# Patient Record
Sex: Female | Born: 1960 | Race: White | Hispanic: No | Smoking: Current some day smoker
Health system: Southern US, Community
[De-identification: ages and names within clinical notes are randomized; demographics above are authoritative.]

## PROBLEM LIST (undated history)

## (undated) DIAGNOSIS — C259 Malignant neoplasm of pancreas, unspecified: Secondary | ICD-10-CM

## (undated) DIAGNOSIS — G709 Myoneural disorder, unspecified: Secondary | ICD-10-CM

## (undated) DIAGNOSIS — F1910 Other psychoactive substance abuse, uncomplicated: Secondary | ICD-10-CM

## (undated) DIAGNOSIS — F419 Anxiety disorder, unspecified: Secondary | ICD-10-CM

## (undated) DIAGNOSIS — F1010 Alcohol abuse, uncomplicated: Secondary | ICD-10-CM

## (undated) DIAGNOSIS — B019 Varicella without complication: Secondary | ICD-10-CM

## (undated) DIAGNOSIS — F32A Depression, unspecified: Secondary | ICD-10-CM

## (undated) DIAGNOSIS — F329 Major depressive disorder, single episode, unspecified: Secondary | ICD-10-CM

## (undated) HISTORY — DX: Anxiety disorder, unspecified: F41.9

## (undated) HISTORY — DX: Varicella without complication: B01.9

## (undated) HISTORY — DX: Myoneural disorder, unspecified: G70.9

## (undated) HISTORY — DX: Major depressive disorder, single episode, unspecified: F32.9

## (undated) HISTORY — PX: TONSILLECTOMY: SUR1361

## (undated) HISTORY — DX: Depression, unspecified: F32.A

---

## 2001-12-27 ENCOUNTER — Encounter: Payer: Self-pay | Admitting: Internal Medicine

## 2001-12-27 ENCOUNTER — Encounter: Admission: RE | Admit: 2001-12-27 | Discharge: 2001-12-27 | Payer: Self-pay | Admitting: Internal Medicine

## 2002-01-31 ENCOUNTER — Encounter: Admission: RE | Admit: 2002-01-31 | Discharge: 2002-01-31 | Payer: Self-pay | Admitting: Family Medicine

## 2002-01-31 ENCOUNTER — Encounter: Payer: Self-pay | Admitting: Family Medicine

## 2007-01-12 ENCOUNTER — Ambulatory Visit (HOSPITAL_COMMUNITY): Admission: RE | Admit: 2007-01-12 | Discharge: 2007-01-12 | Payer: Self-pay | Admitting: Obstetrics and Gynecology

## 2009-05-01 ENCOUNTER — Ambulatory Visit: Payer: Self-pay | Admitting: Diagnostic Radiology

## 2009-05-01 ENCOUNTER — Emergency Department (HOSPITAL_BASED_OUTPATIENT_CLINIC_OR_DEPARTMENT_OTHER): Admission: EM | Admit: 2009-05-01 | Discharge: 2009-05-01 | Payer: Self-pay | Admitting: Emergency Medicine

## 2010-01-23 ENCOUNTER — Emergency Department (HOSPITAL_COMMUNITY)
Admission: EM | Admit: 2010-01-23 | Discharge: 2010-01-24 | Payer: Self-pay | Source: Home / Self Care | Admitting: Emergency Medicine

## 2010-04-07 LAB — CBC
HCT: 44.2 % (ref 36.0–46.0)
Hemoglobin: 14.6 g/dL (ref 12.0–15.0)
MCH: 33.2 pg (ref 26.0–34.0)
MCHC: 33 g/dL (ref 30.0–36.0)
MCV: 100.5 fL — ABNORMAL HIGH (ref 78.0–100.0)
Platelets: 485 10*3/uL — ABNORMAL HIGH (ref 150–400)
RBC: 4.4 MIL/uL (ref 3.87–5.11)
RDW: 12.8 % (ref 11.5–15.5)
WBC: 9.8 10*3/uL (ref 4.0–10.5)

## 2010-04-07 LAB — COMPREHENSIVE METABOLIC PANEL
ALT: 34 U/L (ref 0–35)
AST: 35 U/L (ref 0–37)
CO2: 25 mEq/L (ref 19–32)
Calcium: 10.6 mg/dL — ABNORMAL HIGH (ref 8.4–10.5)
Creatinine, Ser: 0.77 mg/dL (ref 0.4–1.2)
GFR calc Af Amer: >60 mL/min (ref >60)
GFR calc non Af Amer: >60 mL/min (ref >60)
Sodium: 139 mEq/L (ref 135–145)
Total Protein: 7.6 g/dL (ref 6.0–8.3)

## 2010-04-07 LAB — URINALYSIS, ROUTINE W REFLEX MICROSCOPIC
Bilirubin Urine: NEGATIVE
Hgb urine dipstick: NEGATIVE
Nitrite: NEGATIVE
Protein, ur: NEGATIVE mg/dL
Urobilinogen, UA: 0.2 mg/dL (ref 0.0–1.0)

## 2010-04-07 LAB — DIFFERENTIAL
Eosinophils Absolute: 0 10*3/uL (ref 0.0–0.7)
Eosinophils Relative: 0 % (ref 0–5)
Lymphocytes Relative: 22 % (ref 12–46)
Lymphs Abs: 2.2 10*3/uL (ref 0.7–4.0)
Monocytes Relative: 8 % (ref 3–12)

## 2010-04-07 LAB — RAPID URINE DRUG SCREEN, HOSP PERFORMED
Amphetamines: NOT DETECTED
Barbiturates: NOT DETECTED
Benzodiazepines: POSITIVE — AB
Opiates: NOT DETECTED
Tetrahydrocannabinol: POSITIVE — AB

## 2010-04-07 LAB — ETHANOL: Alcohol, Ethyl (B): <5 mg/dL (ref 0–10)

## 2010-04-16 LAB — URINALYSIS, ROUTINE W REFLEX MICROSCOPIC
Bilirubin Urine: NEGATIVE
Glucose, UA: NEGATIVE mg/dL
Ketones, ur: NEGATIVE mg/dL
Protein, ur: NEGATIVE mg/dL
pH: 6 (ref 5.0–8.0)

## 2010-04-16 LAB — URINE MICROSCOPIC-ADD ON

## 2015-01-04 ENCOUNTER — Encounter: Payer: Self-pay | Admitting: Family Medicine

## 2015-01-04 ENCOUNTER — Ambulatory Visit (INDEPENDENT_AMBULATORY_CARE_PROVIDER_SITE_OTHER): Payer: Self-pay | Admitting: Family Medicine

## 2015-01-04 VITALS — BP 129/93 | HR 106 | Temp 98.6°F | Resp 20 | Ht 63.25 in | Wt 144.8 lb

## 2015-01-04 DIAGNOSIS — R053 Chronic cough: Secondary | ICD-10-CM

## 2015-01-04 DIAGNOSIS — J209 Acute bronchitis, unspecified: Secondary | ICD-10-CM | POA: Insufficient documentation

## 2015-01-04 DIAGNOSIS — Z720 Tobacco use: Secondary | ICD-10-CM | POA: Insufficient documentation

## 2015-01-04 DIAGNOSIS — F418 Other specified anxiety disorders: Secondary | ICD-10-CM | POA: Insufficient documentation

## 2015-01-04 DIAGNOSIS — Z7189 Other specified counseling: Secondary | ICD-10-CM

## 2015-01-04 DIAGNOSIS — F411 Generalized anxiety disorder: Secondary | ICD-10-CM

## 2015-01-04 DIAGNOSIS — Z7689 Persons encountering health services in other specified circumstances: Secondary | ICD-10-CM | POA: Insufficient documentation

## 2015-01-04 DIAGNOSIS — R05 Cough: Secondary | ICD-10-CM

## 2015-01-04 DIAGNOSIS — R059 Cough, unspecified: Secondary | ICD-10-CM | POA: Insufficient documentation

## 2015-01-04 MED ORDER — PREDNISONE 50 MG PO TABS: 50.0000 mg | ORAL_TABLET | Freq: Every day | ORAL | Status: DC

## 2015-01-04 MED ORDER — DOXYCYCLINE HYCLATE 100 MG PO TABS: 100.0000 mg | ORAL_TABLET | Freq: Two times a day (BID) | ORAL | Status: DC

## 2015-01-04 NOTE — Progress Notes (Signed)
Subjective:    Patient ID: Kathryn Underwood, female    DOB: Mar 09, 1960, 54 y.o.   MRN: [id number]  HPI  Patient presents for new patient establishment with complaints of chronic cough. All past medical history, surgical history, allergies, family history, immunizations and social history was obtained from the patient today and entered into the electronic medical record. Records are requested from her prior PCP, and will be reviewed at the time they are received. All medical records will be updated at that time.  No records were reviewed or available prior to OV. Has not seen PCP in many years. Pt will need to return to office for preventive/CPE.   Health maintenance:  Colonoscopy: No colonoscopy, no FHX. Interested in cologuard. Mammogram: mammogram 2008 abnormal, pt states she had additional imaging but there is no records. Cervical cancer screening: last PAP 2012, 1 abnl pap.  Immunizations: tdap 2008, flu shot 2015, no PNA vacs.  Infectious disease screening: Unknown. Declined 2/2 to no insurance.   Chronic cough:started with grandson came home from kindergarten sick in August. Now smoking more as well. Mucinex DM, robitussin DM and no resolution. Now back on Wellbutrin, and that has helped her quit in the past. No fever, chills, sore throat, nasal drip and congestion. Mucinex once a day. Use to get bronchitis in 30's, but has not had it in a while.  Decreased appetite since ill.  Golden  productive cough   Past Medical History  Diagnosis Date  . Depression   . Anxiety   . Neuromuscular disorder (HCC)     neuropathy  . Chickenpox    No Known Allergies Past Surgical History  Procedure Laterality Date  . Cesarean section    . Tonsillectomy     Family History  Problem Relation Age of Onset  . Dementia Mother   . Arthritis Father   . Asthma Father   . Asthma Brother   . Diabetes Maternal Grandmother   . Dementia Maternal Grandmother   . Diabetes Paternal Grandmother   .  Breast cancer Neg Hx   . Colon cancer Neg Hx   . Parkinson's disease Maternal Grandfather   . Parkinson's disease Paternal Grandfather    Social History   Social History  . Marital Status: Divorced    Spouse Name: N/A  . Number of Children: N/A  . Years of Education: N/A   Occupational History  . Not on file.   Social History Main Topics  . Smoking status: Current Every Day Smoker -- 0.50 packs/day    Types: Cigarettes  . Smokeless tobacco: Never Used  . Alcohol Use: 4.8 oz/week    8 Glasses of wine per week     Comment: daily  . Drug Use: No  . Sexual Activity: Yes     Comment: female partner   Other Topics Concern  . Not on file   Social History Narrative   Divorced. Homemaker. HS grad.    Lives with life partner (stan)   Drinks caffeine.   Wears seatbelt. Smoke dectector in the home. No firearms in the home.    Feels safe in her relationships.       Review of Systems Negative, with the exception of above mentioned in HPI     Objective:   Physical Exam BP 129/93 mmHg  Pulse 106  Temp(Src) 98.6 F (37 C) (Oral)  Resp 20  Ht 5' 3.25" (1.607 m)  Wt 144 lb 12 oz (65.658 kg)  BMI 25.42 kg/m2  SpO2  95% Gen: Afebrile. No acute distress. Nontoxic in appearance, well-developed, well-nourished, Caucasian female. Smoker's cough present. HENT: AT. [place]. Bilateral TM visualized shiny/full. MMM, no oral lesions. Bilateral nares with erythematous swelling. Throat without erythema or exudates. Cobblestoning present. Harsh cough present. No hoarseness on exam. No tenderness to palpation facial sinuses. Eyes:Pupils Equal Round Reactive to light, Extraocular movements intact,  Conjunctiva without redness, discharge or icterus. Neck/lymp/endocrine: Supple, mild anterior cervical lymphadenopathy, no thyromegaly CV: RRR , no lower extremity edema Chest: CTAB, no wheeze or crackles, good air movement, normal respiratory effort Abd: Soft. Flat. NTND. BS present. No Masses palpated.    MSK: No erythema, soft tissue swelling or obvious deformities. Full range of motion. Skin: No rashes, purpura or petechiae.  Neuro:  Normal gait. PERLA. EOMi. Alert. Oriented. Cranial nerves II through XII intact. Muscle strength 5/5 upper and lower extremity. DTRs equal bilaterally. Psych: Normal affect, dress and demeanor. Normal speech. Normal thought content and judgment..      Assessment & Plan:  Kathryn Underwood is a 54 y.o. female presents for establishment of care with complaints of acute on chronic cough.  - Pt will meed appt once she has insurance for CPE and labs, ans discussion on health maintenance and catch up on immunizations.  - Needs mammogram, no insurance. Will attempt to apply her for a scholarship for mammograms.  - Needs colo-guard screening or colonoscopy, will wait unti insurance coverage - anxiety: continue with psych.  Chronic cough, acute on chronic bronchitis/Tobacco abuse:  - Smoking cessation provided. Encouraged to quit.  - Antibiotic, flonase, steroids, antihistamine.  - predniSONE (DELTASONE) 50 MG tablet; Take 1 tablet (50 mg total) by mouth daily with breakfast.  Dispense: 5 tablet; Refill: 0 - doxycycline (VIBRA-TABS) 100 MG tablet; Take 1 tablet (100 mg total) by mouth 2 (two) times daily.  Dispense: 20 tablet; Refill: 0

## 2015-01-04 NOTE — Patient Instructions (Signed)
Over the counter meds: Antihistamine (zyrtec or allegra) , flonase, mucinex prednisone burst and doxycycline prescribed.  Follow up for preventive/physical once when you get insurance  Acute Bronchitis Bronchitis is inflammation of the airways that extend from the windpipe into the lungs (bronchi). The inflammation often causes mucus to develop. This leads to a cough, which is the most common symptom of bronchitis.  In acute bronchitis, the condition usually develops suddenly and goes away over time, usually in a couple weeks. Smoking, allergies, and asthma can make bronchitis worse. Repeated episodes of bronchitis may cause further lung problems.  CAUSES Acute bronchitis is most often caused by the same virus that causes a cold. The virus can spread from person to person (contagious) through coughing, sneezing, and touching contaminated objects. SIGNS AND SYMPTOMS   Cough.   Fever.   Coughing up mucus.   Body aches.   Chest congestion.   Chills.   Shortness of breath.   Sore throat.  DIAGNOSIS  Acute bronchitis is usually diagnosed through a physical exam. Your health care provider will also ask you questions about your medical history. Tests, such as chest X-rays, are sometimes done to rule out other conditions.  TREATMENT  Acute bronchitis usually goes away in a couple weeks. Oftentimes, no medical treatment is necessary. Medicines are sometimes given for relief of fever or cough. Antibiotic medicines are usually not needed but may be prescribed in certain situations. In some cases, an inhaler may be recommended to help reduce shortness of breath and control the cough. A cool mist vaporizer may also be used to help thin bronchial secretions and make it easier to clear the chest.  HOME CARE INSTRUCTIONS  Get plenty of rest.   Drink enough fluids to keep your urine clear or pale yellow (unless you have a medical condition that requires fluid restriction). Increasing fluids  may help thin your respiratory secretions (sputum) and reduce chest congestion, and it will prevent dehydration.   Take medicines only as directed by your health care provider.  If you were prescribed an antibiotic medicine, finish it all even if you start to feel better.  Avoid smoking and secondhand smoke. Exposure to cigarette smoke or irritating chemicals will make bronchitis worse. If you are a smoker, consider using nicotine gum or skin patches to help control withdrawal symptoms. Quitting smoking will help your lungs heal faster.   Reduce the chances of another bout of acute bronchitis by washing your hands frequently, avoiding people with cold symptoms, and trying not to touch your hands to your mouth, nose, or eyes.   Keep all follow-up visits as directed by your health care provider.  SEEK MEDICAL CARE IF: Your symptoms do not improve after 1 week of treatment.  SEEK IMMEDIATE MEDICAL CARE IF:  You develop an increased fever or chills.   You have chest pain.   You have severe shortness of breath.  You have bloody sputum.   You develop dehydration.  You faint or repeatedly feel like you are going to pass out.  You develop repeated vomiting.  You develop a severe headache. MAKE SURE YOU:   Understand these instructions.  Will watch your condition.  Will get help right away if you are not doing well or get worse.   This information is not intended to replace advice given to you by your health care provider. Make sure you discuss any questions you have with your health care provider.   Document Released: 02/20/2004 Document Revised: 02/02/2014 Document Reviewed:  07/05/2012 Elsevier Interactive Patient Education 2016 [place] Maintenance, Female Adopting a healthy lifestyle and getting preventive care can go a long way to promote health and wellness. Talk with your health care provider about what schedule of regular examinations is right for  you. This is a good chance for you to check in with your provider about disease prevention and staying healthy. In between checkups, there are plenty of things you can do on your own. Experts have done a lot of research about which lifestyle changes and preventive measures are most likely to keep you healthy. Ask your health care provider for more information. WEIGHT AND DIET  Eat a healthy diet  Be sure to include plenty of vegetables, fruits, low-fat dairy products, and lean protein.  Do not eat a lot of foods high in solid fats, added sugars, or salt.  Get regular exercise. This is one of the most important things you can do for your health.  Most adults should exercise for at least 150 minutes each week. The exercise should increase your heart rate and make you sweat (moderate-intensity exercise).  Most adults should also do strengthening exercises at least twice a week. This is in addition to the moderate-intensity exercise.  Maintain a healthy weight  Body mass index (BMI) is a measurement that can be used to identify possible weight problems. It estimates body fat based on height and weight. Your health care provider can help determine your BMI and help you achieve or maintain a healthy weight.  For females 71 years of age and older:   A BMI below 18.5 is considered underweight.  A BMI of 18.5 to 24.9 is normal.  A BMI of 25 to 29.9 is considered overweight.  A BMI of 30 and above is considered obese.  Watch levels of cholesterol and blood lipids  You should start having your blood tested for lipids and cholesterol at 55 years of age, then have this test every 5 years.  You may need to have your cholesterol levels checked more often if:  Your lipid or cholesterol levels are high.  You are older than 55 years of age.  You are at high risk for heart disease.  CANCER SCREENING   Lung Cancer  Lung cancer screening is recommended for adults 36-97 years old who are at  high risk for lung cancer because of a history of smoking.  A yearly low-dose CT scan of the lungs is recommended for people who:  Currently smoke.  Have quit within the past 15 years.  Have at least a 30-pack-year history of smoking. A pack year is smoking an average of one pack of cigarettes a day for 1 year.  Yearly screening should continue until it has been 15 years since you quit.  Yearly screening should stop if you develop a health problem that would prevent you from having lung cancer treatment.  Breast Cancer  Practice breast self-awareness. This means understanding how your breasts normally appear and feel.  It also means doing regular breast self-exams. Let your health care provider know about any changes, no matter how small.  If you are in your 20s or 30s, you should have a clinical breast exam (CBE) by a health care provider every 1-3 years as part of a regular health exam.  If you are 51 or older, have a CBE every year. Also consider having a breast X-ray (mammogram) every year.  If you have a family history of breast cancer, talk  to your health care provider about genetic screening.  If you are at high risk for breast cancer, talk to your health care provider about having an MRI and a mammogram every year.  Breast cancer gene (BRCA) assessment is recommended for women who have family members with BRCA-related cancers. BRCA-related cancers include:  Breast.  Ovarian.  Tubal.  Peritoneal cancers.  Results of the assessment will determine the need for genetic counseling and BRCA1 and BRCA2 testing. Cervical Cancer Your health care provider may recommend that you be screened regularly for cancer of the pelvic organs (ovaries, uterus, and vagina). This screening involves a pelvic examination, including checking for microscopic changes to the surface of your cervix (Pap test). You may be encouraged to have this screening done every 3 years, beginning at age  37.  For women ages 27-65, health care providers may recommend pelvic exams and Pap testing every 3 years, or they may recommend the Pap and pelvic exam, combined with testing for human papilloma virus (HPV), every 5 years. Some types of HPV increase your risk of cervical cancer. Testing for HPV may also be done on women of any age with unclear Pap test results.  Other health care providers may not recommend any screening for nonpregnant women who are considered low risk for pelvic cancer and who do not have symptoms. Ask your health care provider if a screening pelvic exam is right for you.  If you have had past treatment for cervical cancer or a condition that could lead to cancer, you need Pap tests and screening for cancer for at least 20 years after your treatment. If Pap tests have been discontinued, your risk factors (such as having a new sexual partner) need to be reassessed to determine if screening should resume. Some women have medical problems that increase the chance of getting cervical cancer. In these cases, your health care provider may recommend more frequent screening and Pap tests. Colorectal Cancer  This type of cancer can be detected and often prevented.  Routine colorectal cancer screening usually begins at 55 years of age and continues through 55 years of age.  Your health care provider may recommend screening at an earlier age if you have risk factors for colon cancer.  Your health care provider may also recommend using home test kits to check for hidden blood in the stool.  A small camera at the end of a tube can be used to examine your colon directly (sigmoidoscopy or colonoscopy). This is done to check for the earliest forms of colorectal cancer.  Routine screening usually begins at age 48.  Direct examination of the colon should be repeated every 5-10 years through 55 years of age. However, you may need to be screened more often if early forms of precancerous polyps  or small growths are found. Skin Cancer  Check your skin from head to toe regularly.  Tell your health care provider about any new moles or changes in moles, especially if there is a change in a mole's shape or color.  Also tell your health care provider if you have a mole that is larger than the size of a pencil eraser.  Always use sunscreen. Apply sunscreen liberally and repeatedly throughout the day.  Protect yourself by wearing long sleeves, pants, a wide-brimmed hat, and sunglasses whenever you are outside. HEART DISEASE, DIABETES, AND HIGH BLOOD PRESSURE   High blood pressure causes heart disease and increases the risk of stroke. High blood pressure is more likely to develop  in:  People who have blood pressure in the high end of the normal range (130-139/85-89 mm Hg).  People who are overweight or obese.  People who are African American.  If you are 52-3 years of age, have your blood pressure checked every 3-5 years. If you are 77 years of age or older, have your blood pressure checked every year. You should have your blood pressure measured twice--once when you are at a hospital or clinic, and once when you are not at a hospital or clinic. Record the average of the two measurements. To check your blood pressure when you are not at a hospital or clinic, you can use:  An automated blood pressure machine at a pharmacy.  A home blood pressure monitor.  If you are between 36 years and 74 years old, ask your health care provider if you should take aspirin to prevent strokes.  Have regular diabetes screenings. This involves taking a blood sample to check your fasting blood sugar level.  If you are at a normal weight and have a low risk for diabetes, have this test once every three years after 55 years of age.  If you are overweight and have a high risk for diabetes, consider being tested at a younger age or more often. PREVENTING INFECTION  Hepatitis B  If you have a higher  risk for hepatitis B, you should be screened for this virus. You are considered at high risk for hepatitis B if:  You were born in a country where hepatitis B is common. Ask your health care provider which countries are considered high risk.  Your parents were born in a high-risk country, and you have not been immunized against hepatitis B (hepatitis B vaccine).  You have HIV or AIDS.  You use needles to inject street drugs.  You live with someone who has hepatitis B.  You have had sex with someone who has hepatitis B.  You get hemodialysis treatment.  You take certain medicines for conditions, including cancer, organ transplantation, and autoimmune conditions. Hepatitis C  Blood testing is recommended for:  Everyone born from [birth year] through 1966.  Anyone with known risk factors for hepatitis C. Sexually transmitted infections (STIs)  You should be screened for sexually transmitted infections (STIs) including gonorrhea and chlamydia if:  You are sexually active and are younger than 55 years of age.  You are older than 55 years of age and your health care provider tells you that you are at risk for this type of infection.  Your sexual activity has changed since you were last screened and you are at an increased risk for chlamydia or gonorrhea. Ask your health care provider if you are at risk.  If you do not have HIV, but are at risk, it may be recommended that you take a prescription medicine daily to prevent HIV infection. This is called pre-exposure prophylaxis (PrEP). You are considered at risk if:  You are sexually active and do not regularly use condoms or know the HIV status of your partner(s).  You take drugs by injection.  You are sexually active with a partner who has HIV. Talk with your health care provider about whether you are at high risk of being infected with HIV. If you choose to begin PrEP, you should first be tested for HIV. You should then be tested every 3  months for as long as you are taking PrEP.  PREGNANCY   If you are premenopausal and you may become pregnant, ask  your health care provider about preconception counseling.  If you may become pregnant, take 400 to 800 micrograms (mcg) of folic acid every day.  If you want to prevent pregnancy, talk to your health care provider about birth control (contraception). OSTEOPOROSIS AND MENOPAUSE   Osteoporosis is a disease in which the bones lose minerals and strength with aging. This can result in serious bone fractures. Your risk for osteoporosis can be identified using a bone density scan.  If you are 74 years of age or older, or if you are at risk for osteoporosis and fractures, ask your health care provider if you should be screened.  Ask your health care provider whether you should take a calcium or vitamin D supplement to lower your risk for osteoporosis.  Menopause may have certain physical symptoms and risks.  Hormone replacement therapy may reduce some of these symptoms and risks. Talk to your health care provider about whether hormone replacement therapy is right for you.  HOME CARE INSTRUCTIONS   Schedule regular health, dental, and eye exams.  Stay current with your immunizations.   Do not use any tobacco products including cigarettes, chewing tobacco, or electronic cigarettes.  If you are pregnant, do not drink alcohol.  If you are breastfeeding, limit how much and how often you drink alcohol.  Limit alcohol intake to no more than 1 drink per day for nonpregnant women. One drink equals 12 ounces of beer, 5 ounces of wine, or 1 ounces of hard liquor.  Do not use street drugs.  Do not share needles.  Ask your health care provider for help if you need support or information about quitting drugs.  Tell your health care provider if you often feel depressed.  Tell your health care provider if you have ever been abused or do not feel safe at home.   This information is  not intended to replace advice given to you by your health care provider. Make sure you discuss any questions you have with your health care provider.   Document Released: 07/28/2010 Document Revised: 02/02/2014 Document Reviewed: 12/14/2012 Elsevier Interactive Patient Education 2016 [employer].  Smoking Cessation, Tips for Success If you are ready to quit smoking, congratulations! You have chosen to help yourself be healthier. Cigarettes bring nicotine, tar, carbon monoxide, and other irritants into your body. Your lungs, heart, and blood vessels will be able to work better without these poisons. There are many different ways to quit smoking. Nicotine gum, nicotine patches, a nicotine inhaler, or nicotine nasal spray can help with physical craving. Hypnosis, support groups, and medicines help break the habit of smoking. WHAT THINGS CAN I DO TO MAKE QUITTING EASIER?  Here are some tips to help you quit for good:  Pick a date when you will quit smoking completely. Tell all of your friends and family about your plan to quit on that date.  Do not try to slowly cut down on the number of cigarettes you are smoking. Pick a quit date and quit smoking completely starting on that day.  Throw away all cigarettes.   Clean and remove all ashtrays from your home, work, and car.  On a card, write down your reasons for quitting. Carry the card with you and read it when you get the urge to smoke.  Cleanse your body of nicotine. Drink enough water and fluids to keep your urine clear or pale yellow. Do this after quitting to flush the nicotine from your body.  Learn to predict your moods.  Do not let a bad situation be your excuse to have a cigarette. Some situations in your life might tempt you into wanting a cigarette.  Never have "just one" cigarette. It leads to wanting another and another. Remind yourself of your decision to quit.  Change habits associated with smoking. If you smoked while driving  or when feeling stressed, try other activities to replace smoking. Stand up when drinking your coffee. Brush your teeth after eating. Sit in a different chair when you read the paper. Avoid alcohol while trying to quit, and try to drink fewer caffeinated beverages. Alcohol and caffeine may urge you to smoke.  Avoid foods and drinks that can trigger a desire to smoke, such as sugary or spicy foods and alcohol.  Ask people who smoke not to smoke around you.  Have something planned to do right after eating or having a cup of coffee. For example, plan to take a walk or exercise.  Try a relaxation exercise to calm you down and decrease your stress. Remember, you may be tense and nervous for the first 2 weeks after you quit, but this will pass.  Find new activities to keep your hands busy. Play with a pen, coin, or rubber band. Doodle or draw things on paper.  Brush your teeth right after eating. This will help cut down on the craving for the taste of tobacco after meals. You can also try mouthwash.   Use oral substitutes in place of cigarettes. Try using lemon drops, carrots, cinnamon sticks, or chewing gum. Keep them handy so they are available when you have the urge to smoke.  When you have the urge to smoke, try deep breathing.  Designate your home as a nonsmoking area.  If you are a heavy smoker, ask your health care provider about a prescription for nicotine chewing gum. It can ease your withdrawal from nicotine.  Reward yourself. Set aside the cigarette money you save and buy yourself something nice.  Look for support from others. Join a support group or smoking cessation program. Ask someone at home or at work to help you with your plan to quit smoking.  Always ask yourself, "Do I need this cigarette or is this just a reflex?" Tell yourself, "Today, I choose not to smoke," or "I do not want to smoke." You are reminding yourself of your decision to quit.  Do not replace cigarette  smoking with electronic cigarettes (commonly called e-cigarettes). The safety of e-cigarettes is unknown, and some may contain harmful chemicals.  If you relapse, do not give up! Plan ahead and think about what you will do the next time you get the urge to smoke. HOW WILL I FEEL WHEN I QUIT SMOKING? You may have symptoms of withdrawal because your body is used to nicotine (the addictive substance in cigarettes). You may crave cigarettes, be irritable, feel very hungry, cough often, get headaches, or have difficulty concentrating. The withdrawal symptoms are only temporary. They are strongest when you first quit but will go away within 10-14 days. When withdrawal symptoms occur, stay in control. Think about your reasons for quitting. Remind yourself that these are signs that your body is healing and getting used to being without cigarettes. Remember that withdrawal symptoms are easier to treat than the major diseases that smoking can cause.  Even after the withdrawal is over, expect periodic urges to smoke. However, these cravings are generally short lived and will go away whether you smoke or not. Do not smoke! WHAT  RESOURCES ARE AVAILABLE TO HELP ME QUIT SMOKING? Your health care provider can direct you to community resources or hospitals for support, which may include:  Group support.  Education.  Hypnosis.  Therapy.   This information is not intended to replace advice given to you by your health care provider. Make sure you discuss any questions you have with your health care provider.   Document Released: 10/11/2003 Document Revised: 02/02/2014 Document Reviewed: 06/30/2012 Elsevier Interactive Patient Education [employer].

## 2015-02-06 ENCOUNTER — Telehealth: Payer: Self-pay | Admitting: *Deleted

## 2015-02-06 NOTE — Telephone Encounter (Signed)
Left message for patient with information.

## 2015-02-06 NOTE — Telephone Encounter (Signed)
Kathryn Underwood called and is requesting Gabapentin she states she takes this for anxiety it is Rx'd by Dr Erling [facility] psych but she states she hasnt been able to get through to them. Please advise.

## 2015-02-06 NOTE — Telephone Encounter (Signed)
Please call patient: I would prefer her to get her gabapentin through her psychiatry provider. They have likely been closed secondary to weather related conditions. If patient is out of medication, the pharmacy will usually provide a few days emergency prescription until her prescription comes through her psychiatry provider. He would not be appropriate for me to provide medications for something I am not evaluating her for. - If she is completely out of medications, and pharmacy will not supply her with an emergency extension, I will provide her with a few days until she is able to get ahold of her psychiatry provider. If this is the case, then we'll provide one week prescription.

## 2015-12-09 ENCOUNTER — Ambulatory Visit (INDEPENDENT_AMBULATORY_CARE_PROVIDER_SITE_OTHER): Payer: Self-pay | Admitting: Family Medicine

## 2015-12-09 ENCOUNTER — Encounter: Payer: Self-pay | Admitting: Family Medicine

## 2015-12-09 VITALS — BP 141/96 | HR 100 | Temp 98.5°F | Resp 20 | Wt 149.2 lb

## 2015-12-09 DIAGNOSIS — Z7141 Alcohol abuse counseling and surveillance of alcoholic: Secondary | ICD-10-CM | POA: Insufficient documentation

## 2015-12-09 DIAGNOSIS — F1029 Alcohol dependence with unspecified alcohol-induced disorder: Secondary | ICD-10-CM | POA: Insufficient documentation

## 2015-12-09 DIAGNOSIS — F418 Other specified anxiety disorders: Secondary | ICD-10-CM

## 2015-12-09 DIAGNOSIS — R5383 Other fatigue: Secondary | ICD-10-CM

## 2015-12-09 MED ORDER — LORAZEPAM 1 MG PO TABS: 1.0000 mg | ORAL_TABLET | Freq: Three times a day (TID) | ORAL | 0 refills | Status: DC | PRN

## 2015-12-09 MED ORDER — ESCITALOPRAM OXALATE 10 MG PO TABS: 10.0000 mg | ORAL_TABLET | Freq: Every day | ORAL | 0 refills | Status: DC

## 2015-12-09 NOTE — Patient Instructions (Addendum)
Start ativan every 8 hours for the next 5 days. If it makes you tired try a half tab instead. On day 5 (after stop drinking) cut back to every 12 hours for medicine and then on day 10 only as needed. Eventually will not take any of the ativan.  Start lexapro 1 tab daily today.  Continue Wellbutrin.    [phone] for AA meetings in the area. Call and create a schedule for meetings. Go to meeting as soon as you quit drinking.  Follow up in 1-2 weeks.     Alcohol Abuse and Nutrition Alcohol abuse is any pattern of alcohol consumption that harms your health, relationships, or work. Alcohol abuse can affect how your body breaks down and absorbs nutrients from food by causing your liver to work abnormally. Additionally, many people who abuse alcohol do not eat enough carbohydrates, protein, fat, vitamins, and minerals. This can cause poor nutrition (malnutrition) and a lack of nutrients (nutrient deficiencies), which can lead to further complications. Nutrients that are commonly lacking (deficient) among people who abuse alcohol include:  Vitamins.  Vitamin A. This is stored in your liver. It is important for your vision, metabolism, and ability to fight off infections (immunity).  B vitamins. These include vitamins such as folate, thiamin, and niacin. These are important in new cell growth and maintenance.  Vitamin C. This plays an important role in iron absorption, wound healing, and immunity.  Vitamin D. This is produced by your liver, but you can also get vitamin D from food. Vitamin D is necessary for your body to absorb and use calcium.  Minerals.  Calcium. This is important for your bones and your heart and blood vessel (cardiovascular) function.  Iron. This is important for blood, muscle, and nervous system functioning.  Magnesium. This plays an important role in muscle and nerve function, and it helps to control blood sugar and blood pressure.  Zinc. This is important for the  normal function of your nervous system and digestive system (gastrointestinal tract). Nutrition is an essential component of therapy for alcohol abuse. Your health care provider or dietitian will work with you to design a plan that can help restore nutrients to your body and prevent potential complications. WHAT IS MY PLAN? Your dietitian may develop a specific diet plan that is based on your condition and any other complications you may have. A diet plan will commonly include:  A balanced diet.  Grains: 6-8 oz per day.  Vegetables: 2-3 cups per day.  Fruits: 1-2 cups per day.  Meat and other protein: 5-6 oz per day.  Dairy: 2-3 cups per day.  Vitamin and mineral supplements. WHAT DO I NEED TO KNOW ABOUT ALCOHOL AND NUTRITION?  Consume foods that are high in antioxidants, such as grapes, berries, nuts, green tea, and dark green and orange vegetables. This can help to counteract some of the stress that is placed on your liver by consuming alcohol.  Avoid food and drinks that are high in fat and sugar. Foods such as sugared soft drinks, salty snack foods, and candy contain empty calories. This means that they lack important nutrients such as protein, fiber, and vitamins.  Eat frequent meals and snacks. Try to eat 5-6 small meals each day.  Eat a variety of fresh fruits and vegetables each day. This will help you get plenty of water, fiber, and vitamins in your diet.  Drink plenty of water and other clear fluids. Try to drink at least 48-64 oz (1.5-2  L) of water per day.  If you are a vegetarian, eat a variety of protein-rich foods. Pair whole grains with plant-based proteins at meals and snacks to obtain the greatest nutrient benefit from your food. For example, eat rice with beans, put peanut butter on whole-grain toast, or eat oatmeal with sunflower seeds.  Soak beans and whole grains overnight before cooking. This can help your body to absorb the nutrients more easily.  Include  foods fortified with vitamins and minerals in your diet. Commonly fortified foods include milk, orange juice, cereal, and bread.  If you are malnourished, your dietitian may recommend a high-protein, high-calorie diet. This may include:  2,000-3,000 calories (kilocalories) per day.  70-100 grams of protein per day.  Your health care provider may recommend a complete nutritional supplement beverage. This can help to restore calories, protein, and vitamins to your body. Depending on your condition, you may be advised to consume this instead of or in addition to meals.  Limit your intake of caffeine. Replace drinks like coffee and black tea with decaffeinated coffee and herbal tea.  Eat a variety of foods that are high in omega fatty acids. These include fish, nuts and seeds, and soybeans. These foods may help your liver to recover and may also stabilize your mood.  Certain medicines may cause changes in your appetite, taste, and weight. Work with your health care provider and dietitian to make any adjustments to your medicines and diet plan.  Include other healthy lifestyle choices in your daily routine.  Be physically active.  Get enough sleep.  Spend time doing activities that you enjoy.  If you are unable to take in enough food and calories by mouth, your health care provider may recommend a feeding tube. This is a tube that passes through your nose and throat, directly into your stomach. Nutritional supplement beverages can be given to you through the feeding tube to help you get the nutrients you need.  Take vitamin or mineral supplements as recommended by your health care provider. WHAT FOODS CAN I EAT? Grains Enriched pasta. Enriched rice. Fortified whole-grain bread. Fortified whole-grain cereal. Barley. Brown rice. Quinoa. [place]. Vegetables All fresh, frozen, and canned vegetables. Spinach. Kale. Artichoke. Carrots. Winter squash and pumpkin. Sweet potatoes. Broccoli. Cabbage.  Cucumbers. Tomatoes. Sweet peppers. Green beans. Peas. Corn. Fruits All fresh and frozen fruits. Berries. Grapes. Mango. Papaya. Guava. Cherries. Apples. Bananas. Peaches. Plums. Pineapple. Watermelon. Cantaloupe. Oranges. Avocado. Meats and Other Protein Sources Beef liver. Lean beef. Pork. Fresh and canned chicken. Fresh fish. Oysters. Sardines. Canned tuna. Shrimp. Eggs with yolks. Nuts and seeds. Peanut butter. Beans and lentils. Soybeans. Tofu. Dairy Whole, low-fat, and nonfat milk. Whole, low-fat, and nonfat yogurt. Cottage cheese. Sour cream. Hard and soft cheeses. Beverages Water. Herbal tea. Decaffeinated coffee. Decaffeinated green tea. 100% fruit juice. 100% vegetable juice. Instant breakfast shakes. Condiments Ketchup. Mayonnaise. Mustard. Salad dressing. Barbecue sauce. Sweets and Desserts Sugar-free ice cream. Sugar-free pudding. Sugar-free gelatin. Fats and Oils Butter. Vegetable oil, flaxseed oil, olive oil, and walnut oil. Other Complete nutrition shakes. Protein bars. Sugar-free gum. The items listed above may not be a complete list of recommended foods or beverages. Contact your dietitian for more options. WHAT FOODS ARE NOT RECOMMENDED? Grains Sugar-sweetened breakfast cereals. Flavored instant oatmeal. Fried breads. Vegetables Breaded or deep-fried vegetables. Fruits Dried fruit with added sugar. Candied fruit. Canned fruit in syrup. Meats and Other Protein Sources Breaded or deep-fried meats. Dairy Flavored milks. Fried cheese curds or fried cheese sticks. Beverages  Alcohol. Sugar-sweetened soft drinks. Sugar-sweetened tea. Caffeinated coffee and tea. Condiments Sugar. Honey. Agave nectar. Molasses. Sweets and Desserts Chocolate. Cake. Cookies. Candy. Other Potato chips. Pretzels. Salted nuts. Candied nuts. The items listed above may not be a complete list of foods and beverages to avoid. Contact your dietitian for more information.   This information is  not intended to replace advice given to you by your health care provider. Make sure you discuss any questions you have with your health care provider.   Document Released: 11/06/2004 Document Revised: 02/02/2014 Document Reviewed: 08/15/2013 Elsevier Interactive Patient Education 2016 [employer].   Alcohol Use Disorder Alcohol use disorder is a mental disorder. It is not a one-time incident of heavy drinking. Alcohol use disorder is the excessive and uncontrollable use of alcohol over time that leads to problems with functioning in one or more areas of daily living. People with this disorder risk harming themselves and others when they drink to excess. Alcohol use disorder also can cause other mental disorders, such as mood and anxiety disorders, and serious physical problems. People with alcohol use disorder often misuse other drugs.  Alcohol use disorder is common and widespread. Some people with this disorder drink alcohol to cope with or escape from negative life events. Others drink to relieve chronic pain or symptoms of mental illness. People with a family history of alcohol use disorder are at higher risk of losing control and using alcohol to excess.  Drinking too much alcohol can cause injury, accidents, and health problems. One drink can be too much when you are:  Working.  Pregnant or breastfeeding.  Taking medicines. Ask your doctor.  Driving or planning to drive. SYMPTOMS  Signs and symptoms of alcohol use disorder may include the following:   Consumption ofalcohol inlarger amounts or over a longer period of time than intended.  Multiple unsuccessful attempts to cutdown or control alcohol use.   A great deal of time spent obtaining alcohol, using alcohol, or recovering from the effects of alcohol (hangover).  A strong desire or urge to use alcohol (cravings).   Continued use of alcohol despite problems at work, school, or home because of alcohol use.   Continued  use of alcohol despite problems in relationships because of alcohol use.  Continued use of alcohol in situations when it is physically hazardous, such as driving a car.  Continued use of alcohol despite awareness of a physical or psychological problem that is likely related to alcohol use. Physical problems related to alcohol use can involve the brain, heart, liver, stomach, and intestines. Psychological problems related to alcohol use include intoxication, depression, anxiety, psychosis, delirium, and dementia.   The need for increased amounts of alcohol to achieve the same desired effect, or a decreased effect from the consumption of the same amount of alcohol (tolerance).  Withdrawal symptoms upon reducing or stopping alcohol use, or alcohol use to reduce or avoid withdrawal symptoms. Withdrawal symptoms include:  Racing heart.  Hand tremor.  Difficulty sleeping.  Nausea.  Vomiting.  Hallucinations.  Restlessness.  Seizures. DIAGNOSIS Alcohol use disorder is diagnosed through an assessment by your health care provider. Your health care provider may start by asking three or four questions to screen for excessive or problematic alcohol use. To confirm a diagnosis of alcohol use disorder, at least two symptoms must be present within a 27-month period. The severity of alcohol use disorder depends on the number of symptoms:  Mild--two or three.  Moderate--four or five.  Severe--six or more.  Your health care provider may perform a physical exam or use results from lab tests to see if you have physical problems resulting from alcohol use. Your health care provider may refer you to a mental health professional for evaluation. TREATMENT  Some people with alcohol use disorder are able to reduce their alcohol use to low-risk levels. Some people with alcohol use disorder need to quit drinking alcohol. When necessary, mental health professionals with specialized training in substance use  treatment can help. Your health care provider can help you decide how severe your alcohol use disorder is and what type of treatment you need. The following forms of treatment are available:   Detoxification. Detoxification involves the use of prescription medicines to prevent alcohol withdrawal symptoms in the first week after quitting. This is important for people with a history of symptoms of withdrawal and for heavy drinkers who are likely to have withdrawal symptoms. Alcohol withdrawal can be dangerous and, in severe cases, cause death. Detoxification is usually provided in a hospital or in-patient substance use treatment facility.  Counseling or talk therapy. Talk therapy is provided by substance use treatment counselors. It addresses the reasons people use alcohol and ways to keep them from drinking again. The goals of talk therapy are to help people with alcohol use disorder find healthy activities and ways to cope with life stress, to identify and avoid triggers for alcohol use, and to handle cravings, which can cause relapse.  Medicines.Different medicines can help treat alcohol use disorder through the following actions:  Decrease alcohol cravings.  Decrease the positive reward response felt from alcohol use.  Produce an uncomfortable physical reaction when alcohol is used (aversion therapy).  Support groups. Support groups are run by people who have quit drinking. They provide emotional support, advice, and guidance. These forms of treatment are often combined. Some people with alcohol use disorder benefit from intensive combination treatment provided by specialized substance use treatment centers. Both inpatient and outpatient treatment programs are available.   This information is not intended to replace advice given to you by your health care provider. Make sure you discuss any questions you have with your health care provider.   Document Released: 02/20/2004 Document Revised:  02/02/2014 Document Reviewed: 04/21/2012 Elsevier Interactive Patient Education [employer].

## 2015-12-09 NOTE — Progress Notes (Signed)
Kathryn Underwood , August 29, 1960, 55 y.o., female MRN: [id number] Patient Care Team    Relationship Specialty Notifications Start End  Kathryn Hillock, DO PCP - General Family Medicine  01/04/15   Kathryn Colonel, MD Referring Physician Psychiatry  12/09/15     CC: fatigue Subjective: Pt presents for an acute OV with complaints of "fatigue". She has not had insurance coverage for sometime and therefore not scheduled CPE. She is followed by Psychiatry yearly for depression and anxiety and prescribed Wellbutrin 200 mg daily. She reports she has been on this medication for a very long time. She was tried in September on prozac 10 mg, and did not feel it was helpful. She states the last 6 weeks she has had no motivation. She is sleeping more often to escape. She is drinking " too much" and she wants to quit. She reports her drinking is causing problems with the relationship of her son. He will not speak to her anymore and they live together. She reports she tried to quit drinking a few years ago and went through rehab for a few weeks. She did go to [facility], but found them difficult to go to since she was having difficulty quitting and felt bad standing up to tell the group she drank again. She currently drinks about 1 "large" bottle of wine a day.  She states she does feel guilty about her drinking. She feels she needs alcohol to cope. She does not need an "eye opener daily". She states she does have symptoms of feeling flushed, rapid heartbeat, shortness of breath, palpitaitons and extreme fatigue after trying to be active. She endorses not eating much meat products. Hot, rapid heartbeat.   Negative Mood disorder screen.   Depression screen [facility] 2/9 12/09/2015 01/04/2015  Decreased Interest 3 1  Down, Depressed, Hopeless 3 1  PHQ - 2 Score 6 2  Altered sleeping 3 1  Tired, decreased energy 3 2  Change in appetite 3 1  Feeling bad or failure about yourself  3 2  Trouble concentrating 1 0  Moving slowly  or fidgety/restless 0 0  Suicidal thoughts 1 0  PHQ-9 Score 20 8  Difficult doing work/chores - Not difficult at all    GAD 7 : Generalized Anxiety Score 12/09/2015  Nervous, Anxious, on Edge 3  Control/stop worrying 3  Worry too much - different things 3  Trouble relaxing 3  Restless 0  Easily annoyed or irritable 0  Afraid - awful might happen 2  Total GAD 7 Score 14  Anxiety Difficulty Somewhat difficult      No Known Allergies Social History  Substance Use Topics  . Smoking status: Current Every Day Smoker    Packs/day: 0.50    Types: Cigarettes  . Smokeless tobacco: Never Used  . Alcohol use 4.8 oz/week    8 Glasses of wine per week     Comment: daily   Past Medical History:  Diagnosis Date  . Anxiety   . Chickenpox   . Depression   . Neuromuscular disorder ([place])    neuropathy   Past Surgical History:  Procedure Laterality Date  . CESAREAN SECTION    . TONSILLECTOMY     Family History  Problem Relation Age of Onset  . Dementia Mother   . Arthritis Father   . Asthma Father   . Asthma Brother   . Diabetes Maternal Grandmother   . Dementia Maternal Grandmother   . Diabetes Paternal Grandmother   .  Parkinson's disease Maternal Grandfather   . Parkinson's disease Paternal Grandfather   . Breast cancer Neg Hx   . Colon cancer Neg Hx      Medication List       Accurate as of 12/09/15  5:38 PM. Always use your most recent med list.          buPROPion 200 MG 12 hr tablet Commonly known as:  WELLBUTRIN SR Take 1 tablet by mouth 2 (two) times daily after a meal.   escitalopram 10 MG tablet Commonly known as:  LEXAPRO Take 1 tablet (10 mg total) by mouth daily.   gabapentin 300 MG capsule Commonly known as:  NEURONTIN Take 3-4 capsules by mouth daily.   LORazepam 1 MG tablet Commonly known as:  ATIVAN Take 1 tablet (1 mg total) by mouth 3 (three) times daily as needed for anxiety.   predniSONE 50 MG tablet Commonly known as:   DELTASONE Take 1 tablet (50 mg total) by mouth daily with breakfast.       No results found for this or any previous visit (from the past 24 hour(s)). No results found.   ROS: Negative, with the exception of above mentioned in HPI   Objective:  BP (!) 141/96 (BP Location: Right Arm, Patient Position: Sitting, Cuff Size: Normal)   Pulse 100   Temp 98.5 F (36.9 C)   Resp 20   Wt 149 lb 4 oz (67.7 kg)   SpO2 98%   BMI 26.23 kg/m  Body mass index is 26.23 kg/m. Gen: Afebrile. No acute distress. Nontoxic in appearance, well developed, well nourished. Pleasant caucasian female. Tearful.  HENT: AT. [place]. MMM Eyes:Pupils Equal Round Reactive to light, Extraocular movements intact,  Conjunctiva without redness, discharge or icterus. Neck/lymp/endocrine: Supple,no lymphadenopathy, no thyromegaly.  CV: RRR, no edema Chest: CTAB, no wheeze or crackles. Good air movement, normal resp effort.  Abd: Soft. NTND. BS present.  Neuro: Normal gait. PERLA. EOMi. Alert. Oriented x3  Psych: tearful at times. Normal affect, dress and demeanor. Normal speech. Normal thought content and judgment.  Assessment/Plan: Kathryn Underwood is a 55 y.o. female present for acute OV for  Fatigue, unspecified type/Alcohol dependence with unspecified alcohol-induced disorder (HCC)/Depression with anxiety Encounter for alcohol abuse counseling and surveillance - fatigue possibly multifactorial with all the above conditions. Discussed in length today her alcohol dependence and she wants to try to quit on her own. She understands this can be very difficult and dangerous and has gone through it once in the past. Ativan prescribed with tapering instructions over next few weeks. Goal is to completely remove off benzo. Cautioned on sedation.  - Contact info for AA. Encouraged pt to call and set up a schedule and start attending meetings immediately.  - Monitor for any signs of emergent withdraw symptoms and report to ED  immediately if experience.  - More control of depression and anxiety is needed, start Lexapro low dose.  - R/O other common causes of fatigue with labs today, considering she state she is symptomatic with activity.  - CBC w/Diff - TSH - Comp Met (CMET) - Vitamin D (25 hydroxy) - B12 - Folate - Iron Binding Cap (TIBC) - HgB A1c - Start LORazepam (ATIVAN) 1 MG tablet; Take 1 tablet (1 mg total) by mouth 3 (three) times daily as needed for anxiety.  Dispense: 90 tablet; Refill: 0 - Continue buPROPion (WELLBUTRIN SR) 200 MG 12 hr tablet; Take 1 tablet by mouth 2 (two) times daily after a  meal. - Continue gabapentin (NEURONTIN) 300 MG capsule; Take 3-4 capsules by mouth daily. - Start escitalopram (LEXAPRO) 10 MG tablet; Take 1 tablet (10 mg total) by mouth daily.  Dispense: 30 tablet; Refill: 0 - f/u 1-2  week for alcohol abuse counseling/dependence and monitoring. F/U 4 weeks for depression/anxiety and medication start.     > 25 minutes spent with patient, >50% of time spent face to face counseling patient    electronically signed by:  Howard Pouch, DO  [place]

## 2015-12-10 LAB — CBC WITH DIFFERENTIAL/PLATELET
BASOS PCT: 0.2 % (ref 0.0–3.0)
Basophils Absolute: 0 10*3/uL (ref 0.0–0.1)
EOS PCT: 2.2 % (ref 0.0–5.0)
Eosinophils Absolute: 0.2 10*3/uL (ref 0.0–0.7)
HEMATOCRIT: 46 % (ref 36.0–46.0)
HEMOGLOBIN: 15.4 g/dL — AB (ref 12.0–15.0)
LYMPHS PCT: 24 % (ref 12.0–46.0)
Lymphs Abs: 2.1 10*3/uL (ref 0.7–4.0)
MCHC: 33.5 g/dL (ref 30.0–36.0)
MCV: 102 fl — AB (ref 78.0–100.0)
MONOS PCT: 6.9 % (ref 3.0–12.0)
Monocytes Absolute: 0.6 10*3/uL (ref 0.1–1.0)
Neutro Abs: 6 10*3/uL (ref 1.4–7.7)
Neutrophils Relative %: 66.7 % (ref 43.0–77.0)
Platelets: 525 10*3/uL — ABNORMAL HIGH (ref 150.0–400.0)
RBC: 4.51 Mil/uL (ref 3.87–5.11)
RDW: 13.9 % (ref 11.5–15.5)
WBC: 8.9 10*3/uL (ref 4.0–10.5)

## 2015-12-10 LAB — COMPREHENSIVE METABOLIC PANEL
ALBUMIN: 4.8 g/dL (ref 3.5–5.2)
ALK PHOS: 88 U/L (ref 39–117)
ALT: 112 U/L — AB (ref 0–35)
AST: 138 U/L — ABNORMAL HIGH (ref 0–37)
BILIRUBIN TOTAL: 0.4 mg/dL (ref 0.2–1.2)
BUN: 11 mg/dL (ref 6–23)
CO2: 28 mEq/L (ref 19–32)
Calcium: 10.3 mg/dL (ref 8.4–10.5)
Chloride: 98 mEq/L (ref 96–112)
Creatinine, Ser: 0.67 mg/dL (ref 0.40–1.20)
GFR: 96.53 mL/min (ref >60.00)
Glucose, Bld: 97 mg/dL (ref 70–99)
POTASSIUM: 4.7 meq/L (ref 3.5–5.1)
Sodium: 135 mEq/L (ref 135–145)
TOTAL PROTEIN: 7.4 g/dL (ref 6.0–8.3)

## 2015-12-10 LAB — VITAMIN B12: Vitamin B-12: 340 pg/mL (ref 211–911)

## 2015-12-10 LAB — HEMOGLOBIN A1C: HEMOGLOBIN A1C: 5.5 % (ref 4.6–6.5)

## 2015-12-10 LAB — IRON AND TIBC
%SAT: 42 % (ref 11–50)
IRON: 177 ug/dL — AB (ref 45–160)
TIBC: 421 ug/dL (ref 250–450)
UIBC: 244 ug/dL (ref 125–400)

## 2015-12-10 LAB — FOLATE: FOLATE: 9.1 ng/mL (ref >5.9)

## 2015-12-10 LAB — TSH: TSH: 2.63 u[IU]/mL (ref 0.35–4.50)

## 2015-12-10 LAB — VITAMIN D 25 HYDROXY (VIT D DEFICIENCY, FRACTURES): VITD: 20.73 ng/mL — AB (ref 30.00–100.00)

## 2015-12-11 ENCOUNTER — Telehealth: Payer: Self-pay | Admitting: *Deleted

## 2015-12-11 ENCOUNTER — Telehealth: Payer: Self-pay | Admitting: Family Medicine

## 2015-12-11 DIAGNOSIS — E559 Vitamin D deficiency, unspecified: Secondary | ICD-10-CM | POA: Insufficient documentation

## 2015-12-11 MED ORDER — VITAMIN D (ERGOCALCIFEROL) 1.25 MG (50000 UNIT) PO CAPS: 50000.0000 [IU] | ORAL_CAPSULE | ORAL | 0 refills | Status: DC

## 2015-12-11 NOTE — Telephone Encounter (Signed)
Patient called and left message stating she is taking a daily multivitamin and wanted to make sure we updated her chart. She also mentioned she was concerned about cause her recent heart palpitations since her TSH was normal and not being caused by thyroid issues. Called patient back got voice mail left patient a message stating if she experienced any chest pain SHOB , nausea report to the ER. If not she was instructed at her recent visit to schedule an appt in 1-2 weeks for follow up. Advised her to call to schedule that appt.

## 2015-12-11 NOTE — Telephone Encounter (Signed)
Spoke with patient reviewed information and instructions. Patient verbalized understanding. 

## 2015-12-11 NOTE — Telephone Encounter (Signed)
Please cal pt: - her labs will be reviewed in detail at her next appt.  - The changes seen are consistent with her current condition.  - she also has low vit d and low normal B12. I have called in Vit D supplement for her to take once a week for 12 weeks. I also would like her to start an OTC Vit D 800 u daily and B12 1000 mcg.

## 2016-01-14 ENCOUNTER — Other Ambulatory Visit: Payer: Self-pay | Admitting: *Deleted

## 2016-01-14 DIAGNOSIS — F418 Other specified anxiety disorders: Secondary | ICD-10-CM

## 2016-01-14 MED ORDER — ESCITALOPRAM OXALATE 10 MG PO TABS: 10.0000 mg | ORAL_TABLET | Freq: Every day | ORAL | 0 refills | Status: DC

## 2016-01-14 NOTE — Telephone Encounter (Signed)
14 day supply of escitalopram sent to pharmacy . Patient needs appt prior to anymore refills. Left message on patient voice mail for her to call and schedule appt for depression follow up.

## 2016-01-22 ENCOUNTER — Ambulatory Visit: Payer: Self-pay | Admitting: Family Medicine

## 2016-01-22 ENCOUNTER — Encounter: Payer: Self-pay | Admitting: Family Medicine

## 2016-01-28 ENCOUNTER — Ambulatory Visit: Payer: Self-pay | Admitting: Family Medicine

## 2016-01-30 ENCOUNTER — Ambulatory Visit (INDEPENDENT_AMBULATORY_CARE_PROVIDER_SITE_OTHER): Payer: Self-pay | Admitting: Family Medicine

## 2016-01-30 ENCOUNTER — Encounter: Payer: Self-pay | Admitting: Family Medicine

## 2016-01-30 VITALS — BP 140/96 | HR 101 | Temp 98.4°F | Resp 16 | Ht 63.0 in | Wt 140.8 lb

## 2016-01-30 DIAGNOSIS — Z23 Encounter for immunization: Secondary | ICD-10-CM

## 2016-01-30 DIAGNOSIS — F1029 Alcohol dependence with unspecified alcohol-induced disorder: Secondary | ICD-10-CM

## 2016-01-30 DIAGNOSIS — F418 Other specified anxiety disorders: Secondary | ICD-10-CM

## 2016-01-30 DIAGNOSIS — Z7141 Alcohol abuse counseling and surveillance of alcoholic: Secondary | ICD-10-CM

## 2016-01-30 MED ORDER — LORAZEPAM 1 MG PO TABS: 1.0000 mg | ORAL_TABLET | Freq: Three times a day (TID) | ORAL | 0 refills | Status: DC | PRN

## 2016-01-30 MED ORDER — ESCITALOPRAM OXALATE 20 MG PO TABS: 20.0000 mg | ORAL_TABLET | Freq: Every day | ORAL | 0 refills | Status: DC

## 2016-01-30 NOTE — Patient Instructions (Signed)
I have refilled the lexapro at the higher dose of 20 mg. This should help you even more.  I think going away for the "detox" is good.  Make certain to take the ativan every 8 hours as needed on the day you stop drinking to avoid detox symptoms.   I do not want you to take ativan when you are drinking.   F/U 1 month to see how you doing with stopping drinking.

## 2016-01-30 NOTE — Progress Notes (Signed)
Pre visit review using our clinic review tool, if applicable. No additional management support is needed unless otherwise documented below in the visit note. 

## 2016-01-30 NOTE — Progress Notes (Signed)
Kathryn Underwood , 12-Apr-1960, 57 y.o., female MRN: [id number] Patient Care Team    Relationship Specialty Notifications Start End  Ma Hillock, DO PCP - General Family Medicine  01/04/15   Lendon Colonel, MD Referring Physician Psychiatry  12/09/15     CC: Alcohol abuse Subjective:  Anxiety/alcohol dependence/abuse disorder: She presents today for follow-up on anxiety and alcohol dependence. She states she has cut down to about a half a bottle of wine a day. She is trying to stay busy. She states she now she needs to get away at least for a long weekend, where she will not have access to alcohol to help "detox ". She states that she and her daughter are considering going [place] the [place], that will allow her to do this. She is adamant that Alcoholics Anonymous meetings will not help her, she was provided with contact information for them on her last office visit. She reports that the Lexapro 10 mg did make a big difference in her anxiety, and she felt better. She was using the Ativan 2 times a day, especially at night for sleep. She is considering [place] weekend to "detox". She has her daughter going with her.  Prior note: Pt presents for an acute OV with complaints of "fatigue". She has not had insurance coverage for sometime and therefore not scheduled CPE. She is followed by Psychiatry yearly for depression and anxiety and prescribed Wellbutrin 200 mg daily. She reports she has been on this medication for a very long time. She was tried in September on prozac 10 mg, and did not feel it was helpful. She states the last 6 weeks she has had no motivation. She is sleeping more often to escape. She is drinking " too much" and she wants to quit. She reports her drinking is causing problems with the relationship of her son. He will not speak to her anymore and they live together. She reports she tried to quit drinking a few years ago and went through rehab for a few weeks. She  did go to [facility], but found them difficult to go to since she was having difficulty quitting and felt bad standing up to tell the group she drank again. She currently drinks about 1 "large" bottle of wine a day.  She states she does feel guilty about her drinking. She feels she needs alcohol to cope. She does not need an "eye opener daily". She states she does have symptoms of feeling flushed, rapid heartbeat, shortness of breath, palpitaitons and extreme fatigue after trying to be active. She endorses not eating much meat products. Hot, rapid heartbeat.   Negative Mood disorder screen.   Depression screen [facility] 2/9 12/09/2015 01/04/2015  Decreased Interest 3 1  Down, Depressed, Hopeless 3 1  PHQ - 2 Score 6 2  Altered sleeping 3 1  Tired, decreased energy 3 2  Change in appetite 3 1  Feeling bad or failure about yourself  3 2  Trouble concentrating 1 0  Moving slowly or fidgety/restless 0 0  Suicidal thoughts 1 0  PHQ-9 Score 20 8  Difficult doing work/chores - Not difficult at all    GAD 7 : Generalized Anxiety Score 12/09/2015  Nervous, Anxious, on Edge 3  Control/stop worrying 3  Worry too much - different things 3  Trouble relaxing 3  Restless 0  Easily annoyed or irritable 0  Afraid - awful might happen 2  Total GAD 7 Score  14  Anxiety Difficulty Somewhat difficult      No Known Allergies Social History  Substance Use Topics  . Smoking status: Current Every Day Smoker    Packs/day: 0.50    Types: Cigarettes  . Smokeless tobacco: Never Used  . Alcohol use 4.8 oz/week    8 Glasses of wine per week     Comment: daily   Past Medical History:  Diagnosis Date  . Anxiety   . Chickenpox   . Depression   . Neuromuscular disorder ([place])    neuropathy   Past Surgical History:  Procedure Laterality Date  . CESAREAN SECTION    . TONSILLECTOMY     Family History  Problem Relation Age of Onset  . Dementia Mother   . Arthritis Father   . Asthma Father   . Asthma  Brother   . Diabetes Maternal Grandmother   . Dementia Maternal Grandmother   . Diabetes Paternal Grandmother   . Parkinson's disease Maternal Grandfather   . Parkinson's disease Paternal Grandfather   . Breast cancer Neg Hx   . Colon cancer Neg Hx    Allergies as of 01/30/2016   No Known Allergies     Medication List       Accurate as of 01/30/16 11:37 AM. Always use your most recent med list.          buPROPion 200 MG 12 hr tablet Commonly known as:  WELLBUTRIN SR Take 1 tablet by mouth 2 (two) times daily after a meal.   cholecalciferol 400 units Tabs tablet Commonly known as:  VITAMIN D Take 800 Units by mouth daily.   cyanocobalamin 1000 MCG tablet Take 1,000 mcg by mouth daily.   escitalopram 10 MG tablet Commonly known as:  LEXAPRO Take 1 tablet (10 mg total) by mouth daily. Needs office visit prior to anymore refills.   gabapentin 300 MG capsule Commonly known as:  NEURONTIN Take 3-4 capsules by mouth daily.   LORazepam 1 MG tablet Commonly known as:  ATIVAN Take 1 tablet (1 mg total) by mouth 3 (three) times daily as needed for anxiety.   multivitamin capsule Take 1 capsule by mouth daily.   Vitamin D (Ergocalciferol) 50000 units Caps capsule Commonly known as:  DRISDOL Take 1 capsule (50,000 Units total) by mouth every 7 (seven) days.       No results found for this or any previous visit (from the past 24 hour(s)). No results found.   ROS: Negative, with the exception of above mentioned in HPI   Objective:  BP (!) 140/96 (BP Location: Left Arm, Patient Position: Sitting, Cuff Size: Normal)   Pulse (!) 101   Temp 98.4 F (36.9 C) (Oral)   Resp 16   Ht 5\' 3"  (1.6 m)   Wt 140 lb 12 oz (63.8 kg)   SpO2 93%   BMI 24.93 kg/m  Body mass index is 24.93 kg/m.  Gen: Afebrile. No acute distress. Nontoxic in appearance, well-developed, well-nourished, pleasant Caucasian female. HENT: AT. [place].  MMM. Eyes:Pupils Equal Round Reactive to light,  Extraocular movements intact,  Conjunctiva without redness, discharge or icterus. Neuro: Normal gait. PERLA. EOMi. Alert. Oriented.  Psych: Mildly anxious. Otherwise Normal affect, dress and demeanor. Normal speech. Normal thought content and judgment..   Assessment/Plan: Kathryn Underwood is a 57 y.o. female present for acute OV for  Alcohol dependence with unspecified alcohol-induced disorder (HCC)/Depression with anxiety Encounter for alcohol abuse counseling and surveillance - Patient making some improvement, seems to be  motivated to stop drinking.  - Anxiety/depression seems to be improving with the addition of Lexapro 10 mg, will increase to Lexapro 20 mg daily today. - Ativan 1 mg 3 times a day when necessary was refilled today. Lengthy discussion surrounding use of this medication. Ideally this will not be a long-term medicine for her. Discussed she should not be using this with alcohol use. She is to use this medication only as needed as Lexapro levels increase and control her anxiety. She is to use this when she does quit drinking during to decrease withdrawal symptoms. She is not to use this medication with alcohol. She voiced understanding. - Follow-up in 3 months for depression and anxiety - Follow-up in one month for alcoholism, sooner if needed.    Flu shot: Patient desires flu shot today, please shot administered.  > 25 minutes spent with patient, >50% of time spent face to face counseling patient  electronically signed by:  Howard Pouch, DO  [place]

## 2016-02-26 ENCOUNTER — Ambulatory Visit: Payer: Self-pay | Admitting: Family Medicine

## 2016-03-04 ENCOUNTER — Ambulatory Visit: Payer: Self-pay | Admitting: Family Medicine

## 2016-03-26 ENCOUNTER — Ambulatory Visit (INDEPENDENT_AMBULATORY_CARE_PROVIDER_SITE_OTHER): Payer: Self-pay

## 2016-03-26 ENCOUNTER — Ambulatory Visit (INDEPENDENT_AMBULATORY_CARE_PROVIDER_SITE_OTHER): Payer: Self-pay | Admitting: Family Medicine

## 2016-03-26 ENCOUNTER — Telehealth: Payer: Self-pay | Admitting: Family Medicine

## 2016-03-26 ENCOUNTER — Encounter: Payer: Self-pay | Admitting: Family Medicine

## 2016-03-26 VITALS — BP 130/88 | Temp 98.2°F | Ht 63.0 in | Wt 146.0 lb

## 2016-03-26 DIAGNOSIS — M25511 Pain in right shoulder: Secondary | ICD-10-CM

## 2016-03-26 DIAGNOSIS — M79671 Pain in right foot: Secondary | ICD-10-CM

## 2016-03-26 DIAGNOSIS — R0781 Pleurodynia: Secondary | ICD-10-CM

## 2016-03-26 DIAGNOSIS — S92354A Nondisplaced fracture of fifth metatarsal bone, right foot, initial encounter for closed fracture: Secondary | ICD-10-CM

## 2016-03-26 MED ORDER — HYDROCODONE-ACETAMINOPHEN 5-325 MG PO TABS: 1.0000 | ORAL_TABLET | Freq: Four times a day (QID) | ORAL | 0 refills | Status: DC | PRN

## 2016-03-26 NOTE — Progress Notes (Signed)
Pre visit review using our clinic review tool, if applicable. No additional management support is needed unless otherwise documented below in the visit note. 

## 2016-03-26 NOTE — Progress Notes (Signed)
Kathryn Underwood is a 56 y.o. female here for a new problem.  History of Present Illness:   Mechanical fall yesterday. Was walking her dog, the dog pulled quickly and she lost her balance, falling onto her right side with her right arm in full abduction. Felt pain in right shoulder, right rib line just below breast, and right lateral foot. No head trauma or LOC. Was able to get up on her own. Right shoulder with full ROM but with pain. No Hx of issues. Had a hard time sleeping last night due to pain under her right breast - at rib line. No wheeze, cough, SOB. Nonsmoker. Right foot, lateral bruising.   PMHx, SurgHx, SocialHx, Medications, and Allergies were reviewed in the Visit Navigator and updated as appropriate.  Current Medications:   Current Outpatient Prescriptions:  .  buPROPion (WELLBUTRIN SR) 200 MG 12 hr tablet, Take 1 tablet by mouth 2 (two) times daily after a meal., Disp: , Rfl:  .  cholecalciferol (VITAMIN D) 400 units TABS tablet, Take 800 Units by mouth daily., Disp: , Rfl:  .  cyanocobalamin 1000 MCG tablet, Take 1,000 mcg by mouth daily. , Disp: , Rfl:  .  escitalopram (LEXAPRO) 20 MG tablet, Take 1 tablet (20 mg total) by mouth daily., Disp: 90 tablet, Rfl: 0 .  gabapentin (NEURONTIN) 300 MG capsule, Take 3-4 capsules by mouth daily., Disp: , Rfl:  .  LORazepam (ATIVAN) 1 MG tablet, Take 1 tablet (1 mg total) by mouth 3 (three) times daily as needed for anxiety., Disp: 90 tablet, Rfl: 0 .  Multiple Vitamin (MULTIVITAMIN) capsule, Take 1 capsule by mouth daily., Disp: , Rfl:    Review of Systems:   Constitutional: Negative for fever, chills and unexpected weight change.  HEENT: Negative for ear discharge, ear pain, mouth sores, sore throat, tinnitus and trouble swallowing. LUNGS: Negative for cough, choking, chest tightness, shortness of breath and wheezing.   CV: Negative for chest pain, palpitations and leg swelling.  GI: Negative for nausea, abdominal pain, diarrhea,  constipation and abdominal distention. No new change in bowel habits. GU: Negative for dysuria, flank pain and difficulty urinating.  NEURO: Negative for dizziness, tremors, speech difficulty, weakness and headaches. No visual changes.    Vitals:   Vitals:   03/26/16 1336  BP: 130/88  Temp: 98.2 F (36.8 C)  TempSrc: Oral  Weight: 146 lb (66.2 kg)  Height: 5\' 3"  (1.6 m)     Body mass index is 25.86 kg/m.  Physical Exam:   General: Alert, cooperative, appears stated age and no distress.  HEENT:  Normocephalic, without obvious abnormality, atraumatic. Conjunctivae/corneas clear. PERRL, EOM's intact. Normal TM's and external ear canals both ears. Nares normal. Septum midline. Mucosa normal. No drainage or sinus tenderness. Lips, mucosa, and tongue normal; teeth and gums normal.  Lungs: Clear to auscultation bilaterally.  Heart:: Regular rate and rhythm, S1, S2 normal, no murmur, click, rub or gallop.  Abdomen: Soft, non-tender; bowel sounds normal; no masses,  no organomegaly.  Extremities: Extremities normal, atraumatic, no cyanosis or edema.  Pulses: 2+ and symmetric.  Skin: Bruising along right lateral foot.   Neurologic: Alert and oriented X 3, normal strength and tone. Normal symmetric. reflexes. Normal coordination and gait.  Psych: Alert,oriented, in NAD with a full range of affect, normal behavior and no psychotic features  Msk: Right shoulder, pain with abduction but has full ROM. Right ribs with ttp just under right breast but without bruising or step-off palpated. Right lateral  foot with edema, bruise, and ttp without obvious deformity otherwise.    Assessment and Plan:    Leighan was seen today for fall.  Diagnoses and all orders for this visit:  Right foot pain -     DG Foot Complete Right - fifth metatarsal fracture (see below)  Rib pain on right side -     DG Ribs Unilateral W/Chest Right - no acute findings  Acute pain of right shoulder -     DG Shoulder  Right - no acute findings  Closed nondisplaced fracture of fifth metatarsal bone of right foot, initial encounter Comments: Patient self-pay, so declined a walking boot due to cost. Okay for post-op shoe today, with instructions to remain non-weight bearing as much as possible.  Orders: -     HYDROcodone-acetaminophen (NORCO/VICODIN) 5-325 MG tablet; Take 1 tablet by mouth every 6 (six) hours as needed for moderate pain. Patient counseled at length to monitor ETOH and benzodiazepine use while taking opoid for pain control. Risks discussed at length. Patient endorsed understanding. Follow up with Orthopedics in 1-2 weeks.    . Reviewed expectations re: course of current medical issues. . Discussed self-management of symptoms. . Outlined signs and symptoms indicating need for more acute intervention. . Patient verbalized understanding and all questions were answered. . See orders for this visit as documented in the electronic medical record. . Patient received an After-Visit Summary.   Briscoe Deutscher, D.O.

## 2016-03-30 ENCOUNTER — Telehealth: Payer: Self-pay | Admitting: Family Medicine

## 2016-03-30 NOTE — Telephone Encounter (Signed)
Error

## 2016-04-07 ENCOUNTER — Ambulatory Visit: Payer: Self-pay | Admitting: Sports Medicine

## 2016-04-08 ENCOUNTER — Ambulatory Visit (INDEPENDENT_AMBULATORY_CARE_PROVIDER_SITE_OTHER): Payer: Self-pay | Admitting: Sports Medicine

## 2016-04-08 ENCOUNTER — Encounter: Payer: Self-pay | Admitting: Sports Medicine

## 2016-04-08 ENCOUNTER — Ambulatory Visit (INDEPENDENT_AMBULATORY_CARE_PROVIDER_SITE_OTHER): Payer: Self-pay

## 2016-04-08 VITALS — BP 122/88 | HR 76 | Ht 63.0 in | Wt 145.4 lb

## 2016-04-08 DIAGNOSIS — S92354A Nondisplaced fracture of fifth metatarsal bone, right foot, initial encounter for closed fracture: Secondary | ICD-10-CM

## 2016-04-08 DIAGNOSIS — E559 Vitamin D deficiency, unspecified: Secondary | ICD-10-CM

## 2016-04-08 DIAGNOSIS — M25511 Pain in right shoulder: Secondary | ICD-10-CM | POA: Insufficient documentation

## 2016-04-08 MED ORDER — FAMOTIDINE 20 MG PO TABS: 20.0000 mg | ORAL_TABLET | Freq: Two times a day (BID) | ORAL | 1 refills | Status: DC

## 2016-04-08 MED ORDER — IBUPROFEN 800 MG PO TABS: 800.0000 mg | ORAL_TABLET | Freq: Two times a day (BID) | ORAL | 1 refills | Status: DC | PRN

## 2016-04-08 NOTE — Patient Instructions (Signed)
Please perform the exercise program that Jeneen Rinks has prepared for you and gone over in detail on a daily basis.  In addition to the handout you were provided you can access your program through: [email or web address]   Your unique program code is: [email or web address]

## 2016-04-08 NOTE — Progress Notes (Signed)
Kathryn Underwood - 56 y.o. female MRN [id number]  Date of birth: 1960-10-11  Office Visit Note: Visit Date: 04/08/2016 PCP: Howard Pouch, DO Referred by: Ma Hillock, DO  Subjective: Chief Complaint  Patient presents with  . A Fall    Recent injury to RT shoulder, Rt rib area under breast, and LT foot. Pain is improving in shoulder and rib area with using ice. Swelling and brusing has improved in foot. Taking Advil for pain with some relief.   HPI: Patient reports overall good improvement since her last office visit has only minimal pain in the shoulder and rib.  She denies any significant loss of range of motion of the upper extremities although does have pain while laying on the right side.  No significant weakness reported in bilateral upper extremities.  She reports that she has obtained a fracture boot for the left foot but is wearing is only intermittently.  She has otherwise been using a postoperative shoe as she declined fracture boot at last visit due to lack of insurance.  She overall reports ambulating with the fracture boot as well as barefoot.  She does report there is soreness along the lateral aspect of the foot after she does so.   ROS: Denies fevers, chills, recent weight gain or weight loss.  No night sweats. No significant nighttime awakenings due to this issue.  Otherwise per HPI.  Objective:  VS:  HT:5\' 3"  (160 cm)   WT:145 lb 6.4 oz (66 kg)  BMI:25.8    BP:122/88  HR:76bpm  TEMP: ( )  RESP:94 % Physical Exam: GENERAL:  WDWN, NAD, Non-toxic appearing PSYCH:  Alert & appropriately interactive  Not depressed or anxious appearing UPPER & LOWER EXTREMITIES:  No significant rashes/lesions/ulcerations overlying the arms or legs.  No significant generalized UE edema.  No significant pretibial edema.  No clubbing or cyanosis.  Radial pulses 2+/4.  DP & PT pulses 2+/4  Sensation intact to light touch in upper and lower extremities. Right foot: In a  postoperative shoe.  She has good range of motion of the ankle.  No significant bruising or ecchymosis.  Generalized tenderness.  Right shoulder: Full overhead range of motion.  Internal rotation external rotation strength is 5 out of 5.  She has minimal pain with empty can testing although this is significantly more than on the left.  Negative Hawkins and Neer's.   Imaging & Procedures: Dg Ribs Unilateral W/chest Right  Result Date: 03/26/2016 CLINICAL DATA:  Pain following fall EXAM: RIGHT RIBS AND CHEST - 3+ VIEW COMPARISON:  None. FINDINGS: Frontal chest as well as oblique and cone-down lower rib images were obtained. Lungs are clear. Heart size and pulmonary vascularity are normal. No adenopathy. There is no apparent pneumothorax or pleural effusion. There is no apparent rib fracture. IMPRESSION: No edema or consolidation.  No evident rib fracture. Electronically Signed   By: Lowella Grip III M.D.   On: 03/26/2016 14:46   Dg Shoulder Right  Result Date: 03/26/2016 CLINICAL DATA:  Pain following fall EXAM: RIGHT SHOULDER - 2+ VIEW COMPARISON:  None. FINDINGS: Frontal, Y scapular, and axillary images were obtained. There is no fracture or dislocation. The joint spaces appear normal. No erosive change. IMPRESSION: No fracture or dislocation.  No evident arthropathy. Electronically Signed   By: Lowella Grip III M.D.   On: 03/26/2016 14:47   Dg Foot Complete Right  Result Date: 04/08/2016 CLINICAL DATA:  Status post fall 2 weeks ago, recheck right fifth  metatarsal fracture. EXAM: RIGHT FOOT COMPLETE - 3+ VIEW COMPARISON:  Right foot series of March 26, 2016 FINDINGS: Again demonstrated is the transversely oriented comminuted fracture of the base of the fifth metatarsal. Alignment remains near anatomic. No periosteal reaction is observed. The fracture lines are well demonstrated. No other metatarsal fractures are observed. The phalanges and tarsal bones are intact. The joint spaces are  reasonably well-maintained. There remains a small amount of soft tissue swelling over the base of the fifth metatarsal. IMPRESSION: No evidence of bony bridging of the fracture of the base of the fifth metatarsal. Alignment remains near anatomic. Electronically Signed   By: David  Martinique M.D.   On: 04/08/2016 10:35   Dg Foot Complete Right  Result Date: 03/26/2016 CLINICAL DATA:  Pain following fall EXAM: RIGHT FOOT COMPLETE - 3+ VIEW COMPARISON:  None. FINDINGS: Frontal, oblique, and lateral views were obtained. There is a fracture of the proximal fifth metatarsal with alignment essentially anatomic. No other evident fracture. No dislocation. Joint spaces appear normal. No erosive change. There is slight spurring along the dorsal proximal navicular. IMPRESSION: Fracture proximal fifth metatarsal with alignment essentially anatomic. No other fracture. No dislocation. No appreciable joint space narrowing. These results will be called to the ordering clinician or representative by the Radiologist Assistant, and communication documented in the PACS or zVision Dashboard. Electronically Signed   By: Lowella Grip III M.D.   On: 03/26/2016 14:45   X-ray of the foot today does show slight widening which is consistent with a Jones fracture of the zone 2 of the base of the fifth metatarsal.  Assessment & Plan: Problem List Items Addressed This Visit    Vitamin D deficiency   Closed nondisplaced fracture of fifth right metatarsal bone - Primary   Relevant Orders   DG Foot Complete Right (Completed)   Acute pain of right shoulder      Follow-up: Return in about 2 weeks (around 04/22/2016) for repeat X-rays.   Past Medical/Family/Surgical/Social History: Medications & Allergies reviewed per EMR Patient Active Problem List   Diagnosis Date Noted  . Closed nondisplaced fracture of fifth right metatarsal bone 04/08/2016  . Acute pain of right shoulder 04/08/2016  . Vitamin D deficiency 12/11/2015  .  Alcohol dependence with unspecified alcohol-induced disorder ([place]) 12/09/2015  . Fatigue 12/09/2015  . Encounter for alcohol abuse counseling and surveillance 12/09/2015  . Tobacco abuse 01/04/2015  . Depression with anxiety 01/04/2015   Past Medical History:  Diagnosis Date  . Anxiety   . Chickenpox   . Depression   . Neuromuscular disorder (HCC)    neuropathy   Family History  Problem Relation Age of Onset  . Dementia Mother   . Arthritis Father   . Asthma Father   . Asthma Brother   . Diabetes Maternal Grandmother   . Dementia Maternal Grandmother   . Diabetes Paternal Grandmother   . Parkinson's disease Maternal Grandfather   . Parkinson's disease Paternal Grandfather   . Breast cancer Neg Hx   . Colon cancer Neg Hx    Past Surgical History:  Procedure Laterality Date  . CESAREAN SECTION    . TONSILLECTOMY     Social History   Occupational History  . Not on file.   Social History Main Topics  . Smoking status: Current Every Day Smoker    Packs/day: 0.50    Types: Cigarettes  . Smokeless tobacco: Never Used  . Alcohol use 4.8 oz/week    8 Glasses of wine per  week     Comment: daily  . Drug use: No  . Sexual activity: Yes    Partners: Male     Comment: female partner

## 2016-04-12 NOTE — Assessment & Plan Note (Signed)
Patient does have a Jones fracture.  Extensive time was spent in discussing the expected course of this condition.  Operative versus nonoperative options were discussed and she would like to proceed with nonoperative management.  She does have a fracture boot at home and I have recommended 24 hour usage of this with 6 weeks total of immobilization.  Patient voices understanding there is a high risk of nonunion/malunion.  Three-view x-ray of the right foot follow-up in 2 weeks.

## 2016-04-12 NOTE — Assessment & Plan Note (Signed)
Overall status and consistent with contusion with possible underlying small rotator cuff involvement of the supraspinatus although she has had good improvement since her last visit.  We will continue to follow this.

## 2016-04-12 NOTE — Assessment & Plan Note (Signed)
She is vitamin D deficient but currently on therapeutic levels.  She has not had a bone mineral density test performed.  This will need to be followed.

## 2016-04-22 ENCOUNTER — Ambulatory Visit (INDEPENDENT_AMBULATORY_CARE_PROVIDER_SITE_OTHER): Payer: Self-pay | Admitting: Sports Medicine

## 2016-04-22 ENCOUNTER — Other Ambulatory Visit: Payer: Self-pay | Admitting: *Deleted

## 2016-04-22 ENCOUNTER — Telehealth: Payer: Self-pay | Admitting: Sports Medicine

## 2016-04-22 ENCOUNTER — Ambulatory Visit (INDEPENDENT_AMBULATORY_CARE_PROVIDER_SITE_OTHER): Payer: Self-pay

## 2016-04-22 VITALS — BP 130/90 | HR 108 | Ht 63.0 in | Wt 148.0 lb

## 2016-04-22 DIAGNOSIS — S92354D Nondisplaced fracture of fifth metatarsal bone, right foot, subsequent encounter for fracture with routine healing: Secondary | ICD-10-CM

## 2016-04-22 DIAGNOSIS — Z720 Tobacco use: Secondary | ICD-10-CM

## 2016-04-22 DIAGNOSIS — E559 Vitamin D deficiency, unspecified: Secondary | ICD-10-CM

## 2016-04-22 NOTE — Telephone Encounter (Signed)
Pharmacy called asking for lorazepam for patient currently patient is not supposed to be taking this medication anymore and would need an office visit for evaluation by the Dr.

## 2016-04-22 NOTE — Assessment & Plan Note (Signed)
She has cut back but has not quit completely.  Emphasized the importance of this.

## 2016-04-22 NOTE — Progress Notes (Signed)
AZALYA GALYON - 56 y.o. female MRN [id number]  Date of birth: 1960/02/26  Office Visit Note: Visit Date: 04/22/2016 PCP: Howard Pouch, DO Referred by: Ma Hillock, DO  Subjective: Chief Complaint  Patient presents with  . 2 Week Follow Up    Fracture of the RT 5th metatarsal. Pt has been wearing a boot contanstanly throughout the day and at night. There is slight pain when weight bearing. Swelling and redness has improved.    HPI: Patient is having overall decent improvement in her symptoms.  She is only minimally painful to touch.  She has been compliant with the fracture boot is worn the majority of the time.  She is 4 weeks out from injury. ROS:  Otherwise per HPI.  Objective:  VS:  HT:5\' 3"  (160 cm)   WT:148 lb (67.1 kg)  BMI:26.3    BP:130/90  HR:(!) 108bpm  TEMP: ( )  RESP:96 % Physical Exam: General:   WDWN, NAD, Non-toxic appearing Psych:  Alert & appropriately interactive  Not depressed or anxious appearing Foot & Ankle:  No significant rashes/lesions/ulcerations overlying the legs.  No significant bruising or scarring  No significant pretibial edema.  No clubbing or cyanosis.  DP & PT pulses 2+/4.  LE Sensation intact to light touch.  Overall foot and ankle are well aligned, no significant deformity.   Very mild TTP over the base of the fifth metatarsal.  No pain over the navicular, medial or lateral malleoli.  No significant pain or discomfort with isolated passive forefoot abduction.  Inversion, eversion, dorsiflexion and plantar flexion strength 5+/5.  Imaging & Procedures: Three-view x-ray of the right foot reviewed by myself today.  This does show a stable Jones fracture.  No significant widening.  There is a slightly obliqueness of the images today compared to the last time  Assessment & Plan: Problem List Items Addressed This Visit    Closed nondisplaced fracture of fifth right metatarsal bone - Primary    This overall appears stable.   Emphasized importance of continuing to wear the boot and minimize weightbearing.  This does have a high risk of nonunion.  She does seem to be progressing quite well however.  Only minimal pain today.  We will plan for repeat x-rays of her foot and 2 weeks of follow-up and hopefully be able to advance her activities and discontinue boot at night.      Relevant Orders   DG Foot Complete Right   Tobacco abuse    She has cut back but has not quit completely.  Emphasized the importance of this.      Vitamin D deficiency    Patient should increase her vitamin D intake to 2000 units daily.  She is previously supplemented with 50,000 units but this has not been rechecked.  Would recommend recheck it next blood draw.         Follow-up: Return in about 2 weeks (around 05/06/2016) for repeat X-rays.   Past Medical/Family/Surgical/Social History: Medications & Allergies reviewed per EMR Patient Active Problem List   Diagnosis Date Noted  . Closed nondisplaced fracture of fifth right metatarsal bone 04/08/2016  . Acute pain of right shoulder 04/08/2016  . Vitamin D deficiency 12/11/2015  . Alcohol dependence with unspecified alcohol-induced disorder ([place]) 12/09/2015  . Fatigue 12/09/2015  . Encounter for alcohol abuse counseling and surveillance 12/09/2015  . Tobacco abuse 01/04/2015  . Depression with anxiety 01/04/2015   Past Medical History:  Diagnosis Date  . Anxiety   .  Chickenpox   . Depression   . Neuromuscular disorder ([facility])    neuropathy   Family History  Problem Relation Age of Onset  . Dementia Mother   . Arthritis Father   . Asthma Father   . Asthma Brother   . Diabetes Maternal Grandmother   . Dementia Maternal Grandmother   . Diabetes Paternal Grandmother   . Parkinson's disease Maternal Grandfather   . Parkinson's disease Paternal Grandfather   . Breast cancer Neg Hx   . Colon cancer Neg Hx    Past Surgical History:  Procedure Laterality Date  . CESAREAN  SECTION    . TONSILLECTOMY     Social History   Occupational History  . Not on file.   Social History Main Topics  . Smoking status: Current Every Day Smoker    Packs/day: 0.50    Types: Cigarettes  . Smokeless tobacco: Never Used  . Alcohol use 4.8 oz/week    8 Glasses of wine per week     Comment: daily  . Drug use: No  . Sexual activity: Yes    Partners: Male     Comment: female partner

## 2016-04-22 NOTE — Patient Instructions (Signed)
Increase your Vitamin D3 to 2000IU per day Keep wearing your boot until I see you back!

## 2016-04-22 NOTE — Telephone Encounter (Signed)
**  Remind patient they can make refill requests via MyChart**  Medication refill request (Name & Dosage): LORazepam (ATIVAN) 1 MG tablet [[id number]]    Preferred pharmacy (Name & Address): [id number] - [place], [place] [phone] (Phone) [phone] (Fax)       Other comments (if applicable):   Pharmacy called asking for refill for patient.

## 2016-04-22 NOTE — Assessment & Plan Note (Signed)
Patient should increase her vitamin D intake to 2000 units daily.  She is previously supplemented with 50,000 units but this has not been rechecked.  Would recommend recheck it next blood draw.

## 2016-04-22 NOTE — Assessment & Plan Note (Signed)
This overall appears stable.  Emphasized importance of continuing to wear the boot and minimize weightbearing.  This does have a high risk of nonunion.  She does seem to be progressing quite well however.  Only minimal pain today.  We will plan for repeat x-rays of her foot and 2 weeks of follow-up and hopefully be able to advance her activities and discontinue boot at night.

## 2016-04-25 ENCOUNTER — Inpatient Hospital Stay (HOSPITAL_BASED_OUTPATIENT_CLINIC_OR_DEPARTMENT_OTHER)
Admission: EM | Admit: 2016-04-25 | Discharge: 2016-04-28 | DRG: 897 | Disposition: A | Discharge status: Home / Self Care | Payer: Self-pay | Attending: Internal Medicine | Admitting: Internal Medicine

## 2016-04-25 ENCOUNTER — Encounter (HOSPITAL_BASED_OUTPATIENT_CLINIC_OR_DEPARTMENT_OTHER): Payer: Self-pay | Admitting: Emergency Medicine

## 2016-04-25 DIAGNOSIS — R52 Pain, unspecified: Secondary | ICD-10-CM

## 2016-04-25 DIAGNOSIS — R740 Nonspecific elevation of levels of transaminase and lactic acid dehydrogenase [LDH]: Secondary | ICD-10-CM | POA: Diagnosis present

## 2016-04-25 DIAGNOSIS — F10230 Alcohol dependence with withdrawal, uncomplicated: Secondary | ICD-10-CM

## 2016-04-25 DIAGNOSIS — F10239 Alcohol dependence with withdrawal, unspecified: Principal | ICD-10-CM | POA: Diagnosis present

## 2016-04-25 DIAGNOSIS — R000 Tachycardia, unspecified: Secondary | ICD-10-CM

## 2016-04-25 DIAGNOSIS — F410 Panic disorder [episodic paroxysmal anxiety] without agoraphobia: Secondary | ICD-10-CM | POA: Diagnosis present

## 2016-04-25 DIAGNOSIS — F10939 Alcohol use, unspecified with withdrawal, unspecified: Secondary | ICD-10-CM | POA: Diagnosis present

## 2016-04-25 DIAGNOSIS — F10930 Alcohol use, unspecified with withdrawal, uncomplicated: Secondary | ICD-10-CM

## 2016-04-25 DIAGNOSIS — F1410 Cocaine abuse, uncomplicated: Secondary | ICD-10-CM | POA: Diagnosis present

## 2016-04-25 DIAGNOSIS — F332 Major depressive disorder, recurrent severe without psychotic features: Secondary | ICD-10-CM | POA: Diagnosis present

## 2016-04-25 DIAGNOSIS — Z79899 Other long term (current) drug therapy: Secondary | ICD-10-CM

## 2016-04-25 DIAGNOSIS — D72829 Elevated white blood cell count, unspecified: Secondary | ICD-10-CM

## 2016-04-25 DIAGNOSIS — Z825 Family history of asthma and other chronic lower respiratory diseases: Secondary | ICD-10-CM

## 2016-04-25 DIAGNOSIS — F10232 Alcohol dependence with withdrawal with perceptual disturbance: Secondary | ICD-10-CM

## 2016-04-25 DIAGNOSIS — F1210 Cannabis abuse, uncomplicated: Secondary | ICD-10-CM | POA: Diagnosis present

## 2016-04-25 DIAGNOSIS — F1029 Alcohol dependence with unspecified alcohol-induced disorder: Secondary | ICD-10-CM | POA: Diagnosis present

## 2016-04-25 DIAGNOSIS — Z833 Family history of diabetes mellitus: Secondary | ICD-10-CM

## 2016-04-25 DIAGNOSIS — R7401 Elevation of levels of liver transaminase levels: Secondary | ICD-10-CM

## 2016-04-25 DIAGNOSIS — F411 Generalized anxiety disorder: Secondary | ICD-10-CM | POA: Diagnosis present

## 2016-04-25 DIAGNOSIS — R739 Hyperglycemia, unspecified: Secondary | ICD-10-CM | POA: Diagnosis present

## 2016-04-25 DIAGNOSIS — F418 Other specified anxiety disorders: Secondary | ICD-10-CM | POA: Diagnosis present

## 2016-04-25 DIAGNOSIS — F17210 Nicotine dependence, cigarettes, uncomplicated: Secondary | ICD-10-CM | POA: Diagnosis present

## 2016-04-25 LAB — TROPONIN I: Troponin I: <0.03 ng/mL (ref <0.03)

## 2016-04-25 LAB — COMPREHENSIVE METABOLIC PANEL
ALBUMIN: 4.4 g/dL (ref 3.5–5.0)
ALK PHOS: 80 U/L (ref 38–126)
ALT: 74 U/L — AB (ref 14–54)
AST: 95 U/L — ABNORMAL HIGH (ref 15–41)
Anion gap: 13 (ref 5–15)
BUN: 19 mg/dL (ref 6–20)
CALCIUM: 9.5 mg/dL (ref 8.9–10.3)
CO2: 22 mmol/L (ref 22–32)
CREATININE: 0.79 mg/dL (ref 0.44–1.00)
Chloride: 102 mmol/L (ref 101–111)
GFR calc Af Amer: >60 mL/min (ref >60)
GFR calc non Af Amer: >60 mL/min (ref >60)
GLUCOSE: 99 mg/dL (ref 65–99)
Potassium: 4.5 mmol/L (ref 3.5–5.1)
SODIUM: 137 mmol/L (ref 135–145)
Total Bilirubin: 0.6 mg/dL (ref 0.3–1.2)
Total Protein: 7.6 g/dL (ref 6.5–8.1)

## 2016-04-25 LAB — CBC WITH DIFFERENTIAL/PLATELET
Basophils Absolute: 0 10*3/uL (ref 0.0–0.1)
Basophils Relative: 0 %
EOS ABS: 0.1 10*3/uL (ref 0.0–0.7)
Eosinophils Relative: 1 %
HCT: 43.2 % (ref 36.0–46.0)
HEMOGLOBIN: 14.6 g/dL (ref 12.0–15.0)
LYMPHS ABS: 2.2 10*3/uL (ref 0.7–4.0)
Lymphocytes Relative: 19 %
MCH: 34.6 pg — AB (ref 26.0–34.0)
MCHC: 33.8 g/dL (ref 30.0–36.0)
MCV: 102.4 fL — ABNORMAL HIGH (ref 78.0–100.0)
Monocytes Absolute: 0.9 10*3/uL (ref 0.1–1.0)
Monocytes Relative: 8 %
NEUTROS ABS: 8.5 10*3/uL — AB (ref 1.7–7.7)
NEUTROS PCT: 72 %
Platelets: 453 10*3/uL — ABNORMAL HIGH (ref 150–400)
RBC: 4.22 MIL/uL (ref 3.87–5.11)
RDW: 12.7 % (ref 11.5–15.5)
WBC: 11.7 10*3/uL — AB (ref 4.0–10.5)

## 2016-04-25 LAB — CBC
HCT: 36.8 % (ref 36.0–46.0)
HEMOGLOBIN: 12.1 g/dL (ref 12.0–15.0)
MCH: 34.1 pg — AB (ref 26.0–34.0)
MCHC: 32.9 g/dL (ref 30.0–36.0)
MCV: 103.7 fL — AB (ref 78.0–100.0)
Platelets: 420 10*3/uL — ABNORMAL HIGH (ref 150–400)
RBC: 3.55 MIL/uL — AB (ref 3.87–5.11)
RDW: 13 % (ref 11.5–15.5)
WBC: 9.6 10*3/uL (ref 4.0–10.5)

## 2016-04-25 LAB — RAPID URINE DRUG SCREEN, HOSP PERFORMED
Amphetamines: NOT DETECTED
BARBITURATES: NOT DETECTED
Benzodiazepines: NOT DETECTED
Cocaine: POSITIVE — AB
Opiates: NOT DETECTED
TETRAHYDROCANNABINOL: POSITIVE — AB

## 2016-04-25 LAB — MAGNESIUM
MAGNESIUM: 2 mg/dL (ref 1.7–2.4)
Magnesium: 2.1 mg/dL (ref 1.7–2.4)

## 2016-04-25 LAB — PHOSPHORUS: PHOSPHORUS: 3.5 mg/dL (ref 2.5–4.6)

## 2016-04-25 LAB — CREATININE, SERUM
CREATININE: 0.7 mg/dL (ref 0.44–1.00)
GFR calc non Af Amer: >60 mL/min (ref >60)

## 2016-04-25 LAB — BRAIN NATRIURETIC PEPTIDE: B Natriuretic Peptide: 27.5 pg/mL (ref 0.0–100.0)

## 2016-04-25 LAB — LACTIC ACID, PLASMA: LACTIC ACID, VENOUS: 1.1 mmol/L (ref 0.5–1.9)

## 2016-04-25 LAB — TSH: TSH: 2.626 u[IU]/mL (ref 0.350–4.500)

## 2016-04-25 LAB — ETHANOL: Alcohol, Ethyl (B): 28 mg/dL — ABNORMAL HIGH (ref <5)

## 2016-04-25 MED ORDER — GABAPENTIN 300 MG PO CAPS
900.0000 mg | ORAL_CAPSULE | Freq: Every day | ORAL | Status: DC
  Administered 2016-04-25 – 2016-04-26 (×2): 900 mg via ORAL
  Filled 2016-04-25: qty 3
  Filled 2016-04-25: qty 4

## 2016-04-25 MED ORDER — ENOXAPARIN SODIUM 40 MG/0.4ML ~~LOC~~ SOLN
40.0000 mg | SUBCUTANEOUS | Status: DC
  Administered 2016-04-25 – 2016-04-27 (×3): 40 mg via SUBCUTANEOUS
  Filled 2016-04-25 (×3): qty 0.4

## 2016-04-25 MED ORDER — PROMETHAZINE HCL 25 MG PO TABS: 12.5000 mg | ORAL_TABLET | Freq: Four times a day (QID) | ORAL | Status: DC | PRN

## 2016-04-25 MED ORDER — BISACODYL 5 MG PO TBEC: 5.0000 mg | DELAYED_RELEASE_TABLET | Freq: Every day | ORAL | Status: DC | PRN

## 2016-04-25 MED ORDER — ACETAMINOPHEN 325 MG PO TABS
650.0000 mg | ORAL_TABLET | Freq: Four times a day (QID) | ORAL | Status: DC | PRN
  Administered 2016-04-26: 650 mg via ORAL
  Filled 2016-04-25: qty 2

## 2016-04-25 MED ORDER — IBUPROFEN 800 MG PO TABS
800.0000 mg | ORAL_TABLET | Freq: Four times a day (QID) | ORAL | Status: DC | PRN
  Administered 2016-04-28: 800 mg via ORAL
  Filled 2016-04-25 (×2): qty 1

## 2016-04-25 MED ORDER — VITAMIN B-1 100 MG PO TABS
100.0000 mg | ORAL_TABLET | Freq: Every day | ORAL | Status: DC
  Administered 2016-04-26 – 2016-04-28 (×3): 100 mg via ORAL
  Filled 2016-04-25 (×4): qty 1

## 2016-04-25 MED ORDER — VITAMIN B-12 1000 MCG PO TABS
1000.0000 ug | ORAL_TABLET | Freq: Every day | ORAL | Status: DC
  Administered 2016-04-26 – 2016-04-28 (×3): 1000 ug via ORAL
  Filled 2016-04-25 (×3): qty 1

## 2016-04-25 MED ORDER — VITAMIN B-12 1000 MCG PO TABS: 1000.0000 ug | ORAL_TABLET | Freq: Every day | ORAL | Status: DC

## 2016-04-25 MED ORDER — ACETAMINOPHEN 650 MG RE SUPP: 650.0000 mg | Freq: Four times a day (QID) | RECTAL | Status: DC | PRN

## 2016-04-25 MED ORDER — FOLIC ACID 1 MG PO TABS
1.0000 mg | ORAL_TABLET | Freq: Every day | ORAL | Status: DC
  Administered 2016-04-26 – 2016-04-28 (×3): 1 mg via ORAL
  Filled 2016-04-25 (×4): qty 1

## 2016-04-25 MED ORDER — GABAPENTIN 300 MG PO CAPS: 900.0000 mg | ORAL_CAPSULE | Freq: Every day | ORAL | Status: DC

## 2016-04-25 MED ORDER — FAMOTIDINE 20 MG PO TABS
20.0000 mg | ORAL_TABLET | Freq: Two times a day (BID) | ORAL | Status: DC
  Administered 2016-04-26 – 2016-04-28 (×5): 20 mg via ORAL
  Filled 2016-04-25 (×6): qty 1

## 2016-04-25 MED ORDER — VITAMIN D3 25 MCG (1000 UNIT) PO TABS
2000.0000 [IU] | ORAL_TABLET | Freq: Every day | ORAL | Status: DC
  Administered 2016-04-26 – 2016-04-28 (×3): 2000 [IU] via ORAL
  Filled 2016-04-25 (×3): qty 2

## 2016-04-25 MED ORDER — LORAZEPAM 2 MG/ML IJ SOLN
2.0000 mg | INTRAMUSCULAR | Status: DC | PRN
  Administered 2016-04-25 – 2016-04-28 (×7): 2 mg via INTRAVENOUS
  Filled 2016-04-25 (×7): qty 1

## 2016-04-25 MED ORDER — ONDANSETRON HCL 4 MG/2ML IJ SOLN
4.0000 mg | Freq: Once | INTRAMUSCULAR | Status: AC
  Administered 2016-04-25: 4 mg via INTRAVENOUS
  Filled 2016-04-25: qty 2

## 2016-04-25 MED ORDER — BUPROPION HCL ER (SR) 100 MG PO TB12
200.0000 mg | ORAL_TABLET | Freq: Two times a day (BID) | ORAL | Status: DC
  Administered 2016-04-26 – 2016-04-28 (×5): 200 mg via ORAL
  Filled 2016-04-25 (×6): qty 2

## 2016-04-25 MED ORDER — MULTIVITAMINS PO CAPS: 1.0000 | ORAL_CAPSULE | Freq: Every day | ORAL | Status: DC

## 2016-04-25 MED ORDER — THIAMINE HCL 100 MG/ML IJ SOLN
100.0000 mg | Freq: Once | INTRAMUSCULAR | Status: AC
  Administered 2016-04-25: 100 mg via INTRAVENOUS
  Filled 2016-04-25: qty 2

## 2016-04-25 MED ORDER — ESCITALOPRAM OXALATE 20 MG PO TABS
20.0000 mg | ORAL_TABLET | Freq: Every day | ORAL | Status: DC
  Administered 2016-04-26 – 2016-04-28 (×3): 20 mg via ORAL
  Filled 2016-04-25 (×3): qty 1

## 2016-04-25 MED ORDER — ADULT MULTIVITAMIN W/MINERALS CH
1.0000 | ORAL_TABLET | Freq: Every day | ORAL | Status: DC
  Administered 2016-04-26 – 2016-04-28 (×3): 1 via ORAL
  Filled 2016-04-25 (×3): qty 1

## 2016-04-25 MED ORDER — KCL IN DEXTROSE-NACL 20-5-0.9 MEQ/L-%-% IV SOLN
INTRAVENOUS | Status: DC
  Administered 2016-04-25 – 2016-04-26 (×2): via INTRAVENOUS
  Administered 2016-04-26: 125 mL/h via INTRAVENOUS
  Administered 2016-04-26: 16:00:00 via INTRAVENOUS
  Administered 2016-04-27: 125 mL/h via INTRAVENOUS
  Administered 2016-04-27 – 2016-04-28 (×2): via INTRAVENOUS
  Filled 2016-04-25 (×12): qty 1000

## 2016-04-25 MED ORDER — SODIUM CHLORIDE 0.9 % IV BOLUS (SEPSIS)
2000.0000 mL | Freq: Once | INTRAVENOUS | Status: AC
  Administered 2016-04-25: 2000 mL via INTRAVENOUS

## 2016-04-25 NOTE — ED Notes (Signed)
ED Provider at bedside. 

## 2016-04-25 NOTE — Plan of Care (Signed)
Please call the floor manager for admitting physician assignment as soon as the patient arrives to the floor.   Accepted to telemetry bed as an inpatient for alcohol withdrawal and palpitations.   Triage nursing staff comments: Pt is heavy drinker and states she feels this is causing her problems. She is interested in getting help but unsure about rehab.   Per Dr. Winfred Leeds.  Chief Complaint    Chief Complaint  Patient presents with  . Palpitations    HPI Kathryn Underwood is a 56 y.o. female.Patient is felt her heart racing for the past 2 weeks accounted by dry heaves. She reports that she feels extremely anxious and tremulous. Her husband further elaborates that she has been drinking less over the past 2 weeks because she cannot get to the liquor store. She feels as if she is withdrawing from alcohol. She is been drinking less alcohol that she cannot drive secondary to broken foot. She last reports drinking 2 glasses of wine after breakfast today. Nothing makes symptoms better or worse. She presents today as she's had dry heaves and "I'm scared" denies chest pain denies shortness of breath. No other associated symptoms    Component Value Units  Comprehensive metabolic panel [[id number]] (Abnormal) Collected: 04/25/16 1459  Updated: 04/25/16 1558   Specimen Type: Blood    Sodium 137 mmol/L   Potassium 4.5 mmol/L   Chloride 102 mmol/L   CO2 22 mmol/L   Glucose, Bld 99 mg/dL   BUN 19 mg/dL   Creatinine, Ser 0.79 mg/dL   Calcium 9.5 mg/dL   Total Protein 7.6 g/dL   Albumin 4.4 g/dL   AST 95 (H) U/L   ALT 74 (H) U/L   Alkaline Phosphatase 80 U/L   Total Bilirubin 0.6 mg/dL   GFR calc non Af Amer >60 mL/min   GFR calc Af Amer >60 mL/min   Anion gap 13  Magnesium [[id number]] Collected: 04/25/16 1459  Updated: 04/25/16 1558   Specimen Type: Blood    Magnesium 2.1 mg/dL  Ethanol [[id number]] (Abnormal) Collected: 04/25/16 1459  Updated: 04/25/16 1538   Specimen Type: Blood    Alcohol, Ethyl (B) 28 (H) mg/dL  Rapid urine drug screen (hospital performed) [[id number]] (Abnormal) Collected: 04/25/16 1459  Updated: 04/25/16 1537   Specimen Type: Urine   Specimen Source: Urine, Random    Opiates NONE DETECTED   Cocaine POSITIVE (A)   Benzodiazepines NONE DETECTED   Amphetamines NONE DETECTED   Tetrahydrocannabinol POSITIVE (A)   Barbiturates NONE DETECTED  CBC with Differential/Platelet [[id number]] (Abnormal) Collected: 04/25/16 1459  Updated: 04/25/16 1520   Specimen Type: Blood    WBC 11.7 (H) K/uL   RBC 4.22 MIL/uL   Hemoglobin 14.6 g/dL   HCT 43.2 %   MCV 102.4 (H) fL   MCH 34.6 (H) pg   MCHC 33.8 g/dL   RDW 12.7 %   Platelets 453 (H) K/uL   Neutrophils Relative % 72 %   Neutro Abs 8.5 (H) K/uL   Lymphocytes Relative 19 %   Lymphs Abs 2.2 K/uL   Monocytes Relative 8 %   Monocytes Absolute 0.9 K/uL   Eosinophils Relative 1 %   Eosinophils Absolute 0.1 K/uL   Basophils Relative 0 %   Basophils Absolute 0.0 K/uL   Vent. rate 110 BPM PR interval 156 ms QRS duration 80 ms QT/QTc 340/460 ms P-R-T axes 60 84 61 Sinus tachycardia Otherwise normal EKG  Tennis Must, MD [phone].

## 2016-04-25 NOTE — ED Notes (Signed)
Pt is heavy drinker and states she feels this is causing her problems. She is interested in getting help but unsure about rehab.

## 2016-04-25 NOTE — ED Provider Notes (Signed)
[facility] Provider Note   CSN: [id number] Arrival date & time: 04/25/16  1246     History   Chief Complaint Chief Complaint  Patient presents with  . Palpitations    HPI Kathryn Underwood is a 56 y.o. female.Patient is felt her heart racing for the past 2 weeks accounted by dry heaves. She reports that she feels extremely anxious and tremulous. Her husband further elaborates that she has been drinking less over the past 2 weeks because she cannot get to the liquor store. She feels as if she is withdrawing from alcohol. She is been drinking less alcohol that she cannot drive secondary to broken foot. She last reports drinking 2 glasses of wine after breakfast today. Nothing makes symptoms better or worse. She presents today as she's had dry heaves and "I'm scared" denies chest pain denies shortness of breath. No other associated symptoms  HPI  Past Medical History:  Diagnosis Date  . Anxiety   . Chickenpox   . Depression   . Neuromuscular disorder [facility])    neuropathy    Patient Active Problem List   Diagnosis Date Noted  . Closed nondisplaced fracture of fifth right metatarsal bone 04/08/2016  . Acute pain of right shoulder 04/08/2016  . Vitamin D deficiency 12/11/2015  . Alcohol dependence with unspecified alcohol-induced disorder ([place]) 12/09/2015  . Fatigue 12/09/2015  . Encounter for alcohol abuse counseling and surveillance 12/09/2015  . Tobacco abuse 01/04/2015  . Depression with anxiety 01/04/2015   Alcohol abuse Past Surgical History:  Procedure Laterality Date  . CESAREAN SECTION    . TONSILLECTOMY      OB History    No data available       Home Medications    Prior to Admission medications   Medication Sig Start Date End Date Taking? Authorizing Provider  buPROPion (WELLBUTRIN SR) 200 MG 12 hr tablet Take 1 tablet by mouth 2 (two) times daily after a meal. 10/24/15  Yes Historical Provider, MD  cholecalciferol (VITAMIN D) 400 units TABS  tablet Take 800 Units by mouth daily.   Yes Historical Provider, MD  cyanocobalamin 1000 MCG tablet Take 1,000 mcg by mouth daily.    Yes Historical Provider, MD  escitalopram (LEXAPRO) 20 MG tablet Take 1 tablet (20 mg total) by mouth daily. 01/30/16  Yes Renee A Kuneff, DO  gabapentin (NEURONTIN) 300 MG capsule Take 3-4 capsules by mouth daily. 10/24/15  Yes Historical Provider, MD  Multiple Vitamin (MULTIVITAMIN) capsule Take 1 capsule by mouth daily.   Yes Historical Provider, MD  famotidine (PEPCID) 20 MG tablet Take 1 tablet (20 mg total) by mouth 2 (two) times daily. Take with NSAID 04/08/16   Gerda Diss, DO  ibuprofen (ADVIL,MOTRIN) 800 MG tablet Take 1 tablet (800 mg total) by mouth every 12 (twelve) hours as needed. 04/08/16   Gerda Diss, DO    Family History Family History  Problem Relation Age of Onset  . Dementia Mother   . Arthritis Father   . Asthma Father   . Asthma Brother   . Diabetes Maternal Grandmother   . Dementia Maternal Grandmother   . Diabetes Paternal Grandmother   . Parkinson's disease Maternal Grandfather   . Parkinson's disease Paternal Grandfather   . Breast cancer Neg Hx   . Colon cancer Neg Hx     Social History Social History  Substance Use Topics  . Smoking status: Current Every Day Smoker    Packs/day: 0.50    Types:  Cigarettes  . Smokeless tobacco: Never Used  . Alcohol use 4.8 oz/week    8 Glasses of wine per week     Comment: daily   Drinks one bottle of liquor per day. No illicit drug use  Allergies   Patient has no known allergies.   Review of Systems Review of Systems  Cardiovascular: Positive for palpitations.  Gastrointestinal: Positive for nausea.       Dry heaves  Psychiatric/Behavioral:       Anxiety     Physical Exam Updated Vital Signs BP (!) 142/95   Pulse 90   Temp 98.3 F (36.8 C) (Oral)   Resp 18   Wt 148 lb (67.1 kg)   SpO2 97%   BMI 26.22 kg/m   Physical Exam  Constitutional: She appears  well-developed and well-nourished. She appears distressed.  Anxious appearing tremulous Glasgow Coma Score 15  HENT:  Head: Normocephalic and atraumatic.  Eyes: Conjunctivae are normal. Pupils are equal, round, and reactive to light.  Neck: Neck supple. No tracheal deviation present. No thyromegaly present.  Cardiovascular: Normal rate and regular rhythm.   No murmur heard. Pulmonary/Chest: Effort normal and breath sounds normal.  Abdominal: Soft. Bowel sounds are normal. She exhibits no distension. There is no tenderness.  Musculoskeletal: Normal range of motion. She exhibits no edema or tenderness.  Right lower extremity and walking boot. Left lower extremity in postop shoe  Neurological: She is alert. Coordination normal.  Skin: Skin is warm and dry. No rash noted.  Psychiatric: She has a normal mood and affect.  Nursing note and vitals reviewed.    ED Treatments / Results  Labs (all labs ordered are listed, but only abnormal results are displayed) Labs Reviewed  CBC WITH DIFFERENTIAL/PLATELET  COMPREHENSIVE METABOLIC PANEL  ETHANOL  RAPID URINE DRUG SCREEN, HOSP PERFORMED  MAGNESIUM    EKG  EKG Interpretation  Date/Time:  Saturday April 25 2016 13:08:31 EDT Ventricular Rate:  110 PR Interval:  156 QRS Duration: 80 QT Interval:  340 QTC Calculation: 460 R Axis:   84 Text Interpretation:  Sinus tachycardia Otherwise normal ECG No old tracing to compare Confirmed by Winfred Leeds  MD, Lebert Lovern [id number]) on 04/25/2016 1:17:08 PM       Radiology No results found.  Procedures Procedures (including critical care time)  Medications Ordered in ED Medications  sodium chloride 0.9 % bolus 2,000 mL (not administered)  thiamine (B-1) injection 100 mg (not administered)  LORazepam (ATIVAN) injection 2-3 mg (not administered)    Results for orders placed or performed during the hospital encounter of 04/25/16  CBC with Differential/Platelet  Result Value Ref Range   WBC 11.7 (H)  4.0 - 10.5 K/uL   RBC 4.22 3.87 - 5.11 MIL/uL   Hemoglobin 14.6 12.0 - 15.0 g/dL   HCT 43.2 36.0 - 46.0 %   MCV 102.4 (H) 78.0 - 100.0 fL   MCH 34.6 (H) 26.0 - 34.0 pg   MCHC 33.8 30.0 - 36.0 g/dL   RDW 12.7 11.5 - 15.5 %   Platelets 453 (H) 150 - 400 K/uL   Neutrophils Relative % 72 %   Neutro Abs 8.5 (H) 1.7 - 7.7 K/uL   Lymphocytes Relative 19 %   Lymphs Abs 2.2 0.7 - 4.0 K/uL   Monocytes Relative 8 %   Monocytes Absolute 0.9 0.1 - 1.0 K/uL   Eosinophils Relative 1 %   Eosinophils Absolute 0.1 0.0 - 0.7 K/uL   Basophils Relative 0 %   Basophils Absolute  0.0 0.0 - 0.1 K/uL  Comprehensive metabolic panel  Result Value Ref Range   Sodium 137 135 - 145 mmol/L   Potassium 4.5 3.5 - 5.1 mmol/L   Chloride 102 101 - 111 mmol/L   CO2 22 22 - 32 mmol/L   Glucose, Bld 99 65 - 99 mg/dL   BUN 19 6 - 20 mg/dL   Creatinine, Ser 0.79 0.44 - 1.00 mg/dL   Calcium 9.5 8.9 - 10.3 mg/dL   Total Protein 7.6 6.5 - 8.1 g/dL   Albumin 4.4 3.5 - 5.0 g/dL   AST 95 (H) 15 - 41 U/L   ALT 74 (H) 14 - 54 U/L   Alkaline Phosphatase 80 38 - 126 U/L   Total Bilirubin 0.6 0.3 - 1.2 mg/dL   GFR calc non Af Amer >60 >60 mL/min   GFR calc Af Amer >60 >60 mL/min   Anion gap 13 5 - 15  Ethanol  Result Value Ref Range   Alcohol, Ethyl (B) 28 (H) <5 mg/dL  Rapid urine drug screen (hospital performed)  Result Value Ref Range   Opiates NONE DETECTED NONE DETECTED   Cocaine POSITIVE (A) NONE DETECTED   Benzodiazepines NONE DETECTED NONE DETECTED   Amphetamines NONE DETECTED NONE DETECTED   Tetrahydrocannabinol POSITIVE (A) NONE DETECTED   Barbiturates NONE DETECTED NONE DETECTED  Magnesium  Result Value Ref Range   Magnesium 2.1 1.7 - 2.4 mg/dL   Dg Foot Complete Right  Result Date: 04/23/2016 CLINICAL DATA:  Follow-up fracture. EXAM: RIGHT FOOT COMPLETE - 3+ VIEW COMPARISON:  04/08/2016. FINDINGS: Nondisplaced fracture of the base of the right fifth metatarsal is noted. No interim change in appearance.  No prominent callus formation. Diffuse osteopenia and degenerative change. Osteopenia may have increased slightly from prior exam. This may be from disuse. IMPRESSION: 1. Unchanged fracture of base of the right fifth metatarsal. No significant callus formation. 2. Diffuse osteopenia and degenerative change. Osteopenia may have increased slightly from prior exam. This could be from disuse. Electronically Signed   By: Marcello Moores  Register   On: 04/23/2016 07:08   Dg Foot Complete Right  Result Date: 04/08/2016 CLINICAL DATA:  Status post fall 2 weeks ago, recheck right fifth metatarsal fracture. EXAM: RIGHT FOOT COMPLETE - 3+ VIEW COMPARISON:  Right foot series of March 26, 2016 FINDINGS: Again demonstrated is the transversely oriented comminuted fracture of the base of the fifth metatarsal. Alignment remains near anatomic. No periosteal reaction is observed. The fracture lines are well demonstrated. No other metatarsal fractures are observed. The phalanges and tarsal bones are intact. The joint spaces are reasonably well-maintained. There remains a small amount of soft tissue swelling over the base of the fifth metatarsal. IMPRESSION: No evidence of bony bridging of the fracture of the base of the fifth metatarsal. Alignment remains near anatomic. Electronically Signed   By: David  Martinique M.D.   On: 04/08/2016 10:35   Initial Impression / Assessment and Plan / ED Course  I have reviewed the triage vital signs and the nursing notes. 3:45 PM feels improved after treatment with intravenous Ativan and intravenous fluids. Pertinent labs & imaging results that were available during my care of the patient were reviewed by me and considered in my medical decision making (see chart for details).     Initial CIWA score calculated at 13 Pt signed out to Dr Stark Jock 420 pm she agrees to inpatient detox..  Final Clinical Impressions(s) / ED Diagnoses  Diagnosis #1 acute alcohol withdrawal #2 polysubstance  abuse Final  diagnoses:  None    New Prescriptions New Prescriptions   No medications on file     Orlie Dakin, MD 04/25/16 1624

## 2016-04-25 NOTE — H&P (Signed)
History and Physical    Kathryn Underwood JAS:[id number] DOB: June 27, 1960 DOA: 04/25/2016  Referring MD/NP/PA: EDP PCP: Howard Pouch, DO  Outpatient Specialists: Audrie Gallus, [place] (Psychiatry) Patient coming from: Home  Chief Complaint: Palpitation  HPI: Kathryn Underwood is a 56 y.o. female with medical history significant for but that limited to Heavy Alcohol Abuse and Depression with Anxiety presenting with over 1 week history of palpitations and dry heaves associated with tremulousness of her extremities without any chest pain, shortness of breath, diaphoresis, fever or chills, cough or sputum production, no dysuria, no abdominal pain but has been feeling a little bit nauseated without vomiting  Patient had been in her usual state of health until she sustained right foot fracture (Jones fracture of the zone 2 of the base of the fifth metatarsal) about a month ago for which reason she had been in a cast, and had has some difficulty going to shop to obtain alcohol.her alcohol intake has been diminishing significantly over last few weeks.  She felt that she might be withdrawing and had taken some wine without any significant improvement in his symptoms.she therefore came to the ED to get some help since she was interested in getting alcohol rehabilitation.   ED course:At emergency room patient was noted to be tachycardic and tachypneic with Glasgow coma score of 15, and anxious appearing.  12-lead EKG was notable for tachycardia without any acute ST-T wave changes, UDS WAS POSITIVE FOR COCAINE AND THC. She was treated with IV fluids and Ativan with improvement of his symptoms. Patient admitted for further treatment of alcohol withdrawal and polysubstance abuse  Review of Systems: As per HPI otherwise 10 point review of systems negative.   Past Medical History:  Diagnosis Date  . Anxiety   . Chickenpox   . Depression   . Neuromuscular disorder ([place])    neuropathy    Past Surgical  History:  Procedure Laterality Date  . CESAREAN SECTION    . TONSILLECTOMY       reports that she has been smoking Cigarettes.  She has been smoking about 0.50 packs per day. She has never used smokeless tobacco. She reports that she drinks about 4.8 oz of alcohol per week . She reports that she does not use drugs.  No Known Allergies  Family History  Problem Relation Age of Onset  . Dementia Mother   . Arthritis Father   . Asthma Father   . Asthma Brother   . Diabetes Maternal Grandmother   . Dementia Maternal Grandmother   . Diabetes Paternal Grandmother   . Parkinson's disease Maternal Grandfather   . Parkinson's disease Paternal Grandfather   . Breast cancer Neg Hx   . Colon cancer Neg Hx     Prior to Admission medications   Medication Sig Start Date End Date Taking? Authorizing Provider  buPROPion (WELLBUTRIN SR) 200 MG 12 hr tablet Take 1 tablet by mouth 2 (two) times daily after a meal. 10/24/15  Yes Historical Provider, MD  cholecalciferol (VITAMIN D) 1000 units tablet Take 2,000 Units by mouth daily.   Yes Historical Provider, MD  cyanocobalamin 1000 MCG tablet Take 1,000 mcg by mouth daily.    Yes Historical Provider, MD  escitalopram (LEXAPRO) 20 MG tablet Take 1 tablet (20 mg total) by mouth daily. 01/30/16  Yes Renee A Kuneff, DO  famotidine (PEPCID) 20 MG tablet Take 1 tablet (20 mg total) by mouth 2 (two) times daily. Take with NSAID 04/08/16  Yes Juanda Bond  Paulla Fore, DO  gabapentin (NEURONTIN) 300 MG capsule Take 3-4 capsules by mouth daily. 10/24/15  Yes Historical Provider, MD  ibuprofen (ADVIL,MOTRIN) 800 MG tablet Take 1 tablet (800 mg total) by mouth every 12 (twelve) hours as needed. 04/08/16  Yes Gerda Diss, DO  Multiple Vitamin (MULTIVITAMIN) capsule Take 1 capsule by mouth daily.   Yes Historical Provider, MD    Physical Exam: Vitals:   04/25/16 1815 04/25/16 1830 04/25/16 1832 04/25/16 1917  BP:  (!) 138/93  127/79  Pulse: (!) 102 (!) 116 (!) 110 (!) 102    Resp: (!) 22 (!) 22 20 (!) 22  Temp:    98.5 F (36.9 C)  TempSrc:    Oral  SpO2: 96% 95% 98% 94%  Weight:          Constitutional: NAD, calm, comfortable Vitals:   04/25/16 1815 04/25/16 1830 04/25/16 1832 04/25/16 1917  BP:  (!) 138/93  127/79  Pulse: (!) 102 (!) 116 (!) 110 (!) 102  Resp: (!) 22 (!) 22 20 (!) 22  Temp:    98.5 F (36.9 C)  TempSrc:    Oral  SpO2: 96% 95% 98% 94%  Weight:       Eyes: PERRL, lids and conjunctivae normal ENMT: Mucous membranes are moist. Posterior pharynx clear of any exudate or lesions.Normal dentition.  Neck: normal, supple, no masses, no thyromegaly Respiratory: clear to auscultation bilaterally, no wheezing, no crackles. Normal respiratory effort. No accessory muscle use.  Cardiovascular: mildly tachycardic, regular rhythm, no murmurs / rubs / gallops. No extremity edema. 2+ pedal pulses. No carotid bruits.  Abdomen: no tenderness, no masses palpated. No hepatosplenomegaly. Bowel sounds positive.  Musculoskeletal: no clubbing / cyanosis.Lower extremity boots Normal muscle tone.  Skin: no rashes, lesions, ulcers. No induration Neurologic: CN 2-12 grossly intact. Sensation intact, DTR normal. Strength 5/5 in all 4.  Psychiatric: Normal judgment and insight. Alert and oriented x 3. Normal mood.     Labs on Admission: I have personally reviewed following labs and imaging studies  CBC:  Recent Labs Lab 04/25/16 1459  WBC 11.7*  NEUTROABS 8.5*  HGB 14.6  HCT 43.2  MCV 102.4*  PLT 956*   Basic Metabolic Panel:  Recent Labs Lab 04/25/16 1459  NA 137  K 4.5  CL 102  CO2 22  GLUCOSE 99  BUN 19  CREATININE 0.79  CALCIUM 9.5  MG 2.1   GFR: Estimated Creatinine Clearance: 71.4 mL/min (by C-G formula based on SCr of 0.79 mg/dL). Liver Function Tests:  Recent Labs Lab 04/25/16 1459  AST 95*  ALT 74*  ALKPHOS 80  BILITOT 0.6  PROT 7.6  ALBUMIN 4.4   No results for input(s): LIPASE, AMYLASE in the last 168 hours. No  results for input(s): AMMONIA in the last 168 hours. Coagulation Profile: No results for input(s): INR, PROTIME in the last 168 hours. Cardiac Enzymes: No results for input(s): CKTOTAL, CKMB, CKMBINDEX, TROPONINI in the last 168 hours. BNP (last 3 results) No results for input(s): PROBNP in the last 8760 hours. HbA1C: No results for input(s): HGBA1C in the last 72 hours. CBG: No results for input(s): GLUCAP in the last 168 hours. Lipid Profile: No results for input(s): CHOL, HDL, LDLCALC, TRIG, CHOLHDL, LDLDIRECT in the last 72 hours. Thyroid Function Tests: No results for input(s): TSH, T4TOTAL, FREET4, T3FREE, THYROIDAB in the last 72 hours. Anemia Panel: No results for input(s): VITAMINB12, FOLATE, FERRITIN, TIBC, IRON, RETICCTPCT in the last 72 hours. Urine analysis:  Component Value Date/Time   COLORURINE YELLOW 01/23/2010 1750   APPEARANCEUR CLEAR 01/23/2010 1750   LABSPEC 1.024 01/23/2010 1750   PHURINE 6.0 01/23/2010 1750   GLUCOSEU NEGATIVE 01/23/2010 1750   HGBUR NEGATIVE 01/23/2010 1750   BILIRUBINUR NEGATIVE 01/23/2010 1750   KETONESUR 15 (A) 01/23/2010 1750   PROTEINUR NEGATIVE 01/23/2010 1750   UROBILINOGEN 0.2 01/23/2010 1750   NITRITE NEGATIVE 01/23/2010 1750   LEUKOCYTESUR  01/23/2010 1750    NEGATIVE MICROSCOPIC NOT DONE ON URINES WITH NEGATIVE PROTEIN, BLOOD, LEUKOCYTES, NITRITE, OR GLUCOSE <1000 mg/dL.   Sepsis Labs: @LABRCNTIP (procalcitonin:4,lacticidven:4) )No results found for this or any previous visit (from the past 240 hour(s)).   Radiological Exams on Admission: No results found.  EKG: Independently reviewed.   Assessment/Plan Active Problems:   Alcohol withdrawal (HCC)   Depression with anxiety   Alcohol dependence with unspecified alcohol-induced disorder (HCC)   Tachycardia   Transaminitis   Leukocytosis  #1 Acohol Withdrawal/Alcohol Dependence: IV/oral benzo prn IV fluids Thiamine and folate Supportive care  #2 Polysubstance  Abuse: Abuse of alcohol, cocaine and marijuana Counseling Outpatient rehabilitation  #3 Leukocytosis with Tachycardia: Mild No clinical evidence of SIRS Check lactic acid level Check UA with possible culture  #4 Transaminitis: Likely due to alcohol Check hepatitis panel with outpatient follow-up  #5 Depression with Anxiety: Continue home medication I agree with recent discontinuation of lorazepam Gabapentin helpful with history of alcohol abuse Outpatient follow-up with her psychiatrist  DVT prophylaxis: (Lovenox) Code Status: (Full) Family Communication: Valentina Gu, Husband ( tel # [phone]) Disposition Plan: to be determined Consults called: Social Work Admission status: (obs / tele)   OSEI-BONSU,Liboria Putnam MD [facility] Pager [phone]  If 7PM-7AM, please contact night-coverage [email or web address] Password [facility]  04/25/2016, 8:29 PM

## 2016-04-25 NOTE — ED Triage Notes (Signed)
Palpitations x 1 week. Also reports dry heaves today and being treated for L broken foot.

## 2016-04-25 NOTE — ED Notes (Signed)
Pt ambulating to BR with stand-by assistance, in NAD.

## 2016-04-25 NOTE — Progress Notes (Signed)
Pt was relaxing in bed when I arrived; she was awake and alert. She said she was not feeling well and thinks it is coming from detox. She said she drinks alcohol and said she had not had much lately b/c she could not get to the store. She said she cannot drive right now because she broke her foot. She explained how her dog, who had killed her cat, saw a squirrel, or something, while she was ckng the mail and yanked his leash, pulling her causing her to break her foot and she described other injuries to her ribs, muscles, shoulders. While she was talking she received a call, at which time I gave her privacy. Just before I returned, she received a visitor and food. I left, allowing her to visit but offered a return visit. Please page if assistance is needed prior to my next visit. She was very grateful for visit and open to another. [place], M.Div.  [phone]   04/25/16 2100  Clinical Encounter Type  Visited With Patient

## 2016-04-26 DIAGNOSIS — R000 Tachycardia, unspecified: Secondary | ICD-10-CM

## 2016-04-26 DIAGNOSIS — D72829 Elevated white blood cell count, unspecified: Secondary | ICD-10-CM

## 2016-04-26 DIAGNOSIS — F1029 Alcohol dependence with unspecified alcohol-induced disorder: Secondary | ICD-10-CM

## 2016-04-26 DIAGNOSIS — F10230 Alcohol dependence with withdrawal, uncomplicated: Secondary | ICD-10-CM

## 2016-04-26 DIAGNOSIS — F418 Other specified anxiety disorders: Secondary | ICD-10-CM

## 2016-04-26 DIAGNOSIS — R740 Nonspecific elevation of levels of transaminase and lactic acid dehydrogenase [LDH]: Secondary | ICD-10-CM

## 2016-04-26 LAB — HIV ANTIBODY (ROUTINE TESTING W REFLEX): HIV SCREEN 4TH GENERATION: NONREACTIVE

## 2016-04-26 LAB — CBC WITH DIFFERENTIAL/PLATELET
Basophils Absolute: 0.1 10*3/uL (ref 0.0–0.1)
Basophils Relative: 1 %
Eosinophils Absolute: 0.2 10*3/uL (ref 0.0–0.7)
Eosinophils Relative: 2 %
HEMATOCRIT: 40.5 % (ref 36.0–46.0)
HEMOGLOBIN: 13.3 g/dL (ref 12.0–15.0)
LYMPHS ABS: 2.4 10*3/uL (ref 0.7–4.0)
LYMPHS PCT: 26 %
MCH: 34.5 pg — AB (ref 26.0–34.0)
MCHC: 32.8 g/dL (ref 30.0–36.0)
MCV: 104.9 fL — AB (ref 78.0–100.0)
Monocytes Absolute: 1 10*3/uL (ref 0.1–1.0)
Monocytes Relative: 11 %
NEUTROS ABS: 5.6 10*3/uL (ref 1.7–7.7)
NEUTROS PCT: 60 %
Platelets: 373 10*3/uL (ref 150–400)
RBC: 3.86 MIL/uL — AB (ref 3.87–5.11)
RDW: 13.2 % (ref 11.5–15.5)
WBC: 9.2 10*3/uL (ref 4.0–10.5)

## 2016-04-26 LAB — URINALYSIS, ROUTINE W REFLEX MICROSCOPIC
Bacteria, UA: NONE SEEN
Bilirubin Urine: NEGATIVE
GLUCOSE, UA: NEGATIVE mg/dL
KETONES UR: NEGATIVE mg/dL
LEUKOCYTES UA: NEGATIVE
Nitrite: NEGATIVE
PH: 5 (ref 5.0–8.0)
PROTEIN: NEGATIVE mg/dL
SQUAMOUS EPITHELIAL / LPF: NONE SEEN
Specific Gravity, Urine: 1.004 — ABNORMAL LOW (ref 1.005–1.030)
WBC, UA: NONE SEEN WBC/hpf (ref 0–5)

## 2016-04-26 LAB — COMPREHENSIVE METABOLIC PANEL
ALK PHOS: 69 U/L (ref 38–126)
ALT: 53 U/L (ref 14–54)
ANION GAP: 7 (ref 5–15)
AST: 60 U/L — ABNORMAL HIGH (ref 15–41)
Albumin: 3.9 g/dL (ref 3.5–5.0)
BILIRUBIN TOTAL: 0.8 mg/dL (ref 0.3–1.2)
BUN: 17 mg/dL (ref 6–20)
CALCIUM: 8.8 mg/dL — AB (ref 8.9–10.3)
CO2: 27 mmol/L (ref 22–32)
Chloride: 106 mmol/L (ref 101–111)
Creatinine, Ser: 0.71 mg/dL (ref 0.44–1.00)
GFR calc Af Amer: >60 mL/min (ref >60)
Glucose, Bld: 104 mg/dL — ABNORMAL HIGH (ref 65–99)
POTASSIUM: 3.7 mmol/L (ref 3.5–5.1)
Sodium: 140 mmol/L (ref 135–145)
TOTAL PROTEIN: 6.2 g/dL — AB (ref 6.5–8.1)

## 2016-04-26 LAB — TROPONIN I
TROPONIN I: 0.03 ng/mL — AB (ref <0.03)
Troponin I: <0.03 ng/mL (ref <0.03)

## 2016-04-26 LAB — GLUCOSE, CAPILLARY: GLUCOSE-CAPILLARY: 111 mg/dL — AB (ref 65–99)

## 2016-04-26 NOTE — Clinical Social Work Note (Signed)
Clinical Social Work Assessment  Patient Details  Name: Kathryn Underwood MRN: [id number] Date of Birth: 17-Feb-1960  Date of referral:  04/26/16               Reason for consult:  Substance Use/ETOH Abuse, Discharge Planning, Community Resources, Emotional/Coping/Adjustment to Illness                Permission sought to share information with:  Case Manager Permission granted to share information::     Name::        Agency::     Relationship::     Contact Information:     Housing/Transportation Living arrangements for the past 2 months:  Apartment Source of Information:  Patient, Medical Team, Case Manager Patient Interpreter Needed:  None Criminal Activity/Legal Involvement Pertinent to Current Situation/Hospitalization:  No - Comment as needed Significant Relationships:  Adult Children, Other Family Members, Significant Other Lives with:  Significant Other Do you feel safe going back to the place where you live?  Yes Need for family participation in patient care:  No (Coment)  Care giving concerns:  No care giving concerns noted by patient at this time. Patient lives at home with her long term boyfriend and is independent with ADLs.  Patient plans to return home at discharge with boyfriend who can provide support.   Social Worker assessment / plan:  LCSW completed consult for Substance abuse.  Met with patient at length at bedside.  Patient was alert and oriented throughout assessment. She defines she has an alcohol problem and has been dealing off and on with alcohol for many years.  In the last 5 years she has completed detox and treatment at [facility]. Reports she followed up with AA for years and had a sponsor, but relapsed and was embarrassed to announce at [place] that she relapsed, thus quit going and in the last 2 years has continued to drink. She reports she drinks about a bottle of wine daily or more depending on the day. Reports she has a dx of depression and spends most days in bed  asleep (if she can or watching tv).  She has limited interactions with others, as her adult children have there own lives and she takes care of her animals.  LCSW assessed for suicidal ideation in which patient declined reporting she would never do that to herself as she has children and had a friend commit suicide in the past and saw the effects.  Reports she has just been depressed/isolating.  Patient reports she follows at [facility] and sees a psychiatrist every 6 months for medications; managed on Wellbutrin and has an appointment coming up.  Patient reports she is interested in therapy and resources/eduction given regarding therapy at [facility] or [place] as well as the [employer], [facility], and [facility]. Flyers given along with contact information.  Lastly, information given about alcohol cessation (booklet). Patient was educated on resources and needs and to follow up in which she reports she is wanting to complete. Patient currently going through DTs AEB shakes, nauesa, dry heaves, and some abdominal pain.  She is managed on CIWA with ativan.  No other needs voiced by patient at this time. She plans to DC home with boyfriend who can come and pick her up.  Most likely 1-2 days pending detox.  Employment status:  Unemployed Forensic scientist:  Self Pay (Medicaid Pending) PT Recommendations:  Not assessed at this time Information / Referral to community resources:  Outpatient Substance Abuse Treatment Options,  Residential Substance Abuse Treatment Options, Outpatient Psychiatric Care (Comment Required), SBIRT  Patient/Family's Response to care:  Appreciative of all information and time.  Patient/Family's Understanding of and Emotional Response to Diagnosis, Current Treatment, and Prognosis:  Patient understands the need to decline from drinking due to her increased depression, broken foot, and medical problems.  She voices frustration in self and inability to manage he depression, but agreeable  and open to resources and support.  Emotional Assessment Appearance:  Appears older than stated age Attitude/Demeanor/Rapport:  Other (tearful, depressed) Affect (typically observed):  Anxious, Overwhelmed, Depressed, Tearful/Crying Orientation:  Oriented to Self, Oriented to Place, Oriented to  Time, Oriented to Situation Alcohol / Substance use:  Tobacco Use, Alcohol Use Psych involvement (Current and /or in the community):  Yes (Comment), Outpatient Provider ([place] outpatient, no inpatient consult necessary)  Discharge Needs  Concerns to be addressed:  Denies Needs/Concerns at this time Readmission within the last 30 days:  No Current discharge risk:  None Barriers to Discharge:  Continued Medical Work up   [place], LCSW 04/26/2016, 11:02 AM

## 2016-04-26 NOTE — Progress Notes (Signed)
PROGRESS NOTE    DEEANN SERVIDIO  UUE:[id number] DOB: November 12, 1960 DOA: 04/25/2016 PCP: Howard Pouch, DO   Brief Narrative: Kathryn Underwood is a 56 y.o. female with medical history significant for but that limited to Heavy Alcohol Abuse and Depression with Anxiety presenting with over 1 week history of palpitations and dry heaves associated with tremulousness of her extremities without any chest pain, shortness of breath, diaphoresis, fever or chills, cough or sputum production, no dysuria, no abdominal pain but has been feeling a little bit nauseated without vomiting  Patient had been in her usual state of health until she sustained right foot fracture (Jones fracture of the zone 2 of the base of the fifth metatarsal) about a month ago for which reason she had been in a cast, and had has some difficulty going to shop to obtain alcohol.her alcohol intake has been diminishing significantly over last few weeks.  She felt that she might be withdrawing and had taken some wine without any significant improvement in his symptoms. She therefore came to the ED to get some help since she was interested in getting alcohol rehabilitation. Patient showing some withdrawal symptoms still and being treated for Alcohol withdrawal and polysubstance abuse.  Assessment & Plan:   Active Problems:   Depression with anxiety   Alcohol dependence with unspecified alcohol-induced disorder (HCC)   Alcohol withdrawal (HCC)   Tachycardia   Transaminitis   Leukocytosis  #1 Acohol Withdrawal/Alcohol Dependence: -Currently still withdrawing and having nausea, abdominal pain and tremors -CIWA Protocol with IV/oral benzo prn -C/w IVF with D5 NS NS + KCl 20 mEQ at 125 mL/hr -C/w MVI,Thiamine, and Folate -Education officer, museum involved; Given resources/education regarding therapy at [employer] [facility] or [facility] as well as Comptroller, Adult nurse, and [facility] -C/w Supportive care with Promethazine for Nausea and Famotidine 20 mg po  BID  #2 Polysubstance Abuse: -UDS Positive for Cocaine, THC -Abuse of alcohol, cocaine and marijuana -Counseling -HIV Negative -Outpatient rehabilitation  #3 Leukocytosis with Tachycardia: -Likely from Withdrawl -Mild -No clinical evidence of SIRS -Check Lactic Acid was 1.1 -TSH was 2.626  #4 Transaminitis: -Likely due to alcohol -Improving -Hepatitis Panel pending  #5 Depression with Anxiety: -Continue home medication of Wellbutrin 200 mg po BID and Lexapro 20 mg po Daily -Recent discontinuation of lorazepam -Gabapentin helpful with history of alcohol abuse -Outpatient follow-up with her psychiatrist -Social Work to provide Resources  DVT prophylaxis: Lovenox 40 mg sq q24h Code Status: FULL CODE Family Communication: No family present at bedside Disposition Plan: Home pending Detox  Consultants:   None   Procedures: None  Antimicrobials:  Anti-infectives    None     Subjective: Seen and examined and was having tremors and felt nauseous. No CP or SOB but had some abdominal pain. No other concerns or complaints but wants help with her drinking.  Objective: Vitals:   04/25/16 1917 04/25/16 2011 04/25/16 2213 04/26/16 0544  BP: 127/79  131/62 111/75  Pulse: (!) 102  (!) 101 92  Resp: (!) 22  20 20   Temp: 98.5 F (36.9 C)  98.2 F (36.8 C) 98.4 F (36.9 C)  TempSrc: Oral  Oral Oral  SpO2: 94%  94% 97%  Weight:  67.1 kg (147 lb 14.9 oz)    Height:  5\' 3"  (1.6 m)      Intake/Output Summary (Last 24 hours) at 04/26/16 0816 Last data filed at 04/26/16 0600  Gross per 24 hour  Intake  3480 ml  Output                0 ml  Net             3480 ml   Filed Weights   04/25/16 1308 04/25/16 2011  Weight: 67.1 kg (148 lb) 67.1 kg (147 lb 14.9 oz)   Examination: Physical Exam:  Constitutional: NAD but has tremors Eyes: Lids and conjunctivae normal, sclerae anicteric  ENMT: External Ears, Nose appear normal. Grossly normal hearing.   Neck:  Appears normal, supple, no cervical masses, normal ROM, no appreciable thyromegaly, no JVD Respiratory: Diminished to auscultation bilaterally, no wheezing, rales, rhonchi or crackles. Normal respiratory effort and patient is not tachypenic. No accessory muscle use.  Cardiovascular: Tachycardic rate but sinus rhythm, no murmurs / rubs / gallops. S1 and S2 auscultated. No extremity edema.  Abdomen: Soft, tender, non-distended. No masses palpated. No appreciable hepatosplenomegaly. Bowel sounds positive.  GU: Deferred. Musculoskeletal: No clubbing / cyanosis of digits/nails Right Leg in a cast/boot. Skin: No rashes, lesions, ulcers on limited skin evaluation. No induration; Warm and dry.  Neurologic: CN 2-12 grossly intact with no focal deficits. Sensation intact in all 4 Extremities. Had some tremors  Psychiatric: Normal judgment and insight. Alert and oriented x 3. Normal mood and appropriate affect.   Data Reviewed: I have personally reviewed following labs and imaging studies  CBC:  Recent Labs Lab 04/25/16 1459 04/25/16 2045  WBC 11.7* 9.6  NEUTROABS 8.5*  --   HGB 14.6 12.1  HCT 43.2 36.8  MCV 102.4* 103.7*  PLT 453* 270*   Basic Metabolic Panel:  Recent Labs Lab 04/25/16 1459 04/25/16 2045 04/26/16 0205  NA 137  --  140  K 4.5  --  3.7  CL 102  --  106  CO2 22  --  27  GLUCOSE 99  --  104*  BUN 19  --  17  CREATININE 0.79 0.70 0.71  CALCIUM 9.5  --  8.8*  MG 2.1 2.0  --   PHOS  --  3.5  --    GFR: Estimated Creatinine Clearance: 71.4 mL/min (by C-G formula based on SCr of 0.71 mg/dL). Liver Function Tests:  Recent Labs Lab 04/25/16 1459 04/26/16 0205  AST 95* 60*  ALT 74* 53  ALKPHOS 80 69  BILITOT 0.6 0.8  PROT 7.6 6.2*  ALBUMIN 4.4 3.9   No results for input(s): LIPASE, AMYLASE in the last 168 hours. No results for input(s): AMMONIA in the last 168 hours. Coagulation Profile: No results for input(s): INR, PROTIME in the last 168 hours. Cardiac  Enzymes:  Recent Labs Lab 04/25/16 2045 04/26/16 0205  TROPONINI <0.03 0.03*   BNP (last 3 results) No results for input(s): PROBNP in the last 8760 hours. HbA1C: No results for input(s): HGBA1C in the last 72 hours. CBG:  Recent Labs Lab 04/26/16 0736  GLUCAP 111*   Lipid Profile: No results for input(s): CHOL, HDL, LDLCALC, TRIG, CHOLHDL, LDLDIRECT in the last 72 hours. Thyroid Function Tests:  Recent Labs  04/25/16 2045  TSH 2.626   Anemia Panel: No results for input(s): VITAMINB12, FOLATE, FERRITIN, TIBC, IRON, RETICCTPCT in the last 72 hours. Sepsis Labs:  Recent Labs Lab 04/25/16 2045  LATICACIDVEN 1.1    No results found for this or any previous visit (from the past 240 hour(s)).   Radiology Studies: No results found.  Scheduled Meds: . buPROPion  200 mg Oral BID PC  . cholecalciferol  2,000  Units Oral Daily  . enoxaparin (LOVENOX) injection  40 mg Subcutaneous Q24H  . escitalopram  20 mg Oral Daily  . famotidine  20 mg Oral BID  . folic acid  1 mg Oral Daily  . gabapentin  900-1,200 mg Oral QHS  . multivitamin with minerals  1 tablet Oral Daily  . thiamine  100 mg Oral Daily  . vitamin B-12  1,000 mcg Oral Daily   Continuous Infusions: . dextrose 5 % and 0.9 % NaCl with KCl 20 mEq/L 125 mL/hr at 04/26/16 0643     LOS: 1 day   Kerney Elbe, DO [facility] Pager [phone]  If 7PM-7AM, please contact night-coverage [email or web address] Password [email or web address] 04/26/2016, 8:16 AM

## 2016-04-26 NOTE — Progress Notes (Signed)
PROGRESS NOTE    Kathryn Underwood  UEK:[id number] DOB: 1960/07/27 DOA: 04/25/2016 PCP: Howard Pouch, DO   Brief Narrative: Kathryn Underwood is a 56 y.o. female with medical history significant for but that limited to Heavy Alcohol Abuse and Depression with Anxiety presenting with over 1 week history of palpitations and dry heaves associated with tremulousness of her extremities without any chest pain, shortness of breath, diaphoresis, fever or chills, cough or sputum production, no dysuria, no abdominal pain but has been feeling a little bit nauseated without vomiting  Patient had been in her usual state of health until she sustained right foot fracture (Jones fracture of the zone 2 of the base of the fifth metatarsal) about a month ago for which reason she had been in a cast, and had has some difficulty going to shop to obtain alcohol.her alcohol intake has been diminishing significantly over last few weeks.  She felt that she might be withdrawing and had taken some wine without any significant improvement in his symptoms. She therefore came to the ED to get some help since she was interested in getting alcohol rehabilitation. Patient showing some withdrawal symptoms still and being treated for Alcohol withdrawal and polysubstance abuse.  Assessment & Plan:   Active Problems:   Depression with anxiety   Alcohol dependence with unspecified alcohol-induced disorder (HCC)   Alcohol withdrawal (HCC)   Tachycardia   Transaminitis   Leukocytosis  #1 Acohol Withdrawal/Alcohol Dependence: -Currently still withdrawing and having nausea, abdominal pain and tremors -CIWA Protocol with IV/oral benzo prn -C/w IVF with D5 NS NS + KCl 20 mEQ at 125 mL/hr -C/w MVI,Thiamine, and Folate -Education officer, museum involved; Given resources/education regarding therapy at [employer] or [employer] as well as Comptroller, Adult nurse, and MHA -C/w Supportive care with Promethazine for Nausea and Famotidine 20 mg po  BID  #2 Polysubstance Abuse: -UDS Positive for Cocaine, THC -Abuse of alcohol, cocaine and marijuana -Counseling -HIV Negative -Outpatient rehabilitation  #3 Leukocytosis with Tachycardia: -Likely from Withdrawl -Mild -No clinical evidence of SIRS -Check Lactic Acid was 1.1 -TSH was 2.626  #4 Transaminitis: -Likely due to alcohol -Improving -Hepatitis Panel pending  #5 Depression with Anxiety: -Continue home medication of Wellbutrin 200 mg po BID and Lexapro 20 mg po Daily -Recent discontinuation of lorazepam -Gabapentin helpful with history of alcohol abuse -Outpatient follow-up with her psychiatrist -Social Work to provide Resources  DVT prophylaxis: Lovenox 40 mg sq q24h Code Status: FULL CODE Family Communication: No family present at bedside Disposition Plan: Home pending Detox  Consultants:   None   Procedures: None  Antimicrobials:  Anti-infectives    None     Subjective: Seen and examined and was having tremors and felt nauseous. No CP or SOB but had some abdominal pain. No other concerns or complaints but wants help with her drinking.  Objective: Vitals:   04/25/16 2011 04/25/16 2213 04/26/16 0544 04/26/16 1520  BP:  131/62 111/75 126/82  Pulse:  (!) 101 92 (!) 107  Resp:  20 20 20   Temp:  98.2 F (36.8 C) 98.4 F (36.9 C) 98.4 F (36.9 C)  TempSrc:  Oral Oral Oral  SpO2:  94% 97% 99%  Weight: 67.1 kg (147 lb 14.9 oz)     Height: 5\' 3"  (1.6 m)       Intake/Output Summary (Last 24 hours) at 04/26/16 1914 Last data filed at 04/26/16 1835  Gross per 24 hour  Intake  3695 ml  Output                0 ml  Net             3695 ml   Filed Weights   04/25/16 1308 04/25/16 2011  Weight: 67.1 kg (148 lb) 67.1 kg (147 lb 14.9 oz)   Examination: Physical Exam:  Constitutional: NAD but has tremors Eyes: Lids and conjunctivae normal, sclerae anicteric  ENMT: External Ears, Nose appear normal. Grossly normal hearing.   Neck:  Appears normal, supple, no cervical masses, normal ROM, no appreciable thyromegaly, no JVD Respiratory: Diminished to auscultation bilaterally, no wheezing, rales, rhonchi or crackles. Normal respiratory effort and patient is not tachypenic. No accessory muscle use.  Cardiovascular: Tachycardic rate but sinus rhythm, no murmurs / rubs / gallops. S1 and S2 auscultated. No extremity edema.  Abdomen: Soft, mildly tender, non-distended. No masses palpated. No appreciable hepatosplenomegaly. Bowel sounds positive.  GU: Deferred. Musculoskeletal: No clubbing / cyanosis of digits/nails. Right foot in boot Skin: No rashes, lesions, ulcers on limited skin evaluation. No induration; Warm and dry.  Neurologic: CN 2-12 grossly intact with no focal deficits. Sensation intact in all 4 Extremities. Patient had some tremors. Psychiatric: Normal judgment and insight. Alert and oriented x 3. Normal mood and appropriate affect.   Data Reviewed: I have personally reviewed following labs and imaging studies  CBC:  Recent Labs Lab 04/25/16 1459 04/25/16 2045 04/26/16 0840  WBC 11.7* 9.6 9.2  NEUTROABS 8.5*  --  5.6  HGB 14.6 12.1 13.3  HCT 43.2 36.8 40.5  MCV 102.4* 103.7* 104.9*  PLT 453* 420* 939   Basic Metabolic Panel:  Recent Labs Lab 04/25/16 1459 04/25/16 2045 04/26/16 0205  NA 137  --  140  K 4.5  --  3.7  CL 102  --  106  CO2 22  --  27  GLUCOSE 99  --  104*  BUN 19  --  17  CREATININE 0.79 0.70 0.71  CALCIUM 9.5  --  8.8*  MG 2.1 2.0  --   PHOS  --  3.5  --    GFR: Estimated Creatinine Clearance: 71.4 mL/min (by C-G formula based on SCr of 0.71 mg/dL). Liver Function Tests:  Recent Labs Lab 04/25/16 1459 04/26/16 0205  AST 95* 60*  ALT 74* 53  ALKPHOS 80 69  BILITOT 0.6 0.8  PROT 7.6 6.2*  ALBUMIN 4.4 3.9   No results for input(s): LIPASE, AMYLASE in the last 168 hours. No results for input(s): AMMONIA in the last 168 hours. Coagulation Profile: No results for  input(s): INR, PROTIME in the last 168 hours. Cardiac Enzymes:  Recent Labs Lab 04/25/16 2045 04/26/16 0205 04/26/16 0840  TROPONINI <0.03 0.03* <0.03   BNP (last 3 results) No results for input(s): PROBNP in the last 8760 hours. HbA1C: No results for input(s): HGBA1C in the last 72 hours. CBG:  Recent Labs Lab 04/26/16 0736  GLUCAP 111*   Lipid Profile: No results for input(s): CHOL, HDL, LDLCALC, TRIG, CHOLHDL, LDLDIRECT in the last 72 hours. Thyroid Function Tests:  Recent Labs  04/25/16 2045  TSH 2.626   Anemia Panel: No results for input(s): VITAMINB12, FOLATE, FERRITIN, TIBC, IRON, RETICCTPCT in the last 72 hours. Sepsis Labs:  Recent Labs Lab 04/25/16 2045  LATICACIDVEN 1.1   No results found for this or any previous visit (from the past 240 hour(s)).   Radiology Studies: No results found.  Scheduled Meds: . buPROPion  200 mg Oral BID PC  . cholecalciferol  2,000 Units Oral Daily  . enoxaparin (LOVENOX) injection  40 mg Subcutaneous Q24H  . escitalopram  20 mg Oral Daily  . famotidine  20 mg Oral BID  . folic acid  1 mg Oral Daily  . gabapentin  900-1,200 mg Oral QHS  . multivitamin with minerals  1 tablet Oral Daily  . thiamine  100 mg Oral Daily  . vitamin B-12  1,000 mcg Oral Daily   Continuous Infusions: . dextrose 5 % and 0.9 % NaCl with KCl 20 mEq/L 125 mL/hr at 04/26/16 1615    LOS: 1 day   Kerney Elbe, DO [facility] Pager [phone]  If 7PM-7AM, please contact night-coverage [email or web address] Password [facility] 04/26/2016, 7:14 PM

## 2016-04-26 NOTE — Progress Notes (Signed)
Pt was lying in bed, awake and alert when I arrived. Pt was very tearful today. She said she has asked God to just take her.  She said she is afraid that she can't do it. She said she knows she is not ready to go home and needs to detox. She said a friend had mentioned [facility]. She said she is afraid she will fail and disappoint her son. We talked about her doing this for herself. She said she and her daughter do not have a good relationship b/c her daughter won't work and depends on her (pt's) boyfriend to take care of her. She said she doesn't have friends anymore and is afraid her son won't believe her. Pt was tearful throughout most of visit, almost to the point of sobbing. We talked about her fear as being "false evidence appearing real."  She said that was very helpful (when put into perspective).  Pt asked for prayer, especially for her fear. She had a Bible bedside and said she would be reading it tonight.  After prayer she was again tearful, but very thankful. [place] offered continued support and will refer her to day Chaplains. Please page if additional support is needed prior to next visit.  [place], M.Div.   04/26/16 1700  Clinical Encounter Type  Visited With Patient

## 2016-04-27 DIAGNOSIS — Z711 Person with feared health complaint in whom no diagnosis is made: Secondary | ICD-10-CM

## 2016-04-27 LAB — COMPREHENSIVE METABOLIC PANEL
ALT: 42 U/L (ref 14–54)
ANION GAP: 6 (ref 5–15)
AST: 42 U/L — ABNORMAL HIGH (ref 15–41)
Albumin: 3.7 g/dL (ref 3.5–5.0)
Alkaline Phosphatase: 60 U/L (ref 38–126)
BUN: 9 mg/dL (ref 6–20)
CHLORIDE: 107 mmol/L (ref 101–111)
CO2: 27 mmol/L (ref 22–32)
Calcium: 9.1 mg/dL (ref 8.9–10.3)
Creatinine, Ser: 0.62 mg/dL (ref 0.44–1.00)
GFR calc Af Amer: >60 mL/min (ref >60)
GFR calc non Af Amer: >60 mL/min (ref >60)
Glucose, Bld: 113 mg/dL — ABNORMAL HIGH (ref 65–99)
Potassium: 4 mmol/L (ref 3.5–5.1)
SODIUM: 140 mmol/L (ref 135–145)
Total Bilirubin: 0.8 mg/dL (ref 0.3–1.2)
Total Protein: 6.6 g/dL (ref 6.5–8.1)

## 2016-04-27 LAB — CBC WITH DIFFERENTIAL/PLATELET
Basophils Absolute: 0.1 10*3/uL (ref 0.0–0.1)
Basophils Relative: 1 %
EOS ABS: 0.3 10*3/uL (ref 0.0–0.7)
EOS PCT: 4 %
HCT: 38.5 % (ref 36.0–46.0)
Hemoglobin: 12.6 g/dL (ref 12.0–15.0)
LYMPHS ABS: 2.3 10*3/uL (ref 0.7–4.0)
Lymphocytes Relative: 28 %
MCH: 33.3 pg (ref 26.0–34.0)
MCHC: 32.7 g/dL (ref 30.0–36.0)
MCV: 101.9 fL — ABNORMAL HIGH (ref 78.0–100.0)
MONO ABS: 0.8 10*3/uL (ref 0.1–1.0)
MONOS PCT: 10 %
Neutro Abs: 4.7 10*3/uL (ref 1.7–7.7)
Neutrophils Relative %: 57 %
PLATELETS: 407 10*3/uL — AB (ref 150–400)
RBC: 3.78 MIL/uL — ABNORMAL LOW (ref 3.87–5.11)
RDW: 12.8 % (ref 11.5–15.5)
WBC: 8.3 10*3/uL (ref 4.0–10.5)

## 2016-04-27 LAB — GLUCOSE, CAPILLARY: GLUCOSE-CAPILLARY: 118 mg/dL — AB (ref 65–99)

## 2016-04-27 LAB — HEPATITIS PANEL, ACUTE
HCV AB: 8 {s_co_ratio} — AB (ref 0.0–0.9)
HEP B S AG: NEGATIVE
Hep A IgM: NEGATIVE
Hep B C IgM: NEGATIVE

## 2016-04-27 LAB — MAGNESIUM: MAGNESIUM: 1.9 mg/dL (ref 1.7–2.4)

## 2016-04-27 LAB — PHOSPHORUS: PHOSPHORUS: 3.8 mg/dL (ref 2.5–4.6)

## 2016-04-27 MED ORDER — GABAPENTIN 300 MG PO CAPS
600.0000 mg | ORAL_CAPSULE | Freq: Every day | ORAL | Status: DC
  Administered 2016-04-27: 600 mg via ORAL
  Filled 2016-04-27: qty 2

## 2016-04-27 NOTE — Progress Notes (Signed)
PROGRESS NOTE    Kathryn Underwood  VEH:[id number] DOB: Jul 13, 1960 DOA: 04/25/2016 PCP: Howard Pouch, DO   Brief Narrative: Kathryn Underwood is a 56 y.o. female with medical history significant for but that limited to Heavy Alcohol Abuse and Depression with Anxiety presenting with over 1 week history of palpitations and dry heaves associated with tremulousness of her extremities without any chest pain, shortness of breath, diaphoresis, fever or chills, cough or sputum production, no dysuria, no abdominal pain but has been feeling a little bit nauseated without vomiting  Patient had been in her usual state of health until she sustained right foot fracture (Jones fracture of the zone 2 of the base of the fifth metatarsal) about a month ago for which reason she had been in a cast, and had has some difficulty going to shop to obtain alcohol her alcohol intake has been diminishing significantly over last few weeks.  She felt that she might be withdrawing and had taken some wine without any significant improvement in his symptoms. She therefore came to the ED to get some help since she was interested in getting alcohol rehabilitation. Patient showing some withdrawal symptoms still and being treated for Alcohol withdrawal and polysubstance abuse. Slowly improving and CIWA score was 6 this AM.   Assessment & Plan:   Active Problems:   Depression with anxiety   Alcohol dependence with unspecified alcohol-induced disorder (HCC)   Alcohol withdrawal (HCC)   Tachycardia   Transaminitis   Leukocytosis  #1 Acohol Withdrawal/Alcohol Dependence: -Currently still withdrawing and having nausea, abdominal pain and tremors; Developed some loose stools now -CIWA Protocol with IV/oral benzo prn -C/w IVF with D5 NS NS + KCl 20 mEQ at 125 mL/hr -C/w MVI,Thiamine, and Folate -Education officer, museum involved; Given resources/education regarding therapy at [employer] or [employer] as well as Comptroller, Adult nurse, and  MHA -C/w Supportive care with Promethazine for Nausea and Famotidine 20 mg po BID  #2 Polysubstance Abuse: -UDS Positive for Cocaine, THC -Abuse of alcohol, cocaine and marijuana -Counseling -HIV Negative -Outpatient rehabilitation  #3 Leukocytosis with Tachycardia: -Likely from Withdrawl -Mild -No clinical evidence of SIRS -Check Lactic Acid was 1.1 -TSH was 2.626  #4 Transaminitis, improving -Likely due to alcohol -Improving -Hepatitis Panel Negative for Hep A and B but Hepatitis C Ab elevated at 8.0  #5 Depression with Anxiety: -Continue home medication of Wellbutrin 200 mg po BID and Lexapro 20 mg po Daily -Recent discontinuation of lorazepam -Gabapentin helpful with history of alcohol abuse and changed to home dose of 600-900 mg qHS -Outpatient follow-up with her psychiatrist -Social Work to provide Resources  #6 Hyperglycemia -CBG's ranging from 111-118 and Glucose has been 104-113 -Likely from D5W NS -Continue to Monitor  DVT prophylaxis: Lovenox 40 mg sq q24h Code Status: FULL CODE Family Communication: No family present at bedside Disposition Plan: Home likely in AM  Consultants:   None   Procedures: None  Antimicrobials:  Anti-infectives    None     Subjective: Seen and examined and was not feeling well and started having loose stools. Slightly nauseous and tremors are improving.   Objective: Vitals:   04/27/16 0500 04/27/16 0800 04/27/16 1200 04/27/16 1400  BP: 126/65   120/68  Pulse: 87 84 97 88  Resp: 16   18  Temp: 98.2 F (36.8 C)   98.2 F (36.8 C)  TempSrc: Oral   Oral  SpO2: 97%   98%  Weight:      Height:  Intake/Output Summary (Last 24 hours) at 04/27/16 2010 Last data filed at 04/27/16 1430  Gross per 24 hour  Intake          2320.42 ml  Output             1900 ml  Net           420.42 ml   Filed Weights   04/25/16 1308 04/25/16 2011  Weight: 67.1 kg (148 lb) 67.1 kg (147 lb 14.9 oz)    Examination: Physical Exam:  Constitutional: NAD calm and comfortable in bed Eyes: Lids and conjunctivae normal, sclerae anicteric  ENMT: External Ears, Nose appear normal. Grossly normal hearing.   Neck: Appears normal, supple, no cervical masses, normal ROM, no appreciable thyromegaly, no JVD Respiratory: Diminished to auscultation bilaterally, no wheezing, rales, rhonchi or crackles. Normal respiratory effort and patient is not tachypenic. No accessory muscle use.  Cardiovascular: RRR, no murmurs / rubs / gallops. S1 and S2 auscultated. No extremity edema.  Abdomen: Soft, tender, non-distended. No masses palpated. No appreciable hepatosplenomegaly. Bowel sounds positive.  GU: Deferred. Musculoskeletal: No clubbing / cyanosis of digits/nails. Right Leg boot off.  Skin: No rashes, lesions, ulcers on limited skin evaluation. No induration; Warm and dry.  Neurologic: CN 2-12 grossly intact with no focal deficits. Sensation intact in all 4 Extremities. Had some mild tremors  Psychiatric: Normal judgment and insight. Alert and oriented x 3. Normal mood and appropriate affect.   Data Reviewed: I have personally reviewed following labs and imaging studies  CBC:  Recent Labs Lab 04/25/16 1459 04/25/16 2045 04/26/16 0840 04/27/16 0516  WBC 11.7* 9.6 9.2 8.3  NEUTROABS 8.5*  --  5.6 4.7  HGB 14.6 12.1 13.3 12.6  HCT 43.2 36.8 40.5 38.5  MCV 102.4* 103.7* 104.9* 101.9*  PLT 453* 420* 373 419*   Basic Metabolic Panel:  Recent Labs Lab 04/25/16 1459 04/25/16 2045 04/26/16 0205 04/27/16 0516  NA 137  --  140 140  K 4.5  --  3.7 4.0  CL 102  --  106 107  CO2 22  --  27 27  GLUCOSE 99  --  104* 113*  BUN 19  --  17 9  CREATININE 0.79 0.70 0.71 0.62  CALCIUM 9.5  --  8.8* 9.1  MG 2.1 2.0  --  1.9  PHOS  --  3.5  --  3.8   GFR: Estimated Creatinine Clearance: 71.4 mL/min (by C-G formula based on SCr of 0.62 mg/dL). Liver Function Tests:  Recent Labs Lab 04/25/16 1459  04/26/16 0205 04/27/16 0516  AST 95* 60* 42*  ALT 74* 53 42  ALKPHOS 80 69 60  BILITOT 0.6 0.8 0.8  PROT 7.6 6.2* 6.6  ALBUMIN 4.4 3.9 3.7   No results for input(s): LIPASE, AMYLASE in the last 168 hours. No results for input(s): AMMONIA in the last 168 hours. Coagulation Profile: No results for input(s): INR, PROTIME in the last 168 hours. Cardiac Enzymes:  Recent Labs Lab 04/25/16 2045 04/26/16 0205 04/26/16 0840  TROPONINI <0.03 0.03* <0.03   BNP (last 3 results) No results for input(s): PROBNP in the last 8760 hours. HbA1C: No results for input(s): HGBA1C in the last 72 hours. CBG:  Recent Labs Lab 04/26/16 0736 04/27/16 0800  GLUCAP 111* 118*   Lipid Profile: No results for input(s): CHOL, HDL, LDLCALC, TRIG, CHOLHDL, LDLDIRECT in the last 72 hours. Thyroid Function Tests:  Recent Labs  04/25/16 2045  TSH 2.626   Anemia Panel:  No results for input(s): VITAMINB12, FOLATE, FERRITIN, TIBC, IRON, RETICCTPCT in the last 72 hours. Sepsis Labs:  Recent Labs Lab 04/25/16 2045  LATICACIDVEN 1.1    No results found for this or any previous visit (from the past 240 hour(s)).   Radiology Studies: No results found.  Scheduled Meds: . buPROPion  200 mg Oral BID PC  . cholecalciferol  2,000 Units Oral Daily  . enoxaparin (LOVENOX) injection  40 mg Subcutaneous Q24H  . escitalopram  20 mg Oral Daily  . famotidine  20 mg Oral BID  . folic acid  1 mg Oral Daily  . gabapentin  600-900 mg Oral QHS  . multivitamin with minerals  1 tablet Oral Daily  . thiamine  100 mg Oral Daily  . vitamin B-12  1,000 mcg Oral Daily   Continuous Infusions: . dextrose 5 % and 0.9 % NaCl with KCl 20 mEq/L 125 mL/hr (04/27/16 0553)    LOS: 2 days   Kerney Elbe, DO [facility] Pager [phone]  If 7PM-7AM, please contact night-coverage [email or web address] Password [email or web address] 04/27/2016, 8:10 PM

## 2016-04-28 ENCOUNTER — Inpatient Hospital Stay (HOSPITAL_COMMUNITY): Payer: Self-pay

## 2016-04-28 ENCOUNTER — Telehealth: Payer: Self-pay | Admitting: Family Medicine

## 2016-04-28 DIAGNOSIS — F332 Major depressive disorder, recurrent severe without psychotic features: Secondary | ICD-10-CM | POA: Diagnosis present

## 2016-04-28 LAB — COMPREHENSIVE METABOLIC PANEL
ALBUMIN: 3.8 g/dL (ref 3.5–5.0)
ALK PHOS: 58 U/L (ref 38–126)
ALT: 38 U/L (ref 14–54)
ANION GAP: 5 (ref 5–15)
AST: 34 U/L (ref 15–41)
BILIRUBIN TOTAL: 0.6 mg/dL (ref 0.3–1.2)
BUN: 10 mg/dL (ref 6–20)
CALCIUM: 9.1 mg/dL (ref 8.9–10.3)
CO2: 28 mmol/L (ref 22–32)
CREATININE: 0.66 mg/dL (ref 0.44–1.00)
Chloride: 108 mmol/L (ref 101–111)
GFR calc Af Amer: >60 mL/min (ref >60)
GFR calc non Af Amer: >60 mL/min (ref >60)
GLUCOSE: 111 mg/dL — AB (ref 65–99)
Potassium: 3.9 mmol/L (ref 3.5–5.1)
Sodium: 141 mmol/L (ref 135–145)
TOTAL PROTEIN: 6.2 g/dL — AB (ref 6.5–8.1)

## 2016-04-28 LAB — URINE CULTURE: CULTURE: NO GROWTH

## 2016-04-28 LAB — CBC WITH DIFFERENTIAL/PLATELET
BASOS PCT: 1 %
Basophils Absolute: 0.1 10*3/uL (ref 0.0–0.1)
Eosinophils Absolute: 0.3 10*3/uL (ref 0.0–0.7)
Eosinophils Relative: 4 %
HEMATOCRIT: 37.9 % (ref 36.0–46.0)
HEMOGLOBIN: 12.6 g/dL (ref 12.0–15.0)
Lymphocytes Relative: 32 %
Lymphs Abs: 2.5 10*3/uL (ref 0.7–4.0)
MCH: 34.5 pg — ABNORMAL HIGH (ref 26.0–34.0)
MCHC: 33.2 g/dL (ref 30.0–36.0)
MCV: 103.8 fL — ABNORMAL HIGH (ref 78.0–100.0)
MONOS PCT: 8 %
Monocytes Absolute: 0.6 10*3/uL (ref 0.1–1.0)
Neutro Abs: 4.5 10*3/uL (ref 1.7–7.7)
Neutrophils Relative %: 57 %
Platelets: 372 10*3/uL (ref 150–400)
RBC: 3.65 MIL/uL — ABNORMAL LOW (ref 3.87–5.11)
RDW: 12.7 % (ref 11.5–15.5)
WBC: 8 10*3/uL (ref 4.0–10.5)

## 2016-04-28 LAB — MAGNESIUM: Magnesium: 1.9 mg/dL (ref 1.7–2.4)

## 2016-04-28 LAB — GLUCOSE, CAPILLARY: Glucose-Capillary: 104 mg/dL — ABNORMAL HIGH (ref 65–99)

## 2016-04-28 LAB — PHOSPHORUS: Phosphorus: 3.9 mg/dL (ref 2.5–4.6)

## 2016-04-28 MED ORDER — THIAMINE HCL 100 MG PO TABS: 100.0000 mg | ORAL_TABLET | Freq: Every day | ORAL | 0 refills | Status: DC

## 2016-04-28 MED ORDER — FOLIC ACID 1 MG PO TABS: 1.0000 mg | ORAL_TABLET | Freq: Every day | ORAL | 0 refills | Status: DC

## 2016-04-28 NOTE — Consult Note (Signed)
[place] [facility] Consult   Reason for Consult:  ETOH abuse Referring Physician:  [place] Patient Identification: Kathryn Underwood MRN:  [id number] Principal Diagnosis: Alcohol dependence with unspecified alcohol-induced disorder [facility]) Diagnosis:   Patient Active Problem List   Diagnosis Date Noted  . MDD (major depressive disorder), recurrent severe, without psychosis ([place]) [F33.2] 04/28/2016    Priority: High  . Alcohol dependence with unspecified alcohol-induced disorder ([place]) [F10.29] 12/09/2015    Priority: High  . Alcohol withdrawal ([place]) [F10.239] 04/25/2016  . Tachycardia [R00.0] 04/25/2016  . Transaminitis [R74.0] 04/25/2016  . Leukocytosis [D72.829] 04/25/2016  . Closed nondisplaced fracture of fifth right metatarsal bone [S92.354A] 04/08/2016  . Acute pain of right shoulder [M25.511] 04/08/2016  . Vitamin D deficiency [E55.9] 12/11/2015  . Fatigue [R53.83] 12/09/2015  . Encounter for alcohol abuse counseling and surveillance [Z71.41] 12/09/2015  . Tobacco abuse [Z72.0] 01/04/2015  . Depression with anxiety [F41.8] 01/04/2015    Total Time spent with patient: 20 minutes  Subjective:   Kathryn Underwood is a 56 y.o. female patient admitted with reports of ETOH abuse and dependence with concomitant depression/anxiety and intermittent panic attacks. Pt seen and chart reviewed. Pt is alert/oriented x4, calm, cooperative, and appropriate to situation. Pt denies suicidal/homicidal ideation and psychosis and does not appear to be responding to internal stimuli. Pt reports that she is aware of many inpatient rehab options but needs to take care of her boyfriend of 29 years and would prefer outpatient psychiatry and AA meetings. Pt reports that she sees "Dr. Erling Cruz" for her psychiatrist and goes to [facility] when he is not available. Pt has good insight into her condition, demonstrates no subjective/objective signs of ETOH withdrawal at this time, and does not appear to pose a danger  to herself or others.  HPI:  I have reviewed and concur with HPI elements below, modified as follows:  "Kathryn Underwood is a 56 y.o. female with medical history significant for but that limited to Heavy Alcohol Abuse and Depression with Anxiety presenting with over 1 week history of palpitations and dry heaves associated with tremulousness of her extremities without any chest pain, shortness of breath, diaphoresis, fever or chills, cough or sputum production, no dysuria, no abdominal pain but has been feeling a little bit nauseated without vomiting  Patient had been in her usual state of health until she sustained right foot fracture (Jones fracture of the zone 2 of the base of the fifth metatarsal) about a month ago for which reason she had been in a cast, and had has some difficulty going to shop to obtain alcohol.her alcohol intake has been diminishing significantly over last few weeks."  Pt seen and chart reviewed today as above on 04/28/16. Staff report that pt has been calm, cooperative, and appropriate on the unit with no reported signs of objective/subjective risk for self-harm or withdrawal.   Past Psychiatric History: GAD, MDD, ETOH abuse  Risk to Self: Is patient at risk for suicide?: No Risk to Others:   Prior Inpatient Therapy:   Prior Outpatient Therapy:    Past Medical History:  Past Medical History:  Diagnosis Date  . Anxiety   . Chickenpox   . Depression   . Neuromuscular disorder ([place])    neuropathy    Past Surgical History:  Procedure Laterality Date  . CESAREAN SECTION    . TONSILLECTOMY     Family History:  Family History  Problem Relation Age of Onset  . Dementia Mother   . Arthritis  Father   . Asthma Father   . Asthma Brother   . Diabetes Maternal Grandmother   . Dementia Maternal Grandmother   . Diabetes Paternal Grandmother   . Parkinson's disease Maternal Grandfather   . Parkinson's disease Paternal Grandfather   . Breast cancer Neg Hx   . Colon  cancer Neg Hx    Family Psychiatric  History: denies Social History:  History  Alcohol Use  . 4.8 oz/week  . 8 Glasses of wine per week    Comment: daily     History  Drug Use No    Social History   Social History  . Marital status: Divorced    Spouse name: N/A  . Number of children: N/A  . Years of education: N/A   Social History Main Topics  . Smoking status: Current Every Day Smoker    Packs/day: 0.50    Types: Cigarettes  . Smokeless tobacco: Never Used  . Alcohol use 4.8 oz/week    8 Glasses of wine per week     Comment: daily  . Drug use: No  . Sexual activity: Yes    Partners: Male     Comment: female partner   Other Topics Concern  . None   Social History Narrative   Divorced. Homemaker. HS grad.    Lives with life partner (stan)   Drinks caffeine.   Wears seatbelt. Smoke dectector in the home. No firearms in the home.    Feels safe in her relationships.     Additional Social History:    Allergies:  No Known Allergies  Labs:  Results for orders placed or performed during the hospital encounter of 04/25/16 (from the past 48 hour(s))  Urine culture     Status: None   Collection Time: 04/26/16  8:22 PM  Result Value Ref Range   Specimen Description URINE, CLEAN CATCH    Special Requests NONE    Culture      NO GROWTH Performed at [facility], [place] [address], [place], [place] [address]    Report Status 04/28/2016 FINAL   Urinalysis, Routine w reflex microscopic     Status: Abnormal   Collection Time: 04/26/16  8:22 PM  Result Value Ref Range   Color, Urine STRAW (A) YELLOW   APPearance CLEAR CLEAR   Specific Gravity, Urine 1.004 (L) 1.005 - 1.030   pH 5.0 5.0 - 8.0   Glucose, UA NEGATIVE NEGATIVE mg/dL   Hgb urine dipstick SMALL (A) NEGATIVE   Bilirubin Urine NEGATIVE NEGATIVE   Ketones, ur NEGATIVE NEGATIVE mg/dL   Protein, ur NEGATIVE NEGATIVE mg/dL   Nitrite NEGATIVE NEGATIVE   Leukocytes, UA NEGATIVE NEGATIVE   RBC / HPF 0-5 0 -  5 RBC/hpf   WBC, UA NONE SEEN 0 - 5 WBC/hpf   Bacteria, UA NONE SEEN NONE SEEN   Squamous Epithelial / LPF NONE SEEN NONE SEEN  CBC with Differential/Platelet     Status: Abnormal   Collection Time: 04/27/16  5:16 AM  Result Value Ref Range   WBC 8.3 4.0 - 10.5 K/uL   RBC 3.78 (L) 3.87 - 5.11 MIL/uL   Hemoglobin 12.6 12.0 - 15.0 g/dL   HCT 38.5 36.0 - 46.0 %   MCV 101.9 (H) 78.0 - 100.0 fL   MCH 33.3 26.0 - 34.0 pg   MCHC 32.7 30.0 - 36.0 g/dL   RDW 12.8 11.5 - 15.5 %   Platelets 407 (H) 150 - 400 K/uL   Neutrophils Relative %  57 %   Neutro Abs 4.7 1.7 - 7.7 K/uL   Lymphocytes Relative 28 %   Lymphs Abs 2.3 0.7 - 4.0 K/uL   Monocytes Relative 10 %   Monocytes Absolute 0.8 0.1 - 1.0 K/uL   Eosinophils Relative 4 %   Eosinophils Absolute 0.3 0.0 - 0.7 K/uL   Basophils Relative 1 %   Basophils Absolute 0.1 0.0 - 0.1 K/uL  Comprehensive metabolic panel     Status: Abnormal   Collection Time: 04/27/16  5:16 AM  Result Value Ref Range   Sodium 140 135 - 145 mmol/L   Potassium 4.0 3.5 - 5.1 mmol/L   Chloride 107 101 - 111 mmol/L   CO2 27 22 - 32 mmol/L   Glucose, Bld 113 (H) 65 - 99 mg/dL   BUN 9 6 - 20 mg/dL   Creatinine, Ser 0.62 0.44 - 1.00 mg/dL   Calcium 9.1 8.9 - 10.3 mg/dL   Total Protein 6.6 6.5 - 8.1 g/dL   Albumin 3.7 3.5 - 5.0 g/dL   AST 42 (H) 15 - 41 U/L   ALT 42 14 - 54 U/L   Alkaline Phosphatase 60 38 - 126 U/L   Total Bilirubin 0.8 0.3 - 1.2 mg/dL   GFR calc non Af Amer >60 >60 mL/min   GFR calc Af Amer >60 >60 mL/min    Comment: (NOTE) The eGFR has been calculated using the CKD EPI equation. This calculation has not been validated in all clinical situations. eGFR's persistently <60 mL/min signify possible Chronic Kidney Disease.    Anion gap 6 5 - 15  Magnesium     Status: None   Collection Time: 04/27/16  5:16 AM  Result Value Ref Range   Magnesium 1.9 1.7 - 2.4 mg/dL  Phosphorus     Status: None   Collection Time: 04/27/16  5:16 AM  Result Value  Ref Range   Phosphorus 3.8 2.5 - 4.6 mg/dL  Glucose, capillary     Status: Abnormal   Collection Time: 04/27/16  8:00 AM  Result Value Ref Range   Glucose-Capillary 118 (H) 65 - 99 mg/dL  CBC with Differential/Platelet     Status: Abnormal   Collection Time: 04/28/16  5:07 AM  Result Value Ref Range   WBC 8.0 4.0 - 10.5 K/uL   RBC 3.65 (L) 3.87 - 5.11 MIL/uL   Hemoglobin 12.6 12.0 - 15.0 g/dL   HCT 37.9 36.0 - 46.0 %   MCV 103.8 (H) 78.0 - 100.0 fL   MCH 34.5 (H) 26.0 - 34.0 pg   MCHC 33.2 30.0 - 36.0 g/dL   RDW 12.7 11.5 - 15.5 %   Platelets 372 150 - 400 K/uL   Neutrophils Relative % 57 %   Neutro Abs 4.5 1.7 - 7.7 K/uL   Lymphocytes Relative 32 %   Lymphs Abs 2.5 0.7 - 4.0 K/uL   Monocytes Relative 8 %   Monocytes Absolute 0.6 0.1 - 1.0 K/uL   Eosinophils Relative 4 %   Eosinophils Absolute 0.3 0.0 - 0.7 K/uL   Basophils Relative 1 %   Basophils Absolute 0.1 0.0 - 0.1 K/uL  Comprehensive metabolic panel     Status: Abnormal   Collection Time: 04/28/16  5:07 AM  Result Value Ref Range   Sodium 141 135 - 145 mmol/L   Potassium 3.9 3.5 - 5.1 mmol/L   Chloride 108 101 - 111 mmol/L   CO2 28 22 - 32 mmol/L   Glucose, Bld 111 (H)  65 - 99 mg/dL   BUN 10 6 - 20 mg/dL   Creatinine, Ser 0.66 0.44 - 1.00 mg/dL   Calcium 9.1 8.9 - 10.3 mg/dL   Total Protein 6.2 (L) 6.5 - 8.1 g/dL   Albumin 3.8 3.5 - 5.0 g/dL   AST 34 15 - 41 U/L   ALT 38 14 - 54 U/L   Alkaline Phosphatase 58 38 - 126 U/L   Total Bilirubin 0.6 0.3 - 1.2 mg/dL   GFR calc non Af Amer >60 >60 mL/min   GFR calc Af Amer >60 >60 mL/min    Comment: (NOTE) The eGFR has been calculated using the CKD EPI equation. This calculation has not been validated in all clinical situations. eGFR's persistently <60 mL/min signify possible Chronic Kidney Disease.    Anion gap 5 5 - 15  Magnesium     Status: None   Collection Time: 04/28/16  5:07 AM  Result Value Ref Range   Magnesium 1.9 1.7 - 2.4 mg/dL  Phosphorus      Status: None   Collection Time: 04/28/16  5:07 AM  Result Value Ref Range   Phosphorus 3.9 2.5 - 4.6 mg/dL  Glucose, capillary     Status: Abnormal   Collection Time: 04/28/16  7:38 AM  Result Value Ref Range   Glucose-Capillary 104 (H) 65 - 99 mg/dL    Current Facility-Administered Medications  Medication Dose Route Frequency Provider Last Rate Last Dose  . acetaminophen (TYLENOL) tablet 650 mg  650 mg Oral Q6H PRN Benito Mccreedy, MD   650 mg at 04/26/16 1658   Or  . acetaminophen (TYLENOL) suppository 650 mg  650 mg Rectal Q6H PRN Benito Mccreedy, MD      . buPROPion [facility] SR) 12 hr tablet 200 mg  200 mg Oral BID PC Benito Mccreedy, MD   200 mg at 04/28/16 0932  . cholecalciferol (VITAMIN D) tablet 2,000 Units  2,000 Units Oral Daily Benito Mccreedy, MD   2,000 Units at 04/28/16 0932  . dextrose 5 % and 0.9 % NaCl with KCl 20 mEq/L infusion   Intravenous Continuous Benito Mccreedy, MD 125 mL/hr at 04/28/16 0620    . enoxaparin (LOVENOX) injection 40 mg  40 mg Subcutaneous Q24H Benito Mccreedy, MD   40 mg at 04/27/16 2149  . escitalopram (LEXAPRO) tablet 20 mg  20 mg Oral Daily Benito Mccreedy, MD   20 mg at 04/28/16 0932  . famotidine (PEPCID) tablet 20 mg  20 mg Oral BID Benito Mccreedy, MD   20 mg at 04/28/16 0933  . folic acid (FOLVITE) tablet 1 mg  1 mg Oral Daily Benito Mccreedy, MD   1 mg at 04/28/16 0932  . gabapentin (NEURONTIN) capsule 600-900 mg  600-900 mg Oral QHS Bertram Savin Sheikh, DO   600 mg at 04/27/16 2149  . ibuprofen (ADVIL,MOTRIN) tablet 800 mg  800 mg Oral Q6H PRN Benito Mccreedy, MD   800 mg at 04/28/16 0932  . LORazepam (ATIVAN) injection 2-3 mg  2-3 mg Intravenous Q1H PRN Orlie Dakin, MD   2 mg at 04/27/16 2149  . multivitamin with minerals tablet 1 tablet  1 tablet Oral Daily Benito Mccreedy, MD   1 tablet at 04/28/16 0933  . promethazine (PHENERGAN) tablet 12.5 mg  12.5 mg Oral Q6H PRN Benito Mccreedy, MD      . thiamine  (VITAMIN B-1) tablet 100 mg  100 mg Oral Daily Benito Mccreedy, MD   100 mg at 04/28/16 0932  . vitamin  B-12 (CYANOCOBALAMIN) tablet 1,000 mcg  1,000 mcg Oral Daily Benito Mccreedy, MD   1,000 mcg at 04/28/16 9574    Musculoskeletal: Strength & Muscle Tone: decreased Gait & Station: in bed Patient leans: Backward  Psychiatric Specialty Exam: Physical Exam  Review of Systems  Psychiatric/Behavioral: Positive for depression and substance abuse. Negative for hallucinations and suicidal ideas. The patient is nervous/anxious and has insomnia.   All other systems reviewed and are negative.   Blood pressure 126/80, pulse 95, temperature 98.4 F (36.9 C), temperature source Oral, resp. rate 18, height '5\' 3"'  (1.6 m), weight 67.1 kg (147 lb 14.9 oz), SpO2 97 %.Body mass index is 26.2 kg/m.  General Appearance: Casual and Fairly Groomed  Eye Contact:  Good  Speech:  Clear and Coherent and Normal Rate  Volume:  Normal  Mood:  Anxious  Affect:  Appropriate and Congruent  Thought Process:  Coherent, Goal Directed, Linear and Descriptions of Associations: Intact  Orientation:  Full (Time, Place, and Person)  Thought Content:  Focused on discharge and caring for her family  Suicidal Thoughts:  No  Homicidal Thoughts:  No  Memory:  Immediate;   Fair Recent;   Fair Remote;   Fair  Judgement:  Fair  Insight:  Fair  Psychomotor Activity:  Normal  Concentration:  Concentration: Fair and Attention Span: Fair  Recall:  [employer] of Knowledge:  Fair  Language:  Fair  Akathisia:  No  Handed:    AIMS (if indicated):     Assets:  Communication Skills Desire for Improvement Resilience Social Support Talents/Skills  ADL's:  Intact  Cognition:  WNL  Sleep:      Treatment Plan Summary: Alcohol dependence with unspecified alcohol-induced disorder ([place]) stable for outpatient management.    Disposition: No evidence of imminent risk to self or others at present.   Patient does not meet  criteria for psychiatric inpatient admission. Supportive therapy provided about ongoing stressors. Discussed crisis plan, support from social network, calling 911, coming to the Emergency Department, and calling Suicide Hotline.  Benjamine Mola,  04/28/2016 12:02 PM

## 2016-04-28 NOTE — Telephone Encounter (Signed)
**  Remind patient they can make refill requests via MyChart**  Medication refill request (Name & Dosage):  Lorazepam, 1mg   Preferred pharmacy (Name & Address):  [id number] - [place], [place] [phone] (Phone) [phone] (Fax)      Other comments (if applicable):

## 2016-04-28 NOTE — Discharge Summary (Signed)
Physician Discharge Summary  Kathryn Underwood:[id number] DOB: [birth year] DOA: 04/25/2016  PCP: Howard Pouch, DO  Admit date: 04/25/2016 Discharge date: 04/28/2016  Admitted From: Home Disposition:  Home  Recommendations for Outpatient Follow-up:  1. Follow up with PCP in 1-2 weeks Has appointment April 11th 2. Follow up with [facility] or [facility] as well as [facility], [facility] and [facility] 3. Patient has Outpatient Psychiatrist in [place] 4. Please obtain CMP/CBC in one week 5. Please follow up on the following pending results: HIV RNA QUANT as HEP C AB was 8.0  Home Health: No Equipment/Devices: None  Discharge Condition: Stable CODE STATUS: FULL CODE Diet recommendation: Heart Healthy / Carb Modified / Regular / Dysphagia   Brief/Interim Summary: Kathryn Wattley Johnsonis a 55 y.o.femalewith medical history significant for but that limited to Heavy Alcohol Abuse and Depression with Anxiety presenting with over 1 week history of palpitations and dry heaves associated with tremulousness of her extremities without any chest pain, shortness of breath, diaphoresis, fever or chills, cough or sputum production, no dysuria, no abdominal pain but has been feeling a little bit nauseated without vomiting  Patient had been in her usual state of health until she sustained right foot fracture (Jones fracture of the zone 2 of the base of the fifth metatarsal) about a month ago for which reason she had been in a cast, and had has some difficulty going to shop to obtain alcohol her alcohol intake has been diminishing significantly over last few weeks.  She felt that she might be withdrawing and had taken some wine without any significant improvement in his symptoms. She therefore came to the ED to get some help since she was interested in getting alcohol rehabilitation. Patient showing some withdrawal symptoms still and was treated for Alcohol withdrawal and polysubstance abuse. She slowly improved and  Clinicial Education officer, museum met with patient to provide resources for outpatient Rehabilitation but she refused. Patient appeared depressed so Psych evaluated who did not change any of her meds and told her to follow up with PCP and her Outpatient Psychiatrist at discharge. Patient was deemed medically stable and appreciated Psych's Input as patient appeared to be out of active withdrawls.  Discharge Diagnoses:  Principal Problem:   Alcohol dependence with unspecified alcohol-induced disorder ([place]) Active Problems:   Depression with anxiety   Alcohol withdrawal (HCC)   Tachycardia   Transaminitis   Leukocytosis   MDD (major depressive disorder), recurrent severe, without psychosis ([place])  1 Acohol Withdrawal/Alcohol Dependence: -Improved -CIWA Protocol with IV/oral benzo prn while in the hospital -D/c'd IVF with D5 NS NS + KCl 20 mEQ at 125 mL/hr -C/w MVI,Thiamine, and Folate at D/C -Education officer, museum involved; Given resources/education regarding therapy at [employer] or [facility] as well as Comptroller, Adult nurse, and [facility] but patient is refusing -Appreciate Psych Eval and input and recommend that she can follow up as an outpatient   #2 Polysubstance Abuse: -UDS Positive for Cocaine, THC -Abuse of alcohol, cocaine and marijuana -Counseling -HIV Negative -Outpatient rehabilitation per Psych's recommendations  #3 Leukocytosis with Tachycardia: -Likely from Withdrawal; IMPROVED -Mild -No clinical evidence of SIRS -Check Lactic Acid was 1.1 -TSH was 2.626  #4 Transaminitis, improved -Likely due to alcohol -Improving -Hepatitis Panel Negative for Hep A and B but Hepatitis C Ab elevated at 8.0; Ordered HIV RNA QUANT AND WILL NEED TO BE FOLLOWED ON -HIV Non-Reactive  #5 Depression with Anxiety: -Continue home medication of Wellbutrin 200 mg po BID and Lexapro 20 mg  po Daily -Recent discontinuation of lorazepam and follow up with outpatient Psychiatry -Gabapentin helpful with history of  alcohol abuse and changed to home dose of 600-900 mg qHS -Outpatient follow-up with her psychiatrist -Social Work to provide Resources  #6 Hyperglycemia -CBG's ranging from 104-118 -Likely from D5W NS -Follow up as an outpatient  Discharge Instructions  Discharge Instructions    Call MD for:  difficulty breathing, headache or visual disturbances    Complete by:  As directed    Call MD for:  extreme fatigue    Complete by:  As directed    Call MD for:  persistant dizziness or light-headedness    Complete by:  As directed    Call MD for:  persistant nausea and vomiting    Complete by:  As directed    Call MD for:  severe uncontrolled pain    Complete by:  As directed    Call MD for:  temperature >100.4    Complete by:  As directed    Diet - low sodium heart healthy    Complete by:  As directed    Discharge instructions    Complete by:  As directed    Follow up with PCP and Psychiatry in [facility]. Avoid alcohol and take all medications as prescribed. If symptoms change or worsen please return to the ED for evaluation.   Increase activity slowly    Complete by:  As directed      Allergies as of 04/28/2016   No Known Allergies     Medication List    STOP taking these medications   ibuprofen 800 MG tablet Commonly known as:  ADVIL,MOTRIN     TAKE these medications   buPROPion 200 MG 12 hr tablet Commonly known as:  WELLBUTRIN SR Take 1 tablet by mouth 2 (two) times daily after a meal.   cholecalciferol 1000 units tablet Commonly known as:  VITAMIN D Take 2,000 Units by mouth daily.   cyanocobalamin 1000 MCG tablet Take 1,000 mcg by mouth daily.   escitalopram 20 MG tablet Commonly known as:  LEXAPRO Take 1 tablet (20 mg total) by mouth daily.   famotidine 20 MG tablet Commonly known as:  PEPCID Take 1 tablet (20 mg total) by mouth 2 (two) times daily. Take with NSAID   folic acid 1 MG tablet Commonly known as:  FOLVITE Take 1 tablet (1 mg total) by mouth  daily. Start taking on:  04/29/2016   gabapentin 300 MG capsule Commonly known as:  NEURONTIN Take 600-900 capsules by mouth daily. Take 600 mg for regular anxiety and 900 mg for severe anxiety   multivitamin capsule Take 1 capsule by mouth daily.   thiamine 100 MG tablet Take 1 tablet (100 mg total) by mouth daily. Start taking on:  04/29/2016       No Known Allergies  Consultations:  Psychiatry Catalina Pizza, FNP  Procedures/Studies: Dg Shoulder Right  Result Date: 04/28/2016 CLINICAL DATA:  Worsening right shoulder pain. Dog walking injury February 2018. EXAM: RIGHT SHOULDER - 2+ VIEW COMPARISON:  03/26/2016 FINDINGS: Humeral head is normally located. No glenohumeral degenerative change seen. Normal humeral acromial distance. AC joint is normal. Regional ribs are normal. IMPRESSION: Normal radiographs. Electronically Signed   By: Nelson Chimes M.D.   On: 04/28/2016 10:26   Dg Foot Complete Right  Result Date: 04/23/2016 CLINICAL DATA:  Follow-up fracture. EXAM: RIGHT FOOT COMPLETE - 3+ VIEW COMPARISON:  04/08/2016. FINDINGS: Nondisplaced fracture of the base of the right  fifth metatarsal is noted. No interim change in appearance. No prominent callus formation. Diffuse osteopenia and degenerative change. Osteopenia may have increased slightly from prior exam. This may be from disuse. IMPRESSION: 1. Unchanged fracture of base of the right fifth metatarsal. No significant callus formation. 2. Diffuse osteopenia and degenerative change. Osteopenia may have increased slightly from prior exam. This could be from disuse. Electronically Signed   By: Marcello Moores  Register   On: 04/23/2016 07:08   Dg Foot Complete Right  Result Date: 04/08/2016 CLINICAL DATA:  Status post fall 2 weeks ago, recheck right fifth metatarsal fracture. EXAM: RIGHT FOOT COMPLETE - 3+ VIEW COMPARISON:  Right foot series of March 26, 2016 FINDINGS: Again demonstrated is the transversely oriented comminuted fracture of the  base of the fifth metatarsal. Alignment remains near anatomic. No periosteal reaction is observed. The fracture lines are well demonstrated. No other metatarsal fractures are observed. The phalanges and tarsal bones are intact. The joint spaces are reasonably well-maintained. There remains a small amount of soft tissue swelling over the base of the fifth metatarsal. IMPRESSION: No evidence of bony bridging of the fracture of the base of the fifth metatarsal. Alignment remains near anatomic. Electronically Signed   By: David  Martinique M.D.   On: 04/08/2016 10:35     Subjective: Was feeling better withdrawal symptoms but was not doing good emotionally and was tearful. [facility] who recommended outpatient adjustment of her medications. Patient medically clear to go home as she appeared to be out of withdrawls.   Discharge Exam: Vitals:   04/28/16 0800 04/28/16 1413  BP:  140/88  Pulse: 95 95  Resp:  18  Temp:  98.2 F (36.8 C)   Vitals:   04/27/16 2111 04/28/16 0551 04/28/16 0800 04/28/16 1413  BP: (!) 153/91 126/80  140/88  Pulse: 91 89 95 95  Resp: _0 Temp: 98.6 F (37 C) 98.4 F (36.9 C)  98.2 F (36.8 C)  TempSrc: Oral Oral  Oral  SpO2: 98% 97%  99%  Weight:      Height:       General: Pt is alert, awake, not in acute distress Cardiovascular: RRR, S1/S2 +, no rubs, no gallops Respiratory: CTA bilaterally, no wheezing, no rhonchi Abdominal: Soft, NT, ND, bowel sounds + Extremities: no edema, no cyanosis  The results of significant diagnostics from this hospitalization (including imaging, microbiology, ancillary and laboratory) are listed below for reference.    Microbiology: Recent Results (from the past 240 hour(s))  Urine culture     Status: None   Collection Time: 04/26/16  8:22 PM  Result Value Ref Range Status   Specimen Description URINE, CLEAN CATCH  Final   Special Requests NONE  Final   Culture   Final    NO GROWTH Performed at [facility], [address], [place], [place] [address]    Report Status 04/28/2016 FINAL  Final    Labs: BNP (last 3 results)  Recent Labs  04/25/16 2045  BNP 94.7   Basic Metabolic Panel:  Recent Labs Lab 04/25/16 1459 04/25/16 2045 04/26/16 0205 04/27/16 0516 04/28/16 0507  NA 137  --  140 140 141  K 4.5  --  3.7 4.0 3.9  CL 102  --  106 107 108  CO2 22  --  _1 GLUCOSE 99  --  104* 113* 111*  BUN 19  --  _2 CREATININE 0.79 0.70 0.71 0.62  0.66  CALCIUM 9.5  --  8.8* 9.1 9.1  MG 2.1 2.0  --  1.9 1.9  PHOS  --  3.5  --  3.8 3.9   Liver Function Tests:  Recent Labs Lab 04/25/16 1459 04/26/16 0205 04/27/16 0516 04/28/16 0507  AST 95* 60* 42* 34  ALT 74* 53 42 38  ALKPHOS 80 69 60 58  BILITOT 0.6 0.8 0.8 0.6  PROT 7.6 6.2* 6.6 6.2*  ALBUMIN 4.4 3.9 3.7 3.8   No results for input(s): LIPASE, AMYLASE in the last 168 hours. No results for input(s): AMMONIA in the last 168 hours. CBC:  Recent Labs Lab 04/25/16 1459 04/25/16 2045 04/26/16 0840 04/27/16 0516 04/28/16 0507  WBC 11.7* 9.6 9.2 8.3 8.0  NEUTROABS 8.5*  --  5.6 4.7 4.5  HGB 14.6 12.1 13.3 12.6 12.6  HCT 43.2 36.8 40.5 38.5 37.9  MCV 102.4* 103.7* 104.9* 101.9* 103.8*  PLT 453* 420* 373 407* 372   Cardiac Enzymes:  Recent Labs Lab 04/25/16 2045 04/26/16 0205 04/26/16 0840  TROPONINI <0.03 0.03* <0.03   BNP: Invalid input(s): POCBNP CBG:  Recent Labs Lab 04/26/16 0736 04/27/16 0800 04/28/16 0738  GLUCAP 111* 118* 104*   D-Dimer No results for input(s): DDIMER in the last 72 hours. Hgb A1c No results for input(s): HGBA1C in the last 72 hours. Lipid Profile No results for input(s): CHOL, HDL, LDLCALC, TRIG, CHOLHDL, LDLDIRECT in the last 72 hours. Thyroid function studies  Recent Labs  04/25/16 2045  TSH 2.626   Anemia work up No results for input(s): VITAMINB12, FOLATE, FERRITIN, TIBC, IRON, RETICCTPCT in the last 72 hours. Urinalysis    Component Value  Date/Time   COLORURINE STRAW (A) 04/26/2016 2022   APPEARANCEUR CLEAR 04/26/2016 2022   LABSPEC 1.004 (L) 04/26/2016 2022   PHURINE 5.0 04/26/2016 2022   GLUCOSEU NEGATIVE 04/26/2016 2022   HGBUR SMALL (A) 04/26/2016 2022   BILIRUBINUR NEGATIVE 04/26/2016 2022   KETONESUR NEGATIVE 04/26/2016 2022   PROTEINUR NEGATIVE 04/26/2016 2022   UROBILINOGEN 0.2 01/23/2010 1750   NITRITE NEGATIVE 04/26/2016 2022   LEUKOCYTESUR NEGATIVE 04/26/2016 2022   Sepsis Labs Invalid input(s): PROCALCITONIN,  WBC,  LACTICIDVEN Microbiology Recent Results (from the past 240 hour(s))  Urine culture     Status: None   Collection Time: 04/26/16  8:22 PM  Result Value Ref Range Status   Specimen Description URINE, CLEAN CATCH  Final   Special Requests NONE  Final   Culture   Final    NO GROWTH Performed at [facility], [place] [address], [place], [place] [address]    Report Status 04/28/2016 FINAL  Final   Time coordinating discharge: 40 minutes  SIGNED:  Kerney Elbe, DO [facility] 04/28/2016, 4:26 PM Pager [phone]  If 7PM-7AM, please contact night-coverage [email or web address] Password [email or web address]

## 2016-04-28 NOTE — Telephone Encounter (Signed)
Ativan Rx declined notified patient pharmacy. Patient is admitted to hospital at this time.

## 2016-04-29 ENCOUNTER — Telehealth: Payer: Self-pay

## 2016-04-29 LAB — HCV RNA QUANT RFLX ULTRA OR GENOTYP
HCV RNA QNT(LOG COPY/ML): UNDETERMINED {Log_IU}/mL
HEPATITIS C QUANTITATION: NOT DETECTED [IU]/mL

## 2016-04-29 NOTE — Telephone Encounter (Signed)
LM requesting call back to complete TCM and schedule hospital f/u.  

## 2016-04-30 NOTE — Telephone Encounter (Signed)
Attempted to contact patient to complete TCM and schedule hospital f/u. VM box full, unable to leave message.

## 2016-05-04 ENCOUNTER — Other Ambulatory Visit: Payer: Self-pay

## 2016-05-04 DIAGNOSIS — F418 Other specified anxiety disorders: Secondary | ICD-10-CM

## 2016-05-04 MED ORDER — ESCITALOPRAM OXALATE 20 MG PO TABS
20.0000 mg | ORAL_TABLET | Freq: Every day | ORAL | 0 refills | Status: DC
Start: 2016-05-04 — End: 2016-11-12

## 2016-05-06 ENCOUNTER — Encounter: Payer: Self-pay | Admitting: Sports Medicine

## 2016-05-06 ENCOUNTER — Ambulatory Visit (INDEPENDENT_AMBULATORY_CARE_PROVIDER_SITE_OTHER): Payer: Self-pay | Admitting: Sports Medicine

## 2016-05-06 ENCOUNTER — Ambulatory Visit (INDEPENDENT_AMBULATORY_CARE_PROVIDER_SITE_OTHER): Payer: Self-pay

## 2016-05-06 ENCOUNTER — Telehealth: Payer: Self-pay | Admitting: *Deleted

## 2016-05-06 VITALS — BP 112/80 | HR 115 | Ht 63.0 in | Wt 148.0 lb

## 2016-05-06 DIAGNOSIS — S92354D Nondisplaced fracture of fifth metatarsal bone, right foot, subsequent encounter for fracture with routine healing: Secondary | ICD-10-CM

## 2016-05-06 DIAGNOSIS — Z720 Tobacco use: Secondary | ICD-10-CM

## 2016-05-06 DIAGNOSIS — E559 Vitamin D deficiency, unspecified: Secondary | ICD-10-CM

## 2016-05-06 NOTE — Telephone Encounter (Signed)
Patient called and left message requesting refill on her Lexapro. Called patient back let her know we refilled her medication 2 days ago for 30 day supply and she will need to follow up for further refills. Patient states she is seeing a Dr for her foot and she is not going to see more than one Dr at a time. Explained to patient she will need follow up for refills on this type of medication prior to running out of current supply. Patient verbalized understanding.

## 2016-05-06 NOTE — Patient Instructions (Signed)
Keep your boot on for the next 2 weeks when you are on your feet.  Okay to resume weight bearing in 2 weeks out of the boot as long as you remain pain free.

## 2016-05-06 NOTE — Progress Notes (Signed)
OFFICE VISIT NOTE Kathryn Underwood, [place] at [facility] [phone]  Kathryn Underwood - 56 y.o. female MRN [id number]  Date of birth: Mar 30, 1960  Visit Date: 05/06/2016  PCP: Ma Hillock, DO   Referred by: Ma Hillock, DO  SUBJECTIVE:   Chief Complaint  Patient presents with  . fracture of fifth metatarsal bone of right foot    Pt reports improvement with pain. She still has mild pain when walking. She has been wearing her boot most of the time but did notice that when she went without it, the foot was more sore later.    HPI: As above. Additional pertinent information includes:  HPI ROS: Review of Systems  Constitutional: Negative for chills, fever and weight loss.  Cardiovascular: Positive for leg swelling.  Skin: Negative for rash.  Neurological: Negative for sensory change and focal weakness.    Otherwise per HPI.  HISTORY & PERTINENT PRIOR DATA:  No specialty comments available. She reports that she has been smoking Cigarettes.  She has been smoking about 0.50 packs per day. She has never used smokeless tobacco.   Recent Labs  12/09/15 1545  HGBA1C 5.5   Medications & Allergies reviewed per EMR Patient Active Problem List   Diagnosis Date Noted  . MDD (major depressive disorder), recurrent severe, without psychosis ([place]) 04/28/2016  . Alcohol withdrawal ([place]) 04/25/2016  . Tachycardia 04/25/2016  . Transaminitis 04/25/2016  . Leukocytosis 04/25/2016  . Closed nondisplaced fracture of fifth right metatarsal bone 04/08/2016  . Acute pain of right shoulder 04/08/2016  . Vitamin D deficiency 12/11/2015  . Alcohol dependence with unspecified alcohol-induced disorder ([place]) 12/09/2015  . Fatigue 12/09/2015  . Encounter for alcohol abuse counseling and surveillance 12/09/2015  . Tobacco abuse 01/04/2015  . Depression with anxiety 01/04/2015   Past Medical History:  Diagnosis Date  . Anxiety   . Chickenpox   .  Depression   . Neuromuscular disorder (HCC)    neuropathy   Family History  Problem Relation Age of Onset  . Dementia Mother   . Arthritis Father   . Asthma Father   . Asthma Brother   . Diabetes Maternal Grandmother   . Dementia Maternal Grandmother   . Diabetes Paternal Grandmother   . Parkinson's disease Maternal Grandfather   . Parkinson's disease Paternal Grandfather   . Breast cancer Neg Hx   . Colon cancer Neg Hx    Past Surgical History:  Procedure Laterality Date  . CESAREAN SECTION    . TONSILLECTOMY     Social History   Occupational History  . Not on file.   Social History Main Topics  . Smoking status: Current Every Day Smoker    Packs/day: 0.50    Types: Cigarettes  . Smokeless tobacco: Never Used  . Alcohol use 4.8 oz/week    8 Glasses of wine per week     Comment: daily  . Drug use: No  . Sexual activity: Yes    Partners: Male     Comment: female partner    OBJECTIVE:  VS:  HT:5\' 3"  (160 cm)   WT:148 lb (67.1 kg)  BMI:26.3    BP:112/80  HR:(!) 115bpm  TEMP: ( )  RESP:94 % Physical Exam Findings:  WDWN, NAD, Non-toxic appearing Alert & appropriately interactive Not depressed or anxious appearing No increased work of breathing. Pupils are equal. EOM intact without nystagmus No clubbing or cyanosis of the extremities appreciated No significant rashes/lesions/ulcerations  overlying the examined area. DP and PT pulses 1+/4.  No significant pretibial edema.  No clubbing or cyanosis Sensation intact to light touch in lower extremities.  Foot: Small amount of persistent TTP over the base of the fifth metatarsal.  Otherwise overall well aligned with a small degree of osteoarthritic bossing of the midfoot.  She has a moderately high cavus foot.   X-rays obtained at time of the office visit were stable and reviewed with patient  ASSESSMENT & PLAN:   Problem List Items Addressed This Visit    Tobacco abuse   Vitamin D deficiency   Closed  nondisplaced fracture of fifth right metatarsal bone - Primary    We will plan to wean her out of the boot over the next several weeks as outlined in the ES. Continue discussion of this risk of going on to be a malunion/nonunion and she should emphasize that her smoking status does not significantly hinder her healing ability.      Relevant Orders   DG Foot 2 Views Right (Completed)     Follow-up: Return in about 6 weeks (around 06/17/2016) for repeat 3V X-rays.   Otherwise please see problem oriented charting as below.

## 2016-06-08 ENCOUNTER — Telehealth: Payer: Self-pay

## 2016-06-08 NOTE — Telephone Encounter (Signed)
Refill request received from pharmacy. Left message on patients voice mail to return call for an appoitnment for refills.

## 2016-06-10 ENCOUNTER — Telehealth: Payer: Self-pay | Admitting: *Deleted

## 2016-06-10 NOTE — Assessment & Plan Note (Signed)
We will plan to wean her out of the boot over the next several weeks as outlined in the ES. Continue discussion of this risk of going on to be a malunion/nonunion and she should emphasize that her smoking status does not significantly hinder her healing ability.

## 2016-06-10 NOTE — Telephone Encounter (Signed)
Patient called and left message needing refills on Lexapro and requesting Ativan. We had spoke to patient on 05/06/16 and given her 30 day refill on her Lexapro and let her know she would need office visit prior to anymore refills. Left message today letting patient know she will need an appt to get refills . Advised patient to call office to schedule.

## 2016-06-17 ENCOUNTER — Ambulatory Visit: Payer: Self-pay | Admitting: Sports Medicine

## 2016-10-13 ENCOUNTER — Ambulatory Visit: Payer: Self-pay | Admitting: Family Medicine

## 2016-10-13 DIAGNOSIS — Z0289 Encounter for other administrative examinations: Secondary | ICD-10-CM

## 2016-11-08 ENCOUNTER — Inpatient Hospital Stay (HOSPITAL_BASED_OUTPATIENT_CLINIC_OR_DEPARTMENT_OTHER)
Admission: EM | Admit: 2016-11-08 | Discharge: 2016-11-12 | DRG: 897 | Disposition: A | Discharge status: Home / Self Care | Payer: Self-pay | Attending: Internal Medicine | Admitting: Internal Medicine

## 2016-11-08 ENCOUNTER — Encounter (HOSPITAL_BASED_OUTPATIENT_CLINIC_OR_DEPARTMENT_OTHER): Payer: Self-pay | Admitting: *Deleted

## 2016-11-08 DIAGNOSIS — Z720 Tobacco use: Secondary | ICD-10-CM

## 2016-11-08 DIAGNOSIS — R740 Nonspecific elevation of levels of transaminase and lactic acid dehydrogenase [LDH]: Secondary | ICD-10-CM

## 2016-11-08 DIAGNOSIS — F10939 Alcohol use, unspecified with withdrawal, unspecified: Secondary | ICD-10-CM | POA: Diagnosis present

## 2016-11-08 DIAGNOSIS — Z79899 Other long term (current) drug therapy: Secondary | ICD-10-CM

## 2016-11-08 DIAGNOSIS — F332 Major depressive disorder, recurrent severe without psychotic features: Secondary | ICD-10-CM | POA: Diagnosis present

## 2016-11-08 DIAGNOSIS — F10239 Alcohol dependence with withdrawal, unspecified: Secondary | ICD-10-CM | POA: Diagnosis present

## 2016-11-08 DIAGNOSIS — F419 Anxiety disorder, unspecified: Secondary | ICD-10-CM | POA: Diagnosis present

## 2016-11-08 DIAGNOSIS — G479 Sleep disorder, unspecified: Secondary | ICD-10-CM | POA: Diagnosis present

## 2016-11-08 DIAGNOSIS — F17210 Nicotine dependence, cigarettes, uncomplicated: Secondary | ICD-10-CM | POA: Diagnosis present

## 2016-11-08 DIAGNOSIS — F418 Other specified anxiety disorders: Secondary | ICD-10-CM

## 2016-11-08 DIAGNOSIS — F10930 Alcohol use, unspecified with withdrawal, uncomplicated: Secondary | ICD-10-CM

## 2016-11-08 DIAGNOSIS — R7401 Elevation of levels of liver transaminase levels: Secondary | ICD-10-CM | POA: Diagnosis present

## 2016-11-08 DIAGNOSIS — K7010 Alcoholic hepatitis without ascites: Secondary | ICD-10-CM | POA: Diagnosis present

## 2016-11-08 DIAGNOSIS — F1290 Cannabis use, unspecified, uncomplicated: Secondary | ICD-10-CM | POA: Diagnosis present

## 2016-11-08 DIAGNOSIS — F10230 Alcohol dependence with withdrawal, uncomplicated: Principal | ICD-10-CM | POA: Diagnosis present

## 2016-11-08 DIAGNOSIS — Z23 Encounter for immunization: Secondary | ICD-10-CM

## 2016-11-08 LAB — CBC WITH DIFFERENTIAL/PLATELET
Basophils Absolute: 0.1 10*3/uL (ref 0.0–0.1)
Basophils Relative: 1 %
Eosinophils Absolute: 0.1 10*3/uL (ref 0.0–0.7)
Eosinophils Relative: 1 %
HEMATOCRIT: 44.5 % (ref 36.0–46.0)
HEMOGLOBIN: 15.2 g/dL — AB (ref 12.0–15.0)
LYMPHS ABS: 1.8 10*3/uL (ref 0.7–4.0)
LYMPHS PCT: 21 %
MCH: 35.2 pg — AB (ref 26.0–34.0)
MCHC: 34.2 g/dL (ref 30.0–36.0)
MCV: 103 fL — AB (ref 78.0–100.0)
MONOS PCT: 11 %
Monocytes Absolute: 0.9 10*3/uL (ref 0.1–1.0)
NEUTROS PCT: 66 %
Neutro Abs: 5.5 10*3/uL (ref 1.7–7.7)
Platelets: 417 10*3/uL — ABNORMAL HIGH (ref 150–400)
RBC: 4.32 MIL/uL (ref 3.87–5.11)
RDW: 12.7 % (ref 11.5–15.5)
WBC: 8.4 10*3/uL (ref 4.0–10.5)

## 2016-11-08 LAB — HEPATIC FUNCTION PANEL
ALBUMIN: 3.5 g/dL (ref 3.5–5.0)
ALK PHOS: 71 U/L (ref 38–126)
ALT: 95 U/L — AB (ref 14–54)
AST: 100 U/L — ABNORMAL HIGH (ref 15–41)
Bilirubin, Direct: 0.1 mg/dL (ref 0.1–0.5)
Indirect Bilirubin: 0.6 mg/dL (ref 0.3–0.9)
TOTAL PROTEIN: 6.1 g/dL — AB (ref 6.5–8.1)
Total Bilirubin: 0.7 mg/dL (ref 0.3–1.2)

## 2016-11-08 LAB — BASIC METABOLIC PANEL
Anion gap: 12 (ref 5–15)
BUN: 11 mg/dL (ref 6–20)
CHLORIDE: 99 mmol/L — AB (ref 101–111)
CO2: 24 mmol/L (ref 22–32)
CREATININE: 0.67 mg/dL (ref 0.44–1.00)
Calcium: 9.5 mg/dL (ref 8.9–10.3)
GFR calc Af Amer: >60 mL/min (ref >60)
GFR calc non Af Amer: >60 mL/min (ref >60)
GLUCOSE: 105 mg/dL — AB (ref 65–99)
POTASSIUM: 3.9 mmol/L (ref 3.5–5.1)
Sodium: 135 mmol/L (ref 135–145)

## 2016-11-08 LAB — RAPID URINE DRUG SCREEN, HOSP PERFORMED
AMPHETAMINES: NOT DETECTED
BARBITURATES: NOT DETECTED
Benzodiazepines: NOT DETECTED
Cocaine: POSITIVE — AB
OPIATES: NOT DETECTED
TETRAHYDROCANNABINOL: POSITIVE — AB

## 2016-11-08 LAB — CREATININE, SERUM
CREATININE: 0.63 mg/dL (ref 0.44–1.00)
GFR calc Af Amer: >60 mL/min (ref >60)
GFR calc non Af Amer: >60 mL/min (ref >60)

## 2016-11-08 LAB — CBC
HCT: 41.8 % (ref 36.0–46.0)
Hemoglobin: 14.1 g/dL (ref 12.0–15.0)
MCH: 35.3 pg — AB (ref 26.0–34.0)
MCHC: 33.7 g/dL (ref 30.0–36.0)
MCV: 104.8 fL — ABNORMAL HIGH (ref 78.0–100.0)
PLATELETS: 385 10*3/uL (ref 150–400)
RBC: 3.99 MIL/uL (ref 3.87–5.11)
RDW: 13.6 % (ref 11.5–15.5)
WBC: 9.7 10*3/uL (ref 4.0–10.5)

## 2016-11-08 LAB — MAGNESIUM: Magnesium: 2.1 mg/dL (ref 1.7–2.4)

## 2016-11-08 LAB — URINALYSIS, ROUTINE W REFLEX MICROSCOPIC
Glucose, UA: NEGATIVE mg/dL
Hgb urine dipstick: NEGATIVE
KETONES UR: 15 mg/dL — AB
LEUKOCYTES UA: NEGATIVE
NITRITE: NEGATIVE
PH: 5.5 (ref 5.0–8.0)
PROTEIN: NEGATIVE mg/dL
Specific Gravity, Urine: >1.03 — ABNORMAL HIGH (ref 1.005–1.030)

## 2016-11-08 LAB — MRSA PCR SCREENING: MRSA by PCR: NEGATIVE

## 2016-11-08 LAB — PREGNANCY, URINE: Preg Test, Ur: NEGATIVE

## 2016-11-08 LAB — ETHANOL: Alcohol, Ethyl (B): <10 mg/dL (ref <10)

## 2016-11-08 LAB — PHOSPHORUS: Phosphorus: 3.5 mg/dL (ref 2.5–4.6)

## 2016-11-08 MED ORDER — VITAMIN B-12 1000 MCG PO TABS: 1000.0000 ug | ORAL_TABLET | Freq: Every day | ORAL | Status: DC

## 2016-11-08 MED ORDER — SODIUM CHLORIDE 0.9 % IV BOLUS (SEPSIS)
1000.0000 mL | Freq: Once | INTRAVENOUS | Status: AC
  Administered 2016-11-08: 1000 mL via INTRAVENOUS

## 2016-11-08 MED ORDER — LORAZEPAM 1 MG PO TABS
0.0000 mg | ORAL_TABLET | Freq: Four times a day (QID) | ORAL | Status: AC
  Administered 2016-11-08: 2 mg via ORAL
  Administered 2016-11-08 – 2016-11-09 (×4): 1 mg via ORAL
  Filled 2016-11-08 (×5): qty 1
  Filled 2016-11-08: qty 2

## 2016-11-08 MED ORDER — SODIUM CHLORIDE 0.9 % IV SOLN
INTRAVENOUS | Status: DC
  Administered 2016-11-08 – 2016-11-11 (×4): via INTRAVENOUS

## 2016-11-08 MED ORDER — FAMOTIDINE 20 MG PO TABS: 20.0000 mg | ORAL_TABLET | Freq: Two times a day (BID) | ORAL | Status: DC

## 2016-11-08 MED ORDER — THIAMINE HCL 100 MG/ML IJ SOLN: 100.0000 mg | Freq: Every day | INTRAMUSCULAR | Status: DC

## 2016-11-08 MED ORDER — ENOXAPARIN SODIUM 40 MG/0.4ML ~~LOC~~ SOLN
40.0000 mg | SUBCUTANEOUS | Status: DC
  Administered 2016-11-08 – 2016-11-10 (×3): 40 mg via SUBCUTANEOUS
  Filled 2016-11-08 (×3): qty 0.4

## 2016-11-08 MED ORDER — LORAZEPAM 2 MG/ML IJ SOLN: 0.0000 mg | Freq: Two times a day (BID) | INTRAMUSCULAR | Status: DC

## 2016-11-08 MED ORDER — GABAPENTIN 300 MG PO CAPS
600.0000 mg | ORAL_CAPSULE | Freq: Two times a day (BID) | ORAL | Status: DC
  Administered 2016-11-08 – 2016-11-12 (×8): 600 mg via ORAL
  Filled 2016-11-08 (×10): qty 2

## 2016-11-08 MED ORDER — INFLUENZA VAC SPLIT QUAD 0.5 ML IM SUSY
0.5000 mL | PREFILLED_SYRINGE | INTRAMUSCULAR | Status: AC
  Administered 2016-11-09: 0.5 mL via INTRAMUSCULAR
  Filled 2016-11-08: qty 0.5

## 2016-11-08 MED ORDER — NICOTINE 21 MG/24HR TD PT24
21.0000 mg | MEDICATED_PATCH | Freq: Every day | TRANSDERMAL | Status: DC
  Administered 2016-11-08 – 2016-11-12 (×5): 21 mg via TRANSDERMAL
  Filled 2016-11-08 (×5): qty 1

## 2016-11-08 MED ORDER — LORAZEPAM 1 MG PO TABS: 0.0000 mg | ORAL_TABLET | Freq: Two times a day (BID) | ORAL | Status: DC

## 2016-11-08 MED ORDER — VITAMIN B-1 100 MG PO TABS
100.0000 mg | ORAL_TABLET | Freq: Every day | ORAL | Status: DC
  Administered 2016-11-08 – 2016-11-12 (×6): 100 mg via ORAL
  Filled 2016-11-08 (×6): qty 1

## 2016-11-08 MED ORDER — PNEUMOCOCCAL VAC POLYVALENT 25 MCG/0.5ML IJ INJ
0.5000 mL | INJECTION | INTRAMUSCULAR | Status: AC
Start: 2016-11-09 — End: 2016-11-09
  Administered 2016-11-09: 0.5 mL via INTRAMUSCULAR
  Filled 2016-11-08: qty 0.5

## 2016-11-08 MED ORDER — LORAZEPAM 2 MG/ML IJ SOLN
0.0000 mg | Freq: Four times a day (QID) | INTRAMUSCULAR | Status: AC
Start: 2016-11-08 — End: 2016-11-10
  Administered 2016-11-09 (×2): 1 mg via INTRAVENOUS
  Filled 2016-11-08 (×2): qty 1

## 2016-11-08 MED ORDER — ESCITALOPRAM OXALATE 10 MG PO TABS
10.0000 mg | ORAL_TABLET | Freq: Every day | ORAL | Status: DC
  Administered 2016-11-08 – 2016-11-12 (×5): 10 mg via ORAL
  Filled 2016-11-08 (×6): qty 1

## 2016-11-08 NOTE — Progress Notes (Signed)
Patient ID: Kathryn Underwood, female   DOB: 1960-05-04, 56 y.o.   MRN: [id number]  56 yo female with etoh abuse would like alcohol detox, ED requesting detox UDS also + coccaine and THC

## 2016-11-08 NOTE — ED Provider Notes (Addendum)
[place] DEPT [facility] Provider Note   CSN: [id number] Arrival date & time: 11/08/16  0448     History   Chief Complaint Chief Complaint  Patient presents with  . Medical Clearance    HPI Kathryn Underwood is a 56 y.o. female.  HPI  This is a 56 year old female who presents requesting detox from alcohol. She states "I'm ready." Reports last ingesting alcohol less than one hour prior to arrival. She reports daily drinking of "one large and one small bottle of wine." She previously was admitted in April for withdrawal symptoms but declined detox at discharge. She is requesting detox at this time. She reports increasing anxiety, nausea, vomiting. She denies any history of alcohol withdrawal seizures. She reports marijuana use, no other illicit drug use. Patient reports that she has been trying to cut down and maybe has had "one bottle of wine recently per day."  Patient reports increasing sleep disturbance.  Past Medical History:  Diagnosis Date  . Anxiety   . Chickenpox   . Depression   . Neuromuscular disorder [facility])    neuropathy    Patient Active Problem List   Diagnosis Date Noted  . MDD (major depressive disorder), recurrent severe, without psychosis ([place]) 04/28/2016  . Alcohol withdrawal ([place]) 04/25/2016  . Tachycardia 04/25/2016  . Transaminitis 04/25/2016  . Leukocytosis 04/25/2016  . Closed nondisplaced fracture of fifth right metatarsal bone 04/08/2016  . Acute pain of right shoulder 04/08/2016  . Vitamin D deficiency 12/11/2015  . Alcohol dependence with unspecified alcohol-induced disorder ([place]) 12/09/2015  . Fatigue 12/09/2015  . Encounter for alcohol abuse counseling and surveillance 12/09/2015  . Tobacco abuse 01/04/2015  . Depression with anxiety 01/04/2015    Past Surgical History:  Procedure Laterality Date  . CESAREAN SECTION    . TONSILLECTOMY      OB History    No data available       Home Medications    Prior to Admission medications    Medication Sig Start Date End Date Taking? Authorizing Provider  buPROPion (WELLBUTRIN SR) 200 MG 12 hr tablet Take 1 tablet by mouth 2 (two) times daily after a meal. 10/24/15   [provider]  cholecalciferol (VITAMIN D) 1000 units tablet Take 2,000 Units by mouth daily.    [provider]  cyanocobalamin 1000 MCG tablet Take 1,000 mcg by mouth daily.     [provider]  escitalopram (LEXAPRO) 20 MG tablet Take 1 tablet (20 mg total) by mouth daily. 05/04/16   Kuneff, Renee A, DO  famotidine (PEPCID) 20 MG tablet Take 1 tablet (20 mg total) by mouth 2 (two) times daily. Take with NSAID 04/08/16   Gerda Diss, DO  folic acid (FOLVITE) 1 MG tablet Take 1 tablet (1 mg total) by mouth daily. 04/29/16   Raiford Noble Latif, DO  gabapentin (NEURONTIN) 300 MG capsule Take 600-900 capsules by mouth daily. Take 600 mg for regular anxiety and 900 mg for severe anxiety 10/24/15   [provider]  Multiple Vitamin (MULTIVITAMIN) capsule Take 1 capsule by mouth daily.    [provider]  thiamine 100 MG tablet Take 1 tablet (100 mg total) by mouth daily. 04/29/16   Kerney Elbe, DO    Family History Family History  Problem Relation Age of Onset  . Dementia Mother   . Arthritis Father   . Asthma Father   . Asthma Brother   . Diabetes Maternal Grandmother   . Dementia Maternal Grandmother   .  Diabetes Paternal Grandmother   . Parkinson's disease Maternal Grandfather   . Parkinson's disease Paternal Grandfather   . Breast cancer Neg Hx   . Colon cancer Neg Hx     Social History Social History  Substance Use Topics  . Smoking status: Current Every Day Smoker    Packs/day: 0.50    Types: Cigarettes  . Smokeless tobacco: Never Used  . Alcohol use 4.8 oz/week    8 Glasses of wine per week     Comment: daily     Allergies   Patient has no known allergies.   Review of Systems Review of Systems  Constitutional: Negative for fever.    Respiratory: Negative for shortness of breath.   Cardiovascular: Negative for chest pain.  Gastrointestinal: Positive for nausea and vomiting.  Neurological: Negative for seizures.  Psychiatric/Behavioral: Positive for sleep disturbance. The patient is nervous/anxious.   All other systems reviewed and are negative.    Physical Exam Updated Vital Signs BP 112/81   Pulse (!) 102   Temp 98.6 F (37 C) (Oral)   Resp (!) 23   Ht 5\' 3"  (1.6 m)   Wt 65.8 kg (145 lb)   SpO2 91%   BMI 25.69 kg/m   Physical Exam  Constitutional: She is oriented to person, place, and time.  Disheveled appearing but nontoxic, no acute distress  HENT:  Head: Normocephalic and atraumatic.  Poor dentition  Eyes: Pupils are equal, round, and reactive to light.  Cardiovascular: Regular rhythm and normal heart sounds.   tachycardia  Pulmonary/Chest: Effort normal and breath sounds normal. No respiratory distress. She has no wheezes.  Abdominal: Soft. Bowel sounds are normal. There is no tenderness.  Neurological: She is alert and oriented to person, place, and time.  Fine resting tremor noted  Skin: Skin is warm and dry.  Psychiatric:  Depressed appearing, flat affect  Nursing note and vitals reviewed.    ED Treatments / Results  Labs (all labs ordered are listed, but only abnormal results are displayed) Labs Reviewed  CBC WITH DIFFERENTIAL/PLATELET - Abnormal; Notable for the following:       Result Value   Hemoglobin 15.2 (*)    MCV 103.0 (*)    MCH 35.2 (*)    Platelets 417 (*)    All other components within normal limits  BASIC METABOLIC PANEL - Abnormal; Notable for the following:    Chloride 99 (*)    Glucose, Bld 105 (*)    All other components within normal limits  RAPID URINE DRUG SCREEN, HOSP PERFORMED - Abnormal; Notable for the following:    Cocaine POSITIVE (*)    Tetrahydrocannabinol POSITIVE (*)    All other components within normal limits  PREGNANCY, URINE  ETHANOL   URINALYSIS, ROUTINE W REFLEX MICROSCOPIC    EKG  EKG Interpretation  Date/Time:  Sunday November 08 2016 05:42:31 EDT Ventricular Rate:  104 PR Interval:    QRS Duration: 85 QT Interval:  362 QTC Calculation: 477 R Axis:   36 Text Interpretation:  Sinus tachycardia Anterior infarct, old No significant change since last tracing Confirmed by Thayer Jew [phone]) on 11/08/2016 5:47:08 AM       Radiology No results found.  Procedures Procedures (including critical care time)  CRITICAL CARE Performed by: Merryl Hacker   Total critical care time: 25 minutes  Critical care time was exclusive of separately billable procedures and treating other patients.  Critical care was necessary to treat or prevent imminent or life-threatening deterioration.  Critical care was time spent personally by me on the following activities: development of treatment plan with patient and/or surrogate as well as nursing, discussions with consultants, evaluation of patient's response to treatment, examination of patient, obtaining history from patient or surrogate, ordering and performing treatments and interventions, ordering and review of laboratory studies, ordering and review of radiographic studies, pulse oximetry and re-evaluation of patient's condition.   Medications Ordered in ED Medications  LORazepam (ATIVAN) injection 0-4 mg ( Intravenous See Alternative 11/08/16 0529)    Or  LORazepam (ATIVAN) tablet 0-4 mg (2 mg Oral Given 11/08/16 0529)  LORazepam (ATIVAN) injection 0-4 mg (not administered)    Or  LORazepam (ATIVAN) tablet 0-4 mg (not administered)  thiamine (VITAMIN B-1) tablet 100 mg (100 mg Oral Given 11/08/16 0529)    Or  thiamine (B-1) injection 100 mg ( Intravenous See Alternative 11/08/16 0529)  sodium chloride 0.9 % bolus 1,000 mL (1,000 mLs Intravenous New Bag/Given 11/08/16 0602)     Initial Impression / Assessment and Plan / ED Course  I have reviewed the  triage vital signs and the nursing notes.  Pertinent labs & imaging results that were available during my care of the patient were reviewed by me and considered in my medical decision making (see chart for details).     Patient presents requesting detox.She is disheveled but nontoxic-appearing. She is tachycardic. Initial CIWA assessment 16 for tachycardia, tremor, anxiety, nausea and vomiting. Patient was given 2 mg of Ativan. Workup initiated.patient was given a fluid bolus and IV thiamine.  6:28 AM Patient positive for cocaine and marijuana.blood alcohol level pending. Patient improved with 2 mg of Ativan IV.See was score places patient in high-risk category for outpatient management. Will consult the hospitalist for inpatient admissionand medical management for acute withdrawal symptoms.  Discussed with hospitalist, Dr. Maudie Mercury.  Patient to be transferred to SDU.  Final Clinical Impressions(s) / ED Diagnoses   Final diagnoses:  Alcohol withdrawal syndrome without complication [facility])    New Prescriptions New Prescriptions   No medications on file     Merryl Hacker, MD 11/08/16 4628    Merryl Hacker, MD 11/08/16 [phone]

## 2016-11-08 NOTE — ED Notes (Signed)
Report given to Excell Seltzer 1223 nurse

## 2016-11-08 NOTE — ED Triage Notes (Signed)
Here for detox from alcohol, "ready to follow through", last drink (a swallow) of ETOH ~ 1 hr ago, reports nvd and anxiousness (denies: hallucinations, drug use, HA, tactile disturbance), last ate 24 hrs ago, last bath 3d ago. Here with boyfriend Cherlynn Kaiser).

## 2016-11-08 NOTE — H&P (Signed)
History and Physical    Kathryn Underwood XAJ:[id number] DOB: 1961/01/05 DOA: 11/08/2016  PCP: Ma Hillock, DO  Patient coming from: home  I have personally briefly reviewed patient's old medical records in [place]  Chief Complaint: alcohol withdrawal   HPI: Kathryn Underwood is a 56 y.o. female with medical history significant of polysubstance abuse, alcohol abuse, depression and anxiety presented for alcohol withdrawal.  Mrs. Isaacks notes feeling poorly over the past several months. She notes that she's had decreased energy decreased appetite, shakes, frequent urination (but no dysuria), palpitations, shortness of breath.  She denies CP or SOB at this time.  She notes that she is ready to quit alcohol and drugs.  She drinks 1.5 L of wine dailyher last drink was this morning at 4:30 AM. She denies a history of alcohol withdrawal seizures or DTs. This is her third admission for alcohol withdrawal.  ED Course: IVF, CIWA, ativan.   Review of Systems: As per HPI otherwise 10 point review of systems negative.   Past Medical History:  Diagnosis Date  . Anxiety   . Chickenpox   . Depression   . Neuromuscular disorder ([place])    neuropathy    Past Surgical History:  Procedure Laterality Date  . CESAREAN SECTION    . TONSILLECTOMY       reports that she has been smoking Cigarettes.  She has been smoking about 0.50 packs per day. She has never used smokeless tobacco. She reports that she drinks about 4.8 oz of alcohol per week . She reports that she does not use drugs.  No Known Allergies  Family History  Problem Relation Age of Onset  . Dementia Mother   . Arthritis Father   . Asthma Father   . Asthma Brother   . Diabetes Maternal Grandmother   . Dementia Maternal Grandmother   . Diabetes Paternal Grandmother   . Parkinson's disease Maternal Grandfather   . Parkinson's disease Paternal Grandfather   . Breast cancer Neg Hx   . Colon cancer Neg Hx    Prior to  Admission medications   Medication Sig Start Date End Date Taking? Authorizing Provider  buPROPion (WELLBUTRIN SR) 200 MG 12 hr tablet Take 1 tablet by mouth 2 (two) times daily after a meal. 10/24/15   [provider]  cholecalciferol (VITAMIN D) 1000 units tablet Take 2,000 Units by mouth daily.    [provider]  cyanocobalamin 1000 MCG tablet Take 1,000 mcg by mouth daily.     [provider]  escitalopram (LEXAPRO) 20 MG tablet Take 1 tablet (20 mg total) by mouth daily. 05/04/16   Kuneff, Renee A, DO  famotidine (PEPCID) 20 MG tablet Take 1 tablet (20 mg total) by mouth 2 (two) times daily. Take with NSAID 04/08/16   Gerda Diss, DO  folic acid (FOLVITE) 1 MG tablet Take 1 tablet (1 mg total) by mouth daily. 04/29/16   Raiford Noble Latif, DO  gabapentin (NEURONTIN) 300 MG capsule Take 600-900 capsules by mouth daily. Take 600 mg for regular anxiety and 900 mg for severe anxiety 10/24/15   [provider]  Multiple Vitamin (MULTIVITAMIN) capsule Take 1 capsule by mouth daily.    [provider]  thiamine 100 MG tablet Take 1 tablet (100 mg total) by mouth daily. 04/29/16   Raiford Noble [place], DO    Physical Exam: Vitals:   11/08/16 0730 11/08/16 0730 11/08/16 0849 11/08/16 0856  BP: 110/79 110/79  116/86  Pulse: (!) 119 (!) 113  (!) 110  Resp: 17 (!) 23    Temp:  98 F (36.7 C)    TempSrc:  Oral    SpO2: 94% 94%  94%  Weight:   65.1 kg (143 lb 8.3 oz)   Height:   5\' 3"  (1.6 m)     Constitutional: NAD, calm, comfortable Vitals:   11/08/16 0730 11/08/16 0730 11/08/16 0849 11/08/16 0856  BP: 110/79 110/79  116/86  Pulse: (!) 119 (!) 113  (!) 110  Resp: 17 (!) 23    Temp:  98 F (36.7 C)    TempSrc:  Oral    SpO2: 94% 94%  94%  Weight:   65.1 kg (143 lb 8.3 oz)   Height:   5\' 3"  (1.6 m)    Eyes: PERRL, lids and conjunctivae normal ENMT: Mucous membranes are moist. Posterior pharynx clear of any exudate or lesions.Normal dentition.    Neck: normal, supple, no masses, no thyromegaly Respiratory: clear to auscultation bilaterally, no wheezing, no crackles. Normal respiratory effort. No accessory muscle use.  Cardiovascular: tachycardic rate and rhythm, no murmurs / rubs / gallops. No extremity edema. 2+ pedal pulses.  Abdomen: no tenderness, no masses palpated. No hepatosplenomegaly. Bowel sounds positive.  Musculoskeletal: no clubbing / cyanosis. No joint deformity upper and lower extremities. Good ROM, no contractures. Normal muscle tone.  Skin: no rashes, lesions, ulcers. No induration Neurologic: CN 2-12 grossly intact. Sensation intact. Strength 5/5 in all 4.  Psychiatric: Normal judgment and insight. Alert and oriented x 3. Normal mood.   Labs on Admission: I have personally reviewed following labs and imaging studies  CBC:  Recent Labs Lab 11/08/16 0530  WBC 8.4  NEUTROABS 5.5  HGB 15.2*  HCT 44.5  MCV 103.0*  PLT 833*   Basic Metabolic Panel:  Recent Labs Lab 11/08/16 0530  NA 135  K 3.9  CL 99*  CO2 24  GLUCOSE 105*  BUN 11  CREATININE 0.67  CALCIUM 9.5   GFR: Estimated Creatinine Clearance: 70.4 mL/min (by C-G formula based on SCr of 0.67 mg/dL). Liver Function Tests: No results for input(s): AST, ALT, ALKPHOS, BILITOT, PROT, ALBUMIN in the last 168 hours. No results for input(s): LIPASE, AMYLASE in the last 168 hours. No results for input(s): AMMONIA in the last 168 hours. Coagulation Profile: No results for input(s): INR, PROTIME in the last 168 hours. Cardiac Enzymes: No results for input(s): CKTOTAL, CKMB, CKMBINDEX, TROPONINI in the last 168 hours. BNP (last 3 results) No results for input(s): PROBNP in the last 8760 hours. HbA1C: No results for input(s): HGBA1C in the last 72 hours. CBG: No results for input(s): GLUCAP in the last 168 hours. Lipid Profile: No results for input(s): CHOL, HDL, LDLCALC, TRIG, CHOLHDL, LDLDIRECT in the last 72 hours. Thyroid Function Tests: No  results for input(s): TSH, T4TOTAL, FREET4, T3FREE, THYROIDAB in the last 72 hours. Anemia Panel: No results for input(s): VITAMINB12, FOLATE, FERRITIN, TIBC, IRON, RETICCTPCT in the last 72 hours. Urine analysis:    Component Value Date/Time   COLORURINE YELLOW 11/08/2016 0510   APPEARANCEUR HAZY (A) 11/08/2016 0510   LABSPEC >1.030 (H) 11/08/2016 0510   PHURINE 5.5 11/08/2016 0510   GLUCOSEU NEGATIVE 11/08/2016 0510   HGBUR NEGATIVE 11/08/2016 0510   BILIRUBINUR SMALL (A) 11/08/2016 0510   KETONESUR 15 (A) 11/08/2016 0510   PROTEINUR NEGATIVE 11/08/2016 0510   UROBILINOGEN 0.2 01/23/2010 1750   NITRITE NEGATIVE 11/08/2016 0510   LEUKOCYTESUR NEGATIVE 11/08/2016 0510  Radiological Exams on Admission: No results found.  EKG: Independently reviewed. Sinus tach, similar to priors, slightly prolonged QTc  Assessment/Plan Active Problems:   Alcohol withdrawal (HCC)  Etoh Withdrawal:  Drinking 1.5 L of wine daily.Last drink 4:30 AM on 10/14. No history of DTs or withdrawal seizures. Initial CIWA 16 in the ED, 2 here on presentation CIWA protocol when necessary benzo Thiamine, MVI, folate  Palpitations  SOB: She noted palpitations and SOB as part of her reason for detoxing.  I think these are related to anxiety or maybe withdrawal as she notes these are primarily the times when she notes these sx.  She's asx on room air at time of my evaluation.   Telemetry  Tachycardia:  Likely related to etoh withdrawal.  No CP or SOB.  EKG with sinus tach.  Ctm, IVF Consider echo if no improvement  Elevated liver enzymes: likely secondary to EtOH abuse. She had negative hepatitis B&C studies in April CTM  Polysubstance abuse: encouraged cessation of etoh, MJ, cocaine.  Social work c/s  Depression and anxiety: lexapro, gabapentin  DVT prophylaxis: lovenox  Code Status: full  Family Communication: none  Disposition Plan: with improvement  Consults called: none  Admission status:  inpatient    Fayrene Helper MD [facility] [phone]  If 7PM-7AM, please contact night-coverage [email or web address] Password [facility]  11/08/2016, 9:37 AM

## 2016-11-08 NOTE — ED Notes (Signed)
Patient is A & O x4.  She understood she is going to [place] room.

## 2016-11-08 NOTE — ED Notes (Signed)
Resting/sleeping, no complaints.

## 2016-11-09 LAB — COMPREHENSIVE METABOLIC PANEL
ALBUMIN: 3 g/dL — AB (ref 3.5–5.0)
ALK PHOS: 61 U/L (ref 38–126)
ALT: 71 U/L — AB (ref 14–54)
ANION GAP: 8 (ref 5–15)
AST: 85 U/L — ABNORMAL HIGH (ref 15–41)
BUN: 15 mg/dL (ref 6–20)
CHLORIDE: 105 mmol/L (ref 101–111)
CO2: 24 mmol/L (ref 22–32)
Calcium: 8.6 mg/dL — ABNORMAL LOW (ref 8.9–10.3)
Creatinine, Ser: 0.59 mg/dL (ref 0.44–1.00)
GFR calc non Af Amer: >60 mL/min (ref >60)
GLUCOSE: 105 mg/dL — AB (ref 65–99)
Potassium: 4.5 mmol/L (ref 3.5–5.1)
SODIUM: 137 mmol/L (ref 135–145)
Total Bilirubin: 0.8 mg/dL (ref 0.3–1.2)
Total Protein: 5.3 g/dL — ABNORMAL LOW (ref 6.5–8.1)

## 2016-11-09 LAB — CBC
HCT: 38.4 % (ref 36.0–46.0)
HEMOGLOBIN: 13.2 g/dL (ref 12.0–15.0)
MCH: 36.4 pg — AB (ref 26.0–34.0)
MCHC: 34.4 g/dL (ref 30.0–36.0)
MCV: 105.8 fL — ABNORMAL HIGH (ref 78.0–100.0)
PLATELETS: 243 10*3/uL (ref 150–400)
RBC: 3.63 MIL/uL — AB (ref 3.87–5.11)
RDW: 13.7 % (ref 11.5–15.5)
WBC: 7.4 10*3/uL (ref 4.0–10.5)

## 2016-11-09 NOTE — Care Management Note (Signed)
Case Management Note  Patient Details  Name: MATYLDA FEHRING MRN: [id number] Date of Birth: [birth year]  Subjective/Objective:                  Poly substance abuse  Action/Plan: Date:  October 15,2 018 Chart reviewed for concurrent status and case management needs.  Will continue to follow patient progress.  Discharge Planning: following for needs  Expected discharge date: [id number]  Dorthea Maina, BSN, [place], [place]   Expected Discharge Date:                  Expected Discharge Plan:  [facility]  In-House Referral:  Financial Counselor, Clinical Social Work  Discharge planning Services  CM Consult  Post Acute Care Choice:    Choice offered to:     DME Arranged:    DME Agency:     HH Arranged:    [place] Agency:     Status of Service:  In process, will continue to follow  If discussed at Long Length of Stay Meetings, dates discussed:    Additional Comments:  Neyda, [place], RN 11/09/2016, 8:43 AM

## 2016-11-09 NOTE — Progress Notes (Signed)
Patient ID: Kathryn Underwood, female   DOB: [birth year], 56 y.o.   MRN: [id number]  PROGRESS NOTE    CHESSA BARRASSO  WEX:[id number] DOB: January 08, 1961 DOA: 11/08/2016  PCP: Ma Hillock, DO   Brief Narrative:  56 year old female with history of polysubstance abuse, alcohol abuse, anxiety and depression who presented to ED for alcohol withdrawal. Apparently she has had a decreased energy level, decreased appetite over past several months. She drinks a 1.5 L of wine on daily basis and her last drink was the morning of the admission.   Assessment & Plan:   Principal Problem:   Acute Alcohol withdrawal ([place]) - Continue CIWA protocol - continue multivitamin, thiamine and folic acid - Continue supportive care with IV fluids - Stable for transfer to medical floor  Active Problems:   Tobacco abuse - Counseled on smoking cessation - Continue nicotine patch    Alcoholic hepatitis - Monitor LFTs    Depression with anxiety - Stable - Patient is not suicidal - continue Lexapro     DVT prophylaxis: Lovenox subcutaneous Code Status: full code  Family Communication: no family at the bedside Disposition Plan: transfer to telemetry floor   Consultants:   None   Procedures:  None   Antimicrobials:   None     Subjective: No overnight events.  Objective: Vitals:   11/09/16 0500 11/09/16 0520 11/09/16 0600 11/09/16 0700  BP:   111/67   Pulse: 99 96 96 85  Resp: (!) 22 (!) 21 (!) 22 (!) 36  Temp:      TempSrc:      SpO2: (!) 85% 96% 98% 99%  Weight:      Height:        Intake/Output Summary (Last 24 hours) at 11/09/16 0840 Last data filed at 11/09/16 0500  Gross per 24 hour  Intake          1166.67 ml  Output              300 ml  Net           866.67 ml   Filed Weights   11/08/16 0508 11/08/16 0849  Weight: 65.8 kg (145 lb) 65.1 kg (143 lb 8.3 oz)    Examination:  General exam: Appears calm and comfortable  Respiratory system: Clear to auscultation.  Respiratory effort normal. Cardiovascular system: S1 & S2 heard, Rate controlled  Gastrointestinal system: Abdomen is nondistended, soft and nontender. No organomegaly or masses felt. Normal bowel sounds heard. Central nervous system: Alert and oriented. No focal neurological deficits. Extremities: Symmetric 5 x 5 power. Skin: No rashes, lesions or ulcers Psychiatry: Judgement and insight appear normal. Mood & affect appropriate.   Data Reviewed: I have personally reviewed following labs and imaging studies  CBC:  Recent Labs Lab 11/08/16 0530 11/08/16 1118 11/09/16 0351  WBC 8.4 9.7 7.4  NEUTROABS 5.5  --   --   HGB 15.2* 14.1 13.2  HCT 44.5 41.8 38.4  MCV 103.0* 104.8* 105.8*  PLT 417* 385 938   Basic Metabolic Panel:  Recent Labs Lab 11/08/16 0530 11/08/16 1118 11/09/16 0351  NA 135  --  137  K 3.9  --  4.5  CL 99*  --  105  CO2 24  --  24  GLUCOSE 105*  --  105*  BUN 11  --  15  CREATININE 0.67 0.63 0.59  CALCIUM 9.5  --  8.6*  MG  --  2.1  --   PHOS  --  3.5  --    GFR: Estimated Creatinine Clearance: 70.4 mL/min (by C-G formula based on SCr of 0.59 mg/dL). Liver Function Tests:  Recent Labs Lab 11/08/16 1118 11/09/16 0351  AST 100* 85*  ALT 95* 71*  ALKPHOS 71 61  BILITOT 0.7 0.8  PROT 6.1* 5.3*  ALBUMIN 3.5 3.0*   No results for input(s): LIPASE, AMYLASE in the last 168 hours. No results for input(s): AMMONIA in the last 168 hours. Coagulation Profile: No results for input(s): INR, PROTIME in the last 168 hours. Cardiac Enzymes: No results for input(s): CKTOTAL, CKMB, CKMBINDEX, TROPONINI in the last 168 hours. BNP (last 3 results) No results for input(s): PROBNP in the last 8760 hours. HbA1C: No results for input(s): HGBA1C in the last 72 hours. CBG: No results for input(s): GLUCAP in the last 168 hours. Lipid Profile: No results for input(s): CHOL, HDL, LDLCALC, TRIG, CHOLHDL, LDLDIRECT in the last 72 hours. Thyroid Function Tests: No  results for input(s): TSH, T4TOTAL, FREET4, T3FREE, THYROIDAB in the last 72 hours. Anemia Panel: No results for input(s): VITAMINB12, FOLATE, FERRITIN, TIBC, IRON, RETICCTPCT in the last 72 hours. Urine analysis:    Component Value Date/Time   COLORURINE YELLOW 11/08/2016 0510   APPEARANCEUR HAZY (A) 11/08/2016 0510   LABSPEC >1.030 (H) 11/08/2016 0510   PHURINE 5.5 11/08/2016 0510   GLUCOSEU NEGATIVE 11/08/2016 0510   HGBUR NEGATIVE 11/08/2016 0510   BILIRUBINUR SMALL (A) 11/08/2016 0510   KETONESUR 15 (A) 11/08/2016 0510   PROTEINUR NEGATIVE 11/08/2016 0510   UROBILINOGEN 0.2 01/23/2010 1750   NITRITE NEGATIVE 11/08/2016 0510   LEUKOCYTESUR NEGATIVE 11/08/2016 0510   Sepsis Labs: @LABRCNTIP (procalcitonin:4,lacticidven:4)    Recent Results (from the past 240 hour(s))  MRSA PCR Screening     Status: None   Collection Time: 11/08/16 10:34 AM  Result Value Ref Range Status   MRSA by PCR NEGATIVE NEGATIVE Final      Radiology Studies: No results found.   Scheduled Meds: . enoxaparin (LOVENOX) injection  40 mg Subcutaneous Q24H  . escitalopram  10 mg Oral Daily  . gabapentin  600 mg Oral BID  . Influenza vac split quadrivalent PF  0.5 mL Intramuscular Tomorrow-1000  . LORazepam  0-4 mg Intravenous Q6H   Or  . LORazepam  0-4 mg Oral Q6H  . [START ON 11/10/2016] LORazepam  0-4 mg Intravenous Q12H   Or  . [START ON 11/10/2016] LORazepam  0-4 mg Oral Q12H  . nicotine  21 mg Transdermal Daily  . pneumococcal 23 valent vaccine  0.5 mL Intramuscular Tomorrow-1000  . thiamine  100 mg Oral Daily   Or  . thiamine  100 mg Intravenous Daily   Continuous Infusions: . sodium chloride 100 mL/hr at 11/09/16 0500     LOS: 1 day    Time spent: 25 minutes  Greater than 50% of the time spent on counseling and coordinating the care.   Leisa Lenz, MD [facility] Pager [phone]  If 7PM-7AM, please contact night-coverage [email or web address] Password [email or web address] 11/09/2016,  8:40 AM

## 2016-11-10 LAB — BASIC METABOLIC PANEL
Anion gap: 9 (ref 5–15)
BUN: 10 mg/dL (ref 6–20)
CO2: 24 mmol/L (ref 22–32)
Calcium: 8.7 mg/dL — ABNORMAL LOW (ref 8.9–10.3)
Chloride: 107 mmol/L (ref 101–111)
Creatinine, Ser: 0.58 mg/dL (ref 0.44–1.00)
GFR calc Af Amer: >60 mL/min (ref >60)
GFR calc non Af Amer: >60 mL/min (ref >60)
Glucose, Bld: 93 mg/dL (ref 65–99)
Potassium: 3.2 mmol/L — ABNORMAL LOW (ref 3.5–5.1)
Sodium: 140 mmol/L (ref 135–145)

## 2016-11-10 LAB — CBC
HCT: 39.6 % (ref 36.0–46.0)
Hemoglobin: 13.1 g/dL (ref 12.0–15.0)
MCH: 35.6 pg — ABNORMAL HIGH (ref 26.0–34.0)
MCHC: 33.1 g/dL (ref 30.0–36.0)
MCV: 107.6 fL — ABNORMAL HIGH (ref 78.0–100.0)
Platelets: 376 10*3/uL (ref 150–400)
RBC: 3.68 MIL/uL — ABNORMAL LOW (ref 3.87–5.11)
RDW: 13.6 % (ref 11.5–15.5)
WBC: 8.3 10*3/uL (ref 4.0–10.5)

## 2016-11-10 MED ORDER — LORAZEPAM 1 MG PO TABS
1.0000 mg | ORAL_TABLET | Freq: Four times a day (QID) | ORAL | Status: DC | PRN
  Administered 2016-11-11 – 2016-11-12 (×6): 1 mg via ORAL
  Filled 2016-11-10 (×6): qty 1

## 2016-11-10 MED ORDER — LORAZEPAM 2 MG/ML IJ SOLN: 0.0000 mg | Freq: Four times a day (QID) | INTRAMUSCULAR | Status: DC

## 2016-11-10 MED ORDER — LORAZEPAM 2 MG/ML IJ SOLN
1.0000 mg | Freq: Two times a day (BID) | INTRAMUSCULAR | Status: AC
  Filled 2016-11-10: qty 1

## 2016-11-10 MED ORDER — LORAZEPAM 2 MG/ML IJ SOLN
0.0000 mg | Freq: Four times a day (QID) | INTRAMUSCULAR | Status: AC
Start: 2016-11-10 — End: 2016-11-10
  Administered 2016-11-10 (×2): 2 mg via INTRAVENOUS
  Filled 2016-11-10 (×2): qty 1

## 2016-11-10 MED ORDER — LORAZEPAM 2 MG/ML IJ SOLN: 0.0000 mg | Freq: Two times a day (BID) | INTRAMUSCULAR | Status: DC

## 2016-11-10 MED ORDER — LORAZEPAM 1 MG PO TABS: 0.0000 mg | ORAL_TABLET | Freq: Four times a day (QID) | ORAL | Status: DC

## 2016-11-10 MED ORDER — LORAZEPAM 2 MG/ML IJ SOLN
1.0000 mg | Freq: Four times a day (QID) | INTRAMUSCULAR | Status: DC | PRN
  Administered 2016-11-10: 1 mg via INTRAVENOUS
  Filled 2016-11-10: qty 1

## 2016-11-10 NOTE — Progress Notes (Signed)
Patient ID: Kathryn Underwood, female   DOB: 09-26-1960, 56 y.o.   MRN: [id number]  PROGRESS NOTE    Kathryn Underwood  [id number] DOB: 22-Sep-1960 DOA: 11/08/2016  PCP: Ma Hillock, DO   Brief Narrative:  56 year old female with history of polysubstance abuse, alcohol abuse, anxiety and depression who presented to ED for alcohol withdrawal. Apparently she has had a decreased energy level, decreased appetite over past several months. She drinks a 1.5 L of wine on daily basis and her last drink was the morning of the admission.   Assessment & Plan:   Principal Problem:   Acute Alcohol withdrawal (HCC) - Continue CIWA protocol  - Continue thiamine, folic acid and multivitamin - Continue to monitor for next 24 hours, still tremulous   Active Problems:   Tobacco abuse - Counseled on smoking cessation - Continue nicotine patch     Alcoholic hepatitis - Repeat LFT's in am    Depression with anxiety - Stable  - Continue Lexapro   DVT prophylaxis: Lovenox subQ Code Status: full code  Family Communication: no family at the bedside  Disposition Plan: home in am if she feels better    Consultants:   None   Procedures:  None   Antimicrobials:   None     Subjective: Still tremulous.  Objective: Vitals:   11/09/16 2200 11/09/16 2300 11/09/16 2342 11/10/16 0557  BP:   139/89 115/84  Pulse: (!) 101 (!) 103 (!) 109 (!) 114  Resp: (!) 26 (!) 29 20 19   Temp:   98.6 F (37 C) 98.3 F (36.8 C)  TempSrc:   Oral Oral  SpO2: 90% 93% 96% 95%  Weight:      Height:        Intake/Output Summary (Last 24 hours) at 11/10/16 1003 Last data filed at 11/10/16 0840  Gross per 24 hour  Intake          2918.33 ml  Output             1126 ml  Net          1792.33 ml   Filed Weights   11/08/16 0508 11/08/16 0849  Weight: 65.8 kg (145 lb) 65.1 kg (143 lb 8.3 oz)    Physical Exam  Constitutional: Appears well-developed and well-nourished. No distress.  CVS: RRR, S1/S2 +,  no murmurs, no gallops, no carotid bruit.  Pulmonary: Effort and breath sounds normal, no stridor, rhonchi, wheezes, rales.  Abdominal: Soft. BS +,  no distension, tenderness, rebound or guarding.  Musculoskeletal: Normal range of motion. Tremulous hands  Lymphadenopathy: No lymphadenopathy noted, cervical, inguinal. Neuro: Alert. No focal deficits  Skin: Skin is warm and dry. Psychiatric: Normal mood and affect.   Data Reviewed: I have personally reviewed following labs and imaging studies  CBC:  Recent Labs Lab 11/08/16 0530 11/08/16 1118 11/09/16 0351 11/10/16 0604  WBC 8.4 9.7 7.4 8.3  NEUTROABS 5.5  --   --   --   HGB 15.2* 14.1 13.2 13.1  HCT 44.5 41.8 38.4 39.6  MCV 103.0* 104.8* 105.8* 107.6*  PLT 417* 385 243 160   Basic Metabolic Panel:  Recent Labs Lab 11/08/16 0530 11/08/16 1118 11/09/16 0351 11/10/16 0604  NA 135  --  137 140  K 3.9  --  4.5 3.2*  CL 99*  --  105 107  CO2 24  --  24 24  GLUCOSE 105*  --  105* 93  BUN 11  --  15 10  CREATININE 0.67 0.63 0.59 0.58  CALCIUM 9.5  --  8.6* 8.7*  MG  --  2.1  --   --   PHOS  --  3.5  --   --    GFR: Estimated Creatinine Clearance: 70.4 mL/min (by C-G formula based on SCr of 0.58 mg/dL). Liver Function Tests:  Recent Labs Lab 11/08/16 1118 11/09/16 0351  AST 100* 85*  ALT 95* 71*  ALKPHOS 71 61  BILITOT 0.7 0.8  PROT 6.1* 5.3*  ALBUMIN 3.5 3.0*   No results for input(s): LIPASE, AMYLASE in the last 168 hours. No results for input(s): AMMONIA in the last 168 hours. Coagulation Profile: No results for input(s): INR, PROTIME in the last 168 hours. Cardiac Enzymes: No results for input(s): CKTOTAL, CKMB, CKMBINDEX, TROPONINI in the last 168 hours. BNP (last 3 results) No results for input(s): PROBNP in the last 8760 hours. HbA1C: No results for input(s): HGBA1C in the last 72 hours. CBG: No results for input(s): GLUCAP in the last 168 hours. Lipid Profile: No results for input(s): CHOL, HDL,  LDLCALC, TRIG, CHOLHDL, LDLDIRECT in the last 72 hours. Thyroid Function Tests: No results for input(s): TSH, T4TOTAL, FREET4, T3FREE, THYROIDAB in the last 72 hours. Anemia Panel: No results for input(s): VITAMINB12, FOLATE, FERRITIN, TIBC, IRON, RETICCTPCT in the last 72 hours. Urine analysis:    Component Value Date/Time   COLORURINE YELLOW 11/08/2016 0510   APPEARANCEUR HAZY (A) 11/08/2016 0510   LABSPEC >1.030 (H) 11/08/2016 0510   PHURINE 5.5 11/08/2016 0510   GLUCOSEU NEGATIVE 11/08/2016 0510   HGBUR NEGATIVE 11/08/2016 0510   BILIRUBINUR SMALL (A) 11/08/2016 0510   KETONESUR 15 (A) 11/08/2016 0510   PROTEINUR NEGATIVE 11/08/2016 0510   UROBILINOGEN 0.2 01/23/2010 1750   NITRITE NEGATIVE 11/08/2016 0510   LEUKOCYTESUR NEGATIVE 11/08/2016 0510   Sepsis Labs: @LABRCNTIP (procalcitonin:4,lacticidven:4)    Recent Results (from the past 240 hour(s))  MRSA PCR Screening     Status: None   Collection Time: 11/08/16 10:34 AM  Result Value Ref Range Status   MRSA by PCR NEGATIVE NEGATIVE Final      Radiology Studies: No results found.   Scheduled Meds: . enoxaparin (LOVENOX) injection  40 mg Subcutaneous Q24H  . escitalopram  10 mg Oral Daily  . gabapentin  600 mg Oral BID  . LORazepam  1-2 mg Intravenous Q12H  . nicotine  21 mg Transdermal Daily  . thiamine  100 mg Oral Daily   Continuous Infusions: . sodium chloride 100 mL/hr at 11/10/16 0001     LOS: 2 days    Time spent: 25 minutes  Greater than 50% of the time spent on counseling and coordinating the care.   Leisa Lenz, MD [facility] Pager [phone]  If 7PM-7AM, please contact night-coverage [email or web address] Password [email or web address] 11/10/2016, 10:03 AM

## 2016-11-11 MED ORDER — FOLIC ACID 1 MG PO TABS
1.0000 mg | ORAL_TABLET | Freq: Every day | ORAL | Status: DC
  Administered 2016-11-11 – 2016-11-12 (×2): 1 mg via ORAL
  Filled 2016-11-11 (×2): qty 1

## 2016-11-11 NOTE — Clinical Social Work Note (Signed)
Clinical Social Work Assessment  Patient Details  Name: Kathryn Underwood MRN: [id number] Date of Birth: 05-23-1960  Date of referral:  11/11/16               Reason for consult:  Substance Use/ETOH Abuse                Permission sought to share information with:  Case Manager Permission granted to share information::     Name::        Agency::     Relationship::     Contact Information:     Housing/Transportation Living arrangements for the past 2 months:  Single Family Home Source of Information:  Patient Patient Interpreter Needed:  None Criminal Activity/Legal Involvement Pertinent to Current Situation/Hospitalization:  No - Comment as needed Significant Relationships:  Significant Other Lives with:  Significant Other Do you feel safe going back to the place where you live?  Yes Need for family participation in patient care:     Care giving concerns:  There were no care giving concerns at the time of assessment.    Social Worker assessment / plan:  LCSW consulted for substance abuse.   Patient admits to having a problem with alcohol. Patient reports completing residential rehab 6 years ago with [facility]. Patient says she was admitted to the hospital in April of this year, but did not follow up with treatment at discharge. Per patients medical chart she was seen by CSW on 04/26/16 to discuss residential treatment and depression.   PLAN: Patient reports that she is interested in treatment, but does not have insurance. Patient request CSW follow up once she discuss with support system.   LCSW provided resources for residential treatment at [facility].   Employment status:    Insurance information:  Self Pay (Medicaid Pending) PT Recommendations:  Not assessed at this time Information / Referral to community resources:  Residential Substance Abuse Treatment Options, Outpatient Substance Abuse Treatment Options  Patient/Family's Response to care: Patient is receptive to treatment at a  residential treatment facility.   Patient/Family's Understanding of and Emotional Response to Diagnosis, Current Treatment, and Prognosis:  Still assessing  Emotional Assessment Appearance:  Appears stated age Attitude/Demeanor/Rapport:    Affect (typically observed):  Accepting, Calm, Depressed, Pleasant Orientation:  Oriented to Self, Oriented to Place, Oriented to  Time, Oriented to Situation Alcohol / Substance use:  Alcohol Use Psych involvement (Current and /or in the community):     Discharge Needs  Concerns to be addressed:  Substance Abuse Concerns, Mental Health Concerns Readmission within the last 30 days:  No Current discharge risk:  Substance Abuse Barriers to Discharge:  Active Substance Use   Servando Snare, LCSW 11/11/2016, 2:19 PM

## 2016-11-11 NOTE — Progress Notes (Signed)
Patient ID: Kathryn Underwood, female   DOB: December 23, 1960, 56 y.o.   MRN: [id number]  PROGRESS NOTE    MINEOLA DUAN  [id number]:[id number] DOB: Nov 20, 1960 DOA: 11/08/2016  PCP: Ma Hillock, DO   Brief Narrative:  56 year old female with history of polysubstance abuse, alcohol abuse, anxiety and depression who presented to ED for alcohol withdrawal. Apparently she has had a decreased energy level, decreased appetite over past several months. She drinks a 1.5 L of wine on daily basis and her last drink was the morning of the admission.   Assessment & Plan:   Principal Problem:   Acute Alcohol withdrawal ([place]) - We will continue CIWA protocol - Ethanol level on admission less than 10 - Continue thiamine and folic acid - Diet as tolerated   Active Problems:   Tobacco abuse - Counseled on smoking cessation - Continue nicotine patch    Alcoholic hepatitis - AST 85, ALT 71. ALP and total bili are WNL    Depression with anxiety - Continue lexapro   DVT prophylaxis: SCD's Code Status: full code  Family Communication: no family at the bedside Disposition Plan: home in am if she feels better   Consultants:   None  Procedures:   None  Antimicrobials:   None    Subjective: Not feeling well this am, still feels little shaky.  Objective: Vitals:   11/10/16 0557 11/10/16 1512 11/10/16 2126 11/11/16 0420  BP: 115/84 133/74 139/89 121/72  Pulse: (!) 114 (!) 103 (!) 104 95  Resp: 19  20 18   Temp: 98.3 F (36.8 C) 98.5 F (36.9 C) 98.6 F (37 C) 98.6 F (37 C)  TempSrc: Oral Oral Oral Oral  SpO2: 95% 95% 96% 92%  Weight:      Height:        Intake/Output Summary (Last 24 hours) at 11/11/16 0916 Last data filed at 11/11/16 0622  Gross per 24 hour  Intake             2340 ml  Output                0 ml  Net             2340 ml   Filed Weights   11/08/16 0508 11/08/16 0849  Weight: 65.8 kg (145 lb) 65.1 kg (143 lb 8.3 oz)    Examination:  General exam:  Appears calm and comfortable  Respiratory system: Clear to auscultation. Respiratory effort normal. Cardiovascular system: S1 & S2 heard, RRR. No JVD, murmurs, rubs, gallops or clicks. No pedal edema. Gastrointestinal system: Abdomen is nondistended, soft and nontender. No organomegaly or masses felt. Normal bowel sounds heard. Central nervous system: Alert, tremulous Extremities: Symmetric 5 x 5 power. Skin: No rashes, lesions or ulcers Psychiatry: Judgement and insight appear normal. Mood & affect appropriate.   Data Reviewed: I have personally reviewed following labs and imaging studies  CBC:  Recent Labs Lab 11/08/16 0530 11/08/16 1118 11/09/16 0351 11/10/16 0604  WBC 8.4 9.7 7.4 8.3  NEUTROABS 5.5  --   --   --   HGB 15.2* 14.1 13.2 13.1  HCT 44.5 41.8 38.4 39.6  MCV 103.0* 104.8* 105.8* 107.6*  PLT 417* 385 243 299   Basic Metabolic Panel:  Recent Labs Lab 11/08/16 0530 11/08/16 1118 11/09/16 0351 11/10/16 0604  NA 135  --  137 140  K 3.9  --  4.5 3.2*  CL 99*  --  105 107  CO2 24  --  24 24  GLUCOSE 105*  --  105* 93  BUN 11  --  15 10  CREATININE 0.67 0.63 0.59 0.58  CALCIUM 9.5  --  8.6* 8.7*  MG  --  2.1  --   --   PHOS  --  3.5  --   --    GFR: Estimated Creatinine Clearance: 70.4 mL/min (by C-G formula based on SCr of 0.58 mg/dL). Liver Function Tests:  Recent Labs Lab 11/08/16 1118 11/09/16 0351  AST 100* 85*  ALT 95* 71*  ALKPHOS 71 61  BILITOT 0.7 0.8  PROT 6.1* 5.3*  ALBUMIN 3.5 3.0*   No results for input(s): LIPASE, AMYLASE in the last 168 hours. No results for input(s): AMMONIA in the last 168 hours. Coagulation Profile: No results for input(s): INR, PROTIME in the last 168 hours. Cardiac Enzymes: No results for input(s): CKTOTAL, CKMB, CKMBINDEX, TROPONINI in the last 168 hours. BNP (last 3 results) No results for input(s): PROBNP in the last 8760 hours. HbA1C: No results for input(s): HGBA1C in the last 72 hours. CBG: No  results for input(s): GLUCAP in the last 168 hours. Lipid Profile: No results for input(s): CHOL, HDL, LDLCALC, TRIG, CHOLHDL, LDLDIRECT in the last 72 hours. Thyroid Function Tests: No results for input(s): TSH, T4TOTAL, FREET4, T3FREE, THYROIDAB in the last 72 hours. Anemia Panel: No results for input(s): VITAMINB12, FOLATE, FERRITIN, TIBC, IRON, RETICCTPCT in the last 72 hours. Urine analysis:    Component Value Date/Time   COLORURINE YELLOW 11/08/2016 0510   APPEARANCEUR HAZY (A) 11/08/2016 0510   LABSPEC >1.030 (H) 11/08/2016 0510   PHURINE 5.5 11/08/2016 0510   GLUCOSEU NEGATIVE 11/08/2016 0510   HGBUR NEGATIVE 11/08/2016 0510   BILIRUBINUR SMALL (A) 11/08/2016 0510   KETONESUR 15 (A) 11/08/2016 0510   PROTEINUR NEGATIVE 11/08/2016 0510   UROBILINOGEN 0.2 01/23/2010 1750   NITRITE NEGATIVE 11/08/2016 0510   LEUKOCYTESUR NEGATIVE 11/08/2016 0510   Sepsis Labs: @LABRCNTIP (procalcitonin:4,lacticidven:4)   MRSA PCR Screening     Status: None   Collection Time: 11/08/16 10:34 AM  Result Value Ref Range Status   MRSA by PCR NEGATIVE NEGATIVE Final      Radiology Studies: No results found.   Scheduled Meds: . escitalopram  10 mg Oral Daily  . gabapentin  600 mg Oral BID  . LORazepam  1-2 mg Intravenous Q12H  . nicotine  21 mg Transdermal Daily  . thiamine  100 mg Oral Daily   Continuous Infusions: . sodium chloride 100 mL/hr at 11/11/16 0837     LOS: 3 days    Time spent: 25 minutes  Greater than 50% of the time spent on counseling and coordinating the care.   Leisa Lenz, MD [facility] Pager [phone]  If 7PM-7AM, please contact night-coverage [email or web address] Password [email or web address] 11/11/2016, 9:16 AM

## 2016-11-12 DIAGNOSIS — F10232 Alcohol dependence with withdrawal with perceptual disturbance: Secondary | ICD-10-CM

## 2016-11-12 LAB — BASIC METABOLIC PANEL
Anion gap: 9 (ref 5–15)
BUN: 7 mg/dL (ref 6–20)
CALCIUM: 9.1 mg/dL (ref 8.9–10.3)
CHLORIDE: 104 mmol/L (ref 101–111)
CO2: 29 mmol/L (ref 22–32)
CREATININE: 0.56 mg/dL (ref 0.44–1.00)
Glucose, Bld: 103 mg/dL — ABNORMAL HIGH (ref 65–99)
Potassium: 3 mmol/L — ABNORMAL LOW (ref 3.5–5.1)
SODIUM: 142 mmol/L (ref 135–145)

## 2016-11-12 LAB — CBC
HCT: 41.9 % (ref 36.0–46.0)
Hemoglobin: 13.8 g/dL (ref 12.0–15.0)
MCH: 35.3 pg — ABNORMAL HIGH (ref 26.0–34.0)
MCHC: 32.9 g/dL (ref 30.0–36.0)
MCV: 107.2 fL — ABNORMAL HIGH (ref 78.0–100.0)
PLATELETS: 361 10*3/uL (ref 150–400)
RBC: 3.91 MIL/uL (ref 3.87–5.11)
RDW: 13.3 % (ref 11.5–15.5)
WBC: 8.4 10*3/uL (ref 4.0–10.5)

## 2016-11-12 MED ORDER — POTASSIUM CHLORIDE CRYS ER 20 MEQ PO TBCR
40.0000 meq | EXTENDED_RELEASE_TABLET | Freq: Once | ORAL | Status: AC
  Administered 2016-11-12: 40 meq via ORAL
  Filled 2016-11-12: qty 2

## 2016-11-12 MED ORDER — GABAPENTIN 300 MG PO CAPS: 600.0000 mg | ORAL_CAPSULE | Freq: Two times a day (BID) | ORAL | 0 refills | Status: DC

## 2016-11-12 MED ORDER — NICOTINE 21 MG/24HR TD PT24: 21.0000 mg | MEDICATED_PATCH | Freq: Every day | TRANSDERMAL | 0 refills | Status: DC

## 2016-11-12 NOTE — Progress Notes (Signed)
November 12, 2016 Chart and discharge orders researched for Case Management needs. None found and patient discharged to appropriate level of care. Patient and family have no further questions. Velva Harman, BSN, [place], [place].

## 2016-11-12 NOTE — Discharge Summary (Signed)
Physician Discharge Summary  Kathryn Underwood [id number]:[id number] DOB: Apr 08, 1960 DOA: 11/08/2016  PCP: Howard Pouch A, DO  Admit date: 11/08/2016 Discharge date: 11/12/2016  Admitted From: home Disposition:home  Recommendations for Outpatient Follow-up:  1. Follow up with PCP in 1-2 weeks 2. Please obtain cMP/CBC in one week  Home Health: no Equipment/Devices:none  Discharge Condition:stable CODE STATUS:full Diet recommendation: regular  Brief/Interim Summary: 56 year old female with history of polysubstance abuse, alcohol abuse, anxiety and depression who presented to ED for alcohol withdrawal. Apparently she has had a decreased energy level, decreased appetite over past several months. She drinks a 1.5 L of wine on daily basis and her last drink was the morning of the admission.  Discharge Diagnoses:  Principal Problem:   Alcohol withdrawal ([place]) Active Problems:   Tobacco abuse   Depression with anxiety   Transaminitis   MDD (major depressive disorder), recurrent severe, without psychosis ([name])  ACUTE ALCOHOL WITHDRAWAL WITH ALCOHOLIC HEPATITIS  DEPRESSION/ANXIETY  PLAN CSW TO ARRANGE FOR ALCOHOL REHAB SCREENING.PATIENT TO BE DISCHARGED HOME TODAY.  Discharge Instructions   Allergies as of 11/12/2016   No Known Allergies     Medication List    STOP taking these medications   Melatonin 10 MG Tabs     TAKE these medications   bismuth subsalicylate 277 OE/42PN suspension Commonly known as:  PEPTO BISMOL Take 30 mLs by mouth every 4 (four) hours as needed for diarrhea or loose stools.   escitalopram 10 MG tablet Commonly known as:  LEXAPRO Take 10 mg by mouth daily. What changed:  Another medication with the same name was removed. Continue taking this medication, and follow the directions you see here.   famotidine 20 MG tablet Commonly known as:  PEPCID Take 1 tablet (20 mg total) by mouth 2 (two) times daily. Take with NSAID   folic acid 1 MG  tablet Commonly known as:  FOLVITE Take 1 tablet (1 mg total) by mouth daily.   gabapentin 300 MG capsule Commonly known as:  NEURONTIN Take 2 capsules (600 mg total) by mouth 2 (two) times daily. What changed:  how much to take  when to take this  Another medication with the same name was removed. Continue taking this medication, and follow the directions you see here.   multivitamin capsule Take 1 capsule by mouth daily.   nicotine 21 mg/24hr patch Commonly known as:  NICODERM CQ - dosed in mg/24 hours Place 1 patch (21 mg total) onto the skin daily.   thiamine 100 MG tablet Take 1 tablet (100 mg total) by mouth daily.       No Known Allergies  Consultations:NONE  Procedures/Studies:  No results found. (Echo, Carotid, EGD, Colonoscopy, ERCP)    Subjective:   Discharge Exam: Vitals:   11/11/16 1300 11/11/16 2039  BP: (!) 142/90 131/89  Pulse: (!) 107 (!) 106  Resp: 18 18  Temp: 97.9 F (36.6 C) 98.9 F (37.2 C)  SpO2: 97% 92%   Vitals:   11/10/16 2126 11/11/16 0420 11/11/16 1300 11/11/16 2039  BP: 139/89 121/72 (!) 142/90 131/89  Pulse: (!) 104 95 (!) 107 (!) 106  Resp: 20 18 18 18   Temp: 98.6 F (37 C) 98.6 F (37 C) 97.9 F (36.6 C) 98.9 F (37.2 C)  TempSrc: Oral Oral Axillary Oral  SpO2: 96% 92% 97% 92%  Weight:      Height:        General: Pt is alert, awake, not in acute distress Cardiovascular: RRR, S1/S2 +,  no rubs, no gallops Respiratory: CTA bilaterally, no wheezing, no rhonchi Abdominal: Soft, NT, ND, bowel sounds + Extremities: no edema, no cyanosis    The results of significant diagnostics from this hospitalization (including imaging, microbiology, ancillary and laboratory) are listed below for reference.     Microbiology: Recent Results (from the past 240 hour(s))  MRSA PCR Screening     Status: None   Collection Time: 11/08/16 10:34 AM  Result Value Ref Range Status   MRSA by PCR NEGATIVE NEGATIVE Final    Comment:         The GeneXpert MRSA Assay (FDA approved for NASAL specimens only), is one component of a comprehensive MRSA colonization surveillance program. It is not intended to diagnose MRSA infection nor to guide or monitor treatment for MRSA infections.      Labs: BNP (last 3 results)  Recent Labs  04/25/16 2045  BNP 67.8   Basic Metabolic Panel:  Recent Labs Lab 11/08/16 0530 11/08/16 1118 11/09/16 0351 11/10/16 0604 11/12/16 0717  NA 135  --  137 140 142  K 3.9  --  4.5 3.2* 3.0*  CL 99*  --  105 107 104  CO2 24  --  24 24 29   GLUCOSE 105*  --  105* 93 103*  BUN 11  --  15 10 7   CREATININE 0.67 0.63 0.59 0.58 0.56  CALCIUM 9.5  --  8.6* 8.7* 9.1  MG  --  2.1  --   --   --   PHOS  --  3.5  --   --   --    Liver Function Tests:  Recent Labs Lab 11/08/16 1118 11/09/16 0351  AST 100* 85*  ALT 95* 71*  ALKPHOS 71 61  BILITOT 0.7 0.8  PROT 6.1* 5.3*  ALBUMIN 3.5 3.0*   No results for input(s): LIPASE, AMYLASE in the last 168 hours. No results for input(s): AMMONIA in the last 168 hours. CBC:  Recent Labs Lab 11/08/16 0530 11/08/16 1118 11/09/16 0351 11/10/16 0604 11/12/16 0717  WBC 8.4 9.7 7.4 8.3 8.4  NEUTROABS 5.5  --   --   --   --   HGB 15.2* 14.1 13.2 13.1 13.8  HCT 44.5 41.8 38.4 39.6 41.9  MCV 103.0* 104.8* 105.8* 107.6* 107.2*  PLT 417* 385 243 376 361   Cardiac Enzymes: No results for input(s): CKTOTAL, CKMB, CKMBINDEX, TROPONINI in the last 168 hours. BNP: Invalid input(s): POCBNP CBG: No results for input(s): GLUCAP in the last 168 hours. D-Dimer No results for input(s): DDIMER in the last 72 hours. Hgb A1c No results for input(s): HGBA1C in the last 72 hours. Lipid Profile No results for input(s): CHOL, HDL, LDLCALC, TRIG, CHOLHDL, LDLDIRECT in the last 72 hours. Thyroid function studies No results for input(s): TSH, T4TOTAL, T3FREE, THYROIDAB in the last 72 hours.  Invalid input(s): FREET3 Anemia work up No results for  input(s): VITAMINB12, FOLATE, FERRITIN, TIBC, IRON, RETICCTPCT in the last 72 hours. Urinalysis    Component Value Date/Time   COLORURINE YELLOW 11/08/2016 0510   APPEARANCEUR HAZY (A) 11/08/2016 0510   LABSPEC >1.030 (H) 11/08/2016 0510   PHURINE 5.5 11/08/2016 0510   GLUCOSEU NEGATIVE 11/08/2016 0510   HGBUR NEGATIVE 11/08/2016 0510   BILIRUBINUR SMALL (A) 11/08/2016 0510   KETONESUR 15 (A) 11/08/2016 0510   PROTEINUR NEGATIVE 11/08/2016 0510   UROBILINOGEN 0.2 01/23/2010 1750   NITRITE NEGATIVE 11/08/2016 0510   LEUKOCYTESUR NEGATIVE 11/08/2016 0510   Sepsis Labs Invalid input(s):  PROCALCITONIN,  WBC,  LACTICIDVEN Microbiology Recent Results (from the past 240 hour(s))  MRSA PCR Screening     Status: None   Collection Time: 11/08/16 10:34 AM  Result Value Ref Range Status   MRSA by PCR NEGATIVE NEGATIVE Final    Comment:        The GeneXpert MRSA Assay (FDA approved for NASAL specimens only), is one component of a comprehensive MRSA colonization surveillance program. It is not intended to diagnose MRSA infection nor to guide or monitor treatment for MRSA infections.      Time coordinating discharge: Over 30 minutes  SIGNED:   Georgette Shell, MD  [facility] 11/12/2016, 11:36 AM   If 7PM-7AM, please contact night-coverage [email or web address] Password [email or web address]

## 2016-11-12 NOTE — Progress Notes (Addendum)
LCSW followed up with patient regarding substance abuse resources that were provided on 11/11/16.  Patient is open to going to [facility] for treatment.  She is educated regarding program, criteria, and plans. Patient is also given information regarding outpatient  As well.  She is more interested with inpatient treatment and [facility] is closer to home.  She follows at [facility] for her psychiatric medication with Dr. Audrie Gallus, next appointment is 10/25.   Patient will not be able to transfer from hospital to [facility], but will be given a bed screening date in which she will present to [facility].  All information to be placed on her AVS at time of discharge. Patient given instructions regarding next steps with treatment and verbalizes understanding.  SBIRT completed and filed in flowsheets. LCSW spoke fiance Valentina Gu via phone:  [phone] to explain plans for discharge and arranging for residential placement for 11/13/16 at 7:45am.  Cherlynn Kaiser will be providing transportation to residential and will pick up patient this afternoon after 4pm.  He is very appreciative and motivated to  Help patient transition into [facility] residential and get her help.  Lane Hacker, MSW Clinical Social Work: [employer] Coverage for :  [phone]

## 2016-11-13 ENCOUNTER — Telehealth: Payer: Self-pay

## 2016-11-13 NOTE — Telephone Encounter (Signed)
LM requesting call back to schedule hospital f/u appt.  

## 2017-03-02 DIAGNOSIS — F1020 Alcohol dependence, uncomplicated: Secondary | ICD-10-CM | POA: Insufficient documentation

## 2017-10-03 ENCOUNTER — Encounter (HOSPITAL_BASED_OUTPATIENT_CLINIC_OR_DEPARTMENT_OTHER): Payer: Self-pay | Admitting: *Deleted

## 2017-10-03 ENCOUNTER — Inpatient Hospital Stay (HOSPITAL_BASED_OUTPATIENT_CLINIC_OR_DEPARTMENT_OTHER)
Admission: EM | Admit: 2017-10-03 | Discharge: 2017-10-08 | DRG: 896 | Disposition: A | Discharge status: Home / Self Care | Payer: Self-pay | Attending: Internal Medicine | Admitting: Internal Medicine

## 2017-10-03 ENCOUNTER — Other Ambulatory Visit: Payer: Self-pay

## 2017-10-03 DIAGNOSIS — R7401 Elevation of levels of liver transaminase levels: Secondary | ICD-10-CM | POA: Diagnosis present

## 2017-10-03 DIAGNOSIS — E876 Hypokalemia: Secondary | ICD-10-CM | POA: Diagnosis present

## 2017-10-03 DIAGNOSIS — R251 Tremor, unspecified: Secondary | ICD-10-CM | POA: Diagnosis present

## 2017-10-03 DIAGNOSIS — R05 Cough: Secondary | ICD-10-CM | POA: Diagnosis present

## 2017-10-03 DIAGNOSIS — E872 Acidosis: Secondary | ICD-10-CM | POA: Diagnosis present

## 2017-10-03 DIAGNOSIS — E8729 Other acidosis: Secondary | ICD-10-CM | POA: Diagnosis present

## 2017-10-03 DIAGNOSIS — F17210 Nicotine dependence, cigarettes, uncomplicated: Secondary | ICD-10-CM | POA: Diagnosis present

## 2017-10-03 DIAGNOSIS — F1029 Alcohol dependence with unspecified alcohol-induced disorder: Principal | ICD-10-CM | POA: Diagnosis present

## 2017-10-03 DIAGNOSIS — R1013 Epigastric pain: Secondary | ICD-10-CM | POA: Diagnosis present

## 2017-10-03 DIAGNOSIS — R740 Nonspecific elevation of levels of transaminase and lactic acid dehydrogenase [LDH]: Secondary | ICD-10-CM

## 2017-10-03 DIAGNOSIS — R52 Pain, unspecified: Secondary | ICD-10-CM

## 2017-10-03 DIAGNOSIS — F329 Major depressive disorder, single episode, unspecified: Secondary | ICD-10-CM | POA: Diagnosis present

## 2017-10-03 DIAGNOSIS — F10239 Alcohol dependence with withdrawal, unspecified: Secondary | ICD-10-CM | POA: Diagnosis present

## 2017-10-03 DIAGNOSIS — K852 Alcohol induced acute pancreatitis without necrosis or infection: Secondary | ICD-10-CM

## 2017-10-03 DIAGNOSIS — F1410 Cocaine abuse, uncomplicated: Secondary | ICD-10-CM | POA: Diagnosis present

## 2017-10-03 DIAGNOSIS — F1210 Cannabis abuse, uncomplicated: Secondary | ICD-10-CM | POA: Diagnosis present

## 2017-10-03 DIAGNOSIS — Y905 Blood alcohol level of 100-119 mg/100 ml: Secondary | ICD-10-CM | POA: Diagnosis present

## 2017-10-03 DIAGNOSIS — K219 Gastro-esophageal reflux disease without esophagitis: Secondary | ICD-10-CM | POA: Diagnosis present

## 2017-10-03 DIAGNOSIS — K7010 Alcoholic hepatitis without ascites: Secondary | ICD-10-CM | POA: Diagnosis present

## 2017-10-03 DIAGNOSIS — Z79899 Other long term (current) drug therapy: Secondary | ICD-10-CM

## 2017-10-03 DIAGNOSIS — K567 Ileus, unspecified: Secondary | ICD-10-CM | POA: Diagnosis present

## 2017-10-03 DIAGNOSIS — R059 Cough, unspecified: Secondary | ICD-10-CM | POA: Diagnosis present

## 2017-10-03 DIAGNOSIS — R000 Tachycardia, unspecified: Secondary | ICD-10-CM | POA: Diagnosis present

## 2017-10-03 HISTORY — DX: Alcohol abuse, uncomplicated: F10.10

## 2017-10-03 HISTORY — DX: Other psychoactive substance abuse, uncomplicated: F19.10

## 2017-10-03 LAB — URINALYSIS, ROUTINE W REFLEX MICROSCOPIC
GLUCOSE, UA: NEGATIVE mg/dL
Hgb urine dipstick: NEGATIVE
Ketones, ur: 40 mg/dL — AB
LEUKOCYTES UA: NEGATIVE
Nitrite: NEGATIVE
PROTEIN: NEGATIVE mg/dL
Specific Gravity, Urine: >1.03 — ABNORMAL HIGH (ref 1.005–1.030)
pH: 5 (ref 5.0–8.0)

## 2017-10-03 LAB — COMPREHENSIVE METABOLIC PANEL
ALK PHOS: 118 U/L (ref 38–126)
ALT: 170 U/L — ABNORMAL HIGH (ref 0–44)
ANION GAP: 20 — AB (ref 5–15)
AST: 373 U/L — ABNORMAL HIGH (ref 15–41)
Albumin: 4.2 g/dL (ref 3.5–5.0)
BILIRUBIN TOTAL: 1.3 mg/dL — AB (ref 0.3–1.2)
BUN: 14 mg/dL (ref 6–20)
CALCIUM: 9.3 mg/dL (ref 8.9–10.3)
CO2: 21 mmol/L — ABNORMAL LOW (ref 22–32)
Chloride: 93 mmol/L — ABNORMAL LOW (ref 98–111)
Creatinine, Ser: 0.52 mg/dL (ref 0.44–1.00)
GLUCOSE: 115 mg/dL — AB (ref 70–99)
Potassium: 3.5 mmol/L (ref 3.5–5.1)
Sodium: 134 mmol/L — ABNORMAL LOW (ref 135–145)
TOTAL PROTEIN: 7.3 g/dL (ref 6.5–8.1)

## 2017-10-03 LAB — I-STAT VENOUS BLOOD GAS, ED
ACID-BASE DEFICIT: 1 mmol/L (ref 0.0–2.0)
Bicarbonate: 24.2 mmol/L (ref 20.0–28.0)
O2 SAT: 73 %
PCO2 VEN: 38.5 mmHg — AB (ref 44.0–60.0)
PH VEN: 7.404 (ref 7.250–7.430)
PO2 VEN: 37 mmHg (ref 32.0–45.0)
Patient temperature: 98
TCO2: 25 mmol/L (ref 22–32)

## 2017-10-03 LAB — CBC
HCT: 41.2 % (ref 36.0–46.0)
Hemoglobin: 14.3 g/dL (ref 12.0–15.0)
MCH: 35.6 pg — AB (ref 26.0–34.0)
MCHC: 34.7 g/dL (ref 30.0–36.0)
MCV: 102.5 fL — ABNORMAL HIGH (ref 78.0–100.0)
PLATELETS: 302 10*3/uL (ref 150–400)
RBC: 4.02 MIL/uL (ref 3.87–5.11)
RDW: 14.2 % (ref 11.5–15.5)
WBC: 6.6 10*3/uL (ref 4.0–10.5)

## 2017-10-03 LAB — RAPID URINE DRUG SCREEN, HOSP PERFORMED
AMPHETAMINES: NOT DETECTED
Barbiturates: NOT DETECTED
Benzodiazepines: NOT DETECTED
COCAINE: POSITIVE — AB
OPIATES: NOT DETECTED
Tetrahydrocannabinol: POSITIVE — AB

## 2017-10-03 LAB — LIPASE, BLOOD: LIPASE: 68 U/L — AB (ref 11–51)

## 2017-10-03 LAB — I-STAT CG4 LACTIC ACID, ED: LACTIC ACID, VENOUS: 2.19 mmol/L — AB (ref 0.5–1.9)

## 2017-10-03 LAB — ETHANOL: ALCOHOL ETHYL (B): 110 mg/dL — AB (ref <10)

## 2017-10-03 MED ORDER — LORAZEPAM 2 MG/ML IJ SOLN: 1.0000 mg | Freq: Four times a day (QID) | INTRAMUSCULAR | Status: AC | PRN

## 2017-10-03 MED ORDER — NICOTINE 21 MG/24HR TD PT24
21.0000 mg | MEDICATED_PATCH | Freq: Every day | TRANSDERMAL | Status: DC
  Administered 2017-10-03 – 2017-10-08 (×6): 21 mg via TRANSDERMAL
  Filled 2017-10-03 (×6): qty 1

## 2017-10-03 MED ORDER — THIAMINE HCL 100 MG/ML IJ SOLN
100.0000 mg | Freq: Every day | INTRAMUSCULAR | Status: DC
  Administered 2017-10-04: 100 mg via INTRAVENOUS
  Filled 2017-10-03: qty 2

## 2017-10-03 MED ORDER — SODIUM CHLORIDE 0.9 % IV BOLUS
1000.0000 mL | Freq: Once | INTRAVENOUS | Status: AC
  Administered 2017-10-04: 1000 mL via INTRAVENOUS

## 2017-10-03 MED ORDER — LORAZEPAM 2 MG/ML IJ SOLN
1.0000 mg | Freq: Once | INTRAMUSCULAR | Status: AC
  Administered 2017-10-04: 1 mg via INTRAVENOUS
  Filled 2017-10-03: qty 1

## 2017-10-03 MED ORDER — LORAZEPAM 2 MG/ML IJ SOLN: 0.0000 mg | Freq: Two times a day (BID) | INTRAMUSCULAR | Status: DC

## 2017-10-03 MED ORDER — ADULT MULTIVITAMIN W/MINERALS CH
1.0000 | ORAL_TABLET | Freq: Every day | ORAL | Status: DC
  Administered 2017-10-03 – 2017-10-08 (×4): 1 via ORAL
  Filled 2017-10-03 (×5): qty 1

## 2017-10-03 MED ORDER — DEXTROSE-NACL 5-0.45 % IV SOLN
INTRAVENOUS | Status: DC
  Administered 2017-10-04 – 2017-10-06 (×5): via INTRAVENOUS

## 2017-10-03 MED ORDER — VITAMIN B-1 100 MG PO TABS
100.0000 mg | ORAL_TABLET | Freq: Every day | ORAL | Status: DC
  Administered 2017-10-03 – 2017-10-08 (×5): 100 mg via ORAL
  Filled 2017-10-03 (×5): qty 1

## 2017-10-03 MED ORDER — LORAZEPAM 1 MG PO TABS
1.0000 mg | ORAL_TABLET | Freq: Four times a day (QID) | ORAL | Status: AC | PRN
  Administered 2017-10-05 – 2017-10-06 (×2): 1 mg via ORAL
  Filled 2017-10-03 (×2): qty 1

## 2017-10-03 MED ORDER — SODIUM CHLORIDE 0.9 % IV BOLUS
1000.0000 mL | Freq: Once | INTRAVENOUS | Status: AC
  Administered 2017-10-03: 1000 mL via INTRAVENOUS

## 2017-10-03 MED ORDER — FOLIC ACID 1 MG PO TABS
1.0000 mg | ORAL_TABLET | Freq: Every day | ORAL | Status: DC
  Administered 2017-10-03 – 2017-10-08 (×5): 1 mg via ORAL
  Filled 2017-10-03 (×5): qty 1

## 2017-10-03 MED ORDER — FAMOTIDINE IN NACL 20-0.9 MG/50ML-% IV SOLN
20.0000 mg | Freq: Once | INTRAVENOUS | Status: AC
  Administered 2017-10-03: 20 mg via INTRAVENOUS
  Filled 2017-10-03: qty 50

## 2017-10-03 MED ORDER — LORAZEPAM 2 MG/ML IJ SOLN: 0.0000 mg | Freq: Four times a day (QID) | INTRAMUSCULAR | Status: DC

## 2017-10-03 NOTE — ED Notes (Signed)
Pt condition and VS reported to Dr. Darl Householder

## 2017-10-03 NOTE — ED Provider Notes (Signed)
[facility]PARTMENT Provider Note   CSN: [id number] Arrival date & time: 10/03/17  2122     History   Chief Complaint Chief Complaint  Patient presents with  . Alcohol Problem    requesting detox    HPI Kathryn Underwood is a 57 y.o. female history of depression, chronic alcoholic here presenting with requesting alcohol detox, tremors.  Patient states that she drinks a bottle of wine every 12 hours.  She states that she has been progressively depressed but denies any suicidal ideation.  Over the last 2 to 3 days, she has increased reflux symptoms and felt nauseated.  He felt like she was also having some tremors as well.  She denies any visual or auditory hallucinations.  She was admitted for alcohol withdrawal about a year ago and last alcohol use was just prior to arrival.  The history is provided by the patient.    Past Medical History:  Diagnosis Date  . Alcohol abuse   . Anxiety   . Chickenpox   . Depression   . Neuromuscular disorder (HCC)    neuropathy  . Substance abuse [facility])     Patient Active Problem List   Diagnosis Date Noted  . MDD (major depressive disorder), recurrent severe, without psychosis ([place]) 04/28/2016  . Alcohol withdrawal ([place]) 04/25/2016  . Tachycardia 04/25/2016  . Transaminitis 04/25/2016  . Leukocytosis 04/25/2016  . Closed nondisplaced fracture of fifth right metatarsal bone 04/08/2016  . Acute pain of right shoulder 04/08/2016  . Vitamin D deficiency 12/11/2015  . Alcohol dependence with unspecified alcohol-induced disorder ([place]) 12/09/2015  . Fatigue 12/09/2015  . Encounter for alcohol abuse counseling and surveillance 12/09/2015  . Tobacco abuse 01/04/2015  . Depression with anxiety 01/04/2015    Past Surgical History:  Procedure Laterality Date  . CESAREAN SECTION    . TONSILLECTOMY       OB History   None      Home Medications    Prior to Admission medications   Medication Sig Start Date End Date  Taking? Authorizing Provider  escitalopram (LEXAPRO) 10 MG tablet Take 20 mg by mouth daily.  09/18/16  Yes [provider]  folic acid (FOLVITE) 1 MG tablet Take 1 tablet (1 mg total) by mouth daily. 04/29/16  Yes Sheikh, Omair Latif, DO  gabapentin (NEURONTIN) 300 MG capsule Take 2 capsules (600 mg total) by mouth 2 (two) times daily. 11/12/16  Yes Georgette Shell, MD  bismuth subsalicylate (PEPTO BISMOL) 262 MG/15ML suspension Take 30 mLs by mouth every 4 (four) hours as needed for diarrhea or loose stools.    [provider]  famotidine (PEPCID) 20 MG tablet Take 1 tablet (20 mg total) by mouth 2 (two) times daily. Take with NSAID Patient not taking: Reported on 11/08/2016 04/08/16   Gerda Diss, DO  Multiple Vitamin (MULTIVITAMIN) capsule Take 1 capsule by mouth daily.    [provider]  nicotine (NICODERM CQ - DOSED IN MG/24 HOURS) 21 mg/24hr patch Place 1 patch (21 mg total) onto the skin daily. 11/13/16   Georgette Shell, MD  thiamine 100 MG tablet Take 1 tablet (100 mg total) by mouth daily. Patient not taking: Reported on 11/08/2016 04/29/16   Kerney Elbe, DO    Family History Family History  Problem Relation Age of Onset  . Dementia Mother   . Arthritis Father   . Asthma Father   . Asthma Brother   . Diabetes Maternal Grandmother   .  Dementia Maternal Grandmother   . Diabetes Paternal Grandmother   . Parkinson's disease Maternal Grandfather   . Parkinson's disease Paternal Grandfather   . Breast cancer Neg Hx   . Colon cancer Neg Hx     Social History Social History   Tobacco Use  . Smoking status: Current Every Day Smoker    Packs/day: 0.50    Types: Cigarettes  . Smokeless tobacco: Never Used  Substance Use Topics  . Alcohol use: Yes    Alcohol/week: 8.0 standard drinks    Types: 8 Glasses of wine per week    Comment: daily (1.5 liter bottle wine every 12)  . Drug use: Yes    Types: Cocaine, Marijuana      Allergies   Patient has no known allergies.   Review of Systems Review of Systems  Psychiatric/Behavioral: Positive for dysphoric mood.  All other systems reviewed and are negative.    Physical Exam Updated Vital Signs BP 127/88 (BP Location: Right Arm)   Pulse 98   Temp 98.6 F (37 C) (Oral)   Resp 16   Ht 5\' 3"  (1.6 m)   Wt 63.5 kg   SpO2 98%   BMI 24.80 kg/m   Physical Exam  Constitutional: She is oriented to person, place, and time.  Dehydrated. Chronically ill. Smells of alcohol   HENT:  Head: Normocephalic.  MM dry   Eyes: Pupils are equal, round, and reactive to light. Conjunctivae and EOM are normal.  Neck: Normal range of motion. Neck supple.  Cardiovascular: Regular rhythm.  Tachycardic   Pulmonary/Chest: Effort normal and breath sounds normal. No stridor. No respiratory distress.  Abdominal: Soft. Bowel sounds are normal. She exhibits no distension. There is no tenderness.  Musculoskeletal: Normal range of motion.  Neurological: She is alert and oriented to person, place, and time. No cranial nerve deficit. Coordination normal.  + tremors. No asterixis.   Skin: Skin is warm.  Psychiatric: She has a normal mood and affect.  Nursing note and vitals reviewed.    ED Treatments / Results  Labs (all labs ordered are listed, but only abnormal results are displayed) Labs Reviewed  COMPREHENSIVE METABOLIC PANEL - Abnormal; Notable for the following components:      Result Value   Sodium 134 (*)    Chloride 93 (*)    CO2 21 (*)    Glucose, Bld 115 (*)    AST 373 (*)    ALT 170 (*)    Total Bilirubin 1.3 (*)    Anion gap 20 (*)    All other components within normal limits  ETHANOL - Abnormal; Notable for the following components:   Alcohol, Ethyl (B) 110 (*)    All other components within normal limits  CBC - Abnormal; Notable for the following components:   MCV 102.5 (*)    MCH 35.6 (*)    All other components within normal limits  RAPID  URINE DRUG SCREEN, HOSP PERFORMED - Abnormal; Notable for the following components:   Cocaine POSITIVE (*)    Tetrahydrocannabinol POSITIVE (*)    All other components within normal limits  LIPASE, BLOOD - Abnormal; Notable for the following components:   Lipase 68 (*)    All other components within normal limits  URINALYSIS, ROUTINE W REFLEX MICROSCOPIC  BLOOD GAS, VENOUS  I-STAT CG4 LACTIC ACID, ED    EKG None  Radiology No results found.  Procedures Procedures (including critical care time)  Medications Ordered in ED Medications  LORazepam (ATIVAN)  tablet 1 mg (has no administration in time range)    Or  LORazepam (ATIVAN) injection 1 mg (has no administration in time range)  thiamine (VITAMIN B-1) tablet 100 mg (100 mg Oral Given 10/03/17 2219)    Or  thiamine (B-1) injection 100 mg ( Intravenous See Alternative 0/3/83 3383)  folic acid (FOLVITE) tablet 1 mg (1 mg Oral Given 10/03/17 2220)  multivitamin with minerals tablet 1 tablet (1 tablet Oral Given 10/03/17 2219)  LORazepam (ATIVAN) injection 0-4 mg (0 mg Intravenous Not Given 10/03/17 2227)    Followed by  LORazepam (ATIVAN) injection 0-4 mg (has no administration in time range)  nicotine (NICODERM CQ - dosed in mg/24 hours) patch 21 mg (21 mg Transdermal Patch Applied 10/03/17 2244)  LORazepam (ATIVAN) injection 1 mg (0 mg Intravenous Hold 10/03/17 2308)  sodium chloride 0.9 % bolus 1,000 mL (has no administration in time range)  dextrose 5 %-0.45 % sodium chloride infusion ( Intravenous Hold 10/03/17 2339)  sodium chloride 0.9 % bolus 1,000 mL (1,000 mLs Intravenous New Bag/Given 10/03/17 2222)  famotidine (PEPCID) IVPB 20 mg premix (0 mg Intravenous Stopped 10/03/17 2253)     Initial Impression / Assessment and Plan / ED Course  I have reviewed the triage vital signs and the nursing notes.  Pertinent labs & imaging results that were available during my care of the patient were reviewed by me and considered in my medical  decision making (see chart for details).     Kathryn Underwood is a 57 y.o. female here with requesting alcohol detox. Patient is tachycardic on arrival but drank alcohol just prior to arrival. Consider alcohol withdrawal vs dehydration vs gastritis. Will get labs, ETOH. Will hydrate and reassess.   11: 30 pm ETOH was 110. LFTs elevated consistent with alcohol use. No RUQ tenderness. Her CIWA was 2. Chemistry showed AG 20 and bicarb 21. I think likely alcohol ketoacidosis vs dehydration. I talked to Dr. Alcario Drought from [facility] regarding admission. He requests UA, lactate, VBG, hydration first.   11:53 PM Lactate, VBG pending. Signed out to Dr. Dina Rich. Anticipate admission.    Final Clinical Impressions(s) / ED Diagnoses   Final diagnoses:  None    ED Discharge Orders    None       Drenda Freeze, MD 10/03/17 2353

## 2017-10-03 NOTE — ED Triage Notes (Signed)
Pt requesting detox from etoh. States she drinks "a big bottle of wine every day". Last drink today was just prior to arrival. Denies SI

## 2017-10-04 ENCOUNTER — Observation Stay (HOSPITAL_COMMUNITY): Payer: Self-pay

## 2017-10-04 DIAGNOSIS — E8729 Other acidosis: Secondary | ICD-10-CM | POA: Diagnosis present

## 2017-10-04 DIAGNOSIS — R1013 Epigastric pain: Secondary | ICD-10-CM | POA: Diagnosis present

## 2017-10-04 DIAGNOSIS — E872 Acidosis: Secondary | ICD-10-CM | POA: Diagnosis present

## 2017-10-04 DIAGNOSIS — R05 Cough: Secondary | ICD-10-CM

## 2017-10-04 DIAGNOSIS — R740 Nonspecific elevation of levels of transaminase and lactic acid dehydrogenase [LDH]: Secondary | ICD-10-CM

## 2017-10-04 LAB — COMPREHENSIVE METABOLIC PANEL
ALT: 135 U/L — ABNORMAL HIGH (ref 0–44)
AST: 254 U/L — ABNORMAL HIGH (ref 15–41)
Albumin: 3.8 g/dL (ref 3.5–5.0)
Alkaline Phosphatase: 93 U/L (ref 38–126)
Anion gap: 12 (ref 5–15)
BUN: 13 mg/dL (ref 6–20)
CHLORIDE: 101 mmol/L (ref 98–111)
CO2: 25 mmol/L (ref 22–32)
Calcium: 8.8 mg/dL — ABNORMAL LOW (ref 8.9–10.3)
Creatinine, Ser: 0.58 mg/dL (ref 0.44–1.00)
Glucose, Bld: 159 mg/dL — ABNORMAL HIGH (ref 70–99)
POTASSIUM: 3.8 mmol/L (ref 3.5–5.1)
Sodium: 138 mmol/L (ref 135–145)
Total Bilirubin: 1.8 mg/dL — ABNORMAL HIGH (ref 0.3–1.2)
Total Protein: 6.2 g/dL — ABNORMAL LOW (ref 6.5–8.1)

## 2017-10-04 LAB — CBC
HEMATOCRIT: 39.6 % (ref 36.0–46.0)
HEMOGLOBIN: 13 g/dL (ref 12.0–15.0)
MCH: 34.4 pg — ABNORMAL HIGH (ref 26.0–34.0)
MCHC: 32.8 g/dL (ref 30.0–36.0)
MCV: 104.8 fL — AB (ref 78.0–100.0)
Platelets: 312 10*3/uL (ref 150–400)
RBC: 3.78 MIL/uL — AB (ref 3.87–5.11)
RDW: 14.8 % (ref 11.5–15.5)
WBC: 9.2 10*3/uL (ref 4.0–10.5)

## 2017-10-04 LAB — LIPASE, BLOOD: LIPASE: 914 U/L — AB (ref 11–51)

## 2017-10-04 LAB — PROTIME-INR
INR: 0.95
PROTHROMBIN TIME: 12.6 s (ref 11.4–15.2)

## 2017-10-04 LAB — TROPONIN I: Troponin I: <0.03 ng/mL (ref <0.03)

## 2017-10-04 LAB — I-STAT CG4 LACTIC ACID, ED: LACTIC ACID, VENOUS: 1.62 mmol/L (ref 0.5–1.9)

## 2017-10-04 MED ORDER — ONDANSETRON 4 MG PO TBDP
4.0000 mg | ORAL_TABLET | Freq: Three times a day (TID) | ORAL | Status: DC | PRN
  Administered 2017-10-05: 4 mg via ORAL
  Filled 2017-10-04: qty 1

## 2017-10-04 MED ORDER — LACTATED RINGERS IV BOLUS
1000.0000 mL | Freq: Once | INTRAVENOUS | Status: AC
  Administered 2017-10-04: 1000 mL via INTRAVENOUS

## 2017-10-04 MED ORDER — MORPHINE SULFATE (PF) 2 MG/ML IV SOLN
2.0000 mg | INTRAVENOUS | Status: DC | PRN
  Administered 2017-10-04 – 2017-10-05 (×5): 2 mg via INTRAVENOUS
  Filled 2017-10-04 (×5): qty 1

## 2017-10-04 MED ORDER — BISMUTH SUBSALICYLATE 262 MG/15ML PO SUSP
30.0000 mL | ORAL | Status: DC | PRN
  Administered 2017-10-04: 30 mL via ORAL
  Filled 2017-10-04: qty 236

## 2017-10-04 MED ORDER — FAMOTIDINE IN NACL 20-0.9 MG/50ML-% IV SOLN
20.0000 mg | Freq: Two times a day (BID) | INTRAVENOUS | Status: DC
  Administered 2017-10-04 – 2017-10-08 (×9): 20 mg via INTRAVENOUS
  Filled 2017-10-04 (×9): qty 50

## 2017-10-04 MED ORDER — ESCITALOPRAM OXALATE 20 MG PO TABS
20.0000 mg | ORAL_TABLET | Freq: Every day | ORAL | Status: DC
  Administered 2017-10-04 – 2017-10-08 (×5): 20 mg via ORAL
  Filled 2017-10-04 (×5): qty 1

## 2017-10-04 MED ORDER — GI COCKTAIL ~~LOC~~
30.0000 mL | Freq: Three times a day (TID) | ORAL | Status: DC | PRN
  Administered 2017-10-04 – 2017-10-07 (×3): 30 mL via ORAL
  Filled 2017-10-04 (×3): qty 30

## 2017-10-04 MED ORDER — LORAZEPAM 2 MG/ML IJ SOLN
0.0000 mg | Freq: Two times a day (BID) | INTRAMUSCULAR | Status: AC
  Administered 2017-10-06: 1 mg via INTRAVENOUS
  Administered 2017-10-06: 2 mg via INTRAVENOUS
  Administered 2017-10-07 (×2): 1 mg via INTRAVENOUS
  Filled 2017-10-04 (×4): qty 1

## 2017-10-04 MED ORDER — LORAZEPAM 2 MG/ML IJ SOLN
0.0000 mg | Freq: Four times a day (QID) | INTRAMUSCULAR | Status: AC
  Administered 2017-10-04 – 2017-10-05 (×2): 1 mg via INTRAVENOUS
  Administered 2017-10-05 (×3): 2 mg via INTRAVENOUS
  Filled 2017-10-04 (×5): qty 1

## 2017-10-04 MED ORDER — GABAPENTIN 300 MG PO CAPS
600.0000 mg | ORAL_CAPSULE | Freq: Two times a day (BID) | ORAL | Status: DC
  Administered 2017-10-04 – 2017-10-08 (×10): 600 mg via ORAL
  Filled 2017-10-04 (×10): qty 2

## 2017-10-04 MED ORDER — IBUPROFEN 200 MG PO TABS
400.0000 mg | ORAL_TABLET | Freq: Four times a day (QID) | ORAL | Status: DC | PRN
  Administered 2017-10-04: 400 mg via ORAL
  Filled 2017-10-04: qty 2

## 2017-10-04 MED ORDER — ENOXAPARIN SODIUM 40 MG/0.4ML ~~LOC~~ SOLN
40.0000 mg | SUBCUTANEOUS | Status: DC
  Administered 2017-10-04 – 2017-10-06 (×3): 40 mg via SUBCUTANEOUS
  Filled 2017-10-04 (×4): qty 0.4

## 2017-10-04 NOTE — Plan of Care (Signed)
Patient presenting to [facility] ED requesting detox.  Still intoxicated with BGL of 110.  AG 20 with ketones in urine.  EDP pushing for admission.  Will put in for med-surg obs.

## 2017-10-04 NOTE — ED Notes (Signed)
Date and time results received: 10/03/17 (use smartphrase ".now" to insert current time)  Test: ISTAT Lactic Acid Critical Value: 2.19  Name of Provider Notified: Dr. Shirlyn Goltz  Orders Received? Or Actions Taken?: Notified physician

## 2017-10-04 NOTE — Progress Notes (Signed)
Alger Memos, NP made aware of 14 beat run of vtach. Will make dayshift RN aware as well. Carnella [place] I

## 2017-10-04 NOTE — Progress Notes (Signed)
PROGRESS NOTE    Kathryn Underwood  VEL:[id number] DOB: 13-Aug-1960 DOA: 10/03/2017 PCP: Ma Hillock, DO   Brief Narrative: Kathryn Underwood is a 57 y.o. female with medical history significant of EtOH abuse, cocaine and THC abuse. She presented with nausea and requesting detox. She was found to have acute pancreatitis.   Assessment & Plan:   Principal Problem:   Alcoholic ketoacidosis Active Problems:   Cough   Alcohol dependence with unspecified alcohol-induced disorder (HCC)   Transaminitis   Epigastric pain   Alcohol ketoacidosis Improved today with IV fluids.  Acute pancreatitis Alcohol induced. Nausea with an episode of vomiting this morning -Will try a clear liquid diet this morning -Zofran prn -Morphine IV prn  Alcohol abuse -CIWA -CSW consult  Alcoholic hepatitis Acute elevations of AST/ALT. Currently trending down. Bilirubin elevated.   DVT prophylaxis: Lovenox Code Status:   Code Status: Prior Family Communication: None at bedside Disposition Plan: Discharge when able to tolerate oral intake consistently   Consultants:   None  Procedures:   None  Antimicrobials:  None    Subjective: Abdominal pain. Vomited this morning.  Objective: Vitals:   10/04/17 0120 10/04/17 0218 10/04/17 0226 10/04/17 1150  BP: 117/84  111/72 111/72  Pulse: (!) 107 99 87 (!) 106  Resp: 18 20  20   Temp:  98 F (36.7 C)  98.3 F (36.8 C)  TempSrc:  Oral  Oral  SpO2: 96% 93%  93%  Weight:      Height:        Intake/Output Summary (Last 24 hours) at 10/04/2017 1339 Last data filed at 10/04/2017 1000 Gross per 24 hour  Intake 2251.02 ml  Output -  Net 2251.02 ml   Filed Weights   10/03/17 2131  Weight: 63.5 kg    Examination:  General exam: Appears calm and comfortable Respiratory system: Clear to auscultation. Respiratory effort normal. Cardiovascular system: S1 & S2 heard, RRR. Gastrointestinal system: Abdomen is nondistended, soft and tender in  epigastrum. No organomegaly or masses felt. Normal bowel sounds heard. Central nervous system: Alert and oriented. No focal neurological deficits. Extremities: No edema. No calf tenderness Skin: No cyanosis. No rashes Psychiatry: Judgement and insight appear normal. Flat affect    Data Reviewed: I have personally reviewed following labs and imaging studies  CBC: Recent Labs  Lab 10/03/17 2213 10/04/17 0459  WBC 6.6 9.2  HGB 14.3 13.0  HCT 41.2 39.6  MCV 102.5* 104.8*  PLT 302 258   Basic Metabolic Panel: Recent Labs  Lab 10/03/17 2213 10/04/17 0459  NA 134* 138  K 3.5 3.8  CL 93* 101  CO2 21* 25  GLUCOSE 115* 159*  BUN 14 13  CREATININE 0.52 0.58  CALCIUM 9.3 8.8*   GFR: Estimated Creatinine Clearance: 68.7 mL/min (by C-G formula based on SCr of 0.58 mg/dL). Liver Function Tests: Recent Labs  Lab 10/03/17 2213 10/04/17 0459  AST 373* 254*  ALT 170* 135*  ALKPHOS 118 93  BILITOT 1.3* 1.8*  PROT 7.3 6.2*  ALBUMIN 4.2 3.8   Recent Labs  Lab 10/03/17 2213 10/04/17 0459  LIPASE 68* 914*   No results for input(s): AMMONIA in the last 168 hours. Coagulation Profile: No results for input(s): INR, PROTIME in the last 168 hours. Cardiac Enzymes: Recent Labs  Lab 10/04/17 0459  TROPONINI <0.03   BNP (last 3 results) No results for input(s): PROBNP in the last 8760 hours. HbA1C: No results for input(s): HGBA1C in the last 72 hours.  CBG: No results for input(s): GLUCAP in the last 168 hours. Lipid Profile: No results for input(s): CHOL, HDL, LDLCALC, TRIG, CHOLHDL, LDLDIRECT in the last 72 hours. Thyroid Function Tests: No results for input(s): TSH, T4TOTAL, FREET4, T3FREE, THYROIDAB in the last 72 hours. Anemia Panel: No results for input(s): VITAMINB12, FOLATE, FERRITIN, TIBC, IRON, RETICCTPCT in the last 72 hours. Sepsis Labs: Recent Labs  Lab 10/03/17 2356 10/04/17 0133  LATICACIDVEN 2.19* 1.62    No results found for this or any previous  visit (from the past 240 hour(s)).       Radiology Studies: Dg Chest 2 View  Result Date: 10/04/2017 CLINICAL DATA:  Cough. EXAM: CHEST - 2 VIEW COMPARISON:  03/26/2016 FINDINGS: The cardiomediastinal contours are normal. Minimal central bronchitic change. Pulmonary vasculature is normal. No consolidation, pleural effusion, or pneumothorax. No acute osseous abnormalities are seen. IMPRESSION: Minimal chronic central bronchitic change.  No acute findings. Electronically Signed   By: Keith Rake M.D.   On: 10/04/2017 04:00        Scheduled Meds: . enoxaparin (LOVENOX) injection  40 mg Subcutaneous Q24H  . escitalopram  20 mg Oral Daily  . folic acid  1 mg Oral Daily  . gabapentin  600 mg Oral BID  . LORazepam  0-4 mg Intravenous Q6H   Followed by  . [START ON 10/06/2017] LORazepam  0-4 mg Intravenous Q12H  . multivitamin with minerals  1 tablet Oral Daily  . nicotine  21 mg Transdermal Daily  . thiamine  100 mg Oral Daily   Or  . thiamine  100 mg Intravenous Daily   Continuous Infusions: . dextrose 5 % and 0.45% NaCl 100 mL/hr at 10/04/17 1029  . famotidine (PEPCID) IV 20 mg (10/04/17 1042)     LOS: 0 days     Cordelia Poche, MD [facility] 10/04/2017, 1:39 PM Pager: [phone]  If 7PM-7AM, please contact night-coverage [email or web address] 10/04/2017, 1:39 PM

## 2017-10-04 NOTE — H&P (Addendum)
History and Physical    Kathryn Underwood OJJ:[id number] DOB: [birth year] DOA: 10/03/2017  PCP: Ma Hillock, DO  Patient coming from: Home  I have personally briefly reviewed patient's old medical records in [place]  Chief Complaint: EtOH abuse  HPI: Kathryn Underwood is a 57 y.o. female with medical history significant of EtOH abuse, cocaine and THC abuse.  Patient typically drinks 1 bottle of wine every 12h.  Patient presents to ED at [facility] with c/o increased reflux symptoms and feelings of nausea over the past 2-3 days.  Also feels like she is having tremors and requests detox from EDP.  ED Course: On work up in ED she is also noted to have transaminitis in a pattern suspicious for EtOH hepatitis.  Lipase 63.  Anion gap metabolic acidosis with keytones in urine.  EDP requests admit for alcoholic ketoacidosis.  Patient reports onset of Abd pain since they gave her "vitamins on an empty stomach" in the ED.   Review of Systems: As per HPI otherwise 10 point review of systems negative.   Past Medical History:  Diagnosis Date  . Alcohol abuse   . Anxiety   . Chickenpox   . Depression   . Neuromuscular disorder (HCC)    neuropathy  . Substance abuse [facility])     Past Surgical History:  Procedure Laterality Date  . CESAREAN SECTION    . TONSILLECTOMY       reports that she has been smoking cigarettes. She has been smoking about 0.50 packs per day. She has never used smokeless tobacco. She reports that she drinks about 8.0 standard drinks of alcohol per week. She reports that she has current or past drug history. Drugs: Cocaine and Marijuana.  No Known Allergies  Family History  Problem Relation Age of Onset  . Dementia Mother   . Arthritis Father   . Asthma Father   . Asthma Brother   . Diabetes Maternal Grandmother   . Dementia Maternal Grandmother   . Diabetes Paternal Grandmother   . Parkinson's disease Maternal Grandfather   . Parkinson's disease Paternal  Grandfather   . Breast cancer Neg Hx   . Colon cancer Neg Hx      Prior to Admission medications   Medication Sig Start Date End Date Taking? Authorizing Provider  bismuth subsalicylate (PEPTO BISMOL) 262 MG/15ML suspension Take 30 mLs by mouth every 4 (four) hours as needed for diarrhea or loose stools.   Yes [provider]  escitalopram (LEXAPRO) 10 MG tablet Take 20 mg by mouth daily.  09/18/16  Yes [provider]  folic acid (FOLVITE) 1 MG tablet Take 1 tablet (1 mg total) by mouth daily. 04/29/16  Yes Sheikh, Omair Latif, DO  gabapentin (NEURONTIN) 300 MG capsule Take 2 capsules (600 mg total) by mouth 2 (two) times daily. 11/12/16  Yes Georgette Shell, MD  ibuprofen (ADVIL,MOTRIN) 200 MG tablet Take 400 mg by mouth every 6 (six) hours as needed for moderate pain.   Yes [provider]  MELATONIN PO Take 1-2 tablets by mouth at bedtime as needed (sleep).   Yes [provider]    Physical Exam: Vitals:   10/04/17 0023 10/04/17 0120 10/04/17 0218 10/04/17 0226  BP: 125/76 117/84  111/72  Pulse: 100 (!) 107 99 87  Resp: 16 18 20    Temp:   98 F (36.7 C)   TempSrc:   Oral   SpO2: 93% 96% 93%   Weight:  Height:        Constitutional: NAD, calm, comfortable Eyes: PERRL, lids and conjunctivae normal ENMT: Mucous membranes are moist. Posterior pharynx clear of any exudate or lesions.Normal dentition.  Neck: normal, supple, no masses, no thyromegaly Respiratory: clear to auscultation bilaterally, no wheezing, no crackles. Normal respiratory effort. No accessory muscle use.  Cardiovascular: Regular rate and rhythm, no murmurs / rubs / gallops. No extremity edema. 2+ pedal pulses. No carotid bruits.  Abdomen: Epigastric TTP Musculoskeletal: no clubbing / cyanosis. No joint deformity upper and lower extremities. Good ROM, no contractures. Normal muscle tone.  Skin: no rashes, lesions, ulcers. No induration Neurologic: CN 2-12 grossly intact.  Sensation intact, DTR normal. Strength 5/5 in all 4.  Psychiatric: Normal judgment and insight. Alert and oriented x 3. Normal mood.    Labs on Admission: I have personally reviewed following labs and imaging studies  CBC: Recent Labs  Lab 10/03/17 2213  WBC 6.6  HGB 14.3  HCT 41.2  MCV 102.5*  PLT 161   Basic Metabolic Panel: Recent Labs  Lab 10/03/17 2213  NA 134*  K 3.5  CL 93*  CO2 21*  GLUCOSE 115*  BUN 14  CREATININE 0.52  CALCIUM 9.3   GFR: Estimated Creatinine Clearance: 68.7 mL/min (by C-G formula based on SCr of 0.52 mg/dL). Liver Function Tests: Recent Labs  Lab 10/03/17 2213  AST 373*  ALT 170*  ALKPHOS 118  BILITOT 1.3*  PROT 7.3  ALBUMIN 4.2   Recent Labs  Lab 10/03/17 2213  LIPASE 68*   No results for input(s): AMMONIA in the last 168 hours. Coagulation Profile: No results for input(s): INR, PROTIME in the last 168 hours. Cardiac Enzymes: No results for input(s): CKTOTAL, CKMB, CKMBINDEX, TROPONINI in the last 168 hours. BNP (last 3 results) No results for input(s): PROBNP in the last 8760 hours. HbA1C: No results for input(s): HGBA1C in the last 72 hours. CBG: No results for input(s): GLUCAP in the last 168 hours. Lipid Profile: No results for input(s): CHOL, HDL, LDLCALC, TRIG, CHOLHDL, LDLDIRECT in the last 72 hours. Thyroid Function Tests: No results for input(s): TSH, T4TOTAL, FREET4, T3FREE, THYROIDAB in the last 72 hours. Anemia Panel: No results for input(s): VITAMINB12, FOLATE, FERRITIN, TIBC, IRON, RETICCTPCT in the last 72 hours. Urine analysis:    Component Value Date/Time   COLORURINE AMBER (A) 10/03/2017 2213   APPEARANCEUR CLEAR 10/03/2017 2213   LABSPEC >1.030 (H) 10/03/2017 2213   PHURINE 5.0 10/03/2017 2213   GLUCOSEU NEGATIVE 10/03/2017 2213   HGBUR NEGATIVE 10/03/2017 2213   BILIRUBINUR SMALL (A) 10/03/2017 2213   KETONESUR 40 (A) 10/03/2017 2213   PROTEINUR NEGATIVE 10/03/2017 2213   UROBILINOGEN 0.2  01/23/2010 1750   NITRITE NEGATIVE 10/03/2017 2213   LEUKOCYTESUR NEGATIVE 10/03/2017 2213    Radiological Exams on Admission: No results found.  EKG: Independently reviewed.  Assessment/Plan Principal Problem:   Alcoholic ketoacidosis Active Problems:   Cough   Alcohol dependence with unspecified alcohol-induced disorder (HCC)   Transaminitis   Epigastric pain    1. Alcoholic ketoacidosis - 1. IVF: D5 half at 100 2. Repeat CMP in AM 2. Epigastric pain - 1. GERD vs alcoholic hepatitis vs pancreatitis vs stomach cramps due to ketoacidosis 2. Pt requesting peptobismol by name, so will go ahead and give this now. 3. PRN GI cocktails ordered as next line 4. Continue IV pepcid which it looks like they gave one dose of in ED 5. Repeat CMP in AM 6. Repeat lipase in  AM 7. In abundance of caution, will order serial trops, EKG, and put on tele monitor, just in case. 1. Probably just acid reflux, but I do see the cocaine on UDS. 3. Cough - CXR ordered 4. EtOH dependence - 1. CIWA pathway  DVT prophylaxis: Lovenox Code Status: Full Family Communication: No family in room Disposition Plan: Home after admit Consults called: None Admission status: Place in obs   Jelene Albano, [facility] Pager [phone] Only works nights!  If 7AM-7PM, please contact the primary day team physician taking care of patient  [email or web address] Password [email or web address]  10/04/2017, 2:54 AM

## 2017-10-04 NOTE — Progress Notes (Signed)
CSW consulted for current substance abuse "alcohol". CSW attempted to speak with patient at bedside regarding consult. Patient declined to speak with CSW and reported that she may go to [facility] if she gets the money together. CSW aske patient if CSW assistance was needed, patient declined. Patient declined CSW intervention, CSW signing off.   Abundio Miu, [place] Social Worker [facility] Cell#: [phone]

## 2017-10-05 DIAGNOSIS — K852 Alcohol induced acute pancreatitis without necrosis or infection: Secondary | ICD-10-CM

## 2017-10-05 MED ORDER — METOPROLOL TARTRATE 5 MG/5ML IV SOLN
5.0000 mg | Freq: Once | INTRAVENOUS | Status: AC
  Administered 2017-10-05: 5 mg via INTRAVENOUS
  Filled 2017-10-05: qty 5

## 2017-10-05 MED ORDER — LACTATED RINGERS IV BOLUS
1000.0000 mL | Freq: Once | INTRAVENOUS | Status: AC
  Administered 2017-10-05: 1000 mL via INTRAVENOUS

## 2017-10-05 MED ORDER — MORPHINE SULFATE (PF) 2 MG/ML IV SOLN
2.0000 mg | INTRAVENOUS | Status: DC | PRN
  Administered 2017-10-07: 2 mg via INTRAVENOUS
  Filled 2017-10-05: qty 1

## 2017-10-05 MED ORDER — BISMUTH SUBSALICYLATE 262 MG/15ML PO SUSP: 30.0000 mL | ORAL | Status: DC | PRN

## 2017-10-05 MED ORDER — HYDROCODONE-ACETAMINOPHEN 5-325 MG PO TABS
1.0000 | ORAL_TABLET | Freq: Four times a day (QID) | ORAL | Status: DC | PRN
  Administered 2017-10-05: 1 via ORAL
  Administered 2017-10-06: 2 via ORAL
  Administered 2017-10-06 (×3): 1 via ORAL
  Administered 2017-10-07: 2 via ORAL
  Administered 2017-10-07 (×2): 1 via ORAL
  Administered 2017-10-08 (×2): 2 via ORAL
  Filled 2017-10-05 (×3): qty 1
  Filled 2017-10-05 (×3): qty 2
  Filled 2017-10-05: qty 1
  Filled 2017-10-05: qty 2
  Filled 2017-10-05 (×2): qty 1
  Filled 2017-10-05: qty 2

## 2017-10-05 NOTE — Progress Notes (Signed)
PROGRESS NOTE    Kathryn Underwood  YIF:[id number] DOB: October 27, 1960 DOA: 10/03/2017 PCP: Ma Hillock, DO   Brief Narrative: Kathryn Underwood is a 57 y.o. female with medical history significant of EtOH abuse, cocaine and THC abuse. She presented with nausea and requesting detox. She was found to have acute pancreatitis.   Assessment & Plan:   Principal Problem:   Alcoholic ketoacidosis Active Problems:   Cough   Alcohol dependence with unspecified alcohol-induced disorder (HCC)   Transaminitis   Epigastric pain   Alcohol ketoacidosis Improved today with IV fluids.  Acute pancreatitis Alcohol induced. Nausea with an episode of vomiting this morning -Will try a clear liquid diet this morning -Zofran prn -Norco prn; Morphine IV prn breakthrough  Alcohol abuse -CIWA  Alcoholic hepatitis Acute elevations of AST/ALT. Currently trending down. Bilirubin elevated. -Repeat CMP in AM  Tachycardia Possibly secondary to withdrawal vs pain/pancreatitis -LR bolus   DVT prophylaxis: Lovenox Code Status:   Code Status: Prior Family Communication: None at bedside Disposition Plan: Discharge when able to tolerate oral intake consistently   Consultants:   None  Procedures:   None  Antimicrobials:  None    Subjective: Abdominal pain improving.  Objective: Vitals:   10/05/17 0431 10/05/17 0615 10/05/17 0800 10/05/17 1216  BP:  106/80  111/77  Pulse: (!) 110 (!) 119 (!) 130 (!) 124  Resp:  20  20  Temp:  99.3 F (37.4 C)  97.9 F (36.6 C)  TempSrc:    Oral  SpO2:  93%  92%  Weight:      Height:        Intake/Output Summary (Last 24 hours) at 10/05/2017 1349 Last data filed at 10/05/2017 0600 Gross per 24 hour  Intake 2628.82 ml  Output -  Net 2628.82 ml   Filed Weights   10/03/17 2131  Weight: 63.5 kg    Examination:  General exam: Appears calm and comfortable Respiratory system: Clear to auscultation. Respiratory effort normal. Cardiovascular  system: S1 & S2 heard, Tachycardia, regular rhythm. No murmurs, rubs, gallops or clicks. Gastrointestinal system: Abdomen is nondistended, soft and nontender. No organomegaly or masses felt. Normal bowel sounds heard. Central nervous system: Alert and oriented. No focal neurological deficits. Extremities: No edema. No calf tenderness Skin: No cyanosis. No rashes Psychiatry: Judgement and insight appear normal. Mood & affect appropriate.     Data Reviewed: I have personally reviewed following labs and imaging studies  CBC: Recent Labs  Lab 10/03/17 2213 10/04/17 0459  WBC 6.6 9.2  HGB 14.3 13.0  HCT 41.2 39.6  MCV 102.5* 104.8*  PLT 302 867   Basic Metabolic Panel: Recent Labs  Lab 10/03/17 2213 10/04/17 0459  NA 134* 138  K 3.5 3.8  CL 93* 101  CO2 21* 25  GLUCOSE 115* 159*  BUN 14 13  CREATININE 0.52 0.58  CALCIUM 9.3 8.8*   GFR: Estimated Creatinine Clearance: 68.7 mL/min (by C-G formula based on SCr of 0.58 mg/dL). Liver Function Tests: Recent Labs  Lab 10/03/17 2213 10/04/17 0459  AST 373* 254*  ALT 170* 135*  ALKPHOS 118 93  BILITOT 1.3* 1.8*  PROT 7.3 6.2*  ALBUMIN 4.2 3.8   Recent Labs  Lab 10/03/17 2213 10/04/17 0459  LIPASE 68* 914*   No results for input(s): AMMONIA in the last 168 hours. Coagulation Profile: Recent Labs  Lab 10/04/17 1422  INR 0.95   Cardiac Enzymes: Recent Labs  Lab 10/04/17 0459  TROPONINI <0.03   BNP (  last 3 results) No results for input(s): PROBNP in the last 8760 hours. HbA1C: No results for input(s): HGBA1C in the last 72 hours. CBG: No results for input(s): GLUCAP in the last 168 hours. Lipid Profile: No results for input(s): CHOL, HDL, LDLCALC, TRIG, CHOLHDL, LDLDIRECT in the last 72 hours. Thyroid Function Tests: No results for input(s): TSH, T4TOTAL, FREET4, T3FREE, THYROIDAB in the last 72 hours. Anemia Panel: No results for input(s): VITAMINB12, FOLATE, FERRITIN, TIBC, IRON, RETICCTPCT in the last  72 hours. Sepsis Labs: Recent Labs  Lab 10/03/17 2356 10/04/17 0133  LATICACIDVEN 2.19* 1.62    No results found for this or any previous visit (from the past 240 hour(s)).       Radiology Studies: Dg Chest 2 View  Result Date: 10/04/2017 CLINICAL DATA:  Cough. EXAM: CHEST - 2 VIEW COMPARISON:  03/26/2016 FINDINGS: The cardiomediastinal contours are normal. Minimal central bronchitic change. Pulmonary vasculature is normal. No consolidation, pleural effusion, or pneumothorax. No acute osseous abnormalities are seen. IMPRESSION: Minimal chronic central bronchitic change.  No acute findings. Electronically Signed   By: Keith Rake M.D.   On: 10/04/2017 04:00        Scheduled Meds: . enoxaparin (LOVENOX) injection  40 mg Subcutaneous Q24H  . escitalopram  20 mg Oral Daily  . folic acid  1 mg Oral Daily  . gabapentin  600 mg Oral BID  . LORazepam  0-4 mg Intravenous Q6H   Followed by  . [START ON 10/06/2017] LORazepam  0-4 mg Intravenous Q12H  . multivitamin with minerals  1 tablet Oral Daily  . nicotine  21 mg Transdermal Daily  . thiamine  100 mg Oral Daily   Or  . thiamine  100 mg Intravenous Daily   Continuous Infusions: . dextrose 5 % and 0.45% NaCl 100 mL/hr at 10/05/17 0600  . famotidine (PEPCID) IV 20 mg (10/05/17 1100)     LOS: 0 days     Cordelia Poche, MD [facility] 10/05/2017, 1:49 PM Pager: [phone]  If 7PM-7AM, please contact night-coverage [email or web address] 10/05/2017, 1:49 PM

## 2017-10-06 ENCOUNTER — Inpatient Hospital Stay (HOSPITAL_COMMUNITY): Payer: Self-pay

## 2017-10-06 DIAGNOSIS — E872 Acidosis: Secondary | ICD-10-CM

## 2017-10-06 LAB — COMPREHENSIVE METABOLIC PANEL
ALT: 60 U/L — AB (ref 0–44)
ANION GAP: 10 (ref 5–15)
AST: 59 U/L — ABNORMAL HIGH (ref 15–41)
Albumin: 2.6 g/dL — ABNORMAL LOW (ref 3.5–5.0)
Alkaline Phosphatase: 75 U/L (ref 38–126)
BUN: 12 mg/dL (ref 6–20)
CHLORIDE: 100 mmol/L (ref 98–111)
CO2: 24 mmol/L (ref 22–32)
Calcium: 7.6 mg/dL — ABNORMAL LOW (ref 8.9–10.3)
Creatinine, Ser: 0.49 mg/dL (ref 0.44–1.00)
Glucose, Bld: 141 mg/dL — ABNORMAL HIGH (ref 70–99)
Potassium: 3.5 mmol/L (ref 3.5–5.1)
SODIUM: 134 mmol/L — AB (ref 135–145)
Total Bilirubin: 1 mg/dL (ref 0.3–1.2)
Total Protein: 4.9 g/dL — ABNORMAL LOW (ref 6.5–8.1)

## 2017-10-06 LAB — CBC
HCT: 38.5 % (ref 36.0–46.0)
HEMOGLOBIN: 12.7 g/dL (ref 12.0–15.0)
MCH: 35.1 pg — ABNORMAL HIGH (ref 26.0–34.0)
MCHC: 33 g/dL (ref 30.0–36.0)
MCV: 106.4 fL — AB (ref 78.0–100.0)
Platelets: 200 10*3/uL (ref 150–400)
RBC: 3.62 MIL/uL — AB (ref 3.87–5.11)
RDW: 14.3 % (ref 11.5–15.5)
WBC: 11.4 10*3/uL — AB (ref 4.0–10.5)

## 2017-10-06 LAB — LIPASE, BLOOD: LIPASE: 120 U/L — AB (ref 11–51)

## 2017-10-06 MED ORDER — ONDANSETRON HCL 4 MG/2ML IJ SOLN
4.0000 mg | Freq: Three times a day (TID) | INTRAMUSCULAR | Status: DC
  Administered 2017-10-06 – 2017-10-08 (×6): 4 mg via INTRAVENOUS
  Filled 2017-10-06 (×6): qty 2

## 2017-10-06 MED ORDER — SODIUM CHLORIDE 0.9 % IV SOLN
INTRAVENOUS | Status: DC
  Administered 2017-10-06 – 2017-10-08 (×2): via INTRAVENOUS

## 2017-10-06 MED ORDER — POTASSIUM CHLORIDE 10 MEQ/100ML IV SOLN
10.0000 meq | INTRAVENOUS | Status: AC
  Administered 2017-10-06 (×2): 10 meq via INTRAVENOUS
  Filled 2017-10-06 (×2): qty 100

## 2017-10-06 NOTE — Progress Notes (Signed)
PROGRESS NOTE    Kathryn Underwood  AQT:[id number] DOB: March 10, 1960 DOA: 10/03/2017 PCP: Ma Hillock, DO  Brief Narrative:  57 y.o. femalewith medical history significant ofEtOH abuse, cocaine and THC abuse. She presented with nausea and requesting detox. She was found to have acute pancreatitis.   Assessment & Plan:   Principal Problem:   Alcoholic ketoacidosis Active Problems:   Cough   Alcohol dependence with unspecified alcohol-induced disorder (HCC)   Transaminitis   Epigastric pain   Alcohol-induced pancreatitis   Alcohol ketoacidosis not tolerating p.o. intake with persistent nausea we will continue  IV fluids.  Acute pancreatitis/ileus Alcohol induced.  Persistent nausea no vomiting still on full liquids, abdomen distended with no bowel movements.  Rule out ileus check KUB.  WBCs increased today.  Still tachycardic.  Will need to do a CT of the abdomen and pelvis if persistent and increased WBCs and fever and tachycardia.  Patient at high risk for clinical deterioration.    Alcohol abuse -CIWA score above 13 high risk for DTs.  Alcoholic hepatitis Acute elevations of AST/ALT. Currently trending down. Bilirubin was elevate, resolved. -Repeat CMP in AM  Tachycardia-persistent still in the 120s to 125. Possibly secondary to withdrawal vs pain/pancreatitis   Hypokalemia secondary to alcohol use replete   DVT prophylaxis: Lovenox Code Status: Full code Family Communication: Discussed with daughter who was in the room Disposition Plan: TBD  Consultants:  None Procedures: None Antimicrobials None Subjective: Patient resting in bed complaining of nausea and abdominal distention.  Has not had a bowel movement nor passing gas.   Objective: Vitals:   10/05/17 2029 10/06/17 0000 10/06/17 0427 10/06/17 0600  BP: 125/85 120/80 115/80 115/80  Pulse: (!) 125 (!) 125 (!) 124 (!) 120  Resp: 17  20   Temp: 99.1 F (37.3 C)  98.1 F (36.7 C)   TempSrc: Oral   Oral   SpO2: 96%  98%   Weight:      Height:        Intake/Output Summary (Last 24 hours) at 10/06/2017 1412 Last data filed at 10/06/2017 1200 Gross per 24 hour  Intake 3398.09 ml  Output -  Net 3398.09 ml   Filed Weights   10/03/17 2131  Weight: 63.5 kg    Examination: Daughter by the bedside  General exam tremors somewhat jittery mildly confused Respiratory system: Clear to auscultation. Respiratory effort normal. Cardiovascular system: S1 & S2 heard, RRR. No JVD, murmurs, rubs, gallops or clicks. No pedal edema. Gastrointestinal system: Abdomen is distended, soft and nontender. No organomegaly or masses felt.bowel sounds diminished Central nervous system: Awake. No focal neurological deficits. Extremities: Symmetric 5 x 5 power. Skin: No rashes, lesions or ulcers  Data Reviewed: I have personally reviewed following labs and imaging studies  CBC: Recent Labs  Lab 10/03/17 2213 10/04/17 0459 10/06/17 0601  WBC 6.6 9.2 11.4*  HGB 14.3 13.0 12.7  HCT 41.2 39.6 38.5  MCV 102.5* 104.8* 106.4*  PLT 302 312 562   Basic Metabolic Panel: Recent Labs  Lab 10/03/17 2213 10/04/17 0459 10/06/17 0601  NA 134* 138 134*  K 3.5 3.8 3.5  CL 93* 101 100  CO2 21* 25 24  GLUCOSE 115* 159* 141*  BUN 14 13 12   CREATININE 0.52 0.58 0.49  CALCIUM 9.3 8.8* 7.6*   GFR: Estimated Creatinine Clearance: 68.7 mL/min (by C-G formula based on SCr of 0.49 mg/dL). Liver Function Tests: Recent Labs  Lab 10/03/17 2213 10/04/17 0459 10/06/17 0601  AST 373*  254* 59*  ALT 170* 135* 60*  ALKPHOS 118 93 75  BILITOT 1.3* 1.8* 1.0  PROT 7.3 6.2* 4.9*  ALBUMIN 4.2 3.8 2.6*   Recent Labs  Lab 10/03/17 2213 10/04/17 0459 10/06/17 0601  LIPASE 68* 914* 120*   No results for input(s): AMMONIA in the last 168 hours. Coagulation Profile: Recent Labs  Lab 10/04/17 1422  INR 0.95   Cardiac Enzymes: Recent Labs  Lab 10/04/17 0459  TROPONINI <0.03   BNP (last 3 results) No  results for input(s): PROBNP in the last 8760 hours. HbA1C: No results for input(s): HGBA1C in the last 72 hours. CBG: No results for input(s): GLUCAP in the last 168 hours. Lipid Profile: No results for input(s): CHOL, HDL, LDLCALC, TRIG, CHOLHDL, LDLDIRECT in the last 72 hours. Thyroid Function Tests: No results for input(s): TSH, T4TOTAL, FREET4, T3FREE, THYROIDAB in the last 72 hours. Anemia Panel: No results for input(s): VITAMINB12, FOLATE, FERRITIN, TIBC, IRON, RETICCTPCT in the last 72 hours. Sepsis Labs: Recent Labs  Lab 10/03/17 2356 10/04/17 0133  LATICACIDVEN 2.19* 1.62    No results found for this or any previous visit (from the past 240 hour(s)).       Radiology Studies: No results found.      Scheduled Meds: . enoxaparin (LOVENOX) injection  40 mg Subcutaneous Q24H  . escitalopram  20 mg Oral Daily  . folic acid  1 mg Oral Daily  . gabapentin  600 mg Oral BID  . LORazepam  0-4 mg Intravenous Q12H  . multivitamin with minerals  1 tablet Oral Daily  . nicotine  21 mg Transdermal Daily  . thiamine  100 mg Oral Daily   Or  . thiamine  100 mg Intravenous Daily   Continuous Infusions: . dextrose 5 % and 0.45% NaCl 100 mL/hr at 10/06/17 1356  . famotidine (PEPCID) IV Stopped (10/06/17 1025)     LOS: 1 day     Georgette Shell,  If 7PM-7AM, please contact night-coverage [email or web address] Password [email or web address] 10/06/2017, 2:12 PM

## 2017-10-07 DIAGNOSIS — K8521 Alcohol induced acute pancreatitis with uninfected necrosis: Secondary | ICD-10-CM

## 2017-10-07 DIAGNOSIS — R1013 Epigastric pain: Secondary | ICD-10-CM

## 2017-10-07 DIAGNOSIS — F1029 Alcohol dependence with unspecified alcohol-induced disorder: Principal | ICD-10-CM

## 2017-10-07 LAB — COMPREHENSIVE METABOLIC PANEL
ALBUMIN: 2.7 g/dL — AB (ref 3.5–5.0)
ALT: 60 U/L — ABNORMAL HIGH (ref 0–44)
AST: 71 U/L — AB (ref 15–41)
Alkaline Phosphatase: 110 U/L (ref 38–126)
Anion gap: 11 (ref 5–15)
BILIRUBIN TOTAL: 1.1 mg/dL (ref 0.3–1.2)
BUN: 11 mg/dL (ref 6–20)
CALCIUM: 8 mg/dL — AB (ref 8.9–10.3)
CO2: 24 mmol/L (ref 22–32)
CREATININE: 0.44 mg/dL (ref 0.44–1.00)
Chloride: 100 mmol/L (ref 98–111)
GFR calc Af Amer: >60 mL/min (ref >60)
GFR calc non Af Amer: >60 mL/min (ref >60)
GLUCOSE: 117 mg/dL — AB (ref 70–99)
Potassium: 4.1 mmol/L (ref 3.5–5.1)
SODIUM: 135 mmol/L (ref 135–145)
Total Protein: 5.2 g/dL — ABNORMAL LOW (ref 6.5–8.1)

## 2017-10-07 LAB — CBC
HCT: 39.7 % (ref 36.0–46.0)
Hemoglobin: 13 g/dL (ref 12.0–15.0)
MCH: 35.1 pg — AB (ref 26.0–34.0)
MCHC: 32.7 g/dL (ref 30.0–36.0)
MCV: 107.3 fL — ABNORMAL HIGH (ref 78.0–100.0)
Platelets: 225 10*3/uL (ref 150–400)
RBC: 3.7 MIL/uL — ABNORMAL LOW (ref 3.87–5.11)
RDW: 14.5 % (ref 11.5–15.5)
WBC: 13.3 10*3/uL — ABNORMAL HIGH (ref 4.0–10.5)

## 2017-10-07 LAB — LIPASE, BLOOD: Lipase: 50 U/L (ref 11–51)

## 2017-10-07 NOTE — Progress Notes (Addendum)
PROGRESS NOTE    Kathryn Underwood  MHD:[id number] DOB: Jul 21, 1960 DOA: 10/03/2017 PCP: Ma Hillock, DO   Brief Narrative: 57 y.o.femalewith medical history significant ofEtOH abuse, cocaine and THC abuse. She presented with nausea and requesting detox. She was found to have acute pancreatitis.  Assessment & Plan:   Principal Problem:   Alcoholic ketoacidosis Active Problems:   Cough   Alcohol dependence with unspecified alcohol-induced disorder (HCC)   Transaminitis   Epigastric pain   Alcohol-induced pancreatitis   1] acute pancreatitis secondary to alcohol abuse.  Lipase trending down.  Patient still complains of abdominal pain and nausea.  Will continue Zofran advance diet.  KUB does not show any evidence of obstruction or ileus.  Leukocytosis worse today.  Monitor tomorrow.  Patient has been afebrile overnight.  However she still remains tachycardic.  Chest x-ray shows no evidence of infiltrates or effusion.  Will get her out of bed ambulate her in the bedroom and hallway.  And PT evaluation.  Consult to social worker has been placed for possible SNF placement as well as resources for alcohol rehabilitation.  2] hypokalemia repleted  3] tachycardia multifactorial secondary to alcohol withdrawal versus pain versus significant deconditioning.  4] 90s most likely etiology is alcohol abuse.  DVT prophylaxis: Lovenox Code Status: Full code Family Communication: Family available today Disposition Plan: Plan discharge in 24 hours if patient remains medically stable able to tolerate p.o. intake await PT evaluation. Consultants:  None Procedures none Antimicrobials: None Subjective still complaining of nauseous and abdominal pain and back pain has not gotten out of bed liquid has been ordered but patient has not had appetite to eat.  Objective: Vitals:   10/06/17 0427 10/06/17 0600 10/07/17 0038 10/07/17 0702  BP: 115/80 115/80 116/75 120/75  Pulse: (!) 124 (!) 120 (!) 122  (!) 120  Resp: 20  18 20   Temp: 98.1 F (36.7 C)  98 F (36.7 C) 98.4 F (36.9 C)  TempSrc: Oral  Oral Oral  SpO2: 98%  98% 98%  Weight:      Height:        Intake/Output Summary (Last 24 hours) at 10/07/2017 1127 Last data filed at 10/07/2017 0739 Gross per 24 hour  Intake 1084.16 ml  Output 50 ml  Net 1034.16 ml   Filed Weights   10/03/17 2131  Weight: 63.5 kg    Examination:  General exam: Appears calm and comfortable  Respiratory system: Clear to auscultation. Respiratory effort normal. Cardiovascular system: S1 & S2 heard, RRR. No JVD, murmurs, rubs, gallops or clicks. No pedal edema. Gastrointestinal system: Abdomen is nondistended, soft and generalized tender. No organomegaly or masses felt. Normal bowel sounds heard. Central nervous system: Alert and oriented. No focal neurological deficits. Extremities: Symmetric 5 x 5 power. Skin: No rashes, lesions or ulcers Psychiatry: Judgement and insight appear normal. Mood & affect appropriate.     Data Reviewed: I have personally reviewed following labs and imaging studies  CBC: Recent Labs  Lab 10/03/17 2213 10/04/17 0459 10/06/17 0601 10/07/17 0516  WBC 6.6 9.2 11.4* 13.3*  HGB 14.3 13.0 12.7 13.0  HCT 41.2 39.6 38.5 39.7  MCV 102.5* 104.8* 106.4* 107.3*  PLT 302 312 200 211   Basic Metabolic Panel: Recent Labs  Lab 10/03/17 2213 10/04/17 0459 10/06/17 0601 10/07/17 0516  NA 134* 138 134* 135  K 3.5 3.8 3.5 4.1  CL 93* 101 100 100  CO2 21* 25 24 24   GLUCOSE 115* 159* 141* 117*  BUN  14 13 12 11   CREATININE 0.52 0.58 0.49 0.44  CALCIUM 9.3 8.8* 7.6* 8.0*   GFR: Estimated Creatinine Clearance: 68.7 mL/min (by C-G formula based on SCr of 0.44 mg/dL). Liver Function Tests: Recent Labs  Lab 10/03/17 2213 10/04/17 0459 10/06/17 0601 10/07/17 0516  AST 373* 254* 59* 71*  ALT 170* 135* 60* 60*  ALKPHOS 118 93 75 110  BILITOT 1.3* 1.8* 1.0 1.1  PROT 7.3 6.2* 4.9* 5.2*  ALBUMIN 4.2 3.8 2.6* 2.7*     Recent Labs  Lab 10/03/17 2213 10/04/17 0459 10/06/17 0601 10/07/17 0516  LIPASE 68* 914* 120* 50   No results for input(s): AMMONIA in the last 168 hours. Coagulation Profile: Recent Labs  Lab 10/04/17 1422  INR 0.95   Cardiac Enzymes: Recent Labs  Lab 10/04/17 0459  TROPONINI <0.03   BNP (last 3 results) No results for input(s): PROBNP in the last 8760 hours. HbA1C: No results for input(s): HGBA1C in the last 72 hours. CBG: No results for input(s): GLUCAP in the last 168 hours. Lipid Profile: No results for input(s): CHOL, HDL, LDLCALC, TRIG, CHOLHDL, LDLDIRECT in the last 72 hours. Thyroid Function Tests: No results for input(s): TSH, T4TOTAL, FREET4, T3FREE, THYROIDAB in the last 72 hours. Anemia Panel: No results for input(s): VITAMINB12, FOLATE, FERRITIN, TIBC, IRON, RETICCTPCT in the last 72 hours. Sepsis Labs: Recent Labs  Lab 10/03/17 2356 10/04/17 0133  LATICACIDVEN 2.19* 1.62    No results found for this or any previous visit (from the past 240 hour(s)).       Radiology Studies: Dg Abd 1 View  Result Date: 10/06/2017 CLINICAL DATA:  Alcohol and cocaine abuse with abdomen pain. EXAM: ABDOMEN - 1 VIEW COMPARISON:  None. FINDINGS: The bowel gas pattern is normal. No radio-opaque calculi or other significant radiographic abnormality are seen. IMPRESSION: No bowel obstruction. Electronically Signed   By: Abelardo Diesel M.D.   On: 10/06/2017 19:33        Scheduled Meds: . enoxaparin (LOVENOX) injection  40 mg Subcutaneous Q24H  . escitalopram  20 mg Oral Daily  . folic acid  1 mg Oral Daily  . gabapentin  600 mg Oral BID  . LORazepam  0-4 mg Intravenous Q12H  . multivitamin with minerals  1 tablet Oral Daily  . nicotine  21 mg Transdermal Daily  . ondansetron (ZOFRAN) IV  4 mg Intravenous Q8H  . thiamine  100 mg Oral Daily   Or  . thiamine  100 mg Intravenous Daily   Continuous Infusions: . sodium chloride 75 mL/hr at 10/06/17 1618  .  famotidine (PEPCID) IV 20 mg (10/07/17 0959)     LOS: 2 days     Georgette Shell, MD [facility] If 7PM-7AM, please contact night-coverage [email or web address] Password [facility] 10/07/2017, 11:27 AM

## 2017-10-07 NOTE — Evaluation (Signed)
Physical Therapy Evaluation Patient Details Name: Kathryn Underwood MRN: [id number] DOB: 03-24-1960 Today's Date: 10/07/2017   History of Present Illness  57 yo female admitted with alcoholic ketoacidosis, acute pancreatitis, ileus, tachycardia. Hx of ETOH abuse, drug abuse  Clinical Impression  On eval, pt required Min assist for mobility. She walked ~12 feet with a RW. Pt is weak and unsteady. She is at risk for falls when mobilizing. Poor safety awareness noted. No family present during session. Recommend SNF if possible.     Follow Up Recommendations SNF(if possible/pt agreeable.) If she refuses placement, then HHPT and 24 hour supervision/assist. )    Equipment Recommendations  Rolling walker with 5" wheels    Recommendations for Other Services       Precautions / Restrictions Precautions Precautions: Fall Restrictions Weight Bearing Restrictions: No      Mobility  Bed Mobility Overal bed mobility: Needs Assistance Bed Mobility: Supine to Sit;Sit to Supine     Supine to sit: Min guard;HOB elevated Sit to supine: Min guard;HOB elevated   General bed mobility comments: Close guard for safety. Increased time.   Transfers Overall transfer level: Needs assistance Equipment used: Rolling walker (2 wheeled) Transfers: Sit to/from [facility] Sit to Stand: Min assist;From elevated surface Stand pivot transfers: Min assist;From elevated surface       General transfer comment: Assist to rise, stabilize, control descent. Multimodal cueing required. Increased time.   Ambulation/Gait Ambulation/Gait assistance: Min assist Gait Distance (Feet): 12 Feet Assistive device: Rolling walker (2 wheeled) Gait Pattern/deviations: Step-through pattern;Decreased stride length     General Gait Details: Assist to stabilize pt and maneuver safely with RW. Very slow gait speed. Unsteady.   Stairs            Wheelchair Mobility    Modified Rankin (Stroke  Patients Only)       Balance Overall balance assessment: Needs assistance         Standing balance support: Bilateral upper extremity supported Standing balance-Leahy Scale: Poor                               Pertinent Vitals/Pain Pain Assessment: Faces Faces Pain Scale: Hurts little more Pain Location: "all over" Pain Descriptors / Indicators: Sore;Discomfort Pain Intervention(s): Limited activity within patient's tolerance;Repositioned    Home Living Family/patient expects to be discharged to:: Private residence Living Arrangements: Spouse/significant other   Type of Home: House         Home Equipment: None      Prior Function Level of Independence: Independent               Hand Dominance        Extremity/Trunk Assessment   Upper Extremity Assessment Upper Extremity Assessment: Generalized weakness    Lower Extremity Assessment Lower Extremity Assessment: Generalized weakness    Cervical / Trunk Assessment Cervical / Trunk Assessment: Normal  Communication   Communication: No difficulties  Cognition Arousal/Alertness: Awake/alert(drowsy) Behavior During Therapy: Flat affect Overall Cognitive Status: Impaired/Different from baseline Area of Impairment: Safety/judgement;Problem solving                         Safety/Judgement: Decreased awareness of safety   Problem Solving: Requires verbal cues;Requires tactile cues        General Comments      Exercises     Assessment/Plan    PT Assessment Patient needs continued PT services  PT Problem List Decreased strength;Decreased balance;Decreased mobility;Decreased activity tolerance;Pain;Decreased knowledge of use of DME;Decreased safety awareness       PT Treatment Interventions Gait training;Functional mobility training;Therapeutic activities;DME instruction;Balance training;Patient/family education;Therapeutic exercise    PT Goals (Current goals can be found  in the Care Plan section)  Acute Rehab PT Goals Patient Stated Goal: none stated PT Goal Formulation: With patient Time For Goal Achievement: 10/21/17 Potential to Achieve Goals: Fair    Frequency Min 3X/week   Barriers to discharge        Co-evaluation               AM-PAC PT "6 Clicks" Daily Activity  Outcome Measure Difficulty turning over in bed (including adjusting bedclothes, sheets and blankets)?: A Little Difficulty moving from lying on back to sitting on the side of the bed? : A Lot Difficulty sitting down on and standing up from a chair with arms (e.g., wheelchair, bedside commode, etc,.)?: Unable Help needed moving to and from a bed to chair (including a wheelchair)?: A Little Help needed walking in hospital room?: A Little Help needed climbing 3-5 steps with a railing? : A Lot 6 Click Score: 14    End of Session   Activity Tolerance: Patient limited by fatigue Patient left: in bed;with call bell/phone within reach;with bed alarm set   PT Visit Diagnosis: Muscle weakness (generalized) (M62.81);Difficulty in walking, not elsewhere classified (R26.2);Unsteadiness on feet (R26.81)    Time: 8185-9093 PT Time Calculation (min) (ACUTE ONLY): 13 min   Charges:   PT Evaluation $PT Eval Moderate Complexity: [place], PT Acute Rehabilitation Services Pager: [phone] Office: [phone]

## 2017-10-08 ENCOUNTER — Emergency Department (HOSPITAL_COMMUNITY)
Admission: EM | Admit: 2017-10-08 | Discharge: 2017-10-09 | Disposition: A | Discharge status: Home / Self Care | Payer: Self-pay | Attending: Emergency Medicine | Admitting: Emergency Medicine

## 2017-10-08 ENCOUNTER — Encounter (HOSPITAL_COMMUNITY): Payer: Self-pay | Admitting: Emergency Medicine

## 2017-10-08 DIAGNOSIS — Z79899 Other long term (current) drug therapy: Secondary | ICD-10-CM | POA: Insufficient documentation

## 2017-10-08 DIAGNOSIS — S300XXA Contusion of lower back and pelvis, initial encounter: Secondary | ICD-10-CM | POA: Insufficient documentation

## 2017-10-08 DIAGNOSIS — Y999 Unspecified external cause status: Secondary | ICD-10-CM | POA: Insufficient documentation

## 2017-10-08 DIAGNOSIS — X509XXA Other and unspecified overexertion or strenuous movements or postures, initial encounter: Secondary | ICD-10-CM | POA: Insufficient documentation

## 2017-10-08 DIAGNOSIS — Y929 Unspecified place or not applicable: Secondary | ICD-10-CM | POA: Insufficient documentation

## 2017-10-08 DIAGNOSIS — F17210 Nicotine dependence, cigarettes, uncomplicated: Secondary | ICD-10-CM | POA: Insufficient documentation

## 2017-10-08 DIAGNOSIS — Y939 Activity, unspecified: Secondary | ICD-10-CM | POA: Insufficient documentation

## 2017-10-08 DIAGNOSIS — E876 Hypokalemia: Secondary | ICD-10-CM | POA: Insufficient documentation

## 2017-10-08 LAB — CBC
HCT: 35 % — ABNORMAL LOW (ref 36.0–46.0)
HEMOGLOBIN: 11.4 g/dL — AB (ref 12.0–15.0)
MCH: 34.9 pg — ABNORMAL HIGH (ref 26.0–34.0)
MCHC: 32.6 g/dL (ref 30.0–36.0)
MCV: 107 fL — AB (ref 78.0–100.0)
Platelets: 268 10*3/uL (ref 150–400)
RBC: 3.27 MIL/uL — ABNORMAL LOW (ref 3.87–5.11)
RDW: 14.7 % (ref 11.5–15.5)
WBC: 12.3 10*3/uL — ABNORMAL HIGH (ref 4.0–10.5)

## 2017-10-08 LAB — COMPREHENSIVE METABOLIC PANEL
ALBUMIN: 2.4 g/dL — AB (ref 3.5–5.0)
ALT: 51 U/L — ABNORMAL HIGH (ref 0–44)
ANION GAP: 10 (ref 5–15)
AST: 68 U/L — AB (ref 15–41)
Alkaline Phosphatase: 111 U/L (ref 38–126)
BUN: 9 mg/dL (ref 6–20)
CHLORIDE: 99 mmol/L (ref 98–111)
CO2: 29 mmol/L (ref 22–32)
Calcium: 7.8 mg/dL — ABNORMAL LOW (ref 8.9–10.3)
Creatinine, Ser: 0.45 mg/dL (ref 0.44–1.00)
GFR calc Af Amer: >60 mL/min (ref >60)
GFR calc non Af Amer: >60 mL/min (ref >60)
GLUCOSE: 107 mg/dL — AB (ref 70–99)
POTASSIUM: 4.3 mmol/L (ref 3.5–5.1)
SODIUM: 138 mmol/L (ref 135–145)
Total Bilirubin: 1.8 mg/dL — ABNORMAL HIGH (ref 0.3–1.2)
Total Protein: 4.5 g/dL — ABNORMAL LOW (ref 6.5–8.1)

## 2017-10-08 MED ORDER — THIAMINE HCL 100 MG PO TABS: 100.0000 mg | ORAL_TABLET | Freq: Every day | ORAL | Status: DC

## 2017-10-08 MED ORDER — ADULT MULTIVITAMIN W/MINERALS CH: 1.0000 | ORAL_TABLET | Freq: Every day | ORAL | Status: DC

## 2017-10-08 MED ORDER — NICOTINE 21 MG/24HR TD PT24: 21.0000 mg | MEDICATED_PATCH | Freq: Every day | TRANSDERMAL | 0 refills | Status: DC

## 2017-10-08 NOTE — Clinical Social Work Note (Signed)
Clinical Social Work Assessment  Patient Details  Name: Kathryn Underwood MRN: [id number] Date of Birth: [birth year]  Date of referral:  10/06/17               Reason for consult:  Facility Placement, Substance Use/ETOH Abuse                Permission sought to share information with:  Case Manager Permission granted to share information::  Yes, Verbal Permission Granted  Name::        Agency::  RNCM  Relationship::     Contact Information:     Housing/Transportation Living arrangements for the past 2 months:  Single Family Home Source of Information:  Patient Patient Interpreter Needed:  None Criminal Activity/Legal Involvement Pertinent to Current Situation/Hospitalization:  No - Comment as needed Significant Relationships:  Significant Other Lives with:    Do you feel safe going back to the place where you live?  Yes Need for family participation in patient care:  No (Coment)  Care giving concerns:  Patient tested positive for cocaine, THC, and alcohol upon admission. Patient denied substance use to CSW and did not want resources. Patient has no insurance. PT recommending SNF due to physical limitations.   Social Worker assessment / plan:  CSW met with patient at bedside to introduce role, discuss discharge plan, and patient's substance abuse. CSW explained to patient that SNF will not be an option due to her positive toxicology screens of cocaine and THC. Patient also does not have payor source for SNF.   CSW asked patient about substance use and asked patient if she wanted resources or help with treatment. Patient denied current substance abuse and resources.   CSW made referral to [facility] to assess patient's ability to receive HH.   No more CSW needs identified. Signing off.   Employment status:    Insurance information:  Self Pay (Medicaid Pending) PT Recommendations:  [place], Home with [place] / Referral to community resources:      Patient/Family's Response to care:  Patient responded appropriately to CSW's discussion concerning inability to go to SNF. Patient denied substance abuse resources from CSW.  Patient/Family's Understanding of and Emotional Response to Diagnosis, Current Treatment, and Prognosis:  Patient understands her limited options for rehab at discharge and why. She stated she understood she will not be able to go to SNF. Patient understands RNCM will discuss [place] options, if any are available to her. Patient denied substance abuse to CSW.  Emotional Assessment Appearance:  Appears older than stated age Attitude/Demeanor/Rapport:    Affect (typically observed):    Orientation:  Oriented to Self, Oriented to Place, Oriented to  Time, Oriented to Situation Alcohol / Substance use:  Illicit Drugs, Alcohol Use Psych involvement (Current and /or in the community):  No (Comment)  Discharge Needs  Concerns to be addressed:  Financial / Insurance Concerns, Substance Abuse Concerns Readmission within the last 30 days:  No Current discharge risk:  Substance Abuse, Inadequate Financial Supports Barriers to Discharge:  Active Substance Use   Pricilla Holm, LCSWA 10/08/2017, 1:08 PM

## 2017-10-08 NOTE — Progress Notes (Signed)
Any provider can make capacity decisions and psychiatric consult not warranted per Dr Dwyane Dee, psychiatric medical director.  There was a meeting addressing this issue this week.  Waylan Boga, PMHNP

## 2017-10-08 NOTE — Progress Notes (Signed)
Physical Therapy Treatment Patient Details Name: Kathryn Underwood MRN: [id number] DOB: 07/17/1960 Today's Date: 10/08/2017    History of Present Illness 57 yo female admitted with alcoholic ketoacidosis, acute pancreatitis, ileus, tachycardia. Hx of ETOH abuse, drug abuse    PT Comments    Pt participates with encouragement. She declined hallway ambulation on today. Recommend daily ambulation with nursing staff in addition to PT session to increase activity.    Follow Up Recommendations  SNF(if possible/pt agreeable. If pt refuses, then HHPT, 24 hour supervision)     Equipment Recommendations  Rolling walker with 5" wheels    Recommendations for Other Services       Precautions / Restrictions Precautions Precautions: Fall Restrictions Weight Bearing Restrictions: No    Mobility  Bed Mobility Overal bed mobility: Needs Assistance Bed Mobility: Sit to Supine       Sit to supine: Supervision   General bed mobility comments: for safety, lines. Increased time.   Transfers Overall transfer level: Needs assistance   Transfers: Sit to/from Stand Sit to Stand: Min assist Stand pivot transfers: Min assist       General transfer comment: Assist to rise, stabilize, control descent. Multimodal cueing required. Increased time.   Ambulation/Gait Ambulation/Gait assistance: Min assist Gait Distance (Feet): 13 Feet Assistive device: Rolling walker (2 wheeled) Gait Pattern/deviations: Step-through pattern;Decreased stride length     General Gait Details: Assist to stabilize pt and maneuver safely with RW. Very slow gait speed. Unsteady. PT declined hallway ambulation.    Stairs             Wheelchair Mobility    Modified Rankin (Stroke Patients Only)       Balance Overall balance assessment: Needs assistance         Standing balance support: Bilateral upper extremity supported Standing balance-Leahy Scale: Poor                               Cognition Arousal/Alertness: Awake/alert Behavior During Therapy: Flat affect Overall Cognitive Status: Impaired/Different from baseline Area of Impairment: Safety/judgement;Problem solving                         Safety/Judgement: Decreased awareness of safety   Problem Solving: Requires verbal cues;Requires tactile cues        Exercises      General Comments        Pertinent Vitals/Pain Pain Assessment: Faces Faces Pain Scale: Hurts little more Pain Location: low back Pain Descriptors / Indicators: Sore;Discomfort Pain Intervention(s): Limited activity within patient's tolerance;Repositioned    Home Living                      Prior Function            PT Goals (current goals can now be found in the care plan section) Progress towards PT goals: Progressing toward goals    Frequency    Min 3X/week      PT Plan Current plan remains appropriate    Co-evaluation              AM-PAC PT "6 Clicks" Daily Activity  Outcome Measure  Difficulty turning over in bed (including adjusting bedclothes, sheets and blankets)?: A Little Difficulty moving from lying on back to sitting on the side of the bed? : A Little Difficulty sitting down on and standing up from a chair with arms (e.g., wheelchair,  bedside commode, etc,.)?: Unable Help needed moving to and from a bed to chair (including a wheelchair)?: A Little Help needed walking in hospital room?: A Little Help needed climbing 3-5 steps with a railing? : A Lot 6 Click Score: 15    End of Session   Activity Tolerance: Patient limited by fatigue Patient left: in bed;with call bell/phone within reach;with bed alarm set   PT Visit Diagnosis: Muscle weakness (generalized) (M62.81);Difficulty in walking, not elsewhere classified (R26.2);Unsteadiness on feet (R26.81)     Time: 3744-5146 PT Time Calculation (min) (ACUTE ONLY): 8 min  Charges:  $Gait Training: 8-22 mins                         Weston Anna, PT Acute Rehabilitation Services Pager: [phone] Office: [phone]

## 2017-10-08 NOTE — ED Triage Notes (Signed)
Pt lost her balance today and fell in her bedroom, now complaining of lower back pain.  No LOC, did not hit head. Pt was just released today from [facility] after being treated for Alcohol Ketoacidosis.  Husband told EMS privetly that "she is morphine seeking."

## 2017-10-08 NOTE — Care Management Note (Signed)
Case Management Note  Patient Details  Name: BANEZA BARTOSZEK MRN: [id number] Date of Birth: Aug 07, 1960  Subjective/Objective: Spoke to patient about services available-patient able to answer questions-informed her that [facility] is not available due to cocaine, & etoh abuse. pAtient does not have a pcp-provided w/pcp listing encouraged [place]-patient will call on own for pcp appt. Patient lives @ home w/boyfriend,she has her own transp home. MD updated. No further CM needs.                   Action/Plan:d/c home.   Expected Discharge Date:  10/08/17               Expected Discharge Plan:  Home/Self Care  In-House Referral:     Discharge planning Services  CM Consult, [facility]  Post Acute Care Choice:  Durable Medical Equipment(rw) Choice offered to:     DME Arranged:    DME Agency:     HH Arranged:    HH Agency:     Status of Service:  Completed, signed off  If discussed at [employer] of Stay Meetings, dates discussed:    Additional Comments:  Dessa Phi, RN 10/08/2017, 3:34 PM

## 2017-10-08 NOTE — Discharge Summary (Addendum)
Physician Discharge Summary  Kathryn Underwood UJW:[id number] DOB: 03-14-1960 DOA: 10/03/2017  PCP: Howard Pouch A, DO  Admit date: 10/03/2017 Discharge date: 10/08/2017  Admitted From: Home Disposition: Home Recommendations for Outpatient Follow-up:  1. Follow up with PCP in 1-2 weeks 2. Please obtain BMP/CBC in one week   Home Health: None Equipment/Devices: None Discharge Condition stable CODE STATUS full code Diet recommendation: Cardiac Brief/Interim Summary:57 y.o.femalewith medical history significant ofEtOH abuse, cocaine and THC abuse. She presented with nausea and requesting detox. She was found to have acute pancreatitis.   Discharge Diagnoses:  Principal Problem:   Alcoholic ketoacidosis Active Problems:   Cough   Alcohol dependence with unspecified alcohol-induced disorder (HCC)   Transaminitis   Epigastric pain   Alcohol-induced pancreatitis   1] acute pancreatitis secondary to alcohol abuse.  Lipase down to normal.  Patient tolerating diet.  No nausea vomiting or diarrhea.  She is ambulating in hallway and in the room.  She is afebrile with improving WBC to 12.3 today.   Social worker to provide with resources for rehabilitation drug rehab as well as alcohol rehab.  Patient will be discharged today daughter to arrange for patient to follow-up with rehab for alcohol and drug use.  Upon admission her urine drug screen was positive for THC, cocaine and was positive for alcohol.  Patient lived alone prior to admission to the hospital.  Daughter to move in with her children with the patient.  2] hypokalemia resolved  3] DEPRESSION -RESTART LEXAPRO.   Discharge Instructions  Discharge Instructions    Call MD for:  difficulty breathing, headache or visual disturbances   Complete by:  As directed    Call MD for:  persistant dizziness or light-headedness   Complete by:  As directed    Call MD for:  persistant nausea and vomiting   Complete by:  As directed    Call  MD for:  temperature >100.4   Complete by:  As directed    Diet - low sodium heart healthy   Complete by:  As directed    Increase activity slowly   Complete by:  As directed      Allergies as of 10/08/2017   No Known Allergies     Medication List    STOP taking these medications   bismuth subsalicylate 562 ZH/08MV suspension Commonly known as:  PEPTO BISMOL   ibuprofen 200 MG tablet Commonly known as:  ADVIL,MOTRIN     TAKE these medications   escitalopram 10 MG tablet Commonly known as:  LEXAPRO Take 20 mg by mouth daily.   folic acid 1 MG tablet Commonly known as:  FOLVITE Take 1 tablet (1 mg total) by mouth daily.   gabapentin 300 MG capsule Commonly known as:  NEURONTIN Take 2 capsules (600 mg total) by mouth 2 (two) times daily.   MELATONIN PO Take 1-2 tablets by mouth at bedtime as needed (sleep).   multivitamin with minerals Tabs tablet Take 1 tablet by mouth daily. Start taking on:  10/09/2017   nicotine 21 mg/24hr patch Commonly known as:  NICODERM CQ - dosed in mg/24 hours Place 1 patch (21 mg total) onto the skin daily. Start taking on:  10/09/2017   thiamine 100 MG tablet Take 1 tablet (100 mg total) by mouth daily. Start taking on:  10/09/2017      Follow-up Information    Kuneff, Renee A, DO Follow up.   Specialty:  Family Medicine Contact information: [address] [place]  [address] [phone]          No Known Allergies  Consultations: NONE Procedures/Studies: Dg Chest 2 View  Result Date: 10/04/2017 CLINICAL DATA:  Cough. EXAM: CHEST - 2 VIEW COMPARISON:  03/26/2016 FINDINGS: The cardiomediastinal contours are normal. Minimal central bronchitic change. Pulmonary vasculature is normal. No consolidation, pleural effusion, or pneumothorax. No acute osseous abnormalities are seen. IMPRESSION: Minimal chronic central bronchitic change.  No acute findings. Electronically Signed   By: Keith Rake M.D.   On: 10/04/2017 04:00    Dg Abd 1 View  Result Date: 10/06/2017 CLINICAL DATA:  Alcohol and cocaine abuse with abdomen pain. EXAM: ABDOMEN - 1 VIEW COMPARISON:  None. FINDINGS: The bowel gas pattern is normal. No radio-opaque calculi or other significant radiographic abnormality are seen. IMPRESSION: No bowel obstruction. Electronically Signed   By: Abelardo Diesel M.D.   On: 10/06/2017 19:33    (Echo, Carotid, EGD, Colonoscopy, ERCP)    Subjective:   Discharge Exam: Vitals:   10/07/17 1308 10/08/17 0230  BP: 109/71 (!) 105/94  Pulse: (!) 123 (!) 112  Resp: 18 18  Temp: 98.7 F (37.1 C) 97.7 F (36.5 C)  SpO2: 97% 91%   Vitals:   10/07/17 0038 10/07/17 0702 10/07/17 1308 10/08/17 0230  BP: 116/75 120/75 109/71 (!) 105/94  Pulse: (!) 122 (!) 120 (!) 123 (!) 112  Resp: 18 20 18 18   Temp: 98 F (36.7 C) 98.4 F (36.9 C) 98.7 F (37.1 C) 97.7 F (36.5 C)  TempSrc: Oral Oral Oral Oral  SpO2: 98% 98% 97% 91%  Weight:      Height:        General: Pt is alert, awake, not in acute distress Cardiovascular: RRR, S1/S2 +, no rubs, no gallops Respiratory: CTA bilaterally, no wheezing, no rhonchi Abdominal: Soft, NT, ND, bowel sounds + Extremities: no edema, no cyanosis    The results of significant diagnostics from this hospitalization (including imaging, microbiology, ancillary and laboratory) are listed below for reference.     Microbiology: No results found for this or any previous visit (from the past 240 hour(s)).   Labs: BNP (last 3 results) No results for input(s): BNP in the last 8760 hours. Basic Metabolic Panel: Recent Labs  Lab 10/03/17 2213 10/04/17 0459 10/06/17 0601 10/07/17 0516 10/08/17 0504  NA 134* 138 134* 135 138  K 3.5 3.8 3.5 4.1 4.3  CL 93* 101 100 100 99  CO2 21* 25 24 24 29   GLUCOSE 115* 159* 141* 117* 107*  BUN 14 13 12 11 9   CREATININE 0.52 0.58 0.49 0.44 0.45  CALCIUM 9.3 8.8* 7.6* 8.0* 7.8*   Liver Function Tests: Recent Labs  Lab 10/03/17 2213  10/04/17 0459 10/06/17 0601 10/07/17 0516 10/08/17 0504  AST 373* 254* 59* 71* 68*  ALT 170* 135* 60* 60* 51*  ALKPHOS 118 93 75 110 111  BILITOT 1.3* 1.8* 1.0 1.1 1.8*  PROT 7.3 6.2* 4.9* 5.2* 4.5*  ALBUMIN 4.2 3.8 2.6* 2.7* 2.4*   Recent Labs  Lab 10/03/17 2213 10/04/17 0459 10/06/17 0601 10/07/17 0516  LIPASE 68* 914* 120* 50   No results for input(s): AMMONIA in the last 168 hours. CBC: Recent Labs  Lab 10/03/17 2213 10/04/17 0459 10/06/17 0601 10/07/17 0516 10/08/17 0504  WBC 6.6 9.2 11.4* 13.3* 12.3*  HGB 14.3 13.0 12.7 13.0 11.4*  HCT 41.2 39.6 38.5 39.7 35.0*  MCV 102.5* 104.8* 106.4* 107.3* 107.0*  PLT 302 312 200 225 268   Cardiac Enzymes:  Recent Labs  Lab 10/04/17 0459  TROPONINI <0.03   BNP: Invalid input(s): POCBNP CBG: No results for input(s): GLUCAP in the last 168 hours. D-Dimer No results for input(s): DDIMER in the last 72 hours. Hgb A1c No results for input(s): HGBA1C in the last 72 hours. Lipid Profile No results for input(s): CHOL, HDL, LDLCALC, TRIG, CHOLHDL, LDLDIRECT in the last 72 hours. Thyroid function studies No results for input(s): TSH, T4TOTAL, T3FREE, THYROIDAB in the last 72 hours.  Invalid input(s): FREET3 Anemia work up No results for input(s): VITAMINB12, FOLATE, FERRITIN, TIBC, IRON, RETICCTPCT in the last 72 hours. Urinalysis    Component Value Date/Time   COLORURINE AMBER (A) 10/03/2017 2213   APPEARANCEUR CLEAR 10/03/2017 2213   LABSPEC >1.030 (H) 10/03/2017 2213   PHURINE 5.0 10/03/2017 2213   GLUCOSEU NEGATIVE 10/03/2017 2213   HGBUR NEGATIVE 10/03/2017 2213   BILIRUBINUR SMALL (A) 10/03/2017 2213   KETONESUR 40 (A) 10/03/2017 2213   PROTEINUR NEGATIVE 10/03/2017 2213   UROBILINOGEN 0.2 01/23/2010 1750   NITRITE NEGATIVE 10/03/2017 2213   LEUKOCYTESUR NEGATIVE 10/03/2017 2213   Sepsis Labs Invalid input(s): PROCALCITONIN,  WBC,  LACTICIDVEN Microbiology No results found for this or any previous visit  (from the past 240 hour(s)).   Time coordinating discharge:  36 minutes  SIGNED:   Georgette Shell, MD  [facility] 10/08/2017, 12:39 PM Pager   If 7PM-7AM, please contact night-coverage [email or web address] Password [email or web address]

## 2017-10-09 ENCOUNTER — Emergency Department (HOSPITAL_COMMUNITY): Payer: Self-pay

## 2017-10-09 LAB — PROTIME-INR
INR: 1.09
Prothrombin Time: 14 seconds (ref 11.4–15.2)

## 2017-10-09 LAB — COMPREHENSIVE METABOLIC PANEL
ALT: 55 U/L — AB (ref 0–44)
AST: 76 U/L — ABNORMAL HIGH (ref 15–41)
Albumin: 2.4 g/dL — ABNORMAL LOW (ref 3.5–5.0)
Alkaline Phosphatase: 164 U/L — ABNORMAL HIGH (ref 38–126)
Anion gap: 13 (ref 5–15)
BUN: 7 mg/dL (ref 6–20)
CALCIUM: 8.1 mg/dL — AB (ref 8.9–10.3)
CO2: 29 mmol/L (ref 22–32)
CREATININE: 0.53 mg/dL (ref 0.44–1.00)
Chloride: 96 mmol/L — ABNORMAL LOW (ref 98–111)
Glucose, Bld: 101 mg/dL — ABNORMAL HIGH (ref 70–99)
Potassium: 2.8 mmol/L — ABNORMAL LOW (ref 3.5–5.1)
Sodium: 138 mmol/L (ref 135–145)
Total Bilirubin: 1.5 mg/dL — ABNORMAL HIGH (ref 0.3–1.2)
Total Protein: 4.6 g/dL — ABNORMAL LOW (ref 6.5–8.1)

## 2017-10-09 LAB — CBC WITH DIFFERENTIAL/PLATELET
BASOS PCT: 1 %
Basophils Absolute: 0.1 10*3/uL (ref 0.0–0.1)
EOS ABS: 0.1 10*3/uL (ref 0.0–0.7)
Eosinophils Relative: 1 %
HCT: 33.3 % — ABNORMAL LOW (ref 36.0–46.0)
HEMOGLOBIN: 11 g/dL — AB (ref 12.0–15.0)
LYMPHS ABS: 1.2 10*3/uL (ref 0.7–4.0)
LYMPHS PCT: 9 %
MCH: 35.1 pg — AB (ref 26.0–34.0)
MCHC: 33 g/dL (ref 30.0–36.0)
MCV: 106.4 fL — AB (ref 78.0–100.0)
Monocytes Absolute: 2.5 10*3/uL — ABNORMAL HIGH (ref 0.1–1.0)
Monocytes Relative: 19 %
NEUTROS ABS: 9.4 10*3/uL — AB (ref 1.7–7.7)
Neutrophils Relative %: 70 %
PLATELETS: 325 10*3/uL (ref 150–400)
RBC: 3.13 MIL/uL — ABNORMAL LOW (ref 3.87–5.11)
RDW: 14.4 % (ref 11.5–15.5)
WBC: 13.3 10*3/uL — ABNORMAL HIGH (ref 4.0–10.5)

## 2017-10-09 LAB — LIPASE, BLOOD: Lipase: 29 U/L (ref 11–51)

## 2017-10-09 LAB — URINALYSIS, ROUTINE W REFLEX MICROSCOPIC
BILIRUBIN URINE: NEGATIVE
Bacteria, UA: NONE SEEN
GLUCOSE, UA: NEGATIVE mg/dL
KETONES UR: 80 mg/dL — AB
NITRITE: NEGATIVE
PROTEIN: NEGATIVE mg/dL
Specific Gravity, Urine: 1.011 (ref 1.005–1.030)
pH: 6 (ref 5.0–8.0)

## 2017-10-09 LAB — AMMONIA: Ammonia: 42 umol/L — ABNORMAL HIGH (ref 9–35)

## 2017-10-09 MED ORDER — POTASSIUM CHLORIDE CRYS ER 20 MEQ PO TBCR: 20.0000 meq | EXTENDED_RELEASE_TABLET | Freq: Every day | ORAL | 0 refills | Status: DC

## 2017-10-09 MED ORDER — KETOROLAC TROMETHAMINE 30 MG/ML IJ SOLN
30.0000 mg | Freq: Once | INTRAMUSCULAR | Status: AC
  Administered 2017-10-09: 30 mg via INTRAVENOUS
  Filled 2017-10-09: qty 1

## 2017-10-09 MED ORDER — CYCLOBENZAPRINE HCL 10 MG PO TABS: 10.0000 mg | ORAL_TABLET | Freq: Three times a day (TID) | ORAL | 0 refills | Status: DC | PRN

## 2017-10-09 MED ORDER — POTASSIUM CHLORIDE CRYS ER 20 MEQ PO TBCR
40.0000 meq | EXTENDED_RELEASE_TABLET | Freq: Once | ORAL | Status: AC
  Administered 2017-10-09: 40 meq via ORAL
  Filled 2017-10-09: qty 2

## 2017-10-09 MED ORDER — GABAPENTIN 300 MG PO CAPS: 600.0000 mg | ORAL_CAPSULE | Freq: Two times a day (BID) | ORAL | 0 refills | Status: DC

## 2017-10-09 NOTE — ED Notes (Signed)
Pt ambulated with this RN and Benita, EMT, pt appeared steady on her feet. O2 sat monitored during ambulation, sat consistently 94% on room air, nad.

## 2017-10-09 NOTE — ED Provider Notes (Signed)
[place] EMERGENCY DEPARTMENT Provider Note   CSN: [id number] Arrival date & time: 10/08/17  2204     History   Chief Complaint Chief Complaint  Patient presents with  . Fall  . Back Pain    HPI Kathryn Underwood is a 57 y.o. female.  Patient presents to the emergency department for evaluation of low back pain after a fall.  Patient complaining of pain in the lower back that does not radiate to the legs.  She just was discharged from [facility] long hospital earlier today after being treated for alcoholic ketoacidosis, alcohol induced pancreatitis and alcohol withdrawal.  She has not had any alcohol for more than a week.  Patient continues to complain of abdominal pain which she reports has been continuous since before she was hospitalized at [facility].  She does not think she hit her head or lost consciousness.  She denies headache and neck pain.     Past Medical History:  Diagnosis Date  . Alcohol abuse   . Anxiety   . Chickenpox   . Depression   . Neuromuscular disorder (HCC)    neuropathy  . Substance abuse [facility])     Patient Active Problem List   Diagnosis Date Noted  . Alcohol-induced pancreatitis 10/05/2017  . Alcoholic ketoacidosis 54/62/7035  . Epigastric pain 10/04/2017  . MDD (major depressive disorder), recurrent severe, without psychosis ([place]) 04/28/2016  . Alcohol withdrawal ([place]) 04/25/2016  . Tachycardia 04/25/2016  . Transaminitis 04/25/2016  . Leukocytosis 04/25/2016  . Closed nondisplaced fracture of fifth right metatarsal bone 04/08/2016  . Acute pain of right shoulder 04/08/2016  . Vitamin D deficiency 12/11/2015  . Alcohol dependence with unspecified alcohol-induced disorder ([place]) 12/09/2015  . Fatigue 12/09/2015  . Encounter for alcohol abuse counseling and surveillance 12/09/2015  . Cough 01/04/2015  . Tobacco abuse 01/04/2015  . Depression with anxiety 01/04/2015    Past Surgical History:  Procedure Laterality Date    . CESAREAN SECTION    . TONSILLECTOMY       OB History   None      Home Medications    Prior to Admission medications   Medication Sig Start Date End Date Taking? Authorizing Provider  escitalopram (LEXAPRO) 10 MG tablet Take 20 mg by mouth daily.  09/18/16   [provider]  folic acid (FOLVITE) 1 MG tablet Take 1 tablet (1 mg total) by mouth daily. 04/29/16   Raiford Noble Latif, DO  gabapentin (NEURONTIN) 300 MG capsule Take 2 capsules (600 mg total) by mouth 2 (two) times daily. 11/12/16   Georgette Shell, MD  MELATONIN PO Take 1-2 tablets by mouth at bedtime as needed (sleep).    [provider]  Multiple Vitamin (MULTIVITAMIN WITH MINERALS) TABS tablet Take 1 tablet by mouth daily. 10/09/17   Georgette Shell, MD  nicotine (NICODERM CQ - DOSED IN MG/24 HOURS) 21 mg/24hr patch Place 1 patch (21 mg total) onto the skin daily. 10/09/17   Georgette Shell, MD  thiamine 100 MG tablet Take 1 tablet (100 mg total) by mouth daily. 10/09/17   Georgette Shell, MD    Family History Family History  Problem Relation Age of Onset  . Dementia Mother   . Arthritis Father   . Asthma Father   . Asthma Brother   . Diabetes Maternal Grandmother   . Dementia Maternal Grandmother   . Diabetes Paternal Grandmother   . Parkinson's disease Maternal Grandfather   . Parkinson's disease  Paternal Grandfather   . Breast cancer Neg Hx   . Colon cancer Neg Hx     Social History Social History   Tobacco Use  . Smoking status: Current Every Day Smoker    Packs/day: 0.50    Types: Cigarettes  . Smokeless tobacco: Never Used  Substance Use Topics  . Alcohol use: Yes    Alcohol/week: 8.0 standard drinks    Types: 8 Glasses of wine per week    Comment: daily (1.5 liter bottle wine every 12)  . Drug use: Yes    Types: Cocaine, Marijuana     Allergies   Patient has no known allergies.   Review of Systems Review of Systems  Gastrointestinal: Positive for  abdominal pain.  Musculoskeletal: Positive for back pain.  All other systems reviewed and are negative.    Physical Exam Updated Vital Signs BP 126/73   Pulse 91   Temp 99.2 F (37.3 C) (Oral)   Resp 16   SpO2 96%   Physical Exam  Constitutional: She is oriented to person, place, and time. She appears well-developed and well-nourished. No distress.  HENT:  Head: Normocephalic and atraumatic.  Right Ear: Hearing normal.  Left Ear: Hearing normal.  Nose: Nose normal.  Mouth/Throat: Oropharynx is clear and moist and mucous membranes are normal.  Eyes: Pupils are equal, round, and reactive to light. Conjunctivae and EOM are normal.  Neck: Normal range of motion. Neck supple.  Cardiovascular: Regular rhythm, S1 normal and S2 normal. Exam reveals no gallop and no friction rub.  No murmur heard. Pulmonary/Chest: Effort normal and breath sounds normal. No respiratory distress. She exhibits no tenderness.  Abdominal: Soft. Normal appearance and bowel sounds are normal. She exhibits distension. There is no hepatosplenomegaly. There is generalized tenderness. There is no rebound, no guarding, no tenderness at McBurney's point and negative Murphy's sign. No hernia.  Musculoskeletal: Normal range of motion.       Lumbar back: She exhibits tenderness.  Neurological: She is alert and oriented to person, place, and time. She has normal strength. No cranial nerve deficit or sensory deficit. Coordination normal. GCS eye subscore is 4. GCS verbal subscore is 5. GCS motor subscore is 6.  Skin: Skin is warm, dry and intact. No rash noted. No cyanosis.  Psychiatric: She has a normal mood and affect. Her speech is normal and behavior is normal. Thought content normal.  Nursing note and vitals reviewed.    ED Treatments / Results  Labs (all labs ordered are listed, but only abnormal results are displayed) Labs Reviewed  CBC WITH DIFFERENTIAL/PLATELET - Abnormal; Notable for the following  components:      Result Value   WBC 13.3 (*)    RBC 3.13 (*)    Hemoglobin 11.0 (*)    HCT 33.3 (*)    MCV 106.4 (*)    MCH 35.1 (*)    Neutro Abs 9.4 (*)    Monocytes Absolute 2.5 (*)    All other components within normal limits  COMPREHENSIVE METABOLIC PANEL - Abnormal; Notable for the following components:   Potassium 2.8 (*)    Chloride 96 (*)    Glucose, Bld 101 (*)    Calcium 8.1 (*)    Total Protein 4.6 (*)    Albumin 2.4 (*)    AST 76 (*)    ALT 55 (*)    Alkaline Phosphatase 164 (*)    Total Bilirubin 1.5 (*)    All other components within normal limits  URINALYSIS, ROUTINE W REFLEX MICROSCOPIC - Abnormal; Notable for the following components:   Hgb urine dipstick SMALL (*)    Ketones, ur 80 (*)    Leukocytes, UA TRACE (*)    All other components within normal limits  AMMONIA - Abnormal; Notable for the following components:   Ammonia 42 (*)    All other components within normal limits  PROTIME-INR  LIPASE, BLOOD    EKG None  Radiology Dg Lumbar Spine Complete  Result Date: 10/09/2017 CLINICAL DATA:  Gait imbalance, fell today. Remote history of spine fracture. EXAM: LUMBAR SPINE - COMPLETE 4+ VIEW COMPARISON:  Lumbar spine radiograph May 01, 2009 FINDINGS: Old mild L1 compression fracture. Remaining lumbar vertebral bodies intact and aligned with maintenance of lumbar lordosis. Intervertebral disc heights preserved. No destructive bony lesions. Moderate RIGHT sacroiliac osteoarthrosis. Small calcifications in the pelvis most likely reflecting leiomyomas. Mild aortoiliac calcifications. IMPRESSION: 1. Old mild L1 compression fracture without acute fracture deformity or malalignment. 2.  Aortic Atherosclerosis (ICD10-I70.0). Electronically Signed   By: Elon Alas M.D.   On: 10/09/2017 03:04    Procedures Procedures (including critical care time)  Medications Ordered in ED Medications  potassium chloride SA (K-DUR,KLOR-CON) CR tablet 40 mEq (has no  administration in time range)     Initial Impression / Assessment and Plan / ED Course  I have reviewed the triage vital signs and the nursing notes.  Pertinent labs & imaging results that were available during my care of the patient were reviewed by me and considered in my medical decision making (see chart for details).     Patient presents to the emergency department for evaluation after a fall.  Patient was hospitalized at [facility] for acute alcoholic pancreatitis and alcoholic ketoacidosis this week, discharged earlier today.  She has not had anything to drink.  She is complaining of low back pain.  X-ray does not show any acute abnormality.  She has no neurologic dysfunction, no foot drop, no saddle anesthesia.  Husband reports that he thinks that she is trying to get morphine.  She is complaining of abdominal pain.  Her abdominal pain, however, is continuous and was present before her hospitalization.  She does not have any focal tenderness or signs of peritonitis.  Lipase has resolved.  Lab work was unremarkable.  Did repeat her examination at 7 AM.  She is now sitting up in the bed and appears comfortable.  Repeat abdominal exam does not reveal any significant change in tenderness, no guarding, rebound or signs of peritonitis.  She reports that she has been urinating frequently.  She thinks is because she was "pumped full of fluid" while she was in the hospital.  Urinalysis does not suggest infection.  I do not see any reason for the patient to have repeat hospitalization at this time.  I am also reluctant to give her any narcotic analgesia.  Final Clinical Impressions(s) / ED Diagnoses   Final diagnoses:  Hypokalemia  Lumbar contusion, initial encounter    ED Discharge Orders    None       Marsena Taff, Gwenyth Allegra, MD 10/09/17 [phone]

## 2017-10-09 NOTE — ED Notes (Signed)
Patient's O2 sat on room air 87-90%, placed on 3L O2 via n.c., O2 sat improved to 97%

## 2017-10-09 NOTE — ED Notes (Signed)
Patient removed supplemental O2, O2 sat 87% on room air, O2 via n.c. Placed back on patient with O2 improvements into mid to high 90s

## 2017-10-09 NOTE — ED Notes (Signed)
ED provider at bedside.

## 2017-10-09 NOTE — ED Notes (Signed)
Pt a/ox4, vss, nad upon discharge. Pt verbalized understanding of all dc instructions, pt discharged home with husband at bedside

## 2017-10-11 ENCOUNTER — Telehealth: Payer: Self-pay

## 2017-10-11 NOTE — Telephone Encounter (Signed)
Attempted to reach patient to complete TCM and schedule hospital f/u. VM full.

## 2017-10-12 NOTE — Telephone Encounter (Signed)
Attempted to reach patient to complete TCM and schedule hospital f/u. VM full.

## 2017-10-14 NOTE — Telephone Encounter (Signed)
Attempted to reach patient to complete TCM and schedule hospital f/u. VM full.

## 2017-11-04 DIAGNOSIS — F1410 Cocaine abuse, uncomplicated: Secondary | ICD-10-CM | POA: Insufficient documentation

## 2017-11-04 DIAGNOSIS — F1210 Cannabis abuse, uncomplicated: Secondary | ICD-10-CM | POA: Insufficient documentation

## 2018-03-10 ENCOUNTER — Encounter (HOSPITAL_COMMUNITY): Payer: Self-pay | Admitting: Emergency Medicine

## 2018-03-10 ENCOUNTER — Other Ambulatory Visit: Payer: Self-pay

## 2018-03-10 ENCOUNTER — Emergency Department (HOSPITAL_COMMUNITY)
Admission: EM | Admit: 2018-03-10 | Discharge: 2018-03-10 | Disposition: A | Discharge status: Home / Self Care | Payer: Self-pay | Attending: Emergency Medicine | Admitting: Emergency Medicine

## 2018-03-10 DIAGNOSIS — Y907 Blood alcohol level of 200-239 mg/100 ml: Secondary | ICD-10-CM | POA: Insufficient documentation

## 2018-03-10 DIAGNOSIS — F17210 Nicotine dependence, cigarettes, uncomplicated: Secondary | ICD-10-CM | POA: Insufficient documentation

## 2018-03-10 DIAGNOSIS — F1020 Alcohol dependence, uncomplicated: Secondary | ICD-10-CM | POA: Insufficient documentation

## 2018-03-10 DIAGNOSIS — F10920 Alcohol use, unspecified with intoxication, uncomplicated: Secondary | ICD-10-CM | POA: Insufficient documentation

## 2018-03-10 DIAGNOSIS — Z79899 Other long term (current) drug therapy: Secondary | ICD-10-CM | POA: Insufficient documentation

## 2018-03-10 DIAGNOSIS — F322 Major depressive disorder, single episode, severe without psychotic features: Secondary | ICD-10-CM | POA: Insufficient documentation

## 2018-03-10 DIAGNOSIS — R45851 Suicidal ideations: Secondary | ICD-10-CM | POA: Insufficient documentation

## 2018-03-10 LAB — COMPREHENSIVE METABOLIC PANEL
ALT: 23 U/L (ref 0–44)
AST: 29 U/L (ref 15–41)
Albumin: 4.3 g/dL (ref 3.5–5.0)
Alkaline Phosphatase: 82 U/L (ref 38–126)
Anion gap: 14 (ref 5–15)
BUN: 13 mg/dL (ref 6–20)
CO2: 22 mmol/L (ref 22–32)
CREATININE: 0.5 mg/dL (ref 0.44–1.00)
Calcium: 9.1 mg/dL (ref 8.9–10.3)
Chloride: 105 mmol/L (ref 98–111)
GFR calc Af Amer: >60 mL/min (ref >60)
Glucose, Bld: 109 mg/dL — ABNORMAL HIGH (ref 70–99)
Potassium: 3.8 mmol/L (ref 3.5–5.1)
Sodium: 141 mmol/L (ref 135–145)
Total Bilirubin: 0.3 mg/dL (ref 0.3–1.2)
Total Protein: 7 g/dL (ref 6.5–8.1)

## 2018-03-10 LAB — I-STAT BETA HCG BLOOD, ED (MC, WL, AP ONLY): I-stat hCG, quantitative: <5 m[IU]/mL (ref <5)

## 2018-03-10 LAB — ETHANOL: Alcohol, Ethyl (B): 224 mg/dL — ABNORMAL HIGH (ref <10)

## 2018-03-10 LAB — SALICYLATE LEVEL: Salicylate Lvl: <7 mg/dL (ref 2.8–30.0)

## 2018-03-10 LAB — RAPID URINE DRUG SCREEN, HOSP PERFORMED
Amphetamines: NOT DETECTED
Barbiturates: NOT DETECTED
Benzodiazepines: POSITIVE — AB
COCAINE: POSITIVE — AB
Opiates: NOT DETECTED
Tetrahydrocannabinol: POSITIVE — AB

## 2018-03-10 LAB — CBC
HCT: 47.4 % — ABNORMAL HIGH (ref 36.0–46.0)
Hemoglobin: 15.3 g/dL — ABNORMAL HIGH (ref 12.0–15.0)
MCH: 32.5 pg (ref 26.0–34.0)
MCHC: 32.3 g/dL (ref 30.0–36.0)
MCV: 100.6 fL — ABNORMAL HIGH (ref 80.0–100.0)
Platelets: 408 10*3/uL — ABNORMAL HIGH (ref 150–400)
RBC: 4.71 MIL/uL (ref 3.87–5.11)
RDW: 13.5 % (ref 11.5–15.5)
WBC: 7.3 10*3/uL (ref 4.0–10.5)
nRBC: 0 % (ref 0.0–0.2)

## 2018-03-10 LAB — ACETAMINOPHEN LEVEL: Acetaminophen (Tylenol), Serum: <10 ug/mL — ABNORMAL LOW (ref 10–30)

## 2018-03-10 MED ORDER — IBUPROFEN 200 MG PO TABS: 600.0000 mg | ORAL_TABLET | Freq: Three times a day (TID) | ORAL | Status: DC | PRN

## 2018-03-10 MED ORDER — LORAZEPAM 1 MG PO TABS
0.0000 mg | ORAL_TABLET | Freq: Four times a day (QID) | ORAL | Status: DC
  Administered 2018-03-10: 1 mg via ORAL
  Filled 2018-03-10: qty 1

## 2018-03-10 MED ORDER — GABAPENTIN 300 MG PO CAPS
300.0000 mg | ORAL_CAPSULE | Freq: Three times a day (TID) | ORAL | Status: DC
  Administered 2018-03-10: 300 mg via ORAL
  Filled 2018-03-10: qty 1

## 2018-03-10 MED ORDER — ALUM & MAG HYDROXIDE-SIMETH 200-200-20 MG/5ML PO SUSP: 30.0000 mL | Freq: Four times a day (QID) | ORAL | Status: DC | PRN

## 2018-03-10 MED ORDER — VITAMIN B-1 100 MG PO TABS
100.0000 mg | ORAL_TABLET | Freq: Every day | ORAL | Status: DC
  Administered 2018-03-10: 100 mg via ORAL
  Filled 2018-03-10: qty 1

## 2018-03-10 MED ORDER — ONDANSETRON HCL 4 MG PO TABS
4.0000 mg | ORAL_TABLET | Freq: Three times a day (TID) | ORAL | Status: DC | PRN
  Administered 2018-03-10: 4 mg via ORAL
  Filled 2018-03-10: qty 1

## 2018-03-10 MED ORDER — NICOTINE 21 MG/24HR TD PT24
21.0000 mg | MEDICATED_PATCH | Freq: Every day | TRANSDERMAL | Status: DC
  Filled 2018-03-10: qty 1

## 2018-03-10 MED ORDER — THIAMINE HCL 100 MG/ML IJ SOLN: 100.0000 mg | Freq: Every day | INTRAMUSCULAR | Status: DC

## 2018-03-10 MED ORDER — LORAZEPAM 2 MG/ML IJ SOLN: 0.0000 mg | Freq: Four times a day (QID) | INTRAMUSCULAR | Status: DC

## 2018-03-10 NOTE — Progress Notes (Signed)
Kathryn Underwood arrived to the unit from the main ED, alert and angry about being here. She was upset and focused on going through withdrawal symptoms. She stated she drinks the entire night. The withdrawal time period was explained to her without a decrease in her anxiety. She continued to insist on leaving. Her voice was loud at intervals.She was given Ativan 1 mg per CIWA protocol.

## 2018-03-10 NOTE — ED Notes (Signed)
Pt requesting another ativan.  Pt states she is "still shaking"  I did not observe any shaking.  Patient is very irritable and wanting to leave.  I asked her to please provide urine and she was unable to urinate at this time.
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
## 2018-03-10 NOTE — Progress Notes (Signed)
Informed nurse, Mechele Claude of pt disposition.

## 2018-03-10 NOTE — Discharge Instructions (Signed)
For your behavioral health needs you are advised to follow up with a therapist at [facility].  New patients are seen at their walk-in clinic.  Walk-in hours are Monday - Friday from 8:00 am - 12:00 pm, and from 1:00 pm - 3:00 pm.  Walk-in patients are seen on a first come, first served basis, so try to arrive as early as possible for the best chance of being seen the same day.  There is an initial fee of $22.50:       [facility], [place] [address]      [phone]

## 2018-03-10 NOTE — ED Notes (Signed)
Pt discharged safely with discharge instructions.  All belongings were returned to patient.

## 2018-03-10 NOTE — ED Notes (Signed)
Bed: WTR5 Expected date:  Expected time:  Means of arrival:  Comments: 
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
## 2018-03-10 NOTE — BH Assessment (Signed)
Tele Assessment Note   Patient Name: Kathryn Underwood MRN: [id number] Referring Physician: Dr. Florina Ou  Location of Patient: [facility]  Location of Provider: [facility]  Kathryn Underwood is an 58 y.o., divorced female. Pt presented to [facility] involuntarily and unaccompanied. Per note, "Patient boyfriend called the sheriff and requested them to come to the house due to patient had a box cutter and was threatening to kill herself. When the sheriff arrived patient went to basement and locked herself in. She told the sheriff that she has pancreatitis and that she was going to go to the store to get alcohol. She said she would continue to do this until she drunk herself to death. Sheriff is bringing IVC papers." Pt denied threats of self harm. Pt stated that she told the sheriff that she has Pancreatitis and she plans to drink, but if it kills her she doesn't care. Pt denied SI/HI/AH/VH. Pt reports drinking a bottle of wine daily. Pt denied current therapist. Pt reports being treated for MM by Dr. Erling Cruz. Pt reports current prescription for Latuda.  Pt reports being unemployed and having no medical insurance. Pt reports living with her boyfriend, her daughter and 2 grandchildren. Pt reports ongoing conflict with her daughter, and now escalating arguments with her boyfriend regarding her daughter. Pt reports 2 adult children. Pt reports pancreatitis. Pt denied legal involvement and pending charges.   Pt refused to provide a collateral contact. Pt presented to ED with BAC of 224. Pt UDS not available at time of assessment.   Pt oriented to person, place, time and situation. Pt presented alert, dressed appropriately and groomed. Pt spoke clearly, coherently and did not seem to be under the influence of any substances. Pt made good eye contact and answered questions appropriately. Pt presented irritable with congruent affect. Pt was mostly calm and open to the assessment process. Pt presented with no  impairments of remote or recent memory that could be detected. Pt did not display positive psychotic symptoms.   Diagnosis:  F32.2 Major depressive disorder, Single episode, Severe F10.20 Alcohol use disorder, Severe   Past Medical History:  Past Medical History:  Diagnosis Date  . Alcohol abuse   . Anxiety   . Chickenpox   . Depression   . Neuromuscular disorder (HCC)    neuropathy  . Substance abuse [facility])     Past Surgical History:  Procedure Laterality Date  . CESAREAN SECTION    . TONSILLECTOMY      Family History:  Family History  Problem Relation Age of Onset  . Dementia Mother   . Arthritis Father   . Asthma Father   . Asthma Brother   . Diabetes Maternal Grandmother   . Dementia Maternal Grandmother   . Diabetes Paternal Grandmother   . Parkinson's disease Maternal Grandfather   . Parkinson's disease Paternal Grandfather   . Breast cancer Neg Hx   . Colon cancer Neg Hx     Social History:  reports that she has been smoking cigarettes. She has been smoking about 0.50 packs per day. She has never used smokeless tobacco. She reports current alcohol use of about 8.0 standard drinks of alcohol per week. She reports current drug use. Drugs: Cocaine and Marijuana.  Additional Social History:  Alcohol / Drug Use Pain Medications: SEE MAR.  Prescriptions: Pt reports being prescribed Remeron.  Over the Counter: SEE MAR.  History of alcohol / drug use?: Yes(Pt reports drinking a bottle of wine daily. ) Longest period of  sobriety (when/how long): 18 Days   CIWA: CIWA-Ar BP: 112/83 Pulse Rate: (!) 118 COWS:    Allergies: No Known Allergies  Home Medications: (Not in a hospital admission)   OB/GYN Status:  No LMP recorded. Patient is postmenopausal.  General Assessment Data Assessment unable to be completed: Yes Reason for not completing assessment: TTS machine has not been located.  Location of Assessment: [facility] ED TTS Assessment: In system Is this a Tele or  Face-to-Face Assessment?: Tele Assessment Is this an Initial Assessment or a Re-assessment for this encounter?: Initial Assessment Patient Accompanied by:: N/A(Pt alone. ) Language Other than English: No Living Arrangements: Other (Comment)(Pt reports living in her boyfriend's home. ) What gender do you identify as?: Female Marital status: Divorced Maiden name: Marcello Moores Pregnancy Status: No Living Arrangements: Spouse/significant other, Children, Other relatives Can pt return to current living arrangement?: Yes Admission Status: Involuntary Petitioner: Other Is patient capable of signing voluntary admission?: Yes Referral Source: Self/Family/Friend Insurance type: Self Pay   Medical Screening Exam ([place]) Medical Exam completed: Yes  Crisis Care Plan Living Arrangements: Spouse/significant other, Children, Other relatives Legal Guardian: Other:(Self ) Name of Psychiatrist: Dr. Erling Cruz  Name of Therapist: Denied  Education Status Is patient currently in school?: No Is the patient employed, unemployed or receiving disability?: Unemployed  Risk to self with the past 6 months Suicidal Ideation: No Has patient been a risk to self within the past 6 months prior to admission? : No Suicidal Intent: No Has patient had any suicidal intent within the past 6 months prior to admission? : No Is patient at risk for suicide?: No Suicidal Plan?: No Has patient had any suicidal plan within the past 6 months prior to admission? : No Access to Means: No What has been your use of drugs/alcohol within the last 12 months?: Pt reports alcohol use daily.  Previous Attempts/Gestures: No How many times?: 0 Other Self Harm Risks: Denied Triggers for Past Attempts: None known Intentional Self Injurious Behavior: None Family Suicide History: No Recent stressful life event(s): Conflict (Comment)(Pt reports conflict with boyfriend and daughter. ) Persecutory voices/beliefs?: No Depression:  Yes Depression Symptoms: Feeling angry/irritable, Despondent, Feeling worthless/self pity Substance abuse history and/or treatment for substance abuse?: Yes Suicide prevention information given to non-admitted patients: Not applicable  Risk to Others within the past 6 months Homicidal Ideation: No Does patient have any lifetime risk of violence toward others beyond the six months prior to admission? : No Thoughts of Harm to Others: No Current Homicidal Intent: No Current Homicidal Plan: No Access to Homicidal Means: No Identified Victim: Denied History of harm to others?: No Assessment of Violence: None Noted Violent Behavior Description: Denied Does patient have access to weapons?: No Criminal Charges Pending?: No Does patient have a court date: No Is patient on probation?: No  Psychosis Hallucinations: None noted Delusions: None noted  Mental Status Report Appearance/Hygiene: Unremarkable Eye Contact: Good Motor Activity: Freedom of movement Speech: Logical/coherent Level of Consciousness: Irritable Mood: Irritable Affect: Appropriate to circumstance Anxiety Level: None Thought Processes: Coherent, Relevant Judgement: Partial Orientation: Person, Place, Time, Situation, Appropriate for developmental age Obsessive Compulsive Thoughts/Behaviors: None  Cognitive Functioning Concentration: Normal Memory: Recent Intact, Remote Intact Is patient IDD: No Insight: Good Impulse Control: Poor Appetite: Fair Have you had any weight changes? : No Change Sleep: No Change Total Hours of Sleep: 5 Vegetative Symptoms: None  ADLScreening [facility] Assessment Services) Patient's cognitive ability adequate to safely complete daily activities?: Yes Patient able to express need  for assistance with ADLs?: Yes Independently performs ADLs?: Yes (appropriate for developmental age)  Prior Inpatient Therapy Prior Inpatient Therapy: Yes Prior Therapy Dates: December 2019 Prior Therapy  Facilty/Provider(s): [facility] Reason for Treatment: Alcohol Use Disorder  Prior Outpatient Therapy Prior Outpatient Therapy: Yes Prior Therapy Dates: Several Years Ago  Prior Therapy Facilty/Provider(s): [facility] Reason for Treatment: Depression Does patient have an ACCT team?: No Does patient have Intensive In-House Services?  : No Does patient have [facility] services? : No Does patient have P4CC services?: No  ADL Screening (condition at time of admission) Patient's cognitive ability adequate to safely complete daily activities?: Yes Is the patient deaf or have difficulty hearing?: No Does the patient have difficulty seeing, even when wearing glasses/contacts?: No Does the patient have difficulty concentrating, remembering, or making decisions?: No Patient able to express need for assistance with ADLs?: Yes Does the patient have difficulty dressing or bathing?: No Independently performs ADLs?: Yes (appropriate for developmental age) Does the patient have difficulty walking or climbing stairs?: No Weakness of Legs: None Weakness of Arms/Hands: None  Home Assistive Devices/Equipment Home Assistive Devices/Equipment: None  Therapy Consults (therapy consults require a physician order) PT Evaluation Needed: No OT Evalulation Needed: No SLP Evaluation Needed: No Abuse/Neglect Assessment (Assessment to be complete while patient is alone) Abuse/Neglect Assessment Can Be Completed: Yes Physical Abuse: Denies Verbal Abuse: Denies Sexual Abuse: Denies Exploitation of patient/patient's resources: Denies Self-Neglect: Denies Values / Beliefs Cultural Requests During Hospitalization: None Spiritual Requests During Hospitalization: None Consults Spiritual Care Consult Needed: No Social Work Consult Needed: No Regulatory affairs officer (For Healthcare) Does Patient Have a Medical Advance Directive?: No Would patient like information on creating a medical advance directive?:  No - Patient declined          Disposition: Per Patriciaann Clan, PA; Pt to be held overnight due to IVC and assessed by [facility] in the morning. AC, Kim informed of pt disposition.  Disposition Initial Assessment Completed for this Encounter: Yes Patient referred to: [facility], Kim informed of pt disposition. )  This service was provided via telemedicine using a 2-way, interactive audio and Radiographer, therapeutic.  Names of all persons participating in this telemedicine service and their role in this encounter. Name: Kathryn Underwood Role: Patient   Name: Jannet Askew  Role: Clinician   Name:  Role:   Name:  Role:    Jannet Askew, M.S., [facility], [place] Triage Specialist [facility] 03/10/2018 3:13 AM

## 2018-03-24 ENCOUNTER — Telehealth (HOSPITAL_BASED_OUTPATIENT_CLINIC_OR_DEPARTMENT_OTHER): Payer: Self-pay | Admitting: Emergency Medicine

## 2018-03-24 ENCOUNTER — Emergency Department (HOSPITAL_BASED_OUTPATIENT_CLINIC_OR_DEPARTMENT_OTHER)
Admission: EM | Admit: 2018-03-24 | Discharge: 2018-03-24 | Disposition: A | Discharge status: Home / Self Care | Payer: Self-pay | Attending: Emergency Medicine | Admitting: Emergency Medicine

## 2018-03-24 ENCOUNTER — Encounter (HOSPITAL_BASED_OUTPATIENT_CLINIC_OR_DEPARTMENT_OTHER): Payer: Self-pay | Admitting: Emergency Medicine

## 2018-03-24 ENCOUNTER — Other Ambulatory Visit: Payer: Self-pay

## 2018-03-24 DIAGNOSIS — F1010 Alcohol abuse, uncomplicated: Secondary | ICD-10-CM | POA: Insufficient documentation

## 2018-03-24 DIAGNOSIS — Z79899 Other long term (current) drug therapy: Secondary | ICD-10-CM | POA: Insufficient documentation

## 2018-03-24 DIAGNOSIS — F17210 Nicotine dependence, cigarettes, uncomplicated: Secondary | ICD-10-CM | POA: Insufficient documentation

## 2018-03-24 MED ORDER — CHLORDIAZEPOXIDE HCL 25 MG PO CAPS: ORAL_CAPSULE | ORAL | 0 refills | Status: DC

## 2018-03-24 MED ORDER — LORAZEPAM 1 MG PO TABS
1.0000 mg | ORAL_TABLET | Freq: Once | ORAL | Status: AC
  Administered 2018-03-24: 1 mg via ORAL
  Filled 2018-03-24: qty 1

## 2018-03-24 MED FILL — CHLORDIAZEPOXIDE 25 MG CAP: 25 | 3 days supply | Qty: 11 | Fill #0

## 2018-03-24 NOTE — ED Triage Notes (Signed)
Reports to ER to detox from alcohol.  Reports 1/5 of vodka lasts her 24 hours.  Last drink this morning.  Recently at [facility] for detox.    States IVC'd 1.5 weeks ago for SI per boyfriend.  Denies SI/HI at present.

## 2018-03-24 NOTE — Telephone Encounter (Signed)
Pt's pharmacy does not have Librium, but [facility] outpatient does have it.

## 2018-03-24 NOTE — ED Provider Notes (Signed)
[place] EMERGENCY DEPARTMENT Provider Note   CSN: [id number] Arrival date & time: 03/24/18  3428    History   Chief Complaint Chief Complaint  Patient presents with  . detox    HPI Kathryn Underwood is a 58 y.o. female.     Pt presents to the ED today wanting inpatient alcohol rehab.  She said she was at [facility] for rehab, but started drinking as soon as she was released.  She was brought to the ED on 2/13 under IVC papers, but was released as she was not suicidal or homicidal.  She said her daughter and grandchildren have been living at her house, and it's been stressful.  The pt denies si/hi now.     Past Medical History:  Diagnosis Date  . Alcohol abuse   . Anxiety   . Chickenpox   . Depression   . Neuromuscular disorder (HCC)    neuropathy  . Substance abuse [facility])     Patient Active Problem List   Diagnosis Date Noted  . Alcohol-induced pancreatitis 10/05/2017  . Alcoholic ketoacidosis 76/81/1572  . Epigastric pain 10/04/2017  . MDD (major depressive disorder), recurrent severe, without psychosis ([place]) 04/28/2016  . Alcohol withdrawal ([name]) 04/25/2016  . Tachycardia 04/25/2016  . Transaminitis 04/25/2016  . Leukocytosis 04/25/2016  . Closed nondisplaced fracture of fifth right metatarsal bone 04/08/2016  . Acute pain of right shoulder 04/08/2016  . Vitamin D deficiency 12/11/2015  . Alcohol dependence with unspecified alcohol-induced disorder ([place]) 12/09/2015  . Fatigue 12/09/2015  . Encounter for alcohol abuse counseling and surveillance 12/09/2015  . Cough 01/04/2015  . Tobacco abuse 01/04/2015  . Depression with anxiety 01/04/2015    Past Surgical History:  Procedure Laterality Date  . CESAREAN SECTION    . TONSILLECTOMY       OB History   No obstetric history on file.      Home Medications    Prior to Admission medications   Medication Sig Start Date End Date Taking? Authorizing Provider  chlordiazePOXIDE (LIBRIUM) 25 MG  capsule 50mg  PO TID x 1D, then 25-50mg  PO BID X 1D, then 25-50mg  PO QD X 1D 03/24/18   Isla Pence, MD  cyclobenzaprine (FLEXERIL) 10 MG tablet Take 1 tablet (10 mg total) by mouth 3 (three) times daily as needed for muscle spasms. Patient not taking: Reported on 03/10/2018 10/09/17   Malvin Johns, MD  folic acid (FOLVITE) 1 MG tablet Take 1 tablet (1 mg total) by mouth daily. Patient not taking: Reported on 03/10/2018 04/29/16   Raiford Noble Latif, DO  gabapentin (NEURONTIN) 300 MG capsule Take 2 capsules (600 mg total) by mouth 2 (two) times daily. Patient not taking: Reported on 03/10/2018 10/09/17   Malvin Johns, MD  MELATONIN PO Take 1-2 tablets by mouth at bedtime as needed (sleep).    [provider]  mirtazapine (REMERON) 30 MG tablet Take 30 mg by mouth at bedtime. 02/05/18   [provider]  Multiple Vitamin (MULTIVITAMIN WITH MINERALS) TABS tablet Take 1 tablet by mouth daily. Patient not taking: Reported on 03/10/2018 10/09/17   Georgette Shell, MD  nicotine (NICODERM CQ - DOSED IN MG/24 HOURS) 21 mg/24hr patch Place 1 patch (21 mg total) onto the skin daily. Patient not taking: Reported on 03/10/2018 10/09/17   Georgette Shell, MD  potassium chloride SA (K-DUR,KLOR-CON) 20 MEQ tablet Take 1 tablet (20 mEq total) by mouth daily. Patient not taking: Reported on 03/10/2018 10/09/17   Malvin Johns, MD  thiamine 100 MG tablet Take 1 tablet (100 mg total) by mouth daily. Patient not taking: Reported on 03/10/2018 10/09/17   Georgette Shell, MD    Family History Family History  Problem Relation Age of Onset  . Dementia Mother   . Arthritis Father   . Asthma Father   . Asthma Brother   . Diabetes Maternal Grandmother   . Dementia Maternal Grandmother   . Diabetes Paternal Grandmother   . Parkinson's disease Maternal Grandfather   . Parkinson's disease Paternal Grandfather   . Breast cancer Neg Hx   . Colon cancer Neg Hx     Social History Social  History   Tobacco Use  . Smoking status: Current Every Day Smoker    Packs/day: 0.50    Types: Cigarettes  . Smokeless tobacco: Never Used  Substance Use Topics  . Alcohol use: Yes    Alcohol/week: 8.0 standard drinks    Types: 8 Glasses of wine per week    Comment: daily (1.5 liter bottle wine every 12)  . Drug use: Yes    Types: Cocaine, Marijuana     Allergies   Patient has no known allergies.   Review of Systems Review of Systems  Psychiatric/Behavioral: Negative for suicidal ideas. The patient is nervous/anxious.   All other systems reviewed and are negative.    Physical Exam Updated Vital Signs BP 126/88 (BP Location: Right Arm)   Pulse (!) 103   Temp 97.7 F (36.5 C) (Oral)   Resp 20   Ht 5\' 3"  (1.6 m)   Wt 68 kg   SpO2 95%   BMI 26.57 kg/m   Physical Exam Vitals signs and nursing note reviewed.  Constitutional:      Appearance: Normal appearance.  HENT:     Head: Normocephalic and atraumatic.     Right Ear: External ear normal.     Left Ear: External ear normal.     Nose: Nose normal.     Mouth/Throat:     Mouth: Mucous membranes are moist.  Eyes:     Extraocular Movements: Extraocular movements intact.     Pupils: Pupils are equal, round, and reactive to light.  Neck:     Musculoskeletal: Normal range of motion and neck supple.  Cardiovascular:     Rate and Rhythm: Normal rate and regular rhythm.     Pulses: Normal pulses.     Heart sounds: Normal heart sounds.  Pulmonary:     Effort: Pulmonary effort is normal.     Breath sounds: Normal breath sounds.  Abdominal:     General: Abdomen is flat.  Musculoskeletal: Normal range of motion.  Skin:    General: Skin is warm.     Capillary Refill: Capillary refill takes less than 2 seconds.  Neurological:     General: No focal deficit present.     Mental Status: She is alert and oriented to person, place, and time.  Psychiatric:        Mood and Affect: Affect is tearful.      ED  Treatments / Results  Labs (all labs ordered are listed, but only abnormal results are displayed) Labs Reviewed - No data to display  EKG None  Radiology No results found.  Procedures Procedures (including critical care time)  Medications Ordered in ED Medications  LORazepam (ATIVAN) tablet 1 mg (has no administration in time range)     Initial Impression / Assessment and Plan / ED Course  I have reviewed the triage vital  signs and the nursing notes.  Pertinent labs & imaging results that were available during my care of the patient were reviewed by me and considered in my medical decision making (see chart for details).       I explained to pt that we don't do inpatient alcohol rehab here.  She will be d/c home with librium and given outpatient and inpatient rehab information.  She knows to return if worse.  F/u with pcp.  Final Clinical Impressions(s) / ED Diagnoses   Final diagnoses:  Alcohol abuse    ED Discharge Orders         Ordered    chlordiazePOXIDE (LIBRIUM) 25 MG capsule     03/24/18 0840           Isla Pence, MD 03/24/18 [phone]

## 2018-08-11 IMAGING — DX DG FOOT COMPLETE 3+V*R*
3 series · 3 of 3 positions shown · non-contrast
Comparison: Right foot series of March 26, 2016

CLINICAL DATA: Status post fall 2 weeks ago, recheck right fifth
metatarsal fracture.

EXAM:
RIGHT FOOT COMPLETE - 3+ VIEW

[foot dp]
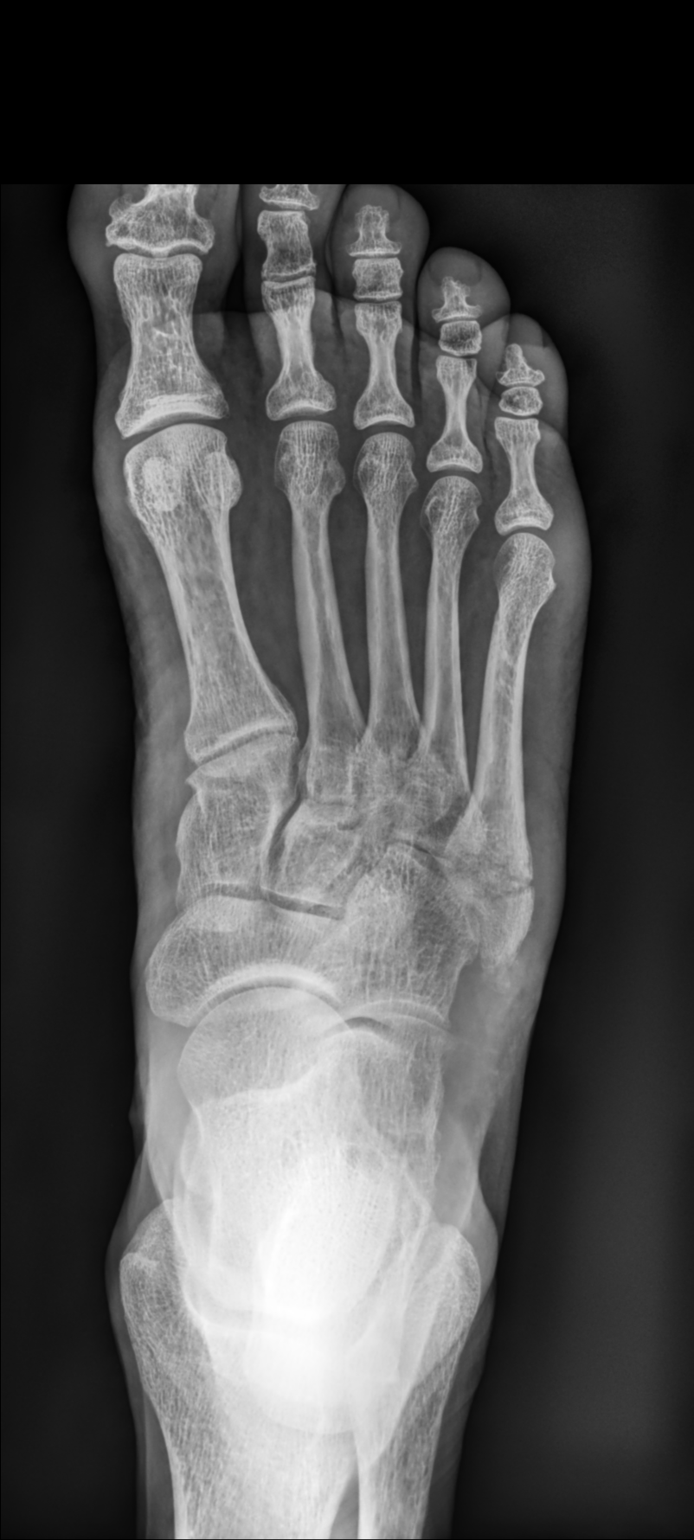

[foot oblique]
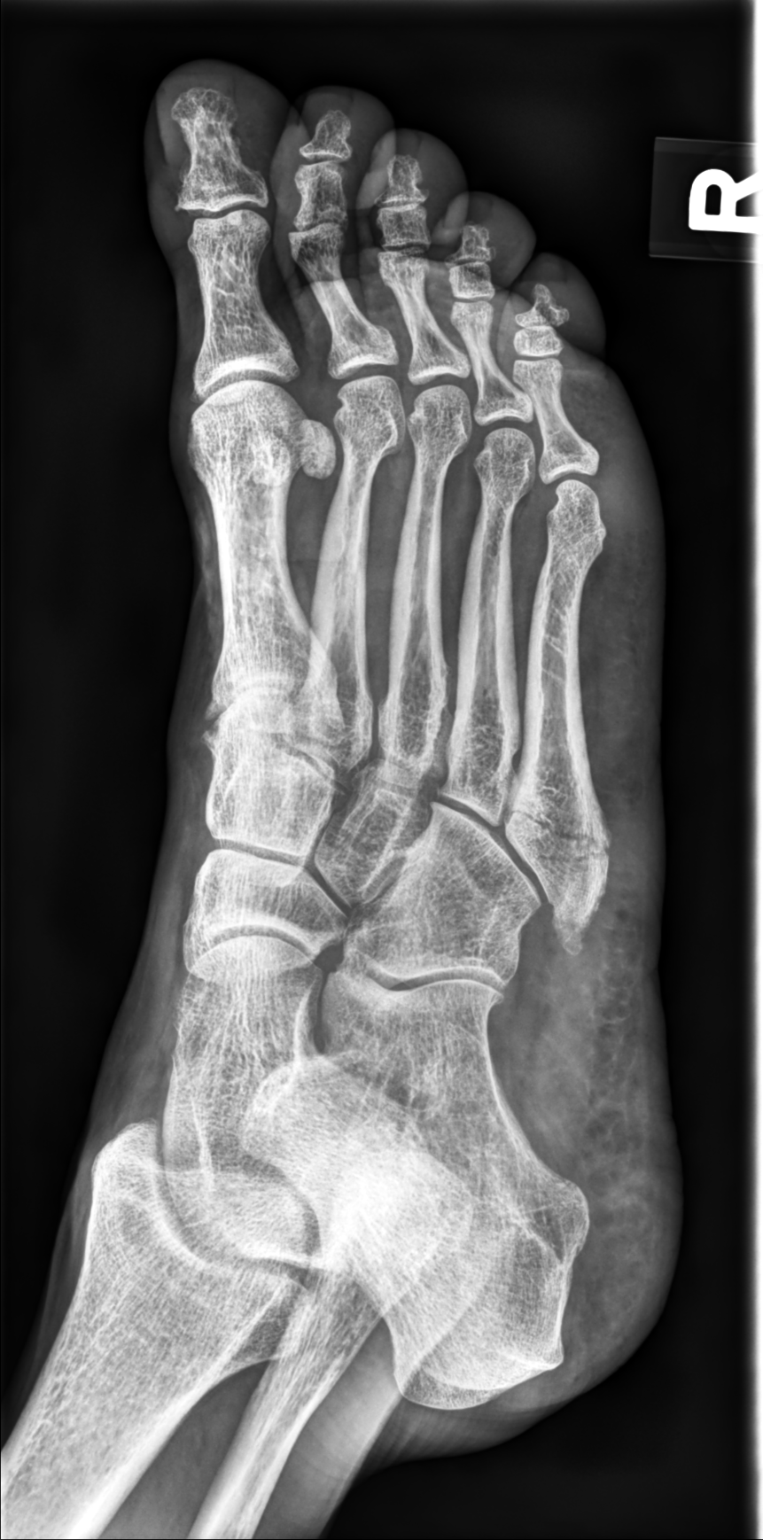

[foot lat]
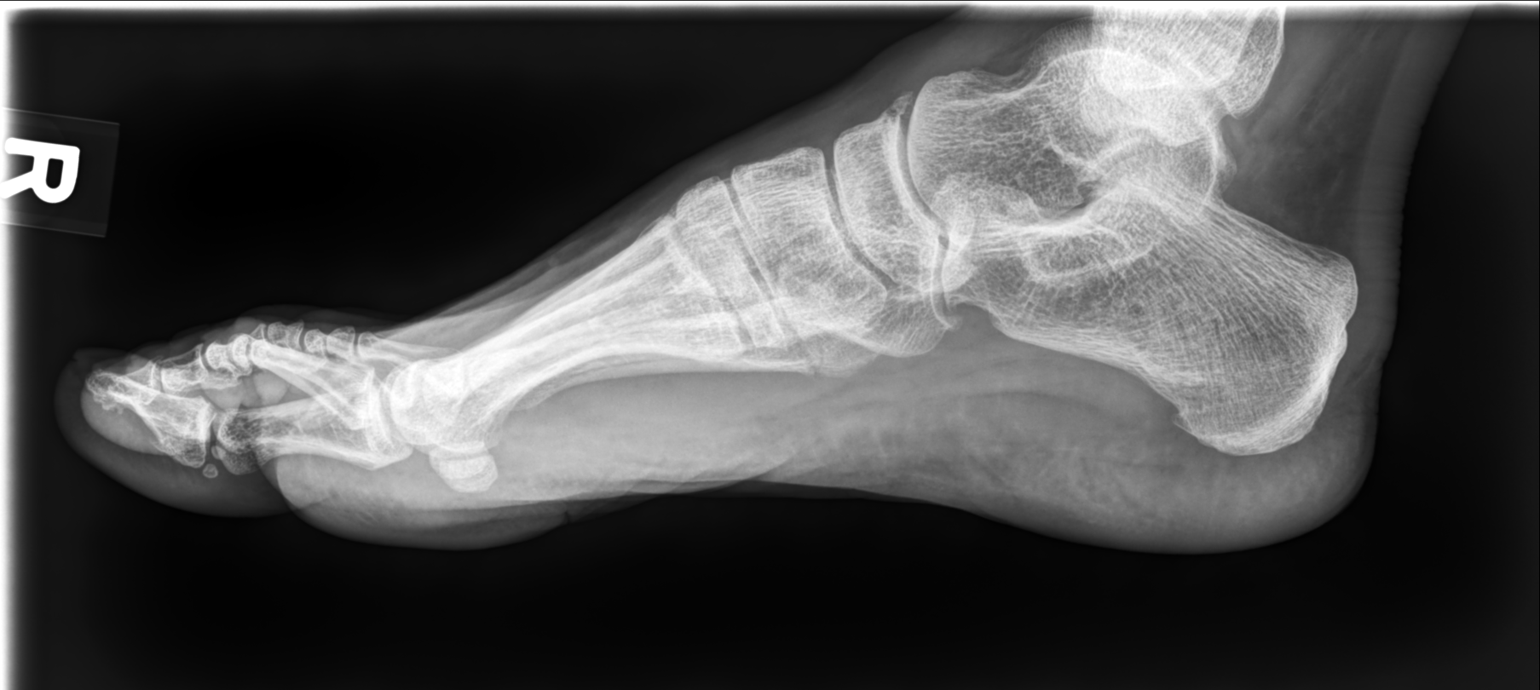

[3 of 3 positions shown; findings below may reference images not displayed]

FINDINGS: Again demonstrated is the transversely oriented comminuted fracture
of the base of the fifth metatarsal. Alignment remains near
anatomic. No periosteal reaction is observed. The fracture lines are
well demonstrated. No other metatarsal fractures are observed. The
phalanges and tarsal bones are intact. The joint spaces are
reasonably well-maintained. There remains a small amount of soft
tissue swelling over the base of the fifth metatarsal.
IMPRESSION: No evidence of bony bridging of the fracture of the base of the
fifth metatarsal. Alignment remains near anatomic.

## 2018-08-25 IMAGING — DX DG FOOT COMPLETE 3+V*R*
3 series · 3 of 3 positions shown · non-contrast
Comparison: 04/08/2016.

CLINICAL DATA: Follow-up fracture.

EXAM:
RIGHT FOOT COMPLETE - 3+ VIEW

[foot dp]
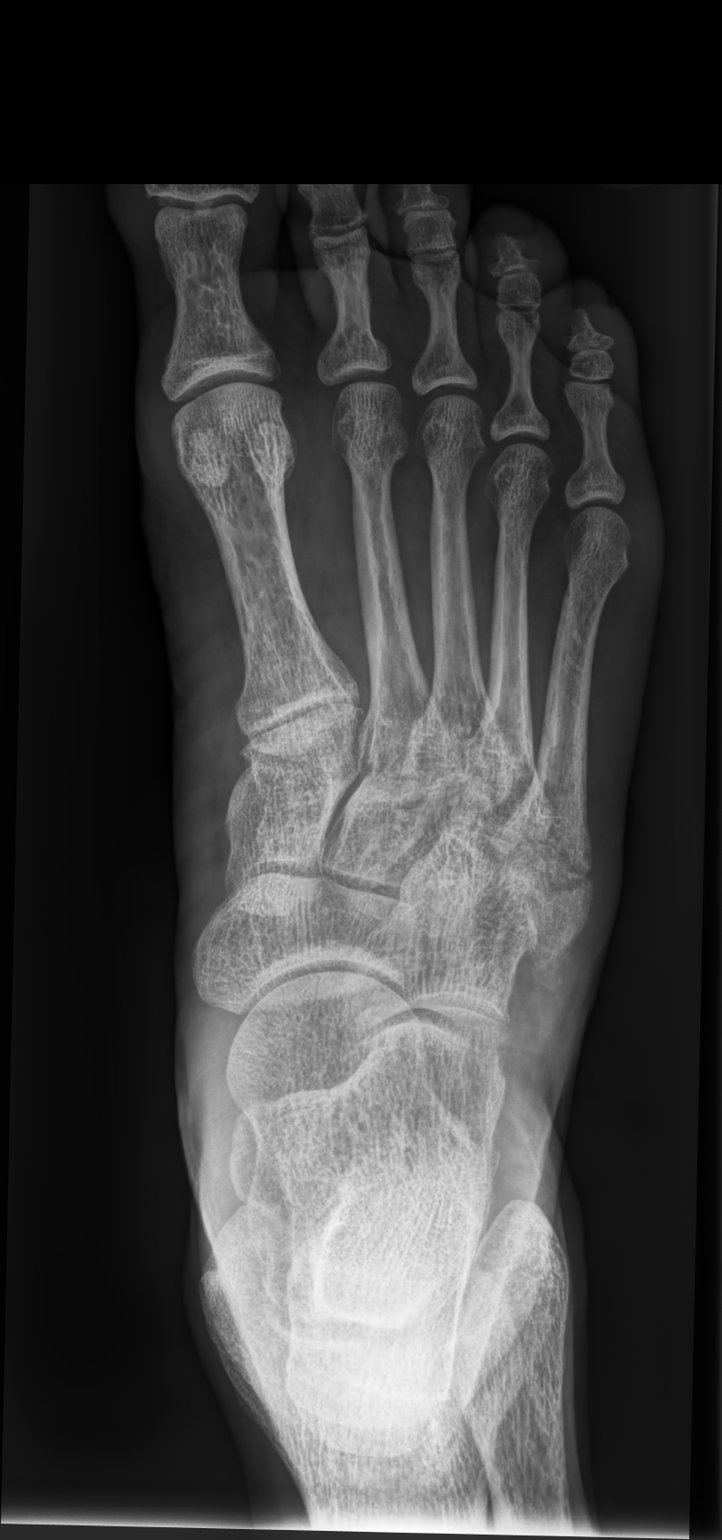

[foot oblique]
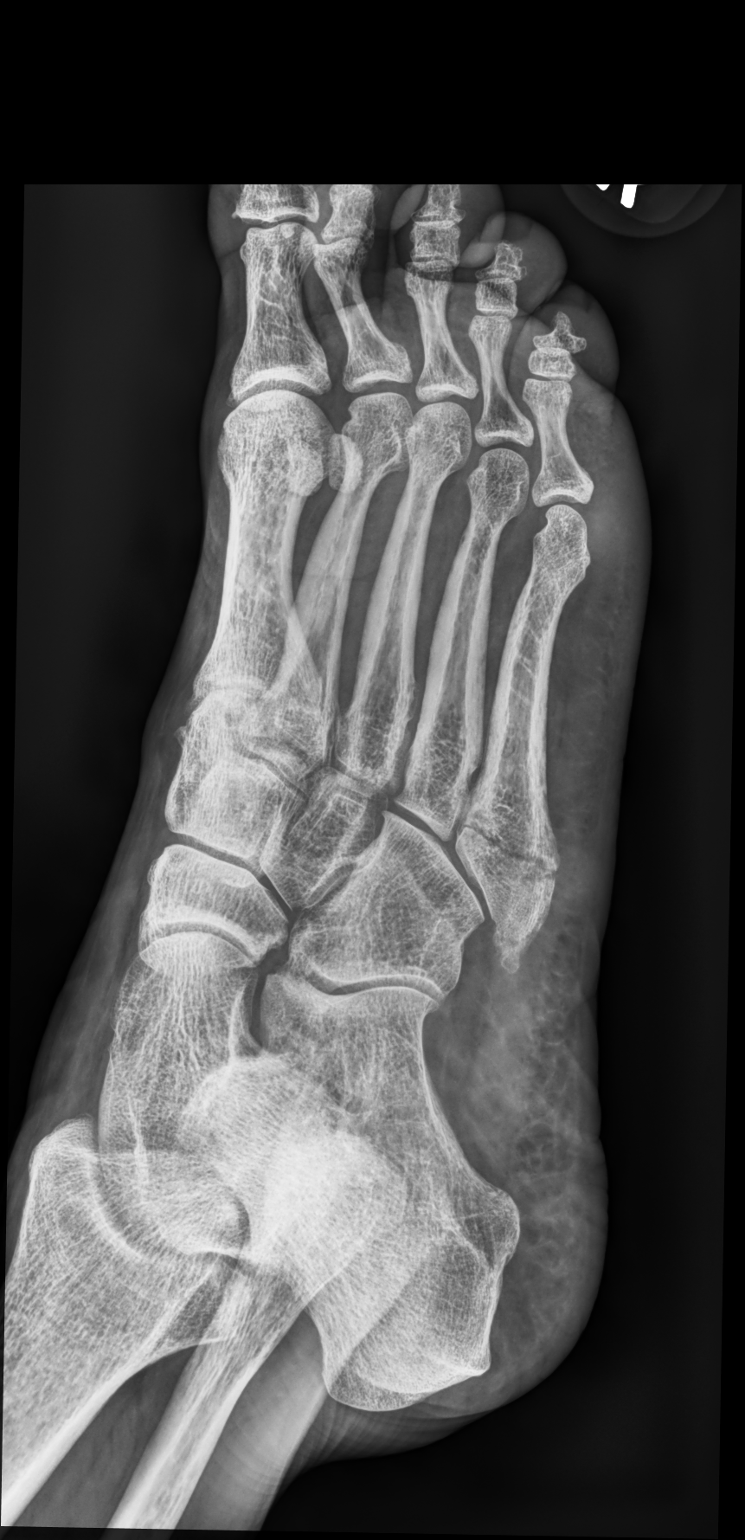

[foot lat]
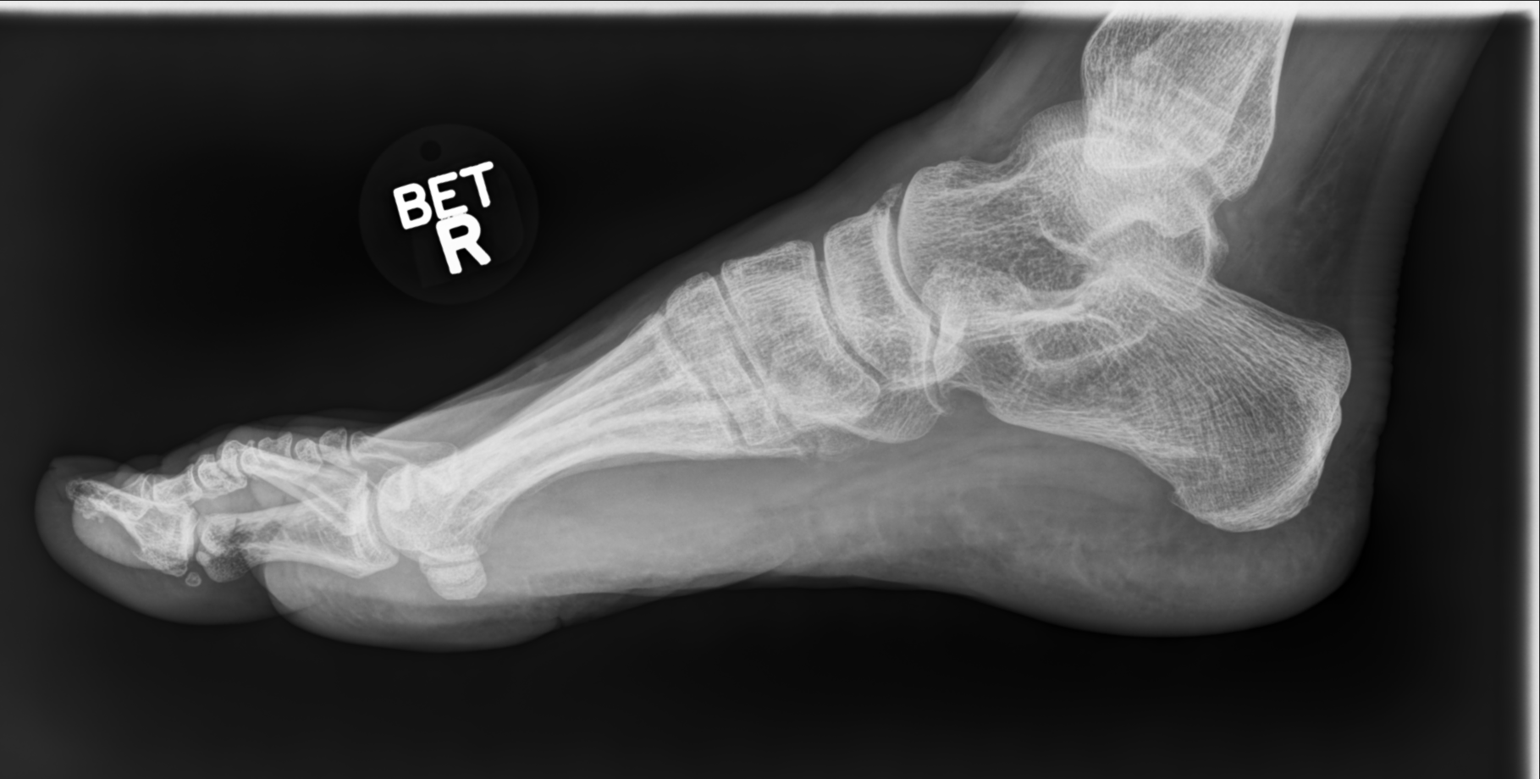

[3 of 3 positions shown; findings below may reference images not displayed]

FINDINGS: Nondisplaced fracture of the base of the right fifth metatarsal is
noted. No interim change in appearance. No prominent callus
formation. Diffuse osteopenia and degenerative change. Osteopenia
may have increased slightly from prior exam. This may be from
disuse.
IMPRESSION: 1. Unchanged fracture of base of the right fifth metatarsal. No
significant callus formation.

2. Diffuse osteopenia and degenerative change. Osteopenia may have
increased slightly from prior exam. This could be from disuse.

## 2018-09-20 ENCOUNTER — Encounter (HOSPITAL_COMMUNITY): Payer: Self-pay | Admitting: Emergency Medicine

## 2018-09-20 ENCOUNTER — Emergency Department (HOSPITAL_COMMUNITY)
Admission: EM | Admit: 2018-09-20 | Discharge: 2018-09-20 | Disposition: A | Discharge status: Home / Self Care | Payer: Self-pay | Attending: Emergency Medicine | Admitting: Emergency Medicine

## 2018-09-20 ENCOUNTER — Emergency Department (HOSPITAL_COMMUNITY): Payer: Self-pay

## 2018-09-20 DIAGNOSIS — F1010 Alcohol abuse, uncomplicated: Secondary | ICD-10-CM | POA: Insufficient documentation

## 2018-09-20 DIAGNOSIS — Z79899 Other long term (current) drug therapy: Secondary | ICD-10-CM | POA: Insufficient documentation

## 2018-09-20 DIAGNOSIS — K852 Alcohol induced acute pancreatitis without necrosis or infection: Secondary | ICD-10-CM | POA: Insufficient documentation

## 2018-09-20 DIAGNOSIS — R7989 Other specified abnormal findings of blood chemistry: Secondary | ICD-10-CM

## 2018-09-20 DIAGNOSIS — K2920 Alcoholic gastritis without bleeding: Secondary | ICD-10-CM | POA: Insufficient documentation

## 2018-09-20 DIAGNOSIS — F17210 Nicotine dependence, cigarettes, uncomplicated: Secondary | ICD-10-CM | POA: Insufficient documentation

## 2018-09-20 DIAGNOSIS — R1010 Upper abdominal pain, unspecified: Secondary | ICD-10-CM

## 2018-09-20 DIAGNOSIS — R945 Abnormal results of liver function studies: Secondary | ICD-10-CM | POA: Insufficient documentation

## 2018-09-20 LAB — CBC
HCT: 48.6 % — ABNORMAL HIGH (ref 36.0–46.0)
HCT: 48.9 % — ABNORMAL HIGH (ref 36.0–46.0)
Hemoglobin: 16.3 g/dL — ABNORMAL HIGH (ref 12.0–15.0)
Hemoglobin: 16.5 g/dL — ABNORMAL HIGH (ref 12.0–15.0)
MCH: 33.6 pg (ref 26.0–34.0)
MCH: 34 pg (ref 26.0–34.0)
MCHC: 33.5 g/dL (ref 30.0–36.0)
MCHC: 33.7 g/dL (ref 30.0–36.0)
MCV: 100.2 fL — ABNORMAL HIGH (ref 80.0–100.0)
MCV: 100.6 fL — ABNORMAL HIGH (ref 80.0–100.0)
Platelets: 307 10*3/uL (ref 150–400)
Platelets: 315 10*3/uL (ref 150–400)
RBC: 4.85 MIL/uL (ref 3.87–5.11)
RBC: 4.86 MIL/uL (ref 3.87–5.11)
RDW: 13.2 % (ref 11.5–15.5)
RDW: 13.1 % (ref 11.5–15.5)
WBC: 5.3 10*3/uL (ref 4.0–10.5)
WBC: 5.3 10*3/uL (ref 4.0–10.5)
nRBC: 0 % (ref 0.0–0.2)
nRBC: 0 % (ref 0.0–0.2)

## 2018-09-20 LAB — RAPID URINE DRUG SCREEN, HOSP PERFORMED
Amphetamines: NOT DETECTED
Barbiturates: NOT DETECTED
Benzodiazepines: NOT DETECTED
Cocaine: POSITIVE — AB
Opiates: NOT DETECTED
Tetrahydrocannabinol: POSITIVE — AB

## 2018-09-20 LAB — COMPREHENSIVE METABOLIC PANEL
ALT: 48 U/L — ABNORMAL HIGH (ref 0–44)
AST: 84 U/L — ABNORMAL HIGH (ref 15–41)
Albumin: 4.3 g/dL (ref 3.5–5.0)
Alkaline Phosphatase: 70 U/L (ref 38–126)
Anion gap: 13 (ref 5–15)
BUN: 6 mg/dL (ref 6–20)
CO2: 23 mmol/L (ref 22–32)
Calcium: 9.3 mg/dL (ref 8.9–10.3)
Chloride: 105 mmol/L (ref 98–111)
Creatinine, Ser: 0.62 mg/dL (ref 0.44–1.00)
GFR calc Af Amer: >60 mL/min (ref >60)
GFR calc non Af Amer: >60 mL/min (ref >60)
Glucose, Bld: 113 mg/dL — ABNORMAL HIGH (ref 70–99)
Potassium: 4.4 mmol/L (ref 3.5–5.1)
Sodium: 141 mmol/L (ref 135–145)
Total Bilirubin: 0.8 mg/dL (ref 0.3–1.2)
Total Protein: 7.1 g/dL (ref 6.5–8.1)

## 2018-09-20 LAB — DIFFERENTIAL
Abs Immature Granulocytes: 0.01 10*3/uL (ref 0.00–0.07)
Basophils Absolute: 0.1 10*3/uL (ref 0.0–0.1)
Basophils Relative: 2 %
Eosinophils Absolute: 0.1 10*3/uL (ref 0.0–0.5)
Eosinophils Relative: 1 %
Immature Granulocytes: 0 %
Lymphocytes Relative: 31 %
Lymphs Abs: 1.7 10*3/uL (ref 0.7–4.0)
Monocytes Absolute: 0.4 10*3/uL (ref 0.1–1.0)
Monocytes Relative: 8 %
Neutro Abs: 3.1 10*3/uL (ref 1.7–7.7)
Neutrophils Relative %: 58 %

## 2018-09-20 LAB — LIPASE, BLOOD: Lipase: 20 U/L (ref 11–51)

## 2018-09-20 MED ORDER — LORAZEPAM 2 MG/ML IJ SOLN
0.0000 mg | Freq: Four times a day (QID) | INTRAMUSCULAR | Status: DC
  Administered 2018-09-20: 21:00:00 2 mg via INTRAVENOUS
  Filled 2018-09-20: qty 1

## 2018-09-20 MED ORDER — PANTOPRAZOLE SODIUM 20 MG PO TBEC: 20.0000 mg | DELAYED_RELEASE_TABLET | Freq: Every day | ORAL | 0 refills | Status: DC

## 2018-09-20 MED ORDER — LORAZEPAM 2 MG/ML IJ SOLN: 0.0000 mg | Freq: Two times a day (BID) | INTRAMUSCULAR | Status: DC

## 2018-09-20 MED ORDER — ALUM & MAG HYDROXIDE-SIMETH 200-200-20 MG/5ML PO SUSP
30.0000 mL | Freq: Once | ORAL | Status: AC
  Administered 2018-09-20: 19:00:00 30 mL via ORAL
  Filled 2018-09-20: qty 30

## 2018-09-20 MED ORDER — PANTOPRAZOLE SODIUM 40 MG IV SOLR
40.0000 mg | Freq: Once | INTRAVENOUS | Status: AC
  Administered 2018-09-20: 40 mg via INTRAVENOUS
  Filled 2018-09-20: qty 40

## 2018-09-20 MED ORDER — LORAZEPAM 1 MG PO TABS: 0.0000 mg | ORAL_TABLET | Freq: Two times a day (BID) | ORAL | Status: DC

## 2018-09-20 MED ORDER — ONDANSETRON HCL 4 MG/2ML IJ SOLN
4.0000 mg | Freq: Once | INTRAMUSCULAR | Status: AC
  Administered 2018-09-20: 19:00:00 4 mg via INTRAVENOUS
  Filled 2018-09-20: qty 2

## 2018-09-20 MED ORDER — VITAMIN B-1 100 MG PO TABS
100.0000 mg | ORAL_TABLET | Freq: Every day | ORAL | Status: DC
  Administered 2018-09-20: 100 mg via ORAL
  Filled 2018-09-20: qty 1

## 2018-09-20 MED ORDER — SODIUM CHLORIDE 0.9 % IV BOLUS
1000.0000 mL | Freq: Once | INTRAVENOUS | Status: AC
  Administered 2018-09-20: 1000 mL via INTRAVENOUS

## 2018-09-20 MED ORDER — LORAZEPAM 1 MG PO TABS: 0.0000 mg | ORAL_TABLET | Freq: Four times a day (QID) | ORAL | Status: DC

## 2018-09-20 MED ORDER — SUCRALFATE 1 GM/10ML PO SUSP: 1.0000 g | Freq: Three times a day (TID) | ORAL | 0 refills | Status: DC

## 2018-09-20 MED ORDER — THIAMINE HCL 100 MG/ML IJ SOLN: 100.0000 mg | Freq: Every day | INTRAMUSCULAR | Status: DC

## 2018-09-20 MED ORDER — LIDOCAINE VISCOUS HCL 2 % MT SOLN
15.0000 mL | Freq: Once | OROMUCOSAL | Status: AC
  Administered 2018-09-20: 15 mL via ORAL
  Filled 2018-09-20: qty 15

## 2018-09-20 NOTE — ED Notes (Signed)
Patient verbalizes understanding of discharge instructions. Opportunity for questioning and answers were provided. pt discharged from ED. Ambulatory by self  

## 2018-09-20 NOTE — ED Notes (Signed)
Pt returned from US

## 2018-09-20 NOTE — ED Notes (Signed)
Patient transported to X-ray 

## 2018-09-20 NOTE — Discharge Instructions (Addendum)
Stop drinking alcohol. Take Protonix daily. Use Carafate before meals and before bed to help decrease stomach pain. There is information about outpatient counseling/substance abuse programs in the area. Return to the emergency room with any new, worsening, concerning symptoms.

## 2018-09-20 NOTE — ED Triage Notes (Signed)
Pt reports being a alcoholic and having chronic pancreatitis pt states she has been drinking a large bottle of wine daily and last drink was this am around 1030. Pt is having upper abd pt with emesis

## 2018-09-20 NOTE — ED Provider Notes (Signed)
[facility] DEPART[facility] Provider Note   CSN: [id number] Arrival date & time: 09/20/18  1149     History   Chief Complaint Chief Complaint  Patient presents with  . Alcohol Problem  . Abdominal Pain    HPI Kathryn Underwood is a 58 y.o. female presenting for evaluation of abdominal pain, nausea, vomiting.  Patient states the past 2 days, she has been having severe upper abdominal pain which radiates to her back.  She reports associated nausea vomiting, vomiting about 5 times a day.  She denies hematemesis.  Pain is constant, worse when she eats or drinks.  Patient states she normally drinks 1/5 of liquor a day, but she has decreased it to 1 big bottle of wine a day since her pain started.  Patient states her vomiting began after her pain.  She reports a history of pancreatitis last year, and this feels similar.  She denies fevers, chills, chest pain, shortness of breath, cough, urinary symptoms, normal bowel movements.  She denies sick contacts.  She reports no previous abdominal problems or surgeries.  She has a history of gabapentin, alcohol abuse, depression.     HPI  Past Medical History:  Diagnosis Date  . Alcohol abuse   . Anxiety   . Chickenpox   . Depression   . Neuromuscular disorder (HCC)    neuropathy  . Substance abuse [facility])     Patient Active Problem List   Diagnosis Date Noted  . Alcohol-induced pancreatitis 10/05/2017  . Alcoholic ketoacidosis [id number]  . Epigastric pain 10/04/2017  . MDD (major depressive disorder), recurrent severe, without psychosis ([place]) 04/28/2016  . Alcohol withdrawal ([place]) 04/25/2016  . Tachycardia 04/25/2016  . Transaminitis 04/25/2016  . Leukocytosis 04/25/2016  . Closed nondisplaced fracture of fifth right metatarsal bone 04/08/2016  . Acute pain of right shoulder 04/08/2016  . Vitamin D deficiency 12/11/2015  . Alcohol dependence with unspecified alcohol-induced disorder ([place]) 12/09/2015  . Fatigue  12/09/2015  . Encounter for alcohol abuse counseling and surveillance 12/09/2015  . Cough 01/04/2015  . Tobacco abuse 01/04/2015  . Depression with anxiety 01/04/2015    Past Surgical History:  Procedure Laterality Date  . CESAREAN SECTION    . TONSILLECTOMY       OB History   No obstetric history on file.      Home Medications    Prior to Admission medications   Medication Sig Start Date End Date Taking? Authorizing Provider  chlordiazePOXIDE (LIBRIUM) 25 MG capsule 50mg  PO TID x 1D, then 25-50mg  PO BID X 1D, then 25-50mg  PO QD X 1D 03/24/18   Isla Pence, MD  cyclobenzaprine (FLEXERIL) 10 MG tablet Take 1 tablet (10 mg total) by mouth 3 (three) times daily as needed for muscle spasms. Patient not taking: Reported on 03/10/2018 10/09/17   Malvin Johns, MD  folic acid (FOLVITE) 1 MG tablet Take 1 tablet (1 mg total) by mouth daily. Patient not taking: Reported on 03/10/2018 04/29/16   Raiford Noble Latif, DO  gabapentin (NEURONTIN) 300 MG capsule Take 2 capsules (600 mg total) by mouth 2 (two) times daily. Patient not taking: Reported on 03/10/2018 10/09/17   Malvin Johns, MD  MELATONIN PO Take 1-2 tablets by mouth at bedtime as needed (sleep).    [provider]  mirtazapine (REMERON) 30 MG tablet Take 30 mg by mouth at bedtime. 02/05/18   [provider]  Multiple Vitamin (MULTIVITAMIN WITH MINERALS) TABS tablet Take 1 tablet by mouth daily. Patient not  taking: Reported on 03/10/2018 10/09/17   Georgette Shell, MD  nicotine (NICODERM CQ - DOSED IN MG/24 HOURS) 21 mg/24hr patch Place 1 patch (21 mg total) onto the skin daily. Patient not taking: Reported on 03/10/2018 10/09/17   Georgette Shell, MD  pantoprazole (PROTONIX) 20 MG tablet Take 1 tablet (20 mg total) by mouth daily. 09/20/18   Takeila Thayne, PA-C  potassium chloride SA (K-DUR,KLOR-CON) 20 MEQ tablet Take 1 tablet (20 mEq total) by mouth daily. Patient not taking: Reported on 03/10/2018  10/09/17   Malvin Johns, MD  sucralfate (CARAFATE) 1 GM/10ML suspension Take 10 mLs (1 g total) by mouth 4 (four) times daily -  with meals and at bedtime. 09/20/18   Rea Reser, PA-C  thiamine 100 MG tablet Take 1 tablet (100 mg total) by mouth daily. Patient not taking: Reported on 03/10/2018 10/09/17   Georgette Shell, MD    Family History Family History  Problem Relation Age of Onset  . Dementia Mother   . Arthritis Father   . Asthma Father   . Asthma Brother   . Diabetes Maternal Grandmother   . Dementia Maternal Grandmother   . Diabetes Paternal Grandmother   . Parkinson's disease Maternal Grandfather   . Parkinson's disease Paternal Grandfather   . Breast cancer Neg Hx   . Colon cancer Neg Hx     Social History Social History   Tobacco Use  . Smoking status: Current Every Day Smoker    Packs/day: 0.50    Types: Cigarettes  . Smokeless tobacco: Never Used  Substance Use Topics  . Alcohol use: Yes    Alcohol/week: 8.0 standard drinks    Types: 8 Glasses of wine per week    Comment: daily (1.5 liter bottle wine every 12)  . Drug use: Yes    Types: Cocaine, Marijuana     Allergies   Patient has no known allergies.   Review of Systems Review of Systems  Gastrointestinal: Positive for abdominal pain, nausea and vomiting.  Psychiatric/Behavioral: The patient is nervous/anxious.   All other systems reviewed and are negative.    Physical Exam Updated Vital Signs BP (!) 140/97 (BP Location: Left Arm)   Pulse (!) 105   Temp 98 F (36.7 C) (Oral)   Resp 20   SpO2 96%   Physical Exam Vitals signs and nursing note reviewed.  Constitutional:      General: She is not in acute distress.    Appearance: She is well-developed.     Comments: Obese female who appears anxious but nontoxic.  HENT:     Head: Normocephalic and atraumatic.  Eyes:     Conjunctiva/sclera: Conjunctivae normal.     Pupils: Pupils are equal, round, and reactive to light.   Neck:     Musculoskeletal: Normal range of motion and neck supple.  Cardiovascular:     Rate and Rhythm: Regular rhythm. Tachycardia present.     Pulses: Normal pulses.     Comments: Mildly tachycardic around 105 Pulmonary:     Effort: Pulmonary effort is normal. No respiratory distress.     Breath sounds: Normal breath sounds. No wheezing.     Comments: Clear lung sounds in all fields Abdominal:     General: There is no distension.     Palpations: Abdomen is soft. There is no mass.     Tenderness: There is abdominal tenderness. There is no guarding or rebound.     Comments: Tenderness palpation of upper abdomen bilaterally.  No rigidity, guarding, distention.  Negative rebound.  No CVA tenderness.  Musculoskeletal: Normal range of motion.     Comments: No leg pain and swelling  Skin:    General: Skin is warm and dry.     Capillary Refill: Capillary refill takes less than 2 seconds.  Neurological:     Mental Status: She is alert and oriented to person, place, and time.      ED Treatments / Results  Labs (all labs ordered are listed, but only abnormal results are displayed) Labs Reviewed  COMPREHENSIVE METABOLIC PANEL - Abnormal; Notable for the following components:      Result Value   Glucose, Bld 113 (*)    AST 84 (*)    ALT 48 (*)    All other components within normal limits  CBC - Abnormal; Notable for the following components:   Hemoglobin 16.3 (*)    HCT 48.6 (*)    MCV 100.2 (*)    All other components within normal limits  RAPID URINE DRUG SCREEN, HOSP PERFORMED - Abnormal; Notable for the following components:   Cocaine POSITIVE (*)    Tetrahydrocannabinol POSITIVE (*)    All other components within normal limits  CBC - Abnormal; Notable for the following components:   Hemoglobin 16.5 (*)    HCT 48.9 (*)    MCV 100.6 (*)    All other components within normal limits  DIFFERENTIAL  LIPASE, BLOOD  ETHANOL  I-STAT BETA HCG BLOOD, ED ([facility], [facility], [facility] ONLY)     EKG None  Radiology Dg Abdomen Acute W/chest  Result Date: 09/20/2018 CLINICAL DATA:  Nausea, vomiting EXAM: DG ABDOMEN ACUTE W/ 1V CHEST COMPARISON:  None. FINDINGS: There is no evidence of dilated bowel loops or free intraperitoneal air. No radiopaque calculi or other significant radiographic abnormality is seen. Heart size and mediastinal contours are within normal limits. Both lungs are clear. IMPRESSION: Negative abdominal radiographs.  No acute cardiopulmonary disease. Electronically Signed   By: Rolm Baptise M.D.   On: 09/20/2018 19:03   US Abdomen Limited Ruq  Result Date: 09/20/2018 CLINICAL DATA:  Upper abdominal pain. EXAM: ULTRASOUND ABDOMEN LIMITED RIGHT UPPER QUADRANT COMPARISON:  None. FINDINGS: Gallbladder: No gallstones or wall thickening visualized. No sonographic Murphy sign noted by sonographer. Common bile duct: Diameter: 4 mm which is within normal limits. Liver: No focal lesion identified. Increased echogenicity of hepatic parenchyma is noted suggesting hepatic steatosis. Portal vein is patent on color Doppler imaging with normal direction of blood flow towards the liver. Other: None. IMPRESSION: Findings most consistent with hepatic steatosis. No other abnormality seen in the right upper quadrant of the abdomen. Electronically Signed   By: Marijo Conception M.D.   On: 09/20/2018 21:01    Procedures Procedures (including critical care time)  Medications Ordered in ED Medications  LORazepam (ATIVAN) injection 0-4 mg (2 mg Intravenous Given 09/20/18 2035)    Or  LORazepam (ATIVAN) tablet 0-4 mg ( Oral See Alternative 09/20/18 2035)  LORazepam (ATIVAN) injection 0-4 mg (has no administration in time range)    Or  LORazepam (ATIVAN) tablet 0-4 mg (has no administration in time range)  thiamine (VITAMIN B-1) tablet 100 mg (100 mg Oral Given 09/20/18 2035)    Or  thiamine (B-1) injection 100 mg ( Intravenous See Alternative 09/20/18 2035)  sodium chloride 0.9 % bolus 1,000 mL  (0 mLs Intravenous Stopped 09/20/18 2235)  pantoprazole (PROTONIX) injection 40 mg (40 mg Intravenous Given 09/20/18 1833)  ondansetron (ZOFRAN) injection  4 mg (4 mg Intravenous Given 09/20/18 1832)  alum & mag hydroxide-simeth (MAALOX/MYLANTA) 200-200-20 MG/5ML suspension 30 mL (30 mLs Oral Given 09/20/18 1917)    And  lidocaine (XYLOCAINE) 2 % viscous mouth solution 15 mL (15 mLs Oral Given 09/20/18 1834)     Initial Impression / Assessment and Plan / ED Course  I have reviewed the triage vital signs and the nursing notes.  Pertinent labs & imaging results that were available during my care of the patient were reviewed by me and considered in my medical decision making (see chart for details).        Patient presenting for evaluation of abdominal pain, nausea, vomiting.  Physical exam reassuring, she appears nontoxic.  Consider pancreatitis versus gastritis versus gallbladder etiology vs PUD.  Labs obtained from triage showed no leukocytosis.  Slightly hemoconcentrated at 16.5.  LFTs slightly elevated, although similar to baseline.  Lipase negative.  Will give Zofran, Protonix, GI cocktail, and fluids and reassess. Acute abd xray to r/o perf, although low suspicion at this time.   X-ray viewed interpreted by me, no for acute abnormalities.  On reassessment, patient reports nausea is resolved.  Pain is improving, but still radiating to her back.  She remains tachycardic, but has not received any fluids yet.  Considering continued pain which radiates to her back and elevated LFTs, will obtain ultrasound to r/o gallbladder abnormality.  Ultrasound negative for gallbladder etiology, does show hepatic steatosis.  On reassessment, patient reports pain is improved. HR upper 90's on my exam. Discussed symptomatic treatment at home and importance of cessation of alcohol.  Encourage follow-up with outpatient detox center.  At this time, patient appears safe for discharge.  Return precautions given.  Patient  states she understands and agrees to plan.   Final Clinical Impressions(s) / ED Diagnoses   Final diagnoses:  Upper abdominal pain  Alcohol abuse  Alcoholic gastritis, presence of bleeding unspecified, unspecified chronicity  Elevated LFTs    ED Discharge Orders         Ordered    sucralfate (CARAFATE) 1 GM/10ML suspension  3 times daily with meals & bedtime     09/20/18 2220    pantoprazole (PROTONIX) 20 MG tablet  Daily     09/20/18 2220           Franchot Heidelberg, PA-C 09/21/18 0016    Little, Wenda Overland, MD 09/21/18 1456

## 2021-07-22 ENCOUNTER — Ambulatory Visit: Payer: Self-pay | Admitting: Physician Assistant

## 2021-07-23 ENCOUNTER — Ambulatory Visit (INDEPENDENT_AMBULATORY_CARE_PROVIDER_SITE_OTHER): Payer: Self-pay | Admitting: Physician Assistant

## 2021-07-23 ENCOUNTER — Encounter: Payer: Self-pay | Admitting: Physician Assistant

## 2021-07-23 VITALS — BP 93/68 | HR 127 | Temp 97.7°F | Ht 63.0 in | Wt 141.0 lb

## 2021-07-23 DIAGNOSIS — R000 Tachycardia, unspecified: Secondary | ICD-10-CM

## 2021-07-23 DIAGNOSIS — R198 Other specified symptoms and signs involving the digestive system and abdomen: Secondary | ICD-10-CM

## 2021-07-23 DIAGNOSIS — F1029 Alcohol dependence with unspecified alcohol-induced disorder: Secondary | ICD-10-CM

## 2021-07-23 NOTE — Patient Instructions (Signed)
Please go to [facility] Department at [facility] for urgent evaluation of your abdominal concerns.

## 2021-07-23 NOTE — Progress Notes (Signed)
Subjective:    Patient ID: Kathryn Underwood, female    DOB: 1960/10/31, 61 y.o.   MRN: [id number]  Chief Complaint  Patient presents with   Establish Care   GI Problem   Diarrhea   Depression    HPI 61 y.o. patient presents today for new patient establishment with me.  Patient was previously established with Dr. Howard Pouch, over 5 years ago per patient.   Acute Concerns: Feels "full of air" in her gut, last few weeks, worse in the last week, very tight, belly protruding -No appetite -Extreme, sharp pains, tight feeling in abdomen, black, loose stools - says she was taking pepto-bismol at least twice daily in the beginning, hasn't had any in a few days  -Worse after eating pizza a few days ago -No dizziness, unsure about any weight changes as she doesn't weight herself -Some nausea / nervous feeling, no vomiting -Hx pancreatitis in 2017, doesn't feel like that to the patient -Father passed away 3 weeks ago and mother's twin passed a few days later  Past Medical History:  Diagnosis Date   Alcohol abuse    Anxiety    Chickenpox    Depression    Neuromuscular disorder ([place])    neuropathy   Substance abuse ([place])     Past Surgical History:  Procedure Laterality Date   CESAREAN SECTION     TONSILLECTOMY      Family History  Problem Relation Age of Onset   Dementia Mother    Arthritis Father    Asthma Father    Asthma Brother    Diabetes Maternal Grandmother    Dementia Maternal Grandmother    Diabetes Paternal Grandmother    Parkinson's disease Maternal Grandfather    Parkinson's disease Paternal Grandfather    Breast cancer Neg Hx    Colon cancer Neg Hx     Social History   Tobacco Use   Smoking status: Every Day    Packs/day: 0.50    Types: Cigarettes   Smokeless tobacco: Never  Substance Use Topics   Alcohol use: Yes    Alcohol/week: 8.0 standard drinks of alcohol    Types: 8 Glasses of wine per week    Comment: daily (1.5 liter bottle wine every  12)   Drug use: Yes    Types: Cocaine, Marijuana     No Known Allergies  Review of Systems NEGATIVE UNLESS OTHERWISE INDICATED IN HPI      Objective:     BP 93/68   Pulse (!) 127   Temp 97.7 F (36.5 C) (Temporal)   Ht '5\' 3"'$  (1.6 m)   Wt 141 lb (64 kg)   SpO2 98%   BMI 24.98 kg/m   Wt Readings from Last 3 Encounters:  07/23/21 141 lb (64 kg)  03/24/18 150 lb (68 kg)  03/10/18 150 lb (68 kg)    BP Readings from Last 3 Encounters:  07/23/21 93/68  09/20/18 (!) 140/97  03/24/18 126/88     Physical Exam Constitutional:      General: She is in acute distress (appears stressed).     Appearance: Normal appearance. She is normal weight.  Cardiovascular:     Rate and Rhythm: Tachycardia present.     Pulses: Normal pulses.     Heart sounds: No murmur heard. Pulmonary:     Effort: Pulmonary effort is normal.     Breath sounds: Normal breath sounds.  Abdominal:     General: There is distension.  Tenderness: There is abdominal tenderness (diffuse, worse over LUQ and LLQ). There is no right CVA tenderness or left CVA tenderness.  Skin:    General: Skin is warm.     Coloration: Skin is pale. Skin is not jaundiced.  Neurological:     Mental Status: She is alert.        Assessment & Plan:   Problem List Items Addressed This Visit       Other   Alcohol dependence with unspecified alcohol-induced disorder ([place]) (Chronic)   Tachycardia   Other Visit Diagnoses     Protuberant abdomen    -  Primary       PLAN: -New pt establishment, concern for acute abdomen - ascites / diverticulitis / pancreatitis vs other - Recommend urgent eval at ED for labs and likely imaging; high risk pt with hx of chronic alcohol use; She is agreeable and will proceed to [facility] ED at this time. Directions provided. Stable to go by personal vehicle.   This note was prepared with assistance of Systems analyst. Occasional wrong-word or sound-a-like substitutions  may have occurred due to the inherent limitations of voice recognition software.    Lurleen Soltero M Ayan Yankey, PA-C

## 2021-07-24 ENCOUNTER — Emergency Department (HOSPITAL_BASED_OUTPATIENT_CLINIC_OR_DEPARTMENT_OTHER): Payer: Self-pay

## 2021-07-24 ENCOUNTER — Other Ambulatory Visit: Payer: Self-pay

## 2021-07-24 ENCOUNTER — Encounter (HOSPITAL_BASED_OUTPATIENT_CLINIC_OR_DEPARTMENT_OTHER): Payer: Self-pay | Admitting: Emergency Medicine

## 2021-07-24 ENCOUNTER — Inpatient Hospital Stay (HOSPITAL_BASED_OUTPATIENT_CLINIC_OR_DEPARTMENT_OTHER)
Admission: EM | Admit: 2021-07-24 | Discharge: 2021-08-06 | DRG: 438 | Disposition: A | Discharge status: Home / Self Care | Payer: Self-pay | Attending: Internal Medicine | Admitting: Internal Medicine

## 2021-07-24 ENCOUNTER — Inpatient Hospital Stay (HOSPITAL_COMMUNITY): Payer: Self-pay

## 2021-07-24 DIAGNOSIS — R451 Restlessness and agitation: Secondary | ICD-10-CM | POA: Diagnosis not present

## 2021-07-24 DIAGNOSIS — K859 Acute pancreatitis without necrosis or infection, unspecified: Secondary | ICD-10-CM | POA: Diagnosis present

## 2021-07-24 DIAGNOSIS — D259 Leiomyoma of uterus, unspecified: Secondary | ICD-10-CM | POA: Diagnosis present

## 2021-07-24 DIAGNOSIS — F418 Other specified anxiety disorders: Secondary | ICD-10-CM | POA: Diagnosis present

## 2021-07-24 DIAGNOSIS — K8689 Other specified diseases of pancreas: Secondary | ICD-10-CM | POA: Diagnosis present

## 2021-07-24 DIAGNOSIS — T4275XA Adverse effect of unspecified antiepileptic and sedative-hypnotic drugs, initial encounter: Secondary | ICD-10-CM | POA: Diagnosis not present

## 2021-07-24 DIAGNOSIS — J449 Chronic obstructive pulmonary disease, unspecified: Secondary | ICD-10-CM | POA: Diagnosis present

## 2021-07-24 DIAGNOSIS — K7460 Unspecified cirrhosis of liver: Secondary | ICD-10-CM | POA: Diagnosis present

## 2021-07-24 DIAGNOSIS — K760 Fatty (change of) liver, not elsewhere classified: Secondary | ICD-10-CM | POA: Diagnosis present

## 2021-07-24 DIAGNOSIS — Z20822 Contact with and (suspected) exposure to covid-19: Secondary | ICD-10-CM | POA: Diagnosis present

## 2021-07-24 DIAGNOSIS — G629 Polyneuropathy, unspecified: Secondary | ICD-10-CM | POA: Diagnosis present

## 2021-07-24 DIAGNOSIS — K567 Ileus, unspecified: Secondary | ICD-10-CM | POA: Diagnosis not present

## 2021-07-24 DIAGNOSIS — R935 Abnormal findings on diagnostic imaging of other abdominal regions, including retroperitoneum: Secondary | ICD-10-CM | POA: Diagnosis present

## 2021-07-24 DIAGNOSIS — F419 Anxiety disorder, unspecified: Secondary | ICD-10-CM | POA: Diagnosis present

## 2021-07-24 DIAGNOSIS — K863 Pseudocyst of pancreas: Secondary | ICD-10-CM | POA: Diagnosis present

## 2021-07-24 DIAGNOSIS — K862 Cyst of pancreas: Secondary | ICD-10-CM

## 2021-07-24 DIAGNOSIS — F32A Depression, unspecified: Secondary | ICD-10-CM | POA: Diagnosis present

## 2021-07-24 DIAGNOSIS — J9601 Acute respiratory failure with hypoxia: Secondary | ICD-10-CM | POA: Diagnosis not present

## 2021-07-24 DIAGNOSIS — K852 Alcohol induced acute pancreatitis without necrosis or infection: Principal | ICD-10-CM | POA: Diagnosis present

## 2021-07-24 DIAGNOSIS — I959 Hypotension, unspecified: Secondary | ICD-10-CM | POA: Diagnosis not present

## 2021-07-24 DIAGNOSIS — R000 Tachycardia, unspecified: Secondary | ICD-10-CM | POA: Diagnosis present

## 2021-07-24 DIAGNOSIS — J9602 Acute respiratory failure with hypercapnia: Secondary | ICD-10-CM | POA: Diagnosis not present

## 2021-07-24 DIAGNOSIS — K219 Gastro-esophageal reflux disease without esophagitis: Secondary | ICD-10-CM | POA: Diagnosis present

## 2021-07-24 DIAGNOSIS — Z781 Physical restraint status: Secondary | ICD-10-CM

## 2021-07-24 DIAGNOSIS — R911 Solitary pulmonary nodule: Secondary | ICD-10-CM | POA: Diagnosis present

## 2021-07-24 DIAGNOSIS — I748 Embolism and thrombosis of other arteries: Secondary | ICD-10-CM | POA: Diagnosis present

## 2021-07-24 DIAGNOSIS — C259 Malignant neoplasm of pancreas, unspecified: Secondary | ICD-10-CM

## 2021-07-24 DIAGNOSIS — F10231 Alcohol dependence with withdrawal delirium: Secondary | ICD-10-CM | POA: Diagnosis not present

## 2021-07-24 DIAGNOSIS — J441 Chronic obstructive pulmonary disease with (acute) exacerbation: Secondary | ICD-10-CM | POA: Diagnosis present

## 2021-07-24 DIAGNOSIS — F10931 Alcohol use, unspecified with withdrawal delirium: Secondary | ICD-10-CM | POA: Diagnosis present

## 2021-07-24 DIAGNOSIS — E876 Hypokalemia: Secondary | ICD-10-CM | POA: Diagnosis not present

## 2021-07-24 DIAGNOSIS — G9341 Metabolic encephalopathy: Secondary | ICD-10-CM | POA: Diagnosis not present

## 2021-07-24 DIAGNOSIS — F17210 Nicotine dependence, cigarettes, uncomplicated: Secondary | ICD-10-CM | POA: Diagnosis present

## 2021-07-24 DIAGNOSIS — B029 Zoster without complications: Secondary | ICD-10-CM | POA: Diagnosis present

## 2021-07-24 DIAGNOSIS — F1020 Alcohol dependence, uncomplicated: Secondary | ICD-10-CM | POA: Diagnosis present

## 2021-07-24 DIAGNOSIS — G4700 Insomnia, unspecified: Secondary | ICD-10-CM | POA: Diagnosis present

## 2021-07-24 DIAGNOSIS — D7589 Other specified diseases of blood and blood-forming organs: Secondary | ICD-10-CM | POA: Diagnosis present

## 2021-07-24 DIAGNOSIS — Z79899 Other long term (current) drug therapy: Secondary | ICD-10-CM

## 2021-07-24 DIAGNOSIS — F1029 Alcohol dependence with unspecified alcohol-induced disorder: Secondary | ICD-10-CM | POA: Diagnosis present

## 2021-07-24 DIAGNOSIS — E8809 Other disorders of plasma-protein metabolism, not elsewhere classified: Secondary | ICD-10-CM | POA: Diagnosis present

## 2021-07-24 DIAGNOSIS — K2100 Gastro-esophageal reflux disease with esophagitis, without bleeding: Secondary | ICD-10-CM | POA: Diagnosis present

## 2021-07-24 DIAGNOSIS — Z825 Family history of asthma and other chronic lower respiratory diseases: Secondary | ICD-10-CM

## 2021-07-24 LAB — CBC WITH DIFFERENTIAL/PLATELET
Abs Immature Granulocytes: 0.06 10*3/uL (ref 0.00–0.07)
Abs Immature Granulocytes: 0.1 10*3/uL — ABNORMAL HIGH (ref 0.00–0.07)
Basophils Absolute: 0.1 10*3/uL (ref 0.0–0.1)
Basophils Absolute: 0.1 10*3/uL (ref 0.0–0.1)
Basophils Relative: 1 %
Basophils Relative: 1 %
Eosinophils Absolute: 0.2 10*3/uL (ref 0.0–0.5)
Eosinophils Absolute: 0.1 10*3/uL (ref 0.0–0.5)
Eosinophils Relative: 1 %
Eosinophils Relative: 1 %
HCT: 44.4 % (ref 36.0–46.0)
HCT: 45 % (ref 36.0–46.0)
Hemoglobin: 14.8 g/dL (ref 12.0–15.0)
Hemoglobin: 15.1 g/dL — ABNORMAL HIGH (ref 12.0–15.0)
Immature Granulocytes: 1 %
Immature Granulocytes: 1 %
Lymphocytes Relative: 22 %
Lymphocytes Relative: 19 %
Lymphs Abs: 2.5 10*3/uL (ref 0.7–4.0)
Lymphs Abs: 2.6 10*3/uL (ref 0.7–4.0)
MCH: 34.3 pg — ABNORMAL HIGH (ref 26.0–34.0)
MCH: 35.5 pg — ABNORMAL HIGH (ref 26.0–34.0)
MCHC: 33.3 g/dL (ref 30.0–36.0)
MCHC: 33.6 g/dL (ref 30.0–36.0)
MCV: 103 fL — ABNORMAL HIGH (ref 80.0–100.0)
MCV: 105.9 fL — ABNORMAL HIGH (ref 80.0–100.0)
Monocytes Absolute: 1.2 10*3/uL — ABNORMAL HIGH (ref 0.1–1.0)
Monocytes Absolute: 1.4 10*3/uL — ABNORMAL HIGH (ref 0.1–1.0)
Monocytes Relative: 11 %
Monocytes Relative: 10 %
Neutro Abs: 7.3 10*3/uL (ref 1.7–7.7)
Neutro Abs: 9.5 10*3/uL — ABNORMAL HIGH (ref 1.7–7.7)
Neutrophils Relative %: 64 %
Neutrophils Relative %: 68 %
Platelets: 621 10*3/uL — ABNORMAL HIGH (ref 150–400)
Platelets: 608 10*3/uL — ABNORMAL HIGH (ref 150–400)
RBC: 4.31 MIL/uL (ref 3.87–5.11)
RBC: 4.25 MIL/uL (ref 3.87–5.11)
RDW: 13.7 % (ref 11.5–15.5)
RDW: 13.9 % (ref 11.5–15.5)
WBC: 11.3 10*3/uL — ABNORMAL HIGH (ref 4.0–10.5)
WBC: 13.9 10*3/uL — ABNORMAL HIGH (ref 4.0–10.5)
nRBC: 0 % (ref 0.0–0.2)
nRBC: 0 % (ref 0.0–0.2)

## 2021-07-24 LAB — COMPREHENSIVE METABOLIC PANEL
ALT: 14 U/L (ref 0–44)
ALT: 16 U/L (ref 0–44)
AST: 28 U/L (ref 15–41)
AST: 31 U/L (ref 15–41)
Albumin: 3.4 g/dL — ABNORMAL LOW (ref 3.5–5.0)
Albumin: 3.1 g/dL — ABNORMAL LOW (ref 3.5–5.0)
Alkaline Phosphatase: 121 U/L (ref 38–126)
Alkaline Phosphatase: 124 U/L (ref 38–126)
Anion gap: 12 (ref 5–15)
Anion gap: 10 (ref 5–15)
BUN: 7 mg/dL — ABNORMAL LOW (ref 8–23)
BUN: 5 mg/dL — ABNORMAL LOW (ref 8–23)
CO2: 29 mmol/L (ref 22–32)
CO2: 28 mmol/L (ref 22–32)
Calcium: 9 mg/dL (ref 8.9–10.3)
Calcium: 8.6 mg/dL — ABNORMAL LOW (ref 8.9–10.3)
Chloride: 96 mmol/L — ABNORMAL LOW (ref 98–111)
Chloride: 100 mmol/L (ref 98–111)
Creatinine, Ser: 0.45 mg/dL (ref 0.44–1.00)
Creatinine, Ser: 0.54 mg/dL (ref 0.44–1.00)
GFR, Estimated: >60 mL/min (ref >60)
GFR, Estimated: >60 mL/min (ref >60)
Glucose, Bld: 115 mg/dL — ABNORMAL HIGH (ref 70–99)
Glucose, Bld: 111 mg/dL — ABNORMAL HIGH (ref 70–99)
Potassium: 3.7 mmol/L (ref 3.5–5.1)
Potassium: 3.8 mmol/L (ref 3.5–5.1)
Sodium: 137 mmol/L (ref 135–145)
Sodium: 138 mmol/L (ref 135–145)
Total Bilirubin: 0.4 mg/dL (ref 0.3–1.2)
Total Bilirubin: 0.7 mg/dL (ref 0.3–1.2)
Total Protein: 6.4 g/dL — ABNORMAL LOW (ref 6.5–8.1)
Total Protein: 6.1 g/dL — ABNORMAL LOW (ref 6.5–8.1)

## 2021-07-24 LAB — URINALYSIS, ROUTINE W REFLEX MICROSCOPIC
Bilirubin Urine: NEGATIVE
Glucose, UA: NEGATIVE mg/dL
Hgb urine dipstick: NEGATIVE
Ketones, ur: NEGATIVE mg/dL
Leukocytes,Ua: NEGATIVE
Nitrite: NEGATIVE
Specific Gravity, Urine: 1.019 (ref 1.005–1.030)
pH: 5 (ref 5.0–8.0)

## 2021-07-24 LAB — BLOOD GAS, VENOUS
Acid-Base Excess: 3.7 mmol/L — ABNORMAL HIGH (ref 0.0–2.0)
Bicarbonate: 32.5 mmol/L — ABNORMAL HIGH (ref 20.0–28.0)
Drawn by: 64366
O2 Saturation: 80.1 %
Patient temperature: 36.7
pCO2, Ven: 65 mmHg — ABNORMAL HIGH (ref 44–60)
pH, Ven: 7.3 (ref 7.25–7.43)
pO2, Ven: 50 mmHg — ABNORMAL HIGH (ref 32–45)

## 2021-07-24 LAB — BRAIN NATRIURETIC PEPTIDE: B Natriuretic Peptide: 42 pg/mL (ref 0.0–100.0)

## 2021-07-24 LAB — LIPASE, BLOOD: Lipase: 109 U/L — ABNORMAL HIGH (ref 11–51)

## 2021-07-24 LAB — SARS CORONAVIRUS 2 BY RT PCR: SARS Coronavirus 2 by RT PCR: NEGATIVE

## 2021-07-24 LAB — PROCALCITONIN: Procalcitonin: <0.1 ng/mL

## 2021-07-24 LAB — TSH: TSH: 6.32 u[IU]/mL — ABNORMAL HIGH (ref 0.350–4.500)

## 2021-07-24 LAB — HIV ANTIBODY (ROUTINE TESTING W REFLEX): HIV Screen 4th Generation wRfx: NONREACTIVE

## 2021-07-24 LAB — MAGNESIUM: Magnesium: 2 mg/dL (ref 1.7–2.4)

## 2021-07-24 LAB — LACTIC ACID, PLASMA
Lactic Acid, Venous: 1.2 mmol/L (ref 0.5–1.9)
Lactic Acid, Venous: 1.2 mmol/L (ref 0.5–1.9)

## 2021-07-24 LAB — TROPONIN I (HIGH SENSITIVITY): Troponin I (High Sensitivity): 6 ng/L (ref <18)

## 2021-07-24 LAB — OCCULT BLOOD X 1 CARD TO LAB, STOOL: Fecal Occult Bld: POSITIVE — AB

## 2021-07-24 LAB — PHOSPHORUS: Phosphorus: 3.5 mg/dL (ref 2.5–4.6)

## 2021-07-24 MED ORDER — ACETAMINOPHEN 650 MG RE SUPP: 650.0000 mg | Freq: Four times a day (QID) | RECTAL | Status: DC | PRN

## 2021-07-24 MED ORDER — ALBUTEROL SULFATE (2.5 MG/3ML) 0.083% IN NEBU: 2.5000 mg | INHALATION_SOLUTION | RESPIRATORY_TRACT | Status: DC | PRN

## 2021-07-24 MED ORDER — SODIUM CHLORIDE 0.9 % IV BOLUS
1000.0000 mL | Freq: Once | INTRAVENOUS | Status: AC
  Administered 2021-07-24: 1000 mL via INTRAVENOUS

## 2021-07-24 MED ORDER — HYDROMORPHONE HCL 1 MG/ML IJ SOLN
0.5000 mg | INTRAMUSCULAR | Status: DC | PRN
  Administered 2021-07-25 – 2021-07-26 (×6): 0.5 mg via INTRAVENOUS
  Filled 2021-07-24 (×6): qty 0.5

## 2021-07-24 MED ORDER — LORAZEPAM 2 MG/ML IJ SOLN
0.0000 mg | Freq: Four times a day (QID) | INTRAMUSCULAR | Status: AC
  Administered 2021-07-24 (×2): 1 mg via INTRAVENOUS
  Administered 2021-07-25: 2 mg via INTRAVENOUS
  Administered 2021-07-25 (×3): 1 mg via INTRAVENOUS
  Administered 2021-07-26 (×2): 2 mg via INTRAVENOUS
  Filled 2021-07-24 (×8): qty 1

## 2021-07-24 MED ORDER — SODIUM CHLORIDE 0.9 % IV SOLN: Freq: Once | INTRAVENOUS | Status: AC

## 2021-07-24 MED ORDER — ADULT MULTIVITAMIN W/MINERALS CH
1.0000 | ORAL_TABLET | Freq: Every day | ORAL | Status: DC
  Administered 2021-07-24 – 2021-07-26 (×3): 1 via ORAL
  Filled 2021-07-24 (×3): qty 1

## 2021-07-24 MED ORDER — IOHEXOL 300 MG/ML  SOLN
100.0000 mL | Freq: Once | INTRAMUSCULAR | Status: AC | PRN
  Administered 2021-07-24: 80 mL via INTRAVENOUS

## 2021-07-24 MED ORDER — ONDANSETRON HCL 4 MG/2ML IJ SOLN
4.0000 mg | Freq: Four times a day (QID) | INTRAMUSCULAR | Status: DC | PRN
  Administered 2021-07-26: 4 mg via INTRAVENOUS
  Filled 2021-07-24: qty 2

## 2021-07-24 MED ORDER — PANTOPRAZOLE SODIUM 40 MG IV SOLR
40.0000 mg | Freq: Once | INTRAVENOUS | Status: AC
  Administered 2021-07-24: 40 mg via INTRAVENOUS
  Filled 2021-07-24: qty 10

## 2021-07-24 MED ORDER — LACTATED RINGERS IV SOLN: INTRAVENOUS | Status: AC

## 2021-07-24 MED ORDER — PANTOPRAZOLE SODIUM 40 MG IV SOLR
40.0000 mg | Freq: Two times a day (BID) | INTRAVENOUS | Status: DC
  Administered 2021-07-24 – 2021-07-29 (×11): 40 mg via INTRAVENOUS
  Filled 2021-07-24 (×10): qty 10

## 2021-07-24 MED ORDER — LORAZEPAM 1 MG PO TABS: 0.0000 mg | ORAL_TABLET | Freq: Two times a day (BID) | ORAL | Status: DC

## 2021-07-24 MED ORDER — LORAZEPAM 1 MG PO TABS: 0.0000 mg | ORAL_TABLET | Freq: Four times a day (QID) | ORAL | Status: DC

## 2021-07-24 MED ORDER — THIAMINE HCL 100 MG/ML IJ SOLN: 100.0000 mg | Freq: Every day | INTRAMUSCULAR | Status: DC

## 2021-07-24 MED ORDER — IPRATROPIUM-ALBUTEROL 0.5-2.5 (3) MG/3ML IN SOLN
3.0000 mL | Freq: Four times a day (QID) | RESPIRATORY_TRACT | Status: DC
Start: 2021-07-24 — End: 2021-07-24
  Administered 2021-07-24: 3 mL via RESPIRATORY_TRACT
  Filled 2021-07-24: qty 3

## 2021-07-24 MED ORDER — SODIUM CHLORIDE 0.9 % IV SOLN
500.0000 mg | INTRAVENOUS | Status: DC
  Administered 2021-07-25 – 2021-07-26 (×2): 500 mg via INTRAVENOUS
  Filled 2021-07-24 (×3): qty 5

## 2021-07-24 MED ORDER — LORAZEPAM 2 MG/ML IJ SOLN
1.0000 mg | Freq: Once | INTRAMUSCULAR | Status: AC
  Administered 2021-07-24: 1 mg via INTRAVENOUS
  Filled 2021-07-24: qty 1

## 2021-07-24 MED ORDER — ONDANSETRON HCL 4 MG PO TABS: 4.0000 mg | ORAL_TABLET | Freq: Four times a day (QID) | ORAL | Status: DC | PRN

## 2021-07-24 MED ORDER — LORAZEPAM 2 MG/ML IJ SOLN: 0.0000 mg | Freq: Four times a day (QID) | INTRAMUSCULAR | Status: DC

## 2021-07-24 MED ORDER — LORAZEPAM 1 MG PO TABS: 0.0000 mg | ORAL_TABLET | Freq: Four times a day (QID) | ORAL | Status: AC

## 2021-07-24 MED ORDER — FOLIC ACID 5 MG/ML IJ SOLN
1.0000 mg | Freq: Every day | INTRAMUSCULAR | Status: DC
Start: 2021-07-24 — End: 2021-07-29
  Administered 2021-07-24 – 2021-07-28 (×5): 1 mg via INTRAVENOUS
  Filled 2021-07-24 (×7): qty 0.2

## 2021-07-24 MED ORDER — METHYLPREDNISOLONE SODIUM SUCC 40 MG IJ SOLR
40.0000 mg | Freq: Two times a day (BID) | INTRAMUSCULAR | Status: DC
  Administered 2021-07-25 (×3): 40 mg via INTRAVENOUS
  Filled 2021-07-24 (×3): qty 1

## 2021-07-24 MED ORDER — NICOTINE 21 MG/24HR TD PT24
21.0000 mg | MEDICATED_PATCH | Freq: Every day | TRANSDERMAL | Status: DC
Start: 2021-07-24 — End: 2021-07-28
  Administered 2021-07-24 – 2021-07-28 (×5): 21 mg via TRANSDERMAL
  Filled 2021-07-24 (×5): qty 1

## 2021-07-24 MED ORDER — IOHEXOL 350 MG/ML SOLN
75.0000 mL | Freq: Once | INTRAVENOUS | Status: AC | PRN
  Administered 2021-07-24: 75 mL via INTRAVENOUS

## 2021-07-24 MED ORDER — ACETAMINOPHEN 325 MG PO TABS
650.0000 mg | ORAL_TABLET | Freq: Four times a day (QID) | ORAL | Status: DC | PRN
  Administered 2021-07-26: 650 mg via ORAL
  Filled 2021-07-24: qty 2

## 2021-07-24 MED ORDER — IPRATROPIUM-ALBUTEROL 0.5-2.5 (3) MG/3ML IN SOLN
3.0000 mL | Freq: Two times a day (BID) | RESPIRATORY_TRACT | Status: DC
Start: 2021-07-25 — End: 2021-07-25
  Administered 2021-07-25: 3 mL via RESPIRATORY_TRACT
  Filled 2021-07-24: qty 3

## 2021-07-24 MED ORDER — LORAZEPAM 2 MG/ML IJ SOLN
0.0000 mg | Freq: Two times a day (BID) | INTRAMUSCULAR | Status: DC
  Administered 2021-07-26: 2 mg via INTRAVENOUS
  Filled 2021-07-24: qty 1

## 2021-07-24 MED ORDER — SODIUM CHLORIDE 0.9 % IV SOLN
1.0000 g | INTRAVENOUS | Status: DC
  Administered 2021-07-25 – 2021-07-26 (×3): 1 g via INTRAVENOUS
  Filled 2021-07-24 (×3): qty 10

## 2021-07-24 MED ORDER — THIAMINE HCL 100 MG/ML IJ SOLN
100.0000 mg | Freq: Every day | INTRAMUSCULAR | Status: DC
  Administered 2021-07-24 – 2021-07-29 (×6): 100 mg via INTRAVENOUS
  Filled 2021-07-24 (×6): qty 2

## 2021-07-24 NOTE — ED Triage Notes (Signed)
Pt having constipation/abd pain  /black diarrhea for 3 days.nausea.

## 2021-07-24 NOTE — Progress Notes (Signed)
Pt out for CT scan

## 2021-07-24 NOTE — H&P (Addendum)
History and Physical    Kathryn Underwood QJJ:[id number] DOB: Dec 01, 1960 DOA: 07/24/2021  PCP: Fredirick Lathe, PA-C  Patient coming from:  [place]  I have personally briefly reviewed patient's old medical records in [place]  Chief Complaint: abdominal pain   HPI: Kathryn Underwood is a 61 y.o. female with medical history significant of   anxiety, depression, GERD ,neuropathy , etoh pancreatitis, and heavy daily alcohol use who presents from [facility] with  complaint of abdominal pain x 2 weeks with poor appetite due to pain. Patient notes 4 days ago after eating she had increase in severe pain as well as diarrhea with black  stools. Due to persistence of symptoms she presented to ED for evaluation. She denies any vomiting or nausea but notes significant sensation of bloating. She notes pain is a stabbing pressure like pain on sides of her abdomen and epigastric area. Patient notes that she was not able to eat w/o symptoms but was able to drink.  She notes no fever or chills, but does endorse sob and tightness in her chest as well as coughing and wheezing. She notes that coughing started one month ago and has progressed.  She notes she continues tobacco use but denise diagnosis of  COPD.  In reference to etoh intake she drinks 3-4 glasses of wine daily and last drink was this am. She does endorse history of withdrawal symptoms. She notes her last ETOH rehab stay was 3 years ago. ED Course:  On evaluation  Vitals:afeb, bp 115/82, hr 118, sat 90% -88 on ra - 945 3L rr 22, Wbc: 11.3, hgb14.8, mcv 103, plt 621 Fecal occult: +  UA: +wbc, few bacteria  Na 137, K3.7,  cr 0.45, ast 28, alt14, lipase 109 CE6 Cxr :NAD Covid :neg CTAB IMPRESSION: 1. Focal abnormality in the pancreatic body/neck region with dilatation of the pancreatic duct distal to this area. Findings raise concern for an underlying neoplastic lesion although focal inflammation from pancreatitis is also in the  differential diagnosis. There are changes compatible with pancreatitis including stranding anterior to the pancreas with a fluid collection around the pancreatic body and a massive fluid collection involving the stomach which is causing severe compression of the stomach lumen. These findings could be secondary to pancreatitis with a massive pseudocyst formation but need to exclude a neoplastic process in the pancreatic neck region. Recommend GI consultation. 2. Small amount of fluid and stranding in the abdomen and pelvis which could be associated with pancreatitis. However, some of these areas of peritoneal low density are indeterminate and peritoneal neoplastic disease can not be excluded depending on the etiology of the pancreatic lesion. 3. Hepatic steatosis with scattered hypodensities in the liver. Some of these hypodensities could represent cysts or even areas of focal fat. Subtle hepatic lesions cannot be excluded and could be better characterized with a liver MRI. 4. Uterine fibroids. Tx protonix, ativan iv 1 mg , ns 1L,thiamine  Review of Systems: As per HPI otherwise 10 point review of systems negative.   Past Medical History:  Diagnosis Date   Alcohol abuse    Anxiety    Chickenpox    Depression    Neuromuscular disorder ([place])    neuropathy   Substance abuse ([place])     Past Surgical History:  Procedure Laterality Date   CESAREAN SECTION     TONSILLECTOMY       reports that she has been smoking cigarettes. She has been smoking an average of .  5 packs per day. She has never used smokeless tobacco. She reports current alcohol use of about 16.0 standard drinks of alcohol per week. She reports current drug use. Drugs: Cocaine and Marijuana.  No Known Allergies  Family History  Problem Relation Age of Onset   Dementia Mother    Arthritis Father    Asthma Father    Asthma Brother    Diabetes Maternal Grandmother    Dementia Maternal Grandmother    Diabetes  Paternal Grandmother    Parkinson's disease Maternal Grandfather    Parkinson's disease Paternal Grandfather    Breast cancer Neg Hx    Colon cancer Neg Hx     Prior to Admission medications   Medication Sig Start Date End Date Taking? Authorizing Provider  gabapentin (NEURONTIN) 300 MG capsule Take 2 capsules (600 mg total) by mouth 2 (two) times daily. 10/09/17  Yes Malvin Johns, MD  mirtazapine (REMERON) 45 MG tablet Take 45 mg by mouth at bedtime. 07/17/21  Yes [provider]  QUEtiapine (SEROQUEL) 50 MG tablet Take 1 tablet by mouth at bedtime. 07/17/21  Yes [provider]  chlordiazePOXIDE (LIBRIUM) 25 MG capsule '50mg'$  PO TID x 1D, then 25-'50mg'$  PO BID X 1D, then 25-'50mg'$  PO QD X 1D Patient not taking: Reported on 07/23/2021 03/24/18   Isla Pence, MD  cyclobenzaprine (FLEXERIL) 10 MG tablet Take 1 tablet (10 mg total) by mouth 3 (three) times daily as needed for muscle spasms. Patient not taking: Reported on 07/23/2021 10/09/17   Malvin Johns, MD  folic acid (FOLVITE) 1 MG tablet Take 1 tablet (1 mg total) by mouth daily. Patient not taking: Reported on 07/23/2021 04/29/16   Raiford Noble Latif, DO  Multiple Vitamin (MULTIVITAMIN WITH MINERALS) TABS tablet Take 1 tablet by mouth daily. Patient not taking: Reported on 07/24/2021 10/09/17   Georgette Shell, MD  nicotine (NICODERM CQ - DOSED IN MG/24 HOURS) 21 mg/24hr patch Place 1 patch (21 mg total) onto the skin daily. Patient not taking: Reported on 07/23/2021 10/09/17   Georgette Shell, MD  pantoprazole (PROTONIX) 20 MG tablet Take 1 tablet (20 mg total) by mouth daily. Patient not taking: Reported on 07/23/2021 09/20/18   Caccavale, Sophia, PA-C  potassium chloride SA (K-DUR,KLOR-CON) 20 MEQ tablet Take 1 tablet (20 mEq total) by mouth daily. Patient not taking: Reported on 07/23/2021 10/09/17   Malvin Johns, MD  sucralfate (CARAFATE) 1 GM/10ML suspension Take 10 mLs (1 g total) by mouth 4 (four) times daily -   with meals and at bedtime. Patient not taking: Reported on 07/23/2021 09/20/18   Caccavale, Sophia, PA-C  thiamine 100 MG tablet Take 1 tablet (100 mg total) by mouth daily. Patient not taking: Reported on 07/23/2021 10/09/17   Georgette Shell, MD    Physical Exam: Vitals:   07/24/21 1130 07/24/21 1245 07/24/21 1354 07/24/21 1436  BP: 121/77 127/70  97/66  Pulse: (!) 116 (!) 111    Resp: 16 18    Temp:   97.9 F (36.6 C) 98.5 F (36.9 C)  TempSrc:   Oral Oral  SpO2: 94% 94%  94%    Vitals:   07/24/21 1130 07/24/21 1245 07/24/21 1354 07/24/21 1436  BP: 121/77 127/70  97/66  Pulse: (!) 116 (!) 111    Resp: 16 18    Temp:   97.9 F (36.6 C) 98.5 F (36.9 C)  TempSrc:   Oral Oral  SpO2: 94% 94%  94%   Constitutional: NAD, calm, comfortable Eyes:  PERRL, lids and conjunctivae normal ENMT: Mucous membranes are moist. Posterior pharynx clear of any exudate or lesions.Normal dentition.  Neck: normal, supple, no masses, no thyromegaly Respiratory: +wheezing, no crackles. Normal respiratory effort. No accessory muscle use.  Cardiovascular: Regular rate and rhythm, no murmurs / rubs / gallops. No extremity edema. 2+ pedal pulses.  Abdomen: mild epigastric tenderness, no masses palpated. No hepatosplenomegaly. Bowel sounds positive.  Musculoskeletal: no clubbing / cyanosis. No joint deformity upper and lower extremities. Good ROM, no contractures. Normal muscle tone.  Skin: no rashes, lesions, ulcers. No induration Neurologic: CN 2-12 grossly intact. Sensation intact,Strength 5/5 in all 4.  Psychiatric: Normal judgment and insight. Alert and oriented x 3. Normal mood.    Labs on Admission: I have personally reviewed following labs and imaging studies  CBC: Recent Labs  Lab 07/24/21 0827  WBC 11.3*  NEUTROABS 7.3  HGB 14.8  HCT 44.4  MCV 103.0*  PLT 185*   Basic Metabolic Panel: Recent Labs  Lab 07/24/21 0827  NA 137  K 3.7  CL 96*  CO2 29  GLUCOSE 115*  BUN 7*   CREATININE 0.45  CALCIUM 9.0   GFR: Estimated Creatinine Clearance: 65.6 mL/min (by C-G formula based on SCr of 0.45 mg/dL). Liver Function Tests: Recent Labs  Lab 07/24/21 0827  AST 28  ALT 14  ALKPHOS 121  BILITOT 0.4  PROT 6.4*  ALBUMIN 3.4*   Recent Labs  Lab 07/24/21 0827  LIPASE 109*   No results for input(s): "AMMONIA" in the last 168 hours. Coagulation Profile: No results for input(s): "INR", "PROTIME" in the last 168 hours. Cardiac Enzymes: No results for input(s): "CKTOTAL", "CKMB", "CKMBINDEX", "TROPONINI" in the last 168 hours. BNP (last 3 results) No results for input(s): "PROBNP" in the last 8760 hours. HbA1C: No results for input(s): "HGBA1C" in the last 72 hours. CBG: No results for input(s): "GLUCAP" in the last 168 hours. Lipid Profile: No results for input(s): "CHOL", "HDL", "LDLCALC", "TRIG", "CHOLHDL", "LDLDIRECT" in the last 72 hours. Thyroid Function Tests: No results for input(s): "TSH", "T4TOTAL", "FREET4", "T3FREE", "THYROIDAB" in the last 72 hours. Anemia Panel: No results for input(s): "VITAMINB12", "FOLATE", "FERRITIN", "TIBC", "IRON", "RETICCTPCT" in the last 72 hours. Urine analysis:    Component Value Date/Time   COLORURINE YELLOW 07/24/2021 0827   APPEARANCEUR HAZY (A) 07/24/2021 0827   LABSPEC 1.019 07/24/2021 0827   PHURINE 5.0 07/24/2021 0827   GLUCOSEU NEGATIVE 07/24/2021 0827   HGBUR NEGATIVE 07/24/2021 0827   BILIRUBINUR NEGATIVE 07/24/2021 0827   KETONESUR NEGATIVE 07/24/2021 0827   PROTEINUR TRACE (A) 07/24/2021 0827   UROBILINOGEN 0.2 01/23/2010 1750   NITRITE NEGATIVE 07/24/2021 0827   LEUKOCYTESUR NEGATIVE 07/24/2021 0827    Radiological Exams on Admission: CT ABDOMEN PELVIS W CONTRAST  Result Date: 07/24/2021 CLINICAL DATA:  Abdominal pain, acute.  Black diarrhea. EXAM: CT ABDOMEN AND PELVIS WITH CONTRAST TECHNIQUE: Multidetector CT imaging of the abdomen and pelvis was performed using the standard protocol  following bolus administration of intravenous contrast. RADIATION DOSE REDUCTION: This exam was performed according to the departmental dose-optimization program which includes automated exposure control, adjustment of the mA and/or kV according to patient size and/or use of iterative reconstruction technique. CONTRAST:  15m OMNIPAQUE IOHEXOL 300 MG/ML  SOLN COMPARISON:  None Available. FINDINGS: Lower chest: Lung bases are clear. Hepatobiliary: Decreased attenuation of the liver is compatible with steatosis. Normal appearance of the gallbladder. There is a poorly defined low-density structure along the inferior right hepatic lobe adjacent to  the gallbladder. This area measures approximately 2.2 cm. Additional small hypodensities throughout the liver. Some of these hypodensities could represent cysts but too small to definitively characterize. Trace perihepatic ascites. Main portal venous system is patent. No biliary dilatation. Normal appearance of the gallbladder. Pancreas: Poorly defined hypodensity in the pancreatic neck region, best seen on sequence 2 image 30. The pancreatic duct is dilated distal to this area of decreased attenuation in the pancreas. Normal appearance of the pancreatic head and uncinate process. Stranding anterior to the pancreatic body and there is fluid collection with peripheral enhancement around the distal pancreatic body region that measures 2.7 cm in with sequence 2 image 31. Spleen: Normal in size without focal abnormality. Adrenals/Urinary Tract: Normal appearance of the adrenal glands. Normal appearance of the urinary bladder. Normal appearance of both kidneys without stones, hydronephrosis or suspicious renal lesions. Stomach/Bowel: Massive low-density cystic lesion along the posterior aspect of the stomach which is markedly compressing the stomach lumen. This collection measures 23.9 x 9.1 x 10.5 cm. Normal appearance of the duodenum. There is no significant dilatation of the  distal esophagus. No small bowel or colonic dilatation. Vascular/Lymphatic: Atherosclerosis in the abdominal aorta without aneurysm or dissection. No significant aortic stenosis. No significant lymph node enlargement in the abdomen or pelvis. Reproductive: Calcified uterine fibroids. No evidence for an adnexal lesion. Other: Small amount of free fluid in the right hemipelvis near the right adnexa. Mild mesenteric edema and indeterminate low-density material along the left side of the abdomen on sequence 2 image 35 and just anterior to stomach and image number 35. These areas of low-density could represent trace fluid but difficult to exclude subtle peritoneal neoplastic disease. Musculoskeletal: No suspicious osseous findings. IMPRESSION: 1. Focal abnormality in the pancreatic body/neck region with dilatation of the pancreatic duct distal to this area. Findings raise concern for an underlying neoplastic lesion although focal inflammation from pancreatitis is also in the differential diagnosis. There are changes compatible with pancreatitis including stranding anterior to the pancreas with a fluid collection around the pancreatic body and a massive fluid collection involving the stomach which is causing severe compression of the stomach lumen. These findings could be secondary to pancreatitis with a massive pseudocyst formation but need to exclude a neoplastic process in the pancreatic neck region. Recommend GI consultation. 2. Small amount of fluid and stranding in the abdomen and pelvis which could be associated with pancreatitis. However, some of these areas of peritoneal low density are indeterminate and peritoneal neoplastic disease can not be excluded depending on the etiology of the pancreatic lesion. 3. Hepatic steatosis with scattered hypodensities in the liver. Some of these hypodensities could represent cysts or even areas of focal fat. Subtle hepatic lesions cannot be excluded and could be better  characterized with a liver MRI. 4. Uterine fibroids. These results were called by telephone at the time of interpretation on 07/24/2021 at 10:09 am to provider ADAM CURATOLO , who verbally acknowledged these results. Electronically Signed   By: Markus Daft M.D.   On: 07/24/2021 10:13   DG Chest Portable 1 View  Result Date: 07/24/2021 CLINICAL DATA:  Cough EXAM: PORTABLE CHEST 1 VIEW COMPARISON:  Chest radiograph dated October 04, 2017 FINDINGS: The heart size and mediastinal contours are within normal limits. Both lungs are clear without evidence of focal consolidation or pleural effusion. The visualized skeletal structures are unremarkable. IMPRESSION: No active disease. Electronically Signed   By: Keane Police D.O.   On: 07/24/2021 08:49    EKG:  Independently reviewed.   Assessment/Plan  Acute  pancreatitis  -? 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  -cough increase production over last month  - sob  -hypoxemia on admit -cxr nad  - f/u with ct thorax  -tx as copd exacerbation/ place on copd protocol -pulmonary toilet -wean O2 as able  - Anxiety /depression -resume home regiment once patient tolerating po    GERD  -ppi   DVT prophylaxis: scd Code Status: full Family Communication: n/a Disposition Plan: patient  expected to be admitted greater than 2 midnights  Consults called: Gi [facility] [place] Admission status: med tele   Clance Boll MD [facility]   If 7PM-7AM, please contact night-coverage [email or web address] Password  [facility]  07/24/2021, 3:27 PM

## 2021-07-24 NOTE — Consult Note (Addendum)
Consultation Note   Referring Provider: [facility] PCP: Allwardt, Randa Evens, PA-C Primary Gastroenterologist: None - has a [place] PCP Reason for consultation: pancreatitis  Hospital Day: 1  Assessment   62 yo female with history of Etoh abuse admitted with upper abdominal pain , nausea, dark stools. CT scan suggesting pancreatitis with associated massive cystic lesion along posterior aspect of stomach causing gastric compression, a focal abnormality of pancreatic body / neck region with dilation of PD distal to area raising concern for neoplasm. Hepatic steatosis with scattered hypodensities in liver. Her pain and nausea are most likely related to the pancreatitis and gastric compression by what may be a developing pseudocyst. The focal abnormality of pancreas causing PD dilation will need further workup at some point. Regarding the intermittent dark heme + stools, they are concerning but have a low suspicion for a significant GI bleed right now. She could certainly have esophagitis / gastritis or PUD as a secondary diagnosis.   See PMH for additional medical problems   Plan   NPO for now.  Continue IVF at 150 ml / hr. Hct has improved from 49% to 44%.  Analgesics, anti-emetics as needed.  Agree with BID PPI Monitor hgb. She was most likely hemoconcentrated on admission. Hgb has improved from 16.5 to 14.8.  If large fluid collection doesn't resolved then will probably need to be drainage to relieve symptoms  Advised to discontinue Etoh going forward  HPI   Kathryn Underwood is a 61 y.o. female with a past medical history significant for anxiety, depression, Etoh abuse, pancreatitis . See PMH for any additional medical problems.  Patient gives a 2-3 week history of generalized upper abdominal pain radiating through to her back. Pain is NOT worse with eating but eating makes her feel like she is full of air. She hasn't had any  vomiting but has been nauseated with 'gagging'. She has either been constipated or having loose black stools over the last couple of weeks. She doesn't think there has been any significant weight loss. She has been taking motrin QOD but only since the abdominal pain started. She drinks several drinks .She gives a history of pancreatitis in 2017.    Patient seen by PCP yesterday with abdominal distention and abdominal pain, sent to ED. She was seen at [facility] ED today.   She was hypoxic, started on oxygen  Data Reviewed:   WBC 11.3, hemoglobin 14.8, platelets 621, MCV 103. Lipase 109 albumin 3.4, liver chemistries unremarkable otherwise. BUN 7, creatinine 0.53 FOBT positive CTAP showed steatosis with subtle liver lesions, pancreatitis with massive fluid collection pressing on the stomach. Also focal abnormality in pancreatic body / neck region with dilation of pancreatic duct distal to the area raising concern for neoplastic lesion.  CXR without active disease  Previous GI Evaluation      Recent Labs and Imaging CT ABDOMEN PELVIS W CONTRAST  Result Date: 07/24/2021 CLINICAL DATA:  Abdominal pain, acute.  Black diarrhea. EXAM: CT ABDOMEN AND PELVIS WITH CONTRAST TECHNIQUE: Multidetector CT imaging of the abdomen and pelvis was performed using the standard protocol following bolus administration of intravenous contrast. RADIATION DOSE REDUCTION: This exam was performed according to the departmental dose-optimization program which includes automated exposure control,  adjustment of the mA and/or kV according to patient size and/or use of iterative reconstruction technique. CONTRAST:  83m OMNIPAQUE IOHEXOL 300 MG/ML  SOLN COMPARISON:  None Available. FINDINGS: Lower chest: Lung bases are clear. Hepatobiliary: Decreased attenuation of the liver is compatible with steatosis. Normal appearance of the gallbladder. There is a poorly defined low-density structure along the inferior right hepatic lobe  adjacent to the gallbladder. This area measures approximately 2.2 cm. Additional small hypodensities throughout the liver. Some of these hypodensities could represent cysts but too small to definitively characterize. Trace perihepatic ascites. Main portal venous system is patent. No biliary dilatation. Normal appearance of the gallbladder. Pancreas: Poorly defined hypodensity in the pancreatic neck region, best seen on sequence 2 image 30. The pancreatic duct is dilated distal to this area of decreased attenuation in the pancreas. Normal appearance of the pancreatic head and uncinate process. Stranding anterior to the pancreatic body and there is fluid collection with peripheral enhancement around the distal pancreatic body region that measures 2.7 cm in with sequence 2 image 31. Spleen: Normal in size without focal abnormality. Adrenals/Urinary Tract: Normal appearance of the adrenal glands. Normal appearance of the urinary bladder. Normal appearance of both kidneys without stones, hydronephrosis or suspicious renal lesions. Stomach/Bowel: Massive low-density cystic lesion along the posterior aspect of the stomach which is markedly compressing the stomach lumen. This collection measures 23.9 x 9.1 x 10.5 cm. Normal appearance of the duodenum. There is no significant dilatation of the distal esophagus. No small bowel or colonic dilatation. Vascular/Lymphatic: Atherosclerosis in the abdominal aorta without aneurysm or dissection. No significant aortic stenosis. No significant lymph node enlargement in the abdomen or pelvis. Reproductive: Calcified uterine fibroids. No evidence for an adnexal lesion. Other: Small amount of free fluid in the right hemipelvis near the right adnexa. Mild mesenteric edema and indeterminate low-density material along the left side of the abdomen on sequence 2 image 35 and just anterior to stomach and image number 35. These areas of low-density could represent trace fluid but difficult to  exclude subtle peritoneal neoplastic disease. Musculoskeletal: No suspicious osseous findings. IMPRESSION: 1. Focal abnormality in the pancreatic body/neck region with dilatation of the pancreatic duct distal to this area. Findings raise concern for an underlying neoplastic lesion although focal inflammation from pancreatitis is also in the differential diagnosis. There are changes compatible with pancreatitis including stranding anterior to the pancreas with a fluid collection around the pancreatic body and a massive fluid collection involving the stomach which is causing severe compression of the stomach lumen. These findings could be secondary to pancreatitis with a massive pseudocyst formation but need to exclude a neoplastic process in the pancreatic neck region. Recommend GI consultation. 2. Small amount of fluid and stranding in the abdomen and pelvis which could be associated with pancreatitis. However, some of these areas of peritoneal low density are indeterminate and peritoneal neoplastic disease can not be excluded depending on the etiology of the pancreatic lesion. 3. Hepatic steatosis with scattered hypodensities in the liver. Some of these hypodensities could represent cysts or even areas of focal fat. Subtle hepatic lesions cannot be excluded and could be better characterized with a liver MRI. 4. Uterine fibroids. These results were called by telephone at the time of interpretation on 07/24/2021 at 10:09 am to provider ADAM CURATOLO , who verbally acknowledged these results. Electronically Signed   By: AMarkus DaftM.D.   On: 07/24/2021 10:13   DG Chest Portable 1 View  Result Date: 07/24/2021 CLINICAL DATA:  Cough EXAM: PORTABLE CHEST 1 VIEW COMPARISON:  Chest radiograph dated October 04, 2017 FINDINGS: The heart size and mediastinal contours are within normal limits. Both lungs are clear without evidence of focal consolidation or pleural effusion. The visualized skeletal structures are  unremarkable. IMPRESSION: No active disease. Electronically Signed   By: Keane Police D.O.   On: 07/24/2021 08:49    Labs:  Recent Labs    07/24/21 0827  WBC 11.3*  HGB 14.8  HCT 44.4  PLT 621*   Recent Labs    07/24/21 0827  NA 137  K 3.7  CL 96*  CO2 29  GLUCOSE 115*  BUN 7*  CREATININE 0.45  CALCIUM 9.0   Recent Labs    07/24/21 0827  PROT 6.4*  ALBUMIN 3.4*  AST 28  ALT 14  ALKPHOS 121  BILITOT 0.4   No results for input(s): "HEPBSAG", "HCVAB", "HEPAIGM", "HEPBIGM" in the last 72 hours. No results for input(s): "LABPROT", "INR" in the last 72 hours.  Past Medical History:  Diagnosis Date   Alcohol abuse    Anxiety    Chickenpox    Depression    Neuromuscular disorder ([place])    neuropathy   Substance abuse ([place])     Past Surgical History:  Procedure Laterality Date   CESAREAN SECTION     TONSILLECTOMY      Family History  Problem Relation Age of Onset   Dementia Mother    Arthritis Father    Asthma Father    Asthma Brother    Diabetes Maternal Grandmother    Dementia Maternal Grandmother    Diabetes Paternal Grandmother    Parkinson's disease Maternal Grandfather    Parkinson's disease Paternal Grandfather    Breast cancer Neg Hx    Colon cancer Neg Hx     Prior to Admission medications   Medication Sig Start Date End Date Taking? Authorizing Provider  gabapentin (NEURONTIN) 300 MG capsule Take 2 capsules (600 mg total) by mouth 2 (two) times daily. 10/09/17  Yes Malvin Johns, MD  mirtazapine (REMERON) 45 MG tablet Take 45 mg by mouth at bedtime. 07/17/21  Yes [provider]  QUEtiapine (SEROQUEL) 50 MG tablet Take 1 tablet by mouth at bedtime. 07/17/21  Yes [provider]  chlordiazePOXIDE (LIBRIUM) 25 MG capsule '50mg'$  PO TID x 1D, then 25-'50mg'$  PO BID X 1D, then 25-'50mg'$  PO QD X 1D Patient not taking: Reported on 07/23/2021 03/24/18   Isla Pence, MD  cyclobenzaprine (FLEXERIL) 10 MG tablet Take 1 tablet (10 mg total) by  mouth 3 (three) times daily as needed for muscle spasms. Patient not taking: Reported on 07/23/2021 10/09/17   Malvin Johns, MD  folic acid (FOLVITE) 1 MG tablet Take 1 tablet (1 mg total) by mouth daily. Patient not taking: Reported on 07/23/2021 04/29/16   Raiford Noble Latif, DO  Multiple Vitamin (MULTIVITAMIN WITH MINERALS) TABS tablet Take 1 tablet by mouth daily. Patient not taking: Reported on 07/24/2021 10/09/17   Georgette Shell, MD  nicotine (NICODERM CQ - DOSED IN MG/24 HOURS) 21 mg/24hr patch Place 1 patch (21 mg total) onto the skin daily. Patient not taking: Reported on 07/23/2021 10/09/17   Georgette Shell, MD  pantoprazole (PROTONIX) 20 MG tablet Take 1 tablet (20 mg total) by mouth daily. Patient not taking: Reported on 07/23/2021 09/20/18   Caccavale, Sophia, PA-C  potassium chloride SA (K-DUR,KLOR-CON) 20 MEQ tablet Take 1 tablet (20 mEq total) by mouth daily. Patient not taking: Reported on  07/23/2021 10/09/17   Malvin Johns, MD  sucralfate (CARAFATE) 1 GM/10ML suspension Take 10 mLs (1 g total) by mouth 4 (four) times daily -  with meals and at bedtime. Patient not taking: Reported on 07/23/2021 09/20/18   Caccavale, Sophia, PA-C  thiamine 100 MG tablet Take 1 tablet (100 mg total) by mouth daily. Patient not taking: Reported on 07/23/2021 10/09/17   Georgette Shell, MD    Current Facility-Administered Medications  Medication Dose Route Frequency Provider Last Rate Last Admin   [START ON 07/26/2021] LORazepam (ATIVAN) injection 0-4 mg  0-4 mg Intravenous Q12H Curatolo, Adam, DO       Or   [START ON 07/26/2021] LORazepam (ATIVAN) tablet 0-4 mg  0-4 mg Oral Q12H Curatolo, Adam, DO       LORazepam (ATIVAN) injection 0-4 mg  0-4 mg Intravenous Q6H Pahwani, Einar Grad, MD       Or   LORazepam (ATIVAN) tablet 0-4 mg  0-4 mg Oral Q6H Pahwani, Ravi, MD       thiamine (B-1) injection 100 mg  100 mg Intravenous Daily Curatolo, Adam, DO   100 mg at 07/24/21 1210    Allergies as of  07/24/2021   (No Known Allergies)    Social History   Socioeconomic History   Marital status: Divorced    Spouse name: Not on file   Number of children: Not on file   Years of education: Not on file   Highest education level: Not on file  Occupational History   Not on file  Tobacco Use   Smoking status: Every Day    Packs/day: 0.50    Types: Cigarettes   Smokeless tobacco: Never  Substance and Sexual Activity   Alcohol use: Yes    Alcohol/week: 16.0 standard drinks of alcohol    Types: 16 Glasses of wine per week    Comment: daily (1.5 liter bottle wine every 12)   Drug use: Yes    Types: Cocaine, Marijuana   Sexual activity: Yes    Partners: Male    Comment: female partner  Other Topics Concern   Not on file  Social History Narrative   Divorced. Homemaker. HS grad.    Lives with life partner (stan)   Drinks caffeine.   Wears seatbelt. Smoke dectector in the home. No firearms in the home.    Feels safe in her relationships.     Social Determinants of Health   Financial Resource Strain: Not on file  Food Insecurity: Not on file  Transportation Needs: Not on file  Physical Activity: Not on file  Stress: Not on file  Social Connections: Not on file  Intimate Partner Violence: Not on file    Review of Systems: All systems reviewed and negative except where noted in HPI.  Physical Exam: Vital signs in last 24 hours: Temp:  [97.9 F (36.6 C)-98.8 F (37.1 C)] 98.5 F (36.9 C) (06/29 1436) Pulse Rate:  [104-118] 111 (06/29 1245) Resp:  [16-30] 18 (06/29 1245) BP: (97-127)/(66-82) 97/66 (06/29 1436) SpO2:  [88 %-96 %] 94 % (06/29 1436) Last BM Date : 07/23/21  General:  Alert female in NAD Psych:  Pleasant, cooperative. Normal mood and affect Eyes: Pupils equal, no icterus. Conjunctive pink Ears:  Normal auditory acuity Nose: No deformity, discharge or lesions Neck:  Supple, no masses felt Lungs:  Clear to auscultation.  Heart:  Regular rate, regular  rhythm. No lower extremity edema Abdomen:  Soft, nondistended, minimal epigastric tenderness. Active bowel sounds, no masses  felt Rectal :  Deferred Msk: Symmetrical without gross deformities.  Neurologic:  Alert, oriented, grossly normal neurologically Skin:  Intact without significant lesions.    Intake/Output from previous day: No intake/output data recorded. Intake/Output this shift:  Total I/O In: 1000 [IV Piggyback:1000] Out: -   Principal Problem:   Pancreatic cancer ([place])    Tye Savoy, NP-C @  07/24/2021, 3:39 PM  GI ATTENDING  History, laboratories, x-rays reviewed.  Patient seen and examined.  Agree with comprehensive consultation note as outlined above.  Patient presents with acute pancreatitis, likely due to alcohol.  Large fluid collection, duration unknown, compressing the stomach.  Imaging raise the question of underlying pancreatic mass.  Difficult to tell in the midst of acute pancreatitis, but possible.  Agree with aggressive supportive measures as with pancreatitis in general.  Currently n.p.o., pain control, IV fluids with vigorous hydration, DVT prophylaxis and incentive spirometry.  We will follow.  Docia Chuck. Geri Seminole., M.D. [facility] Division of Gastroenterology

## 2021-07-24 NOTE — Progress Notes (Signed)
[facility] admits paged. DR Manon Hilding is the admitting per [facility] admits. Dr Marcello Moores made aware of the admission. Awaiting MD.

## 2021-07-24 NOTE — ED Notes (Signed)
Consult called for gi/[place] AOM 10:19

## 2021-07-24 NOTE — Progress Notes (Signed)
This is 61 year old lady with history of anxiety, depression, GERD and heavy daily alcohol user presented to ED with complaint of abdominal pain for last couple of days.  She has been unable to keep the food down but has been drinking alcohol on daily basis.  CT abdomen and pelvis concerning for possible acute pancreatitis, pancreatic duct dilatation and high suspicion for possible newly diagnosed pancreatic cancer.  Patient has established care with [facility] and per ED physician, GI PA Tye Savoy is aware of the patient and will see in consultation but requested admission under hospital service.  Patient has been accepted to rehab versus going or [facility] long.  ED physician to continue to care for the patient until patient arrives to either of the campuses and then [facility] will resume care.

## 2021-07-24 NOTE — ED Provider Notes (Addendum)
[facility] EMERGENCY DEPT Provider Note   CSN: [id number] Arrival date & time: 07/24/21  8469     History  Chief Complaint  Patient presents with   Abdominal Pain    Kathryn Underwood is a 61 y.o. female.  Is here with abdominal pain for the last 4 to 5 days.  She has had some dark stools as well.  History of heavy alcohol use.  Daily drinker.  Denies any history of ulcer.  No history of pancreatitis or bowel obstruction or abdominal surgeries.  She states that pain has improved over the last 24 hours.  She has not had much to eat or drink other than alcohol.  She saw her primary care doctor yesterday who recommended that she come for evaluation as concern for process in her abdomen.  She denies any fevers or chills.  She maybe has had some constipation.  Denies any nausea or vomiting.  No chest pain or shortness of breath but she has had a bad cough.  She is an everyday smoker as well.  The history is provided by the patient.       Home Medications Prior to Admission medications   Medication Sig Start Date End Date Taking? Authorizing Provider  chlordiazePOXIDE (LIBRIUM) 25 MG capsule '50mg'$  PO TID x 1D, then 25-'50mg'$  PO BID X 1D, then 25-'50mg'$  PO QD X 1D Patient not taking: Reported on 07/23/2021 03/24/18   Isla Pence, MD  cyclobenzaprine (FLEXERIL) 10 MG tablet Take 1 tablet (10 mg total) by mouth 3 (three) times daily as needed for muscle spasms. Patient not taking: Reported on 07/23/2021 10/09/17   Malvin Johns, MD  folic acid (FOLVITE) 1 MG tablet Take 1 tablet (1 mg total) by mouth daily. Patient not taking: Reported on 07/23/2021 04/29/16   Raiford Noble Latif, DO  gabapentin (NEURONTIN) 300 MG capsule Take 2 capsules (600 mg total) by mouth 2 (two) times daily. 10/09/17   Malvin Johns, MD  mirtazapine (REMERON) 30 MG tablet Take 30 mg by mouth at bedtime. 02/05/18   [provider]  mirtazapine (REMERON) 45 MG tablet Take 45 mg by mouth at bedtime.  07/17/21   [provider]  Multiple Vitamin (MULTIVITAMIN WITH MINERALS) TABS tablet Take 1 tablet by mouth daily. 10/09/17   Georgette Shell, MD  nicotine (NICODERM CQ - DOSED IN MG/24 HOURS) 21 mg/24hr patch Place 1 patch (21 mg total) onto the skin daily. Patient not taking: Reported on 07/23/2021 10/09/17   Georgette Shell, MD  pantoprazole (PROTONIX) 20 MG tablet Take 1 tablet (20 mg total) by mouth daily. Patient not taking: Reported on 07/23/2021 09/20/18   Caccavale, Sophia, PA-C  potassium chloride SA (K-DUR,KLOR-CON) 20 MEQ tablet Take 1 tablet (20 mEq total) by mouth daily. Patient not taking: Reported on 07/23/2021 10/09/17   Malvin Johns, MD  QUEtiapine (SEROQUEL) 50 MG tablet Take by mouth. 07/17/21   [provider]  sucralfate (CARAFATE) 1 GM/10ML suspension Take 10 mLs (1 g total) by mouth 4 (four) times daily -  with meals and at bedtime. Patient not taking: Reported on 07/23/2021 09/20/18   Caccavale, Sophia, PA-C  thiamine 100 MG tablet Take 1 tablet (100 mg total) by mouth daily. Patient not taking: Reported on 07/23/2021 10/09/17   Georgette Shell, MD      Allergies    Patient has no known allergies.    Review of Systems   Review of Systems  Physical Exam Updated Vital Signs BP 111/68  Pulse (!) 107   Temp 98.8 F (37.1 C) (Oral)   Resp 19   SpO2 93%  Physical Exam Vitals and nursing note reviewed.  Constitutional:      General: She is not in acute distress.    Appearance: She is well-developed.  HENT:     Head: Normocephalic and atraumatic.     Mouth/Throat:     Mouth: Mucous membranes are moist.  Eyes:     Extraocular Movements: Extraocular movements intact.     Conjunctiva/sclera: Conjunctivae normal.  Cardiovascular:     Rate and Rhythm: Regular rhythm. Tachycardia present.     Heart sounds: Normal heart sounds. No murmur heard. Pulmonary:     Effort: Pulmonary effort is normal. No respiratory distress.     Breath sounds:  Normal breath sounds.  Abdominal:     General: Abdomen is protuberant. There is distension.     Palpations: Abdomen is soft.     Tenderness: There is generalized abdominal tenderness. There is no guarding.  Musculoskeletal:        General: No swelling.     Cervical back: Neck supple.  Skin:    General: Skin is warm and dry.     Capillary Refill: Capillary refill takes less than 2 seconds.  Neurological:     Mental Status: She is alert.  Psychiatric:        Mood and Affect: Mood normal.     ED Results / Procedures / Treatments   Labs (all labs ordered are listed, but only abnormal results are displayed) Labs Reviewed  CBC WITH DIFFERENTIAL/PLATELET - Abnormal; Notable for the following components:      Result Value   WBC 11.3 (*)    MCV 103.0 (*)    MCH 34.3 (*)    Platelets 621 (*)    Monocytes Absolute 1.2 (*)    All other components within normal limits  COMPREHENSIVE METABOLIC PANEL - Abnormal; Notable for the following components:   Chloride 96 (*)    Glucose, Bld 115 (*)    BUN 7 (*)    Total Protein 6.4 (*)    Albumin 3.4 (*)    All other components within normal limits  LIPASE, BLOOD - Abnormal; Notable for the following components:   Lipase 109 (*)    All other components within normal limits  OCCULT BLOOD X 1 CARD TO LAB, STOOL - Abnormal; Notable for the following components:   Fecal Occult Bld POSITIVE (*)    All other components within normal limits  URINALYSIS, ROUTINE W REFLEX MICROSCOPIC - Abnormal; Notable for the following components:   APPearance HAZY (*)    Protein, ur TRACE (*)    Bacteria, UA FEW (*)    All other components within normal limits  SARS CORONAVIRUS 2 BY RT PCR  TROPONIN I (HIGH SENSITIVITY)    EKG EKG Interpretation  Date/Time:  Thursday July 24 2021 08:24:21 EDT Ventricular Rate:  114 PR Interval:  164 QRS Duration: 86 QT Interval:  338 QTC Calculation: 466 R Axis:   102 Text Interpretation: Sinus tachycardia Low  voltage with right axis deviation Confirmed by Lennice Sites ([id number]) on 07/24/2021 8:32:30 AM  Radiology CT ABDOMEN PELVIS W CONTRAST  Result Date: 07/24/2021 CLINICAL DATA:  Abdominal pain, acute.  Black diarrhea. EXAM: CT ABDOMEN AND PELVIS WITH CONTRAST TECHNIQUE: Multidetector CT imaging of the abdomen and pelvis was performed using the standard protocol following bolus administration of intravenous contrast. RADIATION DOSE REDUCTION: This exam was performed according to  the departmental dose-optimization program which includes automated exposure control, adjustment of the mA and/or kV according to patient size and/or use of iterative reconstruction technique. CONTRAST:  1m OMNIPAQUE IOHEXOL 300 MG/ML  SOLN COMPARISON:  None Available. FINDINGS: Lower chest: Lung bases are clear. Hepatobiliary: Decreased attenuation of the liver is compatible with steatosis. Normal appearance of the gallbladder. There is a poorly defined low-density structure along the inferior right hepatic lobe adjacent to the gallbladder. This area measures approximately 2.2 cm. Additional small hypodensities throughout the liver. Some of these hypodensities could represent cysts but too small to definitively characterize. Trace perihepatic ascites. Main portal venous system is patent. No biliary dilatation. Normal appearance of the gallbladder. Pancreas: Poorly defined hypodensity in the pancreatic neck region, best seen on sequence 2 image 30. The pancreatic duct is dilated distal to this area of decreased attenuation in the pancreas. Normal appearance of the pancreatic head and uncinate process. Stranding anterior to the pancreatic body and there is fluid collection with peripheral enhancement around the distal pancreatic body region that measures 2.7 cm in with sequence 2 image 31. Spleen: Normal in size without focal abnormality. Adrenals/Urinary Tract: Normal appearance of the adrenal glands. Normal appearance of the urinary  bladder. Normal appearance of both kidneys without stones, hydronephrosis or suspicious renal lesions. Stomach/Bowel: Massive low-density cystic lesion along the posterior aspect of the stomach which is markedly compressing the stomach lumen. This collection measures 23.9 x 9.1 x 10.5 cm. Normal appearance of the duodenum. There is no significant dilatation of the distal esophagus. No small bowel or colonic dilatation. Vascular/Lymphatic: Atherosclerosis in the abdominal aorta without aneurysm or dissection. No significant aortic stenosis. No significant lymph node enlargement in the abdomen or pelvis. Reproductive: Calcified uterine fibroids. No evidence for an adnexal lesion. Other: Small amount of free fluid in the right hemipelvis near the right adnexa. Mild mesenteric edema and indeterminate low-density material along the left side of the abdomen on sequence 2 image 35 and just anterior to stomach and image number 35. These areas of low-density could represent trace fluid but difficult to exclude subtle peritoneal neoplastic disease. Musculoskeletal: No suspicious osseous findings. IMPRESSION: 1. Focal abnormality in the pancreatic body/neck region with dilatation of the pancreatic duct distal to this area. Findings raise concern for an underlying neoplastic lesion although focal inflammation from pancreatitis is also in the differential diagnosis. There are changes compatible with pancreatitis including stranding anterior to the pancreas with a fluid collection around the pancreatic body and a massive fluid collection involving the stomach which is causing severe compression of the stomach lumen. These findings could be secondary to pancreatitis with a massive pseudocyst formation but need to exclude a neoplastic process in the pancreatic neck region. Recommend GI consultation. 2. Small amount of fluid and stranding in the abdomen and pelvis which could be associated with pancreatitis. However, some of these  areas of peritoneal low density are indeterminate and peritoneal neoplastic disease can not be excluded depending on the etiology of the pancreatic lesion. 3. Hepatic steatosis with scattered hypodensities in the liver. Some of these hypodensities could represent cysts or even areas of focal fat. Subtle hepatic lesions cannot be excluded and could be better characterized with a liver MRI. 4. Uterine fibroids. These results were called by telephone at the time of interpretation on 07/24/2021 at 10:09 am to provider Anmol Fleck , who verbally acknowledged these results. Electronically Signed   By: AMarkus DaftM.D.   On: 07/24/2021 10:13   DG Chest  Portable 1 View  Result Date: 07/24/2021 CLINICAL DATA:  Cough EXAM: PORTABLE CHEST 1 VIEW COMPARISON:  Chest radiograph dated October 04, 2017 FINDINGS: The heart size and mediastinal contours are within normal limits. Both lungs are clear without evidence of focal consolidation or pleural effusion. The visualized skeletal structures are unremarkable. IMPRESSION: No active disease. Electronically Signed   By: Keane Police D.O.   On: 07/24/2021 08:49    EMERGENCY DEPARTMENT [place] ACITES EXAM "Study: Limited Abdominal Ultrasound for Evaluation of Free Fluid"  INDICATIONS: Abd pain and Distention  PERFORMED BY: Myself IMAGES ARCHIVED?: No VIEWS USES: Right upper quad, Left upper quad, Right lower quad, and Left lower quad INTERPRETATION: Free fluid not present    Procedures Procedures    Medications Ordered in ED Medications  LORazepam (ATIVAN) injection 1 mg (1 mg Intravenous Given 07/24/21 0854)  sodium chloride 0.9 % bolus 1,000 mL (1,000 mLs Intravenous New Bag/Given 07/24/21 0853)  pantoprazole (PROTONIX) injection 40 mg (40 mg Intravenous Given 07/24/21 0903)  iohexol (OMNIPAQUE) 300 MG/ML solution 100 mL (80 mLs Intravenous Contrast Given 07/24/21 0933)    ED Course/ Medical Decision Making/ A&P                           Medical Decision  Making Amount and/or Complexity of Data Reviewed Labs: ordered. Radiology: ordered.  Risk Prescription drug management. Decision regarding hospitalization.   Kathryn Underwood is here with abdominal pain.  Patient tachycardic to 118 but otherwise unremarkable vitals.  In the room she did have some oxygen levels in the 88-92 range.  Placed on 2 L of oxygen.  Current every day smoker has had a dry cough.  Has not felt short of breath.  No chest pain.  She has been having diffuse abdominal pain over the last 5 to 6 days.  She is having dark stools.  History of heavy alcohol use, daily drinking.  Pain has improved over the last day as she is only had alcohol and nothing to eat or drink otherwise.  She seen primary care doctor yesterday who recommended she come for evaluation.  She came today.  Pain is diffuse in her abdomen.  Bedside ultrasound showed no obvious ascites.  Differential diagnosis is pancreatitis versus gastritis versus GI bleed versus bowel obstruction versus alcoholic hepatitis versus perforation.  Stool looks grossly brown on exam.  We will send Hemoccult.  Will check CBC, CMP, lipase, urinalysis, chest x-ray, COVID test, CT scan abdomen pelvis.  Her oxygen level is in the low 90s for the most part suspect that could be from some undiagnosed COPD.  Could be from bronchitis/infectious process.  I do not have any concern for cardiac process at this time.  EKG per my review and interpretation shows sinus tachycardia.  No ischemic changes.  We will give a dose of Ativan and IV fluids for pain control.  We will reevaluate after work-up.  Per my review and interpretation of labs, hemoglobin is 14.8.  Troponin is normal.  COVID test negative.  Lipase mildly elevated at 110.  Gallbladder and liver enzymes within normal limits.  Radiology called me on the phone to discuss CT findings.  Patient does have focal abnormality in the pancreatic body and neck with dilation of the pancreatic duct distal to  this area.  Findings are concerning for cancerous process although could be focal inflammation from pancreatitis.  There are changes compatible with pancreatitis including stranding anterior to the pancreas  with a fluid collection around the pancreatic body and massive fluid collection involving the stomach which is causing severe compression of the stomach lumen.  These findings could be secondary to pancreatitis with a massive pseudocyst formation but difficult to exclude neoplastic process.  There is some stranding within the peritoneal space as well.  [facility] aware, talk to [facility], will consult.  This chart was dictated using voice recognition software.  Despite best efforts to proofread,  errors can occur which can change the documentation meaning.         Final Clinical Impression(s) / ED Diagnoses Final diagnoses:  Alcohol-induced acute pancreatitis, unspecified complication status  Pancreatic cyst    Rx / DC Orders ED Discharge Orders     None         Lennice Sites, DO 07/24/21 [place], [place], DO 07/24/21 1039

## 2021-07-25 DIAGNOSIS — R935 Abnormal findings on diagnostic imaging of other abdominal regions, including retroperitoneum: Secondary | ICD-10-CM

## 2021-07-25 DIAGNOSIS — K862 Cyst of pancreas: Secondary | ICD-10-CM | POA: Diagnosis present

## 2021-07-25 DIAGNOSIS — R911 Solitary pulmonary nodule: Secondary | ICD-10-CM | POA: Diagnosis present

## 2021-07-25 DIAGNOSIS — R1010 Upper abdominal pain, unspecified: Secondary | ICD-10-CM

## 2021-07-25 DIAGNOSIS — K863 Pseudocyst of pancreas: Secondary | ICD-10-CM | POA: Diagnosis present

## 2021-07-25 DIAGNOSIS — K853 Drug induced acute pancreatitis without necrosis or infection: Secondary | ICD-10-CM

## 2021-07-25 LAB — COMPREHENSIVE METABOLIC PANEL
ALT: 14 U/L (ref 0–44)
AST: 24 U/L (ref 15–41)
Albumin: 2.4 g/dL — ABNORMAL LOW (ref 3.5–5.0)
Alkaline Phosphatase: 102 U/L (ref 38–126)
Anion gap: 6 (ref 5–15)
BUN: <5 mg/dL — ABNORMAL LOW (ref 8–23)
CO2: 29 mmol/L (ref 22–32)
Calcium: 8.4 mg/dL — ABNORMAL LOW (ref 8.9–10.3)
Chloride: 102 mmol/L (ref 98–111)
Creatinine, Ser: 0.51 mg/dL (ref 0.44–1.00)
GFR, Estimated: >60 mL/min (ref >60)
Glucose, Bld: 93 mg/dL (ref 70–99)
Potassium: 3.9 mmol/L (ref 3.5–5.1)
Sodium: 137 mmol/L (ref 135–145)
Total Bilirubin: 0.5 mg/dL (ref 0.3–1.2)
Total Protein: 4.7 g/dL — ABNORMAL LOW (ref 6.5–8.1)

## 2021-07-25 LAB — HEMOGLOBIN AND HEMATOCRIT, BLOOD
HCT: 39.5 % (ref 36.0–46.0)
HCT: 39.7 % (ref 36.0–46.0)
Hemoglobin: 12.7 g/dL (ref 12.0–15.0)
Hemoglobin: 13.4 g/dL (ref 12.0–15.0)

## 2021-07-25 LAB — CBC
HCT: 40.8 % (ref 36.0–46.0)
Hemoglobin: 13.6 g/dL (ref 12.0–15.0)
MCH: 35.4 pg — ABNORMAL HIGH (ref 26.0–34.0)
MCHC: 33.3 g/dL (ref 30.0–36.0)
MCV: 106.3 fL — ABNORMAL HIGH (ref 80.0–100.0)
Platelets: 541 10*3/uL — ABNORMAL HIGH (ref 150–400)
RBC: 3.84 MIL/uL — ABNORMAL LOW (ref 3.87–5.11)
RDW: 13.9 % (ref 11.5–15.5)
WBC: 11 10*3/uL — ABNORMAL HIGH (ref 4.0–10.5)
nRBC: 0 % (ref 0.0–0.2)

## 2021-07-25 LAB — PROTIME-INR
INR: 1.1 (ref 0.8–1.2)
Prothrombin Time: 13.9 seconds (ref 11.4–15.2)

## 2021-07-25 NOTE — Progress Notes (Signed)
  Progress Note Patient: Kathryn Underwood ZHG:[id number] DOB: [birth year] DOA: 07/24/2021  DOS: the patient was seen and examined on 07/25/2021  Brief hospital course: 61 year old female with past medical history of anxiety, depression, GERD, alcohol use drinks 1 wine bottle per day presented with abdominal pain ongoing for 2 weeks. Found to have acute pancreatitis as well as pancreatic cyst. GI consulted. Monitor recommendation.  Currently diet advanced. Assessment and Plan: Acute alcoholic pancreatitis with pancreatic pseudocyst. GI consulted. Patient drinks 1 wine bottle a day. CT scan shows evidence of pancreatitis as well as possible pancreatic lesion. Also evidence of large pseudocyst with compression of the stomach. Current plan is to advance to diet. Currently on liquid diet.  Tolerating well. If the pain is adequate and the patient is able to tolerate adequate calories patient will most likely require outpatient follow-up for her pseudocyst and further work-up. Continue IV fluids, IV nausea medication.  Monitor.  Alcohol abuse. Drinks 1 bottle of wine a day. Last drink was 1 day before admission. Currently on CIWA protocol. Monitor.  Concern for malignant pancreatic lesion. Outpatient follow-up with GI.  Please COPD based on the CT scan. Pulmonary nodule. Continue inhalers. Monitor weight  Anxiety. Continue home regimen.  GERD. Continue PPI.  Subjective: No nausea no fever no chills.  Does have abdominal pain.  Passing gas.  Physical Exam: Vitals:   07/25/21 0753 07/25/21 0801 07/25/21 1141 07/25/21 1607  BP: 100/67  114/74 105/60  Pulse: 99 (!) 101 (!) 108 (!) 109  Resp: '18 16 19 18  '$ Temp: 98.3 F (36.8 C)   98.4 F (36.9 C)  TempSrc:    Oral  SpO2: 93% 96% 97% 95%   General: Appear in mild distress; no visible Abnormal Neck Mass Or lumps, Conjunctiva normal Cardiovascular: S1 and S2 Present, no Murmur, Respiratory: good respiratory effort, Bilateral Air  entry present and CTA, no Crackles, no wheezes Abdomen: Bowel Sound present, diffusely tender and distended Extremities: no Pedal edema Neurology: alert and oriented x3 Gait not checked due to patient safety concerns   Data Reviewed: I have Reviewed nursing notes, Vitals, and Lab results since pt's last encounter. Pertinent lab results CBC and CMP I have ordered test including CBC and CMP I have discussed pt's care plan and test results with GI.   Family Communication: None at bedside  Disposition: Status is: Inpatient Remains inpatient appropriate because: Currently receiving IV fluids for acute pancreatitis.  Still clears only.  Author: Berle Mull, MD 07/25/2021 6:17 PM  Please look on [email or web address] to find out who is on call.

## 2021-07-25 NOTE — Hospital Course (Signed)
61 year old female with past medical history of anxiety, depression, GERD, alcohol use drinks 1 wine bottle per day presented with abdominal pain ongoing for 2 weeks. Found to have acute pancreatitis as well as pancreatic cyst. GI consulted.conservative measures initiated. Currently diet advanced to clear liquid diet but appears to have more pain on 7/1.

## 2021-07-25 NOTE — TOC CAGE-AID Note (Signed)
Transition of Care [facility]) - CAGE-AID Screening   Patient Details  Name: Kathryn Underwood MRN: [id number] Date of Birth: 11/24/1960  Transition of [facility]) CM/SW Contact:    Marilu Favre, RN Phone Number: 07/25/2021, 10:22 AM   Clinical Narrative:    CAGE-AID Screening:    Have You Ever Felt You Ought to Cut Down on Your Drinking or Drug Use?: Yes Have People Annoyed You By Critizing Your Drinking Or Drug Use?: Yes Have You Felt Bad Or Guilty About Your Drinking Or Drug Use?: Yes Have You Ever Had a Drink or Used Drugs First Thing In The Morning to Steady Your Nerves or to Get Rid of a Hangover?: Yes CAGE-AID Score: 4  Substance Abuse Education Offered: Yes  Substance abuse interventions: Patient Counseling

## 2021-07-25 NOTE — Progress Notes (Addendum)
Daily Progress Note  Hospital Day: 2  Chief Complaint:  abdominal pain   Brief History Kathryn Underwood is a 61 y.o. female with a pmh not limited to anxiety, depression, Etoh abuse, pancreatitis   Assessment   # 61 yo female with acute pancreatitis, likely related to Etoh. Associated large fluid collection causing gastric compression, possibly an evolving pseudocyst. Also with concern for pancreatic neoplasm on imaging but this can be difficult to evaluate in setting of acute pancreatitis.   Afebrile. Hct 40.8%. Renal function normal.  No significant abdominal pain, not requiring much pain medication  # Intermittent dark stools / FOBT + . No evidence for significant GI bleeding. Could have esophagitis, erosive gastritis or PUD. Interval history: Hgb is down to baseline ( 12.7) with IV hydration. Normal BUN.   # Hypoxia ( present on admission) with concern for COPD. Chest CT scan yesterday minimal lingular atelectasis/airspace disease and a 10 mm left ground-glass pulmonary nodule within the left upper lobe Getting solumedrol, Azithromycin and Rocephin Says breathing is better today. On supplemental 02  # Sinus tachycardia. Normotensive. Afebrile. Doubt volume related at this point.  Maybe anxiety, she is asking for her dose of ativan  Plan   IVF have been discontinued.  Not requiring much pain medication. Will try clear liquids Continue BID PPI.   Subjective   Feels better today. No significant abdominal pain. Breather easier.   Objective   Imaging:  CT CHEST W CONTRAST  Result Date: 07/24/2021 CLINICAL DATA:  COPD exacerbation. EXAM: CT CHEST WITH CONTRAST TECHNIQUE: Multidetector CT imaging of the chest was performed during intravenous contrast administration. RADIATION DOSE REDUCTION: This exam was performed according to the departmental dose-optimization program which includes automated exposure control, adjustment of the mA and/or kV according to patient size  and/or use of iterative reconstruction technique. CONTRAST:  38m OMNIPAQUE IOHEXOL 350 MG/ML SOLN COMPARISON:  CT abdomen and pelvis 07/24/2021 FINDINGS: Cardiovascular: No significant vascular findings. Normal heart size. No pericardial effusion. Coronary artery calcifications are present. Mediastinum/Nodes: No enlarged mediastinal, hilar, or axillary lymph nodes. Thyroid gland, trachea, and esophagus demonstrate no significant findings. Lungs/Pleura: There is a small amount of patchy atelectasis/airspace disease in the lingula. There are atelectatic changes in both lower lobes. There is no pleural effusion or pneumothorax. Ground-glass nodular density is seen in the left lung apex measuring 1 cm. Trachea and central airways are within normal limits. Upper Abdomen: Cystic lesion in the left upper quadrant is partially imaged. Please see CT abdomen and pelvis performed same day for further description. Musculoskeletal: No chest wall abnormality. No acute or significant osseous findings. IMPRESSION: 1. Minimal lingular atelectasis/airspace disease correlate clinically for infection. 2. 10 mm left ground-glass pulmonary nodule within the upper lobe. Recommend a non-contrast Chest CT at 6-12 months to confirm persistence, then additional non-contrast Chest CTs every 2 years until 5 years. If nodule grows or develops solid component(s), consider resection. These guidelines do not apply to immunocompromised patients and patients with cancer. Follow up in patients with significant comorbidities as clinically warranted. For lung cancer screening, adhere to Lung-RADS guidelines. Reference: Radiology. 2017; 284(1):228-43. Electronically Signed   By: ARonney AstersM.D.   On: 07/24/2021 23:31   CT ABDOMEN PELVIS W CONTRAST  Result Date: 07/24/2021 CLINICAL DATA:  Abdominal pain, acute.  Black diarrhea. EXAM: CT ABDOMEN AND PELVIS WITH CONTRAST TECHNIQUE: Multidetector CT imaging of the abdomen and pelvis was performed  using the standard protocol following bolus administration of intravenous contrast.  RADIATION DOSE REDUCTION: This exam was performed according to the departmental dose-optimization program which includes automated exposure control, adjustment of the mA and/or kV according to patient size and/or use of iterative reconstruction technique. CONTRAST:  31m OMNIPAQUE IOHEXOL 300 MG/ML  SOLN COMPARISON:  None Available. FINDINGS: Lower chest: Lung bases are clear. Hepatobiliary: Decreased attenuation of the liver is compatible with steatosis. Normal appearance of the gallbladder. There is a poorly defined low-density structure along the inferior right hepatic lobe adjacent to the gallbladder. This area measures approximately 2.2 cm. Additional small hypodensities throughout the liver. Some of these hypodensities could represent cysts but too small to definitively characterize. Trace perihepatic ascites. Main portal venous system is patent. No biliary dilatation. Normal appearance of the gallbladder. Pancreas: Poorly defined hypodensity in the pancreatic neck region, best seen on sequence 2 image 30. The pancreatic duct is dilated distal to this area of decreased attenuation in the pancreas. Normal appearance of the pancreatic head and uncinate process. Stranding anterior to the pancreatic body and there is fluid collection with peripheral enhancement around the distal pancreatic body region that measures 2.7 cm in with sequence 2 image 31. Spleen: Normal in size without focal abnormality. Adrenals/Urinary Tract: Normal appearance of the adrenal glands. Normal appearance of the urinary bladder. Normal appearance of both kidneys without stones, hydronephrosis or suspicious renal lesions. Stomach/Bowel: Massive low-density cystic lesion along the posterior aspect of the stomach which is markedly compressing the stomach lumen. This collection measures 23.9 x 9.1 x 10.5 cm. Normal appearance of the duodenum. There is no  significant dilatation of the distal esophagus. No small bowel or colonic dilatation. Vascular/Lymphatic: Atherosclerosis in the abdominal aorta without aneurysm or dissection. No significant aortic stenosis. No significant lymph node enlargement in the abdomen or pelvis. Reproductive: Calcified uterine fibroids. No evidence for an adnexal lesion. Other: Small amount of free fluid in the right hemipelvis near the right adnexa. Mild mesenteric edema and indeterminate low-density material along the left side of the abdomen on sequence 2 image 35 and just anterior to stomach and image number 35. These areas of low-density could represent trace fluid but difficult to exclude subtle peritoneal neoplastic disease. Musculoskeletal: No suspicious osseous findings. IMPRESSION: 1. Focal abnormality in the pancreatic body/neck region with dilatation of the pancreatic duct distal to this area. Findings raise concern for an underlying neoplastic lesion although focal inflammation from pancreatitis is also in the differential diagnosis. There are changes compatible with pancreatitis including stranding anterior to the pancreas with a fluid collection around the pancreatic body and a massive fluid collection involving the stomach which is causing severe compression of the stomach lumen. These findings could be secondary to pancreatitis with a massive pseudocyst formation but need to exclude a neoplastic process in the pancreatic neck region. Recommend GI consultation. 2. Small amount of fluid and stranding in the abdomen and pelvis which could be associated with pancreatitis. However, some of these areas of peritoneal low density are indeterminate and peritoneal neoplastic disease can not be excluded depending on the etiology of the pancreatic lesion. 3. Hepatic steatosis with scattered hypodensities in the liver. Some of these hypodensities could represent cysts or even areas of focal fat. Subtle hepatic lesions cannot be  excluded and could be better characterized with a liver MRI. 4. Uterine fibroids. These results were called by telephone at the time of interpretation on 07/24/2021 at 10:09 am to provider ADAM CURATOLO , who verbally acknowledged these results. Electronically Signed   By: AScherrie GerlachD.  On: 07/24/2021 10:13   DG Chest Portable 1 View  Result Date: 07/24/2021 CLINICAL DATA:  Cough EXAM: PORTABLE CHEST 1 VIEW COMPARISON:  Chest radiograph dated October 04, 2017 FINDINGS: The heart size and mediastinal contours are within normal limits. Both lungs are clear without evidence of focal consolidation or pleural effusion. The visualized skeletal structures are unremarkable. IMPRESSION: No active disease. Electronically Signed   By: Keane Police D.O.   On: 07/24/2021 08:49    Lab Results: Recent Labs    07/24/21 0827 07/24/21 1703 07/25/21 0027  WBC 11.3* 13.9* 11.0*  HGB 14.8 15.1* 13.6  HCT 44.4 45.0 40.8  PLT 621* 608* 541*   BMET Recent Labs    07/24/21 0827 07/24/21 1703 07/25/21 0027  NA 137 138 137  K 3.7 3.8 3.9  CL 96* 100 102  CO2 '29 28 29  '$ GLUCOSE 115* 111* 93  BUN 7* 5* <5*  CREATININE 0.45 0.54 0.51  CALCIUM 9.0 8.6* 8.4*   LFT Recent Labs    07/25/21 0027  PROT 4.7*  ALBUMIN 2.4*  AST 24  ALT 14  ALKPHOS 102  BILITOT 0.5   PT/INR Recent Labs    07/25/21 0027  LABPROT 13.9  INR 1.1     Scheduled inpatient medications:   folic acid  1 mg Intravenous Daily   ipratropium-albuterol  3 mL Nebulization BID   [START ON 07/26/2021] LORazepam  0-4 mg Intravenous Q12H   Or   [START ON 07/26/2021] LORazepam  0-4 mg Oral Q12H   LORazepam  0-4 mg Intravenous Q6H   Or   LORazepam  0-4 mg Oral Q6H   methylPREDNISolone (SOLU-MEDROL) injection  40 mg Intravenous Q12H   multivitamin with minerals  1 tablet Oral Daily   nicotine  21 mg Transdermal Daily   pantoprazole (PROTONIX) IV  40 mg Intravenous Q12H   thiamine  100 mg Intravenous Daily   Continuous inpatient  infusions:   azithromycin 500 mg (07/25/21 0041)   cefTRIAXone (ROCEPHIN)  IV 1 g (07/25/21 0051)   lactated ringers 150 mL/hr at 07/25/21 0728   PRN inpatient medications: acetaminophen **OR** acetaminophen, albuterol, HYDROmorphone (DILAUDID) injection, ondansetron **OR** ondansetron (ZOFRAN) IV  Vital signs in last 24 hours: Temp:  [97.9 F (36.6 C)-98.5 F (36.9 C)] 98.3 F (36.8 C) (06/30 0753) Pulse Rate:  [96-117] 101 (06/30 0801) Resp:  [16-30] 16 (06/30 0801) BP: (97-127)/(62-80) 100/67 (06/30 0753) SpO2:  [93 %-99 %] 96 % (06/30 0801) Last BM Date : 07/24/21  Intake/Output Summary (Last 24 hours) at 07/25/2021 0941 Last data filed at 07/24/2021 1900 Gross per 24 hour  Intake 1000 ml  Output --  Net 1000 ml     Physical Exam:  General: Alert female in NAD Heart:  Regular rate and rhythm. No lower extremity edema Pulmonary: Normal respiratory effort Abdomen: Soft, nondistended, nontender. Normal bowel sounds.  Neurologic: Alert and oriented Psych: Pleasant. Cooperative.    Intake/Output from previous day: 06/29 0701 - 06/30 0700 In: 1000 [IV Piggyback:1000] Out: -  Intake/Output this shift: No intake/output data recorded.    Principal Problem:   Pancreatic cancer [facility]) Active Problems:   Acute pancreatitis   Abnormal CT of the abdomen   Pain of upper abdomen     LOS: 1 day   Tye Savoy ,NP 07/25/2021, 9:41 AM  GI ATTENDING  Interval history data reviewed.  Patient seen and examined.  Agree with interval progress note as outlined above.  Patient is stable.  Less pain.  No vomiting.  We will try clear liquid diet.  We will follow.  She will be ready for discharge when she is able to take a reasonable amount of calories by mouth and is not experiencing significant pain.  Home program.  She will need outpatient follow-up and serial imaging over time.  We will continue to follow.  Docia Chuck. Geri Seminole., M.D. [facility] Division of  Gastroenterology

## 2021-07-25 NOTE — Plan of Care (Signed)
  Problem: Nutrition: Goal: Adequate nutrition will be maintained Outcome: Progressing   Problem: Pain Managment: Goal: General experience of comfort will improve Outcome: Progressing   Problem: Safety: Goal: Ability to remain free from injury will improve Outcome: Progressing   

## 2021-07-25 NOTE — Progress Notes (Signed)
Mobility Specialist Progress Note:   07/25/21 1530  Mobility  Activity Transferred to/from [facility]  Level of Assistance Standby assist, set-up cues, supervision of patient - no hands on  Assistive Device None  Distance Ambulated (ft) 4 ft  Activity Response Tolerated well  $Mobility charge 1 Mobility   Responded to bed alarm, pt found transferring self to [facility]. Required no physical assist throughout. Pt states she wants to go home. Pt back in bed with all needs met. Bed alarm on.   Nelta Numbers Acute Rehab Secure Chat or Office Phone: [phone]

## 2021-07-25 NOTE — TOC Initial Note (Signed)
Transition of Care [facility]) - Initial/Assessment Note    Patient Details  Name: Kathryn Underwood MRN: [id number] Date of Birth: [birth year]  Transition of Care [facility]) CM/SW Contact:    Marilu Favre, RN Phone Number: 07/25/2021, 10:30 AM  Clinical Narrative:                 Patient from home with partner.  Confirmed face sheet information.   Patient just had a appointment with PCP earlier this week.   Patient without insurance. Financial counselor consult done.   Pharmacy change to [facility] . [place] will call patient at discharge with cost of medications. If patient cannot afford will see if she is eligible for MATCH . Can only use MATCH once a year   Expected Discharge Plan: Home/Self Care Barriers to Discharge: Continued Medical Work up   Patient Goals and CMS Choice Patient states their goals for this hospitalization and ongoing recovery are:: to return to home      Expected Discharge Plan and Services Expected Discharge Plan: Home/Self Care In-house Referral: Financial Counselor Discharge Planning Services: Medication Assistance   Living arrangements for the past 2 months: Single Family Home                 DME Arranged: N/A         HH Arranged: NA          Prior Living Arrangements/Services Living arrangements for the past 2 months: Single Family Home Lives with:: Significant Other Patient language and need for interpreter reviewed:: Yes Do you feel safe going back to the place where you live?: Yes      Need for Family Participation in Patient Care: Yes (Comment) Care giver support system in place?: Yes (comment)   Criminal Activity/Legal Involvement Pertinent to Current Situation/Hospitalization: No - Comment as needed  Activities of Daily Living      Permission Sought/Granted   Permission granted to share information with : No              Emotional Assessment Appearance:: Appears stated age Attitude/Demeanor/Rapport:  Engaged Affect (typically observed): Accepting Orientation: : Oriented to Self, Oriented to Place, Oriented to  Time, Oriented to Situation Alcohol / Substance Use: Alcohol Use Psych Involvement: No (comment)  Admission diagnosis:  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] Pancreatic cancer ([place]) [C25.9] Alcohol-induced acute pancreatitis, unspecified complication status [X54.00] Acute pancreatitis [K85.90] Patient Active Problem List   Diagnosis Date Noted   Abnormal CT of the abdomen    Pain of upper abdomen    Pancreatic cancer ([place]) 07/24/2021   Acute pancreatitis 07/24/2021   Alcohol-induced pancreatitis 86/76[birth year]1950   Alcoholic ketoacidosis 93/26/7124   Epigastric pain 10/04/2017   MDD (major depressive disorder), recurrent severe, 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   Alcohol withdrawal ([place]) 04/25/2016   Tachycardia 04/25/2016   Transaminitis 04/25/2016   Leukocytosis 04/25/2016   Closed nondisplaced fracture of fifth right metatarsal bone 04/08/2016   Acute pain of right shoulder 04/08/2016   Vitamin D deficiency 12/11/2015   Alcohol dependence with unspecified alcohol-induced disorder ([place]) 12/09/2015   Fatigue 12/09/2015   Encounter for alcohol abuse counseling and surveillance 12/09/2015   Cough 01/04/2015   Tobacco abuse 01/04/2015   Depression with anxiety 01/04/2015   PCP:  Fredirick Lathe, PA-C Pharmacy:   [id number] [place] [place] [address]Phone: [phone]: [facility] [address]  [address] [place] [address] Phone: [phone] Fax: [place] [facility] [address] [place] [address] Phone: [phone] Fax: [place] [address] [place] [address] Phone: [phone] Fax: [phone]     Social Determinants  of Health (SDOH) Interventions    Readmission Risk Interventions     No data to display

## 2021-07-26 ENCOUNTER — Inpatient Hospital Stay (HOSPITAL_COMMUNITY): Payer: Self-pay

## 2021-07-26 DIAGNOSIS — K852 Alcohol induced acute pancreatitis without necrosis or infection: Principal | ICD-10-CM

## 2021-07-26 DIAGNOSIS — R1013 Epigastric pain: Secondary | ICD-10-CM

## 2021-07-26 LAB — CBC WITH DIFFERENTIAL/PLATELET
Abs Immature Granulocytes: 0.11 10*3/uL — ABNORMAL HIGH (ref 0.00–0.07)
Basophils Absolute: 0 10*3/uL (ref 0.0–0.1)
Basophils Relative: 0 %
Eosinophils Absolute: 0 10*3/uL (ref 0.0–0.5)
Eosinophils Relative: 0 %
HCT: 42 % (ref 36.0–46.0)
Hemoglobin: 13.7 g/dL (ref 12.0–15.0)
Immature Granulocytes: 1 %
Lymphocytes Relative: 5 %
Lymphs Abs: 0.9 10*3/uL (ref 0.7–4.0)
MCH: 35.1 pg — ABNORMAL HIGH (ref 26.0–34.0)
MCHC: 32.6 g/dL (ref 30.0–36.0)
MCV: 107.7 fL — ABNORMAL HIGH (ref 80.0–100.0)
Monocytes Absolute: 0.5 10*3/uL (ref 0.1–1.0)
Monocytes Relative: 3 %
Neutro Abs: 15.9 10*3/uL — ABNORMAL HIGH (ref 1.7–7.7)
Neutrophils Relative %: 91 %
Platelets: 591 10*3/uL — ABNORMAL HIGH (ref 150–400)
RBC: 3.9 MIL/uL (ref 3.87–5.11)
RDW: 13.6 % (ref 11.5–15.5)
WBC: 17.5 10*3/uL — ABNORMAL HIGH (ref 4.0–10.5)
nRBC: 0 % (ref 0.0–0.2)

## 2021-07-26 LAB — COMPREHENSIVE METABOLIC PANEL
ALT: 16 U/L (ref 0–44)
AST: 25 U/L (ref 15–41)
Albumin: 2.8 g/dL — ABNORMAL LOW (ref 3.5–5.0)
Alkaline Phosphatase: 100 U/L (ref 38–126)
Anion gap: 10 (ref 5–15)
BUN: 6 mg/dL — ABNORMAL LOW (ref 8–23)
CO2: 30 mmol/L (ref 22–32)
Calcium: 8.9 mg/dL (ref 8.9–10.3)
Chloride: 98 mmol/L (ref 98–111)
Creatinine, Ser: 0.41 mg/dL — ABNORMAL LOW (ref 0.44–1.00)
GFR, Estimated: >60 mL/min (ref >60)
Glucose, Bld: 144 mg/dL — ABNORMAL HIGH (ref 70–99)
Potassium: 4.2 mmol/L (ref 3.5–5.1)
Sodium: 138 mmol/L (ref 135–145)
Total Bilirubin: 0.4 mg/dL (ref 0.3–1.2)
Total Protein: 5.4 g/dL — ABNORMAL LOW (ref 6.5–8.1)

## 2021-07-26 LAB — HEMOGLOBIN AND HEMATOCRIT, BLOOD
HCT: 37.1 % (ref 36.0–46.0)
Hemoglobin: 12.4 g/dL (ref 12.0–15.0)

## 2021-07-26 LAB — LIPASE, BLOOD: Lipase: 129 U/L — ABNORMAL HIGH (ref 11–51)

## 2021-07-26 LAB — MAGNESIUM: Magnesium: 2 mg/dL (ref 1.7–2.4)

## 2021-07-26 MED ORDER — NICOTINE POLACRILEX 2 MG MT GUM
2.0000 mg | CHEWING_GUM | OROMUCOSAL | Status: DC | PRN
Start: 2021-07-26 — End: 2021-07-27
  Filled 2021-07-26: qty 1

## 2021-07-26 MED ORDER — HYDROMORPHONE HCL 1 MG/ML IJ SOLN
1.0000 mg | INTRAMUSCULAR | Status: DC | PRN
  Administered 2021-07-26 – 2021-07-27 (×2): 1 mg via INTRAVENOUS
  Filled 2021-07-26 (×2): qty 1

## 2021-07-26 MED ORDER — LACTATED RINGERS IV SOLN: INTRAVENOUS | Status: DC

## 2021-07-26 MED ORDER — OXYCODONE HCL 5 MG PO TABS
5.0000 mg | ORAL_TABLET | ORAL | Status: DC | PRN
  Administered 2021-07-26 (×2): 5 mg via ORAL
  Filled 2021-07-26 (×2): qty 1

## 2021-07-26 NOTE — Progress Notes (Addendum)
Daily Progress Note  Hospital Day: 3  Chief Complaint: pancreatitis   Brief History Kathryn Underwood is a 61 y.o. female with a pmh not limited to anxiety, depression, Etoh abuse, pancreatitis    Assessment   # 61 yo female with acute pancreatitis, likely related to Etoh. Associated large fluid collection causing gastric compression, possibly an evolving pseudocyst. Also with concern for pancreatic neoplasm on imaging but this can be difficult to evaluate in setting of acute pancreatitis.   She woke up from a nap with knife like pain in periumbilical area. Some mild nausea. She is vague about description as to whether this pain is same as what she had prior to admission.   WBC 17.5, due in part to solumedrol? Afebrile. Getting more Dilaudid ( she hadn't been needing much pain meds when I saw her yesterday morning). Abdomen a little distended with tympany, many have early ileus May need repeat imaging if pain contines  # Intermittent dark stools / FOBT + . No evidence for significant GI bleeding. Could have esophagitis, erosive gastritis or PUD. Interval history: Hgb is stable at 13.7.      # Hypoxia ( present on admission) with concern for COPD. Chest CT scan yesterday minimal lingular atelectasis/airspace disease and a 10 mm left ground-glass pulmonary nodule within the left upper lobe Getting Azithromycin and Rocephin   # Sinus tachycardia.    Plan   Will hold off on advancing diet as she is having recurrent abdominal pain right now.  Will have our office call her with a follow up appt with me. I will    Subjective   Work up from a nap and woke up with recurrent abdomina pain Pain is sharp. Had a BM this am ( no blood)  Objective   Imaging:  CT CHEST W CONTRAST  Result Date: 07/24/2021 CLINICAL DATA:  COPD exacerbation. EXAM: CT CHEST WITH CONTRAST TECHNIQUE: Multidetector CT imaging of the chest was performed during intravenous contrast administration. RADIATION  DOSE REDUCTION: This exam was performed according to the departmental dose-optimization program which includes automated exposure control, adjustment of the mA and/or kV according to patient size and/or use of iterative reconstruction technique. CONTRAST:  84m OMNIPAQUE IOHEXOL 350 MG/ML SOLN COMPARISON:  CT abdomen and pelvis 07/24/2021 FINDINGS: Cardiovascular: No significant vascular findings. Normal heart size. No pericardial effusion. Coronary artery calcifications are present. Mediastinum/Nodes: No enlarged mediastinal, hilar, or axillary lymph nodes. Thyroid gland, trachea, and esophagus demonstrate no significant findings. Lungs/Pleura: There is a small amount of patchy atelectasis/airspace disease in the lingula. There are atelectatic changes in both lower lobes. There is no pleural effusion or pneumothorax. Ground-glass nodular density is seen in the left lung apex measuring 1 cm. Trachea and central airways are within normal limits. Upper Abdomen: Cystic lesion in the left upper quadrant is partially imaged. Please see CT abdomen and pelvis performed same day for further description. Musculoskeletal: No chest wall abnormality. No acute or significant osseous findings. IMPRESSION: 1. Minimal lingular atelectasis/airspace disease correlate clinically for infection. 2. 10 mm left ground-glass pulmonary nodule within the upper lobe. Recommend a non-contrast Chest CT at 6-12 months to confirm persistence, then additional non-contrast Chest CTs every 2 years until 5 years. If nodule grows or develops solid component(s), consider resection. These guidelines do not apply to immunocompromised patients and patients with cancer. Follow up in patients with significant comorbidities as clinically warranted. For lung cancer screening, adhere to Lung-RADS guidelines. Reference: Radiology. 2017; 284(1):228-43. Electronically Signed  By: Ronney Asters M.D.   On: 07/24/2021 23:31   CT ABDOMEN PELVIS W  CONTRAST  Result Date: 07/24/2021 CLINICAL DATA:  Abdominal pain, acute.  Black diarrhea. EXAM: CT ABDOMEN AND PELVIS WITH CONTRAST TECHNIQUE: Multidetector CT imaging of the abdomen and pelvis was performed using the standard protocol following bolus administration of intravenous contrast. RADIATION DOSE REDUCTION: This exam was performed according to the departmental dose-optimization program which includes automated exposure control, adjustment of the mA and/or kV according to patient size and/or use of iterative reconstruction technique. CONTRAST:  3m OMNIPAQUE IOHEXOL 300 MG/ML  SOLN COMPARISON:  None Available. FINDINGS: Lower chest: Lung bases are clear. Hepatobiliary: Decreased attenuation of the liver is compatible with steatosis. Normal appearance of the gallbladder. There is a poorly defined low-density structure along the inferior right hepatic lobe adjacent to the gallbladder. This area measures approximately 2.2 cm. Additional small hypodensities throughout the liver. Some of these hypodensities could represent cysts but too small to definitively characterize. Trace perihepatic ascites. Main portal venous system is patent. No biliary dilatation. Normal appearance of the gallbladder. Pancreas: Poorly defined hypodensity in the pancreatic neck region, best seen on sequence 2 image 30. The pancreatic duct is dilated distal to this area of decreased attenuation in the pancreas. Normal appearance of the pancreatic head and uncinate process. Stranding anterior to the pancreatic body and there is fluid collection with peripheral enhancement around the distal pancreatic body region that measures 2.7 cm in with sequence 2 image 31. Spleen: Normal in size without focal abnormality. Adrenals/Urinary Tract: Normal appearance of the adrenal glands. Normal appearance of the urinary bladder. Normal appearance of both kidneys without stones, hydronephrosis or suspicious renal lesions. Stomach/Bowel: Massive  low-density cystic lesion along the posterior aspect of the stomach which is markedly compressing the stomach lumen. This collection measures 23.9 x 9.1 x 10.5 cm. Normal appearance of the duodenum. There is no significant dilatation of the distal esophagus. No small bowel or colonic dilatation. Vascular/Lymphatic: Atherosclerosis in the abdominal aorta without aneurysm or dissection. No significant aortic stenosis. No significant lymph node enlargement in the abdomen or pelvis. Reproductive: Calcified uterine fibroids. No evidence for an adnexal lesion. Other: Small amount of free fluid in the right hemipelvis near the right adnexa. Mild mesenteric edema and indeterminate low-density material along the left side of the abdomen on sequence 2 image 35 and just anterior to stomach and image number 35. These areas of low-density could represent trace fluid but difficult to exclude subtle peritoneal neoplastic disease. Musculoskeletal: No suspicious osseous findings. IMPRESSION: 1. Focal abnormality in the pancreatic body/neck region with dilatation of the pancreatic duct distal to this area. Findings raise concern for an underlying neoplastic lesion although focal inflammation from pancreatitis is also in the differential diagnosis. There are changes compatible with pancreatitis including stranding anterior to the pancreas with a fluid collection around the pancreatic body and a massive fluid collection involving the stomach which is causing severe compression of the stomach lumen. These findings could be secondary to pancreatitis with a massive pseudocyst formation but need to exclude a neoplastic process in the pancreatic neck region. Recommend GI consultation. 2. Small amount of fluid and stranding in the abdomen and pelvis which could be associated with pancreatitis. However, some of these areas of peritoneal low density are indeterminate and peritoneal neoplastic disease can not be excluded depending on the  etiology of the pancreatic lesion. 3. Hepatic steatosis with scattered hypodensities in the liver. Some of these hypodensities could represent cysts  or even areas of focal fat. Subtle hepatic lesions cannot be excluded and could be better characterized with a liver MRI. 4. Uterine fibroids. These results were called by telephone at the time of interpretation on 07/24/2021 at 10:09 am to provider ADAM CURATOLO , who verbally acknowledged these results. Electronically Signed   By: Markus Daft M.D.   On: 07/24/2021 10:13   DG Chest Portable 1 View  Result Date: 07/24/2021 CLINICAL DATA:  Cough EXAM: PORTABLE CHEST 1 VIEW COMPARISON:  Chest radiograph dated October 04, 2017 FINDINGS: The heart size and mediastinal contours are within normal limits. Both lungs are clear without evidence of focal consolidation or pleural effusion. The visualized skeletal structures are unremarkable. IMPRESSION: No active disease. Electronically Signed   By: Keane Police D.O.   On: 07/24/2021 08:49    Lab Results: Recent Labs    07/24/21 1703 07/25/21 0027 07/25/21 0903 07/25/21 1549 07/26/21 0020 07/26/21 0619  WBC 13.9* 11.0*  --   --   --  17.5*  HGB 15.1* 13.6   < > 13.4 12.4 13.7  HCT 45.0 40.8   < > 39.7 37.1 42.0  PLT 608* 541*  --   --   --  591*   < > = values in this interval not displayed.   BMET Recent Labs    07/24/21 1703 07/25/21 0027 07/26/21 0619  NA 138 137 138  K 3.8 3.9 4.2  CL 100 102 98  CO2 '28 29 30  '$ GLUCOSE 111* 93 144*  BUN 5* <5* 6*  CREATININE 0.54 0.51 0.41*  CALCIUM 8.6* 8.4* 8.9   LFT Recent Labs    07/26/21 0619  PROT 5.4*  ALBUMIN 2.8*  AST 25  ALT 16  ALKPHOS 100  BILITOT 0.4   PT/INR Recent Labs    07/25/21 0027  LABPROT 13.9  INR 1.1     Scheduled inpatient medications:   folic acid  1 mg Intravenous Daily   LORazepam  0-4 mg Intravenous Q12H   Or   LORazepam  0-4 mg Oral Q12H   multivitamin with minerals  1 tablet Oral Daily   nicotine  21 mg  Transdermal Daily   pantoprazole (PROTONIX) IV  40 mg Intravenous Q12H   thiamine  100 mg Intravenous Daily   Continuous inpatient infusions:   azithromycin 500 mg (07/26/21 0007)   cefTRIAXone (ROCEPHIN)  IV 1 g (07/25/21 2226)   lactated ringers 75 mL/hr at 07/26/21 0923   PRN inpatient medications: acetaminophen **OR** acetaminophen, albuterol, HYDROmorphone (DILAUDID) injection, nicotine polacrilex, ondansetron **OR** ondansetron (ZOFRAN) IV, oxyCODONE  Vital signs in last 24 hours: Temp:  [97.4 F (36.3 C)-98.4 F (36.9 C)] 97.8 F (36.6 C) (07/01 0730) Pulse Rate:  [93-109] 93 (07/01 1100) Resp:  [16-18] 16 (07/01 0730) BP: (105-117)/(60-81) 107/76 (07/01 1100) SpO2:  [95 %-96 %] 95 % (07/01 0730) Weight:  [70.4 kg] 70.4 kg (07/01 0411) Last BM Date : 07/25/21  Intake/Output Summary (Last 24 hours) at 07/26/2021 1356 Last data filed at 07/25/2021 2230 Gross per 24 hour  Intake 240 ml  Output --  Net 240 ml     Physical Exam:  General: Alert female in restless in bed. Verbalizing that she is having abdominal pain  Heart:  sinus tachycardic.  Pulmonary: Normal respiratory effort Abdomen: Soft, mildly distended, moderate ipgastric tenderness. A few bowel sounds.  Neurologic: Alert and oriented Psych: Pleasant. Cooperative.    Intake/Output from previous day: 06/30 0701 - 07/01 0700 In: 240 [P.O.:240]  Out: -  Intake/Output this shift: No intake/output data recorded.    Active Problems:   Depression with anxiety   Alcohol dependence with unspecified alcohol-induced disorder (HCC)   Alcohol-induced pancreatitis   Abnormal CT of the abdomen   Pulmonary nodule   Pancreatic pseudocyst/cyst     LOS: 2 days   Tye Savoy ,NP 07/26/2021, 1:56 PM  GI ATTENDING  Interval history data reviewed.  Patient seen and examined.  Agree with interval progress note as outlined above.  Agree with backing off diet a bit.  Continue with maximal supportive care.  We will  reassess tomorrow.  Docia Chuck. Geri Seminole., M.D. [facility] Division of Gastroenterology

## 2021-07-26 NOTE — Progress Notes (Signed)
  Progress Note Patient: Kathryn Underwood UMP:[id number] DOB: [birth year] DOA: 07/24/2021  DOS: the patient was seen and examined on 07/26/2021  Brief hospital course: 61 year old female with past medical history of anxiety, depression, GERD, alcohol use drinks 1 wine bottle per day presented with abdominal pain ongoing for 2 weeks. Found to have acute pancreatitis as well as pancreatic cyst. GI consulted.conservative measures initiated. Currently diet advanced to clear liquid diet but appears to have more pain on 7/1. Assessment and Plan: Acute alcoholic pancreatitis with pancreatic pseudocyst. GI consulted. Patient drinks 1 wine bottle a day. CT scan shows evidence of pancreatitis as well as possible pancreatic lesion. Also evidence of large pseudocyst with compression of the stomach. Currently plan is conservative measures. Currently on clear liquid diet. Pain appears to be more on 7/1.  Increasing regimen to 1 mg Dilaudid every 3 hours as needed from 0.5 mg Dilaudid every 3 hours as needed. May require PCA pump if the pain does not improve. We will get x-ray abdomen to rule out ileus. Continue IV fluids, IV nausea medication.  Monitor.   Alcohol abuse. Drinks 1 bottle of wine a day. Last drink was 1 day before admission. Currently on CIWA protocol. Monitor.   Concern for malignant pancreatic lesion. Outpatient follow-up with GI.   COPD based on the CT scan. I do not think the patient actually has acute exacerbation of COPD and therefore I do not think the patient requires IV antibiotics or IV steroids to treat the condition. Patient has incidental pulmonary nodule.  Will require follow-up imaging. Continue inhalers.   Anxiety. Continue home regimen.   GERD. Continue PPI.  Subjective: No nausea no vomiting but reports worsening abdominal pain which is at the lowest level 5 out of 10 in at the highest level 10 out of 10 despite getting 0.5 mg of Dilaudid.  Passing gas.  Abdomen  appears to be more distended.  No shortness of breath no chest pain.  Physical Exam: Vitals:   07/26/21 0411 07/26/21 0730 07/26/21 1100 07/26/21 1529  BP:  107/75 107/76 100/64  Pulse:  (!) 105 93 93  Resp:  16  16  Temp:  97.8 F (36.6 C)  98.2 F (36.8 C)  TempSrc:  Oral  Oral  SpO2:  95%  100%  Weight: 70.4 kg      General: Appear in moderate distress; no visible Abnormal Neck Mass Or lumps, Conjunctiva normal Cardiovascular: S1 and S2 Present, no Murmur, Respiratory: good respiratory effort, Bilateral Air entry present and CTA, no Crackles, no wheezes Abdomen: Bowel Sound present, diffusely tender  Extremities: no Pedal edema Neurology: alert and oriented to time, place, and person  Gait not checked due to patient safety concerns   Data Reviewed: I have Reviewed nursing notes, Vitals, and Lab results since pt's last encounter. Pertinent lab results CBC and BMP I have ordered test including CBC and BMP I have ordered imaging studies x-ray abdomen. I have discussed pt's care plan and test results with GI.   Family Communication: None at bedside  Disposition: Status is: Inpatient Remains inpatient appropriate because: Need for IV pain medication, currently having more pain in the setting of pancreatitis.  Author: Berle Mull, MD 07/26/2021 5:39 PM  Please look on [email or web address] to find out who is on call.

## 2021-07-26 NOTE — Plan of Care (Signed)
  Problem: Nutrition: Goal: Adequate nutrition will be maintained Outcome: Progressing   Problem: Pain Managment: Goal: General experience of comfort will improve Outcome: Progressing   Problem: Safety: Goal: Ability to remain free from injury will improve Outcome: Progressing   

## 2021-07-27 ENCOUNTER — Inpatient Hospital Stay (HOSPITAL_COMMUNITY): Payer: Self-pay

## 2021-07-27 DIAGNOSIS — J449 Chronic obstructive pulmonary disease, unspecified: Secondary | ICD-10-CM | POA: Diagnosis present

## 2021-07-27 DIAGNOSIS — F1029 Alcohol dependence with unspecified alcohol-induced disorder: Secondary | ICD-10-CM

## 2021-07-27 DIAGNOSIS — R140 Abdominal distension (gaseous): Secondary | ICD-10-CM

## 2021-07-27 DIAGNOSIS — K862 Cyst of pancreas: Secondary | ICD-10-CM

## 2021-07-27 DIAGNOSIS — G4700 Insomnia, unspecified: Secondary | ICD-10-CM | POA: Diagnosis present

## 2021-07-27 DIAGNOSIS — K863 Pseudocyst of pancreas: Secondary | ICD-10-CM

## 2021-07-27 DIAGNOSIS — I748 Embolism and thrombosis of other arteries: Secondary | ICD-10-CM | POA: Diagnosis present

## 2021-07-27 DIAGNOSIS — K8689 Other specified diseases of pancreas: Secondary | ICD-10-CM | POA: Diagnosis present

## 2021-07-27 DIAGNOSIS — F10931 Alcohol use, unspecified with withdrawal delirium: Secondary | ICD-10-CM | POA: Diagnosis present

## 2021-07-27 DIAGNOSIS — D259 Leiomyoma of uterus, unspecified: Secondary | ICD-10-CM | POA: Diagnosis present

## 2021-07-27 DIAGNOSIS — R4182 Altered mental status, unspecified: Secondary | ICD-10-CM

## 2021-07-27 LAB — PROCALCITONIN: Procalcitonin: <0.1 ng/mL

## 2021-07-27 LAB — CBC WITH DIFFERENTIAL/PLATELET
Abs Immature Granulocytes: 0.07 10*3/uL (ref 0.00–0.07)
Basophils Absolute: 0 10*3/uL (ref 0.0–0.1)
Basophils Relative: 0 %
Eosinophils Absolute: 0.1 10*3/uL (ref 0.0–0.5)
Eosinophils Relative: 1 %
HCT: 39.1 % (ref 36.0–46.0)
Hemoglobin: 12.5 g/dL (ref 12.0–15.0)
Immature Granulocytes: 1 %
Lymphocytes Relative: 13 %
Lymphs Abs: 1.7 10*3/uL (ref 0.7–4.0)
MCH: 34.6 pg — ABNORMAL HIGH (ref 26.0–34.0)
MCHC: 32 g/dL (ref 30.0–36.0)
MCV: 108.3 fL — ABNORMAL HIGH (ref 80.0–100.0)
Monocytes Absolute: 1.2 10*3/uL — ABNORMAL HIGH (ref 0.1–1.0)
Monocytes Relative: 9 %
Neutro Abs: 10.1 10*3/uL — ABNORMAL HIGH (ref 1.7–7.7)
Neutrophils Relative %: 76 %
Platelets: 569 10*3/uL — ABNORMAL HIGH (ref 150–400)
RBC: 3.61 MIL/uL — ABNORMAL LOW (ref 3.87–5.11)
RDW: 13.4 % (ref 11.5–15.5)
WBC: 13.2 10*3/uL — ABNORMAL HIGH (ref 4.0–10.5)
nRBC: 0 % (ref 0.0–0.2)

## 2021-07-27 LAB — COMPREHENSIVE METABOLIC PANEL
ALT: 14 U/L (ref 0–44)
AST: 29 U/L (ref 15–41)
Albumin: 2.5 g/dL — ABNORMAL LOW (ref 3.5–5.0)
Alkaline Phosphatase: 92 U/L (ref 38–126)
Anion gap: 9 (ref 5–15)
BUN: 6 mg/dL — ABNORMAL LOW (ref 8–23)
CO2: 31 mmol/L (ref 22–32)
Calcium: 8.7 mg/dL — ABNORMAL LOW (ref 8.9–10.3)
Chloride: 96 mmol/L — ABNORMAL LOW (ref 98–111)
Creatinine, Ser: 0.41 mg/dL — ABNORMAL LOW (ref 0.44–1.00)
GFR, Estimated: >60 mL/min (ref >60)
Glucose, Bld: 105 mg/dL — ABNORMAL HIGH (ref 70–99)
Potassium: 3.6 mmol/L (ref 3.5–5.1)
Sodium: 136 mmol/L (ref 135–145)
Total Bilirubin: 0.2 mg/dL — ABNORMAL LOW (ref 0.3–1.2)
Total Protein: 4.8 g/dL — ABNORMAL LOW (ref 6.5–8.1)

## 2021-07-27 LAB — GLUCOSE, CAPILLARY
Glucose-Capillary: 90 mg/dL (ref 70–99)
Glucose-Capillary: 101 mg/dL — ABNORMAL HIGH (ref 70–99)

## 2021-07-27 LAB — MAGNESIUM: Magnesium: 2.1 mg/dL (ref 1.7–2.4)

## 2021-07-27 LAB — LACTIC ACID, PLASMA: Lactic Acid, Venous: 0.7 mmol/L (ref 0.5–1.9)

## 2021-07-27 MED ORDER — LORAZEPAM 2 MG/ML IJ SOLN
0.0000 mg | Freq: Four times a day (QID) | INTRAMUSCULAR | Status: DC
  Administered 2021-07-27: 4 mg via INTRAVENOUS
  Filled 2021-07-27: qty 2

## 2021-07-27 MED ORDER — IPRATROPIUM-ALBUTEROL 0.5-2.5 (3) MG/3ML IN SOLN
3.0000 mL | Freq: Four times a day (QID) | RESPIRATORY_TRACT | Status: DC
Start: 2021-07-27 — End: 2021-07-27
  Administered 2021-07-27: 3 mL via RESPIRATORY_TRACT
  Filled 2021-07-27: qty 3

## 2021-07-27 MED ORDER — LORAZEPAM 1 MG PO TABS
1.0000 mg | ORAL_TABLET | ORAL | Status: AC | PRN
  Administered 2021-07-29: 1 mg via ORAL
  Filled 2021-07-27: qty 1

## 2021-07-27 MED ORDER — LORAZEPAM 2 MG/ML IJ SOLN: 0.0000 mg | Freq: Three times a day (TID) | INTRAMUSCULAR | Status: DC

## 2021-07-27 MED ORDER — HYDROMORPHONE HCL 1 MG/ML IJ SOLN
0.5000 mg | INTRAMUSCULAR | Status: DC | PRN
  Administered 2021-07-27 – 2021-07-28 (×5): 1 mg via INTRAVENOUS
  Filled 2021-07-27 (×5): qty 1

## 2021-07-27 MED ORDER — IOHEXOL 300 MG/ML  SOLN
75.0000 mL | Freq: Once | INTRAMUSCULAR | Status: AC | PRN
  Administered 2021-07-27: 75 mL via INTRAVENOUS

## 2021-07-27 MED ORDER — LORAZEPAM 2 MG/ML IJ SOLN
1.0000 mg | INTRAMUSCULAR | Status: AC | PRN
  Administered 2021-07-27: 1 mg via INTRAVENOUS
  Administered 2021-07-27: 4 mg via INTRAVENOUS
  Administered 2021-07-29: 2 mg via INTRAVENOUS
  Administered 2021-07-29 (×3): 1 mg via INTRAVENOUS
  Administered 2021-07-30: 4 mg via INTRAVENOUS
  Filled 2021-07-27: qty 1
  Filled 2021-07-27: qty 2
  Filled 2021-07-27 (×5): qty 1
  Filled 2021-07-27 (×2): qty 2
  Filled 2021-07-27: qty 1
  Filled 2021-07-27: qty 2

## 2021-07-27 MED ORDER — LORAZEPAM 1 MG PO TABS: 0.0000 mg | ORAL_TABLET | Freq: Four times a day (QID) | ORAL | Status: DC

## 2021-07-27 MED ORDER — LORAZEPAM 2 MG/ML IJ SOLN
0.0000 mg | INTRAMUSCULAR | Status: DC
  Administered 2021-07-27: 4 mg via INTRAVENOUS
  Administered 2021-07-27 – 2021-07-28 (×4): 2 mg via INTRAVENOUS
  Administered 2021-07-28 (×2): 1 mg via INTRAVENOUS
  Filled 2021-07-27 (×6): qty 1
  Filled 2021-07-27: qty 2
  Filled 2021-07-27: qty 1

## 2021-07-27 NOTE — Progress Notes (Signed)
Report given to [facility] nurse, pt is being transported Spoke with pts significant other [facility] about transfer to [facility].
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
## 2021-07-27 NOTE — Progress Notes (Addendum)
Daily Progress Note  Hospital Day: 4  Chief Complaint:  pancreatitis  Brief History Kathryn PIGGOTT is a 61 y.o. female with a pmh not limited to anxiety, depression, Etoh abuse, pancreatitis      Assessment   61 yo female with acute pancreatitis, likely related to Etoh. Associated large fluid collection causing gastric compression, possibly an evolving pseudocyst. Also a focal abnormality of pancreatic body / neck region with dilation of PD distal to area raising concern for neoplasm Interval history: She started to have recurrent abdominal pain when I saw her yesterday. She is asleep right now but nursing tells me she has continued to complain of abdominal pain. Seems she required a fair amount of Ativan during the night and is currently sleeping. My abdominal exam didn't awaken her which is a good sign but her abdomen is more distended compared to yesterday. Bowel sounds hypoactive. Not sure if constipated, is developing an ileus or worsening pancreatitis?  I am also concerned about worsening tachycardia ( RN has placed a call to [facility]). BP soft but has gotten pain meds and Ativan). Depending on clinical course we may need to reimage abdomen today. No N/V. WBC improved from 17.5 to 13 after stopping Solumedrol  # Intermittent dark stools / FOBT + . No evidence for significant GI bleeding. Could have esophagitis, erosive gastritis or PUD. Hgb is stable overall, fluctuating between mid 12 and mid 13.   # Sinus tachycardia, worsening ( even while asleep). BP has been soft but received Ativan   Plan   Continue IVF Continue supportive care   Subjective   Asleep. RN tells me she had been complaining of abdominal pain. Got 4 mg of ativan during the night  Objective   Imaging:  DG Abd Portable 1V  Result Date: 07/26/2021 CLINICAL DATA:  Abdominal distension. EXAM: PORTABLE ABDOMEN - 1 VIEW COMPARISON:  10/06/2017.  CT, 07/24/2021. FINDINGS: There is no bowel dilation to suggest  obstruction. Soft tissues are poorly defined. No evidence of renal or ureteral stones. IMPRESSION: No acute findings or evidence of bowel obstruction. Electronically Signed   By: Lajean Manes M.D.   On: 07/26/2021 18:07   CT CHEST W CONTRAST  Result Date: 07/24/2021 CLINICAL DATA:  COPD exacerbation. EXAM: CT CHEST WITH CONTRAST TECHNIQUE: Multidetector CT imaging of the chest was performed during intravenous contrast administration. RADIATION DOSE REDUCTION: This exam was performed according to the departmental dose-optimization program which includes automated exposure control, adjustment of the mA and/or kV according to patient size and/or use of iterative reconstruction technique. CONTRAST:  47m OMNIPAQUE IOHEXOL 350 MG/ML SOLN COMPARISON:  CT abdomen and pelvis 07/24/2021 FINDINGS: Cardiovascular: No significant vascular findings. Normal heart size. No pericardial effusion. Coronary artery calcifications are present. Mediastinum/Nodes: No enlarged mediastinal, hilar, or axillary lymph nodes. Thyroid gland, trachea, and esophagus demonstrate no significant findings. Lungs/Pleura: There is a small amount of patchy atelectasis/airspace disease in the lingula. There are atelectatic changes in both lower lobes. There is no pleural effusion or pneumothorax. Ground-glass nodular density is seen in the left lung apex measuring 1 cm. Trachea and central airways are within normal limits. Upper Abdomen: Cystic lesion in the left upper quadrant is partially imaged. Please see CT abdomen and pelvis performed same day for further description. Musculoskeletal: No chest wall abnormality. No acute or significant osseous findings. IMPRESSION: 1. Minimal lingular atelectasis/airspace disease correlate clinically for infection. 2. 10 mm left ground-glass pulmonary nodule within the upper lobe. Recommend a non-contrast Chest  CT at 6-12 months to confirm persistence, then additional non-contrast Chest CTs every 2 years until 5  years. If nodule grows or develops solid component(s), consider resection. These guidelines do not apply to immunocompromised patients and patients with cancer. Follow up in patients with significant comorbidities as clinically warranted. For lung cancer screening, adhere to Lung-RADS guidelines. Reference: Radiology. 2017; 284(1):228-43. Electronically Signed   By: Ronney Asters M.D.   On: 07/24/2021 23:31   CT ABDOMEN PELVIS W CONTRAST  Result Date: 07/24/2021 CLINICAL DATA:  Abdominal pain, acute.  Black diarrhea. EXAM: CT ABDOMEN AND PELVIS WITH CONTRAST TECHNIQUE: Multidetector CT imaging of the abdomen and pelvis was performed using the standard protocol following bolus administration of intravenous contrast. RADIATION DOSE REDUCTION: This exam was performed according to the departmental dose-optimization program which includes automated exposure control, adjustment of the mA and/or kV according to patient size and/or use of iterative reconstruction technique. CONTRAST:  83m OMNIPAQUE IOHEXOL 300 MG/ML  SOLN COMPARISON:  None Available. FINDINGS: Lower chest: Lung bases are clear. Hepatobiliary: Decreased attenuation of the liver is compatible with steatosis. Normal appearance of the gallbladder. There is a poorly defined low-density structure along the inferior right hepatic lobe adjacent to the gallbladder. This area measures approximately 2.2 cm. Additional small hypodensities throughout the liver. Some of these hypodensities could represent cysts but too small to definitively characterize. Trace perihepatic ascites. Main portal venous system is patent. No biliary dilatation. Normal appearance of the gallbladder. Pancreas: Poorly defined hypodensity in the pancreatic neck region, best seen on sequence 2 image 30. The pancreatic duct is dilated distal to this area of decreased attenuation in the pancreas. Normal appearance of the pancreatic head and uncinate process. Stranding anterior to the pancreatic  body and there is fluid collection with peripheral enhancement around the distal pancreatic body region that measures 2.7 cm in with sequence 2 image 31. Spleen: Normal in size without focal abnormality. Adrenals/Urinary Tract: Normal appearance of the adrenal glands. Normal appearance of the urinary bladder. Normal appearance of both kidneys without stones, hydronephrosis or suspicious renal lesions. Stomach/Bowel: Massive low-density cystic lesion along the posterior aspect of the stomach which is markedly compressing the stomach lumen. This collection measures 23.9 x 9.1 x 10.5 cm. Normal appearance of the duodenum. There is no significant dilatation of the distal esophagus. No small bowel or colonic dilatation. Vascular/Lymphatic: Atherosclerosis in the abdominal aorta without aneurysm or dissection. No significant aortic stenosis. No significant lymph node enlargement in the abdomen or pelvis. Reproductive: Calcified uterine fibroids. No evidence for an adnexal lesion. Other: Small amount of free fluid in the right hemipelvis near the right adnexa. Mild mesenteric edema and indeterminate low-density material along the left side of the abdomen on sequence 2 image 35 and just anterior to stomach and image number 35. These areas of low-density could represent trace fluid but difficult to exclude subtle peritoneal neoplastic disease. Musculoskeletal: No suspicious osseous findings. IMPRESSION: 1. Focal abnormality in the pancreatic body/neck region with dilatation of the pancreatic duct distal to this area. Findings raise concern for an underlying neoplastic lesion although focal inflammation from pancreatitis is also in the differential diagnosis. There are changes compatible with pancreatitis including stranding anterior to the pancreas with a fluid collection around the pancreatic body and a massive fluid collection involving the stomach which is causing severe compression of the stomach lumen. These findings  could be secondary to pancreatitis with a massive pseudocyst formation but need to exclude a neoplastic process in the pancreatic neck  region. Recommend GI consultation. 2. Small amount of fluid and stranding in the abdomen and pelvis which could be associated with pancreatitis. However, some of these areas of peritoneal low density are indeterminate and peritoneal neoplastic disease can not be excluded depending on the etiology of the pancreatic lesion. 3. Hepatic steatosis with scattered hypodensities in the liver. Some of these hypodensities could represent cysts or even areas of focal fat. Subtle hepatic lesions cannot be excluded and could be better characterized with a liver MRI. 4. Uterine fibroids. These results were called by telephone at the time of interpretation on 07/24/2021 at 10:09 am to provider ADAM CURATOLO , who verbally acknowledged these results. Electronically Signed   By: Markus Daft M.D.   On: 07/24/2021 10:13   DG Chest Portable 1 View  Result Date: 07/24/2021 CLINICAL DATA:  Cough EXAM: PORTABLE CHEST 1 VIEW COMPARISON:  Chest radiograph dated October 04, 2017 FINDINGS: The heart size and mediastinal contours are within normal limits. Both lungs are clear without evidence of focal consolidation or pleural effusion. The visualized skeletal structures are unremarkable. IMPRESSION: No active disease. Electronically Signed   By: Keane Police D.O.   On: 07/24/2021 08:49    Lab Results: Recent Labs    07/25/21 0027 07/25/21 0903 07/26/21 0020 07/26/21 0619 07/27/21 0009  WBC 11.0*  --   --  17.5* 13.2*  HGB 13.6   < > 12.4 13.7 12.5  HCT 40.8   < > 37.1 42.0 39.1  PLT 541*  --   --  591* 569*   < > = values in this interval not displayed.   BMET Recent Labs    07/25/21 0027 07/26/21 0619 07/27/21 0009  NA 137 138 136  K 3.9 4.2 3.6  CL 102 98 96*  CO2 '29 30 31  '$ GLUCOSE 93 144* 105*  BUN <5* 6* 6*  CREATININE 0.51 0.41* 0.41*  CALCIUM 8.4* 8.9 8.7*   LFT Recent  Labs    07/27/21 0009  PROT 4.8*  ALBUMIN 2.5*  AST 29  ALT 14  ALKPHOS 92  BILITOT 0.2*   PT/INR Recent Labs    07/25/21 0027  LABPROT 13.9  INR 1.1     Scheduled inpatient medications:   folic acid  1 mg Intravenous Daily   ipratropium-albuterol  3 mL Nebulization QID   LORazepam  0-4 mg Intravenous Q4H   Followed by   Derrill Memo ON 07/29/2021] LORazepam  0-4 mg Intravenous Q8H   nicotine  21 mg Transdermal Daily   pantoprazole (PROTONIX) IV  40 mg Intravenous Q12H   thiamine  100 mg Intravenous Daily   Continuous inpatient infusions:   lactated ringers 100 mL/hr at 07/27/21 1006   PRN inpatient medications: acetaminophen **OR** acetaminophen, albuterol, HYDROmorphone (DILAUDID) injection, LORazepam **OR** LORazepam, ondansetron **OR** ondansetron (ZOFRAN) IV  Vital signs in last 24 hours: Temp:  [97.7 F (36.5 C)-98.2 F (36.8 C)] 98.2 F (36.8 C) (07/02 0737) Pulse Rate:  [92-149] 149 (07/02 1056) Resp:  [11-24] 11 (07/02 1056) BP: (89-123)/(63-86) 103/72 (07/02 1056) SpO2:  [93 %-100 %] 93 % (07/02 1056) Last BM Date : 07/25/21  Intake/Output Summary (Last 24 hours) at 07/27/2021 1131 Last data filed at 07/27/2021 0648 Gross per 24 hour  Intake 300 ml  Output --  Net 300 ml     Physical Exam:  General: sleeping.  Heart:  Sinus tachycardia in 140's.  No lower extremity edema Pulmonary: Normal respiratory effort Abdomen: Soft, distended, nontender, mild tympany. Hypoactive bowel  sounds.   Intake/Output from previous day: 07/01 0701 - 07/02 0700 In: 300 [IV Piggyback:300] Out: -  Intake/Output this shift: No intake/output data recorded.    Active Problems:   Depression with anxiety   Alcohol dependence with unspecified alcohol-induced disorder (HCC)   Alcohol-induced pancreatitis   Abnormal CT of the abdomen   Pulmonary nodule   Pancreatic pseudocyst/cyst     LOS: 3 days   Tye Savoy ,NP 07/27/2021, 11:31 AM  GI ATTENDING  Interval  history data reviewed.  Patient personally seen and examined.  Agree with interval progress note.  Patient is somnolent after Ativan.  Her abdomen slightly more distended and tender than yesterday.  Wondering if issues with vital sign changes might not be related to alcohol withdrawal.  She is on withdrawal protocol.  Other concerns might be changes with regards to her pancreatitis and large organizing fluid collection.  At this point, we will proceed with repeat CT scan.  She will not be able to take oral contrast.  IV contrast only study.  Docia Chuck. Geri Seminole., M.D. [facility] Division of Gastroenterology

## 2021-07-27 NOTE — Progress Notes (Signed)
Progress Note Patient: Kathryn Underwood IOX:[id number] DOB: 08/30/1960 DOA: 07/24/2021  DOS: the patient was seen and examined on 07/27/2021  Brief hospital course: 61 year old female with past medical history of anxiety, depression, GERD, alcohol use drinks 1 wine bottle per day presented with abdominal pain ongoing for 2 weeks. Found to have acute pancreatitis as well as pancreatic cyst. GI consulted.conservative measures initiated. 7/1 diet advanced to clear liquid diet, x-ray abdomen was unremarkable.  Transfer to progressive care unit for acute delirium. 7/2 remains lethargic and drowsy after receiving Ativan.  CT abdomen pelvis shows possibility of pancreatic lesion hemorrhage and worsening of the pseudocyst size as well as possibility of nonocclusive splenic vein thrombosis.  Discussed with GI recommended to continue current management, no indication for anticoagulation especially given risk.  Assessment and Plan: Alcohol induced acute pancreatitis without necrosis or infection Pancreatic pseudocyst Pancreas hemorrhage GI consulted. Patient drinks 1 wine bottle a day. CT scan shows evidence of pancreatitis as well as possible pancreatic lesion. Also evidence of large pseudocyst with compression of the stomach. Currently plan is conservative measures. Patient was not clear liquid diet but due to lethargy currently NPO. X-ray abdomen negative for any ileus. Due to ongoing abdominal pain a CT abdomen pelvis with contrast was repeated on 7/2 which shows pancreatic hemorrhage and the lesion along with worsening pseudocyst. Discussed with Dr. Henrene Pastor, recommend to continue current management for now. Continue IV fluids, IV nausea medication.  Monitor.  Alcohol dependence with unspecified alcohol-induced disorder (HCC) Delirium tremens ([place]) Drinks 1 bottle of wine a day. Last drink was 1 day before admission. Currently on CIWA protocol.  Transferred to progressive care unit on 7/2 early in  the morning due to severe delirium. Monitor.  Thrombosis of splenic artery (HCC) For now we will monitor.  No anticoagulation given concern with bleeding in the pancreatic lesion.  Sinus tachycardia Intermittent.  Expected secondary to pancreatitis.  Monitor for now.  Would not initiate chemotherapy for rate control unless indicated.Marland Kitchen  COPD (chronic obstructive pulmonary disease) There was some concern for COPD exacerbation, currently ruled out. Patient was started on IV steroids and IV antibiotics.  Procalcitonin level is negative.  Do not think that the patient has any infection.  Discontinue antibiotics discontinue steroid.  Left pulmonary nodule. 10 mm left ground-glass pulmonary nodule within the upper lobe. Recommend a non-contrast Chest CT at 6-12 months to confirm persistence, then additional non-contrast Chest CTs every 2 years until 5 years. If nodule grows or develops solid component(s), consider resection  Uterine fibroid Incidentally seen. Outpatient follow-up.  Depression with anxiety Insomnia On Eszopiclone at home.  Also on Seroquel along with Remeron, gabapentin.  Currently on hold. Currently on IV Ativan.  Subjective: Transfer to progressive care unit due to delirium.  Drowsy.  Unable to follow any commands.  Unable to engage in conversation.  Purposefully moving all extremities.  Physical Exam: Vitals:   07/27/21 1056 07/27/21 1201 07/27/21 1210 07/27/21 1500  BP: 103/72 92/72  97/68  Pulse: (!) 149 99  (!) 102  Resp: '11 14  20  '$ Temp:  (!) 97.4 F (36.3 C)  98.5 F (36.9 C)  TempSrc:  Axillary  Axillary  SpO2: 93% 98% 99% 98%  Weight:       General: Appear in mild distress; no visible Abnormal Neck Mass Or lumps, Conjunctiva normal Cardiovascular: S1 and S2 Present, no Murmur, Respiratory: increased respiratory effort, Bilateral Air entry present and faint Crackles, no wheezes Abdomen: Bowel Sound present, distended, tense Extremities:  no Pedal  edema Neurology: lethargic and not oriented to time, place, and person  Gait not checked due to patient safety concerns   Data Reviewed: I have Reviewed nursing notes, Vitals, and Lab results since pt's last encounter. Pertinent lab results CBC and CMP and procalcitonin and lactic acid level I have ordered test including CBC and BMP I have independently visualized and interpreted imaging x-ray abdomen which showed no evidence of obstruction. I have ordered imaging studies CT abdomen pelvis with contrast. I have discussed pt's care plan and test results with gastroenterology and radiology.   Family Communication: Discussed with significant other on the phone, he will inform other family members as well.  Disposition: Status is: Inpatient Remains inpatient appropriate because: Currently remains n.p.o. requiring IV therapy for both alcohol withdrawal as well as acute pancreatitis.  Author: Berle Mull, MD 07/27/2021 7:03 PM  Please look on [email or web address] to find out who is on call.

## 2021-07-27 NOTE — Progress Notes (Addendum)
Pt is agitated and confused on where she is CIWA score 26. MD ordered 4 mg of Ativan. Second dose of Ativan 4 mg given '@0322'$  CIWA score 24.

## 2021-07-27 NOTE — Progress Notes (Signed)
Patient was seen for hyperactive delirium concerning for alcohol withdrawal. Plan to move to progressive unit and increase frequency of CIWA scoring and Ativan.

## 2021-07-28 ENCOUNTER — Inpatient Hospital Stay (HOSPITAL_COMMUNITY): Payer: Self-pay

## 2021-07-28 LAB — CBC WITH DIFFERENTIAL/PLATELET
Abs Immature Granulocytes: 0.08 10*3/uL — ABNORMAL HIGH (ref 0.00–0.07)
Basophils Absolute: 0.1 10*3/uL (ref 0.0–0.1)
Basophils Relative: 0 %
Eosinophils Absolute: 0.2 10*3/uL (ref 0.0–0.5)
Eosinophils Relative: 1 %
HCT: 41.3 % (ref 36.0–46.0)
Hemoglobin: 12.5 g/dL (ref 12.0–15.0)
Immature Granulocytes: 1 %
Lymphocytes Relative: 7 %
Lymphs Abs: 0.9 10*3/uL (ref 0.7–4.0)
MCH: 34 pg (ref 26.0–34.0)
MCHC: 30.3 g/dL (ref 30.0–36.0)
MCV: 112.2 fL — ABNORMAL HIGH (ref 80.0–100.0)
Monocytes Absolute: 1.4 10*3/uL — ABNORMAL HIGH (ref 0.1–1.0)
Monocytes Relative: 10 %
Neutro Abs: 10.9 10*3/uL — ABNORMAL HIGH (ref 1.7–7.7)
Neutrophils Relative %: 81 %
Platelets: 511 10*3/uL — ABNORMAL HIGH (ref 150–400)
RBC: 3.68 MIL/uL — ABNORMAL LOW (ref 3.87–5.11)
RDW: 13.5 % (ref 11.5–15.5)
Smear Review: NORMAL
WBC: 13.5 10*3/uL — ABNORMAL HIGH (ref 4.0–10.5)
nRBC: 0 % (ref 0.0–0.2)

## 2021-07-28 LAB — BLOOD GAS, ARTERIAL
Acid-Base Excess: 8 mmol/L — ABNORMAL HIGH (ref 0.0–2.0)
Bicarbonate: 39 mmol/L — ABNORMAL HIGH (ref 20.0–28.0)
Drawn by: 39899
O2 Saturation: 99.4 %
Patient temperature: 37.1
pCO2 arterial: 91 mmHg (ref 32–48)
pH, Arterial: 7.24 — ABNORMAL LOW (ref 7.35–7.45)
pO2, Arterial: 193 mmHg — ABNORMAL HIGH (ref 83–108)

## 2021-07-28 LAB — GLUCOSE, CAPILLARY
Glucose-Capillary: 101 mg/dL — ABNORMAL HIGH (ref 70–99)
Glucose-Capillary: 93 mg/dL (ref 70–99)
Glucose-Capillary: 103 mg/dL — ABNORMAL HIGH (ref 70–99)
Glucose-Capillary: 77 mg/dL (ref 70–99)

## 2021-07-28 LAB — MRSA NEXT GEN BY PCR, NASAL: MRSA by PCR Next Gen: NOT DETECTED

## 2021-07-28 LAB — COMPREHENSIVE METABOLIC PANEL
ALT: 12 U/L (ref 0–44)
AST: 22 U/L (ref 15–41)
Albumin: 2.4 g/dL — ABNORMAL LOW (ref 3.5–5.0)
Alkaline Phosphatase: 92 U/L (ref 38–126)
Anion gap: 7 (ref 5–15)
BUN: 7 mg/dL — ABNORMAL LOW (ref 8–23)
CO2: 31 mmol/L (ref 22–32)
Calcium: 8.4 mg/dL — ABNORMAL LOW (ref 8.9–10.3)
Chloride: 101 mmol/L (ref 98–111)
Creatinine, Ser: 0.49 mg/dL (ref 0.44–1.00)
GFR, Estimated: >60 mL/min (ref >60)
Glucose, Bld: 127 mg/dL — ABNORMAL HIGH (ref 70–99)
Potassium: 3.8 mmol/L (ref 3.5–5.1)
Sodium: 139 mmol/L (ref 135–145)
Total Bilirubin: 0.6 mg/dL (ref 0.3–1.2)
Total Protein: 4.6 g/dL — ABNORMAL LOW (ref 6.5–8.1)

## 2021-07-28 LAB — MAGNESIUM: Magnesium: 1.8 mg/dL (ref 1.7–2.4)

## 2021-07-28 MED ORDER — ORAL CARE MOUTH RINSE: 15.0000 mL | OROMUCOSAL | Status: DC | PRN

## 2021-07-28 MED ORDER — HYDROMORPHONE HCL 1 MG/ML IJ SOLN
0.5000 mg | INTRAMUSCULAR | Status: DC | PRN
  Administered 2021-07-28 – 2021-07-29 (×4): 0.5 mg via INTRAVENOUS
  Filled 2021-07-28 (×4): qty 0.5

## 2021-07-28 MED ORDER — ORAL CARE MOUTH RINSE
15.0000 mL | OROMUCOSAL | Status: DC
  Administered 2021-07-28 – 2021-08-06 (×28): 15 mL via OROMUCOSAL

## 2021-07-28 MED ORDER — NALOXONE HCL 0.4 MG/ML IJ SOLN
0.4000 mg | INTRAMUSCULAR | Status: DC | PRN
  Administered 2021-07-28: 0.4 mg via INTRAVENOUS

## 2021-07-28 MED ORDER — MORPHINE SULFATE (PF) 2 MG/ML IV SOLN: 2.0000 mg | INTRAVENOUS | Status: DC | PRN

## 2021-07-28 MED ORDER — CHLORHEXIDINE GLUCONATE CLOTH 2 % EX PADS
6.0000 | MEDICATED_PAD | Freq: Every day | CUTANEOUS | Status: DC
Start: 2021-07-28 — End: 2021-08-04
  Administered 2021-07-28 – 2021-08-03 (×5): 6 via TOPICAL

## 2021-07-28 MED ORDER — NALOXONE HCL 0.4 MG/ML IJ SOLN
INTRAMUSCULAR | Status: AC
  Filled 2021-07-28: qty 1

## 2021-07-28 MED ORDER — HYDROMORPHONE HCL 1 MG/ML IJ SOLN: 0.5000 mg | INTRAMUSCULAR | Status: DC | PRN

## 2021-07-28 MED ORDER — ACETAMINOPHEN 10 MG/ML IV SOLN
1000.0000 mg | Freq: Four times a day (QID) | INTRAVENOUS | Status: DC
Start: 2021-07-28 — End: 2021-07-29
  Administered 2021-07-28 – 2021-07-29 (×3): 1000 mg via INTRAVENOUS
  Filled 2021-07-28 (×4): qty 100

## 2021-07-28 NOTE — Progress Notes (Addendum)
Daily Rounding Note  07/28/2021, 10:13 AM  LOS: 4 days   SUBJECTIVE:   Chief complaint:  large pancreatic fluid collections  Pt obtunded, not able to provide any answers to inquiries. RN reports pt is either "out of it" or agitated, not venturing in to space of in between the extremes.   Placed on venturi mask at 8 L/minute due to hypoxia, mouth breathing.   Oxygen sats low to mid 90s.  HR to 130s, now in 1teens.  Diastolic BPs 95M to 84X. Received 4 mg Dilaudid yesterday, 2 mg so far today.  Getting Ativan as well.    OBJECTIVE:         Vital signs in last 24 hours:    Temp:  [97.4 F (36.3 C)-98.5 F (36.9 C)] 98.5 F (36.9 C) (07/03 0700) Pulse Rate:  [99-149] 114 (07/03 0800) Resp:  [11-20] 19 (07/03 0752) BP: (89-108)/(57-83) 97/63 (07/03 0800) SpO2:  [90 %-99 %] 95 % (07/03 0752) Last BM Date : 07/25/21 Filed Weights   07/26/21 0411  Weight: 70.4 kg   General: eyes open but obtunded.   Pale.  Looks ill Heart: tachy, regular Chest: even, non-labored breathing.   Abdomen: tight, protuberant, no obvious masses.  BS present, none are high pitched or tympanitis  Extremities: warm hands and feet, no edema Neuro/Psych:  not following commands.  No involuntary movement.      Intake/Output from previous day: 07/02 0701 - 07/03 0700 In: 1902.7 [P.O.:520; I.V.:1382.7] Out: 300 [Urine:300]    Lab Results: Recent Labs    07/26/21 0619 07/27/21 0009 07/28/21 0417  WBC 17.5* 13.2* 13.5*  HGB 13.7 12.5 12.5  HCT 42.0 39.1 41.3  PLT 591* 569* 511*   BMET Recent Labs    07/26/21 0619 07/27/21 0009 07/28/21 0417  NA 138 136 139  K 4.2 3.6 3.8  CL 98 96* 101  CO2 '30 31 31  '$ GLUCOSE 144* 105* 127*  BUN 6* 6* 7*  CREATININE 0.41* 0.41* 0.49  CALCIUM 8.9 8.7* 8.4*   LFT Recent Labs    07/26/21 0619 07/27/21 0009 07/28/21 0417  PROT 5.4* 4.8* 4.6*  ALBUMIN 2.8* 2.5* 2.4*  AST '25 29 22  '$ ALT '16 14 12   '$ ALKPHOS 100 92 92  BILITOT 0.4 0.2* 0.6     Studies/Results: CT ABDOMEN PELVIS W CONTRAST  Result Date: 07/27/2021 CLINICAL DATA:  Pancreatitis, acute, severe EXAM: CT ABDOMEN AND PELVIS WITH CONTRAST TECHNIQUE: Multidetector CT imaging of the abdomen and pelvis was performed using the standard protocol following bolus administration of intravenous contrast. RADIATION DOSE REDUCTION: This exam was performed according to the departmental dose-optimization program which includes automated exposure control, adjustment of the mA and/or kV according to patient size and/or use of iterative reconstruction technique. CONTRAST:  83m OMNIPAQUE IOHEXOL 300 MG/ML  SOLN COMPARISON:  CT abdomen pelvis 07/24/2021 FINDINGS: Lower chest: Bilateral trace volume pleural effusions. Associated bilateral lower lobe passive atelectasis. Hepatobiliary: Pancreas: Previously identified peripancreatic lesion is now heterogeneous and slightly increased in size measuring 6.2 x 3.2 cm (from 5.7 x 2.7 cm). Increased heterogeneity of the lesion with density of 51 Hounsfield units. Focal main pancreatic duct dilatation measuring up to 5 mm. Spleen: Normal in size without focal abnormality. Adrenals/Urinary Tract: No adrenal nodule bilaterally. Bilateral kidneys enhance symmetrically. No hydronephrosis. No hydroureter. The urinary bladder is unremarkable. Stomach/Bowel: There is a 12 x 9 x 19 cm cystic lesion likely arising from the greater curvature  of the stomach. No evidence of bowel wall thickening or dilatation. Appendix appears normal. Vascular/Lymphatic: Marked narrowing of the splenic artery along the posterior pancreas coarse if concern for nonocclusive thrombosis (3:35). No abdominal aorta or iliac aneurysm. Moderate to severe atherosclerotic plaque of the aorta and its branches. No abdominal, pelvic, or inguinal lymphadenopathy. Reproductive: Coarsely calcified uterine fibroids. Otherwise uterus and bilateral adnexa are  unremarkable. Other: Trace free fluid within the abdomen and pelvis centered along the pancreas. No intraperitoneal free gas. No organized fluid collection. Musculoskeletal: Subcutaneus soft tissue edema. No suspicious lytic or blastic osseous lesions. No acute displaced fracture. Multilevel degenerative changes of the spine. IMPRESSION: 1. Bilateral trace volume pleural effusions. 2. Previously identified peripancreatic lesion is now heterogeneous and slightly increased in size measuring 6.2 x 3.2 cm (from 5.7 x 2.7 cm). Interval increase in density within the lesion (50 Hounsfield units) is concerning for underlying hemorrhage. When the patient is clinically stable and able to follow directions and hold their breath (preferably as an outpatient) further evaluation with dedicated abdominal MRI pancreatic protocol should be considered. 3. Marked narrowing of the splenic artery along the posterior pancreas coarse if concern for nonocclusive thrombosis. 4. Trace free fluid within the abdomen and pelvis centered along the pancreas. Pancreatitis is not excluded. Correlate with lipase levels. 5. A 12 x 9 x 19 cm cystic lesion likely arising from the greater curvature of the stomach. Query gist versus pancreatic pseudocyst versus pancreatic neoplasm related to the above findings. These results will be called to the ordering clinician or representative by the Radiologist Assistant, and communication documented in the PACS or [employer]. Electronically Signed   By: Iven Finn M.D.   On: 07/27/2021 16:39   DG CHEST PORT 1 VIEW  Result Date: 07/27/2021 CLINICAL DATA:  Shortness of breath.  Wheezing. EXAM: PORTABLE CHEST 1 VIEW COMPARISON:  07/24/2021 FINDINGS: Mild cardiomegaly shows increase since prior study. New mild diffuse interstitial infiltrates are suspicious for mild interstitial edema. New small bilateral pleural effusions and bibasilar atelectasis also noted. IMPRESSION: Mild acute congestive heart  failure with small bilateral pleural effusions. Electronically Signed   By: Marlaine Hind M.D.   On: 07/27/2021 12:00   DG Abd Portable 1V  Result Date: 07/26/2021 CLINICAL DATA:  Abdominal distension. EXAM: PORTABLE ABDOMEN - 1 VIEW COMPARISON:  10/06/2017.  CT, 07/24/2021. FINDINGS: There is no bowel dilation to suggest obstruction. Soft tissues are poorly defined. No evidence of renal or ureteral stones. IMPRESSION: No acute findings or evidence of bowel obstruction. Electronically Signed   By: Lajean Manes M.D.   On: 07/26/2021 18:07    Scheduled Meds:  folic acid  1 mg Intravenous Daily   LORazepam  0-4 mg Intravenous Q4H   Followed by   Derrill Memo ON 07/29/2021] LORazepam  0-4 mg Intravenous Q8H   nicotine  21 mg Transdermal Daily   pantoprazole (PROTONIX) IV  40 mg Intravenous Q12H   thiamine  100 mg Intravenous Daily   Continuous Infusions:  lactated ringers 100 mL/hr at 07/28/21 0114   PRN Meds:.acetaminophen **OR** acetaminophen, albuterol, HYDROmorphone (DILAUDID) injection, LORazepam **OR** LORazepam, ondansetron **OR** ondansetron (ZOFRAN) IV  ASSESMENT:   Acute pancreatitis, likely ETOH etiology. Large pancreatic fluid collection, evolving pseudocyst compressing stomach.  Focal abnormality at body/neck of pancreas concerning for neoplasm. Repeat CTAP yesterday 7/2: Slight increase in now heterogeneous pancreatic lesion measuring 6.2 x 3.2 cm, up from 5.7 x 2.7 cm.  Increase in density of lesion concerning for underlying hemorrhage.  Recommend  pancreatic protocol MRI when patient able to cooperate.  Narrowing of splenic artery in the region of posterior pancreas concerning for nonocclusive thrombosis.  Trace abdominal and pelvic free fluid.  Pancreatitis not excluded.  12 x 9 x 19 cm cystic lesion arising from greater curvature of stomach, GIST vs pseudocyst vs pancreatic neoplasm. LFTs normal x for hypoalbuminemia.  Lipase 109.. 129.  WBCs 17.5.. 13.2.    FOBT positive, intermittent  dark stools.  Low suspicion for significant GI bleed.  ?nonexclusive splenic artery thrombus?, patient not on anticoagulation.   ETOH use disorder.  On CIWA protocol.    Fatty liver, scattered hepatic hypodensities, possibly representing cysts or areas of focal fat.  Would be better imaged by liver protocol MRI.  Platelets, INR normal.      Macrocytosis wo anemia.     PLAN     Begin Heparin?, given ? Of pancreatic cyst hemorrhage, may not be adviseable.       Azucena Freed  07/28/2021, 10:13 AM Phone [place] Attending   I have taken an interval history, reviewed the chart and examined the patient. I agree with the Advanced Practitioner's note, impression and recommendations with the following additional comments:  Patient was awake and alert for my interview/exam.  I think she has a primary pancreatic process.  I have never seen any type of fluid collection in the size of this be related to the stomach itself as per the CT report.  I suspect this is a peripancreatic fluid collection.  It is massive and compressing the stomach almost completely.  She should not be allowed to eat.  She may require TPN. I fear there is significant aspiration risk here if we start p.o. intake.  At this point continue aggressive supportive care.  She is not ready for any type of MRI to better understand things.  Given the concern for potential hemorrhage into her pancreatic lesion I would avoid any anticoagulants at this point.   At some point aspiration versus drainage of this massive fluid collection will be needed, I think.  We will have limited availability to do that with the endoscopic approach as our interventional endoscopist this is not always available and is [place].  Aspiration by interventional radiology is something to be considered.  I will consult them for their opinion.  We will follow.   Gatha Mayer, MD, [place]  [facility] See Shea Evans on call - gastroenterology for best contact person 07/28/2021 1:33 PM

## 2021-07-28 NOTE — Consult Note (Signed)
NAME:  Kathryn Underwood, MRN:  [id number], DOB:  01-01-1961, LOS: 4 ADMISSION DATE:  07/24/2021, CONSULTATION DATE:  7/3 REFERRING MD: Dr. Posey Pronto, CHIEF COMPLAINT: Altered mental status   History of Present Illness:  61 y/o F who presented to [facility] on 6/29 with report of two weeks of abdominal pain.   She has a hx of ETOH abuse, typically drinking 1 bottle of wine per day. She was admitted per [facility] with concern for acute pancreatitis. ABD XRAY was negative for ileus.  GI was consulted for evaluation. She was placed on CIWA protocol.  The patient received narcotics for abdominal pain.  She became lethargic on 7/2 and was transferred to PCU. CT imaging concerning for pancreatitis, a large pancreatitic cyst with compression on the stomach, possible non-occlusive splenic vein thrombosis.  She was aggressively fluid resuscitated and given antiemetics.  She was initially treated with IV antibiotics and steroids. There were concerns for possible COPD exacerbation.  Imaging revealed a 13m pulmonary nodule. Repeat CT ABD imaging showed pancreatic hemorrhage along the lesion and worsening pseudocyst.  On 7/3, RRT followed up on patient for lethargy.  She initially required 15L O2 but was given narcan and weaned down to 4L.    PCCM consulted for evaluation of lethargy, concern for hypercarbic respiratory failure.   Pertinent  Medical History  ETOH Abuse Depression  Anxiety  Pancreatitis  GERD   Significant Hospital Events: Including procedures, antibiotic start and stop dates in addition to other pertinent events   6/29 Admit with abd pain 7/2 Tx to PCU for lethargy  7/3 PCCM consulted for lethargy  Interim History / Subjective:  Afebrile  Pt reports she "doesn't feel great"  Objective   Blood pressure 110/65, pulse (!) 114, temperature 98.8 F (37.1 C), temperature source Axillary, resp. rate (!) 22, weight 70.4 kg, SpO2 96 %.    FiO2 (%):  [35 %-40 %] 35 %   Intake/Output Summary (Last 24  hours) at 07/28/2021 1244 Last data filed at 07/28/2021 0114 Gross per 24 hour  Intake 1902.72 ml  Output 300 ml  Net 1602.72 ml   Filed Weights   07/26/21 0411  Weight: 70.4 kg    Examination: General: adult female, appears older than stated age, lying in bed in NAD HENT: MM pink/dry, anicteric, VM in place  Lungs: mild tachypnea but non-labored at rest, lungs bilaterally distant but clear  Cardiovascular: S1S2 RRR, no m/r/g Abdomen: protuberant, soft, bsx4 active Extremities: warm/dry, no edema Neuro: Awake/alert to voice, speech clear, MAE   Resolved Hospital Problem list     Assessment & Plan:   Acute Pancreatitis, suspected ETOH Etiology without Necrosis  Large Pancreatitic Pseudocyst with Stomach Compression  Narrowing of Splenic Artery, concern for Non-Occlusive Thrombosis  Abdominal Pain  FOBT Positive  Low suspicion for GIB. Repeat CT abd imaging with possible pancreatic hemorrhage and worsening pseudocyst.  -continue gentle IVF, LR at 100 ml/hr -judicious pain control given sedation  -appreciate GI rec's  -hold anticoagulation for now given concern for hemorrhage -transfer to ICU for observation -PRN tylenol for pain   ETOH Abuse  Hx of 1 bottle of wine per day.  -CIWA protocol  -minimize sedating as able  -continue thiamine, folate, MVI  ST  Intermittent, in setting of pancreatitis  -supportive care   COPD without Exacerbation  Tobacco Abuse -supportive care  -PRn albuterol   Acute Respiratory Failure with Hypoxia Suspect in setting of shunt / splinting due to pain  -wean O2 for sats >  90% -encourage IS, mobilize   Left Pulmonary Nodule  Ground glass 10 mm nodule in the LUL -will need follow up CT in office in office to ensure no change in size  Macrocytosis without Anemia  -monitor   Depression / Anxiety  -supportive care  Best Practice (right click and "Reselect all SmartList Selections" daily)  Diet/type: NPO DVT prophylaxis: SCD GI  prophylaxis: PPI Lines: N/A Foley:  N/A Code Status:  full code Last date of multidisciplinary goals of care discussion: per primary   Labs   CBC: Recent Labs  Lab 07/24/21 0827 07/24/21 1703 07/25/21 0027 07/25/21 0903 07/25/21 1549 07/26/21 0020 07/26/21 0619 07/27/21 0009 07/28/21 0417  WBC 11.3* 13.9* 11.0*  --   --   --  17.5* 13.2* 13.5*  NEUTROABS 7.3 9.5*  --   --   --   --  15.9* 10.1* 10.9*  HGB 14.8 15.1* 13.6   < > 13.4 12.4 13.7 12.5 12.5  HCT 44.4 45.0 40.8   < > 39.7 37.1 42.0 39.1 41.3  MCV 103.0* 105.9* 106.3*  --   --   --  107.7* 108.3* 112.2*  PLT 621* 608* 541*  --   --   --  591* 569* 511*   < > = values in this interval not displayed.    Basic Metabolic Panel: Recent Labs  Lab 07/24/21 1703 07/25/21 0027 07/26/21 0619 07/27/21 0009 07/28/21 0417  NA 138 137 138 136 139  K 3.8 3.9 4.2 3.6 3.8  CL 100 102 98 96* 101  CO2 '28 29 30 31 31  '$ GLUCOSE 111* 93 144* 105* 127*  BUN 5* <5* 6* 6* 7*  CREATININE 0.54 0.51 0.41* 0.41* 0.49  CALCIUM 8.6* 8.4* 8.9 8.7* 8.4*  MG 2.0  --  2.0 2.1 1.8  PHOS 3.5  --   --   --   --    GFR: Estimated Creatinine Clearance: 68.6 mL/min (by C-G formula based on SCr of 0.49 mg/dL). Recent Labs  Lab 07/24/21 1703 07/24/21 2007 07/25/21 0027 07/26/21 7371 07/27/21 0009 07/27/21 0925 07/28/21 0417  PROCALCITON <0.10  --   --   --   --  <0.10  --   WBC 13.9*  --  11.0* 17.5* 13.2*  --  13.5*  LATICACIDVEN 1.2 1.2  --   --   --  0.7  --     Liver Function Tests: Recent Labs  Lab 07/24/21 1703 07/25/21 0027 07/26/21 0619 07/27/21 0009 07/28/21 0417  AST '31 24 25 29 22  '$ ALT '16 14 16 14 12  '$ ALKPHOS 124 102 100 92 92  BILITOT 0.7 0.5 0.4 0.2* 0.6  PROT 6.1* 4.7* 5.4* 4.8* 4.6*  ALBUMIN 3.1* 2.4* 2.8* 2.5* 2.4*   Recent Labs  Lab 07/24/21 0827 07/26/21 0619  LIPASE 109* 129*   No results for input(s): "AMMONIA" in the last 168 hours.  ABG    Component Value Date/Time   PHART 7.24 (L)  07/28/2021 1132   PCO2ART 91 (HH) 07/28/2021 1132   PO2ART 193 (H) 07/28/2021 1132   HCO3 39.0 (H) 07/28/2021 1132   TCO2 25 10/03/2017 2354   ACIDBASEDEF 1.0 10/03/2017 2354   O2SAT 99.4 07/28/2021 1132     Coagulation Profile: Recent Labs  Lab 07/25/21 0027  INR 1.1    Cardiac Enzymes: No results for input(s): "CKTOTAL", "CKMB", "CKMBINDEX", "TROPONINI" in the last 168 hours.  HbA1C: Hgb A1c MFr Bld  Date/Time Value Ref Range Status  12/09/2015 03:45  PM 5.5 4.6 - 6.5 % Final    Comment:    Glycemic Control Guidelines for People with Diabetes:Non Diabetic:  <6%Goal of Therapy: <7%Additional Action Suggested:  >8%     CBG: Recent Labs  Lab 07/27/21 0844 07/27/21 1208  GLUCAP 90 101*    Review of Systems: Positives in [place]  Gen: Denies fever, chills, weight change, fatigue, night sweats HEENT: Denies blurred vision, double vision, hearing loss, tinnitus, sinus congestion, rhinorrhea, sore throat, neck stiffness, dysphagia PULM: Denies shortness of breath, cough, sputum production, hemoptysis, wheezing CV: Denies chest pain, edema, orthopnea, paroxysmal nocturnal dyspnea, palpitations GI: Denies abdominal pain, nausea, vomiting, diarrhea, hematochezia, melena, constipation, change in bowel habits GU: Denies dysuria, hematuria, polyuria, oliguria, urethral discharge Endocrine: Denies hot or cold intolerance, polyuria, polyphagia or appetite change Derm: Denies rash, dry skin, scaling or peeling skin change Heme: Denies easy bruising, bleeding, bleeding gums Neuro: Denies headache, numbness, weakness, slurred speech, loss of memory or consciousness  Past Medical History:  She,  has a past medical history of Alcohol abuse, Anxiety, Chickenpox, Depression, Neuromuscular disorder ([place]), and Substance abuse ([place]).   Surgical History:   Past Surgical History:  Procedure Laterality Date   CESAREAN SECTION     TONSILLECTOMY       Social History:   reports that she has  been smoking cigarettes. She has been smoking an average of .5 packs per day. She has never used smokeless tobacco. She reports current alcohol use of about 16.0 standard drinks of alcohol per week. She reports current drug use. Drugs: Cocaine and Marijuana.   Family History:  Her family history includes Arthritis in her father; Asthma in her brother and father; Dementia in her maternal grandmother and mother; Diabetes in her maternal grandmother and paternal grandmother; Parkinson's disease in her maternal grandfather and paternal grandfather. There is no history of Breast cancer or Colon cancer.   Allergies No Known Allergies   Home Medications  Prior to Admission medications   Medication Sig Start Date End Date Taking? Authorizing Provider  Eszopiclone 3 MG TABS Take 3 mg by mouth at bedtime as needed for sleep. Take immediately before bedtime   Yes [provider]  gabapentin (NEURONTIN) 300 MG capsule Take 2 capsules (600 mg total) by mouth 2 (two) times daily. 10/09/17  Yes Malvin Johns, MD  mirtazapine (REMERON) 45 MG tablet Take 45 mg by mouth at bedtime. 07/17/21  Yes [provider]  QUEtiapine (SEROQUEL) 50 MG tablet Take 1 tablet by mouth at bedtime. 07/17/21  Yes [provider]  chlordiazePOXIDE (LIBRIUM) 25 MG capsule '50mg'$  PO TID x 1D, then 25-'50mg'$  PO BID X 1D, then 25-'50mg'$  PO QD X 1D Patient not taking: Reported on 07/23/2021 03/24/18   Isla Pence, MD  cyclobenzaprine (FLEXERIL) 10 MG tablet Take 1 tablet (10 mg total) by mouth 3 (three) times daily as needed for muscle spasms. Patient not taking: Reported on 07/23/2021 10/09/17   Malvin Johns, MD  folic acid (FOLVITE) 1 MG tablet Take 1 tablet (1 mg total) by mouth daily. Patient not taking: Reported on 07/23/2021 04/29/16   Raiford Noble Latif, DO  Multiple Vitamin (MULTIVITAMIN WITH MINERALS) TABS tablet Take 1 tablet by mouth daily. Patient not taking: Reported on 07/24/2021 10/09/17   Georgette Shell, MD  nicotine (NICODERM CQ - DOSED IN MG/24 HOURS) 21 mg/24hr patch Place 1 patch (21 mg total) onto the skin daily. Patient not taking: Reported on 07/23/2021 10/09/17   Georgette Shell, MD  pantoprazole (PROTONIX) 20 MG tablet Take 1 tablet (20 mg total) by mouth daily. Patient not taking: Reported on 07/23/2021 09/20/18   Caccavale, Sophia, PA-C  potassium chloride SA (K-DUR,KLOR-CON) 20 MEQ tablet Take 1 tablet (20 mEq total) by mouth daily. Patient not taking: Reported on 07/23/2021 10/09/17   Malvin Johns, MD  sucralfate (CARAFATE) 1 GM/10ML suspension Take 10 mLs (1 g total) by mouth 4 (four) times daily -  with meals and at bedtime. Patient not taking: Reported on 07/23/2021 09/20/18   Caccavale, Sophia, PA-C  thiamine 100 MG tablet Take 1 tablet (100 mg total) by mouth daily. Patient not taking: Reported on 07/23/2021 10/09/17   Georgette Shell, MD     Critical care time: 63 minutes    Noe Gens, MSN, APRN, NP-C, AGACNP-BC [facility] 07/28/2021, 12:44 PM   Please see [email or web address] for pager details.   From 7A-7P if no response, please call [phone] After hours, please call ELink [phone]

## 2021-07-28 NOTE — TOC Progression Note (Signed)
Transition of Care [facility]) - Progression Note    Patient Details  Name: Kathryn Underwood MRN: [id number] Date of Birth: 1960/05/08  Transition of Care [facility]) CM/SW Contact  Tom-Lipton, Renea Ee, RN Phone Number: 07/28/2021, 10:09 AM  Clinical Narrative:     MATCH done for prescription assistance. MD to send prescription to [facility] at discharge.  No PT/OT needs or recommendations noted. CM will continue to follow with needs.   Expected Discharge Plan: Home/Self Care Barriers to Discharge: Continued Medical Work up  Expected Discharge Plan and Services Expected Discharge Plan: Home/Self Care In-house Referral: Financial Counselor Discharge Planning Services: Medication Assistance   Living arrangements for the past 2 months: Single Family Home                 DME Arranged: N/A         HH Arranged: NA           Social Determinants of Health (SDOH) Interventions    Readmission Risk Interventions     No data to display

## 2021-07-28 NOTE — Significant Event (Addendum)
Rapid Response Event Note   Reason for Call :  Decreased level of consciousness. Shallow respirations with periods of apnea.  Pt on 8L/40% Venturi mask- RR 8- SpO2 88-90%  Initial Focused Assessment:  Pt lying in bed, lethargic. Expiratory wheeze heard throughout, inspiratory wheeze heard on the left. Skin is warm, dry, pale. Weak and congested cough with minimal gag reflex. PERRLA, 59m, sluggish.   VS following interventions: T 98.47F, BP 110/65, HR 120, RR 21, SPO2 100% on 8L/40% Venturi mask  Interventions:  -100% NRB -ABG 7.24/ 91/ 193/ 39 -Narcan 0.'4mg'$  IV x1 -CXR  Following IV Narcan, pt is drowsy, oriented. Respirations remain shallow, but have improved in effort.   Plan of Care:  Transfer to ICU  Event Summary:  MD Notified: Dr. PPosey ProntoArrival Time: 1120 End Time: 1[place] RN

## 2021-07-28 NOTE — Progress Notes (Signed)
Progress Note Patient: Kathryn Underwood VZS:[id number] DOB: [birth year] DOA: 07/24/2021  DOS: the patient was seen and examined on 07/28/2021  Brief hospital course: 61 year old female with past medical history of anxiety, depression, GERD, alcohol use drinks 1 wine bottle per day presented with abdominal pain ongoing for 2 weeks. Found to have acute pancreatitis as well as pancreatic cyst. GI consulted.conservative measures initiated. 7/1 diet advanced to clear liquid diet, x-ray abdomen was unremarkable.  Transfer to progressive care unit for acute delirium. 7/2 remains lethargic and drowsy after receiving Ativan.  CT abdomen pelvis shows possibility of pancreatic lesion hemorrhage and worsening of the pseudocyst size as well as possibility of nonocclusive splenic vein thrombosis.  Discussed with GI recommended to continue current management, no indication for anticoagulation especially given risk. 7/3, transferred to ICU for close observation due to excessive sedation and acute hypercarbic respiratory failure.  Assessment and Plan: Alcohol induced acute pancreatitis without necrosis or infection Pancreatic pseudocyst Pancreas hemorrhage GI consulted. Patient drinks 1 wine bottle a day. CT scan shows evidence of pancreatitis as well as possible pancreatic lesion. Also evidence of large pseudocyst with compression of the stomach. Currently plan is conservative measures. Patient was not clear liquid diet but due to lethargy currently NPO. X-ray abdomen negative for any ileus. Due to ongoing abdominal pain a CT abdomen pelvis with contrast was repeated on 7/2 which shows pancreatic hemorrhage and the lesion along with worsening pseudocyst. Discussed with Dr. Henrene Pastor, recommend to continue current management for now. Continue IV fluids, IV nausea medication.  Monitor. IR was consulted.  Patient will be undergoing drainage of the pancreatic fluid collection.  Appreciate their assistance.  Consent  signed.   Alcohol dependence with unspecified alcohol-induced disorder (HCC) Delirium tremens ([place]) Drinks 1 bottle of wine a day. Last drink was 1 day before admission. Currently on CIWA protocol.  Transferred to progressive care unit on 7/2 early in the morning due to severe delirium. Monitor.  Acute hypoxic and hypercarbic respiratory failure Secondary to combination of hypoventilation from abdominal pain and distention as well as delirium and need for IV benzodiazepine and IV pain medication for symptom control. ABG shows pH of 7.2, PCO2 of 91. Consulted PCCM.  Patient transferred to ICU. Not a good candidate for BiPAP therapy given her abdominal distention situation has remained very high risk for aspiration. Received IV Narcan.  Minimize narcotics.   Thrombosis of splenic artery (HCC) For now we will monitor.  No anticoagulation given concern with bleeding in the pancreatic lesion.   Sinus tachycardia Intermittent.  Expected secondary to pancreatitis.  Monitor for now.  Would not initiate rate control medication for therapy unless indicated.Marland Kitchen   COPD (chronic obstructive pulmonary disease) There was some concern for COPD exacerbation, currently ruled out. Patient was started on IV steroids and IV antibiotics.  Procalcitonin level is negative.  Do not think that the patient has any infection.  Discontinue antibiotics discontinue steroid.   Left pulmonary nodule. 10 mm left ground-glass pulmonary nodule within the upper lobe. Recommend a non-contrast Chest CT at 6-12 months to confirm persistence, then additional non-contrast Chest CTs every 2 years until 5 years. If nodule grows or develops solid component(s), consider resection   Uterine fibroid Incidentally seen. Outpatient follow-up.   Depression with anxiety Insomnia On Eszopiclone at home.  Also on Seroquel along with Remeron, gabapentin.  Currently on hold. Currently on IV Ativan.  Subjective: Patient is significantly  drowsy and lethargic.  Received IV Narcan.  Later in the day mentation  improves and reports that she is in severe pain.  No nausea or vomiting.  Physical Exam: Vitals:   07/28/21 1424 07/28/21 1500 07/28/21 1600 07/28/21 1700  BP:  (!) 101/55 116/74 111/68  Pulse:  (!) 103 (!) 106 (!) 103  Resp:  (!) 22 18 (!) 27  Temp:  98.8 F (37.1 C)    TempSrc:  Oral    SpO2: 97% 98% 95% 95%  Weight:       General: Appear in moderate distress; no visible Abnormal Neck Mass Or lumps, Conjunctiva normal Cardiovascular: S1 and S2 Present, no Murmur, Respiratory: increased respiratory effort, Bilateral Air entry present and bilateral  Crackles, Occasional  wheezes Abdomen: Bowel Sound present, diffusely tender, distended Extremities: no Pedal edema Neurology: lethargic and oriented to time, place, and person later in the day, no focal deficit   Gait not checked due to patient safety concerns   Data Reviewed: I have Reviewed nursing notes, Vitals, and Lab results since pt's last encounter. Pertinent lab results CBC and CMP I have ordered test including CBC and CMP ABG I have ordered imaging studies chest x-ray. I have discussed pt's care plan and test results with PCCM.   Family Communication: Discussed with significant other on the phone  Disposition: Status is: Inpatient Remains inpatient appropriate because: Transferred to ICU for close observation. The patient is critically ill with multiple organ systems failure and requires high complexity decision making for assessment and support, frequent evaluation and titration of therapies. Critical Care Time devoted to patient care services described in this note is 35 minutes   Author: Berle Mull, MD 07/28/2021 6:21 PM  Please look on [email or web address] to find out who is on call.

## 2021-07-28 NOTE — Consult Note (Addendum)
Chief Complaint: Patient was seen in consultation today for  Chief Complaint  Patient presents with   Abdominal Pain   at the request of Dr. Carlean Purl  Referring Physician(s): Dr. Silvano Rusk  Supervising Physician: Jacqulynn Cadet  Patient Status: [facility] - In-pt  History of Present Illness: Kathryn Underwood is a 61 y.o. female with history of Etoh abuse and pancreatitis.  Imaginig demonstrates large fluid collection causing gastric compression, possibly an evolving pseudocyst. Also a focal abnormality of pancreatic body / neck region with dilation of PD distal to area raising concern for neoplasm.  IR consulted to treat the fluid collection.  Dr. Laurence Ferrari has reviewed the case and has approved aspiration alone of the fluid collection for comfort. She has been experiencing lethargy and decreased level of consciousness during hospitalization and required Narcan earlier this day.    Past Medical History:  Diagnosis Date   Alcohol abuse    Anxiety    Chickenpox    Depression    Neuromuscular disorder ([place])    neuropathy   Substance abuse ([place])     Past Surgical History:  Procedure Laterality Date   CESAREAN SECTION     TONSILLECTOMY      Allergies: Patient has no known allergies.  Medications: Prior to Admission medications   Medication Sig Start Date End Date Taking? Authorizing Provider  Eszopiclone 3 MG TABS Take 3 mg by mouth at bedtime as needed for sleep. Take immediately before bedtime   Yes [provider]  gabapentin (NEURONTIN) 300 MG capsule Take 2 capsules (600 mg total) by mouth 2 (two) times daily. 10/09/17  Yes Malvin Johns, MD  mirtazapine (REMERON) 45 MG tablet Take 45 mg by mouth at bedtime. 07/17/21  Yes [provider]  QUEtiapine (SEROQUEL) 50 MG tablet Take 1 tablet by mouth at bedtime. 07/17/21  Yes [provider]  chlordiazePOXIDE (LIBRIUM) 25 MG capsule '50mg'$  PO TID x 1D, then 25-'50mg'$  PO BID X 1D, then 25-'50mg'$  PO QD X  1D Patient not taking: Reported on 07/23/2021 03/24/18   Isla Pence, MD  cyclobenzaprine (FLEXERIL) 10 MG tablet Take 1 tablet (10 mg total) by mouth 3 (three) times daily as needed for muscle spasms. Patient not taking: Reported on 07/23/2021 10/09/17   Malvin Johns, MD  folic acid (FOLVITE) 1 MG tablet Take 1 tablet (1 mg total) by mouth daily. Patient not taking: Reported on 07/23/2021 04/29/16   Raiford Noble Latif, DO  Multiple Vitamin (MULTIVITAMIN WITH MINERALS) TABS tablet Take 1 tablet by mouth daily. Patient not taking: Reported on 07/24/2021 10/09/17   Georgette Shell, MD  nicotine (NICODERM CQ - DOSED IN MG/24 HOURS) 21 mg/24hr patch Place 1 patch (21 mg total) onto the skin daily. Patient not taking: Reported on 07/23/2021 10/09/17   Georgette Shell, MD  pantoprazole (PROTONIX) 20 MG tablet Take 1 tablet (20 mg total) by mouth daily. Patient not taking: Reported on 07/23/2021 09/20/18   Caccavale, Sophia, PA-C  potassium chloride SA (K-DUR,KLOR-CON) 20 MEQ tablet Take 1 tablet (20 mEq total) by mouth daily. Patient not taking: Reported on 07/23/2021 10/09/17   Malvin Johns, MD  sucralfate (CARAFATE) 1 GM/10ML suspension Take 10 mLs (1 g total) by mouth 4 (four) times daily -  with meals and at bedtime. Patient not taking: Reported on 07/23/2021 09/20/18   Caccavale, Sophia, PA-C  thiamine 100 MG tablet Take 1 tablet (100 mg total) by mouth daily. Patient not taking: Reported on 07/23/2021 10/09/17   Landis Gandy  G, MD     Family History  Problem Relation Age of Onset   Dementia Mother    Arthritis Father    Asthma Father    Asthma Brother    Diabetes Maternal Grandmother    Dementia Maternal Grandmother    Diabetes Paternal Grandmother    Parkinson's disease Maternal Grandfather    Parkinson's disease Paternal Grandfather    Breast cancer Neg Hx    Colon cancer Neg Hx     Social History   Socioeconomic History   Marital status: Divorced    Spouse name: Not on  file   Number of children: Not on file   Years of education: Not on file   Highest education level: Not on file  Occupational History   Not on file  Tobacco Use   Smoking status: Every Day    Packs/day: 0.50    Types: Cigarettes   Smokeless tobacco: Never  Substance and Sexual Activity   Alcohol use: Yes    Alcohol/week: 16.0 standard drinks of alcohol    Types: 16 Glasses of wine per week    Comment: daily (1.5 liter bottle wine every 12)   Drug use: Yes    Types: Cocaine, Marijuana   Sexual activity: Yes    Partners: Male    Comment: female partner  Other Topics Concern   Not on file  Social History Narrative   Divorced. Homemaker. HS grad.    Lives with life partner (stan)   Drinks caffeine.   Wears seatbelt. Smoke dectector in the home. No firearms in the home.    Feels safe in her relationships.     Social Determinants of Health   Financial Resource Strain: Not on file  Food Insecurity: Not on file  Transportation Needs: Not on file  Physical Activity: Not on file  Stress: Not on file  Social Connections: Not on file     Review of Systems: not performed, clinical status  Vital Signs: BP 108/73 (BP Location: Left Arm)   Pulse (!) 107   Temp 98.5 F (36.9 C) (Axillary)   Resp (!) 26   Wt 159 lb 13.3 oz (72.5 kg)   SpO2 97%   BMI 28.31 kg/m   Imaging: DG Chest Port 1 View  Result Date: 07/28/2021 CLINICAL DATA:  Acute respiratory distress. EXAM: PORTABLE CHEST 1 VIEW COMPARISON:  Chest radiograph July 27, 2021. FINDINGS: Suspected small bilateral pleural effusions. Overlying bibasilar opacities. No visible pneumothorax. Mild enlargement the cardiac silhouette. No acute osseous abnormality. IMPRESSION: 1. Suspected small bilateral pleural effusions with slow overlying bibasilar atelectasis and/or pneumonia. 2. Mild cardiomegaly. Electronically Signed   By: Margaretha Sheffield M.D.   On: 07/28/2021 12:15   CT ABDOMEN PELVIS W CONTRAST  Result Date:  07/27/2021 CLINICAL DATA:  Pancreatitis, acute, severe EXAM: CT ABDOMEN AND PELVIS WITH CONTRAST TECHNIQUE: Multidetector CT imaging of the abdomen and pelvis was performed using the standard protocol following bolus administration of intravenous contrast. RADIATION DOSE REDUCTION: This exam was performed according to the departmental dose-optimization program which includes automated exposure control, adjustment of the mA and/or kV according to patient size and/or use of iterative reconstruction technique. CONTRAST:  68m OMNIPAQUE IOHEXOL 300 MG/ML  SOLN COMPARISON:  CT abdomen pelvis 07/24/2021 FINDINGS: Lower chest: Bilateral trace volume pleural effusions. Associated bilateral lower lobe passive atelectasis. Hepatobiliary: Pancreas: Previously identified peripancreatic lesion is now heterogeneous and slightly increased in size measuring 6.2 x 3.2 cm (from 5.7 x 2.7 cm). Increased heterogeneity of the lesion  with density of 51 Hounsfield units. Focal main pancreatic duct dilatation measuring up to 5 mm. Spleen: Normal in size without focal abnormality. Adrenals/Urinary Tract: No adrenal nodule bilaterally. Bilateral kidneys enhance symmetrically. No hydronephrosis. No hydroureter. The urinary bladder is unremarkable. Stomach/Bowel: There is a 12 x 9 x 19 cm cystic lesion likely arising from the greater curvature of the stomach. No evidence of bowel wall thickening or dilatation. Appendix appears normal. Vascular/Lymphatic: Marked narrowing of the splenic artery along the posterior pancreas coarse if concern for nonocclusive thrombosis (3:35). No abdominal aorta or iliac aneurysm. Moderate to severe atherosclerotic plaque of the aorta and its branches. No abdominal, pelvic, or inguinal lymphadenopathy. Reproductive: Coarsely calcified uterine fibroids. Otherwise uterus and bilateral adnexa are unremarkable. Other: Trace free fluid within the abdomen and pelvis centered along the pancreas. No intraperitoneal free  gas. No organized fluid collection. Musculoskeletal: Subcutaneus soft tissue edema. No suspicious lytic or blastic osseous lesions. No acute displaced fracture. Multilevel degenerative changes of the spine. IMPRESSION: 1. Bilateral trace volume pleural effusions. 2. Previously identified peripancreatic lesion is now heterogeneous and slightly increased in size measuring 6.2 x 3.2 cm (from 5.7 x 2.7 cm). Interval increase in density within the lesion (50 Hounsfield units) is concerning for underlying hemorrhage. When the patient is clinically stable and able to follow directions and hold their breath (preferably as an outpatient) further evaluation with dedicated abdominal MRI pancreatic protocol should be considered. 3. Marked narrowing of the splenic artery along the posterior pancreas coarse if concern for nonocclusive thrombosis. 4. Trace free fluid within the abdomen and pelvis centered along the pancreas. Pancreatitis is not excluded. Correlate with lipase levels. 5. A 12 x 9 x 19 cm cystic lesion likely arising from the greater curvature of the stomach. Query gist versus pancreatic pseudocyst versus pancreatic neoplasm related to the above findings. These results will be called to the ordering clinician or representative by the Radiologist Assistant, and communication documented in the PACS or [employer]. Electronically Signed   By: Iven Finn M.D.   On: 07/27/2021 16:39   DG CHEST PORT 1 VIEW  Result Date: 07/27/2021 CLINICAL DATA:  Shortness of breath.  Wheezing. EXAM: PORTABLE CHEST 1 VIEW COMPARISON:  07/24/2021 FINDINGS: Mild cardiomegaly shows increase since prior study. New mild diffuse interstitial infiltrates are suspicious for mild interstitial edema. New small bilateral pleural effusions and bibasilar atelectasis also noted. IMPRESSION: Mild acute congestive heart failure with small bilateral pleural effusions. Electronically Signed   By: Marlaine Hind M.D.   On: 07/27/2021 12:00   DG  Abd Portable 1V  Result Date: 07/26/2021 CLINICAL DATA:  Abdominal distension. EXAM: PORTABLE ABDOMEN - 1 VIEW COMPARISON:  10/06/2017.  CT, 07/24/2021. FINDINGS: There is no bowel dilation to suggest obstruction. Soft tissues are poorly defined. No evidence of renal or ureteral stones. IMPRESSION: No acute findings or evidence of bowel obstruction. Electronically Signed   By: Lajean Manes M.D.   On: 07/26/2021 18:07   CT CHEST W CONTRAST  Result Date: 07/24/2021 CLINICAL DATA:  COPD exacerbation. EXAM: CT CHEST WITH CONTRAST TECHNIQUE: Multidetector CT imaging of the chest was performed during intravenous contrast administration. RADIATION DOSE REDUCTION: This exam was performed according to the departmental dose-optimization program which includes automated exposure control, adjustment of the mA and/or kV according to patient size and/or use of iterative reconstruction technique. CONTRAST:  33m OMNIPAQUE IOHEXOL 350 MG/ML SOLN COMPARISON:  CT abdomen and pelvis 07/24/2021 FINDINGS: Cardiovascular: No significant vascular findings. Normal heart size. No pericardial  effusion. Coronary artery calcifications are present. Mediastinum/Nodes: No enlarged mediastinal, hilar, or axillary lymph nodes. Thyroid gland, trachea, and esophagus demonstrate no significant findings. Lungs/Pleura: There is a small amount of patchy atelectasis/airspace disease in the lingula. There are atelectatic changes in both lower lobes. There is no pleural effusion or pneumothorax. Ground-glass nodular density is seen in the left lung apex measuring 1 cm. Trachea and central airways are within normal limits. Upper Abdomen: Cystic lesion in the left upper quadrant is partially imaged. Please see CT abdomen and pelvis performed same day for further description. Musculoskeletal: No chest wall abnormality. No acute or significant osseous findings. IMPRESSION: 1. Minimal lingular atelectasis/airspace disease correlate clinically for  infection. 2. 10 mm left ground-glass pulmonary nodule within the upper lobe. Recommend a non-contrast Chest CT at 6-12 months to confirm persistence, then additional non-contrast Chest CTs every 2 years until 5 years. If nodule grows or develops solid component(s), consider resection. These guidelines do not apply to immunocompromised patients and patients with cancer. Follow up in patients with significant comorbidities as clinically warranted. For lung cancer screening, adhere to Lung-RADS guidelines. Reference: Radiology. 2017; 284(1):228-43. Electronically Signed   By: Ronney Asters M.D.   On: 07/24/2021 23:31   CT ABDOMEN PELVIS W CONTRAST  Result Date: 07/24/2021 CLINICAL DATA:  Abdominal pain, acute.  Black diarrhea. EXAM: CT ABDOMEN AND PELVIS WITH CONTRAST TECHNIQUE: Multidetector CT imaging of the abdomen and pelvis was performed using the standard protocol following bolus administration of intravenous contrast. RADIATION DOSE REDUCTION: This exam was performed according to the departmental dose-optimization program which includes automated exposure control, adjustment of the mA and/or kV according to patient size and/or use of iterative reconstruction technique. CONTRAST:  44m OMNIPAQUE IOHEXOL 300 MG/ML  SOLN COMPARISON:  None Available. FINDINGS: Lower chest: Lung bases are clear. Hepatobiliary: Decreased attenuation of the liver is compatible with steatosis. Normal appearance of the gallbladder. There is a poorly defined low-density structure along the inferior right hepatic lobe adjacent to the gallbladder. This area measures approximately 2.2 cm. Additional small hypodensities throughout the liver. Some of these hypodensities could represent cysts but too small to definitively characterize. Trace perihepatic ascites. Main portal venous system is patent. No biliary dilatation. Normal appearance of the gallbladder. Pancreas: Poorly defined hypodensity in the pancreatic neck region, best seen on  sequence 2 image 30. The pancreatic duct is dilated distal to this area of decreased attenuation in the pancreas. Normal appearance of the pancreatic head and uncinate process. Stranding anterior to the pancreatic body and there is fluid collection with peripheral enhancement around the distal pancreatic body region that measures 2.7 cm in with sequence 2 image 31. Spleen: Normal in size without focal abnormality. Adrenals/Urinary Tract: Normal appearance of the adrenal glands. Normal appearance of the urinary bladder. Normal appearance of both kidneys without stones, hydronephrosis or suspicious renal lesions. Stomach/Bowel: Massive low-density cystic lesion along the posterior aspect of the stomach which is markedly compressing the stomach lumen. This collection measures 23.9 x 9.1 x 10.5 cm. Normal appearance of the duodenum. There is no significant dilatation of the distal esophagus. No small bowel or colonic dilatation. Vascular/Lymphatic: Atherosclerosis in the abdominal aorta without aneurysm or dissection. No significant aortic stenosis. No significant lymph node enlargement in the abdomen or pelvis. Reproductive: Calcified uterine fibroids. No evidence for an adnexal lesion. Other: Small amount of free fluid in the right hemipelvis near the right adnexa. Mild mesenteric edema and indeterminate low-density material along the left side of the abdomen on  sequence 2 image 35 and just anterior to stomach and image number 35. These areas of low-density could represent trace fluid but difficult to exclude subtle peritoneal neoplastic disease. Musculoskeletal: No suspicious osseous findings. IMPRESSION: 1. Focal abnormality in the pancreatic body/neck region with dilatation of the pancreatic duct distal to this area. Findings raise concern for an underlying neoplastic lesion although focal inflammation from pancreatitis is also in the differential diagnosis. There are changes compatible with pancreatitis including  stranding anterior to the pancreas with a fluid collection around the pancreatic body and a massive fluid collection involving the stomach which is causing severe compression of the stomach lumen. These findings could be secondary to pancreatitis with a massive pseudocyst formation but need to exclude a neoplastic process in the pancreatic neck region. Recommend GI consultation. 2. Small amount of fluid and stranding in the abdomen and pelvis which could be associated with pancreatitis. However, some of these areas of peritoneal low density are indeterminate and peritoneal neoplastic disease can not be excluded depending on the etiology of the pancreatic lesion. 3. Hepatic steatosis with scattered hypodensities in the liver. Some of these hypodensities could represent cysts or even areas of focal fat. Subtle hepatic lesions cannot be excluded and could be better characterized with a liver MRI. 4. Uterine fibroids. These results were called by telephone at the time of interpretation on 07/24/2021 at 10:09 am to provider ADAM CURATOLO , who verbally acknowledged these results. Electronically Signed   By: Markus Daft M.D.   On: 07/24/2021 10:13   DG Chest Portable 1 View  Result Date: 07/24/2021 CLINICAL DATA:  Cough EXAM: PORTABLE CHEST 1 VIEW COMPARISON:  Chest radiograph dated October 04, 2017 FINDINGS: The heart size and mediastinal contours are within normal limits. Both lungs are clear without evidence of focal consolidation or pleural effusion. The visualized skeletal structures are unremarkable. IMPRESSION: No active disease. Electronically Signed   By: Keane Police D.O.   On: 07/24/2021 08:49    Labs:  CBC: Recent Labs    07/25/21 0027 07/25/21 0903 07/26/21 0020 07/26/21 0619 07/27/21 0009 07/28/21 0417  WBC 11.0*  --   --  17.5* 13.2* 13.5*  HGB 13.6   < > 12.4 13.7 12.5 12.5  HCT 40.8   < > 37.1 42.0 39.1 41.3  PLT 541*  --   --  591* 569* 511*   < > = values in this interval not  displayed.    COAGS: Recent Labs    07/25/21 0027  INR 1.1    BMP: Recent Labs    07/25/21 0027 07/26/21 0619 07/27/21 0009 07/28/21 0417  NA 137 138 136 139  K 3.9 4.2 3.6 3.8  CL 102 98 96* 101  CO2 '29 30 31 31  '$ GLUCOSE 93 144* 105* 127*  BUN <5* 6* 6* 7*  CALCIUM 8.4* 8.9 8.7* 8.4*  CREATININE 0.51 0.41* 0.41* 0.49  GFRNONAA >60 >60 >60 >60    LIVER FUNCTION TESTS: Recent Labs    07/25/21 0027 07/26/21 0619 07/27/21 0009 07/28/21 0417  BILITOT 0.5 0.4 0.2* 0.6  AST '24 25 29 22  '$ ALT '14 16 14 12  '$ ALKPHOS 102 100 92 92  PROT 4.7* 5.4* 4.8* 4.6*  ALBUMIN 2.4* 2.8* 2.5* 2.4*    Assessment and Plan:  Acute Pancreatitis --peri-pancreatic fluid collection, for aspiration, will try for today --little to no sedation medication to be used. --Significant Other Stan Ward gives informed consent to proceed  Risks and benefits discussed with the  patient's partner including bleeding, infection, damage to adjacent structures, bowel perforation/fistula connection, and sepsis.   Thank you for this interesting consult.  I greatly enjoyed meeting Kathryn Underwood and look forward to participating in their care.  A copy of this report was sent to the requesting provider on this date.  Electronically Signed: Pasty Spillers, PA 07/28/2021, 3:16 PM   I spent a total of 20 Minutes in face to face in clinical consultation, greater than 50% of which was counseling/coordinating care for peri-pancreatic fluid collection.

## 2021-07-29 ENCOUNTER — Inpatient Hospital Stay (HOSPITAL_COMMUNITY): Payer: Self-pay

## 2021-07-29 LAB — BASIC METABOLIC PANEL
Anion gap: 14 (ref 5–15)
BUN: 10 mg/dL (ref 8–23)
CO2: 27 mmol/L (ref 22–32)
Calcium: 8.3 mg/dL — ABNORMAL LOW (ref 8.9–10.3)
Chloride: 99 mmol/L (ref 98–111)
Creatinine, Ser: 0.52 mg/dL (ref 0.44–1.00)
GFR, Estimated: >60 mL/min (ref >60)
Glucose, Bld: 82 mg/dL (ref 70–99)
Potassium: 3.5 mmol/L (ref 3.5–5.1)
Sodium: 140 mmol/L (ref 135–145)

## 2021-07-29 LAB — COMPREHENSIVE METABOLIC PANEL
ALT: 14 U/L (ref 0–44)
AST: 18 U/L (ref 15–41)
Albumin: 2.2 g/dL — ABNORMAL LOW (ref 3.5–5.0)
Alkaline Phosphatase: 73 U/L (ref 38–126)
Anion gap: 4 — ABNORMAL LOW (ref 5–15)
BUN: 9 mg/dL (ref 8–23)
CO2: 32 mmol/L (ref 22–32)
Calcium: 8.1 mg/dL — ABNORMAL LOW (ref 8.9–10.3)
Chloride: 103 mmol/L (ref 98–111)
Creatinine, Ser: 0.41 mg/dL — ABNORMAL LOW (ref 0.44–1.00)
GFR, Estimated: >60 mL/min (ref >60)
Glucose, Bld: 86 mg/dL (ref 70–99)
Potassium: 3.3 mmol/L — ABNORMAL LOW (ref 3.5–5.1)
Sodium: 139 mmol/L (ref 135–145)
Total Bilirubin: 0.5 mg/dL (ref 0.3–1.2)
Total Protein: 4.3 g/dL — ABNORMAL LOW (ref 6.5–8.1)

## 2021-07-29 LAB — MAGNESIUM: Magnesium: 2 mg/dL (ref 1.7–2.4)

## 2021-07-29 LAB — CBC
HCT: 37.8 % (ref 36.0–46.0)
Hemoglobin: 12.4 g/dL (ref 12.0–15.0)
MCH: 36 pg — ABNORMAL HIGH (ref 26.0–34.0)
MCHC: 32.8 g/dL (ref 30.0–36.0)
MCV: 109.9 fL — ABNORMAL HIGH (ref 80.0–100.0)
Platelets: 442 10*3/uL — ABNORMAL HIGH (ref 150–400)
RBC: 3.44 MIL/uL — ABNORMAL LOW (ref 3.87–5.11)
RDW: 13.2 % (ref 11.5–15.5)
WBC: 12.2 10*3/uL — ABNORMAL HIGH (ref 4.0–10.5)
nRBC: 0 % (ref 0.0–0.2)

## 2021-07-29 LAB — GLUCOSE, CAPILLARY: Glucose-Capillary: 86 mg/dL (ref 70–99)

## 2021-07-29 MED ORDER — FUROSEMIDE 10 MG/ML IJ SOLN
20.0000 mg | Freq: Once | INTRAMUSCULAR | Status: DC
Start: 2021-07-30 — End: 2021-07-29

## 2021-07-29 MED ORDER — ACETAMINOPHEN 500 MG PO TABS
1000.0000 mg | ORAL_TABLET | Freq: Three times a day (TID) | ORAL | Status: AC
  Administered 2021-07-29 (×2): 1000 mg via ORAL
  Filled 2021-07-29 (×3): qty 2

## 2021-07-29 MED ORDER — POTASSIUM CHLORIDE 10 MEQ/100ML IV SOLN
10.0000 meq | INTRAVENOUS | Status: DC
  Administered 2021-07-29 (×4): 10 meq via INTRAVENOUS
  Filled 2021-07-29 (×4): qty 100

## 2021-07-29 MED ORDER — MORPHINE SULFATE (PF) 2 MG/ML IV SOLN
2.0000 mg | INTRAVENOUS | Status: DC | PRN
  Administered 2021-08-03 (×2): 2 mg via INTRAVENOUS
  Filled 2021-07-29 (×2): qty 1

## 2021-07-29 MED ORDER — HYDROMORPHONE HCL 1 MG/ML IJ SOLN: 0.5000 mg | INTRAMUSCULAR | Status: DC | PRN

## 2021-07-29 MED ORDER — FOLIC ACID 1 MG PO TABS
1.0000 mg | ORAL_TABLET | Freq: Every day | ORAL | Status: DC
  Administered 2021-07-29 – 2021-08-06 (×7): 1 mg via ORAL
  Filled 2021-07-29 (×8): qty 1

## 2021-07-29 MED ORDER — SENNOSIDES-DOCUSATE SODIUM 8.6-50 MG PO TABS: 1.0000 | ORAL_TABLET | Freq: Two times a day (BID) | ORAL | Status: DC

## 2021-07-29 MED ORDER — THIAMINE HCL 100 MG PO TABS: 100.0000 mg | ORAL_TABLET | Freq: Every day | ORAL | Status: DC

## 2021-07-29 MED ORDER — OXYCODONE HCL 5 MG PO TABS
5.0000 mg | ORAL_TABLET | Freq: Four times a day (QID) | ORAL | Status: DC | PRN
  Administered 2021-08-02 – 2021-08-04 (×4): 5 mg via ORAL
  Filled 2021-07-29 (×5): qty 1

## 2021-07-29 MED ORDER — MAGNESIUM SULFATE 2 GM/50ML IV SOLN
2.0000 g | Freq: Once | INTRAVENOUS | Status: AC
Start: 2021-07-29 — End: 2021-07-29
  Administered 2021-07-29: 2 g via INTRAVENOUS
  Filled 2021-07-29: qty 50

## 2021-07-29 NOTE — Progress Notes (Signed)
Report given to [facility]. Pt transported to 4E via wheelchair by this RN and NT Bonnita Nasuti. All belongings sent with pt to new room.

## 2021-07-29 NOTE — Progress Notes (Signed)
Progress Note Patient: Kathryn Underwood ZMO:[id number] DOB: [birth year] DOA: 07/24/2021  DOS: the patient was seen and examined on 07/29/2021  Brief hospital course: 61 year old female with past medical history of anxiety, depression, GERD, alcohol use drinks 1 wine bottle per day presented with abdominal pain ongoing for 2 weeks. Found to have acute pancreatitis as well as pancreatic cyst. GI consulted.conservative measures initiated. 7/1 diet advanced to clear liquid diet, x-ray abdomen was unremarkable.  Transfer to progressive care unit for acute delirium. 7/2 remains lethargic and drowsy after receiving Ativan.  CT abdomen pelvis shows possibility of pancreatic lesion hemorrhage and worsening of the pseudocyst size as well as possibility of nonocclusive splenic vein thrombosis.  Discussed with GI recommended to continue current management, no indication for anticoagulation especially given risk. 7/3, transferred to ICU for close observation due to excessive sedation and acute hypercarbic respiratory failure. 7/4 transferred out of the ICU.  Appears to have some mucus appearing diarrhea without any blood.  As patient is recovering from ileus holding off on medicines like Imodium. Assessment and Plan: Alcohol induced acute pancreatitis without necrosis or infection Pancreatic pseudocyst Pancreas hemorrhage GI consulted. Patient drinks 1 wine bottle a day. CT scan shows evidence of pancreatitis as well as possible pancreatic lesion. Also evidence of large pseudocyst with compression of the stomach. Currently plan is conservative measures. X-ray abdomen negative for any ileus. Due to ongoing abdominal pain a CT abdomen pelvis with contrast was repeated on 7/2 which shows pancreatic hemorrhage and the lesion along with worsening pseudocyst. Discussed with Dr. Henrene Pastor, recommend to continue current management for now. Continue IV fluids, IV nausea medication.  Monitor. IR was consulted.  Patient  will be undergoing drainage of the pancreatic fluid collection. Appreciate their assistance.  Consent signed.   Alcohol dependence with unspecified alcohol-induced disorder (HCC) Delirium tremens ([place]) Drinks 1 bottle of wine a day. Last drink was 1 day before admission. Currently on CIWA protocol.  Transferred to progressive care unit on 7/2 early in the morning due to severe delirium. Monitor.   Acute hypoxic and hypercarbic respiratory failure Secondary to combination of hypoventilation from abdominal pain and distention as well as delirium and need for IV benzodiazepine and IV pain medication for symptom control. ABG shows pH of 7.2, PCO2 of 91. Consulted PCCM.  Patient transferred to ICU. Not a good candidate for BiPAP therapy given her abdominal distention situation has remained very high risk for aspiration. Received IV Narcan.  Minimize narcotics.   Thrombosis of splenic artery (HCC) For now we will monitor.  No anticoagulation given concern with bleeding in the pancreatic lesion.   Sinus tachycardia Intermittent.  Expected secondary to pancreatitis.  Monitor for now.  Would not initiate rate control medication for therapy unless indicated.Marland Kitchen   COPD (chronic obstructive pulmonary disease) There was some concern for COPD exacerbation, currently ruled out. Patient was started on IV steroids and IV antibiotics.  Procalcitonin level is negative.  Do not think that the patient has any infection.  Discontinue antibiotics discontinue steroid.   Left pulmonary nodule. 10 mm left ground-glass pulmonary nodule within the upper lobe. Recommend a non-contrast Chest CT at 6-12 months to confirm persistence, then additional non-contrast Chest CTs every 2 years until 5 years. If nodule grows or develops solid component(s), consider resection   Uterine fibroid Incidentally seen. Outpatient follow-up.   Depression with anxiety Insomnia On Eszopiclone at home.  Also on Seroquel along with  Remeron, gabapentin.  Currently on hold. Currently on IV Ativan.  Diarrhea. Ileus. Patient had ileus due to pancreatitis.  Currently having multiple bowel movement.  For now I will monitor closely.  Subjective: No nausea no vomiting.  No chills.  Reported to have some diarrhea later in the day.  At the time of my evaluation abdominal pain is well controlled.  Mentation improving.  Physical Exam: Vitals:   07/29/21 1115 07/29/21 1400 07/29/21 1558 07/29/21 1633  BP: 109/71 116/74 124/73   Pulse: 91 (!) 104 (!) 104 (!) 106  Resp: 19 15 (!) 21 19  Temp: 98.3 F (36.8 C)  99.1 F (37.3 C)   TempSrc: Axillary     SpO2: 100% 96% 95% 97%  Weight:       General: Appear in mild distress; no visible Abnormal Neck Mass Or lumps, Conjunctiva normal Cardiovascular: S1 and S2 Present, no Murmur, Respiratory: good respiratory effort, Bilateral Air entry present and faint basal crackles, no wheezes Abdomen: Bowel Sound present, distended,Non tender  Extremities: no Pedal edema Neurology: alert and oriented to place and person  Gait not checked due to patient safety concerns   Data Reviewed: I have Reviewed nursing notes, Vitals, and Lab results since pt's last encounter. Pertinent lab results CBC and CMP I have ordered test including CBC and CMP I have discussed pt's care plan and test results with PCCM and GI as well as IR.   Family Communication: None at bedside  Disposition: Status is: Inpatient Remains inpatient appropriate because: Monitor for improvement in abdominal pain and distention as well as patient requires IR guided drainage of the pseudocyst.  Author: Berle Mull, MD 07/29/2021 7:53 PM  Please look on [email or web address] to find out who is on call.

## 2021-07-29 NOTE — Consult Note (Signed)
NAME:  Kathryn Underwood, MRN:  [id number], DOB:  08-01-1960, LOS: 5 ADMISSION DATE:  07/24/2021, CONSULTATION DATE:  7/3 REFERRING MD: Dr. Posey Pronto, CHIEF COMPLAINT: Altered mental status   History of Present Illness:  61 y/o F who presented to [facility] on 6/29 with report of two weeks of abdominal pain.   She has a hx of ETOH abuse, typically drinking 1 bottle of wine per day. She was admitted per [facility] with concern for acute pancreatitis. ABD XRAY was negative for ileus.  GI was consulted for evaluation. She was placed on CIWA protocol.  The patient received narcotics for abdominal pain.  She became lethargic on 7/2 and was transferred to PCU. CT imaging concerning for pancreatitis, a large pancreatitic cyst with compression on the stomach, possible non-occlusive splenic vein thrombosis.  She was aggressively fluid resuscitated and given antiemetics.  She was initially treated with IV antibiotics and steroids. There were concerns for possible COPD exacerbation.  Imaging revealed a 18m pulmonary nodule. Repeat CT ABD imaging showed pancreatic hemorrhage along the lesion and worsening pseudocyst.  On 7/3, RRT followed up on patient for lethargy.  She initially required 15L O2 but was given narcan and weaned down to 4L.    PCCM consulted for evaluation of lethargy, concern for hypercarbic respiratory failure.   Pertinent  Medical History  ETOH Abuse Depression  Anxiety  Pancreatitis  GERD   Significant Hospital Events: Including procedures, antibiotic start and stop dates in addition to other pertinent events   6/29 Admit with abd pain 7/2 Tx to PCU for lethargy  7/3 PCCM consulted for lethargy 7/4 improved, pain better  Interim History / Subjective:  Pain better  Objective   Blood pressure 110/68, pulse 93, temperature 97.7 F (36.5 C), temperature source Oral, resp. rate 18, weight 73 kg, SpO2 95 %.    FiO2 (%):  [35 %-45 %] 45 %   Intake/Output Summary (Last 24 hours) at 07/29/2021  0954 Last data filed at 07/29/2021 0800 Gross per 24 hour  Intake 2568.43 ml  Output --  Net 2568.43 ml    Filed Weights   07/26/21 0411 07/28/21 1422 07/29/21 0500  Weight: 70.4 kg 72.5 kg 73 kg    Examination: General: adult female, appears older than stated age, lying in bed in NAD HENT: MM pink/dry, anicteric, VM in place  Lungs: mild tachypnea but non-labored at rest, lungs bilaterally distant but clear  Cardiovascular: S1S2 RRR, no m/r/g Abdomen: protuberant, soft, bsx4 active Extremities: warm/dry, no edema Neuro: Awake/alert to voice, speech clear, MAE   Resolved Hospital Problem list     Assessment & Plan:   Acute Pancreatitis, suspected ETOH Etiology without Necrosis  Large Pancreatitic Pseudocyst with Stomach Compression  Narrowing of Splenic Artery, concern for Non-Occlusive Thrombosis  Abdominal Pain  FOBT Positive  Low suspicion for GIB. Repeat CT abd with possible pancreatic hemorrhage and worsening pseudocyst.  -continue pain control  ETOH Abuse  Hx of 1 bottle of wine per day.  -CIWA protocol, not scoring high -minimize sedating as able  -continue thiamine, folate, MVI  COPD without Exacerbation  Tobacco Abuse -supportive care  -PRn albuterol   Acute Respiratory Failure with Hypoxia Suspect in setting of shunt / splinting due to pain, respiratory depression with pain meds  -wean O2 for sats >90%, weaned to [place] this AM -encourage IS, mobilize   Left Pulmonary Nodule  Ground glass 10 mm nodule in the LUL -will need follow up CT in office in pulmonary office to  ensure no change in size  Macrocytosis without Anemia  -monitor   Depression / Anxiety  -supportive care  Best Practice (right click and "Reselect all SmartList Selections" daily)  Diet/type: NPO DVT prophylaxis: SCD GI prophylaxis: PPI Lines: N/A Foley:  N/A Code Status:  full code Last date of multidisciplinary goals of care discussion: per primary   Labs   CBC: Recent Labs   Lab 07/24/21 0827 07/24/21 1703 07/25/21 0027 07/25/21 2637 07/26/21 0020 07/26/21 8588 07/27/21 0009 07/28/21 0417 07/29/21 0249  WBC 11.3* 13.9* 11.0*  --   --  17.5* 13.2* 13.5* 12.2*  NEUTROABS 7.3 9.5*  --   --   --  15.9* 10.1* 10.9*  --   HGB 14.8 15.1* 13.6   < > 12.4 13.7 12.5 12.5 12.4  HCT 44.4 45.0 40.8   < > 37.1 42.0 39.1 41.3 37.8  MCV 103.0* 105.9* 106.3*  --   --  107.7* 108.3* 112.2* 109.9*  PLT 621* 608* 541*  --   --  591* 569* 511* 442*   < > = values in this interval not displayed.     Basic Metabolic Panel: Recent Labs  Lab 07/24/21 1703 07/25/21 0027 07/26/21 0619 07/27/21 0009 07/28/21 0417 07/29/21 0249  NA 138 137 138 136 139 139  K 3.8 3.9 4.2 3.6 3.8 3.3*  CL 100 102 98 96* 101 103  CO2 '28 29 30 31 31 '$ 32  GLUCOSE 111* 93 144* 105* 127* 86  BUN 5* <5* 6* 6* 7* 9  CREATININE 0.54 0.51 0.41* 0.41* 0.49 0.41*  CALCIUM 8.6* 8.4* 8.9 8.7* 8.4* 8.1*  MG 2.0  --  2.0 2.1 1.8  --   PHOS 3.5  --   --   --   --   --     GFR: Estimated Creatinine Clearance: 69.8 mL/min (A) (by C-G formula based on SCr of 0.41 mg/dL (L)). Recent Labs  Lab 07/24/21 1703 07/24/21 2007 07/25/21 0027 07/26/21 5027 07/27/21 0009 07/27/21 0925 07/28/21 0417 07/29/21 0249  PROCALCITON <0.10  --   --   --   --  <0.10  --   --   WBC 13.9*  --    < > 17.5* 13.2*  --  13.5* 12.2*  LATICACIDVEN 1.2 1.2  --   --   --  0.7  --   --    < > = values in this interval not displayed.     Liver Function Tests: Recent Labs  Lab 07/25/21 0027 07/26/21 0619 07/27/21 0009 07/28/21 0417 07/29/21 0249  AST '24 25 29 22 18  '$ ALT '14 16 14 12 14  '$ ALKPHOS 102 100 92 92 73  BILITOT 0.5 0.4 0.2* 0.6 0.5  PROT 4.7* 5.4* 4.8* 4.6* 4.3*  ALBUMIN 2.4* 2.8* 2.5* 2.4* 2.2*    Recent Labs  Lab 07/24/21 0827 07/26/21 0619  LIPASE 109* 129*    No results for input(s): "AMMONIA" in the last 168 hours.  ABG    Component Value Date/Time   PHART 7.24 (L) 07/28/2021 1132    PCO2ART 91 (HH) 07/28/2021 1132   PO2ART 193 (H) 07/28/2021 1132   HCO3 39.0 (H) 07/28/2021 1132   TCO2 25 10/03/2017 2354   ACIDBASEDEF 1.0 10/03/2017 2354   O2SAT 99.4 07/28/2021 1132     Coagulation Profile: Recent Labs  Lab 07/25/21 0027  INR 1.1     Cardiac Enzymes: No results for input(s): "CKTOTAL", "CKMB", "CKMBINDEX", "TROPONINI" in the last 168 hours.  HbA1C:  Hgb A1c MFr Bld  Date/Time Value Ref Range Status  12/09/2015 03:45 PM 5.5 4.6 - 6.5 % Final    Comment:    Glycemic Control Guidelines for People with Diabetes:Non Diabetic:  <6%Goal of Therapy: <7%Additional Action Suggested:  >8%     CBG: Recent Labs  Lab 07/28/21 1419 07/28/21 1536 07/28/21 1906 07/28/21 2300 07/29/21 0302  GLUCAP 101* 93 103* 77 86     Review of Systems:   N/a  Past Medical History:  She,  has a past medical history of Alcohol abuse, Anxiety, Chickenpox, Depression, Neuromuscular disorder ([place]), and Substance abuse ([place]).   Surgical History:   Past Surgical History:  Procedure Laterality Date   CESAREAN SECTION     TONSILLECTOMY       Social History:   reports that she has been smoking cigarettes. She has been smoking an average of .5 packs per day. She has never used smokeless tobacco. She reports current alcohol use of about 16.0 standard drinks of alcohol per week. She reports current drug use. Drugs: Cocaine and Marijuana.   Family History:  Her family history includes Arthritis in her father; Asthma in her brother and father; Dementia in her maternal grandmother and mother; Diabetes in her maternal grandmother and paternal grandmother; Parkinson's disease in her maternal grandfather and paternal grandfather. There is no history of Breast cancer or Colon cancer.   Allergies No Known Allergies   Home Medications  Prior to Admission medications   Medication Sig Start Date End Date Taking? Authorizing Provider  Eszopiclone 3 MG TABS Take 3 mg by mouth at bedtime as  needed for sleep. Take immediately before bedtime   Yes [provider]  gabapentin (NEURONTIN) 300 MG capsule Take 2 capsules (600 mg total) by mouth 2 (two) times daily. 10/09/17  Yes Malvin Johns, MD  mirtazapine (REMERON) 45 MG tablet Take 45 mg by mouth at bedtime. 07/17/21  Yes [provider]  QUEtiapine (SEROQUEL) 50 MG tablet Take 1 tablet by mouth at bedtime. 07/17/21  Yes [provider]  chlordiazePOXIDE (LIBRIUM) 25 MG capsule '50mg'$  PO TID x 1D, then 25-'50mg'$  PO BID X 1D, then 25-'50mg'$  PO QD X 1D Patient not taking: Reported on 07/23/2021 03/24/18   Isla Pence, MD  cyclobenzaprine (FLEXERIL) 10 MG tablet Take 1 tablet (10 mg total) by mouth 3 (three) times daily as needed for muscle spasms. Patient not taking: Reported on 07/23/2021 10/09/17   Malvin Johns, MD  folic acid (FOLVITE) 1 MG tablet Take 1 tablet (1 mg total) by mouth daily. Patient not taking: Reported on 07/23/2021 04/29/16   Raiford Noble Latif, DO  Multiple Vitamin (MULTIVITAMIN WITH MINERALS) TABS tablet Take 1 tablet by mouth daily. Patient not taking: Reported on 07/24/2021 10/09/17   Georgette Shell, MD  nicotine (NICODERM CQ - DOSED IN MG/24 HOURS) 21 mg/24hr patch Place 1 patch (21 mg total) onto the skin daily. Patient not taking: Reported on 07/23/2021 10/09/17   Georgette Shell, MD  pantoprazole (PROTONIX) 20 MG tablet Take 1 tablet (20 mg total) by mouth daily. Patient not taking: Reported on 07/23/2021 09/20/18   Caccavale, Sophia, PA-C  potassium chloride SA (K-DUR,KLOR-CON) 20 MEQ tablet Take 1 tablet (20 mEq total) by mouth daily. Patient not taking: Reported on 07/23/2021 10/09/17   Malvin Johns, MD  sucralfate (CARAFATE) 1 GM/10ML suspension Take 10 mLs (1 g total) by mouth 4 (four) times daily -  with meals and at bedtime. Patient not taking: Reported on 07/23/2021  09/20/18   Caccavale, Sophia, PA-C  thiamine 100 MG tablet Take 1 tablet (100 mg total) by mouth daily. Patient  not taking: Reported on 07/23/2021 10/09/17   Georgette Shell, MD     Critical care time: n/a       Lanier Clam, MD [facility] 07/29/2021, 9:54 AM   Please see [email or web address] for contact details.   From 7A-7P if no response, please call [phone] After hours, please call ELink [phone]

## 2021-07-29 NOTE — Progress Notes (Signed)
Physician made aware of patient having bowel movements.

## 2021-07-29 NOTE — Plan of Care (Signed)
  Problem: Clinical Measurements: Goal: Respiratory complications will improve Outcome: Progressing   Problem: Activity: Goal: Risk for activity intolerance will decrease Outcome: Progressing   Problem: Pain Managment: Goal: General experience of comfort will improve Outcome: Progressing   

## 2021-07-29 NOTE — Progress Notes (Addendum)
Daily Rounding Note  07/29/2021, 8:24 AM  LOS: 5 days   SUBJECTIVE:   Chief complaint: Acute alcohol induced pancreatitis.     Pt obtunded and respiratory/mental status precarious in setting of Ativan, opiates.  Transferred to ICU 52M for airway watch.  CCM discontinued narcotics but can continuing CIWA/Ativan protocol.    OBJECTIVE:         Vital signs in last 24 hours:    Temp:  [97.6 F (36.4 C)-98.8 F (37.1 C)] 97.7 F (36.5 C) (07/04 0700) Pulse Rate:  [90-124] 93 (07/04 0700) Resp:  [13-27] 18 (07/04 0700) BP: (95-130)/(53-108) 110/68 (07/04 0700) SpO2:  [81 %-100 %] 95 % (07/04 0700) FiO2 (%):  [35 %-45 %] 45 % (07/04 0400) Weight:  [72.5 kg-73 kg] 73 kg (07/04 0500) Last BM Date : 07/25/21 Filed Weights   07/26/21 0411 07/28/21 1422 07/29/21 0500  Weight: 70.4 kg 72.5 kg 73 kg   General: Alert, looks comfortable but requesting to leave the hospital Heart: RRR. Chest: No labored breathing or cough. Abdomen: Moderately tense, not tender.  Bowel sounds present. Extremities: Feet are warm.  No lower extremity edema. Neuro/Psych: Oriented to place, time, self, somewhat to situation.  Fluid speech.  Moves all 4 limbs.  No involuntary movement.  Obtundation/somnolence noticed yesterday has resolved.  Intake/Output from previous day: 07/03 0701 - 07/04 0700 In: 2767.6 [I.V.:2467.6; IV Piggyback:300] Out: -   Intake/Output this shift: No intake/output data recorded.  Lab Results: Recent Labs    07/27/21 0009 07/28/21 0417 07/29/21 0249  WBC 13.2* 13.5* 12.2*  HGB 12.5 12.5 12.4  HCT 39.1 41.3 37.8  PLT 569* 511* 442*   BMET Recent Labs    07/27/21 0009 07/28/21 0417 07/29/21 0249  NA 136 139 139  K 3.6 3.8 3.3*  CL 96* 101 103  CO2 31 31 32  GLUCOSE 105* 127* 86  BUN 6* 7* 9  CREATININE 0.41* 0.49 0.41*  CALCIUM 8.7* 8.4* 8.1*   LFT Recent Labs    07/27/21 0009 07/28/21 0417  07/29/21 0249  PROT 4.8* 4.6* 4.3*  ALBUMIN 2.5* 2.4* 2.2*  AST '29 22 18  '$ ALT '14 12 14  '$ ALKPHOS 92 92 73  BILITOT 0.2* 0.6 0.5   PT/INR No results for input(s): "LABPROT", "INR" in the last 72 hours. Hepatitis Panel No results for input(s): "HEPBSAG", "HCVAB", "HEPAIGM", "HEPBIGM" in the last 72 hours.  Studies/Results: DG Chest Port 1 View  Result Date: 07/28/2021 CLINICAL DATA:  Acute respiratory distress. EXAM: PORTABLE CHEST 1 VIEW COMPARISON:  Chest radiograph July 27, 2021. FINDINGS: Suspected small bilateral pleural effusions. Overlying bibasilar opacities. No visible pneumothorax. Mild enlargement the cardiac silhouette. No acute osseous abnormality. IMPRESSION: 1. Suspected small bilateral pleural effusions with slow overlying bibasilar atelectasis and/or pneumonia. 2. Mild cardiomegaly. Electronically Signed   By: Margaretha Sheffield M.D.   On: 07/28/2021 12:15   CT ABDOMEN PELVIS W CONTRAST  Result Date: 07/27/2021 CLINICAL DATA:  Pancreatitis, acute, severe EXAM: CT ABDOMEN AND PELVIS WITH CONTRAST TECHNIQUE: Multidetector CT imaging of the abdomen and pelvis was performed using the standard protocol following bolus administration of intravenous contrast. RADIATION DOSE REDUCTION: This exam was performed according to the departmental dose-optimization program which includes automated exposure control, adjustment of the mA and/or kV according to patient size and/or use of iterative reconstruction technique. CONTRAST:  68m OMNIPAQUE IOHEXOL 300 MG/ML  SOLN COMPARISON:  CT abdomen pelvis 07/24/2021 FINDINGS: Lower chest: Bilateral trace  volume pleural effusions. Associated bilateral lower lobe passive atelectasis. Hepatobiliary: Pancreas: Previously identified peripancreatic lesion is now heterogeneous and slightly increased in size measuring 6.2 x 3.2 cm (from 5.7 x 2.7 cm). Increased heterogeneity of the lesion with density of 51 Hounsfield units. Focal main pancreatic duct dilatation  measuring up to 5 mm. Spleen: Normal in size without focal abnormality. Adrenals/Urinary Tract: No adrenal nodule bilaterally. Bilateral kidneys enhance symmetrically. No hydronephrosis. No hydroureter. The urinary bladder is unremarkable. Stomach/Bowel: There is a 12 x 9 x 19 cm cystic lesion likely arising from the greater curvature of the stomach. No evidence of bowel wall thickening or dilatation. Appendix appears normal. Vascular/Lymphatic: Marked narrowing of the splenic artery along the posterior pancreas coarse if concern for nonocclusive thrombosis (3:35). No abdominal aorta or iliac aneurysm. Moderate to severe atherosclerotic plaque of the aorta and its branches. No abdominal, pelvic, or inguinal lymphadenopathy. Reproductive: Coarsely calcified uterine fibroids. Otherwise uterus and bilateral adnexa are unremarkable. Other: Trace free fluid within the abdomen and pelvis centered along the pancreas. No intraperitoneal free gas. No organized fluid collection. Musculoskeletal: Subcutaneus soft tissue edema. No suspicious lytic or blastic osseous lesions. No acute displaced fracture. Multilevel degenerative changes of the spine. IMPRESSION: 1. Bilateral trace volume pleural effusions. 2. Previously identified peripancreatic lesion is now heterogeneous and slightly increased in size measuring 6.2 x 3.2 cm (from 5.7 x 2.7 cm). Interval increase in density within the lesion (50 Hounsfield units) is concerning for underlying hemorrhage. When the patient is clinically stable and able to follow directions and hold their breath (preferably as an outpatient) further evaluation with dedicated abdominal MRI pancreatic protocol should be considered. 3. Marked narrowing of the splenic artery along the posterior pancreas coarse if concern for nonocclusive thrombosis. 4. Trace free fluid within the abdomen and pelvis centered along the pancreas. Pancreatitis is not excluded. Correlate with lipase levels. 5. A 12 x 9 x 19  cm cystic lesion likely arising from the greater curvature of the stomach. Query gist versus pancreatic pseudocyst versus pancreatic neoplasm related to the above findings. These results will be called to the ordering clinician or representative by the Radiologist Assistant, and communication documented in the PACS or [employer]. Electronically Signed   By: Iven Finn M.D.   On: 07/27/2021 16:39   DG CHEST PORT 1 VIEW  Result Date: 07/27/2021 CLINICAL DATA:  Shortness of breath.  Wheezing. EXAM: PORTABLE CHEST 1 VIEW COMPARISON:  07/24/2021 FINDINGS: Mild cardiomegaly shows increase since prior study. New mild diffuse interstitial infiltrates are suspicious for mild interstitial edema. New small bilateral pleural effusions and bibasilar atelectasis also noted. IMPRESSION: Mild acute congestive heart failure with small bilateral pleural effusions. Electronically Signed   By: Marlaine Hind M.D.   On: 07/27/2021 12:00     Scheduled Meds:  Chlorhexidine Gluconate Cloth  6 each Topical Daily   folic acid  1 mg Intravenous Daily   LORazepam  0-4 mg Intravenous Q8H   mouth rinse  15 mL Mouth Rinse 4 times per day   pantoprazole (PROTONIX) IV  40 mg Intravenous Q12H   thiamine  100 mg Intravenous Daily   Continuous Infusions:  acetaminophen Stopped (07/29/21 0635)   lactated ringers 100 mL/hr at 07/29/21 0700   magnesium sulfate bolus IVPB 2 g (07/29/21 0825)   potassium chloride 10 mEq (07/29/21 0821)   PRN Meds:.albuterol, HYDROmorphone (DILAUDID) injection, LORazepam **OR** LORazepam, naloxone, ondansetron **OR** ondansetron (ZOFRAN) IV, mouth rinse, mouth rinse  ASSESMENT:   Acute, likely alcohol  induced, pancreatitis. Large pancreatic fluid collections/evolving pseudocysts, 1 of these compressing the stomach. 7/2 CTAP w contrast: Slight increase in now heterogeneous pancreatic lesion measures 6.2 x 3.2 cm, up from 5.7 x 2.7 cm.  Increase density of lesion concerning for underlying  hemorrhage. Recommend pancreatic protocol MRI when pt able to cooperate.  Narrowing of splenic artery in posterior pancreas concerning for nonocclusive thrombosis.  Trace abdominal and pelvic free fluid.  Pancreatitis not excluded.  12 x 9 x 19 cm cystic lesion arising from greater curvature of stomach, GIST vs pseudocyst vs pancreatic neoplasm. WBCs 17.5..  12.2.  No abx in place.    AMS.  In setting of narcotics, Ativan, ETOH withdrawal.  Now in ICU for airway watch.  CCM plan was dc narcotics.  However has as needed Dilaudid available.  Encounter today is encouraging, AMS significantly improved.    Fatty liver, scattered hepatic hypodensity possibly representing cysts or focal fat seen on CT.    Hypokalemia.  K 3.3.  No decline in renal function.   PLAN     Cyst aspiration today?  Pt looks stable, safe to go to IR.   Suspect she will transfer out of ICU today.    Azucena Freed  07/29/2021, 8:24 AM Phone [place] Attending   I have taken an interval history, reviewed the chart and examined the patient. I agree with the Advanced Practitioner's note, impression and recommendations.    Additional comments:  Resp status improved significantly For aspiration of pancreatic fluid collection at some point - may be consentable now On clears - be cautious - is aspiration risk  Will follow  Gatha Mayer, MD, [facility] See Shea Evans on call - gastroenterology for best contact person 07/29/2021 10:56 AM

## 2021-07-30 ENCOUNTER — Encounter (HOSPITAL_COMMUNITY): Payer: Self-pay | Admitting: Internal Medicine

## 2021-07-30 ENCOUNTER — Inpatient Hospital Stay (HOSPITAL_COMMUNITY): Payer: Self-pay

## 2021-07-30 DIAGNOSIS — J449 Chronic obstructive pulmonary disease, unspecified: Secondary | ICD-10-CM

## 2021-07-30 DIAGNOSIS — F10931 Alcohol use, unspecified with withdrawal delirium: Secondary | ICD-10-CM

## 2021-07-30 LAB — COMPREHENSIVE METABOLIC PANEL
ALT: 13 U/L (ref 0–44)
AST: 19 U/L (ref 15–41)
Albumin: 2.2 g/dL — ABNORMAL LOW (ref 3.5–5.0)
Alkaline Phosphatase: 80 U/L (ref 38–126)
Anion gap: 13 (ref 5–15)
BUN: 7 mg/dL — ABNORMAL LOW (ref 8–23)
CO2: 26 mmol/L (ref 22–32)
Calcium: 8.1 mg/dL — ABNORMAL LOW (ref 8.9–10.3)
Chloride: 101 mmol/L (ref 98–111)
Creatinine, Ser: 0.5 mg/dL (ref 0.44–1.00)
GFR, Estimated: >60 mL/min (ref >60)
Glucose, Bld: 77 mg/dL (ref 70–99)
Potassium: 3.1 mmol/L — ABNORMAL LOW (ref 3.5–5.1)
Sodium: 140 mmol/L (ref 135–145)
Total Bilirubin: 1.2 mg/dL (ref 0.3–1.2)
Total Protein: 4.4 g/dL — ABNORMAL LOW (ref 6.5–8.1)

## 2021-07-30 LAB — CBC WITH DIFFERENTIAL/PLATELET
Abs Immature Granulocytes: 0.08 10*3/uL — ABNORMAL HIGH (ref 0.00–0.07)
Basophils Absolute: 0.1 10*3/uL (ref 0.0–0.1)
Basophils Relative: 1 %
Eosinophils Absolute: 0.2 10*3/uL (ref 0.0–0.5)
Eosinophils Relative: 1 %
HCT: 39.8 % (ref 36.0–46.0)
Hemoglobin: 12.9 g/dL (ref 12.0–15.0)
Immature Granulocytes: 1 %
Lymphocytes Relative: 7 %
Lymphs Abs: 1 10*3/uL (ref 0.7–4.0)
MCH: 35.1 pg — ABNORMAL HIGH (ref 26.0–34.0)
MCHC: 32.4 g/dL (ref 30.0–36.0)
MCV: 108.2 fL — ABNORMAL HIGH (ref 80.0–100.0)
Monocytes Absolute: 1 10*3/uL (ref 0.1–1.0)
Monocytes Relative: 7 %
Neutro Abs: 12 10*3/uL — ABNORMAL HIGH (ref 1.7–7.7)
Neutrophils Relative %: 83 %
Platelets: 477 10*3/uL — ABNORMAL HIGH (ref 150–400)
RBC: 3.68 MIL/uL — ABNORMAL LOW (ref 3.87–5.11)
RDW: 13 % (ref 11.5–15.5)
WBC: 14.4 10*3/uL — ABNORMAL HIGH (ref 4.0–10.5)
nRBC: 0 % (ref 0.0–0.2)

## 2021-07-30 LAB — GLUCOSE, CAPILLARY: Glucose-Capillary: 88 mg/dL (ref 70–99)

## 2021-07-30 LAB — MAGNESIUM: Magnesium: 1.8 mg/dL (ref 1.7–2.4)

## 2021-07-30 MED ORDER — LORAZEPAM 2 MG/ML IJ SOLN
1.0000 mg | INTRAMUSCULAR | Status: AC | PRN
  Administered 2021-07-30 (×2): 3 mg via INTRAVENOUS
  Administered 2021-07-30: 4 mg via INTRAVENOUS
  Administered 2021-07-30: 1 mg via INTRAVENOUS
  Administered 2021-07-30: 3 mg via INTRAVENOUS
  Administered 2021-07-31: 2 mg via INTRAVENOUS
  Administered 2021-07-31 (×2): 3 mg via INTRAVENOUS
  Filled 2021-07-30 (×5): qty 2
  Filled 2021-07-30: qty 1
  Filled 2021-07-30: qty 2
  Filled 2021-07-30: qty 1

## 2021-07-30 MED ORDER — POTASSIUM CHLORIDE 10 MEQ/100ML IV SOLN
10.0000 meq | INTRAVENOUS | Status: AC
  Administered 2021-07-30 (×3): 10 meq via INTRAVENOUS
  Filled 2021-07-30 (×3): qty 100

## 2021-07-30 MED ORDER — LORAZEPAM 1 MG PO TABS: 1.0000 mg | ORAL_TABLET | ORAL | Status: AC | PRN

## 2021-07-30 MED ORDER — PANTOPRAZOLE SODIUM 40 MG PO TBEC
40.0000 mg | DELAYED_RELEASE_TABLET | Freq: Every day | ORAL | Status: DC
Start: 2021-07-31 — End: 2021-08-06
  Administered 2021-08-01 – 2021-08-06 (×6): 40 mg via ORAL
  Filled 2021-07-30 (×6): qty 1

## 2021-07-30 MED ORDER — THIAMINE HCL 100 MG/ML IJ SOLN
100.0000 mg | Freq: Every day | INTRAMUSCULAR | Status: DC
  Administered 2021-07-30 – 2021-08-02 (×4): 100 mg via INTRAVENOUS
  Filled 2021-07-30 (×4): qty 2

## 2021-07-30 MED ORDER — POTASSIUM CHLORIDE IN NACL 20-0.45 MEQ/L-% IV SOLN
INTRAVENOUS | Status: DC
  Filled 2021-07-30 (×3): qty 1000

## 2021-07-30 MED ORDER — HALOPERIDOL LACTATE 5 MG/ML IJ SOLN
2.0000 mg | Freq: Four times a day (QID) | INTRAMUSCULAR | Status: DC | PRN
Start: 2021-07-30 — End: 2021-08-06
  Administered 2021-07-31: 2 mg via INTRAVENOUS
  Filled 2021-07-30: qty 1

## 2021-07-30 NOTE — Progress Notes (Signed)
Attempted to bring this pt down for her aspiration. Per nurse pt is very agitated and combative. Pt has not been set up for sedation. We will reevaluate

## 2021-07-30 NOTE — Progress Notes (Addendum)
Patient attempting to get out of the bed. Patient yelling at RN to "Get out of room". Patient refusing to keep nasal cannula on. Patient confused and combative with nursing staff. Paged MD to obtain safety observation/ restraints. Patient refused all medications and RN educated but patient continued to yell at RN to "Leave her alone". Patient refused RN to complete assessment. Bed alarm on and floor mats in place. Will continue to monitor.  [place] C Dayvian Blixt

## 2021-07-30 NOTE — Progress Notes (Signed)
[facility] PROGRESS NOTE   Kathryn Underwood NID:[id number] DOB: 1961/01/08 DOA: 07/24/2021  PCP: Fredirick Lathe, PA-C  Brief History/Interval Summary: 61 year old female with past medical history of anxiety, depression, GERD, alcohol use drinks 1 wine bottle per day presented with abdominal pain ongoing for 2 weeks. Found to have acute pancreatitis as well as pancreatic cyst.  To be transferred to the ICU due to oversedation.  Subsequently transferred back out.   Consultants: Gastroenterology  Procedures: None   Subjective/Interval History: Patient noted to be somewhat confused this morning.  Follows commands.  Subsequently noted to be agitated requiring restraints.    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  Patient seems to be stable for the most part.  Abdominal CT scan was repeated on 7/2 which showed evidence of pancreatic hemorrhage and lesion along with worsening cirrhosis. Interventional radiology was consulted.  Plan is for drainage of the pancreatic fluid collection at some point in time.  Acute metabolic encephalopathy Likely due to delirium from alcohol withdrawal.  No obvious neurological deficits noted.  Patient has been on CIWA protocol.  Restraints as needed.  Alcohol dependence Has been counseled.  See above.  On CIWA protocol.  Continue thiamine.  Acute hypoxic and hypercapnic respiratory failure Likely due to combination of hypoventilation from abdominal pain along with delirium from alcohol withdrawal and intravenous agents including benzodiazepines and pain medications. Patient was transferred to the ICU.  Monitor closely.  Did not require intubation.  Received IV Narcan.  Transferred back up to the floor.  Currently noted to be on oxygen at 4 L/min.  Monitor saturations and mentation closely.  Thrombosis of splenic artery We will continue to monitor.  Likely related to  pancreatitis.  No anticoagulation due to concern for pancreatic hemorrhage.  Hypokalemia Potassium will be supplemented.  Magnesium 1.8.  History of COPD Stable.  Left pulmonary nodule Will require outpatient surveillance.  Uterine fibroid Incidentally noted.  Outpatient follow-up.  History of depression and anxiety Home medications including Seroquel Remeron gabapentin and eszopiclone on hold.  DVT Prophylaxis: SCDs.  No anticoagulation due to pancreatic hemorrhage Code Status: Full code Family Communication: Discussed with patient.  No family at bedside Disposition Plan: Hopefully return home in improved  Status is: Inpatient Remains inpatient appropriate because: Delirium, pancreatitis    Medications: Scheduled:  acetaminophen  1,000 mg Oral TID   Chlorhexidine Gluconate Cloth  6 each Topical Daily   folic acid  1 mg Oral Daily   mouth rinse  15 mL Mouth Rinse 4 times per day   pantoprazole (PROTONIX) IV  40 mg Intravenous Q12H   thiamine  100 mg Oral Daily   Continuous:  lactated ringers 50 mL/hr at 07/29/21 1001   RWE:[id number], LORazepam **OR** LORazepam, morphine injection, naloxone, ondansetron **OR** ondansetron (ZOFRAN) IV, mouth rinse, mouth rinse, oxyCODONE  Antibiotics: Anti-infectives (From admission, onward)    Start     Dose/Rate Route Frequency Ordered Stop   07/24/21 2230  cefTRIAXone (ROCEPHIN) 1 g in sodium chloride 0.9 % 100 mL IVPB  Status:  Discontinued        1 g 200 mL/hr over 30 Minutes Intravenous Every 24 hours 07/24/21 2130 07/27/21 0729   07/24/21 2230  azithromycin (ZITHROMAX) 500 mg in sodium chloride 0.9 % 250 mL IVPB  Status:  Discontinued        500 mg 250 mL/hr over 60 Minutes Intravenous Every 24 hours 07/24/21 2130 07/26/21 1415       Objective:  Vital Signs  Vitals:   07/30/21 0324 07/30/21 0926 07/30/21 0927 07/30/21 1102  BP: 105/71 127/78  96/61  Pulse: 94 (!) 107 (!) 106 98  Resp: 20 (!) 22 17   Temp:  97.7 F  (36.5 C)    TempSrc:  Oral    SpO2: 100% 96% 96%   Weight:        Intake/Output Summary (Last 24 hours) at 07/30/2021 1122 Last data filed at 07/30/2021 0900 Gross per 24 hour  Intake 1037.21 ml  Output 650 ml  Net 387.21 ml   Filed Weights   07/26/21 0411 07/28/21 1422 07/29/21 0500  Weight: 70.4 kg 72.5 kg 73 kg    General appearance: Somnolent but arousable.  Confused. Resp: Clear to auscultation bilaterally.  Normal effort Cardio: S1-S2 is normal regular.  No S3-S4.  No rubs murmurs or bruit GI: Abdomen is soft.  Nontender nondistended.  Bowel sounds are present normal.  No masses organomegaly Extremities: No edema.  Full range of motion of lower extremities. Neurologic:  No focal neurological deficits.    Lab Results:  Data Reviewed: I have personally reviewed following labs and reports of the imaging studies  CBC: Recent Labs  Lab 07/24/21 1703 07/25/21 0027 07/26/21 0258 07/27/21 0009 07/28/21 0417 07/29/21 0249 07/30/21 0148  WBC 13.9*   < > 17.5* 13.2* 13.5* 12.2* 14.4*  NEUTROABS 9.5*  --  15.9* 10.1* 10.9*  --  12.0*  HGB 15.1*   < > 13.7 12.5 12.5 12.4 12.9  HCT 45.0   < > 42.0 39.1 41.3 37.8 39.8  MCV 105.9*   < > 107.7* 108.3* 112.2* 109.9* 108.2*  PLT 608*   < > 591* 569* 511* 442* 477*   < > = values in this interval not displayed.    Basic Metabolic Panel: Recent Labs  Lab 07/24/21 1703 07/25/21 0027 07/26/21 5277 07/27/21 0009 07/28/21 0417 07/29/21 0249 07/29/21 1857 07/30/21 0148  NA 138   < > 138 136 139 139 140 140  K 3.8   < > 4.2 3.6 3.8 3.3* 3.5 3.1*  CL 100   < > 98 96* 101 103 99 101  CO2 28   < > '30 31 31 '$ 32 27 26  GLUCOSE 111*   < > 144* 105* 127* 86 82 77  BUN 5*   < > 6* 6* 7* 9 10 7*  CREATININE 0.54   < > 0.41* 0.41* 0.49 0.41* 0.52 0.50  CALCIUM 8.6*   < > 8.9 8.7* 8.4* 8.1* 8.3* 8.1*  MG 2.0  --  2.0 2.1 1.8  --  2.0 1.8  PHOS 3.5  --   --   --   --   --   --   --    < > = values in this interval not displayed.     GFR: Estimated Creatinine Clearance: 69.8 mL/min (by C-G formula based on SCr of 0.5 mg/dL).  Liver Function Tests: Recent Labs  Lab 07/26/21 0619 07/27/21 0009 07/28/21 0417 07/29/21 0249 07/30/21 0148  AST '25 29 22 18 19  '$ ALT '16 14 12 14 13  '$ ALKPHOS 100 92 92 73 80  BILITOT 0.4 0.2* 0.6 0.5 1.2  PROT 5.4* 4.8* 4.6* 4.3* 4.4*  ALBUMIN 2.8* 2.5* 2.4* 2.2* 2.2*    Recent Labs  Lab 07/24/21 0827 07/26/21 0619  LIPASE 109* 129*    Coagulation Profile: Recent Labs  Lab 07/25/21 0027  INR 1.1    CBG: Recent Labs  Lab 07/28/21 1536 07/28/21 1906 07/28/21 2300 07/29/21 0302 07/29/21 0743  GLUCAP 93 103* 77 86 88     Recent Results (from the past 240 hour(s))  SARS Coronavirus 2 by RT PCR (hospital order, performed in [facility] hospital lab) *cepheid single result test* Anterior Nasal Swab     Status: None   Collection Time: 07/24/21  9:05 AM   Specimen: Anterior Nasal Swab  Result Value Ref Range Status   SARS Coronavirus 2 by RT PCR NEGATIVE NEGATIVE Final    Comment: (NOTE) SARS-CoV-2 target nucleic acids are NOT DETECTED.  The SARS-CoV-2 RNA is generally detectable in upper and lower respiratory specimens during the acute phase of infection. The lowest concentration of SARS-CoV-2 viral copies this assay can detect is 250 copies / mL. A negative result does not preclude SARS-CoV-2 infection and should not be used as the sole basis for treatment or other patient management decisions.  A negative result may occur with improper specimen collection / handling, submission of specimen other than nasopharyngeal swab, presence of viral mutation(s) within the areas targeted by this assay, and inadequate number of viral copies (<250 copies / mL). A negative result must be combined with clinical observations, patient history, and epidemiological information.  Fact Sheet for Patients:   https://www.patel.info/  Fact Sheet for Healthcare  Providers: https://hall.com/  This test is not yet approved or  cleared by the [place] FDA and has been authorized for detection and/or diagnosis of SARS-CoV-2 by FDA under an Emergency Use Authorization (EUA).  This EUA will remain in effect (meaning this test can be used) for the duration of the COVID-19 declaration under Section 564(b)(1) of the Act, 21 U.S.C. section 360bbb-3(b)(1), unless the authorization is terminated or revoked sooner.  Performed at [facility], [address], [place], [place] [address]   MRSA Next Gen by PCR, Nasal     Status: None   Collection Time: 07/28/21  2:25 PM   Specimen: Nasal Mucosa; Nasal Swab  Result Value Ref Range Status   MRSA by PCR Next Gen NOT DETECTED NOT DETECTED Final    Comment: (NOTE) The GeneXpert MRSA Assay (FDA approved for NASAL specimens only), is one component of a comprehensive MRSA colonization surveillance program. It is not intended to diagnose MRSA infection nor to guide or monitor treatment for MRSA infections. Test performance is not FDA approved in patients less than 9 years old. Performed at [facility], [place] [address] [place], [place] [address]       Radiology Studies: DG Abd Portable 1V  Result Date: 07/29/2021 CLINICAL DATA:  Abdominal pain and distension. EXAM: PORTABLE ABDOMEN - 1 VIEW COMPARISON:  07/26/2021 radiograph and prior studies FINDINGS: The bowel gas pattern is normal.  No radio-opaque calculi are noted. Pelvic calcifications are compatible with small calcified fibroids. No acute bony abnormality noted. IMPRESSION: No acute abnormality. Unremarkable bowel gas pattern. Electronically Signed   By: Margarette Canada M.D.   On: 07/29/2021 18:30   DG CHEST PORT 1 VIEW  Result Date: 07/29/2021 CLINICAL DATA:  Acute respiratory failure EXAM: PORTABLE CHEST 1 VIEW COMPARISON:  07/28/2021 FINDINGS: 0508 hours. The cardio pericardial silhouette is enlarged.  There is pulmonary vascular congestion without overt pulmonary edema. Improvement in aeration at the lung bases with persistent left base collapse/consolidation and tiny left pleural effusion. Telemetry leads overlie the chest. IMPRESSION: 1. Interval improvement in aeration at the lung bases. 2. Pulmonary vascular congestion without overt pulmonary edema. 3. Persistent left base  collapse/consolidation and tiny left pleural effusion. Electronically Signed   By: Misty Stanley M.D.   On: 07/29/2021 10:35   DG Chest Port 1 View  Result Date: 07/28/2021 CLINICAL DATA:  Acute respiratory distress. EXAM: PORTABLE CHEST 1 VIEW COMPARISON:  Chest radiograph July 27, 2021. FINDINGS: Suspected small bilateral pleural effusions. Overlying bibasilar opacities. No visible pneumothorax. Mild enlargement the cardiac silhouette. No acute osseous abnormality. IMPRESSION: 1. Suspected small bilateral pleural effusions with slow overlying bibasilar atelectasis and/or pneumonia. 2. Mild cardiomegaly. Electronically Signed   By: Margaretha Sheffield M.D.   On: 07/28/2021 12:15       LOS: 6 days   [facility] Pager on [email or web address]  07/30/2021, 11:22 AM

## 2021-07-30 NOTE — Progress Notes (Signed)
RN called patient's family to inform about restraints. Verbally agreed that best plan for safety.  Kathryn Underwood

## 2021-07-30 NOTE — Progress Notes (Signed)
Referring Physician(s): Dr. Silvano Rusk  Supervising Physician: Michaelle Birks  Patient Status:  [facility] - In-pt  Chief Complaint: Abdominal pain  Subjective: Attempted to bring patient to IR Monday afternoon, however was too agitated.  PA to bedside to assess for feasibility of procedure today.  Patient resting with eyes closed.  She does have mittens on with left forearm IV in place. Per RN, patient was combative this AM but did accept her Ativan.  Becomes agitated, restless, and adamantly refuses "everything."  "Leave me alone.  I don't want anything."   Allergies: Patient has no known allergies.  Medications: Prior to Admission medications   Medication Sig Start Date End Date Taking? Authorizing Provider  Eszopiclone 3 MG TABS Take 3 mg by mouth at bedtime as needed for sleep. Take immediately before bedtime   Yes [provider]  gabapentin (NEURONTIN) 300 MG capsule Take 2 capsules (600 mg total) by mouth 2 (two) times daily. 10/09/17  Yes Malvin Johns, MD  mirtazapine (REMERON) 45 MG tablet Take 45 mg by mouth at bedtime. 07/17/21  Yes [provider]  QUEtiapine (SEROQUEL) 50 MG tablet Take 1 tablet by mouth at bedtime. 07/17/21  Yes [provider]  chlordiazePOXIDE (LIBRIUM) 25 MG capsule '50mg'$  PO TID x 1D, then 25-'50mg'$  PO BID X 1D, then 25-'50mg'$  PO QD X 1D Patient not taking: Reported on 07/23/2021 03/24/18   Isla Pence, MD  cyclobenzaprine (FLEXERIL) 10 MG tablet Take 1 tablet (10 mg total) by mouth 3 (three) times daily as needed for muscle spasms. Patient not taking: Reported on 07/23/2021 10/09/17   Malvin Johns, MD  folic acid (FOLVITE) 1 MG tablet Take 1 tablet (1 mg total) by mouth daily. Patient not taking: Reported on 07/23/2021 04/29/16   Raiford Noble Latif, DO  Multiple Vitamin (MULTIVITAMIN WITH MINERALS) TABS tablet Take 1 tablet by mouth daily. Patient not taking: Reported on 07/24/2021 10/09/17   Georgette Shell, MD  nicotine  (NICODERM CQ - DOSED IN MG/24 HOURS) 21 mg/24hr patch Place 1 patch (21 mg total) onto the skin daily. Patient not taking: Reported on 07/23/2021 10/09/17   Georgette Shell, MD  pantoprazole (PROTONIX) 20 MG tablet Take 1 tablet (20 mg total) by mouth daily. Patient not taking: Reported on 07/23/2021 09/20/18   Caccavale, Sophia, PA-C  potassium chloride SA (K-DUR,KLOR-CON) 20 MEQ tablet Take 1 tablet (20 mEq total) by mouth daily. Patient not taking: Reported on 07/23/2021 10/09/17   Malvin Johns, MD  sucralfate (CARAFATE) 1 GM/10ML suspension Take 10 mLs (1 g total) by mouth 4 (four) times daily -  with meals and at bedtime. Patient not taking: Reported on 07/23/2021 09/20/18   Caccavale, Sophia, PA-C  thiamine 100 MG tablet Take 1 tablet (100 mg total) by mouth daily. Patient not taking: Reported on 07/23/2021 10/09/17   Georgette Shell, MD     Vital Signs: BP 96/61   Pulse 98   Temp 97.7 F (36.5 C) (Oral)   Resp 17   Wt 160 lb 15 oz (73 kg)   SpO2 96%   BMI 28.51 kg/m   Physical Exam Vitals and nursing note reviewed.  Constitutional:      General: She is not in acute distress.    Appearance: She is well-developed. She is not ill-appearing.  Cardiovascular:     Rate and Rhythm: Normal rate and regular rhythm.  Pulmonary:     Effort: Pulmonary effort is normal.     Breath sounds: Normal breath  sounds.  Skin:    General: Skin is warm and dry.  Neurological:     General: No focal deficit present.     Mental Status: She is alert and oriented to person, place, and time.  Psychiatric:        Mood and Affect: Mood normal.        Behavior: Behavior normal.     Imaging: DG Abd Portable 1V  Result Date: 07/29/2021 CLINICAL DATA:  Abdominal pain and distension. EXAM: PORTABLE ABDOMEN - 1 VIEW COMPARISON:  07/26/2021 radiograph and prior studies FINDINGS: The bowel gas pattern is normal.  No radio-opaque calculi are noted. Pelvic calcifications are compatible with small  calcified fibroids. No acute bony abnormality noted. IMPRESSION: No acute abnormality. Unremarkable bowel gas pattern. Electronically Signed   By: Margarette Canada M.D.   On: 07/29/2021 18:30   DG CHEST PORT 1 VIEW  Result Date: 07/29/2021 CLINICAL DATA:  Acute respiratory failure EXAM: PORTABLE CHEST 1 VIEW COMPARISON:  07/28/2021 FINDINGS: 0508 hours. The cardio pericardial silhouette is enlarged. There is pulmonary vascular congestion without overt pulmonary edema. Improvement in aeration at the lung bases with persistent left base collapse/consolidation and tiny left pleural effusion. Telemetry leads overlie the chest. IMPRESSION: 1. Interval improvement in aeration at the lung bases. 2. Pulmonary vascular congestion without overt pulmonary edema. 3. Persistent left base collapse/consolidation and tiny left pleural effusion. Electronically Signed   By: Misty Stanley M.D.   On: 07/29/2021 10:35   DG Chest Port 1 View  Result Date: 07/28/2021 CLINICAL DATA:  Acute respiratory distress. EXAM: PORTABLE CHEST 1 VIEW COMPARISON:  Chest radiograph July 27, 2021. FINDINGS: Suspected small bilateral pleural effusions. Overlying bibasilar opacities. No visible pneumothorax. Mild enlargement the cardiac silhouette. No acute osseous abnormality. IMPRESSION: 1. Suspected small bilateral pleural effusions with slow overlying bibasilar atelectasis and/or pneumonia. 2. Mild cardiomegaly. Electronically Signed   By: Margaretha Sheffield M.D.   On: 07/28/2021 12:15   CT ABDOMEN PELVIS W CONTRAST  Result Date: 07/27/2021 CLINICAL DATA:  Pancreatitis, acute, severe EXAM: CT ABDOMEN AND PELVIS WITH CONTRAST TECHNIQUE: Multidetector CT imaging of the abdomen and pelvis was performed using the standard protocol following bolus administration of intravenous contrast. RADIATION DOSE REDUCTION: This exam was performed according to the departmental dose-optimization program which includes automated exposure control, adjustment of the mA  and/or kV according to patient size and/or use of iterative reconstruction technique. CONTRAST:  11m OMNIPAQUE IOHEXOL 300 MG/ML  SOLN COMPARISON:  CT abdomen pelvis 07/24/2021 FINDINGS: Lower chest: Bilateral trace volume pleural effusions. Associated bilateral lower lobe passive atelectasis. Hepatobiliary: Pancreas: Previously identified peripancreatic lesion is now heterogeneous and slightly increased in size measuring 6.2 x 3.2 cm (from 5.7 x 2.7 cm). Increased heterogeneity of the lesion with density of 51 Hounsfield units. Focal main pancreatic duct dilatation measuring up to 5 mm. Spleen: Normal in size without focal abnormality. Adrenals/Urinary Tract: No adrenal nodule bilaterally. Bilateral kidneys enhance symmetrically. No hydronephrosis. No hydroureter. The urinary bladder is unremarkable. Stomach/Bowel: There is a 12 x 9 x 19 cm cystic lesion likely arising from the greater curvature of the stomach. No evidence of bowel wall thickening or dilatation. Appendix appears normal. Vascular/Lymphatic: Marked narrowing of the splenic artery along the posterior pancreas coarse if concern for nonocclusive thrombosis (3:35). No abdominal aorta or iliac aneurysm. Moderate to severe atherosclerotic plaque of the aorta and its branches. No abdominal, pelvic, or inguinal lymphadenopathy. Reproductive: Coarsely calcified uterine fibroids. Otherwise uterus and bilateral adnexa are unremarkable. Other: Trace  free fluid within the abdomen and pelvis centered along the pancreas. No intraperitoneal free gas. No organized fluid collection. Musculoskeletal: Subcutaneus soft tissue edema. No suspicious lytic or blastic osseous lesions. No acute displaced fracture. Multilevel degenerative changes of the spine. IMPRESSION: 1. Bilateral trace volume pleural effusions. 2. Previously identified peripancreatic lesion is now heterogeneous and slightly increased in size measuring 6.2 x 3.2 cm (from 5.7 x 2.7 cm). Interval increase in  density within the lesion (50 Hounsfield units) is concerning for underlying hemorrhage. When the patient is clinically stable and able to follow directions and hold their breath (preferably as an outpatient) further evaluation with dedicated abdominal MRI pancreatic protocol should be considered. 3. Marked narrowing of the splenic artery along the posterior pancreas coarse if concern for nonocclusive thrombosis. 4. Trace free fluid within the abdomen and pelvis centered along the pancreas. Pancreatitis is not excluded. Correlate with lipase levels. 5. A 12 x 9 x 19 cm cystic lesion likely arising from the greater curvature of the stomach. Query gist versus pancreatic pseudocyst versus pancreatic neoplasm related to the above findings. These results will be called to the ordering clinician or representative by the Radiologist Assistant, and communication documented in the PACS or [employer]. Electronically Signed   By: Iven Finn M.D.   On: 07/27/2021 16:39   DG CHEST PORT 1 VIEW  Result Date: 07/27/2021 CLINICAL DATA:  Shortness of breath.  Wheezing. EXAM: PORTABLE CHEST 1 VIEW COMPARISON:  07/24/2021 FINDINGS: Mild cardiomegaly shows increase since prior study. New mild diffuse interstitial infiltrates are suspicious for mild interstitial edema. New small bilateral pleural effusions and bibasilar atelectasis also noted. IMPRESSION: Mild acute congestive heart failure with small bilateral pleural effusions. Electronically Signed   By: Marlaine Hind M.D.   On: 07/27/2021 12:00   DG Abd Portable 1V  Result Date: 07/26/2021 CLINICAL DATA:  Abdominal distension. EXAM: PORTABLE ABDOMEN - 1 VIEW COMPARISON:  10/06/2017.  CT, 07/24/2021. FINDINGS: There is no bowel dilation to suggest obstruction. Soft tissues are poorly defined. No evidence of renal or ureteral stones. IMPRESSION: No acute findings or evidence of bowel obstruction. Electronically Signed   By: Lajean Manes M.D.   On: 07/26/2021 18:07     Labs:  CBC: Recent Labs    07/27/21 0009 07/28/21 0417 07/29/21 0249 07/30/21 0148  WBC 13.2* 13.5* 12.2* 14.4*  HGB 12.5 12.5 12.4 12.9  HCT 39.1 41.3 37.8 39.8  PLT 569* 511* 442* 477*    COAGS: Recent Labs    07/25/21 0027  INR 1.1    BMP: Recent Labs    07/28/21 0417 07/29/21 0249 07/29/21 1857 07/30/21 0148  NA 139 139 140 140  K 3.8 3.3* 3.5 3.1*  CL 101 103 99 101  CO2 31 32 27 26  GLUCOSE 127* 86 82 77  BUN 7* 9 10 7*  CALCIUM 8.4* 8.1* 8.3* 8.1*  CREATININE 0.49 0.41* 0.52 0.50  GFRNONAA >60 >60 >60 >60    LIVER FUNCTION TESTS: Recent Labs    07/27/21 0009 07/28/21 0417 07/29/21 0249 07/30/21 0148  BILITOT 0.2* 0.6 0.5 1.2  AST '29 22 18 19  '$ ALT '14 12 14 13  '$ ALKPHOS 92 92 73 80  PROT 4.8* 4.6* 4.3* 4.4*  ALBUMIN 2.5* 2.4* 2.2* 2.2*    Assessment and Plan: Peripancreatic fluid collection Patient with large pseudocyst vs. Hemorrhagic cyst with functional compression of the entire stomach.   Currently NPO with increased aspiration risk due to compression related to cyst.  IR  consulted for drainage, however patient currently not appropriate for procedure as she remains highly agitated, restless, and uncooperative.  Of note, her WBC is 14.4 today.   Patient continues with CIWA protocol.  She is accepting Ativan.  Hopefully as she progresses through withdrawal she will become appropriate for procedure attempt.   IR will continue to assess daily.  She is NPO for now.   Electronically Signed: Docia Barrier, PA 07/30/2021, 12:32 PM   I spent a total of 15 Minutes at the the patient's bedside AND on the patient's hospital floor or unit, greater than 50% of which was counseling/coordinating care for peripancreatic fluid collection.

## 2021-07-30 NOTE — Progress Notes (Addendum)
Daily Rounding Note  07/30/2021, 12:49 PM  LOS: 6 days   SUBJECTIVE:   Chief complaint: Alcoholic pancreatitis, large pseudocyst.     Currently on CL diet.  Not eatng much.  Agitated and refusing staff measures, meds, procedures.  When I asked her what she wanted she said, I want you to leave".  If no answer to question if she had any pain or nausea. Passing soft to liquid dark green stools.  OBJECTIVE:         Vital signs in last 24 hours:    Temp:  [97.5 F (36.4 C)-99.1 F (37.3 C)] 97.7 F (36.5 C) (07/05 0926) Pulse Rate:  [94-107] 98 (07/05 1102) Resp:  [15-22] 17 (07/05 0927) BP: (96-132)/(61-88) 96/61 (07/05 1102) SpO2:  [95 %-100 %] 96 % (07/05 0927) Last BM Date : 07/29/21 Filed Weights   07/26/21 0411 07/28/21 1422 07/29/21 0500  Weight: 70.4 kg 72.5 kg 73 kg   General: Looks ill.  Agitated.  Appears to be comfortable Heart: RRR Chest: Clear without labored breathing or cough Abdomen: Soft without tenderness.  Distended/protuberant.  Bowel sounds active Extremities: Nonpitting lower extremity swelling mild. Neuro/Psych: Laconic.  Not answering most questions.  Moving her arms and legs in an agitated manner.  Protective mittens on her hands.  Intake/Output from previous day: 07/04 0701 - 07/05 0700 In: 1137.2 [P.O.:240; I.V.:597.2; IV Piggyback:300] Out: 650 [Urine:650]    Lab Results: Recent Labs    07/28/21 0417 07/29/21 0249 07/30/21 0148  WBC 13.5* 12.2* 14.4*  HGB 12.5 12.4 12.9  HCT 41.3 37.8 39.8  PLT 511* 442* 477*   BMET Recent Labs    07/29/21 0249 07/29/21 1857 07/30/21 0148  NA 139 140 140  K 3.3* 3.5 3.1*  CL 103 99 101  CO2 32 27 26  GLUCOSE 86 82 77  BUN 9 10 7*  CREATININE 0.41* 0.52 0.50  CALCIUM 8.1* 8.3* 8.1*   LFT Recent Labs    07/28/21 0417 07/29/21 0249 07/30/21 0148  PROT 4.6* 4.3* 4.4*  ALBUMIN 2.4* 2.2* 2.2*  AST '22 18 19  '$ ALT '12 14 13  '$ ALKPHOS  92 73 80  BILITOT 0.6 0.5 1.2     Studies/Results: DG Abd Portable 1V  Result Date: 07/29/2021 CLINICAL DATA:  Abdominal pain and distension. EXAM: PORTABLE ABDOMEN - 1 VIEW COMPARISON:  07/26/2021 radiograph and prior studies FINDINGS: The bowel gas pattern is normal.  No radio-opaque calculi are noted. Pelvic calcifications are compatible with small calcified fibroids. No acute bony abnormality noted. IMPRESSION: No acute abnormality. Unremarkable bowel gas pattern. Electronically Signed   By: Margarette Canada M.D.   On: 07/29/2021 18:30   DG CHEST PORT 1 VIEW  Result Date: 07/29/2021 CLINICAL DATA:  Acute respiratory failure EXAM: PORTABLE CHEST 1 VIEW COMPARISON:  07/28/2021 FINDINGS: 0508 hours. The cardio pericardial silhouette is enlarged. There is pulmonary vascular congestion without overt pulmonary edema. Improvement in aeration at the lung bases with persistent left base collapse/consolidation and tiny left pleural effusion. Telemetry leads overlie the chest. IMPRESSION: 1. Interval improvement in aeration at the lung bases. 2. Pulmonary vascular congestion without overt pulmonary edema. 3. Persistent left base collapse/consolidation and tiny left pleural effusion. Electronically Signed   By: Misty Stanley M.D.   On: 07/29/2021 10:35    Scheduled Meds:  acetaminophen  1,000 mg Oral TID   Chlorhexidine Gluconate Cloth  6 each Topical Daily   folic acid  1 mg  Oral Daily   mouth rinse  15 mL Mouth Rinse 4 times per day   pantoprazole (PROTONIX) IV  40 mg Intravenous Q12H   thiamine  100 mg Oral Daily   Continuous Infusions:  0.45 % NaCl with KCl 20 mEq / L 75 mL/hr at 07/30/21 1239   potassium chloride 10 mEq (07/30/21 1245)   PRN Meds:.albuterol, LORazepam **OR** LORazepam, morphine injection, naloxone, ondansetron **OR** ondansetron (ZOFRAN) IV, mouth rinse, mouth rinse, oxyCODONE    ASSESMENT:   Acute alcoholic pancreatitis with large evolving pancreatic fluid  collections/pseudocysts, 1 of these is compressing stomach.    Abdominal distention.  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  Agitation today.  Yesterday was quite appropriate.  Today she is wearing protective mittens, was combative, agitated and refusing everything.  IR unable to proceed with cyst aspiration because of her agitation/lack of cooperation  Fatty liver, scattered liver hypodensities representing cysts vs focal fat.  Hypokalemia, persists.  Macrocytosis without anemia.  B12, folate okay.  Mild tachycardia, periodic hypotension.  Suspect tachycardia is due to agitation, alcohol withdrawal.  Respiratory challenges in setting of AMS.  Currently on nasal cannula oxygen at 4 L/minute   PLAN   IR planning to reassess daily and proceed with aspiration when able    Azucena Freed  07/30/2021, 12:49 PM Phone [phone]   GI Attending:  Mental status and behavioral issues are impacting care - continue supportive measures and hopefully will improve to allow aspiration of massive pseudocyst  Gatha Mayer, MD, [facility] See Shea Evans on call - gastroenterology for best contact person 07/30/2021 3:28 PM

## 2021-07-31 ENCOUNTER — Inpatient Hospital Stay (HOSPITAL_COMMUNITY): Payer: Self-pay

## 2021-07-31 LAB — CBC WITH DIFFERENTIAL/PLATELET
Abs Immature Granulocytes: 0.09 10*3/uL — ABNORMAL HIGH (ref 0.00–0.07)
Basophils Absolute: 0.1 10*3/uL (ref 0.0–0.1)
Basophils Relative: 1 %
Eosinophils Absolute: 0.3 10*3/uL (ref 0.0–0.5)
Eosinophils Relative: 2 %
HCT: 40.8 % (ref 36.0–46.0)
Hemoglobin: 13 g/dL (ref 12.0–15.0)
Immature Granulocytes: 1 %
Lymphocytes Relative: 12 %
Lymphs Abs: 1.5 10*3/uL (ref 0.7–4.0)
MCH: 34.4 pg — ABNORMAL HIGH (ref 26.0–34.0)
MCHC: 31.9 g/dL (ref 30.0–36.0)
MCV: 107.9 fL — ABNORMAL HIGH (ref 80.0–100.0)
Monocytes Absolute: 1.2 10*3/uL — ABNORMAL HIGH (ref 0.1–1.0)
Monocytes Relative: 9 %
Neutro Abs: 9.4 10*3/uL — ABNORMAL HIGH (ref 1.7–7.7)
Neutrophils Relative %: 75 %
Platelets: 501 10*3/uL — ABNORMAL HIGH (ref 150–400)
RBC: 3.78 MIL/uL — ABNORMAL LOW (ref 3.87–5.11)
RDW: 13.2 % (ref 11.5–15.5)
WBC: 12.5 10*3/uL — ABNORMAL HIGH (ref 4.0–10.5)
nRBC: 0 % (ref 0.0–0.2)

## 2021-07-31 LAB — MAGNESIUM: Magnesium: 1.9 mg/dL (ref 1.7–2.4)

## 2021-07-31 LAB — COMPREHENSIVE METABOLIC PANEL
ALT: 13 U/L (ref 0–44)
AST: 21 U/L (ref 15–41)
Albumin: 2.3 g/dL — ABNORMAL LOW (ref 3.5–5.0)
Alkaline Phosphatase: 78 U/L (ref 38–126)
Anion gap: 9 (ref 5–15)
BUN: 5 mg/dL — ABNORMAL LOW (ref 8–23)
CO2: 28 mmol/L (ref 22–32)
Calcium: 8.1 mg/dL — ABNORMAL LOW (ref 8.9–10.3)
Chloride: 105 mmol/L (ref 98–111)
Creatinine, Ser: 0.5 mg/dL (ref 0.44–1.00)
GFR, Estimated: >60 mL/min (ref >60)
Glucose, Bld: 77 mg/dL (ref 70–99)
Potassium: 3.2 mmol/L — ABNORMAL LOW (ref 3.5–5.1)
Sodium: 142 mmol/L (ref 135–145)
Total Bilirubin: 0.7 mg/dL (ref 0.3–1.2)
Total Protein: 4.6 g/dL — ABNORMAL LOW (ref 6.5–8.1)

## 2021-07-31 MED ORDER — LIDOCAINE HCL 1 % IJ SOLN
INTRAMUSCULAR | Status: AC
  Filled 2021-07-31: qty 10

## 2021-07-31 MED ORDER — FLUMAZENIL 0.5 MG/5ML IV SOLN
INTRAVENOUS | Status: AC
  Filled 2021-07-31: qty 5

## 2021-07-31 MED ORDER — MIDAZOLAM HCL 2 MG/2ML IJ SOLN
INTRAMUSCULAR | Status: AC | PRN
  Administered 2021-07-31: .5 mg via INTRAVENOUS

## 2021-07-31 MED ORDER — MIDAZOLAM HCL 2 MG/2ML IJ SOLN
INTRAMUSCULAR | Status: AC
  Filled 2021-07-31: qty 2

## 2021-07-31 MED ORDER — FENTANYL CITRATE (PF) 100 MCG/2ML IJ SOLN
INTRAMUSCULAR | Status: AC
  Filled 2021-07-31: qty 2

## 2021-07-31 MED ORDER — NALOXONE HCL 0.4 MG/ML IJ SOLN
INTRAMUSCULAR | Status: AC
  Filled 2021-07-31: qty 1

## 2021-07-31 MED ORDER — POTASSIUM CHLORIDE 10 MEQ/100ML IV SOLN
10.0000 meq | INTRAVENOUS | Status: AC
  Administered 2021-07-31 (×3): 10 meq via INTRAVENOUS
  Filled 2021-07-31 (×4): qty 100

## 2021-07-31 NOTE — Procedures (Signed)
Interventional Radiology Procedure Note  Procedure: CT guided peri-pancreatic fluid drainage  Indication: Peri-pancreatic fluid collection  Findings: Please refer to procedural dictation for full description.  Complications: None  EBL: < 10 mL  Miachel Roux, MD [phone]

## 2021-07-31 NOTE — Progress Notes (Signed)
[facility] PROGRESS NOTE   Kathryn Underwood NWG:[id number] DOB: 11/15/1960 DOA: 07/24/2021  PCP: Fredirick Lathe, PA-C  Brief History/Interval Summary: 61 year old female with past medical history of anxiety, depression, GERD, alcohol use drinks 1 wine bottle per day presented with abdominal pain ongoing for 2 weeks. Found to have acute pancreatitis as well as pancreatic cyst.  To be transferred to the ICU due to oversedation.  Subsequently transferred back out.   Consultants: Gastroenterology.  Interventional radiology  Procedures: None   Subjective/Interval History: Overnight events noted.  Patient noted to be confused and agitated requiring restraints.  Answers some of my questions but willing to engage.      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  Patient seems to be stable for the most part.  Abdominal CT scan was repeated on 7/2 which showed evidence of pancreatic hemorrhage and lesion along with worsening cirrhosis. Interventional radiology was consulted.  Plan is for drainage of the pancreatic fluid collection when patient is more cooperative.  Acute metabolic encephalopathy Likely due to delirium from alcohol withdrawal.  Patient has been on CIWA protocol.  Requiring restraints.  Once again no neurological deficits are noted.  Imaging studies can be considered but unlikely she will cooperate.  This appears to be secondary to withdrawal.  Haldol as needed.  Could try Seroquel although it looks like patient has been refusing her oral medications.  Alcohol dependence Has been counseled.  See above.  On CIWA protocol.  Continue thiamine.  Acute hypoxic and hypercapnic respiratory failure Likely due to combination of hypoventilation from abdominal pain along with delirium from alcohol withdrawal and intravenous agents including benzodiazepines and pain medications. Patient was transferred  to the ICU.  Monitored closely.  Did not require intubation.  Received IV Narcan.  Transferred back up to the floor.  Currently noted to be on oxygen at 4 L/min.  Monitor saturations and mentation closely.  Thrombosis of splenic artery We will continue to monitor.  Likely related to pancreatitis.  No anticoagulation due to concern for pancreatic hemorrhage.  Hypokalemia Continue to supplement potassium.  Magnesium 1.9.  History of COPD Stable.  Left pulmonary nodule Will require outpatient surveillance.  Uterine fibroid Incidentally noted.  Outpatient follow-up.  History of depression and anxiety Home medications including Seroquel Remeron gabapentin and eszopiclone on hold.  DVT Prophylaxis: SCDs.  No anticoagulation due to pancreatic hemorrhage Code Status: Full code Family Communication: Discussed with patient.  No family at bedside Disposition Plan: Hopefully return home in improved  Status is: Inpatient Remains inpatient appropriate because: Delirium, pancreatitis    Medications: Scheduled:  acetaminophen  1,000 mg Oral TID   Chlorhexidine Gluconate Cloth  6 each Topical Daily   folic acid  1 mg Oral Daily   mouth rinse  15 mL Mouth Rinse 4 times per day   pantoprazole  40 mg Oral Q0600   thiamine injection  100 mg Intravenous Daily   Continuous:  0.45 % NaCl with KCl 20 mEq / L 75 mL/hr at 07/31/21 0521   potassium chloride 10 mEq (07/31/21 0926)   VHQ:IONGEXBMW, haloperidol lactate, LORazepam **OR** LORazepam, morphine injection, naloxone, ondansetron **OR** ondansetron (ZOFRAN) IV, mouth rinse, oxyCODONE  Antibiotics: Anti-infectives (From admission, onward)    Start     Dose/Rate Route Frequency Ordered Stop   07/24/21 2230  cefTRIAXone (ROCEPHIN) 1 g in sodium chloride 0.9 % 100 mL IVPB  Status:  Discontinued        1 g 200 mL/hr  over 30 Minutes Intravenous Every 24 hours 07/24/21 2130 07/27/21 0729   07/24/21 2230  azithromycin (ZITHROMAX) 500 mg in  sodium chloride 0.9 % 250 mL IVPB  Status:  Discontinued        500 mg 250 mL/hr over 60 Minutes Intravenous Every 24 hours 07/24/21 2130 07/26/21 1415       Objective:  Vital Signs  Vitals:   07/30/21 2010 07/30/21 2319 07/31/21 0349 07/31/21 0805  BP: 114/71 108/66 103/75 107/68  Pulse: (!) 102 93 97 92  Resp: 20 19 (!) 21 18  Temp: 97.9 F (36.6 C) 98.3 F (36.8 C) 98.3 F (36.8 C) 98.3 F (36.8 C)  TempSrc: Oral Oral Tympanic Tympanic  SpO2: 94% 96% 95% 96%  Weight:      Height: '5\' 3"'$  (1.6 m)       Intake/Output Summary (Last 24 hours) at 07/31/2021 0926 Last data filed at 07/31/2021 3614 Gross per 24 hour  Intake 372.86 ml  Output --  Net 372.86 ml    Filed Weights   07/26/21 0411 07/28/21 1422 07/29/21 0500  Weight: 70.4 kg 72.5 kg 73 kg    General appearance: Somnolent but easily arousable.  Does not want answer questions or engage.  Does not give a reason for the same.  Mildly agitated. Resp: Diminished air entry at the bases noted.  No wheezing or rhonchi.  No definite crackles. Cardio: S1-S2 is normal regular.  No S3-S4.  No rubs murmurs or bruit GI: Abdomen is soft.  Nontender nondistended.  Bowel sounds are present normal.  No masses organomegaly Extremities: No edema.  Full range of motion of lower extremities. Neurologic: No focal neurological deficits.     Lab Results:  Data Reviewed: I have personally reviewed following labs and reports of the imaging studies  CBC: Recent Labs  Lab 07/26/21 0619 07/27/21 0009 07/28/21 0417 07/29/21 0249 07/30/21 0148 07/31/21 0207  WBC 17.5* 13.2* 13.5* 12.2* 14.4* 12.5*  NEUTROABS 15.9* 10.1* 10.9*  --  12.0* 9.4*  HGB 13.7 12.5 12.5 12.4 12.9 13.0  HCT 42.0 39.1 41.3 37.8 39.8 40.8  MCV 107.7* 108.3* 112.2* 109.9* 108.2* 107.9*  PLT 591* 569* 511* 442* 477* 501*     Basic Metabolic Panel: Recent Labs  Lab 07/24/21 1703 07/25/21 0027 07/27/21 0009 07/28/21 0417 07/29/21 0249 07/29/21 1857  07/30/21 0148 07/31/21 0207  NA 138   < > 136 139 139 140 140 142  K 3.8   < > 3.6 3.8 3.3* 3.5 3.1* 3.2*  CL 100   < > 96* 101 103 99 101 105  CO2 28   < > 31 31 32 '27 26 28  '$ GLUCOSE 111*   < > 105* 127* 86 82 77 77  BUN 5*   < > 6* 7* 9 10 7* 5*  CREATININE 0.54   < > 0.41* 0.49 0.41* 0.52 0.50 0.50  CALCIUM 8.6*   < > 8.7* 8.4* 8.1* 8.3* 8.1* 8.1*  MG 2.0   < > 2.1 1.8  --  2.0 1.8 1.9  PHOS 3.5  --   --   --   --   --   --   --    < > = values in this interval not displayed.     GFR: Estimated Creatinine Clearance: 69.8 mL/min (by C-G formula based on SCr of 0.5 mg/dL).  Liver Function Tests: Recent Labs  Lab 07/27/21 0009 07/28/21 0417 07/29/21 0249 07/30/21 0148 07/31/21 0207  AST 29 22  $'18 19 21  'j$ ALT '14 12 14 13 13  '$ ALKPHOS 92 92 73 80 78  BILITOT 0.2* 0.6 0.5 1.2 0.7  PROT 4.8* 4.6* 4.3* 4.4* 4.6*  ALBUMIN 2.5* 2.4* 2.2* 2.2* 2.3*     Recent Labs  Lab 07/26/21 0619  LIPASE 129*     Coagulation Profile: Recent Labs  Lab 07/25/21 0027  INR 1.1     CBG: Recent Labs  Lab 07/28/21 1536 07/28/21 1906 07/28/21 2300 07/29/21 0302 07/29/21 0743  GLUCAP 93 103* 77 86 88      Recent Results (from the past 240 hour(s))  SARS Coronavirus 2 by RT PCR (hospital order, performed in [facility] hospital lab) *cepheid single result test* Anterior Nasal Swab     Status: None   Collection Time: 07/24/21  9:05 AM   Specimen: Anterior Nasal Swab  Result Value Ref Range Status   SARS Coronavirus 2 by RT PCR NEGATIVE NEGATIVE Final    Comment: (NOTE) SARS-CoV-2 target nucleic acids are NOT DETECTED.  The SARS-CoV-2 RNA is generally detectable in upper and lower respiratory specimens during the acute phase of infection. The lowest concentration of SARS-CoV-2 viral copies this assay can detect is 250 copies / mL. A negative result does not preclude SARS-CoV-2 infection and should not be used as the sole basis for treatment or other patient management  decisions.  A negative result may occur with improper specimen collection / handling, submission of specimen other than nasopharyngeal swab, presence of viral mutation(s) within the areas targeted by this assay, and inadequate number of viral copies (<250 copies / mL). A negative result must be combined with clinical observations, patient history, and epidemiological information.  Fact Sheet for Patients:   https://www.patel.info/  Fact Sheet for Healthcare Providers: https://hall.com/  This test is not yet approved or  cleared by the [place] FDA and has been authorized for detection and/or diagnosis of SARS-CoV-2 by FDA under an Emergency Use Authorization (EUA).  This EUA will remain in effect (meaning this test can be used) for the duration of the COVID-19 declaration under Section 564(b)(1) of the Act, 21 U.S.C. section 360bbb-3(b)(1), unless the authorization is terminated or revoked sooner.  Performed at [facility], [address] [place], [place] [address]   MRSA Next Gen by PCR, Nasal     Status: None   Collection Time: 07/28/21  2:25 PM   Specimen: Nasal Mucosa; Nasal Swab  Result Value Ref Range Status   MRSA by PCR Next Gen NOT DETECTED NOT DETECTED Final    Comment: (NOTE) The GeneXpert MRSA Assay (FDA approved for NASAL specimens only), is one component of a comprehensive MRSA colonization surveillance program. It is not intended to diagnose MRSA infection nor to guide or monitor treatment for MRSA infections. Test performance is not FDA approved in patients less than 61 years old. Performed at [facility], [place] [address], [place], [place] [address]       Radiology Studies: DG Abd Portable 1V  Result Date: 07/29/2021 CLINICAL DATA:  Abdominal pain and distension. EXAM: PORTABLE ABDOMEN - 1 VIEW COMPARISON:  07/26/2021 radiograph and prior studies FINDINGS: The bowel gas pattern is  normal.  No radio-opaque calculi are noted. Pelvic calcifications are compatible with small calcified fibroids. No acute bony abnormality noted. IMPRESSION: No acute abnormality. Unremarkable bowel gas pattern. Electronically Signed   By: Margarette Canada M.D.   On: 07/29/2021 18:30       LOS: 7 days   Neco Kling  [place] Pink  [facility], [email or web address]  07/31/2021, 9:26 AM

## 2021-07-31 NOTE — Progress Notes (Signed)
Pt off floor in IR for procedure, unable to complete 1400 restraint assessment.  Raelyn Number, RN

## 2021-07-31 NOTE — Progress Notes (Addendum)
    Progress Note   Subjective  Chief Complaint: Alcoholic pancreatitis and large pseudocyst  Patient is sedated this morning and really unable to answer any of my questions.  Per nursing remained agitated overnight.   Objective   Vital signs in last 24 hours: Temp:  [97.9 F (36.6 C)-98.3 F (36.8 C)] 98.3 F (36.8 C) (07/06 0805) Pulse Rate:  [92-106] 92 (07/06 0805) Resp:  [18-21] 18 (07/06 0805) BP: (96-120)/(61-80) 107/68 (07/06 0805) SpO2:  [94 %-97 %] 96 % (07/06 0805) Last BM Date : 07/30/21 General:    white female in NAD, sedated and restrained Heart:  Regular rate and rhythm; no murmurs Lungs: Respirations even and unlabored, lungs CTA bilaterally Abdomen:  Soft, marked ttp over epigastrum and nondistended. Normal bowel sounds. Psych:  Cooperative. Normal mood and affect.  Intake/Output from previous day: 07/05 0701 - 07/06 0700 In: 372.9 [I.V.:72.9; IV Piggyback:300] Out: -    Lab Results: Recent Labs    07/29/21 0249 07/30/21 0148 07/31/21 0207  WBC 12.2* 14.4* 12.5*  HGB 12.4 12.9 13.0  HCT 37.8 39.8 40.8  PLT 442* 477* 501*   BMET Recent Labs    07/29/21 1857 07/30/21 0148 07/31/21 0207  NA 140 140 142  K 3.5 3.1* 3.2*  CL 99 101 105  CO2 '27 26 28  '$ GLUCOSE 82 77 77  BUN 10 7* 5*  CREATININE 0.52 0.50 0.50  CALCIUM 8.3* 8.1* 8.1*   LFT Recent Labs    07/31/21 0207  PROT 4.6*  ALBUMIN 2.3*  AST 21  ALT 13  ALKPHOS 78  BILITOT 0.7   Studies/Results: DG Abd Portable 1V  Result Date: 07/29/2021 CLINICAL DATA:  Abdominal pain and distension. EXAM: PORTABLE ABDOMEN - 1 VIEW COMPARISON:  07/26/2021 radiograph and prior studies FINDINGS: The bowel gas pattern is normal.  No radio-opaque calculi are noted. Pelvic calcifications are compatible with small calcified fibroids. No acute bony abnormality noted. IMPRESSION: No acute abnormality. Unremarkable bowel gas pattern. Electronically Signed   By: Margarette Canada M.D.   On: 07/29/2021 18:30       Assessment / Plan:   Assessment: 1.  Acute alcoholic pancreatitis: With large evolving pancreatic fluid collection/pseudocyst, One of these is compressing the stomach 2.  Abdominal distention: KUB 07/29/2021 unremarkable 3.  AMS in the setting of alcohol withdrawal: Remains agitated today, though sedated at time of my exam 4.  Fatty liver 5.  Hypokalemia: Persists  Plan: 1.  IR is planning to reassess daily and proceed with aspiration when able 2.  Continue other supportive measures  Thank you for your kind consultation, we will continue to follow along.    LOS: 7 days   Levin Erp  07/31/2021, 10:24 AM  Gastroenterology attending:  I have also seen and evaluated the patient.  Her mental status is improved this evening and she has had aspiration of the pancreatic pseudocyst.  We will follow-up on the studies and determine the next steps.  Gatha Mayer, MD, [facility] See Shea Evans on call - gastroenterology for best contact person 07/31/2021 5:28 PM

## 2021-08-01 ENCOUNTER — Telehealth: Payer: Self-pay

## 2021-08-01 ENCOUNTER — Inpatient Hospital Stay (HOSPITAL_COMMUNITY): Payer: Self-pay

## 2021-08-01 DIAGNOSIS — J9601 Acute respiratory failure with hypoxia: Secondary | ICD-10-CM

## 2021-08-01 LAB — CBC WITH DIFFERENTIAL/PLATELET
Abs Immature Granulocytes: 0.04 10*3/uL (ref 0.00–0.07)
Basophils Absolute: 0.1 10*3/uL (ref 0.0–0.1)
Basophils Relative: 1 %
Eosinophils Absolute: 0.3 10*3/uL (ref 0.0–0.5)
Eosinophils Relative: 3 %
HCT: 38.2 % (ref 36.0–46.0)
Hemoglobin: 12.3 g/dL (ref 12.0–15.0)
Immature Granulocytes: 0 %
Lymphocytes Relative: 16 %
Lymphs Abs: 1.6 10*3/uL (ref 0.7–4.0)
MCH: 34.6 pg — ABNORMAL HIGH (ref 26.0–34.0)
MCHC: 32.2 g/dL (ref 30.0–36.0)
MCV: 107.6 fL — ABNORMAL HIGH (ref 80.0–100.0)
Monocytes Absolute: 1 10*3/uL (ref 0.1–1.0)
Monocytes Relative: 10 %
Neutro Abs: 6.6 10*3/uL (ref 1.7–7.7)
Neutrophils Relative %: 70 %
Platelets: 482 10*3/uL — ABNORMAL HIGH (ref 150–400)
RBC: 3.55 MIL/uL — ABNORMAL LOW (ref 3.87–5.11)
RDW: 13.2 % (ref 11.5–15.5)
WBC: 9.4 10*3/uL (ref 4.0–10.5)
nRBC: 0 % (ref 0.0–0.2)

## 2021-08-01 LAB — COMPREHENSIVE METABOLIC PANEL
ALT: 12 U/L (ref 0–44)
AST: 21 U/L (ref 15–41)
Albumin: 2.1 g/dL — ABNORMAL LOW (ref 3.5–5.0)
Alkaline Phosphatase: 64 U/L (ref 38–126)
Anion gap: 9 (ref 5–15)
BUN: <5 mg/dL — ABNORMAL LOW (ref 8–23)
CO2: 27 mmol/L (ref 22–32)
Calcium: 7.9 mg/dL — ABNORMAL LOW (ref 8.9–10.3)
Chloride: 103 mmol/L (ref 98–111)
Creatinine, Ser: 0.58 mg/dL (ref 0.44–1.00)
GFR, Estimated: >60 mL/min (ref >60)
Glucose, Bld: 78 mg/dL (ref 70–99)
Potassium: 3.4 mmol/L — ABNORMAL LOW (ref 3.5–5.1)
Sodium: 139 mmol/L (ref 135–145)
Total Bilirubin: 0.8 mg/dL (ref 0.3–1.2)
Total Protein: 4.2 g/dL — ABNORMAL LOW (ref 6.5–8.1)

## 2021-08-01 LAB — MAGNESIUM: Magnesium: 1.7 mg/dL (ref 1.7–2.4)

## 2021-08-01 LAB — GLUCOSE, CAPILLARY: Glucose-Capillary: 81 mg/dL (ref 70–99)

## 2021-08-01 MED ORDER — IPRATROPIUM BROMIDE 0.02 % IN SOLN
0.5000 mg | Freq: Four times a day (QID) | RESPIRATORY_TRACT | Status: DC
  Administered 2021-08-01 – 2021-08-03 (×9): 0.5 mg via RESPIRATORY_TRACT
  Filled 2021-08-01 (×9): qty 2.5

## 2021-08-01 MED ORDER — LEVALBUTEROL HCL 0.63 MG/3ML IN NEBU
0.6300 mg | INHALATION_SOLUTION | Freq: Four times a day (QID) | RESPIRATORY_TRACT | Status: DC | PRN
  Administered 2021-08-01: 0.63 mg via RESPIRATORY_TRACT

## 2021-08-01 MED ORDER — POTASSIUM CHLORIDE 10 MEQ/100ML IV SOLN
10.0000 meq | INTRAVENOUS | Status: AC
  Administered 2021-08-01 (×3): 10 meq via INTRAVENOUS
  Filled 2021-08-01 (×3): qty 100

## 2021-08-01 MED ORDER — MAGNESIUM SULFATE 2 GM/50ML IV SOLN
2.0000 g | Freq: Once | INTRAVENOUS | Status: AC
  Administered 2021-08-01: 2 g via INTRAVENOUS
  Filled 2021-08-01: qty 50

## 2021-08-01 MED ORDER — LEVALBUTEROL HCL 0.63 MG/3ML IN NEBU
0.6300 mg | INHALATION_SOLUTION | Freq: Four times a day (QID) | RESPIRATORY_TRACT | Status: DC
  Administered 2021-08-01 – 2021-08-03 (×8): 0.63 mg via RESPIRATORY_TRACT
  Filled 2021-08-01 (×9): qty 3

## 2021-08-01 NOTE — Telephone Encounter (Signed)
-----   Message from Levin Erp, [place] sent at 08/01/2021 10:44 AM EDT ----- Regarding: Follow up Please get patient follow up with Nevin Bloodgood in the next 4-6 weeks for pancreatitis and psuedocyst.  Thanks-JLL

## 2021-08-01 NOTE — Progress Notes (Signed)
Nurse notified of pt sudden increase in pts HR. HR sustaining in 130s-140s and RR 30s-40s. Pt c/o dyspnea, displayed labored breathing, and wheezing auscultated. Obtained EKG and notified [facility]ist. New orders placed for neb tx. Pt expresses relief after interventions and pt exchange is improved.  Raelyn Number, RN

## 2021-08-01 NOTE — Progress Notes (Addendum)
Progress Note   Subjective  Chief Complaint: Alcoholic pancreatitis large pseudocyst  Patient is slightly more awake this morning, though wanting to fall back asleep during time my exam, does tell me that her abdominal pain feels some better after the removal of fluid.  Denies any other acute complaints or concerns.   Objective   Vital signs in last 24 hours: Temp:  [97.6 F (36.4 C)-98.9 F (37.2 C)] 98.2 F (36.8 C) (07/07 0300) Pulse Rate:  [83-129] 103 (07/07 0900) Resp:  [17-35] 22 (07/07 0900) BP: (113-125)/(59-79) 116/59 (07/07 0900) SpO2:  [94 %-100 %] 97 % (07/07 0900) Last BM Date : 07/31/21 General:    white female in NAD, lethargic Heart:  Regular rate and rhythm; no murmurs Lungs: Respirations even and unlabored, lungs CTA bilaterally Abdomen:  Soft, mild epigastric TTP and nondistended. Normal bowel sounds. Psych:  Cooperative.  Lethargic  Intake/Output from previous day: 07/06 0701 - 07/07 0700 In: 480 [P.O.:480] Out: 1300    Lab Results: Recent Labs    07/30/21 0148 07/31/21 0207 08/01/21 0039  WBC 14.4* 12.5* 9.4  HGB 12.9 13.0 12.3  HCT 39.8 40.8 38.2  PLT 477* 501* 482*   BMET Recent Labs    07/30/21 0148 07/31/21 0207 08/01/21 0039  NA 140 142 139  K 3.1* 3.2* 3.4*  CL 101 105 103  CO2 '26 28 27  '$ GLUCOSE 77 77 78  BUN 7* 5* <5*  CREATININE 0.50 0.50 0.58  CALCIUM 8.1* 8.1* 7.9*   LFT Recent Labs    08/01/21 0039  PROT 4.2*  ALBUMIN 2.1*  AST 21  ALT 12  ALKPHOS 64  BILITOT 0.8   Studies/Results: DG CHEST PORT 1 VIEW  Result Date: 08/01/2021 CLINICAL DATA:  Dyspnea EXAM: PORTABLE CHEST 1 VIEW COMPARISON:  07/29/2021 FINDINGS: Patchy bibasilar atelectasis/consolidation. Small right pleural effusion. Heart size is normal for technique. IMPRESSION: Patchy bibasilar atelectasis/consolidation. Small right pleural effusion. Electronically Signed   By: Macy Mis M.D.   On: 08/01/2021 08:18   CT ASPIRATION  Result Date:  07/31/2021 INDICATION: 61 year old woman with peripancreatic fluid collection presents to IR for CT-guided aspiration. EXAM: CT guided peripancreatic fluid drainage MEDICATIONS: The patient is currently admitted to the hospital and receiving intravenous antibiotics. The antibiotics were administered within an appropriate time frame prior to the initiation of the procedure. ANESTHESIA/SEDATION: 0.5 mg IV Versed Moderate Sedation Time:  13 minutes The patient was continuously monitored during the procedure by the interventional radiology nurse under my direct supervision. COMPLICATIONS: None immediate. TECHNIQUE: Informed written consent was obtained from the patient after a thorough discussion of the procedural risks, benefits and alternatives. All questions were addressed. Maximal Sterile Barrier Technique was utilized including caps, mask, sterile gowns, sterile gloves, sterile drape, hand hygiene and skin antiseptic. A timeout was performed prior to the initiation of the procedure. PROCEDURE: The left anterolateral abdominal wall was prepped with Chlorhexidine in a sterile fashion, and a sterile drape was applied covering the operative field. Sterile gown and sterile gloves were used for the procedure. Local anesthesia was provided with 1% Lidocaine. Utilizing CT guidance 19 ga Yueh needle was inserted into the peripancreatic fluid collection. 1.3 L of dark brown fluid was removed. Post aspiration CT demonstrated significant reduction in size of fluid collection with some fluid still remaining adjacent to the greater curvature of the stomach. Samples were sent for gram stain, culture, cytology and amylase. IMPRESSION: CT-guided aspiration of peripancreatic fluid collection. Electronically Signed   By: Sharen Heck  Mir M.D.   On: 07/31/2021 16:55       Assessment / Plan:   Assessment: 1.  Acute alcoholic pancreatitis: With large pancreatic fluid collection/pseudocyst status post aspiration by IR on 07/31/2021 with  removal of 1.3 L of dark brown fluid, patient feeling some better this morning 2.  Abdominal distention: With above, KUB 07/29/2021 unremarkable 3.  AMS in the setting of alcohol withdrawal: Mental status improving 4.  Fatty liver 5.  Hypokalemia  Plan: 1.  Continue supportive measures 2.  MRI at some point in the future for further evaluation of abnormal CT findings, if she gets to the point during this hospitalization that she could possibly hold her breath for that exam that would recommend being done before she leaves. 3.  I have gone ahead and scheduled patient for follow-up in our clinic in the next 4 to 6 weeks.  Thank you for your kind consultation.   LOS: 8 days   Levin Erp  08/01/2021, 10:37 AM     [facility] Attending   I have taken an interval history, reviewed the chart and examined the patient. I agree with the Advanced Practitioner's note, impression and recommendations with the following additional comments:  Having some respiratory distress today could be volume overload?  I think it would be best if we can get the MRI before she leaves the hospital although that may not be possible.  Daughter was present today when I was in the room and we reviewed the plan with the patient.  She is certainly not ready to have an MRI given her current state.   I do think she can advance diet I will try full liquids.  I am fearful that the studies obtained with cyst aspiration or not performed based upon their status in the lab results section.  Do not anticipate rounding on her over the weekend but we will regroup on Monday.  We have gone ahead and scheduled an outpatient appointment for her in anticipation of that needed follow-up.  August 18 at 9:30 AM.  Gatha Mayer, MD, [facility] See Shea Evans on call - gastroenterology for best contact person 08/01/2021 12:09 PM

## 2021-08-01 NOTE — Progress Notes (Signed)
[facility] PROGRESS NOTE   Kathryn Underwood TFT:[id number] DOB: Jun 19, 1960 DOA: 07/24/2021  PCP: Fredirick Lathe, PA-C  Brief History/Interval Summary: 61 year old female with past medical history of anxiety, depression, GERD, alcohol use drinks 1 wine bottle per day presented with abdominal pain ongoing for 2 weeks. Found to have acute pancreatitis as well as pancreatic cyst.  To be transferred to the ICU due to oversedation.  Subsequently transferred back out.   Consultants: Gastroenterology.  Interventional radiology  Procedures: None   Subjective/Interval History: Notified by RN this morning that patient was tachycardic and tachypneic.  Seen immediately at bedside.  She does report difficulty breathing.  She does have a history of smoking cigarettes.  Denies any chest pain.  No nausea vomiting.  Mentation has improved.  Denies any abdominal pain.     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  Patient seems to be stable for the most part.  Abdominal CT scan was repeated on 7/2 which showed evidence of pancreatic hemorrhage and lesion along with worsening cirrhosis. Interventional radiology was consulted.  Patient underwent aspiration of the peripancreatic fluid collection on 7/6.  1300 mL of fluid was removed. Seems to be stable this morning from a GI standpoint.  Noted to be on clear liquid diet  Acute metabolic encephalopathy Likely due to delirium from alcohol withdrawal.  Patient has been on CIWA protocol.  Became quite encephalopathic requiring restraints.  Was given Haldol yesterday.  Improvement in mentation noted this morning.  More cooperative.  Continue current management for now.  No focal neurological deficits noted on examination.  Alcohol dependence Has been counseled.  See above.  On CIWA protocol.  Continue thiamine.  Acute hypoxic and hypercapnic respiratory failure Likely due to  combination of hypoventilation from abdominal pain along with delirium from alcohol withdrawal and intravenous agents including benzodiazepines and pain medications. Patient was transferred to the ICU.  Monitored closely.  Did not require intubation.  Received IV Narcan.  Transferred back up to the floor.  Patient had been stable on 4 L of oxygen by nasal cannula.  Try to wean down as tolerated. This morning noted to be tachypneic and tachycardic.  Noted to have wheezing on examination.  Very poor air entry.  Chest x-ray was done which shows findings suggestive of atelectasis and a small right pleural effusion.  IV fluids will be stopped.  Incentive spirometry.  She was given treatments with improvement in symptoms.  Patient was reevaluated after an hour.  Heart rate has improved.  She feels better.  Thrombosis of splenic artery We will continue to monitor.  Likely related to pancreatitis.  No anticoagulation due to concern for pancreatic hemorrhage.  Hypokalemia Continue to supplement potassium.  Magnesium 1.7.  History of COPD Stable.  See above.  Continue nebulizer treatments.  Hold off on steroids unless she has recurrent symptoms  Left pulmonary nodule Will require outpatient surveillance.  Uterine fibroid Incidentally noted.  Outpatient follow-up.  History of depression and anxiety Home medications including Seroquel Remeron gabapentin and eszopiclone on hold.  DVT Prophylaxis: SCDs.  No anticoagulation due to pancreatic hemorrhage Code Status: Full code Family Communication: Discussed with patient.  No family at bedside Disposition Plan: Hopefully return home when improved.  Start mobilizing.  Status is: Inpatient Remains inpatient appropriate because: Delirium, pancreatitis    Medications: Scheduled:  Chlorhexidine Gluconate Cloth  6 each Topical Daily   folic acid  1 mg Oral Daily   ipratropium  0.5 mg Nebulization  QID   levalbuterol  0.63 mg Nebulization QID   mouth  rinse  15 mL Mouth Rinse 4 times per day   pantoprazole  40 mg Oral Q0600   thiamine injection  100 mg Intravenous Daily   Continuous:  potassium chloride 10 mEq (08/01/21 0834)   HYQ:MVHQIONGEXB lactate, levalbuterol, LORazepam **OR** LORazepam, morphine injection, naloxone, ondansetron **OR** ondansetron (ZOFRAN) IV, mouth rinse, oxyCODONE  Antibiotics: Anti-infectives (From admission, onward)    Start     Dose/Rate Route Frequency Ordered Stop   07/24/21 2230  cefTRIAXone (ROCEPHIN) 1 g in sodium chloride 0.9 % 100 mL IVPB  Status:  Discontinued        1 g 200 mL/hr over 30 Minutes Intravenous Every 24 hours 07/24/21 2130 07/27/21 0729   07/24/21 2230  azithromycin (ZITHROMAX) 500 mg in sodium chloride 0.9 % 250 mL IVPB  Status:  Discontinued        500 mg 250 mL/hr over 60 Minutes Intravenous Every 24 hours 07/24/21 2130 07/26/21 1415       Objective:  Vital Signs  Vitals:   08/01/21 0004 08/01/21 0300 08/01/21 0758 08/01/21 0900  BP: 123/79 114/74 125/77 (!) 116/59  Pulse: 93 100 (!) 129 (!) 103  Resp: 20 (!) 25 (!) 35 (!) 22  Temp: 98.9 F (37.2 C) 98.2 F (36.8 C)    TempSrc: Oral Oral    SpO2: 100% 98% 97% 97%  Weight:      Height:        Intake/Output Summary (Last 24 hours) at 08/01/2021 1014 Last data filed at 07/31/2021 1800 Gross per 24 hour  Intake 480 ml  Output 1300 ml  Net -820 ml    Filed Weights   07/26/21 0411 07/28/21 1422 07/29/21 0500  Weight: 70.4 kg 72.5 kg 73 kg    General appearance: Awake alert.  In no distress Resp: Tachypneic.  Very diminished air entry throughout the lungs with wheezing appreciated bilaterally.  Few crackles at the bases. Cardio: S1-S2 is normal regular.  No S3-S4.  No rubs murmurs or bruit GI: Abdomen is soft.  Nontender nondistended.  Bowel sounds are present normal.  No masses organomegaly Extremities: No edema.  Full range of motion of lower extremities. Neurologic: Mentation has improved.  More oriented today.   No focal neurological deficits.      Lab Results:  Data Reviewed: I have personally reviewed following labs and reports of the imaging studies  CBC: Recent Labs  Lab 07/27/21 0009 07/28/21 0417 07/29/21 0249 07/30/21 0148 07/31/21 0207 08/01/21 0039  WBC 13.2* 13.5* 12.2* 14.4* 12.5* 9.4  NEUTROABS 10.1* 10.9*  --  12.0* 9.4* 6.6  HGB 12.5 12.5 12.4 12.9 13.0 12.3  HCT 39.1 41.3 37.8 39.8 40.8 38.2  MCV 108.3* 112.2* 109.9* 108.2* 107.9* 107.6*  PLT 569* 511* 442* 477* 501* 482*     Basic Metabolic Panel: Recent Labs  Lab 07/28/21 0417 07/29/21 0249 07/29/21 1857 07/30/21 0148 07/31/21 0207 08/01/21 0039  NA 139 139 140 140 142 139  K 3.8 3.3* 3.5 3.1* 3.2* 3.4*  CL 101 103 99 101 105 103  CO2 31 32 '27 26 28 27  '$ GLUCOSE 127* 86 82 77 77 78  BUN 7* 9 10 7* 5* <5*  CREATININE 0.49 0.41* 0.52 0.50 0.50 0.58  CALCIUM 8.4* 8.1* 8.3* 8.1* 8.1* 7.9*  MG 1.8  --  2.0 1.8 1.9 1.7     GFR: Estimated Creatinine Clearance: 69.8 mL/min (by C-G formula based on SCr  of 0.58 mg/dL).  Liver Function Tests: Recent Labs  Lab 07/28/21 0417 07/29/21 0249 07/30/21 0148 07/31/21 0207 08/01/21 0039  AST '22 18 19 21 21  '$ ALT '12 14 13 13 12  '$ ALKPHOS 92 73 80 78 64  BILITOT 0.6 0.5 1.2 0.7 0.8  PROT 4.6* 4.3* 4.4* 4.6* 4.2*  ALBUMIN 2.4* 2.2* 2.2* 2.3* 2.1*     Recent Labs  Lab 07/26/21 0619  LIPASE 129*    CBG: Recent Labs  Lab 07/28/21 1906 07/28/21 2300 07/29/21 0302 07/29/21 0743 08/01/21 0835  GLUCAP 103* 77 86 88 81      Recent Results (from the past 240 hour(s))  SARS Coronavirus 2 by RT PCR (hospital order, performed in [place] hospital lab) *cepheid single result test* Anterior Nasal Swab     Status: None   Collection Time: 07/24/21  9:05 AM   Specimen: Anterior Nasal Swab  Result Value Ref Range Status   SARS Coronavirus 2 by RT PCR NEGATIVE NEGATIVE Final    Comment: (NOTE) SARS-CoV-2 target nucleic acids are NOT DETECTED.  The  SARS-CoV-2 RNA is generally detectable in upper and lower respiratory specimens during the acute phase of infection. The lowest concentration of SARS-CoV-2 viral copies this assay can detect is 250 copies / mL. A negative result does not preclude SARS-CoV-2 infection and should not be used as the sole basis for treatment or other patient management decisions.  A negative result may occur with improper specimen collection / handling, submission of specimen other than nasopharyngeal swab, presence of viral mutation(s) within the areas targeted by this assay, and inadequate number of viral copies (<250 copies / mL). A negative result must be combined with clinical observations, patient history, and epidemiological information.  Fact Sheet for Patients:   https://www.patel.info/  Fact Sheet for Healthcare Providers: https://hall.com/  This test is not yet approved or  cleared by the [place] FDA and has been authorized for detection and/or diagnosis of SARS-CoV-2 by FDA under an Emergency Use Authorization (EUA).  This EUA will remain in effect (meaning this test can be used) for the duration of the COVID-19 declaration under Section 564(b)(1) of the Act, 21 U.S.C. section 360bbb-3(b)(1), unless the authorization is terminated or revoked sooner.  Performed at [facility], [address], [place], [place] [address]   MRSA Next Gen by PCR, Nasal     Status: None   Collection Time: 07/28/21  2:25 PM   Specimen: Nasal Mucosa; Nasal Swab  Result Value Ref Range Status   MRSA by PCR Next Gen NOT DETECTED NOT DETECTED Final    Comment: (NOTE) The GeneXpert MRSA Assay (FDA approved for NASAL specimens only), is one component of a comprehensive MRSA colonization surveillance program. It is not intended to diagnose MRSA infection nor to guide or monitor treatment for MRSA infections. Test performance is not FDA approved in  patients less than 58 years old. Performed at [facility], [place] [address], [place], [place] [address]       Radiology Studies: DG CHEST PORT 1 VIEW  Result Date: 08/01/2021 CLINICAL DATA:  Dyspnea EXAM: PORTABLE CHEST 1 VIEW COMPARISON:  07/29/2021 FINDINGS: Patchy bibasilar atelectasis/consolidation. Small right pleural effusion. Heart size is normal for technique. IMPRESSION: Patchy bibasilar atelectasis/consolidation. Small right pleural effusion. Electronically Signed   By: Macy Mis M.D.   On: 08/01/2021 08:18   CT ASPIRATION  Result Date: 07/31/2021 INDICATION: 61 year old woman with peripancreatic fluid collection presents to IR for CT-guided aspiration. EXAM:  CT guided peripancreatic fluid drainage MEDICATIONS: The patient is currently admitted to the hospital and receiving intravenous antibiotics. The antibiotics were administered within an appropriate time frame prior to the initiation of the procedure. ANESTHESIA/SEDATION: 0.5 mg IV Versed Moderate Sedation Time:  13 minutes The patient was continuously monitored during the procedure by the interventional radiology nurse under my direct supervision. COMPLICATIONS: None immediate. TECHNIQUE: Informed written consent was obtained from the patient after a thorough discussion of the procedural risks, benefits and alternatives. All questions were addressed. Maximal Sterile Barrier Technique was utilized including caps, mask, sterile gowns, sterile gloves, sterile drape, hand hygiene and skin antiseptic. A timeout was performed prior to the initiation of the procedure. PROCEDURE: The left anterolateral abdominal wall was prepped with Chlorhexidine in a sterile fashion, and a sterile drape was applied covering the operative field. Sterile gown and sterile gloves were used for the procedure. Local anesthesia was provided with 1% Lidocaine. Utilizing CT guidance 19 ga Yueh needle was inserted into the peripancreatic fluid collection. 1.3 L  of dark brown fluid was removed. Post aspiration CT demonstrated significant reduction in size of fluid collection with some fluid still remaining adjacent to the greater curvature of the stomach. Samples were sent for gram stain, culture, cytology and amylase. IMPRESSION: CT-guided aspiration of peripancreatic fluid collection. Electronically Signed   By: Miachel Roux M.D.   On: 07/31/2021 16:55       LOS: 8 days   [facility] Pager on [email or web address]  08/01/2021, 10:14 AM

## 2021-08-01 NOTE — H&P (View-Only) (Signed)
Progress Note   Subjective  Chief Complaint: Alcoholic pancreatitis large pseudocyst  Patient is slightly more awake this morning, though wanting to fall back asleep during time my exam, does tell me that her abdominal pain feels some better after the removal of fluid.  Denies any other acute complaints or concerns.   Objective   Vital signs in last 24 hours: Temp:  [97.6 F (36.4 C)-98.9 F (37.2 C)] 98.2 F (36.8 C) (07/07 0300) Pulse Rate:  [83-129] 103 (07/07 0900) Resp:  [17-35] 22 (07/07 0900) BP: (113-125)/(59-79) 116/59 (07/07 0900) SpO2:  [94 %-100 %] 97 % (07/07 0900) Last BM Date : 07/31/21 General:    white female in NAD, lethargic Heart:  Regular rate and rhythm; no murmurs Lungs: Respirations even and unlabored, lungs CTA bilaterally Abdomen:  Soft, mild epigastric TTP and nondistended. Normal bowel sounds. Psych:  Cooperative.  Lethargic  Intake/Output from previous day: 07/06 0701 - 07/07 0700 In: 480 [P.O.:480] Out: 1300    Lab Results: Recent Labs    07/30/21 0148 07/31/21 0207 08/01/21 0039  WBC 14.4* 12.5* 9.4  HGB 12.9 13.0 12.3  HCT 39.8 40.8 38.2  PLT 477* 501* 482*   BMET Recent Labs    07/30/21 0148 07/31/21 0207 08/01/21 0039  NA 140 142 139  K 3.1* 3.2* 3.4*  CL 101 105 103  CO2 '26 28 27  '$ GLUCOSE 77 77 78  BUN 7* 5* <5*  CREATININE 0.50 0.50 0.58  CALCIUM 8.1* 8.1* 7.9*   LFT Recent Labs    08/01/21 0039  PROT 4.2*  ALBUMIN 2.1*  AST 21  ALT 12  ALKPHOS 64  BILITOT 0.8   Studies/Results: DG CHEST PORT 1 VIEW  Result Date: 08/01/2021 CLINICAL DATA:  Dyspnea EXAM: PORTABLE CHEST 1 VIEW COMPARISON:  07/29/2021 FINDINGS: Patchy bibasilar atelectasis/consolidation. Small right pleural effusion. Heart size is normal for technique. IMPRESSION: Patchy bibasilar atelectasis/consolidation. Small right pleural effusion. Electronically Signed   By: Macy Mis M.D.   On: 08/01/2021 08:18   CT ASPIRATION  Result Date:  07/31/2021 INDICATION: 61 year old woman with peripancreatic fluid collection presents to IR for CT-guided aspiration. EXAM: CT guided peripancreatic fluid drainage MEDICATIONS: The patient is currently admitted to the hospital and receiving intravenous antibiotics. The antibiotics were administered within an appropriate time frame prior to the initiation of the procedure. ANESTHESIA/SEDATION: 0.5 mg IV Versed Moderate Sedation Time:  13 minutes The patient was continuously monitored during the procedure by the interventional radiology nurse under my direct supervision. COMPLICATIONS: None immediate. TECHNIQUE: Informed written consent was obtained from the patient after a thorough discussion of the procedural risks, benefits and alternatives. All questions were addressed. Maximal Sterile Barrier Technique was utilized including caps, mask, sterile gowns, sterile gloves, sterile drape, hand hygiene and skin antiseptic. A timeout was performed prior to the initiation of the procedure. PROCEDURE: The left anterolateral abdominal wall was prepped with Chlorhexidine in a sterile fashion, and a sterile drape was applied covering the operative field. Sterile gown and sterile gloves were used for the procedure. Local anesthesia was provided with 1% Lidocaine. Utilizing CT guidance 19 ga Yueh needle was inserted into the peripancreatic fluid collection. 1.3 L of dark brown fluid was removed. Post aspiration CT demonstrated significant reduction in size of fluid collection with some fluid still remaining adjacent to the greater curvature of the stomach. Samples were sent for gram stain, culture, cytology and amylase. IMPRESSION: CT-guided aspiration of peripancreatic fluid collection. Electronically Signed   By: Sharen Heck  Mir M.D.   On: 07/31/2021 16:55       Assessment / Plan:   Assessment: 1.  Acute alcoholic pancreatitis: With large pancreatic fluid collection/pseudocyst status post aspiration by IR on 07/31/2021 with  removal of 1.3 L of dark brown fluid, patient feeling some better this morning 2.  Abdominal distention: With above, KUB 07/29/2021 unremarkable 3.  AMS in the setting of alcohol withdrawal: Mental status improving 4.  Fatty liver 5.  Hypokalemia  Plan: 1.  Continue supportive measures 2.  MRI at some point in the future for further evaluation of abnormal CT findings, if she gets to the point during this hospitalization that she could possibly hold her breath for that exam that would recommend being done before she leaves. 3.  I have gone ahead and scheduled patient for follow-up in our clinic in the next 4 to 6 weeks.  Thank you for your kind consultation.   LOS: 8 days   Levin Erp  08/01/2021, 10:37 AM     [facility] Attending   I have taken an interval history, reviewed the chart and examined the patient. I agree with the Advanced Practitioner's note, impression and recommendations with the following additional comments:  Having some respiratory distress today could be volume overload?  I think it would be best if we can get the MRI before she leaves the hospital although that may not be possible.  Daughter was present today when I was in the room and we reviewed the plan with the patient.  She is certainly not ready to have an MRI given her current state.   I do think she can advance diet I will try full liquids.  I am fearful that the studies obtained with cyst aspiration or not performed based upon their status in the lab results section.  Do not anticipate rounding on her over the weekend but we will regroup on Monday.  We have gone ahead and scheduled an outpatient appointment for her in anticipation of that needed follow-up.  August 18 at 9:30 AM.  Gatha Mayer, MD, [facility] See Shea Evans on call - gastroenterology for best contact person 08/01/2021 12:09 PM

## 2021-08-01 NOTE — Telephone Encounter (Signed)
Patient has been scheduled for a 6-week hospital follow up appointment with Tye Savoy, NP on Friday, 09/12/21 ata 9:30 am. Appt information mailed to patient. Appt information will also be available on hospital discharge paperwork.

## 2021-08-02 LAB — BASIC METABOLIC PANEL
Anion gap: 10 (ref 5–15)
BUN: <5 mg/dL — ABNORMAL LOW (ref 8–23)
CO2: 30 mmol/L (ref 22–32)
Calcium: 8 mg/dL — ABNORMAL LOW (ref 8.9–10.3)
Chloride: 100 mmol/L (ref 98–111)
Creatinine, Ser: 0.41 mg/dL — ABNORMAL LOW (ref 0.44–1.00)
GFR, Estimated: >60 mL/min (ref >60)
Glucose, Bld: 104 mg/dL — ABNORMAL HIGH (ref 70–99)
Potassium: 3.5 mmol/L (ref 3.5–5.1)
Sodium: 140 mmol/L (ref 135–145)

## 2021-08-02 MED ORDER — SACCHAROMYCES BOULARDII 250 MG PO CAPS
250.0000 mg | ORAL_CAPSULE | Freq: Two times a day (BID) | ORAL | Status: DC
  Administered 2021-08-02 – 2021-08-06 (×8): 250 mg via ORAL
  Filled 2021-08-02 (×8): qty 1

## 2021-08-02 MED ORDER — THIAMINE HCL 100 MG PO TABS
100.0000 mg | ORAL_TABLET | Freq: Every day | ORAL | Status: DC
  Administered 2021-08-03 – 2021-08-06 (×4): 100 mg via ORAL
  Filled 2021-08-02 (×4): qty 1

## 2021-08-02 MED ORDER — POTASSIUM CHLORIDE CRYS ER 20 MEQ PO TBCR
40.0000 meq | EXTENDED_RELEASE_TABLET | Freq: Once | ORAL | Status: AC
  Administered 2021-08-02: 40 meq via ORAL
  Filled 2021-08-02: qty 2

## 2021-08-02 MED ORDER — VALACYCLOVIR HCL 500 MG PO TABS
1000.0000 mg | ORAL_TABLET | Freq: Every day | ORAL | Status: DC
Start: 2021-08-02 — End: 2021-08-06
  Administered 2021-08-02 – 2021-08-06 (×5): 1000 mg via ORAL
  Filled 2021-08-02 (×6): qty 2

## 2021-08-02 MED ORDER — LOPERAMIDE HCL 2 MG PO CAPS
4.0000 mg | ORAL_CAPSULE | Freq: Three times a day (TID) | ORAL | Status: DC | PRN
  Administered 2021-08-02 – 2021-08-03 (×2): 4 mg via ORAL
  Filled 2021-08-02 (×2): qty 2

## 2021-08-02 MED ORDER — GERHARDT'S BUTT CREAM
TOPICAL_CREAM | Freq: Three times a day (TID) | CUTANEOUS | Status: DC
  Administered 2021-08-02 – 2021-08-05 (×2): 1 via TOPICAL
  Filled 2021-08-02: qty 1

## 2021-08-02 NOTE — Progress Notes (Signed)
[facility] PROGRESS NOTE   ITZAYANNA KASTER AOZ:[id number] DOB: April 17, 1960 DOA: 07/24/2021  PCP: Fredirick Lathe, PA-C  Brief History/Interval Summary: 61 year old female with past medical history of anxiety, depression, GERD, alcohol use drinks 1 wine bottle per day presented with abdominal pain ongoing for 2 weeks. Found to have acute pancreatitis as well as pancreatic cyst.  To be transferred to the ICU due to oversedation.  Subsequently transferred back out.   Consultants: Gastroenterology.  Interventional radiology  Procedures: None   Subjective/Interval History: Patient mentions that she is feeling better.  However was unable to sleep much last night.  Denies any nausea vomiting.  Abdominal discomfort has improved.  Diarrhea seems to be slowing down.  Tolerating her liquid diet.     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  Patient seems to be stable for the most part.  Abdominal CT scan was repeated on 7/2 which showed evidence of pancreatic hemorrhage and lesion along with worsening cirrhosis. Interventional radiology was consulted.  Patient underwent aspiration of the peripancreatic fluid collection on 7/6.  1300 mL of fluid was removed. Seems to be stable from a GI standpoint.  No nausea vomiting.  Diarrhea appears to be slowing down.  Diarrhea is most likely due to pancreatitis. Will advance her to soft diet today.  Acute metabolic encephalopathy Likely due to delirium from alcohol withdrawal.  Patient has been on CIWA protocol.  Became quite encephalopathic requiring restraints.  Was given Haldol.   Patient has improved and seems to be back to baseline now.    Alcohol dependence Has been counseled.  See above.  On CIWA protocol.  Continue thiamine.  Acute hypoxic and hypercapnic respiratory failure Likely due to combination of hypoventilation from abdominal pain along with delirium  from alcohol withdrawal and intravenous agents including benzodiazepines and pain medications. Patient was transferred to the ICU.  Monitored closely.  Did not require intubation.  Received IV Narcan.  Transferred back up to the floor. Respiratory status is stable today.  Continue to wean down oxygen to maintain sats greater than 90%.  Incentive spirometry.  History of COPD Noted to have wheezing yesterday morning.  She was tachypneic and tachycardic.  Chest x-ray showed atelectasis and small right pleural effusion.  IV fluids were discontinued.  She was given nebulizer treatment with improvement in symptoms.  Continue to monitor.  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  No anticoagulation due to concern for pancreatic hemorrhage.  Hypokalemia Potassium level has improved.  Continue to supplement.  Left pulmonary nodule Will require outpatient surveillance.  Uterine fibroid Incidentally noted.  Outpatient follow-up.  History of depression and anxiety Home medications including Seroquel Remeron gabapentin and eszopiclone on hold.  History of herpes Takes Valtrex as needed.  DVT Prophylaxis: SCDs.  No anticoagulation due to pancreatic hemorrhage Code Status: Full code Family Communication: Discussed with patient.  No family at bedside Disposition Plan: Hopefully return home when improved.  Start mobilizing.  Status is: Inpatient Remains inpatient appropriate because: Delirium, pancreatitis    Medications: Scheduled:  Chlorhexidine Gluconate Cloth  6 each Topical Daily   folic acid  1 mg Oral Daily   ipratropium  0.5 mg Nebulization QID   levalbuterol  0.63 mg Nebulization QID   mouth rinse  15 mL Mouth Rinse 4 times per day   pantoprazole  40 mg Oral Q0600   thiamine injection  100 mg Intravenous Daily   Continuous:   NGE:[id number] lactate, levalbuterol, morphine injection, naloxone,  ondansetron **OR** ondansetron (ZOFRAN) IV, mouth rinse,  oxyCODONE  Antibiotics: Anti-infectives (From admission, onward)    Start     Dose/Rate Route Frequency Ordered Stop   07/24/21 2230  cefTRIAXone (ROCEPHIN) 1 g in sodium chloride 0.9 % 100 mL IVPB  Status:  Discontinued        1 g 200 mL/hr over 30 Minutes Intravenous Every 24 hours 07/24/21 2130 07/27/21 0729   07/24/21 2230  azithromycin (ZITHROMAX) 500 mg in sodium chloride 0.9 % 250 mL IVPB  Status:  Discontinued        500 mg 250 mL/hr over 60 Minutes Intravenous Every 24 hours 07/24/21 2130 07/26/21 1415       Objective:  Vital Signs  Vitals:   08/01/21 2333 08/02/21 0407 08/02/21 0555 08/02/21 0726  BP: 105/63 101/60  118/70  Pulse: (!) 104 99  (!) 107  Resp: '20 20  19  '$ Temp: 99.1 F (37.3 C) 99.3 F (37.4 C)  98.6 F (37 C)  TempSrc: Oral Oral  Oral  SpO2: 91% 92%  94%  Weight:   70.6 kg   Height:        Intake/Output Summary (Last 24 hours) at 08/02/2021 1029 Last data filed at 08/01/2021 1953 Gross per 24 hour  Intake 120 ml  Output 1200 ml  Net -1080 ml    Filed Weights   07/28/21 1422 07/29/21 0500 08/02/21 0555  Weight: 72.5 kg 73 kg 70.6 kg    General appearance: Awake alert.  In no distress Resp: Improved effort.  Much improved air entry bilaterally.  No wheezing heard today.  Few crackles at the bases. Cardio: S1-S2 is normal regular.  No S3-S4.  No rubs murmurs or bruit GI: Abdomen is soft.  Nontender nondistended.  Bowel sounds are present normal.  No masses organomegaly Extremities: No edema.  Full range of motion of lower extremities. Neurologic: Alert and oriented x3.  No focal neurological deficits.     Lab Results:  Data Reviewed: I have personally reviewed following labs and reports of the imaging studies  CBC: Recent Labs  Lab 07/27/21 0009 07/28/21 0417 07/29/21 0249 07/30/21 0148 07/31/21 0207 08/01/21 0039  WBC 13.2* 13.5* 12.2* 14.4* 12.5* 9.4  NEUTROABS 10.1* 10.9*  --  12.0* 9.4* 6.6  HGB 12.5 12.5 12.4 12.9 13.0 12.3   HCT 39.1 41.3 37.8 39.8 40.8 38.2  MCV 108.3* 112.2* 109.9* 108.2* 107.9* 107.6*  PLT 569* 511* 442* 477* 501* 482*     Basic Metabolic Panel: Recent Labs  Lab 07/28/21 0417 07/29/21 0249 07/29/21 1857 07/30/21 0148 07/31/21 0207 08/01/21 0039 08/02/21 0050  NA 139   < > 140 140 142 139 140  K 3.8   < > 3.5 3.1* 3.2* 3.4* 3.5  CL 101   < > 99 101 105 103 100  CO2 31   < > '27 26 28 27 30  '$ GLUCOSE 127*   < > 82 77 77 78 104*  BUN 7*   < > 10 7* 5* <5* <5*  CREATININE 0.49   < > 0.52 0.50 0.50 0.58 0.41*  CALCIUM 8.4*   < > 8.3* 8.1* 8.1* 7.9* 8.0*  MG 1.8  --  2.0 1.8 1.9 1.7  --    < > = values in this interval not displayed.     GFR: Estimated Creatinine Clearance: 68.7 mL/min (A) (by C-G formula based on SCr of 0.41 mg/dL (L)).  Liver Function Tests: Recent Labs  Lab 07/28/21 0417 07/29/21  9371 07/30/21 0148 07/31/21 0207 08/01/21 0039  AST '22 18 19 21 21  '$ ALT '12 14 13 13 12  '$ ALKPHOS 92 73 80 78 64  BILITOT 0.6 0.5 1.2 0.7 0.8  PROT 4.6* 4.3* 4.4* 4.6* 4.2*  ALBUMIN 2.4* 2.2* 2.2* 2.3* 2.1*      CBG: Recent Labs  Lab 07/28/21 1906 07/28/21 2300 07/29/21 0302 07/29/21 0743 08/01/21 0835  GLUCAP 103* 77 86 88 81      Recent Results (from the past 240 hour(s))  SARS Coronavirus 2 by RT PCR (hospital order, performed in [place] hospital lab) *cepheid single result test* Anterior Nasal Swab     Status: None   Collection Time: 07/24/21  9:05 AM   Specimen: Anterior Nasal Swab  Result Value Ref Range Status   SARS Coronavirus 2 by RT PCR NEGATIVE NEGATIVE Final    Comment: (NOTE) SARS-CoV-2 target nucleic acids are NOT DETECTED.  The SARS-CoV-2 RNA is generally detectable in upper and lower respiratory specimens during the acute phase of infection. The lowest concentration of SARS-CoV-2 viral copies this assay can detect is 250 copies / mL. A negative result does not preclude SARS-CoV-2 infection and should not be used as the sole basis for  treatment or other patient management decisions.  A negative result may occur with improper specimen collection / handling, submission of specimen other than nasopharyngeal swab, presence of viral mutation(s) within the areas targeted by this assay, and inadequate number of viral copies (<250 copies / mL). A negative result must be combined with clinical observations, patient history, and epidemiological information.  Fact Sheet for Patients:   https://www.patel.info/  Fact Sheet for Healthcare Providers: https://hall.com/  This test is not yet approved or  cleared by the [place] FDA and has been authorized for detection and/or diagnosis of SARS-CoV-2 by FDA under an Emergency Use Authorization (EUA).  This EUA will remain in effect (meaning this test can be used) for the duration of the COVID-19 declaration under Section 564(b)(1) of the Act, 21 U.S.C. section 360bbb-3(b)(1), unless the authorization is terminated or revoked sooner.  Performed at [facility], [address], [place], [place] [address]   MRSA Next Gen by PCR, Nasal     Status: None   Collection Time: 07/28/21  2:25 PM   Specimen: Nasal Mucosa; Nasal Swab  Result Value Ref Range Status   MRSA by PCR Next Gen NOT DETECTED NOT DETECTED Final    Comment: (NOTE) The GeneXpert MRSA Assay (FDA approved for NASAL specimens only), is one component of a comprehensive MRSA colonization surveillance program. It is not intended to diagnose MRSA infection nor to guide or monitor treatment for MRSA infections. Test performance is not FDA approved in patients less than 33 years old. Performed at [facility], [place] [address] [place],  [address]       Radiology Studies: DG CHEST PORT 1 VIEW  Result Date: 08/01/2021 CLINICAL DATA:  Dyspnea EXAM: PORTABLE CHEST 1 VIEW COMPARISON:  07/29/2021 FINDINGS: Patchy bibasilar atelectasis/consolidation.  Small right pleural effusion. Heart size is normal for technique. IMPRESSION: Patchy bibasilar atelectasis/consolidation. Small right pleural effusion. Electronically Signed   By: Macy Mis M.D.   On: 08/01/2021 08:18   CT ASPIRATION  Result Date: 07/31/2021 INDICATION: 61 year old woman with peripancreatic fluid collection presents to IR for CT-guided aspiration. EXAM: CT guided peripancreatic fluid drainage MEDICATIONS: The patient is currently admitted to the hospital and receiving intravenous antibiotics. The antibiotics were administered within an appropriate  time frame prior to the initiation of the procedure. ANESTHESIA/SEDATION: 0.5 mg IV Versed Moderate Sedation Time:  13 minutes The patient was continuously monitored during the procedure by the interventional radiology nurse under my direct supervision. COMPLICATIONS: None immediate. TECHNIQUE: Informed written consent was obtained from the patient after a thorough discussion of the procedural risks, benefits and alternatives. All questions were addressed. Maximal Sterile Barrier Technique was utilized including caps, mask, sterile gowns, sterile gloves, sterile drape, hand hygiene and skin antiseptic. A timeout was performed prior to the initiation of the procedure. PROCEDURE: The left anterolateral abdominal wall was prepped with Chlorhexidine in a sterile fashion, and a sterile drape was applied covering the operative field. Sterile gown and sterile gloves were used for the procedure. Local anesthesia was provided with 1% Lidocaine. Utilizing CT guidance 19 ga Yueh needle was inserted into the peripancreatic fluid collection. 1.3 L of dark brown fluid was removed. Post aspiration CT demonstrated significant reduction in size of fluid collection with some fluid still remaining adjacent to the greater curvature of the stomach. Samples were sent for gram stain, culture, cytology and amylase. IMPRESSION: CT-guided aspiration of peripancreatic fluid  collection. Electronically Signed   By: Miachel Roux M.D.   On: 07/31/2021 16:55       LOS: 9 days   Aimee Heldman [employer] on [email or web address]  08/02/2021, 10:29 AM

## 2021-08-02 NOTE — Evaluation (Signed)
Physical Therapy Evaluation Patient Details Name: Kathryn Underwood MRN: [id number] DOB: April 28, 1960 Today's Date: 08/02/2021  History of Present Illness  61 y/o female presented to ED on 07/24/21 for constipation/abdomen pain/black diarrhea. 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 Underwent ETOH withdrawal while admitted. S/p peri-pancreatic fluid drainage on 7/6. PMH: alcohol dependence, COPD, hx of herpes  Clinical Impression  Patient admitted with the above. PTA, patient lives with boyfriend and was independent. Patient presents with weakness, impaired balance, and decreased activity tolerance. Patient requiring minA for bed mobility and sit to stand transfer. Ambulated in hallway with min guard and HHAx1 but demos very guarded slow gait pattern with no overt LOB. HR up to 140s during mobility. Encouraged patient to ambulate with nursing staff and mobility specialists to improve activity tolerance. Patient will benefit from skilled PT services during acute stay to address listed deficits. Recommend HHPT at discharge to maximize functional independence and safety.        Recommendations for follow up therapy are one component of a multi-disciplinary discharge planning process, led by the attending physician.  Recommendations may be updated based on patient status, additional functional criteria and insurance authorization.  Follow Up Recommendations Home health PT      Assistance Recommended at Discharge Intermittent Supervision/Assistance  Patient can return home with the following  A little help with walking and/or transfers;A little help with bathing/dressing/bathroom;Assistance with cooking/housework;Assist for transportation;Help with stairs or ramp for entrance    Equipment Recommendations Other (comment) (TBD)  Recommendations for Other Services       Functional Status Assessment Patient has had a recent decline in their functional status and demonstrates the  ability to make significant improvements in function in a reasonable and predictable amount of time.     Precautions / Restrictions Precautions Precautions: Fall Precaution Comments: watch HR Restrictions Weight Bearing Restrictions: No      Mobility  Bed Mobility Overal bed mobility: Needs Assistance Bed Mobility: Supine to Sit, Sit to Supine     Supine to sit: Min assist Sit to supine: Min guard   General bed mobility comments: assist for trunk elevation with patient reaching out for therapist's hand    Transfers Overall transfer level: Needs assistance Equipment used: 1 person hand held assist Transfers: Sit to/from Stand Sit to Stand: Min assist           General transfer comment: assist to rise and steady    Ambulation/Gait Ambulation/Gait assistance: Min guard Gait Distance (Feet): 110 Feet Assistive device: 1 person hand held assist Gait Pattern/deviations: Step-through pattern, Decreased stride length Gait velocity: decreased     General Gait Details: very hesitant and guarded gait. Reaching for rail in hallway as well as HHAx1. Min guard for safety. No overt LOB. HR up to 140s during mobility  Stairs            Wheelchair Mobility    Modified Rankin (Stroke Patients Only)       Balance Overall balance assessment: Mild deficits observed, not formally tested                                           Pertinent Vitals/Pain Pain Assessment Pain Assessment: Faces Faces Pain Scale: Hurts a little bit Pain Location: abdomen Pain Descriptors / Indicators: Sore Pain Intervention(s): Monitored during session    Home Living Family/patient expects to be discharged to:: Private  residence Living Arrangements: Spouse/significant other Available Help at Discharge: Family Type of Home: House Home Access: Stairs to enter Entrance Stairs-Rails: None Technical brewer of Steps: 2   Home Layout: One level Home Equipment:  Civil engineer, contracting - built in      Prior Function Prior Level of Function : Independent/Modified Independent                     Journalist, newspaper        Extremity/Trunk Assessment   Upper Extremity Assessment Upper Extremity Assessment: Defer to OT evaluation    Lower Extremity Assessment Lower Extremity Assessment: Generalized weakness    Cervical / Trunk Assessment Cervical / Trunk Assessment: Normal  Communication   Communication: No difficulties  Cognition Arousal/Alertness: Awake/alert Behavior During Therapy: WFL for tasks assessed/performed Overall Cognitive Status: No family/caregiver present to determine baseline cognitive functioning                                 General Comments: following commands well. Appropriate throughout session        General Comments      Exercises     Assessment/Plan    PT Assessment Patient needs continued PT services  PT Problem List Decreased strength;Decreased activity tolerance;Decreased balance;Decreased mobility       PT Treatment Interventions DME instruction;Gait training;Stair training;Functional mobility training;Therapeutic activities;Therapeutic exercise;Balance training;Patient/family education    PT Goals (Current goals can be found in the Care Plan section)  Acute Rehab PT Goals Patient Stated Goal: to get stronger PT Goal Formulation: With patient Time For Goal Achievement: 08/16/21 Potential to Achieve Goals: Good    Frequency Min 3X/week     Co-evaluation               AM-PAC PT "6 Clicks" Mobility  Outcome Measure Help needed turning from your back to your side while in a flat bed without using bedrails?: A Little Help needed moving from lying on your back to sitting on the side of a flat bed without using bedrails?: A Little Help needed moving to and from a bed to a chair (including a wheelchair)?: A Little Help needed standing up from a chair using your arms (e.g.,  wheelchair or bedside chair)?: A Little Help needed to walk in hospital room?: A Little Help needed climbing 3-5 steps with a railing? : A Little 6 Click Score: 18    End of Session Equipment Utilized During Treatment: Gait belt Activity Tolerance: Patient tolerated treatment well Patient left: in bed;with call bell/phone within reach;with bed alarm set Nurse Communication: Mobility status PT Visit Diagnosis: Unsteadiness on feet (R26.81);Muscle weakness (generalized) (M62.81)    Time: 5277-8242 PT Time Calculation (min) (ACUTE ONLY): 26 min   Charges:   PT Evaluation $PT Eval Moderate Complexity: 1 Mod PT Treatments $Therapeutic Activity: 8-22 mins        Doylene Splinter A. Gilford Rile PT, DPT Acute Rehabilitation Services Office [phone]   Linna Hoff 08/02/2021, 4:43 PM

## 2021-08-02 NOTE — Progress Notes (Signed)
Nurse noted excoriated areas on pt sacral area, some of which had blisters present. Pt stated she has a hx of breakouts in that area and takes valtrex at home as needed. MD notified. Valtrex ordered and pt placed on contact precautions.  Pt groin also very reddened and painful from loose stool and urine incontinence. MD notified and received orders for gerhardts butt cream and imodium PRN.  Raelyn Number, RN

## 2021-08-03 ENCOUNTER — Inpatient Hospital Stay (HOSPITAL_COMMUNITY): Payer: Self-pay

## 2021-08-03 LAB — GLUCOSE, CAPILLARY: Glucose-Capillary: 124 mg/dL — ABNORMAL HIGH (ref 70–99)

## 2021-08-03 MED ORDER — LEVALBUTEROL HCL 0.63 MG/3ML IN NEBU
0.6300 mg | INHALATION_SOLUTION | Freq: Three times a day (TID) | RESPIRATORY_TRACT | Status: DC
  Administered 2021-08-03 – 2021-08-04 (×2): 0.63 mg via RESPIRATORY_TRACT
  Filled 2021-08-03 (×2): qty 3

## 2021-08-03 MED ORDER — TRAZODONE HCL 50 MG PO TABS
50.0000 mg | ORAL_TABLET | Freq: Every evening | ORAL | Status: DC | PRN
Start: 2021-08-03 — End: 2021-08-06
  Administered 2021-08-04 – 2021-08-05 (×3): 50 mg via ORAL
  Filled 2021-08-03 (×3): qty 1

## 2021-08-03 MED ORDER — IPRATROPIUM BROMIDE 0.02 % IN SOLN
0.5000 mg | Freq: Three times a day (TID) | RESPIRATORY_TRACT | Status: DC
  Administered 2021-08-03 – 2021-08-04 (×2): 0.5 mg via RESPIRATORY_TRACT
  Filled 2021-08-03 (×2): qty 2.5

## 2021-08-03 MED ORDER — POTASSIUM CHLORIDE CRYS ER 20 MEQ PO TBCR
40.0000 meq | EXTENDED_RELEASE_TABLET | Freq: Once | ORAL | Status: AC
  Administered 2021-08-03: 40 meq via ORAL
  Filled 2021-08-03: qty 2

## 2021-08-03 NOTE — Progress Notes (Signed)
Patient wanting something for sleep. Text paged Dr. Marlowe Sax  to get her something for sleep. No response.

## 2021-08-03 NOTE — Progress Notes (Signed)
[facility] PROGRESS NOTE   Kathryn Underwood NFA:[id number] DOB: 1960/06/02 DOA: 07/24/2021  PCP: Fredirick Lathe, PA-C  Brief History/Interval Summary: 61 year old female with past medical history of anxiety, depression, GERD, alcohol use drinks 1 wine bottle per day presented with abdominal pain ongoing for 2 weeks. Found to have acute pancreatitis as well as pancreatic cyst.  To be transferred to the ICU due to oversedation.  Subsequently transferred back out.   Consultants: Gastroenterology.  Interventional radiology  Procedures: Aspiration of the peripancreatic fluid collection 7/6   Subjective/Interval History: Patient mentions that overall she feels well.  Getting stronger.  Abdominal pain has improved.  Shortness of breath is better.  Diarrhea seems to be slowing down.  Does have a rash in her lower back consistent with herpes.  She admits to getting these rashes every so often.  Improved with Valtrex.  Tolerated her diet.  Mobilized with physical therapy.     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  Patient seems to be stable for the most part.  Abdominal CT scan was repeated on 7/2 which showed evidence of pancreatic hemorrhage and lesion along with worsening cirrhosis. Interventional radiology was consulted.  Patient underwent aspiration of the peripancreatic fluid collection on 7/6.  1300 mL of fluid was removed. Diarrhea likely due to pancreatitis and reinitiation of diet.  Abdomen is benign.  Imodium as needed.  Probiotics.She is tolerating a soft diet at this time. Abnormal appearing CT was noted as discussed above and for which MRI was recommended.  Due to her tenuous respiratory status and altered mental status that has not been done yet.  Since both of these parameters have improved we will go ahead and order MRI.  This was discussed with gastroenterology who agrees.    Acute metabolic  encephalopathy Likely due to delirium from alcohol withdrawal.  Patient has been on CIWA protocol.  Became quite encephalopathic requiring restraints.  Was given Haldol.   Patient has improved and seems to be back to baseline now.    Alcohol dependence Has been counseled.  See above.  On CIWA protocol.  Continue thiamine.  Acute hypoxic and hypercapnic respiratory failure Likely due to combination of hypoventilation from abdominal pain along with delirium from alcohol withdrawal and intravenous agents including benzodiazepines and pain medications. Patient was transferred to the ICU.  Monitored closely.  Did not require intubation.  Received IV Narcan.  Transferred back up to the floor. Respiratory status has improved.  Now saturating normal on room air.  Continue incentive spirometer.  History of COPD Had mild acute exacerbation few days ago.  She was tachypneic and tachycardic.  Chest x-ray showed atelectasis and small right pleural effusion.  IV fluids were discontinued.  She was given nebulizer treatment with improvement in symptoms.  Did not require steroids.  Continue to monitor.  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  No anticoagulation due to concern for pancreatic hemorrhage.  Hypokalemia Potassium level has improved.  Continue to supplement.  Left pulmonary nodule Will require outpatient surveillance.  Uterine fibroid Incidentally noted.  Outpatient follow-up.  History of depression and anxiety Home medications including Seroquel Remeron gabapentin and eszopiclone on hold.  History of herpes Takes Valtrex as needed.  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  She has a history of same.  Continue with Valtrex at this time.  DVT Prophylaxis: SCDs.  No anticoagulation due to pancreatic hemorrhage Code Status: Full code Family Communication: Discussed with patient.  No family at bedside Disposition Plan: Hopefully return home when  improved.  Seen by physical therapy.  Home health is recommended at discharge.  Status is: Inpatient Remains inpatient appropriate because: Delirium, pancreatitis    Medications: Scheduled:  Chlorhexidine Gluconate Cloth  6 each Topical Daily   folic acid  1 mg Oral Daily   Gerhardt's butt cream   Topical TID   ipratropium  0.5 mg Nebulization QID   levalbuterol  0.63 mg Nebulization QID   mouth rinse  15 mL Mouth Rinse 4 times per day   pantoprazole  40 mg Oral Q0600   saccharomyces boulardii  250 mg Oral BID   thiamine  100 mg Oral Daily   valACYclovir  1,000 mg Oral Daily   Continuous:   TIW:PYKDXIPJASN lactate, levalbuterol, loperamide, morphine injection, naloxone, ondansetron **OR** ondansetron (ZOFRAN) IV, mouth rinse, oxyCODONE, traZODone  Antibiotics: Anti-infectives (From admission, onward)    Start     Dose/Rate Route Frequency Ordered Stop   08/02/21 1130  valACYclovir (VALTREX) tablet 1,000 mg        1,000 mg Oral Daily 08/02/21 1035     07/24/21 2230  cefTRIAXone (ROCEPHIN) 1 g in sodium chloride 0.9 % 100 mL IVPB  Status:  Discontinued        1 g 200 mL/hr over 30 Minutes Intravenous Every 24 hours 07/24/21 2130 07/27/21 0729   07/24/21 2230  azithromycin (ZITHROMAX) 500 mg in sodium chloride 0.9 % 250 mL IVPB  Status:  Discontinued        500 mg 250 mL/hr over 60 Minutes Intravenous Every 24 hours 07/24/21 2130 07/26/21 1415       Objective:  Vital Signs  Vitals:   08/02/21 2251 08/03/21 0336 08/03/21 0833 08/03/21 1006  BP: 132/84 135/86  (!) 135/99  Pulse: 100 (!) 102  94  Resp: '20 20  20  '$ Temp: 98.8 F (37.1 C) 98.7 F (37.1 C)  97.8 F (36.6 C)  TempSrc: Oral Oral  Oral  SpO2: 91% 96% 100% 98%  Weight:  68.4 kg    Height:        Intake/Output Summary (Last 24 hours) at 08/03/2021 1039 Last data filed at 08/03/2021 1008 Gross per 24 hour  Intake 360 ml  Output 450 ml  Net -90 ml    Filed Weights   07/29/21 0500 08/02/21 0555 08/03/21  0336  Weight: 73 kg 70.6 kg 68.4 kg    General appearance: Awake alert.  In no distress Resp: Clear to auscultation bilaterally.  Normal effort Cardio: S1-S2 is normal regular.  No S3-S4.  No rubs murmurs or bruit GI: Abdomen is soft.  Nontender nondistended.  Bowel sounds are present normal.  No masses organomegaly Extremities: No edema.  Full range of motion of lower extremities. Neurologic: Alert and oriented x3.  No focal neurological deficits.     Lab Results:  Data Reviewed: I have personally reviewed following labs and reports of the imaging studies  CBC: Recent Labs  Lab 07/28/21 0417 07/29/21 0249 07/30/21 0148 07/31/21 0207 08/01/21 0039  WBC 13.5* 12.2* 14.4* 12.5* 9.4  NEUTROABS 10.9*  --  12.0* 9.4* 6.6  HGB 12.5 12.4 12.9 13.0 12.3  HCT 41.3 37.8 39.8 40.8 38.2  MCV 112.2* 109.9* 108.2* 107.9* 107.6*  PLT 511* 442* 477* 501* 482*     Basic Metabolic Panel: Recent Labs  Lab 07/28/21 0417 07/29/21 0249 07/29/21 1857 07/30/21 0148 07/31/21 0207 08/01/21 0039 08/02/21 0050  NA 139   < >  140 140 142 139 140  K 3.8   < > 3.5 3.1* 3.2* 3.4* 3.5  CL 101   < > 99 101 105 103 100  CO2 31   < > '27 26 28 27 30  '$ GLUCOSE 127*   < > 82 77 77 78 104*  BUN 7*   < > 10 7* 5* <5* <5*  CREATININE 0.49   < > 0.52 0.50 0.50 0.58 0.41*  CALCIUM 8.4*   < > 8.3* 8.1* 8.1* 7.9* 8.0*  MG 1.8  --  2.0 1.8 1.9 1.7  --    < > = values in this interval not displayed.     GFR: Estimated Creatinine Clearance: 67.7 mL/min (A) (by C-G formula based on SCr of 0.41 mg/dL (L)).  Liver Function Tests: Recent Labs  Lab 07/28/21 0417 07/29/21 0249 07/30/21 0148 07/31/21 0207 08/01/21 0039  AST '22 18 19 21 21  '$ ALT '12 14 13 13 12  '$ ALKPHOS 92 73 80 78 64  BILITOT 0.6 0.5 1.2 0.7 0.8  PROT 4.6* 4.3* 4.4* 4.6* 4.2*  ALBUMIN 2.4* 2.2* 2.2* 2.3* 2.1*      CBG: Recent Labs  Lab 07/28/21 2300 07/29/21 0302 07/29/21 0743 08/01/21 0835 08/03/21 0632  GLUCAP 77 86 88 81  124*      Recent Results (from the past 240 hour(s))  MRSA Next Gen by PCR, Nasal     Status: None   Collection Time: 07/28/21  2:25 PM   Specimen: Nasal Mucosa; Nasal Swab  Result Value Ref Range Status   MRSA by PCR Next Gen NOT DETECTED NOT DETECTED Final    Comment: (NOTE) The GeneXpert MRSA Assay (FDA approved for NASAL specimens only), is one component of a comprehensive MRSA colonization surveillance program. It is not intended to diagnose MRSA infection nor to guide or monitor treatment for MRSA infections. Test performance is not FDA approved in patients less than 51 years old. Performed at [facility], [place] [address] [place], [place] [address]       Radiology Studies: No results found.     LOS: 10 days   Anaise Sterbenz [employer] on [email or web address]  08/03/2021, 10:39 AM

## 2021-08-03 NOTE — Evaluation (Signed)
Occupational Therapy Evaluation Patient Details Name: Kathryn Underwood MRN: [id number] DOB: 12/09/1960 Today's Date: 08/03/2021   History of Present Illness 61 y/o female presented to ED on 07/24/21 for constipation/abdomen pain/black diarrhea. 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 Underwent ETOH withdrawal while admitted. S/p peri-pancreatic fluid drainage on 7/6. PMH: alcohol dependence, COPD, hx of herpes   Clinical Impression   Pt admitted with above. She demonstrates the below listed deficits and will benefit from continued OT to maximize safety and independence with BADLs.  Pt presents to OT with generalized weakness, decreased activity tolerance, impaired balance.  She currently requires min A, overall, for ADLs and functional mobility.  HR to 117 with activity.  Recommend HHOT at discharge.         Recommendations for follow up therapy are one component of a multi-disciplinary discharge planning process, led by the attending physician.  Recommendations may be updated based on patient status, additional functional criteria and insurance authorization.   Follow Up Recommendations  Home health OT    Assistance Recommended at Discharge Frequent or constant Supervision/Assistance  Patient can return home with the following A little help with walking and/or transfers;A little help with bathing/dressing/bathroom;Assistance with cooking/housework;Direct supervision/assist for medications management;Direct supervision/assist for financial management;Help with stairs or ramp for entrance;Assist for transportation    Functional Status Assessment  Patient has had a recent decline in their functional status and demonstrates the ability to make significant improvements in function in a reasonable and predictable amount of time.  Equipment Recommendations  None recommended by OT    Recommendations for Other Services       Precautions / Restrictions  Precautions Precautions: Fall Precaution Comments: watch HR Restrictions Weight Bearing Restrictions: No      Mobility Bed Mobility Overal bed mobility: Modified Independent                  Transfers                          Balance Overall balance assessment: Needs assistance Sitting-balance support: Feet supported Sitting balance-Leahy Scale: Good     Standing balance support: No upper extremity supported, During functional activity Standing balance-Leahy Scale: Fair                             ADL either performed or assessed with clinical judgement   ADL Overall ADL's : Needs assistance/impaired Eating/Feeding: Modified independent   Grooming: Wash/dry hands;Wash/dry face;Oral care;Brushing hair;Minimal assistance;Standing   Upper Body Bathing: Minimal assistance;Sitting   Lower Body Bathing: Minimal assistance;Sit to/from stand   Upper Body Dressing : Minimal assistance;Sitting   Lower Body Dressing: Minimal assistance;Sit to/from stand   Toilet Transfer: Minimal assistance;Ambulation;Comfort height toilet;Grab bars   Toileting- Clothing Manipulation and Hygiene: Minimal assistance;Sit to/from stand       Functional mobility during ADLs: Minimal assistance       Vision Patient Visual Report: No change from baseline       Perception     Praxis      Pertinent Vitals/Pain Pain Assessment Pain Assessment: Faces Faces Pain Scale: Hurts little more Pain Location: abdomen Pain Descriptors / Indicators: Sore, Cramping Pain Intervention(s): Monitored during session, Repositioned     Hand Dominance     Extremity/Trunk Assessment Upper Extremity Assessment Upper Extremity Assessment: Generalized weakness   Lower Extremity Assessment Lower Extremity Assessment: Defer to PT evaluation   Cervical /  Trunk Assessment Cervical / Trunk Assessment: Other exceptions (distended abdomen)   Communication  Communication Communication: No difficulties   Cognition Arousal/Alertness: Awake/alert Behavior During Therapy: WFL for tasks assessed/performed Overall Cognitive Status: Impaired/Different from baseline Area of Impairment: Attention, Problem solving                   Current Attention Level: Selective         Problem Solving: Slow processing       General Comments  HR to 117 with activity    Exercises     Shoulder Instructions      Home Living Family/patient expects to be discharged to:: Private residence Living Arrangements: Spouse/significant other Available Help at Discharge: Family;Available PRN/intermittently Type of Home: House Home Access: Stairs to enter [employer] of Steps: 2 Entrance Stairs-Rails: None Home Layout: One level     Bathroom Shower/Tub: Occupational psychologist: Standard     Home Equipment: Shower seat - built in   Additional Comments: Lives with Kathryn Underwood' who works during the day.  Pt reports that daughter will assist "some" during the day      Prior Functioning/Environment Prior Level of Function : Independent/Modified Independent;Driving                        OT Problem List: Decreased activity tolerance;Impaired balance (sitting and/or standing);Decreased safety awareness;Cardiopulmonary status limiting activity;Pain      OT Treatment/Interventions: Self-care/ADL training;Therapeutic exercise;Energy conservation;DME and/or AE instruction;Therapeutic activities;Patient/family education;Balance training    OT Goals(Current goals can be found in the care plan section) Acute Rehab OT Goals Patient Stated Goal: to have less pain and to feel better OT Goal Formulation: With patient Time For Goal Achievement: 08/17/21 Potential to Achieve Goals: Good ADL Goals Pt Will Perform Grooming: with supervision;standing Pt Will Perform Upper Body Bathing: with set-up;sitting Pt Will Perform Lower Body  Bathing: with supervision;sit to/from stand;with adaptive equipment Pt Will Perform Upper Body Dressing: with set-up;sitting Pt Will Perform Lower Body Dressing: with adaptive equipment;with supervision;sit to/from stand Pt Will Transfer to Toilet: with supervision;ambulating;regular height toilet;grab bars Pt Will Perform Toileting - Clothing Manipulation and hygiene: with supervision;sit to/from stand Pt Will Perform Tub/Shower Transfer: Shower transfer;with min guard assist;shower seat;grab bars  OT Frequency: Min 2X/week    Co-evaluation              AM-PAC OT "6 Clicks" Daily Activity     Outcome Measure Help from another person eating meals?: None Help from another person taking care of personal grooming?: A Little Help from another person toileting, which includes using toliet, bedpan, or urinal?: A Little Help from another person bathing (including washing, rinsing, drying)?: A Little Help from another person to put on and taking off regular upper body clothing?: A Little Help from another person to put on and taking off regular lower body clothing?: A Little 6 Click Score: 19   End of Session Nurse Communication: Mobility status  Activity Tolerance: Patient limited by fatigue;Patient limited by pain Patient left: in bed;with call bell/phone within reach  OT Visit Diagnosis: Unsteadiness on feet (R26.81);Pain Pain - part of body:  (abdomen)                Time: 9528-4132 OT Time Calculation (min): 20 min Charges:  OT General Charges $OT Visit: 1 Visit OT Evaluation $OT Eval Moderate Complexity: 1 Mod OT Treatments $Therapeutic Activity: 8-22 mins  Nilsa Nutting., OTR/L Acute Rehabilitation Services Pager [phone]  Office [place] 08/03/2021, 1:24 PM

## 2021-08-03 NOTE — Progress Notes (Signed)
Notified by CCMD pt having occasional runs of ventricular bigeminy. MD notified.  Raelyn Number, RN

## 2021-08-03 NOTE — Plan of Care (Signed)
°  Problem: Education: °Goal: Knowledge of General Education information will improve °Description: Including pain rating scale, medication(s)/side effects and non-pharmacologic comfort measures °Outcome: Progressing °  °Problem: Health Behavior/Discharge Planning: °Goal: Ability to manage health-related needs will improve °Outcome: Progressing °  °Problem: Clinical Measurements: °Goal: Ability to maintain clinical measurements within normal limits will improve °Outcome: Progressing °  °Problem: Clinical Measurements: °Goal: Respiratory complications will improve °Outcome: Progressing °  °Problem: Activity: °Goal: Risk for activity intolerance will decrease °Outcome: Progressing °  °

## 2021-08-04 LAB — GLUCOSE, CAPILLARY: Glucose-Capillary: 91 mg/dL (ref 70–99)

## 2021-08-04 LAB — CYTOLOGY - NON PAP

## 2021-08-04 LAB — BASIC METABOLIC PANEL
Anion gap: 13 (ref 5–15)
BUN: <5 mg/dL — ABNORMAL LOW (ref 8–23)
CO2: 29 mmol/L (ref 22–32)
Calcium: 8.8 mg/dL — ABNORMAL LOW (ref 8.9–10.3)
Chloride: 95 mmol/L — ABNORMAL LOW (ref 98–111)
Creatinine, Ser: 0.53 mg/dL (ref 0.44–1.00)
GFR, Estimated: >60 mL/min (ref >60)
Glucose, Bld: 104 mg/dL — ABNORMAL HIGH (ref 70–99)
Potassium: 3.7 mmol/L (ref 3.5–5.1)
Sodium: 137 mmol/L (ref 135–145)

## 2021-08-04 LAB — MAGNESIUM: Magnesium: 1.7 mg/dL (ref 1.7–2.4)

## 2021-08-04 MED ORDER — ALPRAZOLAM 0.25 MG PO TABS
0.2500 mg | ORAL_TABLET | Freq: Two times a day (BID) | ORAL | Status: DC | PRN
Start: 2021-08-04 — End: 2021-08-06
  Administered 2021-08-04: 0.25 mg via ORAL
  Filled 2021-08-04: qty 1

## 2021-08-04 MED ORDER — DILTIAZEM HCL 60 MG PO TABS
30.0000 mg | ORAL_TABLET | Freq: Three times a day (TID) | ORAL | Status: DC
  Administered 2021-08-04 – 2021-08-06 (×5): 30 mg via ORAL
  Filled 2021-08-04 (×5): qty 1

## 2021-08-04 MED ORDER — METOPROLOL TARTRATE 5 MG/5ML IV SOLN
2.5000 mg | Freq: Once | INTRAVENOUS | Status: AC
  Administered 2021-08-04: 2.5 mg via INTRAVENOUS
  Filled 2021-08-04: qty 5

## 2021-08-04 MED ORDER — POTASSIUM CHLORIDE CRYS ER 20 MEQ PO TBCR
40.0000 meq | EXTENDED_RELEASE_TABLET | Freq: Once | ORAL | Status: AC
  Administered 2021-08-04: 40 meq via ORAL
  Filled 2021-08-04: qty 2

## 2021-08-04 MED ORDER — METOPROLOL TARTRATE 25 MG PO TABS
25.0000 mg | ORAL_TABLET | Freq: Two times a day (BID) | ORAL | Status: DC
  Administered 2021-08-04: 25 mg via ORAL
  Filled 2021-08-04: qty 1

## 2021-08-04 MED ORDER — MOMETASONE FURO-FORMOTEROL FUM 200-5 MCG/ACT IN AERO
2.0000 | INHALATION_SPRAY | Freq: Two times a day (BID) | RESPIRATORY_TRACT | Status: DC
  Administered 2021-08-04 – 2021-08-06 (×5): 2 via RESPIRATORY_TRACT
  Filled 2021-08-04: qty 8.8

## 2021-08-04 NOTE — Progress Notes (Signed)
Physical Therapy Treatment Patient Details Name: Kathryn Underwood MRN: [id number] DOB: 09/19/1960 Today's Date: 08/04/2021   History of Present Illness 61 y/o female presented to ED on 07/24/21 for constipation/abdomen pain/black diarrhea. 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 Underwent ETOH withdrawal while admitted. S/p peri-pancreatic fluid drainage on 7/6. PMH: alcohol dependence, COPD, hx of herpes    PT Comments    Pt making steady progress with mobility and balance. Expect she will continue to progress back to her baseline especially as she is up mobilizing more.    Recommendations for follow up therapy are one component of a multi-disciplinary discharge planning process, led by the attending physician.  Recommendations may be updated based on patient status, additional functional criteria and insurance authorization.  Follow Up Recommendations  Home health PT     Assistance Recommended at Discharge Intermittent Supervision/Assistance  Patient can return home with the following A little help with bathing/dressing/bathroom;Help with stairs or ramp for entrance;Assistance with cooking/housework   Equipment Recommendations  None recommended by PT    Recommendations for Other Services       Precautions / Restrictions Precautions Precautions: Fall Precaution Comments: watch HR Restrictions Weight Bearing Restrictions: No     Mobility  Bed Mobility Overal bed mobility: Modified Independent                  Transfers Overall transfer level: Needs assistance Equipment used: None Transfers: Sit to/from Stand Sit to Stand: Supervision           General transfer comment: supervision for safety    Ambulation/Gait Ambulation/Gait assistance: Min guard Gait Distance (Feet): 200 Feet Assistive device: None, 1 person hand held assist Gait Pattern/deviations: Step-through pattern, Decreased stride length Gait velocity: decreased Gait  velocity interpretation: 1.31 - 2.62 ft/sec, indicative of limited community ambulator   General Gait Details: Assist for safety. Pt with hesitant guarded gait without hand held   Stairs             Wheelchair Mobility    Modified Rankin (Stroke Patients Only)       Balance Overall balance assessment: Needs assistance Sitting-balance support: Feet supported Sitting balance-Leahy Scale: Good     Standing balance support: No upper extremity supported, During functional activity Standing balance-Leahy Scale: Fair                              Cognition Arousal/Alertness: Awake/alert Behavior During Therapy: WFL for tasks assessed/performed Overall Cognitive Status: Impaired/Different from baseline Area of Impairment: Problem solving                             Problem Solving: Slow processing          Exercises      General Comments General comments (skin integrity, edema, etc.): HR variable - 97 - 150      Pertinent Vitals/Pain Pain Assessment Pain Assessment: No/denies pain    Home Living                          Prior Function            PT Goals (current goals can now be found in the care plan section) Progress towards PT goals: Progressing toward goals    Frequency    Min 3X/week      PT Plan Current plan remains appropriate  Co-evaluation              AM-PAC PT "6 Clicks" Mobility   Outcome Measure  Help needed turning from your back to your side while in a flat bed without using bedrails?: None Help needed moving from lying on your back to sitting on the side of a flat bed without using bedrails?: None Help needed moving to and from a bed to a chair (including a wheelchair)?: A Little Help needed standing up from a chair using your arms (e.g., wheelchair or bedside chair)?: A Little Help needed to walk in hospital room?: A Little Help needed climbing 3-5 steps with a railing? : A Little 6  Click Score: 20    End of Session   Activity Tolerance: Patient tolerated treatment well Patient left: in chair;with call bell/phone within reach   PT Visit Diagnosis: Unsteadiness on feet (R26.81);Muscle weakness (generalized) (M62.81)     Time: 6333-5456 PT Time Calculation (min) (ACUTE ONLY): 22 min  Charges:  $Gait Training: 8-22 mins                     [place] Office [place] 08/04/2021, 2:48 PM

## 2021-08-04 NOTE — Progress Notes (Addendum)
[facility] PROGRESS NOTE   Kathryn Underwood WUJ:[id number] DOB: 16-Jan-1961 DOA: 07/24/2021  PCP: Fredirick Lathe, PA-C  Brief History/Interval Summary: 61 year old female with past medical history of anxiety, depression, GERD, alcohol use drinks 1 wine bottle per day presented with abdominal pain ongoing for 2 weeks. Found to have acute pancreatitis as well as pancreatic cyst.  To be transferred to the ICU due to oversedation.  Subsequently transferred back out.   Consultants: Gastroenterology.  Interventional radiology  Procedures: Aspiration of the peripancreatic fluid collection 7/6   Subjective/Interval History: Patient mentions that she is feeling well.  Denies any nausea vomiting.  Abdominal pain has improved.  No chest pain or shortness of breath.     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  Patient seems to be stable for the most part.  Abdominal CT scan was repeated on 7/2 which showed evidence of pancreatic hemorrhage and lesion along with worsening cirrhosis. Interventional radiology was consulted.  Patient underwent aspiration of the peripancreatic fluid collection on 7/6.  1300 mL of fluid was removed. Diarrhea likely due to pancreatitis and reinitiation of diet.  Seems to be improving.  Continue Imodium as needed.  Continue probiotics.  Abdomen is benign.    Questionable pancreatic lesion Abnormal appearing CT was noted as discussed above and for which MRI was recommended.  Due to her tenuous respiratory status and altered mental status that has not been done yet.  Since both of these parameters have improved we will go ahead and order MRI.  MRI shows reduction in the volume of the cystic collection after aspiration.  Small fluid collection is also noted.  This is adjacent to the body of the pancreas.  Ductal dilatation in the distal body and tail of the pancreas with potential lesion at  the mid body of the pancreas appreciated.  Unclear why no contrast was given since MRI was ordered with contrast. GI to weigh in on these findings.  We will order CA 19-9.  Acute metabolic encephalopathy Likely due to delirium from alcohol withdrawal.  Patient has been on CIWA protocol.  Became quite encephalopathic requiring restraints.  Was given Haldol.   Patient has improved and seems to be back to baseline now.    Sinus tachycardia Noted to have sinus tachycardia.  Heart rate sometimes tend to go into the 130s.  Stop the Xopenex and nebulizer treatment since her respiratory status has improved.  TSH noted to be 6.3 on June 29. Sinus tachycardia is multifactorial including acute illness, nebulizer treatment.  We will give her low-dose metoprolol.  Alcohol dependence Has been counseled.  See above.  On CIWA protocol.  Continue thiamine.  No evidence of withdrawal currently.  Acute hypoxic and hypercapnic respiratory failure Likely due to combination of hypoventilation from abdominal pain along with delirium from alcohol withdrawal and intravenous agents including benzodiazepines and pain medications. Patient was transferred to the ICU.  Monitored closely.  Did not require intubation.  Received IV Narcan.  Transferred back up to the floor. Respiratory status has improved.  Now saturating normal on room air.  Continue incentive spirometer.  History of COPD Had mild acute exacerbation few days ago.  She was tachypneic and tachycardic.  Chest x-ray showed atelectasis and small right pleural effusion.  IV fluids were discontinued.  She was given nebulizer treatment with improvement in symptoms.  Did not require steroids.  Stop Xopenex scheduled nebulizer treatments due to tachycardia.  Initiate Dulera.  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  No anticoagulation due to concern for pancreatic hemorrhage.  Hypokalemia Potassium level has improved.    Left pulmonary  nodule Will require outpatient surveillance.  Uterine fibroid Incidentally noted.  Outpatient follow-up.  History of depression and anxiety Home medications including Seroquel Remeron gabapentin and eszopiclone on hold.  Herpes zoster Takes Valtrex as needed.  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  She has a history of same.  Continue with Valtrex at this time.  DVT Prophylaxis: SCDs.  No anticoagulation due to pancreatic hemorrhage Code Status: Full code Family Communication: Discussed with patient.  No family at bedside Disposition Plan: Hopefully return home when improved.  Seen by physical therapy.  Home health is recommended at discharge.  Status is: Inpatient Remains inpatient appropriate because: Delirium, pancreatitis    Medications: Scheduled:  Chlorhexidine Gluconate Cloth  6 each Topical Daily   folic acid  1 mg Oral Daily   Gerhardt's butt cream   Topical TID   metoprolol tartrate  2.5 mg Intravenous Once   metoprolol tartrate  25 mg Oral BID   mometasone-formoterol  2 puff Inhalation BID   mouth rinse  15 mL Mouth Rinse 4 times per day   pantoprazole  40 mg Oral Q0600   saccharomyces boulardii  250 mg Oral BID   thiamine  100 mg Oral Daily   valACYclovir  1,000 mg Oral Daily   Continuous:   FFM:[id number] lactate, levalbuterol, loperamide, morphine injection, naloxone, ondansetron **OR** ondansetron (ZOFRAN) IV, mouth rinse, oxyCODONE, traZODone  Antibiotics: Anti-infectives (From admission, onward)    Start     Dose/Rate Route Frequency Ordered Stop   08/02/21 1130  valACYclovir (VALTREX) tablet 1,000 mg        1,000 mg Oral Daily 08/02/21 1035     07/24/21 2230  cefTRIAXone (ROCEPHIN) 1 g in sodium chloride 0.9 % 100 mL IVPB  Status:  Discontinued        1 g 200 mL/hr over 30 Minutes Intravenous Every 24 hours 07/24/21 2130 07/27/21 0729   07/24/21 2230  azithromycin (ZITHROMAX) 500 mg in sodium chloride 0.9 % 250 mL IVPB  Status:   Discontinued        500 mg 250 mL/hr over 60 Minutes Intravenous Every 24 hours 07/24/21 2130 07/26/21 1415       Objective:  Vital Signs  Vitals:   08/04/21 0003 08/04/21 0343 08/04/21 0759 08/04/21 0855  BP: 129/83 111/81  111/68  Pulse: 83 (!) 108  (!) 113  Resp: '18 20  20  '$ Temp: 97.9 F (36.6 C) 98 F (36.7 C)  98.3 F (36.8 C)  TempSrc: Oral Oral  Oral  SpO2: 92% 93% 99% 93%  Weight:      Height:        Intake/Output Summary (Last 24 hours) at 08/04/2021 0926 Last data filed at 08/03/2021 1700 Gross per 24 hour  Intake 480 ml  Output --  Net 480 ml    Filed Weights   07/29/21 0500 08/02/21 0555 08/03/21 0336  Weight: 73 kg 70.6 kg 68.4 kg    General appearance: Awake alert.  In no distress Resp: Clear to auscultation bilaterally.  Normal effort Cardio: S1-S2 is tachycardic regular.  No S3-S4. GI: Abdomen is soft.  Nontender nondistended.  Bowel sounds are present normal.  No masses organomegaly Extremities: No edema.  Full range of motion of lower extremities. Neurologic: Alert and oriented x3.  No focal neurological deficits.      Lab Results:  Data Reviewed: I have personally reviewed following labs and reports of the imaging studies  CBC: Recent Labs  Lab 07/29/21 0249 07/30/21 0148 07/31/21 0207 08/01/21 0039  WBC 12.2* 14.4* 12.5* 9.4  NEUTROABS  --  12.0* 9.4* 6.6  HGB 12.4 12.9 13.0 12.3  HCT 37.8 39.8 40.8 38.2  MCV 109.9* 108.2* 107.9* 107.6*  PLT 442* 477* 501* 482*     Basic Metabolic Panel: Recent Labs  Lab 07/29/21 1857 07/30/21 0148 07/31/21 0207 08/01/21 0039 08/02/21 0050 08/04/21 0009  NA 140 140 142 139 140 137  K 3.5 3.1* 3.2* 3.4* 3.5 3.7  CL 99 101 105 103 100 95*  CO2 '27 26 28 27 30 29  '$ GLUCOSE 82 77 77 78 104* 104*  BUN 10 7* 5* <5* <5* <5*  CREATININE 0.52 0.50 0.50 0.58 0.41* 0.53  CALCIUM 8.3* 8.1* 8.1* 7.9* 8.0* 8.8*  MG 2.0 1.8 1.9 1.7  --  1.7     GFR: Estimated Creatinine Clearance: 67.7 mL/min  (by C-G formula based on SCr of 0.53 mg/dL).  Liver Function Tests: Recent Labs  Lab 07/29/21 0249 07/30/21 0148 07/31/21 0207 08/01/21 0039  AST '18 19 21 21  '$ ALT '14 13 13 12  '$ ALKPHOS 73 80 78 64  BILITOT 0.5 1.2 0.7 0.8  PROT 4.3* 4.4* 4.6* 4.2*  ALBUMIN 2.2* 2.2* 2.3* 2.1*      CBG: Recent Labs  Lab 07/29/21 0302 07/29/21 0743 08/01/21 0835 08/03/21 0632 08/04/21 0613  GLUCAP 86 88 81 124* 91      Recent Results (from the past 240 hour(s))  MRSA Next Gen by PCR, Nasal     Status: None   Collection Time: 07/28/21  2:25 PM   Specimen: Nasal Mucosa; Nasal Swab  Result Value Ref Range Status   MRSA by PCR Next Gen NOT DETECTED NOT DETECTED Final    Comment: (NOTE) The GeneXpert MRSA Assay (FDA approved for NASAL specimens only), is one component of a comprehensive MRSA colonization surveillance program. It is not intended to diagnose MRSA infection nor to guide or monitor treatment for MRSA infections. Test performance is not FDA approved in patients less than 35 years old. Performed at [facility], [place] [address], [place], [place] [address]       Radiology Studies: MR ABDOMEN MRCP WO CONTRAST  Result Date: 08/04/2021 CLINICAL DATA:  Pancreatitis EXAM: MRI ABDOMEN WITHOUT CONTRAST  (INCLUDING MRCP) TECHNIQUE: Multiplanar multisequence MR imaging of the abdomen was performed. Heavily T2-weighted images of the biliary and pancreatic ducts were obtained, and three-dimensional MRCP images were rendered by post processing. COMPARISON:  CT 07/27/2021 FINDINGS: Lower chest: Bilateral pleural effusions and basilar atelectasis unchanged from comparison exam. Hepatobiliary: Several small lesions which are hyperintense on T2 weighted imaging most consistent small benign cysts (no IV contrast). Normal gallbladder. No biliary duct dilatation. Common bile duct is normal caliber. No evidence of hepatic steatosis on opposed phase imaging. Pancreas: There is ductal dilatation  in the mid body and tail of the pancreas (image 22/4). There is a hypointense region on noncontrast T1 weighted imaging a just downstream from the duct dilatation measuring 1.7 x 1.5 cm (image 52/series 22). No IV contrast administered. More distally in the distal body and head of the pancreas relatively normal parenchyma without duct dilatation. No variant ductal anatomy identified. ventral to the pancreatic duct dilatation is an oblong fluid collection measuring 4.7 x 2.1 cm which is similar to 5.2 s 3.1 cm on comparison CT. No  evidence of hemorrhage within this collection. Larger cystic collection between pancreas and greater curvature of the stomach is decreased in volume measuring 8.2 x 4.7 cm (image 18/series 4) decreased from 11.7 by 8.6 cm. Spleen: The spleen is normal volume. Adrenals/urinary tract: Adrenal glands and kidneys are normal. Stomach/Bowel: The stomach and limited view of the bowel is unremarkable. Vascular/Lymphatic: Abdominal aorta is normal caliber. No periportal or retroperitoneal adenopathy. No pelvic adenopathy. Other: No free fluid. Musculoskeletal: No aggressive osseous lesion. IMPRESSION: 1. Reduction in volume of cystic collection along the greater curvature the stomach following cyst aspiration. 2. Smaller fluid collection adjacent to the body the pancreas is slightly decreased in size. Findings suggest pancreatic pseudocysts. 3. Duct dilatation in the distal body and tail the pancreas with potential lesion at the mid body of the pancreas. Differential includes focal pancreatitis versus PANCREATIC NEOPLASM. No IV contrast administered. Recommend correlation with tumor markers and follow-up MRI imaging with contrast when appropriate versus investigation with endoscopic ultrasound. 4. No gallstones, choledocholithiasis or duct dilatation. 5. Benign hepatic cysts.  NO EVIDENCE OF HEPATIC STEATOSIS. 6. Stable bilateral pleural effusions and basilar atelectasis. Electronically Signed   By:  Suzy Bouchard M.D.   On: 08/04/2021 08:47       LOS: 11 days   [facility] Pager on [email or web address]  08/04/2021, 9:26 AM

## 2021-08-04 NOTE — TOC Progression Note (Signed)
Transition of Care [facility]) - Progression Note    Patient Details  Name: Kathryn Underwood MRN: [id number] Date of Birth: 18-Oct-1960  [facility]) CM/SW Contact  Bartholomew Crews, RN Phone Number: [phone] 08/04/2021, 3:09 PM  Clinical Narrative:     TOC continuing to follow for transition needs. Mentation has returned to baseline. No longer requiring restraints. GI weighing in. PT/OT recommending HH, will refer closer to discharge. TOC to fill discharge medications, MATCH will need to be reinstated. Financial counselor referral.   Expected Discharge Plan: Home/Self Care Barriers to Discharge: Continued Medical Work up  Expected Discharge Plan and Services Expected Discharge Plan: Home/Self Care In-house Referral: Financial Counselor Discharge Planning Services: Medication Assistance   Living arrangements for the past 2 months: Single Family Home                 DME Arranged: N/A         HH Arranged: NA           Social Determinants of Health (SDOH) Interventions    Readmission Risk Interventions     No data to display

## 2021-08-04 NOTE — Progress Notes (Addendum)
Attending physician's note   I have taken a history, reviewed the chart, and examined the patient. I performed a substantive portion of this encounter, including complete performance of at least one of the key components, in conjunction with the APP. I agree with the APP's note, impression, and recommendations with my edits.   I discussed the MRI findings with the patient and spouse at bedside today.  Briefly discussed the possibility and role/utility of endoscopic cyst gastrostomy for treatment of pseudocyst if required down the line.  Additionally, discussed DDx for MRI findings in the body/neck region of the pancreas, to include possibility of pancreatic cancer.  I think this should be further evaluated with EUS as an outpatient.  Will start coordinating outpatient EUS with the advanced pancreaticobiliary service.  CA 19-9 pending.  Has follow-up already scheduled in GI clinic on 09/12/2021 at 9:30 AM with Tye Savoy, NP.  Inpatient GI service will otherwise sign off at this time.  If able to secure date/time for outpatient EUS prior to patient discharge, will communicate that plan to the patient and primary team.  Gerrit Heck, DO, FACG [phone] office         [facility] Progress Note  CC:  Alcoholic pancreatitis and large pseudocyst  Subjective: She is tolerating a soft diet. No N/V. She has less upper abdominal pain. She is tolerating a soft diet. She passed a soft dark brown stool today. No alcohol since 7/4.    Objective:  Vital signs in last 24 hours: Temp:  [97.9 F (36.6 C)-98.3 F (36.8 C)] 98.3 F (36.8 C) (07/10 0855) Pulse Rate:  [83-113] 113 (07/10 0855) Resp:  [18-20] 20 (07/10 0855) BP: (111-140)/(68-89) 111/68 (07/10 0855) SpO2:  [92 %-100 %] 93 % (07/10 0855) FiO2 (%):  [97 %] 97 % (07/09 2018) Last BM Date : 08/02/21 General: Alert 61 year old female in NAD. Heart: RRR, no murmur.  Pulm: Breath sounds clear throughout.  Abdomen:  Soft, mild tenderness to the epigastric area without rebound or guarding. Positive bowel sounds x 4 quads. LUQ bandage site intact.  Extremities:  Without edema. Neurologic:  Alert and  oriented x 4. Grossly normal neurologically. Psych:  Alert and cooperative. Normal mood and affect.  Intake/Output from previous day: 07/09 0701 - 07/10 0700 In: 480 [P.O.:480] Out: -  Intake/Output this shift: No intake/output data recorded.  Lab Results: No results for input(s): "WBC", "HGB", "HCT", "PLT" in the last 72 hours. BMET Recent Labs    08/02/21 0050 08/04/21 0009  NA 140 137  K 3.5 3.7  CL 100 95*  CO2 30 29  GLUCOSE 104* 104*  BUN <5* <5*  CREATININE 0.41* 0.53  CALCIUM 8.0* 8.8*   LFT No results for input(s): "PROT", "ALBUMIN", "AST", "ALT", "ALKPHOS", "BILITOT", "BILIDIR", "IBILI" in the last 72 hours. PT/INR No results for input(s): "LABPROT", "INR" in the last 72 hours. Hepatitis Panel No results for input(s): "HEPBSAG", "HCVAB", "HEPAIGM", "HEPBIGM" in the last 72 hours.  MR ABDOMEN MRCP WO CONTRAST  Result Date: 08/04/2021 CLINICAL DATA:  Pancreatitis EXAM: MRI ABDOMEN WITHOUT CONTRAST  (INCLUDING MRCP) TECHNIQUE: Multiplanar multisequence MR imaging of the abdomen was performed. Heavily T2-weighted images of the biliary and pancreatic ducts were obtained, and three-dimensional MRCP images were rendered by post processing. COMPARISON:  CT 07/27/2021 FINDINGS: Lower chest: Bilateral pleural effusions and basilar atelectasis unchanged from comparison exam. Hepatobiliary: Several small lesions which are hyperintense on T2 weighted imaging most consistent small benign cysts (no  IV contrast). Normal gallbladder. No biliary duct dilatation. Common bile duct is normal caliber. No evidence of hepatic steatosis on opposed phase imaging. Pancreas: There is ductal dilatation in the mid body and tail of the pancreas (image 22/4). There is a hypointense region on noncontrast T1 weighted  imaging a just downstream from the duct dilatation measuring 1.7 x 1.5 cm (image 52/series 22). No IV contrast administered. More distally in the distal body and head of the pancreas relatively normal parenchyma without duct dilatation. No variant ductal anatomy identified. ventral to the pancreatic duct dilatation is an oblong fluid collection measuring 4.7 x 2.1 cm which is similar to 5.2 s 3.1 cm on comparison CT. No evidence of hemorrhage within this collection. Larger cystic collection between pancreas and greater curvature of the stomach is decreased in volume measuring 8.2 x 4.7 cm (image 18/series 4) decreased from 11.7 by 8.6 cm. Spleen: The spleen is normal volume. Adrenals/urinary tract: Adrenal glands and kidneys are normal. Stomach/Bowel: The stomach and limited view of the bowel is unremarkable. Vascular/Lymphatic: Abdominal aorta is normal caliber. No periportal or retroperitoneal adenopathy. No pelvic adenopathy. Other: No free fluid. Musculoskeletal: No aggressive osseous lesion. IMPRESSION: 1. Reduction in volume of cystic collection along the greater curvature the stomach following cyst aspiration. 2. Smaller fluid collection adjacent to the body the pancreas is slightly decreased in size. Findings suggest pancreatic pseudocysts. 3. Duct dilatation in the distal body and tail the pancreas with potential lesion at the mid body of the pancreas. Differential includes focal pancreatitis versus PANCREATIC NEOPLASM. No IV contrast administered. Recommend correlation with tumor markers and follow-up MRI imaging with contrast when appropriate versus investigation with endoscopic ultrasound. 4. No gallstones, choledocholithiasis or duct dilatation. 5. Benign hepatic cysts.  NO EVIDENCE OF HEPATIC STEATOSIS. 6. Stable bilateral pleural effusions and basilar atelectasis. Electronically Signed   By: Suzy Bouchard M.D.   On: 08/04/2021 08:47    Assessment / Plan:  59) 61 year old female admitted to the  hospital 07/24/2021 with N/V and abdominal pain, diagnosed with acute alcoholic pancreatitis with a large pancreatic fluid collection/pseudocyst with underlying hemorrhage resulting in gastric compression s/p aspiration/drainage with the removal of 1.3L dark fluid per IR on 7/6.  Pancreatic fluid cultures pending. Abdominal MRI 08/03/2021 showed reduction in the volume of the cystic collection along the greater curvature of the stomach and a smaller fluid collection adjacent to the body of the pancreas was decreased.  Findings consistent with pancreatic pseudocyst. -Continue Ondansetron 4 mg p.o. or IV every 6 hours as needed -Continue Pantoprazole 40 mg p.o. daily -Soft diet as tolerated  -Pain management per the hospitalist   2) Acute metabolic encephalopathy in the setting of alcohol withdrawal. CIWA protocol maintained. No signs of withdrawal or AMS at this time.  -Continue thiamine and folate daily -Patient counseled no alcohol ever   3) CT showed a focal abnormality of pancreatic body / neck region with dilation of PD distal to area raising concern for neoplasm. Abdominal MRI without contrast 08/03/2021 showed duct dilatation in the distal body and tail of pancreas with potential lesion at the mid body.  Pancreatic neoplasm could not be excluded.  CA 19-9 pending. -Consider follow-up MRI imaging with contrast versus EUS as an outpatient, await further recommendations per Dr. Bryan Lemma  . 4) Hepatic steatosis per prior imaging.  Abdominal MRI 08/03/2021 without evidence of hepatic steatosis. Normal LFTs.  5) GERD -See plan in #1    Principal Problem:   Alcohol induced acute  pancreatitis without necrosis or infection Active Problems:   Depression with anxiety   Alcohol dependence with unspecified alcohol-induced disorder (HCC)   Sinus tachycardia   Pulmonary nodule   Pancreatic pseudocyst   Delirium tremens (HCC)   Pancreas hemorrhage   Thrombosis of splenic artery (HCC)   Uterine  fibroid   COPD (chronic obstructive pulmonary disease) ([place])   Insomnia     LOS: 11 days   Noralyn Pick  08/04/2021, 2:54PM

## 2021-08-05 ENCOUNTER — Other Ambulatory Visit: Payer: Self-pay

## 2021-08-05 ENCOUNTER — Inpatient Hospital Stay (HOSPITAL_COMMUNITY): Payer: Self-pay

## 2021-08-05 ENCOUNTER — Telehealth: Payer: Self-pay

## 2021-08-05 DIAGNOSIS — R0609 Other forms of dyspnea: Secondary | ICD-10-CM

## 2021-08-05 DIAGNOSIS — K863 Pseudocyst of pancreas: Secondary | ICD-10-CM

## 2021-08-05 DIAGNOSIS — I959 Hypotension, unspecified: Secondary | ICD-10-CM

## 2021-08-05 DIAGNOSIS — K852 Alcohol induced acute pancreatitis without necrosis or infection: Secondary | ICD-10-CM

## 2021-08-05 LAB — CBC
HCT: 40 % (ref 36.0–46.0)
Hemoglobin: 13.1 g/dL (ref 12.0–15.0)
MCH: 34.2 pg — ABNORMAL HIGH (ref 26.0–34.0)
MCHC: 32.8 g/dL (ref 30.0–36.0)
MCV: 104.4 fL — ABNORMAL HIGH (ref 80.0–100.0)
Platelets: 508 10*3/uL — ABNORMAL HIGH (ref 150–400)
RBC: 3.83 MIL/uL — ABNORMAL LOW (ref 3.87–5.11)
RDW: 13.5 % (ref 11.5–15.5)
WBC: 10.2 10*3/uL (ref 4.0–10.5)
nRBC: 0 % (ref 0.0–0.2)

## 2021-08-05 LAB — ECHOCARDIOGRAM COMPLETE
Area-P 1/2: 3.31 cm2
Height: 63 in
S' Lateral: 3 cm
Weight: 2412.71 oz

## 2021-08-05 LAB — BASIC METABOLIC PANEL
Anion gap: 8 (ref 5–15)
BUN: 5 mg/dL — ABNORMAL LOW (ref 8–23)
CO2: 28 mmol/L (ref 22–32)
Calcium: 8.3 mg/dL — ABNORMAL LOW (ref 8.9–10.3)
Chloride: 101 mmol/L (ref 98–111)
Creatinine, Ser: 0.59 mg/dL (ref 0.44–1.00)
GFR, Estimated: >60 mL/min (ref >60)
Glucose, Bld: 100 mg/dL — ABNORMAL HIGH (ref 70–99)
Potassium: 4 mmol/L (ref 3.5–5.1)
Sodium: 137 mmol/L (ref 135–145)

## 2021-08-05 LAB — CANCER ANTIGEN 19-9: CA 19-9: 13 U/mL (ref 0–35)

## 2021-08-05 MED ORDER — MIRTAZAPINE 15 MG PO TABS
15.0000 mg | ORAL_TABLET | Freq: Every day | ORAL | Status: DC
  Administered 2021-08-05: 15 mg via ORAL
  Filled 2021-08-05: qty 1

## 2021-08-05 MED ORDER — QUETIAPINE FUMARATE 25 MG PO TABS
25.0000 mg | ORAL_TABLET | Freq: Every day | ORAL | Status: DC
  Administered 2021-08-05: 25 mg via ORAL
  Filled 2021-08-05: qty 1

## 2021-08-05 MED ORDER — METOPROLOL TARTRATE 5 MG/5ML IV SOLN
2.5000 mg | Freq: Three times a day (TID) | INTRAVENOUS | Status: DC | PRN
  Administered 2021-08-05: 2.5 mg via INTRAVENOUS
  Filled 2021-08-05: qty 5

## 2021-08-05 MED ORDER — AMIODARONE HCL 200 MG PO TABS
200.0000 mg | ORAL_TABLET | Freq: Two times a day (BID) | ORAL | Status: DC
  Administered 2021-08-05 – 2021-08-06 (×3): 200 mg via ORAL
  Filled 2021-08-05 (×3): qty 1

## 2021-08-05 MED ORDER — SODIUM CHLORIDE 0.9 % IV BOLUS
500.0000 mL | Freq: Once | INTRAVENOUS | Status: AC
Start: 2021-08-05 — End: 2021-08-05
  Administered 2021-08-05: 500 mL via INTRAVENOUS

## 2021-08-05 MED ORDER — SODIUM CHLORIDE 0.9 % IV BOLUS
500.0000 mL | Freq: Once | INTRAVENOUS | Status: AC
  Administered 2021-08-05: 500 mL via INTRAVENOUS

## 2021-08-05 MED ORDER — GABAPENTIN 300 MG PO CAPS
300.0000 mg | ORAL_CAPSULE | Freq: Two times a day (BID) | ORAL | Status: DC
  Administered 2021-08-05 – 2021-08-06 (×3): 300 mg via ORAL
  Filled 2021-08-05 (×3): qty 1

## 2021-08-05 NOTE — Social Work (Signed)
CSW acknowledges consult for SU counseling and resources. CSW spoke with pt via phone due to contact precautions. Pt notes that she has tried AA and rehab before and they were not for her. She states that she is not going to drink again and will use her health and family as motivators. Pt declines resources at this time.

## 2021-08-05 NOTE — Progress Notes (Addendum)
[facility] PROGRESS NOTE   Kathryn Underwood:[id number] DOB: 1960/02/08 DOA: 07/24/2021  PCP: Fredirick Lathe, PA-C  Brief History/Interval Summary: 61 year old female with past medical history of anxiety, depression, GERD, alcohol use drinks 1 wine bottle per day presented with abdominal pain ongoing for 2 weeks. Found to have acute pancreatitis as well as pancreatic cyst.  To be transferred to the ICU due to oversedation.  Subsequently transferred back out.   Consultants: Gastroenterology.  Interventional radiology  Procedures: Aspiration of the peripancreatic fluid collection 7/6   Subjective/Interval History: Patient mentions that she is feeling well.  Did have an episode of lightheadedness this morning when she got out of bed.  No syncopal episode.  No chest pain or shortness of breath.  Abdominal pain almost resolved.     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  Patient seems to be stable for the most part.  Abdominal CT scan was repeated on 7/2 which showed evidence of pancreatic hemorrhage and lesion along with worsening cirrhosis. Interventional radiology was consulted.  Patient underwent aspiration of the peripancreatic fluid collection on 7/6.  1300 mL of fluid was removed. Diarrhea likely due to pancreatitis and reinitiation of diet.  Diarrhea has resolved.  Imodium as needed.  Probiotics.  Abdomen remains benign.  She is tolerating a diet   Questionable pancreatic lesion Abnormal appearing CT was noted as discussed above and for which MRI was recommended.  Due to her tenuous respiratory status and altered mental status that has not been done yet.  Since both of these parameters have improved we will go ahead and order MRI.  MRI shows reduction in the volume of the cystic collection after aspiration.  Small fluid collection is also noted.  This is adjacent to the body of the pancreas.   Ductal dilatation in the distal body and tail of the pancreas with potential lesion at the mid body of the pancreas appreciated.  Unclear why no contrast was given since MRI was ordered with contrast. CA 19-9 is normal at 13.   Gastroenterology to arrange outpatient EUS.  This has been arranged for July 28 at 7:30 AM with Dr. Ardis Hughs.  MAT/Sinus tachycardia/hypotension Patient has been experiencing episodes of tachycardia.  EKG done yesterday suggested sinus tach with PACs.  Patient was initially started on metoprolol and then switched over to Cardizem since there was not much response to metoprolol.  Subsequently she has developed a drop in blood pressure though she is asymptomatic for the most part.   Nebulizer treatments were discontinued as they could have been contributing to tachycardia. TSH was noted to be 6.3 on June 29. We will give her a fluid bolus today and evaluate.  We will repeat EKG.  EKG suggests MAT. No response to BB or cardizem. BP has improved. Will consult cardiology to assist with management. Echocardiogram will be ordered.    Acute metabolic encephalopathy Likely due to delirium from alcohol withdrawal.  Patient has been on CIWA protocol.  Became quite encephalopathic requiring restraints.  Was given Haldol.   Patient has improved and seems to be back to baseline now.    Alcohol dependence Has been counseled.  See above.  On CIWA protocol.  Continue thiamine.  No evidence of withdrawal currently.  Acute hypoxic and hypercapnic respiratory failure Likely due to combination of hypoventilation from abdominal pain along with delirium from alcohol withdrawal and intravenous agents including benzodiazepines and pain medications. Patient was transferred to the ICU.  Monitored closely.  Did not require intubation.  Received IV Narcan.  Transferred back up to the floor. Respiratory status has improved.  Now saturating normal on room air.  Continue incentive spirometer.  History  of COPD Had mild acute exacerbation few days ago.  She was tachypneic and tachycardic.  Chest x-ray showed atelectasis and small right pleural effusion.  IV fluids were discontinued.  She was given nebulizer treatment with improvement in symptoms.  Did not require steroids.  Stop Xopenex scheduled nebulizer treatments due to tachycardia.  Initiate Dulera.  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  No anticoagulation due to concern for pancreatic hemorrhage.  Hypokalemia Potassium level has improved.    Left pulmonary nodule Will require outpatient surveillance.  Uterine fibroid Incidentally noted.  Outpatient follow-up.  History of depression and anxiety Home medications including Seroquel Remeron gabapentin and eszopiclone on hold.  Herpes zoster Takes Valtrex as needed.  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  She has a history of same.  Continue with Valtrex at this time.  DVT Prophylaxis: SCDs.  No anticoagulation due to pancreatic hemorrhage Code Status: Full code Family Communication: Discussed with patient.  No family at bedside Disposition Plan: Hopefully return home when improved.  Seen by physical therapy.  Home health is recommended at discharge.  Based on heart rate improved.  Echocardiogram to be ordered today.  Status is: Inpatient Remains inpatient appropriate because: Delirium, pancreatitis    Medications: Scheduled:  diltiazem  30 mg Oral W2B   folic acid  1 mg Oral Daily   Gerhardt's butt cream   Topical TID   mometasone-formoterol  2 puff Inhalation BID   mouth rinse  15 mL Mouth Rinse 4 times per day   pantoprazole  40 mg Oral Q0600   saccharomyces boulardii  250 mg Oral BID   thiamine  100 mg Oral Daily   valACYclovir  1,000 mg Oral Daily   Continuous:  sodium chloride      JSE:[id number], haloperidol lactate, levalbuterol, loperamide, morphine injection, naloxone, ondansetron **OR** ondansetron (ZOFRAN) IV,  mouth rinse, oxyCODONE, traZODone  Antibiotics: Anti-infectives (From admission, onward)    Start     Dose/Rate Route Frequency Ordered Stop   08/02/21 1130  valACYclovir (VALTREX) tablet 1,000 mg        1,000 mg Oral Daily 08/02/21 1035     07/24/21 2230  cefTRIAXone (ROCEPHIN) 1 g in sodium chloride 0.9 % 100 mL IVPB  Status:  Discontinued        1 g 200 mL/hr over 30 Minutes Intravenous Every 24 hours 07/24/21 2130 07/27/21 0729   07/24/21 2230  azithromycin (ZITHROMAX) 500 mg in sodium chloride 0.9 % 250 mL IVPB  Status:  Discontinued        500 mg 250 mL/hr over 60 Minutes Intravenous Every 24 hours 07/24/21 2130 07/26/21 1415       Objective:  Vital Signs  Vitals:   08/05/21 0522 08/05/21 0523 08/05/21 0628 08/05/21 0832  BP: '97/67 97/67 96/75 '$ 97/75  Pulse: (!) 120   (!) 116  Resp: '18 20  20  '$ Temp: 98.4 F (36.9 C)   98.1 F (36.7 C)  TempSrc: Oral   Oral  SpO2: 98%   91%  Weight:      Height:       No intake or output data in the 24 hours ending 08/05/21 1009  Filed Weights   07/29/21 0500 08/02/21 0555 08/03/21 0336  Weight: 73 kg 70.6 kg 68.4 kg  General appearance: Awake alert.  In no distress Resp: Clear to auscultation bilaterally.  Normal effort Cardio: S1-S2 is tachycardic.  Premature beats heard. GI: Abdomen is soft.  Nontender nondistended.  Bowel sounds are present normal.  No masses organomegaly Extremities: No edema.  Full range of motion of lower extremities. Neurologic: Alert and oriented x3.  No focal neurological deficits.     Lab Results:  Data Reviewed: I have personally reviewed following labs and reports of the imaging studies  CBC: Recent Labs  Lab 07/30/21 0148 07/31/21 0207 08/01/21 0039 08/05/21 0327  WBC 14.4* 12.5* 9.4 10.2  NEUTROABS 12.0* 9.4* 6.6  --   HGB 12.9 13.0 12.3 13.1  HCT 39.8 40.8 38.2 40.0  MCV 108.2* 107.9* 107.6* 104.4*  PLT 477* 501* 482* 508*     Basic Metabolic Panel: Recent Labs  Lab  07/29/21 1857 07/30/21 0148 07/31/21 0207 08/01/21 0039 08/02/21 0050 08/04/21 0009 08/05/21 0327  NA 140 140 142 139 140 137 137  K 3.5 3.1* 3.2* 3.4* 3.5 3.7 4.0  CL 99 101 105 103 100 95* 101  CO2 '27 26 28 27 30 29 28  '$ GLUCOSE 82 77 77 78 104* 104* 100*  BUN 10 7* 5* <5* <5* <5* 5*  CREATININE 0.52 0.50 0.50 0.58 0.41* 0.53 0.59  CALCIUM 8.3* 8.1* 8.1* 7.9* 8.0* 8.8* 8.3*  MG 2.0 1.8 1.9 1.7  --  1.7  --      GFR: Estimated Creatinine Clearance: 67.7 mL/min (by C-G formula based on SCr of 0.59 mg/dL).  Liver Function Tests: Recent Labs  Lab 07/30/21 0148 07/31/21 0207 08/01/21 0039  AST '19 21 21  '$ ALT '13 13 12  '$ ALKPHOS 80 78 64  BILITOT 1.2 0.7 0.8  PROT 4.4* 4.6* 4.2*  ALBUMIN 2.2* 2.3* 2.1*      CBG: Recent Labs  Lab 08/01/21 0835 08/03/21 0632 08/04/21 0613  GLUCAP 81 124* 91      Recent Results (from the past 240 hour(s))  MRSA Next Gen by PCR, Nasal     Status: None   Collection Time: 07/28/21  2:25 PM   Specimen: Nasal Mucosa; Nasal Swab  Result Value Ref Range Status   MRSA by PCR Next Gen NOT DETECTED NOT DETECTED Final    Comment: (NOTE) The GeneXpert MRSA Assay (FDA approved for NASAL specimens only), is one component of a comprehensive MRSA colonization surveillance program. It is not intended to diagnose MRSA infection nor to guide or monitor treatment for MRSA infections. Test performance is not FDA approved in patients less than 92 years old. Performed at [facility], [place] [address], [place],  [address]       Radiology Studies: MR ABDOMEN MRCP WO CONTRAST  Result Date: 08/04/2021 CLINICAL DATA:  Pancreatitis EXAM: MRI ABDOMEN WITHOUT CONTRAST  (INCLUDING MRCP) TECHNIQUE: Multiplanar multisequence MR imaging of the abdomen was performed. Heavily T2-weighted images of the biliary and pancreatic ducts were obtained, and three-dimensional MRCP images were rendered by post processing. COMPARISON:  CT 07/27/2021 FINDINGS:  Lower chest: Bilateral pleural effusions and basilar atelectasis unchanged from comparison exam. Hepatobiliary: Several small lesions which are hyperintense on T2 weighted imaging most consistent small benign cysts (no IV contrast). Normal gallbladder. No biliary duct dilatation. Common bile duct is normal caliber. No evidence of hepatic steatosis on opposed phase imaging. Pancreas: There is ductal dilatation in the mid body and tail of the pancreas (image 22/4). There is a hypointense region on noncontrast T1 weighted imaging a  just downstream from the duct dilatation measuring 1.7 x 1.5 cm (image 52/series 22). No IV contrast administered. More distally in the distal body and head of the pancreas relatively normal parenchyma without duct dilatation. No variant ductal anatomy identified. ventral to the pancreatic duct dilatation is an oblong fluid collection measuring 4.7 x 2.1 cm which is similar to 5.2 s 3.1 cm on comparison CT. No evidence of hemorrhage within this collection. Larger cystic collection between pancreas and greater curvature of the stomach is decreased in volume measuring 8.2 x 4.7 cm (image 18/series 4) decreased from 11.7 by 8.6 cm. Spleen: The spleen is normal volume. Adrenals/urinary tract: Adrenal glands and kidneys are normal. Stomach/Bowel: The stomach and limited view of the bowel is unremarkable. Vascular/Lymphatic: Abdominal aorta is normal caliber. No periportal or retroperitoneal adenopathy. No pelvic adenopathy. Other: No free fluid. Musculoskeletal: No aggressive osseous lesion. IMPRESSION: 1. Reduction in volume of cystic collection along the greater curvature the stomach following cyst aspiration. 2. Smaller fluid collection adjacent to the body the pancreas is slightly decreased in size. Findings suggest pancreatic pseudocysts. 3. Duct dilatation in the distal body and tail the pancreas with potential lesion at the mid body of the pancreas. Differential includes focal pancreatitis  versus PANCREATIC NEOPLASM. No IV contrast administered. Recommend correlation with tumor markers and follow-up MRI imaging with contrast when appropriate versus investigation with endoscopic ultrasound. 4. No gallstones, choledocholithiasis or duct dilatation. 5. Benign hepatic cysts.  NO EVIDENCE OF HEPATIC STEATOSIS. 6. Stable bilateral pleural effusions and basilar atelectasis. Electronically Signed   By: Suzy Bouchard M.D.   On: 08/04/2021 08:47       LOS: 12 days   [facility] Pager on [email or web address]  08/05/2021, 10:09 AM

## 2021-08-05 NOTE — Consult Note (Incomplete)
CARDIOLOGY CONSULT NOTE  Patient ID: Kathryn Underwood MRN: [id number] DOB/AGE: March 05, 1960 61 y.o.  Admit date: 07/24/2021 Referring Physician: [facility] Reason for Consultation:  Tachcyardia  HPI:   61 y.o. *** female  with ***  Past Medical History:  Diagnosis Date   Alcohol abuse    Anxiety    Chickenpox    Depression    Neuromuscular disorder ([place])    neuropathy   Substance abuse ([place])      Past Surgical History:  Procedure Laterality Date   CESAREAN SECTION     TONSILLECTOMY       *** Family History  Problem Relation Age of Onset   Dementia Mother    Arthritis Father    Asthma Father    Asthma Brother    Diabetes Maternal Grandmother    Dementia Maternal Grandmother    Diabetes Paternal Grandmother    Parkinson's disease Maternal Grandfather    Parkinson's disease Paternal Grandfather    Breast cancer Neg Hx    Colon cancer Neg Hx      Social History: Social History   Socioeconomic History   Marital status: Divorced    Spouse name: Not on file   Number of children: Not on file   Years of education: Not on file   Highest education level: Not on file  Occupational History   Not on file  Tobacco Use   Smoking status: Every Day    Packs/day: 0.50    Types: Cigarettes   Smokeless tobacco: Never  Substance and Sexual Activity   Alcohol use: Yes    Alcohol/week: 16.0 standard drinks of alcohol    Types: 16 Glasses of wine per week    Comment: daily (1.5 liter bottle wine every 12)   Drug use: Yes    Types: Cocaine, Marijuana   Sexual activity: Yes    Partners: Male    Comment: female partner  Other Topics Concern   Not on file  Social History Narrative   Divorced. Homemaker. HS grad.    Lives with life partner (stan)   Drinks caffeine.   Wears seatbelt. Smoke dectector in the home. No firearms in the home.    Feels safe in her relationships.     Social Determinants of Health   Financial Resource Strain: Not on file  Food Insecurity:  Not on file  Transportation Needs: Not on file  Physical Activity: Not on file  Stress: Not on file  Social Connections: Not on file  Intimate Partner Violence: Not on file     Medications Prior to Admission  Medication Sig Dispense Refill Last Dose   Eszopiclone 3 MG TABS Take 3 mg by mouth at bedtime as needed for sleep. Take immediately before bedtime      gabapentin (NEURONTIN) 300 MG capsule Take 2 capsules (600 mg total) by mouth 2 (two) times daily. 60 capsule 0 07/24/2021   mirtazapine (REMERON) 45 MG tablet Take 45 mg by mouth at bedtime.   07/23/2021   QUEtiapine (SEROQUEL) 50 MG tablet Take 1 tablet by mouth at bedtime.   07/23/2021   chlordiazePOXIDE (LIBRIUM) 25 MG capsule '50mg'$  PO TID x 1D, then 25-'50mg'$  PO BID X 1D, then 25-'50mg'$  PO QD X 1D (Patient not taking: Reported on 07/23/2021) 10 capsule 0    cyclobenzaprine (FLEXERIL) 10 MG tablet Take 1 tablet (10 mg total) by mouth 3 (three) times daily as needed for muscle spasms. (Patient not taking: Reported on 07/23/2021) 20 tablet 0    folic  acid (FOLVITE) 1 MG tablet Take 1 tablet (1 mg total) by mouth daily. (Patient not taking: Reported on 07/23/2021) 30 tablet 0    Multiple Vitamin (MULTIVITAMIN WITH MINERALS) TABS tablet Take 1 tablet by mouth daily. (Patient not taking: Reported on 07/24/2021)   Not Taking   nicotine (NICODERM CQ - DOSED IN MG/24 HOURS) 21 mg/24hr patch Place 1 patch (21 mg total) onto the skin daily. (Patient not taking: Reported on 07/23/2021) 28 patch 0    pantoprazole (PROTONIX) 20 MG tablet Take 1 tablet (20 mg total) by mouth daily. (Patient not taking: Reported on 07/23/2021) 30 tablet 0    potassium chloride SA (K-DUR,KLOR-CON) 20 MEQ tablet Take 1 tablet (20 mEq total) by mouth daily. (Patient not taking: Reported on 07/23/2021) 7 tablet 0    sucralfate (CARAFATE) 1 GM/10ML suspension Take 10 mLs (1 g total) by mouth 4 (four) times daily -  with meals and at bedtime. (Patient not taking: Reported on 07/23/2021)  420 mL 0    thiamine 100 MG tablet Take 1 tablet (100 mg total) by mouth daily. (Patient not taking: Reported on 07/23/2021)       ROS    Physical Exam: Physical Exam     Imaging/tests reviewed and independently interpreted: Lab Results: ***  Cardiac Studies:  ***Telemetry ***  EKG ***:  Echocardiogram ***:  ***  Assessment & Recommendations:  ***  ***  ***Discussed interpretation of tests and management recommendations with the primary team     Nigel Mormon, MD Pager: [phone] Office: [phone]

## 2021-08-05 NOTE — Telephone Encounter (Signed)
-----   Message from Milus Banister, MD sent at 08/05/2021  5:42 AM EDT ----- I agree, EUS is a good idea.  We'll look for something next 2-4 weeks.  Kathryn Underwood, See below. She's probably going home soon. Needs upper EUS with me or Gabe in 2-4 weeks for abnormal pancreas, recent acute pancreatitis.  Thanks  ----- Message ----- From: Lavena Bullion, DO Sent: 08/04/2021   5:56 PM EDT To: Milus Banister, MD; #  Patient currently admitted with EtOH pancreatitis c/b pancreatic pseudocyst.  IR performed cyst drainage of one of the pseudocysts.  Pain and nausea/vomiting resolved.  MRI yesterday with concern for mass in the neck/uncinate of the pancreas with upstream PD dilation.  Could be related to the underlying EtOH pancreatitis, but also discussed distinct possibility of pancreatic cancer with the patient and spouse today.  She is otherwise improving clinically and suspect can be discharged in the near future.  Hoping to start arranging outpatient EUS.  I think it will not hurt to allow couple weeks for the acute (and likely superimposed on chronic) pancreatitis changes to heal and possibly improve endosonographic images, but I certainly defer to your expertise.  Thanks.  VC

## 2021-08-05 NOTE — Progress Notes (Signed)
Pt HR reaching 160s-170s. Will come down for brief periods, then increase again. MD notified, PRN metoprolol administered.  Raelyn Number, RN

## 2021-08-05 NOTE — Consult Note (Addendum)
CARDIOLOGY CONSULT NOTE  Patient ID: Kathryn Underwood MRN: [id number] DOB/AGE: [birth year] 61 y.o.  Admit date: 07/24/2021 Referring Physician: [facility] hospitalist Reason for Consultation: Tachycardia  HPI:   61 year old Caucasian female with alcohol abuse, nicotine dependence-recently quit, anxiety, depression, GERD, admitted with alcoholic pancreatitis/pancreatic pseudocyst.  Cardiology consulted for management of tachyarrhythmia.  Patient has been hospitalized since 07/24/2021.  Patient was advised on serial CT scans aspiration.  Pancreatic fluid after suspected pancreatic hemorrhage.  She denies any complaints of chest pain, shortness of breath.  She is intermittent palpitations, similar to what she has had in the past.  She has not seen a doctor in a long time, therefore there is no recent EKG for comparison prior to this hospitalization.  Past Medical History:  Diagnosis Date   Alcohol abuse    Anxiety    Chickenpox    Depression    Neuromuscular disorder ([place])    neuropathy   Substance abuse ([place])      Past Surgical History:  Procedure Laterality Date   CESAREAN SECTION     TONSILLECTOMY        Family History  Problem Relation Age of Onset   Dementia Mother    Arthritis Father    Asthma Father    Asthma Brother    Diabetes Maternal Grandmother    Dementia Maternal Grandmother    Diabetes Paternal Grandmother    Parkinson's disease Maternal Grandfather    Parkinson's disease Paternal Grandfather    Breast cancer Neg Hx    Colon cancer Neg Hx      Social History: Social History   Socioeconomic History   Marital status: Divorced    Spouse name: Not on file   Number of children: Not on file   Years of education: Not on file   Highest education level: Not on file  Occupational History   Not on file  Tobacco Use   Smoking status: Every Day    Packs/day: 0.50    Types: Cigarettes   Smokeless tobacco: Never  Substance and Sexual Activity   Alcohol use: Yes     Alcohol/week: 16.0 standard drinks of alcohol    Types: 16 Glasses of wine per week    Comment: daily (1.5 liter bottle wine every 12)   Drug use: Yes    Types: Cocaine, Marijuana   Sexual activity: Yes    Partners: Male    Comment: female partner  Other Topics Concern   Not on file  Social History Narrative   Divorced. Homemaker. HS grad.    Lives with life partner (stan)   Drinks caffeine.   Wears seatbelt. Smoke dectector in the home. No firearms in the home.    Feels safe in her relationships.     Social Determinants of Health   Financial Resource Strain: Not on file  Food Insecurity: Not on file  Transportation Needs: Not on file  Physical Activity: Not on file  Stress: Not on file  Social Connections: Not on file  Intimate Partner Violence: Not on file     Medications Prior to Admission  Medication Sig Dispense Refill Last Dose   Eszopiclone 3 MG TABS Take 3 mg by mouth at bedtime as needed for sleep. Take immediately before bedtime      gabapentin (NEURONTIN) 300 MG capsule Take 2 capsules (600 mg total) by mouth 2 (two) times daily. 60 capsule 0 07/24/2021   mirtazapine (REMERON) 45 MG tablet Take 45 mg by mouth at bedtime.   07/23/2021  QUEtiapine (SEROQUEL) 50 MG tablet Take 1 tablet by mouth at bedtime.   07/23/2021   chlordiazePOXIDE (LIBRIUM) 25 MG capsule '50mg'$  PO TID x 1D, then 25-'50mg'$  PO BID X 1D, then 25-'50mg'$  PO QD X 1D (Patient not taking: Reported on 07/23/2021) 10 capsule 0    cyclobenzaprine (FLEXERIL) 10 MG tablet Take 1 tablet (10 mg total) by mouth 3 (three) times daily as needed for muscle spasms. (Patient not taking: Reported on 07/23/2021) 20 tablet 0    folic acid (FOLVITE) 1 MG tablet Take 1 tablet (1 mg total) by mouth daily. (Patient not taking: Reported on 07/23/2021) 30 tablet 0    Multiple Vitamin (MULTIVITAMIN WITH MINERALS) TABS tablet Take 1 tablet by mouth daily. (Patient not taking: Reported on 07/24/2021)   Not Taking   nicotine (NICODERM CQ -  DOSED IN MG/24 HOURS) 21 mg/24hr patch Place 1 patch (21 mg total) onto the skin daily. (Patient not taking: Reported on 07/23/2021) 28 patch 0    pantoprazole (PROTONIX) 20 MG tablet Take 1 tablet (20 mg total) by mouth daily. (Patient not taking: Reported on 07/23/2021) 30 tablet 0    potassium chloride SA (K-DUR,KLOR-CON) 20 MEQ tablet Take 1 tablet (20 mEq total) by mouth daily. (Patient not taking: Reported on 07/23/2021) 7 tablet 0    sucralfate (CARAFATE) 1 GM/10ML suspension Take 10 mLs (1 g total) by mouth 4 (four) times daily -  with meals and at bedtime. (Patient not taking: Reported on 07/23/2021) 420 mL 0    thiamine 100 MG tablet Take 1 tablet (100 mg total) by mouth daily. (Patient not taking: Reported on 07/23/2021)       Review of Systems  Cardiovascular:  Positive for palpitations. Negative for chest pain, dyspnea on exertion, leg swelling and syncope.  Gastrointestinal:  Positive for abdominal pain.      Physical Exam: Physical Exam Vitals and nursing note reviewed.  Constitutional:      General: She is not in acute distress. Neck:     Vascular: No JVD.  Cardiovascular:     Rate and Rhythm: Tachycardia present. Rhythm irregular.     Heart sounds: Normal heart sounds. No murmur heard. Pulmonary:     Effort: Pulmonary effort is normal.     Breath sounds: Normal breath sounds. No wheezing or rales.        Imaging/tests reviewed and independently interpreted: Lab Results: CBC, BMP  Cardiac Studies:  Telemetry 08/05/2021: Multifocal ventricular cardia, rate ranging from 100-130 bpm  EKG 08/05/2021: Multifocal atrial tachycardia 127 bpm Low voltage  Echocardiogram: None   Assessment & Recommendations:  61 year old Caucasian female with alcohol abuse, nicotine dependence-recently quit, anxiety, depression, GERD, admitted with alcoholic pancreatitis/pancreatic pseudocyst.  Cardiology consulted for management of tachyarrhythmia.  MAT: Likely emanating from  underlying COPD, previously undiagnosed, but fairly well controlled as per pulmonology. Acute illness hypoxia, may be contributing. Blood pressure on the lower side, thus unable to use high-dose of AV blocking agents such as beta-blocker or calcium channel blocker.s In all likelihood, she may have had this rhythm for a long time, but diagnosed.  Nonetheless, it is important to control the long-term risk of tachyarrhythmia induced cardiomyopathy. Our best option may be amiodarone, in spite of the risk of long-term toxicities. Recommend p.o. 200 mg twice daily, with plans to reduce to 200 mg daily after 7 days.  In future, would like to reduce to 100 mg daily for maintenance, if it works well. Baseline LFTs with low albumin and protein, but  otherwise normal. TSH 6.3, without.  This will need to be monitored closely while on amiodarone. Given outpatient PFT with PCP/pulmonology. Once rate better controlled, recommend obtaining echocardiogram.  Rest of the management as per primary team.  Discussed interpretation of tests and management recommendations with the primary team     Nigel Mormon, MD Pager: [phone] Office: [phone]

## 2021-08-05 NOTE — Telephone Encounter (Signed)
EUS scheduled for 7/20 at 730 am with DJ  All information sent to the pt home address and will be given to the pt when discharged from the hospital.

## 2021-08-05 NOTE — Progress Notes (Signed)
*  PRELIMINARY RESULTS* Echocardiogram 2D Echocardiogram has been performed.  Kathryn Underwood 08/05/2021, 4:29 PM

## 2021-08-06 ENCOUNTER — Other Ambulatory Visit (HOSPITAL_COMMUNITY): Payer: Self-pay

## 2021-08-06 LAB — CBC
HCT: 36 % (ref 36.0–46.0)
Hemoglobin: 11.7 g/dL — ABNORMAL LOW (ref 12.0–15.0)
MCH: 34.2 pg — ABNORMAL HIGH (ref 26.0–34.0)
MCHC: 32.5 g/dL (ref 30.0–36.0)
MCV: 105.3 fL — ABNORMAL HIGH (ref 80.0–100.0)
Platelets: 521 10*3/uL — ABNORMAL HIGH (ref 150–400)
RBC: 3.42 MIL/uL — ABNORMAL LOW (ref 3.87–5.11)
RDW: 13.5 % (ref 11.5–15.5)
WBC: 8.6 10*3/uL (ref 4.0–10.5)
nRBC: 0 % (ref 0.0–0.2)

## 2021-08-06 LAB — BASIC METABOLIC PANEL
Anion gap: 11 (ref 5–15)
BUN: 5 mg/dL — ABNORMAL LOW (ref 8–23)
CO2: 26 mmol/L (ref 22–32)
Calcium: 8.4 mg/dL — ABNORMAL LOW (ref 8.9–10.3)
Chloride: 103 mmol/L (ref 98–111)
Creatinine, Ser: 0.41 mg/dL — ABNORMAL LOW (ref 0.44–1.00)
GFR, Estimated: >60 mL/min (ref >60)
Glucose, Bld: 103 mg/dL — ABNORMAL HIGH (ref 70–99)
Potassium: 3.3 mmol/L — ABNORMAL LOW (ref 3.5–5.1)
Sodium: 140 mmol/L (ref 135–145)

## 2021-08-06 LAB — MAGNESIUM: Magnesium: 2 mg/dL (ref 1.7–2.4)

## 2021-08-06 MED ORDER — ALBUTEROL SULFATE HFA 108 (90 BASE) MCG/ACT IN AERS
2.0000 | INHALATION_SPRAY | Freq: Four times a day (QID) | RESPIRATORY_TRACT | 1 refills | Status: DC | PRN
  Filled 2021-08-06: qty 8.5, 25d supply, fill #0

## 2021-08-06 MED ORDER — FOLIC ACID 1 MG PO TABS
1.0000 mg | ORAL_TABLET | Freq: Every day | ORAL | 1 refills | Status: DC
  Filled 2021-08-06: qty 30, 30d supply, fill #0

## 2021-08-06 MED ORDER — AMIODARONE HCL 200 MG PO TABS
200.0000 mg | ORAL_TABLET | Freq: Two times a day (BID) | ORAL | 0 refills | Status: DC
Start: 2021-08-06 — End: 2021-09-01
  Filled 2021-08-06: qty 12, 6d supply, fill #0

## 2021-08-06 MED ORDER — GERHARDT'S BUTT CREAM
1.0000 | TOPICAL_CREAM | Freq: Two times a day (BID) | CUTANEOUS | 0 refills | Status: AC
  Filled 2021-08-06: qty 14, 7d supply, fill #0

## 2021-08-06 MED ORDER — POTASSIUM CHLORIDE CRYS ER 20 MEQ PO TBCR
40.0000 meq | EXTENDED_RELEASE_TABLET | Freq: Once | ORAL | Status: AC
  Administered 2021-08-06: 40 meq via ORAL
  Filled 2021-08-06: qty 2

## 2021-08-06 MED ORDER — MIRTAZAPINE 15 MG PO TABS
15.0000 mg | ORAL_TABLET | Freq: Every day | ORAL | 1 refills | Status: DC
  Filled 2021-08-06: qty 30, 30d supply, fill #0

## 2021-08-06 MED ORDER — AMIODARONE HCL 200 MG PO TABS
200.0000 mg | ORAL_TABLET | Freq: Every day | ORAL | 1 refills | Status: DC
Start: 2021-08-12 — End: 2021-09-01
  Filled 2021-08-06: qty 30, 30d supply, fill #0

## 2021-08-06 MED ORDER — VALACYCLOVIR HCL 1 G PO TABS
1000.0000 mg | ORAL_TABLET | Freq: Every day | ORAL | 0 refills | Status: AC
Start: 2021-08-07 — End: 2021-08-14
  Filled 2021-08-06: qty 7, 7d supply, fill #0

## 2021-08-06 MED ORDER — THIAMINE HCL 100 MG PO TABS
100.0000 mg | ORAL_TABLET | Freq: Every day | ORAL | 1 refills | Status: DC
  Filled 2021-08-06: qty 30, 30d supply, fill #0

## 2021-08-06 MED ORDER — QUETIAPINE FUMARATE 25 MG PO TABS
25.0000 mg | ORAL_TABLET | Freq: Every day | ORAL | 1 refills | Status: DC
  Filled 2021-08-06: qty 30, 30d supply, fill #0

## 2021-08-06 MED ORDER — MOMETASONE FURO-FORMOTEROL FUM 200-5 MCG/ACT IN AERO
2.0000 | INHALATION_SPRAY | Freq: Two times a day (BID) | RESPIRATORY_TRACT | 1 refills | Status: DC
  Filled 2021-08-06: qty 13, 30d supply, fill #0

## 2021-08-06 NOTE — Progress Notes (Signed)
Rate better controlled. Continue amiodarone 200 mg bid for 7 days, then 200 mg daily. Will arrange outpatient follow up.   Nigel Mormon, MD Pager: [phone] Office: [phone]

## 2021-08-06 NOTE — Progress Notes (Signed)
OT Cancellation Note  Patient Details Name: Kathryn Underwood MRN: [id number] DOB: May 26, 1960   Cancelled Treatment:    Reason Eval/Treat Not Completed: Other (comment) (pt politely declined session reporting she has already taken a good walk with PT and declined ADL participation at this time.) will check back as time allows for OT intervention.   Harley Alto., COTA/L Acute Rehabilitation Services [phone]   [name] 08/06/2021, 1:48 PM

## 2021-08-06 NOTE — Discharge Summary (Signed)
Physician Discharge Summary  COREAN YOSHIMURA YQM:[id number] DOB: 1960/09/07 DOA: 07/24/2021  PCP: Fredirick Lathe, PA-C  Admit date: 07/24/2021 Discharge date: 08/06/2021  Admitted From: Home Disposition:  Home  Discharge Condition:Stable CODE STATUS:FULL Diet recommendation: Regular  Brief/Interim Summary: 61 year old female with past medical history of anxiety, depression, GERD, alcohol use drinks 1 wine bottle per day presented with abdominal pain ongoing for 2 weeks. Found to have acute pancreatitis as well as pancreatic cyst.  IR was consulted and she underwent drainage pancreatic cyst.  Abdomen pain gradually improved.  Currently hemodynamically stable for discharge.  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  Patient seems to be stable for the most part.  Abdominal CT scan was repeated on 7/2 which showed evidence of pancreatic hemorrhage and lesion along with worsening cirrhosis. Interventional radiology was consulted.  Patient underwent aspiration of the peripancreatic fluid collection on 7/6.  1300 mL of fluid was removed. Diarrhea likely due to pancreatitis and reinitiation of diet.  Diarrhea has resolved.  Abdomen remains benign.  She is tolerating a diet    Questionable pancreatic lesion MRI shows reduction in the volume of the cystic collection after aspiration.  Small fluid collection is also noted.  This is adjacent to the body of the pancreas.  Ductal dilatation in the distal body and tail of the pancreas with potential lesion at the mid body of the pancreas appreciated.  Unclear why no contrast was given since MRI was ordered with contrast. CA 19-9 is normal at 13.   Gastroenterology to arrange outpatient EUS.  This has been arranged for July 28 at 7:30 AM with Dr. Ardis Hughs.   MAT/Sinus tachycardia/hypotension Started on amiodarone as per cardiology,  she will follow-up with cardiology as an outpatient.  Echo showed EF of 50 to 52%, grade 1 diastolic dysfunction. currently rate is well controlled.  Notable beta-blocker or calcium channel blocker due to soft blood pressure.  Acute metabolic encephalopathy Resolved   Alcohol dependence Consult cessation.  Continue thiamine and folic acid   Acute hypoxic and hypercapnic respiratory failure Currently on room air.   History of COPD Had mild acute exacerbation few days ago.  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  No anticoagulation due to concern for pancreatic hemorrhage.   Left pulmonary nodule Will require outpatient surveillance.   Uterine fibroid Incidentally noted.  Outpatient follow-up.  History of depression and anxiety Home medications including Seroquel ,Remeron, gabapentin    Herpes zoster Lesions noted in her sacral area consistent with herpes.  Localized . No need for airborne precaution. started on Valtrex.  Debility/deconditioning: PT recommended home health on discharge       Discharge Diagnoses:  Principal Problem:   Alcohol induced acute pancreatitis without necrosis or infection Active Problems:   Pancreatic pseudocyst   Pancreas hemorrhage   Alcohol dependence with unspecified alcohol-induced disorder (HCC)   Delirium tremens (HCC)   Thrombosis of splenic artery (HCC)   Sinus tachycardia   COPD (chronic obstructive pulmonary disease) (HCC)   Uterine fibroid   Pulmonary nodule   Depression with anxiety   Insomnia    Discharge Instructions  Discharge Instructions     Diet - low sodium heart healthy   Complete by: As directed    Diet general   Complete by: As directed    Discharge instructions   Complete by: As directed    1)Please take prescribed medications as instructed  2)Quit alcohol 3)You will be called by cardiology as an outpatient. 4) follow-up with your PCP in 1 -2 weeks    Increase activity slowly   Complete by: As directed    No wound care   Complete by: As directed       Allergies as of 08/06/2021   No Known Allergies      Medication List     TAKE these medications    albuterol 108 (90 Base) MCG/ACT inhaler Commonly known as: VENTOLIN HFA Inhale 2 puffs into the lungs every 6 (six) hours as needed for wheezing or shortness of breath.   amiodarone 200 MG tablet Commonly known as: PACERONE Take 1 tablet (200 mg total) by mouth 2 (two) times daily for 6 days.   amiodarone 200 MG tablet Commonly known as: Pacerone Take 1 tablet (200 mg total) by mouth daily. Start taking on: August 12, 2021   Eszopiclone 3 MG Tabs Take 3 mg by mouth at bedtime as needed for sleep. Take immediately before bedtime   folic acid 1 MG tablet Commonly known as: FOLVITE Take 1 tablet (1 mg total) by mouth daily. Start taking on: August 07, 2021   gabapentin 300 MG capsule Commonly known as: NEURONTIN Take 2 capsules (600 mg total) by mouth 2 (two) times daily.   Gerhardt's butt cream Crea Apply 1 Application topically 2 (two) times daily for 7 days.   mirtazapine 15 MG tablet Commonly known as: REMERON Take 1 tablet (15 mg total) by mouth at bedtime. What changed:  medication strength how much to take   QUEtiapine 25 MG tablet Commonly known as: SEROQUEL Take 1 tablet (25 mg total) by mouth at bedtime. What changed:  medication strength how much to take   thiamine 100 MG tablet Take 1 tablet (100 mg total) by mouth daily. Start taking on: August 07, 2021   valACYclovir 1000 MG tablet Commonly known as: VALTREX Take 1 tablet (1,000 mg total) by mouth daily for 7 days. Start taking on: August 07, 2021        Follow-up Information     Nigel Mormon, MD Follow up on 09/01/2021.   Specialties: Cardiology, Radiology Why: 8:45 PM Contact information: [place] [address] [phone]         Allwardt,  Randa Evens, PA-C. Schedule an appointment as soon as possible for a visit in 1 week(s).   Specialty: Physician Assistant Contact information: [place] [address] [phone]                No Known Allergies  Consultations: GI, IR   Procedures/Studies: ECHOCARDIOGRAM COMPLETE  Result Date: 08/05/2021    ECHOCARDIOGRAM REPORT   Patient Name:   BERDENA CISEK Date of Exam: 08/05/2021 Medical Rec #:  [id number]        Height:       63.0 in Accession #:    [id number]       Weight:       150.8 lb Date of Birth:  1960/11/06         BSA:          1.715 m Patient Age:    37 years         BP:           105/75 mmHg Patient Gender: F                HR:  104 bpm. Exam Location:  Inpatient Procedure: 2D Echo, Cardiac Doppler and Color Doppler Indications:    Dyspnea R06.00, Persistent sinus tach  History:        Patient has no prior history of Echocardiogram examinations.                 COPD; Risk Factors:Current Smoker. Alcohol induced acute                 pancreatitis without necrosis or infection, Alcohol and                 Substance abuse ([place]) (From Hx).  Sonographer:    Alvino Chapel RCS Referring Phys: [place]  1. Left ventricular ejection fraction, by estimation, is 50 to 55%. The left ventricle has low normal function. The left ventricle has no regional wall motion abnormalities. Left ventricular diastolic parameters are consistent with Grade I diastolic dysfunction (impaired relaxation).  2. Right ventricular systolic function is normal. The right ventricular size is normal.  3. The mitral valve is normal in structure. No evidence of mitral valve regurgitation.  4. The aortic valve is tricuspid. Aortic valve regurgitation is not visualized. No aortic stenosis is present.  5. The inferior vena cava is normal in size with greater than 50% respiratory variability, suggesting right atrial pressure of 3 mmHg. Comparison(s): Incessant PACs are  seen during the study. FINDINGS  Left Ventricle: Left ventricular ejection fraction, by estimation, is 50 to 55%. The left ventricle has low normal function. The left ventricle has no regional wall motion abnormalities. The left ventricular internal cavity size was normal in size. There is borderline concentric left ventricular hypertrophy. Left ventricular diastolic parameters are consistent with Grade I diastolic dysfunction (impaired relaxation). Indeterminate filling pressures. Right Ventricle: The right ventricular size is normal. No increase in right ventricular wall thickness. Right ventricular systolic function is normal. Left Atrium: Left atrial size was normal in size. Right Atrium: Right atrial size was normal in size. Pericardium: Trivial pericardial effusion is present. The pericardial effusion is anterior to the right ventricle. Mitral Valve: The mitral valve is normal in structure. No evidence of mitral valve regurgitation. Tricuspid Valve: The tricuspid valve is normal in structure. Tricuspid valve regurgitation is trivial. Aortic Valve: The aortic valve is tricuspid. Aortic valve regurgitation is not visualized. No aortic stenosis is present. Pulmonic Valve: The pulmonic valve was grossly normal. Pulmonic valve regurgitation is not visualized. Aorta: The aortic root and ascending aorta are structurally normal, with no evidence of dilitation. Venous: The inferior vena cava is normal in size with greater than 50% respiratory variability, suggesting right atrial pressure of 3 mmHg. IAS/Shunts: No atrial level shunt detected by color flow Doppler.  LEFT VENTRICLE PLAX 2D LVIDd:         4.10 cm   Diastology LVIDs:         3.00 cm   LV e' medial:    6.85 cm/s LV PW:         1.30 cm   LV E/e' medial:  11.9 LV IVS:        1.10 cm   LV e' lateral:   6.64 cm/s LVOT diam:     2.00 cm   LV E/e' lateral: 12.3 LV SV:         49 LV SV Index:   28 LVOT Area:     3.14 cm  RIGHT VENTRICLE RV S prime:     13.07 cm/s  TAPSE (M-mode): 1.8  cm LEFT ATRIUM             Index        RIGHT ATRIUM           Index LA diam:        3.10 cm 1.81 cm/m   RA Area:     12.90 cm LA Vol (A2C):   44.1 ml 25.71 ml/m  RA Volume:   29.90 ml  17.43 ml/m LA Vol (A4C):   35.1 ml 20.47 ml/m LA Biplane Vol: 40.0 ml 23.32 ml/m  AORTIC VALVE LVOT Vmax:   82.70 cm/s LVOT Vmean:  52.400 cm/s LVOT VTI:    0.155 m  AORTA Ao Root diam: 3.60 cm MITRAL VALVE MV Area (PHT): 3.31 cm    SHUNTS MV Decel Time: 230 msec    Systemic VTI:  0.16 m MV E velocity: 81.55 cm/s  Systemic Diam: 2.00 cm MV A velocity: 80.60 cm/s MV E/A ratio:  1.01 Mihai Croitoru MD Electronically signed by Sanda Klein MD Signature Date/Time: 08/05/2021/5:37:56 PM    Final    MR ABDOMEN MRCP WO CONTRAST  Result Date: 08/04/2021 CLINICAL DATA:  Pancreatitis EXAM: MRI ABDOMEN WITHOUT CONTRAST  (INCLUDING MRCP) TECHNIQUE: Multiplanar multisequence MR imaging of the abdomen was performed. Heavily T2-weighted images of the biliary and pancreatic ducts were obtained, and three-dimensional MRCP images were rendered by post processing. COMPARISON:  CT 07/27/2021 FINDINGS: Lower chest: Bilateral pleural effusions and basilar atelectasis unchanged from comparison exam. Hepatobiliary: Several small lesions which are hyperintense on T2 weighted imaging most consistent small benign cysts (no IV contrast). Normal gallbladder. No biliary duct dilatation. Common bile duct is normal caliber. No evidence of hepatic steatosis on opposed phase imaging. Pancreas: There is ductal dilatation in the mid body and tail of the pancreas (image 22/4). There is a hypointense region on noncontrast T1 weighted imaging a just downstream from the duct dilatation measuring 1.7 x 1.5 cm (image 52/series 22). No IV contrast administered. More distally in the distal body and head of the pancreas relatively normal parenchyma without duct dilatation. No variant ductal anatomy identified. ventral to the pancreatic duct  dilatation is an oblong fluid collection measuring 4.7 x 2.1 cm which is similar to 5.2 s 3.1 cm on comparison CT. No evidence of hemorrhage within this collection. Larger cystic collection between pancreas and greater curvature of the stomach is decreased in volume measuring 8.2 x 4.7 cm (image 18/series 4) decreased from 11.7 by 8.6 cm. Spleen: The spleen is normal volume. Adrenals/urinary tract: Adrenal glands and kidneys are normal. Stomach/Bowel: The stomach and limited view of the bowel is unremarkable. Vascular/Lymphatic: Abdominal aorta is normal caliber. No periportal or retroperitoneal adenopathy. No pelvic adenopathy. Other: No free fluid. Musculoskeletal: No aggressive osseous lesion. IMPRESSION: 1. Reduction in volume of cystic collection along the greater curvature the stomach following cyst aspiration. 2. Smaller fluid collection adjacent to the body the pancreas is slightly decreased in size. Findings suggest pancreatic pseudocysts. 3. Duct dilatation in the distal body and tail the pancreas with potential lesion at the mid body of the pancreas. Differential includes focal pancreatitis versus PANCREATIC NEOPLASM. No IV contrast administered. Recommend correlation with tumor markers and follow-up MRI imaging with contrast when appropriate versus investigation with endoscopic ultrasound. 4. No gallstones, choledocholithiasis or duct dilatation. 5. Benign hepatic cysts.  NO EVIDENCE OF HEPATIC STEATOSIS. 6. Stable bilateral pleural effusions and basilar atelectasis. Electronically Signed   By: Suzy Bouchard M.D.   On: 08/04/2021 08:47  DG CHEST PORT 1 VIEW  Result Date: 08/01/2021 CLINICAL DATA:  Dyspnea EXAM: PORTABLE CHEST 1 VIEW COMPARISON:  07/29/2021 FINDINGS: Patchy bibasilar atelectasis/consolidation. Small right pleural effusion. Heart size is normal for technique. IMPRESSION: Patchy bibasilar atelectasis/consolidation. Small right pleural effusion. Electronically Signed   By: Macy Mis M.D.   On: 08/01/2021 08:18   CT ASPIRATION  Result Date: 07/31/2021 INDICATION: 61 year old woman with peripancreatic fluid collection presents to IR for CT-guided aspiration. EXAM: CT guided peripancreatic fluid drainage MEDICATIONS: The patient is currently admitted to the hospital and receiving intravenous antibiotics. The antibiotics were administered within an appropriate time frame prior to the initiation of the procedure. ANESTHESIA/SEDATION: 0.5 mg IV Versed Moderate Sedation Time:  13 minutes The patient was continuously monitored during the procedure by the interventional radiology nurse under my direct supervision. COMPLICATIONS: None immediate. TECHNIQUE: Informed written consent was obtained from the patient after a thorough discussion of the procedural risks, benefits and alternatives. All questions were addressed. Maximal Sterile Barrier Technique was utilized including caps, mask, sterile gowns, sterile gloves, sterile drape, hand hygiene and skin antiseptic. A timeout was performed prior to the initiation of the procedure. PROCEDURE: The left anterolateral abdominal wall was prepped with Chlorhexidine in a sterile fashion, and a sterile drape was applied covering the operative field. Sterile gown and sterile gloves were used for the procedure. Local anesthesia was provided with 1% Lidocaine. Utilizing CT guidance 19 ga Yueh needle was inserted into the peripancreatic fluid collection. 1.3 L of dark brown fluid was removed. Post aspiration CT demonstrated significant reduction in size of fluid collection with some fluid still remaining adjacent to the greater curvature of the stomach. Samples were sent for gram stain, culture, cytology and amylase. IMPRESSION: CT-guided aspiration of peripancreatic fluid collection. Electronically Signed   By: Miachel Roux M.D.   On: 07/31/2021 16:55   DG Abd Portable 1V  Result Date: 07/29/2021 CLINICAL DATA:  Abdominal pain and distension. EXAM:  PORTABLE ABDOMEN - 1 VIEW COMPARISON:  07/26/2021 radiograph and prior studies FINDINGS: The bowel gas pattern is normal.  No radio-opaque calculi are noted. Pelvic calcifications are compatible with small calcified fibroids. No acute bony abnormality noted. IMPRESSION: No acute abnormality. Unremarkable bowel gas pattern. Electronically Signed   By: Margarette Canada M.D.   On: 07/29/2021 18:30   DG CHEST PORT 1 VIEW  Result Date: 07/29/2021 CLINICAL DATA:  Acute respiratory failure EXAM: PORTABLE CHEST 1 VIEW COMPARISON:  07/28/2021 FINDINGS: 0508 hours. The cardio pericardial silhouette is enlarged. There is pulmonary vascular congestion without overt pulmonary edema. Improvement in aeration at the lung bases with persistent left base collapse/consolidation and tiny left pleural effusion. Telemetry leads overlie the chest. IMPRESSION: 1. Interval improvement in aeration at the lung bases. 2. Pulmonary vascular congestion without overt pulmonary edema. 3. Persistent left base collapse/consolidation and tiny left pleural effusion. Electronically Signed   By: Misty Stanley M.D.   On: 07/29/2021 10:35   DG Chest Port 1 View  Result Date: 07/28/2021 CLINICAL DATA:  Acute respiratory distress. EXAM: PORTABLE CHEST 1 VIEW COMPARISON:  Chest radiograph July 27, 2021. FINDINGS: Suspected small bilateral pleural effusions. Overlying bibasilar opacities. No visible pneumothorax. Mild enlargement the cardiac silhouette. No acute osseous abnormality. IMPRESSION: 1. Suspected small bilateral pleural effusions with slow overlying bibasilar atelectasis and/or pneumonia. 2. Mild cardiomegaly. Electronically Signed   By: Margaretha Sheffield M.D.   On: 07/28/2021 12:15   CT ABDOMEN PELVIS W CONTRAST  Result Date: 07/27/2021 CLINICAL DATA:  Pancreatitis,  acute, severe EXAM: CT ABDOMEN AND PELVIS WITH CONTRAST TECHNIQUE: Multidetector CT imaging of the abdomen and pelvis was performed using the standard protocol following bolus  administration of intravenous contrast. RADIATION DOSE REDUCTION: This exam was performed according to the departmental dose-optimization program which includes automated exposure control, adjustment of the mA and/or kV according to patient size and/or use of iterative reconstruction technique. CONTRAST:  44m OMNIPAQUE IOHEXOL 300 MG/ML  SOLN COMPARISON:  CT abdomen pelvis 07/24/2021 FINDINGS: Lower chest: Bilateral trace volume pleural effusions. Associated bilateral lower lobe passive atelectasis. Hepatobiliary: Pancreas: Previously identified peripancreatic lesion is now heterogeneous and slightly increased in size measuring 6.2 x 3.2 cm (from 5.7 x 2.7 cm). Increased heterogeneity of the lesion with density of 51 Hounsfield units. Focal main pancreatic duct dilatation measuring up to 5 mm. Spleen: Normal in size without focal abnormality. Adrenals/Urinary Tract: No adrenal nodule bilaterally. Bilateral kidneys enhance symmetrically. No hydronephrosis. No hydroureter. The urinary bladder is unremarkable. Stomach/Bowel: There is a 12 x 9 x 19 cm cystic lesion likely arising from the greater curvature of the stomach. No evidence of bowel wall thickening or dilatation. Appendix appears normal. Vascular/Lymphatic: Marked narrowing of the splenic artery along the posterior pancreas coarse if concern for nonocclusive thrombosis (3:35). No abdominal aorta or iliac aneurysm. Moderate to severe atherosclerotic plaque of the aorta and its branches. No abdominal, pelvic, or inguinal lymphadenopathy. Reproductive: Coarsely calcified uterine fibroids. Otherwise uterus and bilateral adnexa are unremarkable. Other: Trace free fluid within the abdomen and pelvis centered along the pancreas. No intraperitoneal free gas. No organized fluid collection. Musculoskeletal: Subcutaneus soft tissue edema. No suspicious lytic or blastic osseous lesions. No acute displaced fracture. Multilevel degenerative changes of the spine.  IMPRESSION: 1. Bilateral trace volume pleural effusions. 2. Previously identified peripancreatic lesion is now heterogeneous and slightly increased in size measuring 6.2 x 3.2 cm (from 5.7 x 2.7 cm). Interval increase in density within the lesion (50 Hounsfield units) is concerning for underlying hemorrhage. When the patient is clinically stable and able to follow directions and hold their breath (preferably as an outpatient) further evaluation with dedicated abdominal MRI pancreatic protocol should be considered. 3. Marked narrowing of the splenic artery along the posterior pancreas coarse if concern for nonocclusive thrombosis. 4. Trace free fluid within the abdomen and pelvis centered along the pancreas. Pancreatitis is not excluded. Correlate with lipase levels. 5. A 12 x 9 x 19 cm cystic lesion likely arising from the greater curvature of the stomach. Query gist versus pancreatic pseudocyst versus pancreatic neoplasm related to the above findings. These results will be called to the ordering clinician or representative by the Radiologist Assistant, and communication documented in the PACS or [employer] Electronically Signed   By: MIven FinnM.D.   On: 07/27/2021 16:39   DG CHEST PORT 1 VIEW  Result Date: 07/27/2021 CLINICAL DATA:  Shortness of breath.  Wheezing. EXAM: PORTABLE CHEST 1 VIEW COMPARISON:  07/24/2021 FINDINGS: Mild cardiomegaly shows increase since prior study. New mild diffuse interstitial infiltrates are suspicious for mild interstitial edema. New small bilateral pleural effusions and bibasilar atelectasis also noted. IMPRESSION: Mild acute congestive heart failure with small bilateral pleural effusions. Electronically Signed   By: JMarlaine HindM.D.   On: 07/27/2021 12:00   DG Abd Portable 1V  Result Date: 07/26/2021 CLINICAL DATA:  Abdominal distension. EXAM: PORTABLE ABDOMEN - 1 VIEW COMPARISON:  10/06/2017.  CT, 07/24/2021. FINDINGS: There is no bowel dilation to suggest  obstruction. Soft tissues are poorly defined.  No evidence of renal or ureteral stones. IMPRESSION: No acute findings or evidence of bowel obstruction. Electronically Signed   By: Lajean Manes M.D.   On: 07/26/2021 18:07   CT CHEST W CONTRAST  Result Date: 07/24/2021 CLINICAL DATA:  COPD exacerbation. EXAM: CT CHEST WITH CONTRAST TECHNIQUE: Multidetector CT imaging of the chest was performed during intravenous contrast administration. RADIATION DOSE REDUCTION: This exam was performed according to the departmental dose-optimization program which includes automated exposure control, adjustment of the mA and/or kV according to patient size and/or use of iterative reconstruction technique. CONTRAST:  21m OMNIPAQUE IOHEXOL 350 MG/ML SOLN COMPARISON:  CT abdomen and pelvis 07/24/2021 FINDINGS: Cardiovascular: No significant vascular findings. Normal heart size. No pericardial effusion. Coronary artery calcifications are present. Mediastinum/Nodes: No enlarged mediastinal, hilar, or axillary lymph nodes. Thyroid gland, trachea, and esophagus demonstrate no significant findings. Lungs/Pleura: There is a small amount of patchy atelectasis/airspace disease in the lingula. There are atelectatic changes in both lower lobes. There is no pleural effusion or pneumothorax. Ground-glass nodular density is seen in the left lung apex measuring 1 cm. Trachea and central airways are within normal limits. Upper Abdomen: Cystic lesion in the left upper quadrant is partially imaged. Please see CT abdomen and pelvis performed same day for further description. Musculoskeletal: No chest wall abnormality. No acute or significant osseous findings. IMPRESSION: 1. Minimal lingular atelectasis/airspace disease correlate clinically for infection. 2. 10 mm left ground-glass pulmonary nodule within the upper lobe. Recommend a non-contrast Chest CT at 6-12 months to confirm persistence, then additional non-contrast Chest CTs every 2 years until 5  years. If nodule grows or develops solid component(s), consider resection. These guidelines do not apply to immunocompromised patients and patients with cancer. Follow up in patients with significant comorbidities as clinically warranted. For lung cancer screening, adhere to Lung-RADS guidelines. Reference: Radiology. 2017; 284(1):228-43. Electronically Signed   By: ARonney AstersM.D.   On: 07/24/2021 23:31   CT ABDOMEN PELVIS W CONTRAST  Result Date: 07/24/2021 CLINICAL DATA:  Abdominal pain, acute.  Black diarrhea. EXAM: CT ABDOMEN AND PELVIS WITH CONTRAST TECHNIQUE: Multidetector CT imaging of the abdomen and pelvis was performed using the standard protocol following bolus administration of intravenous contrast. RADIATION DOSE REDUCTION: This exam was performed according to the departmental dose-optimization program which includes automated exposure control, adjustment of the mA and/or kV according to patient size and/or use of iterative reconstruction technique. CONTRAST:  821mOMNIPAQUE IOHEXOL 300 MG/ML  SOLN COMPARISON:  None Available. FINDINGS: Lower chest: Lung bases are clear. Hepatobiliary: Decreased attenuation of the liver is compatible with steatosis. Normal appearance of the gallbladder. There is a poorly defined low-density structure along the inferior right hepatic lobe adjacent to the gallbladder. This area measures approximately 2.2 cm. Additional small hypodensities throughout the liver. Some of these hypodensities could represent cysts but too small to definitively characterize. Trace perihepatic ascites. Main portal venous system is patent. No biliary dilatation. Normal appearance of the gallbladder. Pancreas: Poorly defined hypodensity in the pancreatic neck region, best seen on sequence 2 image 30. The pancreatic duct is dilated distal to this area of decreased attenuation in the pancreas. Normal appearance of the pancreatic head and uncinate process. Stranding anterior to the pancreatic  body and there is fluid collection with peripheral enhancement around the distal pancreatic body region that measures 2.7 cm in with sequence 2 image 31. Spleen: Normal in size without focal abnormality. Adrenals/Urinary Tract: Normal appearance of the adrenal glands. Normal appearance of the urinary bladder.  Normal appearance of both kidneys without stones, hydronephrosis or suspicious renal lesions. Stomach/Bowel: Massive low-density cystic lesion along the posterior aspect of the stomach which is markedly compressing the stomach lumen. This collection measures 23.9 x 9.1 x 10.5 cm. Normal appearance of the duodenum. There is no significant dilatation of the distal esophagus. No small bowel or colonic dilatation. Vascular/Lymphatic: Atherosclerosis in the abdominal aorta without aneurysm or dissection. No significant aortic stenosis. No significant lymph node enlargement in the abdomen or pelvis. Reproductive: Calcified uterine fibroids. No evidence for an adnexal lesion. Other: Small amount of free fluid in the right hemipelvis near the right adnexa. Mild mesenteric edema and indeterminate low-density material along the left side of the abdomen on sequence 2 image 35 and just anterior to stomach and image number 35. These areas of low-density could represent trace fluid but difficult to exclude subtle peritoneal neoplastic disease. Musculoskeletal: No suspicious osseous findings. IMPRESSION: 1. Focal abnormality in the pancreatic body/neck region with dilatation of the pancreatic duct distal to this area. Findings raise concern for an underlying neoplastic lesion although focal inflammation from pancreatitis is also in the differential diagnosis. There are changes compatible with pancreatitis including stranding anterior to the pancreas with a fluid collection around the pancreatic body and a massive fluid collection involving the stomach which is causing severe compression of the stomach lumen. These findings  could be secondary to pancreatitis with a massive pseudocyst formation but need to exclude a neoplastic process in the pancreatic neck region. Recommend GI consultation. 2. Small amount of fluid and stranding in the abdomen and pelvis which could be associated with pancreatitis. However, some of these areas of peritoneal low density are indeterminate and peritoneal neoplastic disease can not be excluded depending on the etiology of the pancreatic lesion. 3. Hepatic steatosis with scattered hypodensities in the liver. Some of these hypodensities could represent cysts or even areas of focal fat. Subtle hepatic lesions cannot be excluded and could be better characterized with a liver MRI. 4. Uterine fibroids. These results were called by telephone at the time of interpretation on 07/24/2021 at 10:09 am to provider ADAM CURATOLO , who verbally acknowledged these results. Electronically Signed   By: Markus Daft M.D.   On: 07/24/2021 10:13   DG Chest Portable 1 View  Result Date: 07/24/2021 CLINICAL DATA:  Cough EXAM: PORTABLE CHEST 1 VIEW COMPARISON:  Chest radiograph dated October 04, 2017 FINDINGS: The heart size and mediastinal contours are within normal limits. Both lungs are clear without evidence of focal consolidation or pleural effusion. The visualized skeletal structures are unremarkable. IMPRESSION: No active disease. Electronically Signed   By: Keane Police D.O.   On: 07/24/2021 08:49      Subjective: Patient seen and examined at the bedside this afternoon.  Hemodynamically stable for discharge today.  Discharge Exam: Vitals:   08/06/21 1303 08/06/21 1304  BP:    Pulse:    Resp: 17 17  Temp:    SpO2:     Vitals:   08/06/21 0747 08/06/21 1302 08/06/21 1303 08/06/21 1304  BP: 103/60 101/67    Pulse: (!) 104 (!) 101    Resp: 17 (!) '23 17 17  '$ Temp: 98.6 F (37 C) 99.1 F (37.3 C)    TempSrc: Oral Oral    SpO2: 96% 93%    Weight:      Height:        General: Pt is alert, awake, not  in acute distress Cardiovascular: RRR, S1/S2 +, no rubs,  no gallops Respiratory: CTA bilaterally, no wheezing, no rhonchi Abdominal: Soft, NT, ND, bowel sounds + Extremities: no edema, no cyanosis    The results of significant diagnostics from this hospitalization (including imaging, microbiology, ancillary and laboratory) are listed below for reference.     Microbiology: Recent Results (from the past 240 hour(s))  MRSA Next Gen by PCR, Nasal     Status: None   Collection Time: 07/28/21  2:25 PM   Specimen: Nasal Mucosa; Nasal Swab  Result Value Ref Range Status   MRSA by PCR Next Gen NOT DETECTED NOT DETECTED Final    Comment: (NOTE) The GeneXpert MRSA Assay (FDA approved for NASAL specimens only), is one component of a comprehensive MRSA colonization surveillance program. It is not intended to diagnose MRSA infection nor to guide or monitor treatment for MRSA infections. Test performance is not FDA approved in patients less than 41 years old. Performed at [facility], [place] [address] [place], [place] [address]      Labs: BNP (last 3 results) Recent Labs    07/24/21 1703  BNP 06.3   Basic Metabolic Panel: Recent Labs  Lab 07/31/21 0207 08/01/21 0039 08/02/21 0050 08/04/21 0009 08/05/21 0327 08/06/21 0224  NA 142 139 140 137 137 140  K 3.2* 3.4* 3.5 3.7 4.0 3.3*  CL 105 103 100 95* 101 103  CO2 '28 27 30 29 28 26  '$ GLUCOSE 77 78 104* 104* 100* 103*  BUN 5* <5* <5* <5* 5* 5*  CREATININE 0.50 0.58 0.41* 0.53 0.59 0.41*  CALCIUM 8.1* 7.9* 8.0* 8.8* 8.3* 8.4*  MG 1.9 1.7  --  1.7  --  2.0   Liver Function Tests: Recent Labs  Lab 07/31/21 0207 08/01/21 0039  AST 21 21  ALT 13 12  ALKPHOS 78 64  BILITOT 0.7 0.8  PROT 4.6* 4.2*  ALBUMIN 2.3* 2.1*   No results for input(s): "LIPASE", "AMYLASE" in the last 168 hours. No results for input(s): "AMMONIA" in the last 168 hours. CBC: Recent Labs  Lab 07/31/21 0207 08/01/21 0039 08/05/21 0327  08/06/21 0224  WBC 12.5* 9.4 10.2 8.6  NEUTROABS 9.4* 6.6  --   --   HGB 13.0 12.3 13.1 11.7*  HCT 40.8 38.2 40.0 36.0  MCV 107.9* 107.6* 104.4* 105.3*  PLT 501* 482* 508* 521*   Cardiac Enzymes: No results for input(s): "CKTOTAL", "CKMB", "CKMBINDEX", "TROPONINI" in the last 168 hours. BNP: Invalid input(s): "POCBNP" CBG: Recent Labs  Lab 08/01/21 0835 08/03/21 0632 08/04/21 0613  GLUCAP 81 124* 91   D-Dimer No results for input(s): "DDIMER" in the last 72 hours. Hgb A1c No results for input(s): "HGBA1C" in the last 72 hours. Lipid Profile No results for input(s): "CHOL", "HDL", "LDLCALC", "TRIG", "CHOLHDL", "LDLDIRECT" in the last 72 hours. Thyroid function studies No results for input(s): "TSH", "T4TOTAL", "T3FREE", "THYROIDAB" in the last 72 hours.  Invalid input(s): "FREET3" Anemia work up No results for input(s): "VITAMINB12", "FOLATE", "FERRITIN", "TIBC", "IRON", "RETICCTPCT" in the last 72 hours. Urinalysis    Component Value Date/Time   COLORURINE YELLOW 07/24/2021 0827   APPEARANCEUR HAZY (A) 07/24/2021 0827   LABSPEC 1.019 07/24/2021 0827   PHURINE 5.0 07/24/2021 0827   GLUCOSEU NEGATIVE 07/24/2021 0827   HGBUR NEGATIVE 07/24/2021 0827   BILIRUBINUR NEGATIVE 07/24/2021 0827   KETONESUR NEGATIVE 07/24/2021 0827   PROTEINUR TRACE (A) 07/24/2021 0827   UROBILINOGEN 0.2 01/23/2010 1750   NITRITE NEGATIVE 07/24/2021 0827   LEUKOCYTESUR NEGATIVE 07/24/2021 0827  Sepsis Labs Recent Labs  Lab 07/31/21 0207 08/01/21 0039 08/05/21 0327 08/06/21 0224  WBC 12.5* 9.4 10.2 8.6   Microbiology Recent Results (from the past 240 hour(s))  MRSA Next Gen by PCR, Nasal     Status: None   Collection Time: 07/28/21  2:25 PM   Specimen: Nasal Mucosa; Nasal Swab  Result Value Ref Range Status   MRSA by PCR Next Gen NOT DETECTED NOT DETECTED Final    Comment: (NOTE) The GeneXpert MRSA Assay (FDA approved for NASAL specimens only), is one component of a  comprehensive MRSA colonization surveillance program. It is not intended to diagnose MRSA infection nor to guide or monitor treatment for MRSA infections. Test performance is not FDA approved in patients less than 27 years old. Performed at [facility], [place] [address] [place],  [address]     Please note: You were cared for by a hospitalist during your hospital stay. Once you are discharged, your primary care physician will handle any further medical issues. Please note that NO REFILLS for any discharge medications will be authorized once you are discharged, as it is imperative that you return to your primary care physician (or establish a relationship with a primary care physician if you do not have one) for your post hospital discharge needs so that they can reassess your need for medications and monitor your lab values.    Time coordinating discharge: 40 minutes  SIGNED:   Shelly Coss, MD  [facility] 08/06/2021, 2:24 PM Pager [phone]  If 7PM-7AM, please contact night-coverage [email or web address] Password [email or web address]

## 2021-08-06 NOTE — Plan of Care (Signed)
  Problem: Education: Goal: Knowledge of General Education information will improve Description: Including pain rating scale, medication(s)/side effects and non-pharmacologic comfort measures Outcome: Adequate for Discharge   Problem: Health Behavior/Discharge Planning: Goal: Ability to manage health-related needs will improve Outcome: Adequate for Discharge   Problem: Clinical Measurements: Goal: Ability to maintain clinical measurements within normal limits will improve Outcome: Adequate for Discharge Goal: Will remain free from infection Outcome: Adequate for Discharge Goal: Diagnostic test results will improve Outcome: Adequate for Discharge Goal: Respiratory complications will improve Outcome: Adequate for Discharge Goal: Cardiovascular complication will be avoided Outcome: Adequate for Discharge   Problem: Activity: Goal: Risk for activity intolerance will decrease Outcome: Adequate for Discharge   Problem: Nutrition: Goal: Adequate nutrition will be maintained Outcome: Adequate for Discharge   Problem: Coping: Goal: Level of anxiety will decrease Outcome: Adequate for Discharge   Problem: Elimination: Goal: Will not experience complications related to bowel motility Outcome: Adequate for Discharge Goal: Will not experience complications related to urinary retention Outcome: Adequate for Discharge   Problem: Pain Managment: Goal: General experience of comfort will improve Outcome: Adequate for Discharge   Problem: Safety: Goal: Ability to remain free from injury will improve Outcome: Adequate for Discharge   Problem: Skin Integrity: Goal: Risk for impaired skin integrity will decrease Outcome: Adequate for Discharge   Problem: Safety: Goal: Violent Restraint(s) Outcome: Adequate for Discharge   Problem: Safety: Goal: Non-violent Restraint(s) Outcome: Adequate for Discharge

## 2021-08-06 NOTE — Progress Notes (Signed)
Physical Therapy Treatment Patient Details Name: Kathryn Underwood MRN: [id number] DOB: [birth year] Today's Date: 08/06/2021   History of Present Illness 61 y/o female presented to ED on 07/24/21 for constipation/abdomen pain/black diarrhea. 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 Underwent ETOH withdrawal while admitted. S/p peri-pancreatic fluid drainage on 7/6. PMH: alcohol dependence, COPD, hx of herpes    PT Comments    Pt received supine and agreeable to session with continued progress with gait and balance. Pt able to demonstrate ambulation without AD and without need for HHA throughout and accept mild balance challenges with head turns R/L and up/down without LOB, and head turns on verbal command in all planes with mild LOB with pt able to self correct in all instances with stepping strategies. Pt continues to benefit from skilled PT services to progress toward functional mobility goals.   HR at rest: 101 HR during ambulation: 117 max HR at end of session: 105   Recommendations for follow up therapy are one component of a multi-disciplinary discharge planning process, led by the attending physician.  Recommendations may be updated based on patient status, additional functional criteria and insurance authorization.  Follow Up Recommendations  Home health PT     Assistance Recommended at Discharge Intermittent Supervision/Assistance  Patient can return home with the following A little help with bathing/dressing/bathroom;Help with stairs or ramp for entrance;Assistance with cooking/housework   Equipment Recommendations  None recommended by PT    Recommendations for Other Services       Precautions / Restrictions Precautions Precautions: Fall Precaution Comments: watch HR Restrictions Weight Bearing Restrictions: No     Mobility  Bed Mobility Overal bed mobility: Modified Independent Bed Mobility: Supine to Sit     Supine to sit: Supervision      General bed mobility comments: supervision for safety    Transfers Overall transfer level: Needs assistance Equipment used: None Transfers: Sit to/from Stand, Bed to chair/wheelchair/BSC Sit to Stand: Supervision   Step pivot transfers: Supervision       General transfer comment: supervision for safety from EOB<>BSC and to come to standing    Ambulation/Gait Ambulation/Gait assistance: Min guard Gait Distance (Feet): 250 Feet Assistive device: None Gait Pattern/deviations: Step-through pattern, Decreased stride length Gait velocity: decreased     General Gait Details: Assist for safety. pt with guarded gait, able to accept mild balance challenges with head turns R/L, up/down, and head turns on verbal command with mild LOB with pt able to self correct in all instances   Stairs             Wheelchair Mobility    Modified Rankin (Stroke Patients Only)       Balance Overall balance assessment: Needs assistance Sitting-balance support: Feet supported Sitting balance-Leahy Scale: Good     Standing balance support: No upper extremity supported, During functional activity Standing balance-Leahy Scale: Fair                              Cognition Arousal/Alertness: Awake/alert Behavior During Therapy: WFL for tasks assessed/performed Overall Cognitive Status: Within Functional Limits for tasks assessed                                 General Comments: following commands well. Appropriate throughout session        Exercises      General Comments General comments (skin integrity,  edema, etc.): HR 101bpm on entry, up to 117bpm max during ambulation      Pertinent Vitals/Pain Pain Assessment Pain Assessment: No/denies pain    Home Living                          Prior Function            PT Goals (current goals can now be found in the care plan section) Acute Rehab PT Goals Patient Stated Goal: to get  stronger PT Goal Formulation: With patient Time For Goal Achievement: 08/16/21    Frequency    Min 3X/week      PT Plan Current plan remains appropriate    Co-evaluation              AM-PAC PT "6 Clicks" Mobility   Outcome Measure  Help needed turning from your back to your side while in a flat bed without using bedrails?: None Help needed moving from lying on your back to sitting on the side of a flat bed without using bedrails?: None Help needed moving to and from a bed to a chair (including a wheelchair)?: A Little Help needed standing up from a chair using your arms (e.g., wheelchair or bedside chair)?: A Little Help needed to walk in hospital room?: A Little Help needed climbing 3-5 steps with a railing? : A Little 6 Click Score: 20    End of Session Equipment Utilized During Treatment: Gait belt Activity Tolerance: Patient tolerated treatment well Patient left: with call bell/phone within reach;in bed (seated EOB) Nurse Communication: Mobility status PT Visit Diagnosis: Unsteadiness on feet (R26.81);Muscle weakness (generalized) (M62.81)     Time: 2248-2500 PT Time Calculation (min) (ACUTE ONLY): 15 min  Charges:  $Gait Training: 8-22 mins                     Cambrey Lupi R. PTA Acute Rehabilitation Services Office: [place] 08/06/2021, 9:30 AM

## 2021-08-06 NOTE — Progress Notes (Signed)
Small tick found on pts RUE. No irritation or rash present. MD notified. No new orders.  Raelyn Number, RN

## 2021-08-07 ENCOUNTER — Other Ambulatory Visit (HOSPITAL_COMMUNITY): Payer: Self-pay

## 2021-08-07 ENCOUNTER — Other Ambulatory Visit: Payer: Self-pay | Admitting: Physician Assistant

## 2021-08-07 ENCOUNTER — Telehealth: Payer: Self-pay | Admitting: Physician Assistant

## 2021-08-07 ENCOUNTER — Encounter (HOSPITAL_COMMUNITY): Payer: Self-pay | Admitting: Gastroenterology

## 2021-08-07 MED ORDER — ESZOPICLONE 3 MG PO TABS
3.0000 mg | ORAL_TABLET | Freq: Every evening | ORAL | 0 refills | Status: DC | PRN
Start: 2021-08-07 — End: 2021-09-01

## 2021-08-07 NOTE — Telephone Encounter (Signed)
Pt is scheduled for HOSP FU on 08/12/21  Pt states 07/12 AVS from recent hospital admission shows the following medication as one she is currently taking. -- States she does not have this medicine.   Eszopiclone 3 MG Tabs Take 3 mg by mouth at bedtime as needed for sleep. Take immediately before bedtime   Pt does not have the medication or script.  Pt requests PCP Team to advise on the taking of the prescription with a call back.

## 2021-08-07 NOTE — Progress Notes (Signed)
Attempted to obtain medical history via telephone, unable to reach at this time. HIPAA compliant voicemail message left requesting return call to pre surgical testing department. 

## 2021-08-07 NOTE — Telephone Encounter (Signed)
Please advise; looks as if this was prescribed at hospital

## 2021-08-08 NOTE — Telephone Encounter (Signed)
Pt advised and confirmed she will be in the office Tuesday; pt verbalized understanding

## 2021-08-12 ENCOUNTER — Encounter: Payer: Self-pay | Admitting: Physician Assistant

## 2021-08-12 ENCOUNTER — Telehealth: Payer: Self-pay | Admitting: Physician Assistant

## 2021-08-12 ENCOUNTER — Ambulatory Visit (INDEPENDENT_AMBULATORY_CARE_PROVIDER_SITE_OTHER): Payer: Self-pay | Admitting: Physician Assistant

## 2021-08-12 VITALS — BP 120/72 | HR 114 | Temp 98.2°F | Resp 17 | Ht 63.0 in | Wt 149.6 lb

## 2021-08-12 DIAGNOSIS — D649 Anemia, unspecified: Secondary | ICD-10-CM

## 2021-08-12 DIAGNOSIS — R5381 Other malaise: Secondary | ICD-10-CM

## 2021-08-12 DIAGNOSIS — K863 Pseudocyst of pancreas: Secondary | ICD-10-CM

## 2021-08-12 DIAGNOSIS — R000 Tachycardia, unspecified: Secondary | ICD-10-CM

## 2021-08-12 DIAGNOSIS — K852 Alcohol induced acute pancreatitis without necrosis or infection: Secondary | ICD-10-CM

## 2021-08-12 DIAGNOSIS — R600 Localized edema: Secondary | ICD-10-CM

## 2021-08-12 LAB — COMPREHENSIVE METABOLIC PANEL
ALT: 23 U/L (ref 0–35)
AST: 25 U/L (ref 0–37)
Albumin: 3.2 g/dL — ABNORMAL LOW (ref 3.5–5.2)
Alkaline Phosphatase: 105 U/L (ref 39–117)
BUN: 6 mg/dL (ref 6–23)
CO2: 33 mEq/L — ABNORMAL HIGH (ref 19–32)
Calcium: 9.1 mg/dL (ref 8.4–10.5)
Chloride: 98 mEq/L (ref 96–112)
Creatinine, Ser: 0.46 mg/dL (ref 0.40–1.20)
GFR: 102.59 mL/min (ref >60.00)
Glucose, Bld: 104 mg/dL — ABNORMAL HIGH (ref 70–99)
Potassium: 3.8 mEq/L (ref 3.5–5.1)
Sodium: 139 mEq/L (ref 135–145)
Total Bilirubin: 0.2 mg/dL (ref 0.2–1.2)
Total Protein: 5.7 g/dL — ABNORMAL LOW (ref 6.0–8.3)

## 2021-08-12 LAB — CBC WITH DIFFERENTIAL/PLATELET
Basophils Absolute: 0.1 10*3/uL (ref 0.0–0.1)
Basophils Relative: 1.1 % (ref 0.0–3.0)
Eosinophils Absolute: 0.2 10*3/uL (ref 0.0–0.7)
Eosinophils Relative: 2.1 % (ref 0.0–5.0)
HCT: 36.6 % (ref 36.0–46.0)
Hemoglobin: 12.2 g/dL (ref 12.0–15.0)
Lymphocytes Relative: 15.7 % (ref 12.0–46.0)
Lymphs Abs: 1.6 10*3/uL (ref 0.7–4.0)
MCHC: 33.3 g/dL (ref 30.0–36.0)
MCV: 106.1 fl — ABNORMAL HIGH (ref 78.0–100.0)
Monocytes Absolute: 0.9 10*3/uL (ref 0.1–1.0)
Monocytes Relative: 8.4 % (ref 3.0–12.0)
Neutro Abs: 7.5 10*3/uL (ref 1.4–7.7)
Neutrophils Relative %: 72.7 % (ref 43.0–77.0)
Platelets: 613 10*3/uL — ABNORMAL HIGH (ref 150.0–400.0)
RBC: 3.45 Mil/uL — ABNORMAL LOW (ref 3.87–5.11)
RDW: 14.9 % (ref 11.5–15.5)
WBC: 10.3 10*3/uL (ref 4.0–10.5)

## 2021-08-12 LAB — BRAIN NATRIURETIC PEPTIDE: Pro B Natriuretic peptide (BNP): 87 pg/mL (ref 0.0–100.0)

## 2021-08-12 NOTE — Progress Notes (Signed)
Chief Complaint:  Kathryn Underwood is a 61 y.o. female who presents today for a TCM visit. Here with daughter, Anderson Malta.   Subjective:  HPI:  Summary of Hospital admission: Reason for admission: Alcohol induced pancreatitis; pancreatic cyst Date of admission: 07/24/21, [facility] Date of discharge: 08/06/21 Date of Interactive contact: Today with me (patient does not have insurance) Summary of Hospital course: Patient presented initially in my office on 07/23/2021 as a new patient appointment.  She was found to have significant abdominal pain complaints and protuberant abdomen.  I sent her to the emergency department, where she was found to have acute pancreatitis and a pancreatic cyst.  IR has been consulted and she underwent drainage of the cyst.  Patient had consult with gastroenterology.  She also had consult with cardiology while she was in the hospital for MAT/sinus tachycardia, hypotension.  She has a history of alcohol dependence, depression, anxiety.  Eventually she was hemodynamically stable for discharge.  Interim history:  -Using a walker, not having much stamina; states house is all on one floor, two steps going up into house -Some SOB, no change from while in hospital; no CP at all  -Two days after discharge, started noticing RLE edema (about 4 days ago); no pain, no redness -Thursday - endoscopy at [facility] -Denies any pain at all today -No alcohol, no smoking since 07/23/21 -Desiring support moving forward on these things, but does not want AA.  Does not have a Social worker.    ROS: Fatigue, bilateral lower extremity edema, otherwise a complete review of systems was negative.   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   Uterine fibroid 07/27/2021   COPD (chronic obstructive pulmonary disease) ([place]) 07/27/2021   Insomnia 07/27/2021   Pulmonary nodule 07/25/2021   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/25/2021   Alcohol induced acute pancreatitis without necrosis or infection 33/29/5188   Alcoholic ketoacidosis 41/66/0630   Epigastric pain 10/04/2017   MDD (major depressive disorder), recurrent severe, 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   Alcohol withdrawal ([place]) 04/25/2016   Sinus tachycardia 04/25/2016   Transaminitis 04/25/2016   Leukocytosis 04/25/2016   Closed nondisplaced fracture of fifth right metatarsal bone 04/08/2016   Acute pain of right shoulder 04/08/2016   Vitamin D deficiency 12/11/2015   Alcohol dependence with unspecified alcohol-induced disorder ([place]) 12/09/2015   Fatigue 12/09/2015   Encounter for alcohol abuse counseling and surveillance 12/09/2015   Cough 01/04/2015   Tobacco abuse 01/04/2015   Depression with anxiety 01/04/2015   Past Surgical History:  Procedure Laterality Date   CESAREAN SECTION     ESOPHAGOGASTRODUODENOSCOPY N/A 08/14/2021   Procedure: ESOPHAGOGASTRODUODENOSCOPY (EGD);  Surgeon: Milus Banister, MD;  Location: [facility];  Service: Gastroenterology;  Laterality: N/A;   EUS N/A 08/14/2021   Procedure: UPPER ENDOSCOPIC ULTRASOUND (EUS) RADIAL;  Surgeon: Milus Banister, MD;  Location: [facility]COPY;  Service: Gastroenterology;  Laterality: N/A;   FINE NEEDLE ASPIRATION N/A 08/14/2021   Procedure: FINE NEEDLE ASPIRATION (FNA) LINEAR;  Surgeon: Milus Banister, MD;  Location: [facility] ENDOSCOPY;  Service: Gastroenterology;  Laterality: N/A;  TONSILLECTOMY      Family History  Problem Relation Age of Onset   Dementia Mother    Arthritis Father    Asthma Father    Asthma Brother    Diabetes Maternal Grandmother    Dementia Maternal Grandmother    Diabetes  Paternal Grandmother    Parkinson's disease Maternal Grandfather    Parkinson's disease Paternal Grandfather    Breast cancer Neg Hx    Colon cancer Neg Hx     Medications- Reconciled discharge and current medications in Epic.  Current Outpatient Medications  Medication Sig Dispense Refill   albuterol (VENTOLIN HFA) 108 (90 Base) MCG/ACT inhaler Inhale 2 puffs into the lungs every 6 (six) hours as needed for wheezing or shortness of breath. 8.5 g 1   amiodarone (PACERONE) 200 MG tablet Take 1 tablet (200 mg total) by mouth 2 (two) times daily for 6 days. 12 tablet 0   amiodarone (PACERONE) 200 MG tablet Take 1 tablet (200 mg total) by mouth daily. 30 tablet 1   Eszopiclone 3 MG TABS Take 1 tablet (3 mg total) by mouth at bedtime as needed. Take immediately before bedtimeTake 3 mg by mouth at bedtime as needed for sleep. Take immediately before bedtime 30 tablet 0   folic acid (FOLVITE) 1 MG tablet Take 1 tablet (1 mg total) by mouth daily. 30 tablet 1   gabapentin (NEURONTIN) 300 MG capsule Take 2 capsules (600 mg total) by mouth 2 (two) times daily. 60 capsule 0   mirtazapine (REMERON) 15 MG tablet Take 1 tablet (15 mg total) by mouth at bedtime. 30 tablet 1   mometasone-formoterol (DULERA) 200-5 MCG/ACT AERO Inhale 2 puffs into the lungs 2 (two) times daily. 13 g 1   QUEtiapine (SEROQUEL) 25 MG tablet Take 1 tablet (25 mg total) by mouth at bedtime. 30 tablet 1   thiamine 100 MG tablet Take 1 tablet (100 mg total) by mouth daily. 30 tablet 1   ciprofloxacin (CIPRO) 500 MG tablet Take 1 tablet (500 mg total) by mouth 2 (two) times daily. 6 tablet 0   No current facility-administered medications for this visit.    Allergies-reviewed and updated No Known Allergies  Social History   Socioeconomic History   Marital status: Divorced    Spouse name: Not on file   Number of children: Not on file   Years of education: Not on file   Highest education level: Not on file  Occupational  History   Not on file  Tobacco Use   Smoking status: Former    Packs/day: 0.50    Types: Cigarettes   Smokeless tobacco: Never  Substance and Sexual Activity   Alcohol use: Yes    Alcohol/week: 16.0 standard drinks of alcohol    Types: 16 Glasses of wine per week    Comment: daily (1.5 liter bottle wine every 12)   Drug use: Yes    Types: Cocaine, Marijuana   Sexual activity: Yes    Partners: Male    Comment: female partner  Other Topics Concern   Not on file  Social History Narrative   Divorced. Homemaker. HS grad.    Lives with life partner (stan)   Drinks caffeine.   Wears seatbelt. Smoke dectector in the home. No firearms in the home.    Feels safe in her relationships.     Social Determinants of Health   Financial Resource Strain: Not on file  Food Insecurity: Not on file  Transportation Needs: Not on file  Physical Activity: Not on file  Stress: Not on file  Social Connections: Not on file        Objective:  Physical Exam: BP 120/72   Pulse (!) 114   Temp 98.2 F (36.8 C) (Temporal)   Resp 17   Ht '5\' 3"'$  (1.6 m)   Wt 149 lb 9.6 oz (67.9 kg)   SpO2 92%   BMI 26.50 kg/m   Gen: NAD, resting comfortably CV: Tachycardia with no murmurs appreciated Pulm: NWOB, CTAB with no crackles, wheezes, or rhonchi GI: Normal bowel sounds present. Soft, Nontender, protuberant, but improved MSK: Bilateral lower extremity edema, R > L slightly, but no redness, Homan's sign neg, no tenderness, cyanosis, or clubbing noted Skin: Warm, dry Neuro: Grossly normal, moves all extremities Psych: Normal affect and thought content  Assessment/Plan:  Problems Addressed Today: Alcohol induced acute pancreatitis without necrosis or infection - Plan: CBC with Differential/Platelet, Comprehensive metabolic panel  Pancreatic pseudocyst - Plan: CBC with Differential/Platelet, Comprehensive metabolic panel  Sinus tachycardia - Plan: CBC with Differential/Platelet, Comprehensive metabolic  panel  Physical deconditioning - Plan: CBC with Differential/Platelet, Comprehensive metabolic panel  Bilateral leg edema - Plan: CBC with Differential/Platelet, Comprehensive metabolic panel, B Nat Peptide  Low hemoglobin - Plan: CBC with Differential/Platelet, Iron, TIBC and Ferritin Panel  -Plan to repeat labs as listed -Reassured no evidence of DVT on exam; informed of red flags to watch for -Plan for low salt diet, compression socks, elevation  -She needs to keep f/up with GI and cardiology  -Recommend PT for reconditioning -Also strongly recommend counseling for support with alcohol misuse, anxiety, depression; she declines at this time    Patient has a high level of medical decision making.     Summary/Review of work up during hospitalization: MR abdomen - pancreatic pseudocysts, pancreatic lesion, benign hepatic cysts, bilateral pleural effusions; CXR - Sm R pleural effusion; labs including CBC, BMP, BNP; fine aspiration pancreatic fluid   Time Spent: 42 minutes of total time was spent on the date of the encounter performing the following actions: chart review prior to seeing the patient, obtaining history, performing a medically necessary exam, counseling on the treatment plan, placing orders, and documenting in our EHR.       Nalaya Wojdyla, PA-C

## 2021-08-12 NOTE — Telephone Encounter (Signed)
FYI--Patient called stating that she was still experiencing leg swelling as well as abdominal swelling following her visit with Alyssa Allwardt today. While she had no pain and has been elevating her feet, she wanted to know if she needed to do anything further. I was able to talk with Joellen Jersey who acted as Oncologist today and she suggested patient come in on 07/19 if she felt this was necessary. Joellen Jersey also recommended that if symptoms worsen or pain presents, to go to the UC or ED. I voiced these recommendations to the patient and she politely refused, stating she thinks she was having some anxiety over her new norm at the time. She will callback or visit the UC/ED if symptoms worsen.

## 2021-08-12 NOTE — Patient Instructions (Signed)
Good to see you again today.  Please have labs done today. Watch leg swelling - call if worsening swelling, shortness of breath, any pain or redness develops. Try compression stockings and elevate above heart level.   Keep follow-ups as scheduled with GI and cardiology.  Consider PT and counseling at our office.

## 2021-08-13 ENCOUNTER — Telehealth: Payer: Self-pay | Admitting: Cardiology

## 2021-08-13 DIAGNOSIS — R600 Localized edema: Secondary | ICD-10-CM

## 2021-08-13 LAB — IRON,TIBC AND FERRITIN PANEL
%SAT: 22 % (calc) (ref 16–45)
Ferritin: 86 ng/mL (ref 16–288)
Iron: 51 ug/dL (ref 45–160)
TIBC: 233 mcg/dL (calc) — ABNORMAL LOW (ref 250–450)

## 2021-08-13 NOTE — Telephone Encounter (Signed)
FYI

## 2021-08-13 NOTE — Telephone Encounter (Signed)
Patient called to inquire about her prep instructions that she has not yet received. Requested to speak with a nurse.

## 2021-08-13 NOTE — Telephone Encounter (Signed)
Patient says she is having swelling in her legs, thinks it could be a side effect of taking amiodarone, but she is not sure. She saw one of our doctors at the hospital  who discussed this possibility with her. Please call her back to discuss further.

## 2021-08-13 NOTE — Telephone Encounter (Signed)
I spoke with the pt over the phone and we discussed her instructions for the procedure tomorrow.  She is aware to have nothing to eat or drink after midnight.  All questions answered to the best of my ability.

## 2021-08-14 ENCOUNTER — Encounter (HOSPITAL_COMMUNITY): Payer: Self-pay | Admitting: Gastroenterology

## 2021-08-14 ENCOUNTER — Ambulatory Visit (HOSPITAL_COMMUNITY)
Admission: RE | Admit: 2021-08-14 | Discharge: 2021-08-14 | Disposition: A | Discharge status: Home / Self Care | Payer: Self-pay | Attending: Gastroenterology | Admitting: Gastroenterology

## 2021-08-14 ENCOUNTER — Encounter (HOSPITAL_COMMUNITY)
Admission: RE | Disposition: A | Discharge status: Home / Self Care | Payer: Self-pay | Source: Home / Self Care | Attending: Gastroenterology

## 2021-08-14 ENCOUNTER — Ambulatory Visit (HOSPITAL_COMMUNITY): Payer: Self-pay | Admitting: Certified Registered"

## 2021-08-14 ENCOUNTER — Other Ambulatory Visit: Payer: Self-pay

## 2021-08-14 ENCOUNTER — Ambulatory Visit (HOSPITAL_BASED_OUTPATIENT_CLINIC_OR_DEPARTMENT_OTHER): Payer: Self-pay | Admitting: Certified Registered"

## 2021-08-14 DIAGNOSIS — F32A Depression, unspecified: Secondary | ICD-10-CM | POA: Insufficient documentation

## 2021-08-14 DIAGNOSIS — K863 Pseudocyst of pancreas: Secondary | ICD-10-CM

## 2021-08-14 DIAGNOSIS — F418 Other specified anxiety disorders: Secondary | ICD-10-CM

## 2021-08-14 DIAGNOSIS — J449 Chronic obstructive pulmonary disease, unspecified: Secondary | ICD-10-CM | POA: Insufficient documentation

## 2021-08-14 DIAGNOSIS — F419 Anxiety disorder, unspecified: Secondary | ICD-10-CM | POA: Insufficient documentation

## 2021-08-14 DIAGNOSIS — K862 Cyst of pancreas: Secondary | ICD-10-CM | POA: Insufficient documentation

## 2021-08-14 DIAGNOSIS — Z87891 Personal history of nicotine dependence: Secondary | ICD-10-CM | POA: Insufficient documentation

## 2021-08-14 DIAGNOSIS — K852 Alcohol induced acute pancreatitis without necrosis or infection: Secondary | ICD-10-CM

## 2021-08-14 HISTORY — PX: FINE NEEDLE ASPIRATION: SHX5430

## 2021-08-14 HISTORY — PX: ESOPHAGOGASTRODUODENOSCOPY: SHX5428

## 2021-08-14 HISTORY — PX: EUS: SHX5427

## 2021-08-14 SURGERY — EGD (ESOPHAGOGASTRODUODENOSCOPY)
Anesthesia: Monitor Anesthesia Care

## 2021-08-14 MED ORDER — PROPOFOL 500 MG/50ML IV EMUL
INTRAVENOUS | Status: DC | PRN
  Administered 2021-08-14: 130 ug/kg/min via INTRAVENOUS
  Administered 2021-08-14: 20 mg via INTRAVENOUS

## 2021-08-14 MED ORDER — PROPOFOL 1000 MG/100ML IV EMUL
INTRAVENOUS | Status: AC
  Filled 2021-08-14: qty 100

## 2021-08-14 MED ORDER — CIPROFLOXACIN HCL 500 MG PO TABS: 500.0000 mg | ORAL_TABLET | Freq: Two times a day (BID) | ORAL | 0 refills | Status: DC

## 2021-08-14 MED ORDER — SODIUM CHLORIDE 0.9 % IV SOLN: INTRAVENOUS | Status: DC

## 2021-08-14 MED ORDER — LACTATED RINGERS IV SOLN: INTRAVENOUS | Status: DC

## 2021-08-14 MED ORDER — DEXMEDETOMIDINE HCL IN NACL 80 MCG/20ML IV SOLN
INTRAVENOUS | Status: AC
  Filled 2021-08-14: qty 20

## 2021-08-14 MED ORDER — LIDOCAINE 2% (20 MG/ML) 5 ML SYRINGE
INTRAMUSCULAR | Status: DC | PRN
  Administered 2021-08-14: 60 mg via INTRAVENOUS

## 2021-08-14 MED ORDER — CIPROFLOXACIN IN D5W 400 MG/200ML IV SOLN
INTRAVENOUS | Status: DC | PRN
  Administered 2021-08-14: 400 mg via INTRAVENOUS

## 2021-08-14 MED ORDER — PROPOFOL 500 MG/50ML IV EMUL
INTRAVENOUS | Status: AC
  Filled 2021-08-14: qty 50

## 2021-08-14 MED ORDER — LACTATED RINGERS IV SOLN
INTRAVENOUS | Status: AC | PRN
  Administered 2021-08-14: 1000 mL via INTRAVENOUS

## 2021-08-14 MED ORDER — CIPROFLOXACIN IN D5W 400 MG/200ML IV SOLN
INTRAVENOUS | Status: AC
  Filled 2021-08-14: qty 200

## 2021-08-14 NOTE — Discharge Instructions (Signed)
YOU HAD AN ENDOSCOPIC PROCEDURE TODAY: Refer to the procedure report and other information in the discharge instructions given to you for any specific questions about what was found during the examination. If this information does not answer your questions, please call [place] office at [phone] to clarify.  ° °YOU SHOULD EXPECT: Some feelings of bloating in the abdomen. Passage of more gas than usual. Walking can help get rid of the air that was put into your GI tract during the procedure and reduce the bloating. If you had a lower endoscopy (such as a colonoscopy or flexible sigmoidoscopy) you may notice spotting of blood in your stool or on the toilet paper. Some abdominal soreness may be present for a day or two, also. ° °DIET: Your first meal following the procedure should be a light meal and then it is ok to progress to your normal diet. A half-sandwich or bowl of soup is an example of a good first meal. Heavy or fried foods are harder to digest and may make you feel nauseous or bloated. Drink plenty of fluids but you should avoid alcoholic beverages for 24 hours. If you had a esophageal dilation, please see attached instructions for diet.   ° °ACTIVITY: Your care partner should take you home directly after the procedure. You should plan to take it easy, moving slowly for the rest of the day. You can resume normal activity the day after the procedure however YOU SHOULD NOT DRIVE, use power tools, machinery or perform tasks that involve climbing or major physical exertion for 24 hours (because of the sedation medicines used during the test).  ° °SYMPTOMS TO REPORT IMMEDIATELY: °A gastroenterologist can be reached at any hour. Please call [phone]  for any of the following symptoms:  °Following lower endoscopy (colonoscopy, flexible sigmoidoscopy) °Excessive amounts of blood in the stool  °Significant tenderness, worsening of abdominal pains  °Swelling of the abdomen that is new, acute  °Fever of 100° or  higher  °Following upper endoscopy (EGD, EUS, ERCP, esophageal dilation) °Vomiting of blood or coffee ground material  °New, significant abdominal pain  °New, significant chest pain or pain under the shoulder blades  °Painful or persistently difficult swallowing  °New shortness of breath  °Black, tarry-looking or red, bloody stools ° °FOLLOW UP:  °If any biopsies were taken you will be contacted by phone or by letter within the next 1-3 weeks. Call [phone]  if you have not heard about the biopsies in 3 weeks.  °Please also call with any specific questions about appointments or follow up tests. ° °

## 2021-08-14 NOTE — Transfer of Care (Signed)
Immediate Anesthesia Transfer of Care Note  Patient: Kathryn Underwood  Procedure(s) Performed: UPPER ENDOSCOPIC ULTRASOUND (EUS) RADIAL ESOPHAGOGASTRODUODENOSCOPY (EGD) FINE NEEDLE ASPIRATION (FNA) LINEAR  Patient Location: PACU and Endoscopy Unit  Anesthesia Type:MAC  Level of Consciousness: awake, alert , oriented and patient cooperative  Airway & Oxygen Therapy: Patient Spontanous Breathing and Patient connected to face mask oxygen  Post-op Assessment: Report given to RN and Post -op Vital signs reviewed and stable  Post vital signs: Reviewed and stable  Last Vitals:  Vitals Value Taken Time  BP 117/77 08/14/21 0824  Temp 35.8 C 08/14/21 0824  Pulse 102 08/14/21 0824  Resp 20 08/14/21 0824  SpO2 100 % 08/14/21 0824  Vitals shown include unvalidated device data.  Last Pain:  Vitals:   08/14/21 0824  TempSrc: Temporal  PainSc: 0-No pain         Complications: No notable events documented.

## 2021-08-14 NOTE — Interval H&P Note (Signed)
History and Physical Interval Note:  08/14/2021 6:55 AM  Kathryn Underwood  has presented today for surgery, with the diagnosis of abnormal pancreas- pancreatits.  The various methods of treatment have been discussed with the patient and family. After consideration of risks, benefits and other options for treatment, the patient has consented to  Procedure(s): UPPER ENDOSCOPIC ULTRASOUND (EUS) RADIAL (N/A) as a surgical intervention.  The patient's history has been reviewed, patient examined, no change in status, stable for surgery.  I have reviewed the patient's chart and labs.  Questions were answered to the patient's satisfaction.     Milus Banister

## 2021-08-14 NOTE — Anesthesia Postprocedure Evaluation (Signed)
Anesthesia Post Note  Patient: LAVAUGHN BISIG  Procedure(s) Performed: UPPER ENDOSCOPIC ULTRASOUND (EUS) RADIAL ESOPHAGOGASTRODUODENOSCOPY (EGD) FINE NEEDLE ASPIRATION (FNA) LINEAR     Patient location during evaluation: PACU Anesthesia Type: MAC Level of consciousness: awake and alert Pain management: pain level controlled Vital Signs Assessment: post-procedure vital signs reviewed and stable Respiratory status: spontaneous breathing, nonlabored ventilation, respiratory function stable and patient connected to nasal cannula oxygen Cardiovascular status: stable and blood pressure returned to baseline Postop Assessment: no apparent nausea or vomiting Anesthetic complications: no   No notable events documented.  Last Vitals:  Vitals:   08/14/21 0830 08/14/21 0840  BP: 123/77 114/75  Pulse: (!) 103 100  Resp: (!) 23 (!) 23  Temp:    SpO2: 98% 96%    Last Pain:  Vitals:   08/14/21 0840  TempSrc:   PainSc: 0-No pain                 Effie Berkshire

## 2021-08-14 NOTE — Op Note (Signed)
[facility] Patient Name: Kathryn Underwood Procedure Date: 08/14/2021 MRN: [id number] Attending MD: Milus Banister , MD Date of Birth: 04-26-1960 CSN: [id number] Age: 61 Admit Type: Outpatient Procedure:                Upper EUS Indications:              Recent hospitalization for mild acute pancreatitis                            in the setting of alcoholism, no stones in                            gallbladder on remote ultrasound or recent MRI.                            Large peripancreatic fluid collection which was                            sampled by interventional radiology, 1.2 L of dark                            fluid was drained and cytology was negative for                            neoplastic cells. Repeat imaging suggested possible                            mass in pancreas. No family history of pancreatic                            cancer. No weight loss recently. Last alcoholic                            beverage about 1 month ago. Providers:                Milus Banister, MD, Kary Kos RN, RN, Cherylynn Ridges, Technician Referring MD:             Scarlette Shorts, MD Medicines:                Monitored Anesthesia Care, Cipro 921 mg IV Complications:            No immediate complications. Estimated blood loss:                            None. Estimated Blood Loss:     Estimated blood loss: none. Procedure:                Pre-Anesthesia Assessment:                           - Prior to the procedure, a History and Physical  was performed, and patient medications and                            allergies were reviewed. The patient's tolerance of                            previous anesthesia was also reviewed. The risks                            and benefits of the procedure and the sedation                            options and risks were discussed with the patient.                            All  questions were answered, and informed consent                            was obtained. Prior Anticoagulants: The patient has                            taken no previous anticoagulant or antiplatelet                            agents. ASA Grade Assessment: II - A patient with                            mild systemic disease. After reviewing the risks                            and benefits, the patient was deemed in                            satisfactory condition to undergo the procedure.                           After obtaining informed consent, the endoscope was                            passed under direct vision. Throughout the                            procedure, the patient's blood pressure, pulse, and                            oxygen saturations were monitored continuously. The                            [id number] Olympus linear ultrasound scope                            was introduced through the mouth, and advanced to  the second part of duodenum. The GF-[id number]-AL5                            ([id number]) Olympus radial ultrasound scope was                            introduced through the mouth, and advanced to the                            second part of duodenum. The upper EUS was                            accomplished without difficulty. The patient                            tolerated the procedure well. Scope In: Scope Out: Findings:      ENDOSCOPIC FINDING: :      The examined esophagus was endoscopically normal.      The entire examined stomach was endoscopically normal.      The examined duodenum was endoscopically normal.      ENDOSONOGRAPHIC FINDING: :      1. Medium sized anechoic fluid collection abutting the pancreas, this       was long and thin and measured 6 cm in length. There was some very thin       septae within the fluid collection and there appeared to be a thick       capsule surrounding the collection. Following  complete endoscopic       ultrasound evaluation I completely aspirated the fluid using a       transgastric approach with 19-gauge EUS FNA needle. This resulted in       about 100 cc of thin brown fluid which I sent for cytology, CEA and       amylase level.      2. There was also a small, layering, heterogeneous fluid collection       surrounding the body of the pancreas which was not encapsulated. I did       not sample this fluid collection.      3. The pancreatic parenchyma was normal without obvious mass lesion or       signs of chronic pancreatitis. The main pancreatic duct was dilated       beginning at about the body of the pancreas up to 4 to 5 mm. I could not       detect any change or discrete lesions at the level of the pancreatic       ductal caliber change.      4. Common bile duct was normal, nondilated and without stones.      5. Limited views of the gallbladder, left lobe of the liver, spleen,       portal and splenic vessels were all normal. Impression:               - Seems most likely that the peripancreatic fluid                            collections are inflammatory and likely related to  recurrent alcoholic pancreatitis. I aspirated one                            of the fluid collections, apparently completely,                            and the fluid resembled the 1.2 L aspirated by                            interventional radiology 3 or 4 weeks ago while she                            was hospitalized (thin, brown fluid). I sent this                            for cytology, CEA and amylase levels.                           - No discrete mass lesions in the liver even at the                            site of pancreatic duct caliber change in the body                            of the pancreas.                           - She will complete 3 days of twice daily Cipro.                           - Await cyst fluid test analysis.                            - Follow-up as outpatient with Dr. Scarlette Shorts in                            our office.                           - Further recommendations pending fluid test                            results. Moderate Sedation:      Not Applicable - Patient had care per Anesthesia. Recommendation:           - Discharge patient to home. Procedure Code(s):        --- Professional ---                           [phone], Esophagogastroduodenoscopy, flexible,                            transoral; with transendoscopic ultrasound-guided  intramural or transmural fine needle                            aspiration/biopsy(s), (includes endoscopic                            ultrasound examination limited to the esophagus,                            stomach or duodenum, and adjacent structures) Diagnosis Code(s):        --- Professional ---                           G26.9, Cyst of pancreas CPT copyright 2019 [employer]. All rights reserved. The codes documented in this report are preliminary and upon coder review may  be revised to meet current compliance requirements. Milus Banister, MD 08/14/2021 8:27:41 AM This report has been signed electronically. Number of Addenda: 0

## 2021-08-14 NOTE — Telephone Encounter (Signed)
Noted and agreed, thank you for the update.

## 2021-08-14 NOTE — Anesthesia Preprocedure Evaluation (Addendum)
Anesthesia Evaluation  Patient identified by MRN, date of birth, ID band Patient awake    Reviewed: Allergy & Precautions, NPO status , Patient's Chart, lab work & pertinent test results  Airway Mallampati: III  TM Distance: >3 FB Neck ROM: Full    Dental  (+) Teeth Intact, Dental Advisory Given   Pulmonary COPD,  COPD inhaler, former smoker,    breath sounds clear to auscultation       Cardiovascular negative cardio ROS   Rhythm:Regular Rate:Normal     Neuro/Psych PSYCHIATRIC DISORDERS Anxiety Depression    GI/Hepatic negative GI ROS, Neg liver ROS,   Endo/Other  negative endocrine ROS  Renal/GU negative Renal ROS     Musculoskeletal negative musculoskeletal ROS (+)   Abdominal Normal abdominal exam  (+)   Peds  Hematology negative hematology ROS (+)   Anesthesia Other Findings   Reproductive/Obstetrics                            Anesthesia Physical Anesthesia Plan  ASA: 2  Anesthesia Plan: MAC   Post-op Pain Management:    Induction: Intravenous  PONV Risk Score and Plan: 0 and Propofol infusion  Airway Management Planned: Natural Airway and Simple Face Mask  Additional Equipment: None  Intra-op Plan:   Post-operative Plan:   Informed Consent: I have reviewed the patients History and Physical, chart, labs and discussed the procedure including the risks, benefits and alternatives for the proposed anesthesia with the patient or authorized representative who has indicated his/her understanding and acceptance.       Plan Discussed with: CRNA  Anesthesia Plan Comments:        Anesthesia Quick Evaluation

## 2021-08-15 LAB — CYTOLOGY - NON PAP

## 2021-08-15 NOTE — Telephone Encounter (Signed)
Patient says she is having swelling in her legs, thinks it could be a side effect of taking amiodarone, but she is not sure. She saw one of our doctors at the hospital  who discussed this possibility with her. Please call her back to discuss further.

## 2021-08-18 ENCOUNTER — Encounter (HOSPITAL_COMMUNITY): Payer: Self-pay | Admitting: Gastroenterology

## 2021-08-18 NOTE — Telephone Encounter (Signed)
Called pt to inform her about the message above

## 2021-08-18 NOTE — Telephone Encounter (Signed)
I reviewed these labs, BNP was normal as of the 18th.  Advised patient conservative measures including elevating her legs and wearing compression socks.  Can reevaluate swelling at upcoming office visit.

## 2021-08-18 NOTE — Telephone Encounter (Signed)
Called pt to inform her about the message above. Pt mention she got blood work week ago with her PCP and that she does not have insurance nor the money to continue drawing the same blood test.

## 2021-08-25 ENCOUNTER — Other Ambulatory Visit: Payer: Self-pay | Admitting: Physician Assistant

## 2021-08-25 NOTE — Telephone Encounter (Signed)
Last OV: 08/12/21  Next OV: none  Last filled: 08/07/21  Quantity: 30

## 2021-08-31 DIAGNOSIS — I471 Supraventricular tachycardia: Secondary | ICD-10-CM | POA: Insufficient documentation

## 2021-08-31 NOTE — Progress Notes (Signed)
Follow up visit  Subjective:   Kathryn Underwood, female    DOB: [birth year], 61 y.o.   MRN: [id number]   HPI  Chief Complaint  Patient presents with   Tachycardia    61 year old Caucasian female with alcohol abuse, nicotine dependence-recently quit, anxiety, depression, GERD, admitted with alcoholic pancreatitis/pancreatic pseudocyst, MAT  Patient is completely recovered from her acute illness back in June 2020.  She has no complaints today.  Denies any abdominal pain, palpitations, chest pain, shortness of breath, leg edema.  She is still currently on amiodarone.    Current Outpatient Medications:    albuterol (VENTOLIN HFA) 108 (90 Base) MCG/ACT inhaler, Inhale 2 puffs into the lungs every 6 (six) hours as needed for wheezing or shortness of breath., Disp: 8.5 g, Rfl: 1   amiodarone (PACERONE) 200 MG tablet, Take 1 tablet (200 mg total) by mouth 2 (two) times daily for 6 days., Disp: 12 tablet, Rfl: 0   amiodarone (PACERONE) 200 MG tablet, Take 1 tablet (200 mg total) by mouth daily., Disp: 30 tablet, Rfl: 1   ciprofloxacin (CIPRO) 500 MG tablet, Take 1 tablet (500 mg total) by mouth 2 (two) times daily., Disp: 6 tablet, Rfl: 0   Eszopiclone 3 MG TABS, Take 1 tablet (3 mg total) by mouth at bedtime as needed. Take immediately before bedtimeTake 3 mg by mouth at bedtime as needed for sleep. Take immediately before bedtime, Disp: 30 tablet, Rfl: 0   folic acid (FOLVITE) 1 MG tablet, Take 1 tablet (1 mg total) by mouth daily., Disp: 30 tablet, Rfl: 1   gabapentin (NEURONTIN) 300 MG capsule, Take 2 capsules (600 mg total) by mouth 2 (two) times daily., Disp: 60 capsule, Rfl: 0   mirtazapine (REMERON) 15 MG tablet, Take 1 tablet (15 mg total) by mouth at bedtime., Disp: 30 tablet, Rfl: 1   mometasone-formoterol (DULERA) 200-5 MCG/ACT AERO, Inhale 2 puffs into the lungs 2 (two) times daily., Disp: 13 g, Rfl: 1   QUEtiapine (SEROQUEL) 25 MG tablet, Take 1 tablet (25 mg total) by mouth at  bedtime., Disp: 30 tablet, Rfl: 1   thiamine 100 MG tablet, Take 1 tablet (100 mg total) by mouth daily., Disp: 30 tablet, Rfl: 1   Cardiovascular & other pertient studies:  Reviewed external labs and tests, independently interpreted  EKG 09/01/2021: Sinus rhythm 98 bpm  Rightward axis Poor R-wave progression Cannot exclude old anteroseptal infarct  Echocardiogram 08/05/2021: 1. Left ventricular ejection fraction, by estimation, is 50 to 55%. The  left ventricle has low normal function. The left ventricle has no regional  wall motion abnormalities. Left ventricular diastolic parameters are  consistent with Grade I diastolic dysfunction (impaired relaxation).   2. Right ventricular systolic function is normal. The right ventricular  size is normal.   3. The mitral valve is normal in structure. No evidence of mitral valve  regurgitation.   4. The aortic valve is tricuspid. Aortic valve regurgitation is not  visualized. No aortic stenosis is present.   5. The inferior vena cava is normal in size with greater than 50%  respiratory variability, suggesting right atrial pressure of 3 mmHg.   Comparison(s): Incessant PACs are seen during the study.   Recent labs: 08/12/2021: Glucose 104, BUN/Cr 6/0/46. EGFR 102. Na/K 139/3.8. Albumin 3.2. Protein 5.7. Rest of the CMP normal H/H 12/36. MCV 106. Platelets 613 TSH 6.3 normal   Review of Systems  Cardiovascular:  Negative for chest pain, dyspnea on exertion, leg swelling, palpitations and syncope.  Vitals:   09/01/21 0902  BP: (!) 123/45  Resp: 16    Body mass index is 25.58 kg/m. Filed Weights   09/01/21 0902  Weight: 144 lb 6.4 oz (65.5 kg)     Objective:   Physical Exam Vitals and nursing note reviewed.  Constitutional:      General: She is not in acute distress. Neck:     Vascular: No JVD.  Cardiovascular:     Rate and Rhythm: Normal rate and regular rhythm.     Heart sounds: Normal heart sounds. No murmur  heard. Pulmonary:     Effort: Pulmonary effort is normal.     Breath sounds: Normal breath sounds. No wheezing or rales.  Musculoskeletal:     Right lower leg: No edema.     Left lower leg: No edema.             Visit diagnoses:   ICD-10-CM   1. Multifocal atrial tachycardia (HCC)  I47.1 EKG 12-Lead       Orders Placed This Encounter  Procedures   EKG 12-Lead     Assessment & Recommendations:   61 year old Caucasian female with alcohol abuse, nicotine dependence-recently quit, anxiety, depression, GERD, admitted with alcoholic pancreatitis/pancreatic pseudocyst.  Cardiology consulted for management of tachyarrhythmia.   MAT: Occurred in the setting of acute illness with pancreatic pseudocyst. Underlying COPD, hypoxia. EKG today shows sinus rhythm, no MAT. Discontinue amiodarone again and follow-up in 3 months.  In the meantime, if she has recurrence of palpitations, will then consider telemetry.      Nigel Mormon, MD Pager: [phone] Office: [phone]

## 2021-09-01 ENCOUNTER — Ambulatory Visit: Payer: Self-pay | Admitting: Cardiology

## 2021-09-01 ENCOUNTER — Encounter: Payer: Self-pay | Admitting: Cardiology

## 2021-09-01 VITALS — BP 107/69 | HR 117 | Temp 97.8°F | Resp 16 | Ht 63.0 in | Wt 144.4 lb

## 2021-09-01 DIAGNOSIS — I471 Supraventricular tachycardia: Secondary | ICD-10-CM

## 2021-09-02 ENCOUNTER — Encounter: Payer: Self-pay | Admitting: Gastroenterology

## 2021-09-02 NOTE — Progress Notes (Signed)
[facility] laboratories  Pancreatic body cyst fluid analysis CEA 33 ng/mL Amylase 1294 units/L  This is most consistent with a pancreatic pseudocyst based on fluid study analysis.  Patient will benefit from repeat imaging in the course of the coming months as well as follow-up to ensure overall alcohol cessation.  Consider repeat CT abdomen versus MRI abdomen in 3 months to evaluate PD dilation and follow-up cysts.  This can be done by patient's primary GI team if she decides to follow-up at [facility].  Follow-up can be arranged in clinic.   Justice Britain, MD [facility] Office # [phone]

## 2021-09-02 NOTE — Progress Notes (Signed)
Left message on machine to call back  

## 2021-09-03 NOTE — Progress Notes (Signed)
Left message on machine to call back  

## 2021-09-04 ENCOUNTER — Telehealth: Payer: Self-pay | Admitting: Gastroenterology

## 2021-09-04 NOTE — Telephone Encounter (Signed)
PT is returning call from yesterday. Please advise. Thank you

## 2021-09-04 NOTE — Telephone Encounter (Signed)
See alternate phone note  

## 2021-09-04 NOTE — Progress Notes (Signed)
Left message on machine to call back  

## 2021-09-05 NOTE — Progress Notes (Signed)
Left message on machine to call back  

## 2021-09-08 NOTE — Progress Notes (Signed)
I have been unable to reach the pt by phone letter to be mailed.

## 2021-09-11 NOTE — Telephone Encounter (Signed)
[facility] laboratories  Pancreatic body cyst fluid analysis CEA 33 ng/mL Amylase 1294 units/L  This is most consistent with a pancreatic pseudocyst based on fluid study analysis.  Patient will benefit from repeat imaging in the course of the coming months as well as follow-up to ensure overall alcohol cessation.   Consider repeat CT abdomen versus MRI abdomen in 3 months to evaluate PD dilation and follow-up cysts.  This can be done by patient's primary GI team if she decides to follow-up at [facility].  Follow-up can be arranged in clinic.     Justice Britain, MD [facility] Office # [phone]

## 2021-09-11 NOTE — Telephone Encounter (Signed)
Inbound call from patient returning call. Please give a call to further advise.  Thank you  

## 2021-09-11 NOTE — Telephone Encounter (Signed)
The pt has been advised and will keep appt with Nevin Bloodgood for tomorrow to discuss future treatment or follow up.Marland Kitchen

## 2021-09-12 ENCOUNTER — Encounter: Payer: Self-pay | Admitting: Nurse Practitioner

## 2021-09-12 ENCOUNTER — Ambulatory Visit (INDEPENDENT_AMBULATORY_CARE_PROVIDER_SITE_OTHER): Payer: Self-pay | Admitting: Nurse Practitioner

## 2021-09-12 VITALS — BP 110/72 | HR 95 | Ht 63.0 in | Wt 146.0 lb

## 2021-09-12 DIAGNOSIS — K852 Alcohol induced acute pancreatitis without necrosis or infection: Secondary | ICD-10-CM

## 2021-09-12 NOTE — Progress Notes (Signed)
Chief Complaint:  follow up after EUS   Assessment &  Plan   # 61 yo female with recent prolonged admission for acute Etoh pancreatitis complicated by large pseudocyst compressing stomach and some concern for pancreatic mass. Aspiration of fluid by IR was negative for malignancy. Outpatient EUS with aspiration of one of the fluid collections was performed last month by Dr. Ardis Hughs. No evidence for a pancreatic mass or chronic pancreatitis. Aspirated fluid showed CEA of 33, Amylase 1294. Cytology negative.  She looks and feels well. No abdominal pain. She hasn't had any Etoh since hospital discharge.  Continue to avoid Etoh. I will discuss with Dr. Henrene Pastor to see if any follow up imaging will be needed.   # Splenic artery thrombosis, ? 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 She was not anticoagulated in hospital due to concern for pancreatic hemorrhage as suggested on follow up CT scan.   # Colon cancer screening. She doesn't want to pursue screening colonoscopy due to cost. She is self pay and has to large hospital bill. No [place] of colon cancer.  She inquires about Cologuard. This is an option but I explained that if it was positive that she would need a colonoscopy. She will make some calls to find about cost of Cologuard. If decides to pursue then will let me know.    # Claustrophobia. Had difficult time during MRI   HPI   Kathryn Underwood is a 61 y.o. female known to Dr. Henrene Pastor from a recent prolonged hospital admission . Her past medical history is significant for Etoh pancreatitis, anxiety, depression. See PMH /PSH for additional history.   Kathryn Underwood was admitted to the hospital the end of June with acute alcoholic pancreatitis.  We saw her in consultation on 07/24/2021.  CT scan showed a large fluid collection compressing the stomach.  There was also concern for underlying pancreatic mass but it was difficult to sort out in the midst of acute pancreatitis.  She was treated with vigorous hydration,  supportive care.  There was mention of dark stools and she was Hemoccult positive but no evidence for significant GI bleeding.   Her hemoglobin did decline some but that was in the setting of IV hydration.  Her BUN was normal. Her hospitalization was also complicated by persistent tachycardia and respiratory failure.  At one point she was transferred to ICU for altered mental status which was probably multifactorial (narcotics, may be EtOH withdrawal). Repeat CT scan on 7/2 showed a slight increase in now heterogeneous pancreatic lesion measuring 6.2 x 3.2 cm, up from 5.7 x 2.7 cm.  Increase in density of lesion concerning for underlying hemorrhage.  Once medically stable Interventional Radiology saw patient, removed ~ 1 liter of dark fluid. .Cytology was negative for neoplastic cells.  She was discharged on 7/12 with plans for outpatient EUS to evaluate for ? Pancreatic mass.   Kathryn Underwood had an EUS with Dr. Ardis Hughs on 08/14/21. No signs of pancreatic mass or chronic pancreatitis. 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 He aspirated one of the fluid collections, apparently completely, and the fluid resembled the 1.2 L aspirated by interventional radiology 3 or 4 weeks ago prior (thin, brown fluid). Fluid sent for cytology, CEA and amylase levels. No discrete mass lesions in the liver even at the site of pancreatic duct caliber change in the body of the pancreas. Advised to follow-up as outpatient with Dr. Scarlette Shorts   Interval History:  She is slowly starting to get return of stamina. No Etoh use since leaving hospital. No abdominal pain. No nausea / vomiting. Her appetite is good. Bowel movements are normal.    Previous GI Evaluation          Labs:   CA 19-9 normal at 13     Latest Ref Rng & Units 08/12/2021   11:45 AM 08/06/2021    2:24 AM 08/05/2021    3:27 AM  CBC  WBC 4.0 - 10.5 K/uL 10.3  8.6  10.2    Hemoglobin 12.0 - 15.0 g/dL 12.2  11.7  13.1   Hematocrit 36.0 - 46.0 % 36.6  36.0  40.0   Platelets 150.0 - 400.0 K/uL 613.0  521  508        Latest Ref Rng & Units 08/12/2021   11:45 AM 08/01/2021   12:39 AM 07/31/2021    2:07 AM  Hepatic Function  Total Protein 6.0 - 8.3 g/dL 5.7  4.2  4.6   Albumin 3.5 - 5.2 g/dL 3.2  2.1  2.3   AST 0 - 37 U/L '25  21  21   ' ALT 0 - 35 U/L '23  12  13   ' Alk Phosphatase 39 - 117 U/L 105  64  78   Total Bilirubin 0.2 - 1.2 mg/dL 0.2  0.8  0.7      Past Medical History:  Diagnosis Date   Alcohol abuse    Anxiety    Chickenpox    Depression    Neuromuscular disorder (HCC)    neuropathy   Substance abuse ([place])     Past Surgical History:  Procedure Laterality Date   CESAREAN SECTION     ESOPHAGOGASTRODUODENOSCOPY N/A 08/14/2021   Procedure: ESOPHAGOGASTRODUODENOSCOPY (EGD);  Surgeon: Milus Banister, MD;  Location: [facility];  Service: Gastroenterology;  Laterality: N/A;   EUS N/A 08/14/2021   Procedure: UPPER ENDOSCOPIC ULTRASOUND (EUS) RADIAL;  Surgeon: Milus Banister, MD;  Location: [facility];  Service: Gastroenterology;  Laterality: N/A;   FINE NEEDLE ASPIRATION N/A 08/14/2021   Procedure: FINE NEEDLE ASPIRATION (FNA) LINEAR;  Surgeon: Milus Banister, MD;  Location: [facility];  Service: Gastroenterology;  Laterality: N/A;   TONSILLECTOMY      Current Medications, Allergies, Family History and Social History were reviewed in [employer] record.     Current Outpatient Medications  Medication Sig Dispense Refill   albuterol (VENTOLIN HFA) 108 (90 Base) MCG/ACT inhaler Inhale 2 puffs into the lungs every 6 (six) hours as needed for wheezing or shortness of breath. 8.5 g 1   folic acid (FOLVITE) 1 MG tablet Take 1 tablet (1 mg total) by mouth daily. 30 tablet 1   gabapentin (NEURONTIN) 300 MG capsule Take 2 capsules (600 mg total) by mouth 2 (two) times daily. 60 capsule 0   mirtazapine (REMERON) 15 MG  tablet Take 1 tablet (15 mg total) by mouth at bedtime. 30 tablet 1   mometasone-formoterol (DULERA) 200-5 MCG/ACT AERO Inhale 2 puffs into the lungs 2 (two) times daily. 13 g 1   naltrexone (DEPADE) 50 MG tablet Take 50 mg by mouth daily.     QUEtiapine (SEROQUEL) 25 MG tablet Take 1 tablet (25 mg total) by mouth at bedtime. 30 tablet 1   thiamine 100 MG tablet Take 1 tablet (100 mg total) by mouth daily. (Patient not taking: Reported on 09/12/2021) 30 tablet 1   No current facility-administered medications for this visit.  Review of Systems: No chest pain. No shortness of breath. No urinary complaints.    Physical Exam  Wt Readings from Last 3 Encounters:  09/12/21 146 lb (66.2 kg)  09/01/21 144 lb 6.4 oz (65.5 kg)  08/14/21 149 lb 11.1 oz (67.9 kg)    BP 110/72   Pulse 95   Ht '5\' 3"'  (1.6 m)   Wt 146 lb (66.2 kg)   BMI 25.86 kg/m  Constitutional:  Generally well appearing female in no acute distress. Psychiatric: Pleasant. Normal mood and affect. Behavior is normal. EENT: Pupils normal.  Conjunctivae are normal. No scleral icterus. Neck supple.  Cardiovascular: Normal rate, regular rhythm. No edema Pulmonary/chest: Effort normal and breath sounds normal. No wheezing, rales or rhonchi. Abdominal: Soft, protuberant, nontender. Bowel sounds active throughout. There are no masses palpable. No hepatomegaly. Neurological: Alert and oriented to person place and time. Skin: Skin is warm and dry. No rashes noted.  Tye Savoy, NP  09/12/2021, 9:57 AM I spent 30 minutes total reviewing records, obtaining history, performing exam, counseling patient and documenting visit / findings.   Cc:  Allwardt, Randa Evens, PA-C

## 2021-09-12 NOTE — Patient Instructions (Addendum)
If you are age 62 or younger, your body mass index should be between 19-25. Your Body mass index is 25.86 kg/m. If this is out of the aformentioned range listed, please consider follow up with your Primary Care Provider.  ________________________________________________________  The [facility] providers would like to encourage you to use [facility] to communicate with providers for non-urgent requests or questions.  Due to long hold times on the telephone, sending your provider a message by [facility] may be a faster and more efficient way to get a response.  Please allow 48 business hours for a response.  Please remember that this is for non-urgent requests.  _______________________________________________________  Follow up as needed.   Thank you for entrusting me with your care and choosing [facility].  Wallace Going, NP-C

## 2021-09-16 NOTE — Progress Notes (Signed)
PG, Excellent note. No imaging for now, but have her follow up with you in the office in 3 months. Interval imaging for clinical change. Thanks. JP

## 2021-09-17 ENCOUNTER — Telehealth: Payer: Self-pay

## 2021-09-17 NOTE — Telephone Encounter (Signed)
-----   Message from Willia Craze, NP sent at 09/16/2021  4:45 PM EDT ----- Aileen Pilot,  Please call patient and get her in to see me for a follow up in 3 months. Call in meantime if has recurrent abdominal pain . Thanks

## 2021-09-17 NOTE — Telephone Encounter (Signed)
Spoke with pt and gave pt Kathryn Underwood's message. Let pt know that our schedule is not out that far yet but that I have placed a recall and we will contact her for an appointment in 3 months. Also let pt know that if she has recurrent abd pain in the meantime she should contact us. Pt verbalized understanding and had no other concerns at end of call.

## 2021-10-20 ENCOUNTER — Encounter: Payer: Self-pay | Admitting: *Deleted

## 2021-11-05 DIAGNOSIS — F330 Major depressive disorder, recurrent, mild: Secondary | ICD-10-CM | POA: Insufficient documentation

## 2021-12-03 ENCOUNTER — Ambulatory Visit: Payer: Self-pay | Admitting: Cardiology

## 2022-01-08 ENCOUNTER — Encounter: Payer: Self-pay | Admitting: *Deleted

## 2022-02-03 ENCOUNTER — Encounter: Payer: Self-pay | Admitting: Nurse Practitioner

## 2022-02-12 ENCOUNTER — Other Ambulatory Visit (HOSPITAL_BASED_OUTPATIENT_CLINIC_OR_DEPARTMENT_OTHER): Payer: Self-pay

## 2022-04-02 ENCOUNTER — Other Ambulatory Visit (HOSPITAL_BASED_OUTPATIENT_CLINIC_OR_DEPARTMENT_OTHER): Payer: Self-pay

## 2022-08-28 ENCOUNTER — Other Ambulatory Visit (HOSPITAL_BASED_OUTPATIENT_CLINIC_OR_DEPARTMENT_OTHER): Payer: Self-pay

## 2022-12-08 ENCOUNTER — Other Ambulatory Visit: Payer: Self-pay

## 2022-12-08 ENCOUNTER — Emergency Department (HOSPITAL_BASED_OUTPATIENT_CLINIC_OR_DEPARTMENT_OTHER): Payer: Medicaid Other

## 2022-12-08 ENCOUNTER — Inpatient Hospital Stay (HOSPITAL_BASED_OUTPATIENT_CLINIC_OR_DEPARTMENT_OTHER)
Admission: EM | Admit: 2022-12-08 | Discharge: 2022-12-18 | DRG: 423 | Disposition: A | Discharge status: Home / Self Care | Payer: Medicaid Other | Attending: Student | Admitting: Student

## 2022-12-08 ENCOUNTER — Encounter (HOSPITAL_BASED_OUTPATIENT_CLINIC_OR_DEPARTMENT_OTHER): Payer: Self-pay

## 2022-12-08 ENCOUNTER — Other Ambulatory Visit (HOSPITAL_BASED_OUTPATIENT_CLINIC_OR_DEPARTMENT_OTHER): Payer: Self-pay

## 2022-12-08 DIAGNOSIS — F17210 Nicotine dependence, cigarettes, uncomplicated: Secondary | ICD-10-CM | POA: Diagnosis present

## 2022-12-08 DIAGNOSIS — K861 Other chronic pancreatitis: Secondary | ICD-10-CM | POA: Diagnosis not present

## 2022-12-08 DIAGNOSIS — R1110 Vomiting, unspecified: Secondary | ICD-10-CM | POA: Diagnosis not present

## 2022-12-08 DIAGNOSIS — K208 Other esophagitis without bleeding: Secondary | ICD-10-CM | POA: Diagnosis not present

## 2022-12-08 DIAGNOSIS — K8689 Other specified diseases of pancreas: Principal | ICD-10-CM | POA: Diagnosis present

## 2022-12-08 DIAGNOSIS — G629 Polyneuropathy, unspecified: Secondary | ICD-10-CM | POA: Diagnosis present

## 2022-12-08 DIAGNOSIS — R7989 Other specified abnormal findings of blood chemistry: Secondary | ICD-10-CM | POA: Diagnosis not present

## 2022-12-08 DIAGNOSIS — Z939 Artificial opening status, unspecified: Secondary | ICD-10-CM | POA: Diagnosis not present

## 2022-12-08 DIAGNOSIS — K2101 Gastro-esophageal reflux disease with esophagitis, with bleeding: Secondary | ICD-10-CM | POA: Diagnosis present

## 2022-12-08 DIAGNOSIS — Z833 Family history of diabetes mellitus: Secondary | ICD-10-CM

## 2022-12-08 DIAGNOSIS — Z825 Family history of asthma and other chronic lower respiratory diseases: Secondary | ICD-10-CM

## 2022-12-08 DIAGNOSIS — K859 Acute pancreatitis without necrosis or infection, unspecified: Secondary | ICD-10-CM | POA: Diagnosis not present

## 2022-12-08 DIAGNOSIS — K311 Adult hypertrophic pyloric stenosis: Secondary | ICD-10-CM | POA: Diagnosis present

## 2022-12-08 DIAGNOSIS — C259 Malignant neoplasm of pancreas, unspecified: Secondary | ICD-10-CM | POA: Diagnosis not present

## 2022-12-08 DIAGNOSIS — I959 Hypotension, unspecified: Secondary | ICD-10-CM | POA: Diagnosis present

## 2022-12-08 DIAGNOSIS — E876 Hypokalemia: Secondary | ICD-10-CM | POA: Diagnosis present

## 2022-12-08 DIAGNOSIS — K2100 Gastro-esophageal reflux disease with esophagitis, without bleeding: Secondary | ICD-10-CM | POA: Diagnosis present

## 2022-12-08 DIAGNOSIS — G621 Alcoholic polyneuropathy: Secondary | ICD-10-CM | POA: Diagnosis present

## 2022-12-08 DIAGNOSIS — J439 Emphysema, unspecified: Secondary | ICD-10-CM | POA: Diagnosis present

## 2022-12-08 DIAGNOSIS — R1319 Other dysphagia: Secondary | ICD-10-CM | POA: Diagnosis not present

## 2022-12-08 DIAGNOSIS — K862 Cyst of pancreas: Secondary | ICD-10-CM | POA: Diagnosis present

## 2022-12-08 DIAGNOSIS — I81 Portal vein thrombosis: Secondary | ICD-10-CM | POA: Diagnosis present

## 2022-12-08 DIAGNOSIS — I82890 Acute embolism and thrombosis of other specified veins: Secondary | ICD-10-CM | POA: Diagnosis present

## 2022-12-08 DIAGNOSIS — R531 Weakness: Secondary | ICD-10-CM | POA: Diagnosis present

## 2022-12-08 DIAGNOSIS — B029 Zoster without complications: Secondary | ICD-10-CM | POA: Diagnosis present

## 2022-12-08 DIAGNOSIS — L989 Disorder of the skin and subcutaneous tissue, unspecified: Secondary | ICD-10-CM | POA: Diagnosis present

## 2022-12-08 DIAGNOSIS — E43 Unspecified severe protein-calorie malnutrition: Secondary | ICD-10-CM | POA: Diagnosis present

## 2022-12-08 DIAGNOSIS — Z681 Body mass index (BMI) 19 or less, adult: Secondary | ICD-10-CM

## 2022-12-08 DIAGNOSIS — E873 Alkalosis: Secondary | ICD-10-CM | POA: Diagnosis present

## 2022-12-08 DIAGNOSIS — R590 Localized enlarged lymph nodes: Secondary | ICD-10-CM | POA: Diagnosis present

## 2022-12-08 DIAGNOSIS — K828 Other specified diseases of gallbladder: Secondary | ICD-10-CM | POA: Diagnosis present

## 2022-12-08 DIAGNOSIS — Z8261 Family history of arthritis: Secondary | ICD-10-CM

## 2022-12-08 DIAGNOSIS — R64 Cachexia: Secondary | ICD-10-CM | POA: Diagnosis present

## 2022-12-08 DIAGNOSIS — E860 Dehydration: Secondary | ICD-10-CM | POA: Diagnosis present

## 2022-12-08 DIAGNOSIS — K222 Esophageal obstruction: Secondary | ICD-10-CM | POA: Diagnosis present

## 2022-12-08 DIAGNOSIS — C250 Malignant neoplasm of head of pancreas: Principal | ICD-10-CM | POA: Diagnosis present

## 2022-12-08 DIAGNOSIS — K863 Pseudocyst of pancreas: Secondary | ICD-10-CM | POA: Diagnosis present

## 2022-12-08 DIAGNOSIS — K2289 Other specified disease of esophagus: Secondary | ICD-10-CM | POA: Diagnosis not present

## 2022-12-08 DIAGNOSIS — K831 Obstruction of bile duct: Secondary | ICD-10-CM | POA: Diagnosis present

## 2022-12-08 DIAGNOSIS — J449 Chronic obstructive pulmonary disease, unspecified: Secondary | ICD-10-CM | POA: Diagnosis not present

## 2022-12-08 DIAGNOSIS — K449 Diaphragmatic hernia without obstruction or gangrene: Secondary | ICD-10-CM | POA: Diagnosis present

## 2022-12-08 DIAGNOSIS — R1314 Dysphagia, pharyngoesophageal phase: Secondary | ICD-10-CM | POA: Diagnosis present

## 2022-12-08 DIAGNOSIS — R6510 Systemic inflammatory response syndrome (SIRS) of non-infectious origin without acute organ dysfunction: Secondary | ICD-10-CM | POA: Diagnosis not present

## 2022-12-08 DIAGNOSIS — Z820 Family history of epilepsy and other diseases of the nervous system: Secondary | ICD-10-CM

## 2022-12-08 DIAGNOSIS — E8809 Other disorders of plasma-protein metabolism, not elsewhere classified: Secondary | ICD-10-CM | POA: Diagnosis present

## 2022-12-08 DIAGNOSIS — M545 Low back pain, unspecified: Secondary | ICD-10-CM | POA: Diagnosis not present

## 2022-12-08 DIAGNOSIS — E46 Unspecified protein-calorie malnutrition: Secondary | ICD-10-CM

## 2022-12-08 DIAGNOSIS — R634 Abnormal weight loss: Secondary | ICD-10-CM

## 2022-12-08 DIAGNOSIS — R933 Abnormal findings on diagnostic imaging of other parts of digestive tract: Secondary | ICD-10-CM

## 2022-12-08 DIAGNOSIS — K209 Esophagitis, unspecified without bleeding: Secondary | ICD-10-CM

## 2022-12-08 DIAGNOSIS — K2210 Ulcer of esophagus without bleeding: Secondary | ICD-10-CM | POA: Diagnosis present

## 2022-12-08 DIAGNOSIS — Z79899 Other long term (current) drug therapy: Secondary | ICD-10-CM

## 2022-12-08 DIAGNOSIS — K2081 Other esophagitis with bleeding: Secondary | ICD-10-CM | POA: Diagnosis not present

## 2022-12-08 DIAGNOSIS — R188 Other ascites: Secondary | ICD-10-CM | POA: Diagnosis present

## 2022-12-08 DIAGNOSIS — F418 Other specified anxiety disorders: Secondary | ICD-10-CM | POA: Diagnosis not present

## 2022-12-08 DIAGNOSIS — R748 Abnormal levels of other serum enzymes: Secondary | ICD-10-CM | POA: Diagnosis not present

## 2022-12-08 DIAGNOSIS — R350 Frequency of micturition: Secondary | ICD-10-CM | POA: Diagnosis not present

## 2022-12-08 DIAGNOSIS — I748 Embolism and thrombosis of other arteries: Secondary | ICD-10-CM | POA: Diagnosis not present

## 2022-12-08 DIAGNOSIS — Z7901 Long term (current) use of anticoagulants: Secondary | ICD-10-CM | POA: Diagnosis not present

## 2022-12-08 DIAGNOSIS — F32A Depression, unspecified: Secondary | ICD-10-CM | POA: Diagnosis present

## 2022-12-08 DIAGNOSIS — F419 Anxiety disorder, unspecified: Secondary | ICD-10-CM | POA: Diagnosis present

## 2022-12-08 DIAGNOSIS — E871 Hypo-osmolality and hyponatremia: Secondary | ICD-10-CM | POA: Diagnosis not present

## 2022-12-08 DIAGNOSIS — K860 Alcohol-induced chronic pancreatitis: Secondary | ICD-10-CM | POA: Diagnosis present

## 2022-12-08 DIAGNOSIS — K3189 Other diseases of stomach and duodenum: Secondary | ICD-10-CM | POA: Diagnosis present

## 2022-12-08 DIAGNOSIS — F1010 Alcohol abuse, uncomplicated: Secondary | ICD-10-CM | POA: Diagnosis present

## 2022-12-08 LAB — CBC
HCT: 45.3 % (ref 36.0–46.0)
Hemoglobin: 15.2 g/dL — ABNORMAL HIGH (ref 12.0–15.0)
MCH: 33.2 pg (ref 26.0–34.0)
MCHC: 33.6 g/dL (ref 30.0–36.0)
MCV: 98.9 fL (ref 80.0–100.0)
Platelets: 424 10*3/uL — ABNORMAL HIGH (ref 150–400)
RBC: 4.58 MIL/uL (ref 3.87–5.11)
RDW: 13 % (ref 11.5–15.5)
WBC: 10.1 10*3/uL (ref 4.0–10.5)
nRBC: 0 % (ref 0.0–0.2)

## 2022-12-08 LAB — BASIC METABOLIC PANEL
Anion gap: 11 (ref 5–15)
BUN: 11 mg/dL (ref 8–23)
CO2: 37 mmol/L — ABNORMAL HIGH (ref 22–32)
Calcium: 9 mg/dL (ref 8.9–10.3)
Chloride: 87 mmol/L — ABNORMAL LOW (ref 98–111)
Creatinine, Ser: 0.6 mg/dL (ref 0.44–1.00)
GFR, Estimated: >60 mL/min (ref >60)
Glucose, Bld: 131 mg/dL — ABNORMAL HIGH (ref 70–99)
Potassium: 3.4 mmol/L — ABNORMAL LOW (ref 3.5–5.1)
Sodium: 135 mmol/L (ref 135–145)

## 2022-12-08 LAB — HEPATIC FUNCTION PANEL
ALT: 9 U/L (ref 0–44)
AST: 26 U/L (ref 15–41)
Albumin: 3.5 g/dL (ref 3.5–5.0)
Alkaline Phosphatase: 88 U/L (ref 38–126)
Bilirubin, Direct: 0.1 mg/dL (ref 0.0–0.2)
Indirect Bilirubin: 0.5 mg/dL (ref 0.3–0.9)
Total Bilirubin: 0.6 mg/dL (ref <1.2)
Total Protein: 7 g/dL (ref 6.5–8.1)

## 2022-12-08 LAB — AMYLASE: Amylase: 42 U/L (ref 28–100)

## 2022-12-08 LAB — LIPASE, BLOOD: Lipase: 57 U/L — ABNORMAL HIGH (ref 11–51)

## 2022-12-08 LAB — CBG MONITORING, ED: Glucose-Capillary: 105 mg/dL — ABNORMAL HIGH (ref 70–99)

## 2022-12-08 LAB — HIV ANTIBODY (ROUTINE TESTING W REFLEX): HIV Screen 4th Generation wRfx: NONREACTIVE

## 2022-12-08 MED ORDER — ACETAMINOPHEN 650 MG RE SUPP: 650.0000 mg | Freq: Four times a day (QID) | RECTAL | Status: DC | PRN

## 2022-12-08 MED ORDER — GABAPENTIN 300 MG PO CAPS
900.0000 mg | ORAL_CAPSULE | Freq: Every day | ORAL | Status: DC
  Administered 2022-12-08 – 2022-12-17 (×10): 900 mg via ORAL
  Filled 2022-12-08 (×10): qty 3

## 2022-12-08 MED ORDER — ACETAMINOPHEN 325 MG PO TABS
650.0000 mg | ORAL_TABLET | Freq: Four times a day (QID) | ORAL | Status: DC | PRN
  Administered 2022-12-11 – 2022-12-18 (×6): 650 mg via ORAL
  Filled 2022-12-08 (×6): qty 2

## 2022-12-08 MED ORDER — IOHEXOL 300 MG/ML  SOLN
100.0000 mL | Freq: Once | INTRAMUSCULAR | Status: AC | PRN
  Administered 2022-12-08: 75 mL via INTRAVENOUS

## 2022-12-08 MED ORDER — QUETIAPINE FUMARATE 100 MG PO TABS
100.0000 mg | ORAL_TABLET | Freq: Every day | ORAL | Status: DC
  Administered 2022-12-08 – 2022-12-15 (×8): 200 mg via ORAL
  Administered 2022-12-16: 100 mg via ORAL
  Administered 2022-12-17: 200 mg via ORAL
  Filled 2022-12-08 (×10): qty 2

## 2022-12-08 MED ORDER — ENOXAPARIN SODIUM 40 MG/0.4ML IJ SOSY
40.0000 mg | PREFILLED_SYRINGE | INTRAMUSCULAR | Status: DC
  Administered 2022-12-08 – 2022-12-12 (×5): 40 mg via SUBCUTANEOUS
  Filled 2022-12-08 (×5): qty 0.4

## 2022-12-08 MED ORDER — ONDANSETRON HCL 4 MG/2ML IJ SOLN
4.0000 mg | Freq: Four times a day (QID) | INTRAMUSCULAR | Status: DC | PRN
  Administered 2022-12-11 – 2022-12-12 (×2): 4 mg via INTRAVENOUS
  Filled 2022-12-08 (×2): qty 2

## 2022-12-08 MED ORDER — SENNOSIDES-DOCUSATE SODIUM 8.6-50 MG PO TABS
1.0000 | ORAL_TABLET | Freq: Every evening | ORAL | Status: DC | PRN
  Administered 2022-12-10: 1 via ORAL
  Filled 2022-12-08: qty 1

## 2022-12-08 MED ORDER — DEXTROSE-SODIUM CHLORIDE 5-0.45 % IV SOLN: INTRAVENOUS | Status: AC

## 2022-12-08 MED ORDER — POTASSIUM CHLORIDE 10 MEQ/100ML IV SOLN
10.0000 meq | INTRAVENOUS | Status: DC
  Administered 2022-12-08: 10 meq via INTRAVENOUS
  Filled 2022-12-08: qty 100

## 2022-12-08 MED ORDER — ONDANSETRON HCL 4 MG PO TABS: 4.0000 mg | ORAL_TABLET | Freq: Four times a day (QID) | ORAL | Status: DC | PRN

## 2022-12-08 MED ORDER — LORAZEPAM 2 MG/ML IJ SOLN
1.0000 mg | Freq: Once | INTRAMUSCULAR | Status: AC | PRN
  Administered 2022-12-09: 1 mg via INTRAVENOUS
  Filled 2022-12-08: qty 1

## 2022-12-08 MED ORDER — BOOST / RESOURCE BREEZE PO LIQD CUSTOM
1.0000 | Freq: Three times a day (TID) | ORAL | Status: DC
  Administered 2022-12-09 – 2022-12-15 (×10): 1 via ORAL

## 2022-12-08 MED ORDER — MIRTAZAPINE 30 MG PO TABS
45.0000 mg | ORAL_TABLET | Freq: Every day | ORAL | Status: DC
  Administered 2022-12-08 – 2022-12-17 (×10): 45 mg via ORAL
  Filled 2022-12-08 (×11): qty 1

## 2022-12-08 MED ORDER — POTASSIUM CHLORIDE CRYS ER 20 MEQ PO TBCR
20.0000 meq | EXTENDED_RELEASE_TABLET | Freq: Once | ORAL | Status: AC
  Administered 2022-12-08: 20 meq via ORAL
  Filled 2022-12-08: qty 1

## 2022-12-08 MED ORDER — IPRATROPIUM-ALBUTEROL 0.5-2.5 (3) MG/3ML IN SOLN: 3.0000 mL | Freq: Four times a day (QID) | RESPIRATORY_TRACT | Status: DC | PRN

## 2022-12-08 NOTE — ED Provider Notes (Signed)
[place] EMERGENCY DEPARTMENT AT [facility] Provider Note   CSN: [id number] Arrival date & time: 12/08/22  1130     History {Add pertinent medical, surgical, social history, OB history to HPI:1} Chief Complaint  Patient presents with   Weight Loss   Weakness    Kathryn Underwood is a 62 y.o. female.  HPI    62 year old Home Medications Prior to Admission medications   Medication Sig Start Date End Date Taking? Authorizing Provider  gabapentin (NEURONTIN) 300 MG capsule Take 2 capsules (600 mg total) by mouth 2 (two) times daily. Patient taking differently: Take 900 mg by mouth at bedtime. 10/09/17  Yes Rolan Bucco, MD  mirtazapine (REMERON) 45 MG tablet Take 45 mg by mouth at bedtime. 11/05/21  Yes [provider]  QUEtiapine (SEROQUEL) 100 MG tablet Take 100-200 mg by mouth at bedtime.   Yes [provider]  albuterol (VENTOLIN HFA) 108 (90 Base) MCG/ACT inhaler Inhale 2 puffs into the lungs every 6 (six) hours as needed for wheezing or shortness of breath. Patient not taking: Reported on 12/08/2022 08/06/21   Burnadette Pop, MD  folic acid (FOLVITE) 1 MG tablet Take 1 tablet (1 mg total) by mouth daily. Patient not taking: Reported on 12/08/2022 08/07/21   Burnadette Pop, MD  mirtazapine (REMERON) 15 MG tablet Take 1 tablet (15 mg total) by mouth at bedtime. Patient not taking: Reported on 12/08/2022 08/06/21   Burnadette Pop, MD  mometasone-formoterol (DULERA) 200-5 MCG/ACT AERO Inhale 2 puffs into the lungs 2 (two) times daily. Patient not taking: Reported on 12/08/2022 08/06/21   Burnadette Pop, MD  QUEtiapine (SEROQUEL) 25 MG tablet Take 1 tablet (25 mg total) by mouth at bedtime. Patient not taking: Reported on 12/08/2022 08/06/21   Burnadette Pop, MD  thiamine 100 MG tablet Take 1 tablet (100 mg total) by mouth daily. Patient not taking: Reported on 09/12/2021 08/07/21   Burnadette Pop, MD      Allergies    Patient has no known  allergies.    Review of Systems   Review of Systems  Physical Exam Updated Vital Signs BP 93/67   Pulse 95   Temp 98.1 F (36.7 C)   Resp 13   Ht 5\' 3"  (1.6 m)   Wt 49.9 kg   SpO2 92%   BMI 19.49 kg/m  Physical Exam  ED Results / Procedures / Treatments   Labs (all labs ordered are listed, but only abnormal results are displayed) Labs Reviewed  BASIC METABOLIC PANEL - Abnormal; Notable for the following components:      Result Value   Potassium 3.4 (*)    Chloride 87 (*)    CO2 37 (*)    Glucose, Bld 131 (*)    All other components within normal limits  CBC - Abnormal; Notable for the following components:   Hemoglobin 15.2 (*)    Platelets 424 (*)    All other components within normal limits  CBG MONITORING, ED - Abnormal; Notable for the following components:   Glucose-Capillary 105 (*)    All other components within normal limits  URINALYSIS, ROUTINE W REFLEX MICROSCOPIC  HEPATIC FUNCTION PANEL  LIPASE, BLOOD  AMYLASE  CANCER ANTIGEN 19-9    EKG None  Radiology CT CHEST ABDOMEN PELVIS W CONTRAST  Result Date: 12/08/2022 CLINICAL DATA:  Dysphagia, decreasing appetite and weight loss * Tracking Code: BO * EXAM: CT CHEST, ABDOMEN, AND PELVIS WITH CONTRAST TECHNIQUE: Multidetector CT imaging of the chest, abdomen and pelvis  was performed following the standard protocol during bolus administration of intravenous contrast. RADIATION DOSE REDUCTION: This exam was performed according to the departmental dose-optimization program which includes automated exposure control, adjustment of the mA and/or kV according to patient size and/or use of iterative reconstruction technique. CONTRAST:  75mL OMNIPAQUE IOHEXOL 300 MG/ML  SOLN COMPARISON:  CT chest, 07/24/2021, abdomen pelvis, 07/27/2021 FINDINGS: CT CHEST FINDINGS Cardiovascular: Scattered aortic atherosclerosis. Normal heart size. Left coronary artery calcifications. Small pericardial effusion. Mediastinum/Nodes: No  enlarged mediastinal, hilar, or axillary lymph nodes. Thyroid gland, trachea, and esophagus demonstrate no significant findings. Lungs/Pleura: Diminished ground-glass opacity of the medial left apex measuring 0.7 cm, previously 1.0 cm, nonspecific and infectious or inflammatory requiring no specific further follow-up (series 4, image 37). Mild centrilobular emphysema. Benign, bandlike scarring of the left lung base. No pleural effusion or pneumothorax. Musculoskeletal: No chest wall abnormality. No acute osseous findings. CT ABDOMEN PELVIS FINDINGS Hepatobiliary: Heterogeneous perfusion of the liver. Severe intra hepatic biliary ductal dilatation with a distended gallbladder. 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 Partial cavernous transformation. Pancreas: Very extensive, expansile soft tissue, fat stranding, and cystic change throughout the pancreatic head, encasing multiple adjacent structures including the portal vein and central mesenteric vessels, the superior mesenteric artery, and portions of the descending duodenum (series 2, image 66). Maximum apparent cross-section of the pancreatic head is 6.4 x 5.3 cm (series 2, image 71). Severe atrophy of the distal pancreatic parenchyma as well as diffuse ductal dilatation distally. Multiple fluid collections, including a collection overlying the pancreatic neck and body measuring 6.9 x 5.3 cm (series 2, image 69), and extending inferiorly from the lateral pancreatic head measuring 4.0 x 3.5 cm (series 2, image 77). Spleen: Normal in size without significant abnormality. Adrenals/Urinary Tract: Adenomatous thickening of the adrenal glands, requiring no specific further follow-up or characterization. Kidneys are normal, without renal calculi, solid lesion, or hydronephrosis. Bladder is unremarkable.  Stomach/Bowel: Stomach is within normal limits. Appendix appears normal. No evidence of bowel wall thickening, distention, or inflammatory changes. Vascular/Lymphatic: Aortic atherosclerosis. No enlarged abdominal or pelvic lymph nodes. Reproductive: Calcified uterine fibroids. Other: No abdominal wall hernia or abnormality. No ascites. Musculoskeletal: No acute osseous findings. IMPRESSION: 1. Very extensive, expansile soft tissue, fat stranding, and cystic change throughout the pancreatic head, encasing multiple adjacent structures including the portal vein and central mesenteric vessels, the superior mesenteric artery, and portions of the descending duodenum. Maximum apparent cross-section of the pancreatic head is 6.4 x 5.3 cm. 2. Severe intrahepatic biliary ductal dilatation with a distended gallbladder. 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 3. Multiple peripancreatic fluid collections, including a collection overlying the pancreatic neck and body measuring 6.9 x 5.3 cm, and extending inferiorly from the lateral pancreatic head measuring 4.0 x 3.5 cm, of uncertain acuity, consistent with acute pancreatic fluid collections or pseudocysts. The presence or absence of infection within this fluid is not established by imaging. 4. Constellation of findings is of uncertain significance, highly concerning for pancreatic adenocarcinoma complicated by pancreatitis, however at least conceivably this could reflect benign, although unusually severe acute on chronic pancreatitis. Although patient has an established imaging history of pancreatitis complicated  by pseudocysts, findings within the pancreatic head are completely new when compared to relatively recent examinations dated 2023. 5. No evidence of lymphadenopathy or metastatic  disease in the chest, abdomen, or pelvis. 6. Emphysema. 7. Coronary artery disease. Aortic Atherosclerosis (ICD10-I70.0) and Emphysema (ICD10-J43.9). Electronically Signed   By: Jearld Lesch M.D.   On: 12/08/2022 14:56    Procedures Procedures  {Document cardiac monitor, telemetry assessment procedure when appropriate:1}  Medications Ordered in ED Medications  iohexol (OMNIPAQUE) 300 MG/ML solution 100 mL (75 mLs Intravenous Contrast Given 12/08/22 1425)    ED Course/ Medical Decision Making/ A&P   {   Click here for ABCD2, HEART and other calculatorsREFRESH Note before signing :1}                              Medical Decision Making Amount and/or Complexity of Data Reviewed Labs: ordered. Radiology: ordered.  Risk Prescription drug management. Decision regarding hospitalization.   ***  {Document critical care time when appropriate:1} {Document review of labs and clinical decision tools ie heart score, Chads2Vasc2 etc:1}  {Document your independent review of radiology images, and any outside records:1} {Document your discussion with family members, caretakers, and with consultants:1} {Document social determinants of health affecting pt's care:1} {Document your decision making why or why not admission, treatments were needed:1} Final Clinical Impression(s) / ED Diagnoses Final diagnoses:  None    Rx / DC Orders ED Discharge Orders     None

## 2022-12-08 NOTE — ED Notes (Signed)
Kathryn Underwood with cl called for transport

## 2022-12-08 NOTE — ED Triage Notes (Signed)
Pt caox4, ambulatory c/o decreased appetite x2 months and decreased weight loss over the past 1.5 yrs. Hx pancreatitis. Pt states she has a hard time taking meds and eating because she "feels like everything gets stuck so she has to make herself throw up."

## 2022-12-08 NOTE — H&P (Signed)
History and Physical    Patient: Kathryn Underwood DGU:[id number] DOB: 1960/02/06 DOA: 12/08/2022 DOS: the patient was seen and examined on 12/08/2022 PCP: Allwardt, Crist Infante, PA-C  Patient coming from:  [facility]  Chief Complaint:  Chief Complaint  Patient presents with   Weight Loss   Weakness   HPI: Kathryn Underwood is a 62 y.o. female with medical history significant of anxiety and depression, GERD, neuropathy, alcoholic pancreatitis, pancreatic pseudocyst and alcohol abuse who presented to the [facility] ED due to decreased appetite over the last few months with associated weight loss. Patient reports that she weighed 122 lbs in July 2023 but over the last few months, she has noticed a drastic weight loss with her weight now at 110 lbs on admission. Over the last 2 months, she has had decreased appetite as well as fatigue. She reports a feeling of something getting stuck in her chest when she takes her pill causing her to force herself to vomit to relieve the symptoms.  She reports occasional black stools but denies any abdominal pain, nausea, vomiting, constipation, diarrhea, chest pain, shortness of breath, cough, dizziness, night sweats, vision changes or headaches.  [facility] ED course:  Tachycardic to 100s with mild hypotension with SBP in the 90s. Labs showed WBC 10.1, Hgb 15.2, platelet 424, sodium 135, K+ 3.4, bicarb 37, creatinine 0.6, amylase 42, lipase 57, normal LFTs and CBG of 105. EKG shows sinus tach with occasional PVCs CT C/A/P showed new mass in the head of the pancreas concerning for possible pancreatic adenocarcinoma complicated by pancreatitis.  Patient started on D5 NS and admitted to the [facility] service at [facility].   Review of Systems: As mentioned in the history of present illness. All other systems reviewed and are negative. Past Medical History:  Diagnosis Date   Alcohol abuse    Anxiety    Chickenpox    Depression    Neuromuscular disorder (HCC)     neuropathy   Substance abuse (HCC)    Past Surgical History:  Procedure Laterality Date   CESAREAN SECTION     ESOPHAGOGASTRODUODENOSCOPY N/A 08/14/2021   Procedure: ESOPHAGOGASTRODUODENOSCOPY (EGD);  Surgeon: Rachael Fee, MD;  Location: [facility];  Service: Gastroenterology;  Laterality: N/A;   EUS N/A 08/14/2021   Procedure: UPPER ENDOSCOPIC ULTRASOUND (EUS) RADIAL;  Surgeon: Rachael Fee, MD;  Location: [facility];  Service: Gastroenterology;  Laterality: N/A;   FINE NEEDLE ASPIRATION N/A 08/14/2021   Procedure: FINE NEEDLE ASPIRATION (FNA) LINEAR;  Surgeon: Rachael Fee, MD;  Location: [facility]OSCOPY;  Service: Gastroenterology;  Laterality: N/A;   TONSILLECTOMY     Social History:  reports that she has been smoking cigarettes. She has never used smokeless tobacco. She reports that she does not currently use alcohol after a past usage of about 16.0 standard drinks of alcohol per week. She reports that she does not currently use drugs after having used the following drugs: Cocaine and Marijuana.  No Known Allergies  Family History  Problem Relation Age of Onset   Dementia Mother    Arthritis Father    Asthma Father    Asthma Brother    Diabetes Maternal Grandmother    Dementia Maternal Grandmother    Diabetes Paternal Grandmother    Parkinson's disease Maternal Grandfather    Parkinson's disease Paternal Grandfather    Breast cancer Neg Hx    Colon cancer Neg Hx     Prior to Admission medications   Medication Sig Start Date End Date  Taking? Authorizing Provider  gabapentin (NEURONTIN) 300 MG capsule Take 2 capsules (600 mg total) by mouth 2 (two) times daily. Patient taking differently: Take 900 mg by mouth at bedtime. 10/09/17  Yes Rolan Bucco, MD  mirtazapine (REMERON) 45 MG tablet Take 45 mg by mouth at bedtime. 11/05/21  Yes [provider]  QUEtiapine (SEROQUEL) 100 MG tablet Take 100-200 mg by mouth at bedtime.   Yes [provider]   albuterol (VENTOLIN HFA) 108 (90 Base) MCG/ACT inhaler Inhale 2 puffs into the lungs every 6 (six) hours as needed for wheezing or shortness of breath. Patient not taking: Reported on 12/08/2022 08/06/21   Burnadette Pop, MD  folic acid (FOLVITE) 1 MG tablet Take 1 tablet (1 mg total) by mouth daily. Patient not taking: Reported on 12/08/2022 08/07/21   Burnadette Pop, MD  mirtazapine (REMERON) 15 MG tablet Take 1 tablet (15 mg total) by mouth at bedtime. Patient not taking: Reported on 12/08/2022 08/06/21   Burnadette Pop, MD  mometasone-formoterol (DULERA) 200-5 MCG/ACT AERO Inhale 2 puffs into the lungs 2 (two) times daily. Patient not taking: Reported on 12/08/2022 08/06/21   Burnadette Pop, MD  QUEtiapine (SEROQUEL) 25 MG tablet Take 1 tablet (25 mg total) by mouth at bedtime. Patient not taking: Reported on 12/08/2022 08/06/21   Burnadette Pop, MD  thiamine 100 MG tablet Take 1 tablet (100 mg total) by mouth daily. Patient not taking: Reported on 09/12/2021 08/07/21   Burnadette Pop, MD    Physical Exam: Vitals:   12/08/22 1252 12/08/22 1400 12/08/22 1415 12/08/22 1753  BP:  93/67 93/66 103/64  Pulse: (!) 106 95 98 99  Resp: 17 13 15    Temp:   98.1 F (36.7 C) 98.3 F (36.8 C)  TempSrc:   Oral Oral  SpO2: 95% 92% 90% 90%  Weight:      Height:       General: Pleasant, thin-appearing woman laying in bed. Evidence of facial muscle atrophy HEENT: [place]/AT. Anicteric sclera.   Neck: Supple. No lymphadenopathy.  Normal ROM. CV: Mild tachycardia.  Regular rhythm. No murmurs, rubs, or gallops. No LE edema Pulmonary: Lungs CTAB. Normal effort. No wheezing or rales. Abdominal: Soft, nontender, nondistended. Normal bowel sounds. Extremities: Palpable radial and DP pulses. Normal ROM. Skin: Warm and dry. No obvious rash or lesions. Neuro: A&Ox3. Moves all extremities. Normal sensation to light touch. No focal deficit. Psych: Normal mood and affect  Data Reviewed:  WBC 10.1, Hgb 15.2,  platelet 424, sodium 135, K+ 3.4, bicarb 37, creatinine 0.6, amylase 42, lipase 57, normal LFTs and CBG of 105. EKG shows sinus tach with occasional PVCs CT C/A/P showed new mass in the head of the pancreas concerning for possible pancreatic adenocarcinoma complicated by pancreatitis  Assessment and Plan: Kathryn Underwood is a 62 y.o. female with medical history significant of anxiety and depression, GERD, neuropathy, alcoholic pancreatitis, pancreatic pseudocyst and alcohol abuse who presented to the [facility] ED due to decreased appetite over the last few months with associated weight loss and found to have new pancreatic mass.  # New pancreatic mass # Unintentional weight loss # Decreased appetite Patient with a history of alcoholic pancreatitis complicated by pseudocyst presented to the ED with unintentional weight loss, fatigue and decreased appetite. She is found to have a new pancreatic head mass concerning for possible pancreatic adenocarcinoma.. She denies any family history of cancers. Discussed imaging finding with patient with plan to get MRCP in the morning. -Oncology consulted, plan to see  in the morning -MRCP with pancreatic protocol, one-time dose of IV Ativan 1 mg prior to MRI -N.p.o. at midnight -Continue D5 1/2NS at 100 cc/h, w/ CBG monitoring -Protein supplements between meals -Follow-up CA 19.9 -Registered dietitian consult, appreciate recs  #Hx alcoholic pancreatitis #Hx of pancreatic pseudocyst She denies current EtOH use. Imaging on admission showed "Multiple peripancreatic fluid collections, including a collection overlying the pancreatic neck and body measuring 6.9 x 5.3 cm, and extending inferiorly from the lateral pancreatic head measuring 4.0 x 3.5 cm, of uncertain acuity, consistent with acute pancreatic fluid collections or pseudocysts. Pt had a prior pancreatic mass with outpatient EUS performed 07/2021 with no malignancy and negative cytology.  Labs on admission  showed mildly elevated lipase but normal LFTs and amylase. -CTM  # Mild Hypokalemia # Metabolic alkalosis CMP showed K+ of 3.4, chloride 87 and bicarb 37.  Patient reports self-induced emesis multiple times to relieve epigastric pain after swallowing her pills. This is the likely cause of this abnormalities. -Replete K+ -IV fluid as above -Follow-up repeat BMP  # Anxiety and depression -Resume home Seroquel and mirtazapine  # Neuropathy -Gabapentin 900 mg at bedtime  # Tobacco use disorder Patient reports smoking 5 to 10 cigarettes a day. States she has been smoking since she was a teenager occasionally more occasionally less. Quit smoking after her hospitalization last year but resumed this year. She is not interested in nicotine patch at the moment but will let us know if she needs one.  -Counseled on smoking cessation  # Emphysema Patient has significant smoking history but no formal diagnosis of COPD. Imaging today shows evidence of emphysema. She has no respiratory symptoms and has no wheezing on exam. -As needed DuoNebs   Advance Care Planning:   Code Status: Full Code   Consults: Oncology  Family Communication: Discussed admission with friend at bedside  Severity of Illness: The appropriate patient status for this patient is INPATIENT. Inpatient status is judged to be reasonable and necessary in order to provide the required intensity of service to ensure the patient's safety. The patient's presenting symptoms, physical exam findings, and initial radiographic and laboratory data in the context of their chronic comorbidities is felt to place them at high risk for further clinical deterioration. Furthermore, it is not anticipated that the patient will be medically stable for discharge from the hospital within 2 midnights of admission.   * I certify that at the point of admission it is my clinical judgment that the patient will require inpatient hospital care spanning beyond 2  midnights from the point of admission due to high intensity of service, high risk for further deterioration and high frequency of surveillance required.*  Author: Steffanie Rainwater, MD 12/08/2022 7:46 PM  For on call review [email or web address].

## 2022-12-08 NOTE — ED Notes (Signed)
Pt ambulated to bathroom without need of assistance, attempted to provide urine sample but was unable to urinate at this time.

## 2022-12-09 ENCOUNTER — Inpatient Hospital Stay (HOSPITAL_COMMUNITY): Payer: Medicaid Other

## 2022-12-09 DIAGNOSIS — R1319 Other dysphagia: Secondary | ICD-10-CM

## 2022-12-09 DIAGNOSIS — K860 Alcohol-induced chronic pancreatitis: Secondary | ICD-10-CM | POA: Diagnosis not present

## 2022-12-09 DIAGNOSIS — R634 Abnormal weight loss: Secondary | ICD-10-CM | POA: Diagnosis not present

## 2022-12-09 DIAGNOSIS — K8689 Other specified diseases of pancreas: Secondary | ICD-10-CM | POA: Diagnosis not present

## 2022-12-09 DIAGNOSIS — K859 Acute pancreatitis without necrosis or infection, unspecified: Secondary | ICD-10-CM

## 2022-12-09 DIAGNOSIS — E46 Unspecified protein-calorie malnutrition: Secondary | ICD-10-CM | POA: Diagnosis not present

## 2022-12-09 DIAGNOSIS — E43 Unspecified severe protein-calorie malnutrition: Secondary | ICD-10-CM | POA: Insufficient documentation

## 2022-12-09 LAB — MAGNESIUM
Magnesium: 2.1 mg/dL (ref 1.7–2.4)
Magnesium: 2.1 mg/dL (ref 1.7–2.4)

## 2022-12-09 LAB — BASIC METABOLIC PANEL
Anion gap: 9 (ref 5–15)
BUN: 9 mg/dL (ref 8–23)
CO2: 33 mmol/L — ABNORMAL HIGH (ref 22–32)
Calcium: 8.3 mg/dL — ABNORMAL LOW (ref 8.9–10.3)
Chloride: 96 mmol/L — ABNORMAL LOW (ref 98–111)
Creatinine, Ser: 0.47 mg/dL (ref 0.44–1.00)
GFR, Estimated: >60 mL/min (ref >60)
Glucose, Bld: 114 mg/dL — ABNORMAL HIGH (ref 70–99)
Potassium: 3.1 mmol/L — ABNORMAL LOW (ref 3.5–5.1)
Sodium: 138 mmol/L (ref 135–145)

## 2022-12-09 LAB — COMPREHENSIVE METABOLIC PANEL
ALT: 12 U/L (ref 0–44)
AST: 18 U/L (ref 15–41)
Albumin: 2.5 g/dL — ABNORMAL LOW (ref 3.5–5.0)
Alkaline Phosphatase: 67 U/L (ref 38–126)
Anion gap: 9 (ref 5–15)
BUN: 10 mg/dL (ref 8–23)
CO2: 34 mmol/L — ABNORMAL HIGH (ref 22–32)
Calcium: 8 mg/dL — ABNORMAL LOW (ref 8.9–10.3)
Chloride: 94 mmol/L — ABNORMAL LOW (ref 98–111)
Creatinine, Ser: 0.55 mg/dL (ref 0.44–1.00)
GFR, Estimated: >60 mL/min (ref >60)
Glucose, Bld: 123 mg/dL — ABNORMAL HIGH (ref 70–99)
Potassium: 2.1 mmol/L — CL (ref 3.5–5.1)
Sodium: 137 mmol/L (ref 135–145)
Total Bilirubin: 0.3 mg/dL (ref <1.2)
Total Protein: 5.1 g/dL — ABNORMAL LOW (ref 6.5–8.1)

## 2022-12-09 LAB — GLUCOSE, CAPILLARY
Glucose-Capillary: 108 mg/dL — ABNORMAL HIGH (ref 70–99)
Glucose-Capillary: 117 mg/dL — ABNORMAL HIGH (ref 70–99)
Glucose-Capillary: 136 mg/dL — ABNORMAL HIGH (ref 70–99)
Glucose-Capillary: 98 mg/dL (ref 70–99)

## 2022-12-09 LAB — CBC
HCT: 37.4 % (ref 36.0–46.0)
Hemoglobin: 12.4 g/dL (ref 12.0–15.0)
MCH: 33.7 pg (ref 26.0–34.0)
MCHC: 33.2 g/dL (ref 30.0–36.0)
MCV: 101.6 fL — ABNORMAL HIGH (ref 80.0–100.0)
Platelets: 380 10*3/uL (ref 150–400)
RBC: 3.68 MIL/uL — ABNORMAL LOW (ref 3.87–5.11)
RDW: 13 % (ref 11.5–15.5)
WBC: 7.5 10*3/uL (ref 4.0–10.5)
nRBC: 0 % (ref 0.0–0.2)

## 2022-12-09 MED ORDER — GADOBUTROL 1 MMOL/ML IV SOLN
5.0000 mL | Freq: Once | INTRAVENOUS | Status: AC | PRN
  Administered 2022-12-09: 5 mL via INTRAVENOUS

## 2022-12-09 MED ORDER — FAMOTIDINE IN NACL 20-0.9 MG/50ML-% IV SOLN
20.0000 mg | Freq: Once | INTRAVENOUS | Status: AC
  Administered 2022-12-09: 20 mg via INTRAVENOUS
  Filled 2022-12-09: qty 50

## 2022-12-09 MED ORDER — ALUM & MAG HYDROXIDE-SIMETH 200-200-20 MG/5ML PO SUSP
30.0000 mL | Freq: Three times a day (TID) | ORAL | Status: DC | PRN
  Administered 2022-12-09 – 2022-12-11 (×3): 30 mL via ORAL
  Filled 2022-12-09 (×3): qty 30

## 2022-12-09 MED ORDER — POTASSIUM CHLORIDE 10 MEQ/100ML IV SOLN: 10.0000 meq | INTRAVENOUS | Status: DC

## 2022-12-09 MED ORDER — POTASSIUM CHLORIDE 20 MEQ PO PACK
40.0000 meq | PACK | ORAL | Status: AC
  Administered 2022-12-09 (×3): 40 meq via ORAL
  Filled 2022-12-09 (×3): qty 2

## 2022-12-09 MED ORDER — SODIUM CHLORIDE 0.9 % IV SOLN: INTRAVENOUS | Status: AC

## 2022-12-09 MED ORDER — PANTOPRAZOLE SODIUM 40 MG PO TBEC
40.0000 mg | DELAYED_RELEASE_TABLET | Freq: Every day | ORAL | Status: DC
  Administered 2022-12-09 – 2022-12-10 (×2): 40 mg via ORAL
  Filled 2022-12-09 (×2): qty 1

## 2022-12-09 NOTE — Consult Note (Addendum)
Consultation  Referring Provider:   Dr. Alanda Slim   Primary Care Physician:  Allwardt, Crist Infante, PA-C Primary Gastroenterologist: Dr. Marina Goodell    Reason for Consultation: Pancreatic Mass             HPI:   Kathryn Underwood is a 62 y.o. female with a past medical history significant for anxiety, depression, GERD, neuropathy, alcoholic pancreatitis, pancreatic pseudocyst and alcohol abuse who presented to the [facility] ER due to decreased appetite over the past few months with associated weight loss.    At admission patient reported weighing 122 pounds in July 2023 but over the past 2 months she had noticed a drastic weight loss with her weight now 110 pounds.  Discussed a decreased appetite as well as fatigue over the past 2 months and feeling like things are getting stuck in her chest when she was taking a pill.  Also occasional black stools.    Today, patient tells me that she had been doing well over the past year or so, really just some mild generalized abdominal bloating ever since her workup for pancreatitis in the past.  She had discontinued alcohol use and felt good.  Most recently over the past 2 to 3 months noticed that she increasingly could not keep anything on her stomach without severe epigastric pain.  She was having to make herself vomit things such as meats in order to feel better.  Most recently only able to tolerate macaroni and cheese and mashed potatoes.  Also describes using Advil sometimes 2 tabs 3 times a day for this pain and notes some black tarry sticky stools on occasion over the past couple of months.  These were interspersed with regular brown stools.  Also tells me she had some dysphagia to pills noting that she would swallow them they seem to get stuck and she would have to vomit them up in order for them not to cause pain.  She was growing increasingly fatigued and weak which brought her to the hospital.  Associated symptoms include a weight loss of over 30 pounds over the  past year per patient.    Denies fever, chills or symptoms that awaken her from sleep.  ER course: Tachycardic to the 100s with mild hypotension, CT of the chest abdomen and pelvis done for dysphagia, decreasing appetite and weight loss with very extensive, expansile soft tissue, fat stranding and cystic change throughout the pancreatic head, encasing multiple adjacent structures including the portal vein and central mesenteric vessels, severe intrahepatic biliary ductal dilation with a distended gallbladder, portal vein effaced and occluded, multiple peripancreatic fluid collections including a collection overlying the pancreatic neck and body measuring 6.9 x 5.3 cm, discussed highly concerning for pancreatic adenocarcinoma complicated by pancreatitis; CBC with a normal hemoglobin and platelets, potassium low at 2.1, albumin low at 2.5  GI history: 08/14/2021 EUS with Dr. Christella Hartigan: No sign of pancreatic mass or chronic pancreatitis 09/12/2021 patient seen in clinic for follow-up after recent prolonged hospitalization for alcoholic pancreatitis, aspiration of fluid by IR was negative for malignancy, outpatient EUS with aspiration of one of the fluid collections was performed with no evidence of pancreatic mass or chronic pancreatitis, CEA of 33, amylase 1294, cytology negative, at that time doing well  Past Medical History:  Diagnosis Date   Alcohol abuse    Anxiety    Chickenpox    Depression    Neuromuscular disorder (HCC)    neuropathy   Substance abuse (HCC)  Past Surgical History:  Procedure Laterality Date   CESAREAN SECTION     ESOPHAGOGASTRODUODENOSCOPY N/A 08/14/2021   Procedure: ESOPHAGOGASTRODUODENOSCOPY (EGD);  Surgeon: Rachael Fee, MD;  Location: [facility];  Service: Gastroenterology;  Laterality: N/A;   EUS N/A 08/14/2021   Procedure: UPPER ENDOSCOPIC ULTRASOUND (EUS) RADIAL;  Surgeon: Rachael Fee, MD;  Location: [facility];  Service: Gastroenterology;  Laterality:  N/A;   FINE NEEDLE ASPIRATION N/A 08/14/2021   Procedure: FINE NEEDLE ASPIRATION (FNA) LINEAR;  Surgeon: Rachael Fee, MD;  Location: [facility];  Service: Gastroenterology;  Laterality: N/A;   TONSILLECTOMY      Family History  Problem Relation Age of Onset   Dementia Mother    Arthritis Father    Asthma Father    Asthma Brother    Diabetes Maternal Grandmother    Dementia Maternal Grandmother    Diabetes Paternal Grandmother    Parkinson's disease Maternal Grandfather    Parkinson's disease Paternal Grandfather    Breast cancer Neg Hx    Colon cancer Neg Hx     Social History   Tobacco Use   Smoking status: Some Days    Current packs/day: 0.50    Types: Cigarettes   Smokeless tobacco: Never  Vaping Use   Vaping status: Never Used  Substance Use Topics   Alcohol use: Not Currently    Alcohol/week: 16.0 standard drinks of alcohol    Types: 16 Glasses of wine per week    Comment: daily (1.5 liter bottle wine every 12)   Drug use: Not Currently    Types: Cocaine, Marijuana    Prior to Admission medications   Medication Sig Start Date End Date Taking? Authorizing Provider  gabapentin (NEURONTIN) 300 MG capsule Take 2 capsules (600 mg total) by mouth 2 (two) times daily. Patient taking differently: Take 900 mg by mouth at bedtime. 10/09/17  Yes Rolan Bucco, MD  mirtazapine (REMERON) 45 MG tablet Take 45 mg by mouth at bedtime. 11/05/21  Yes [provider]  QUEtiapine (SEROQUEL) 100 MG tablet Take 100-200 mg by mouth at bedtime.   Yes [provider]  albuterol (VENTOLIN HFA) 108 (90 Base) MCG/ACT inhaler Inhale 2 puffs into the lungs every 6 (six) hours as needed for wheezing or shortness of breath. Patient not taking: Reported on 12/08/2022 08/06/21   Burnadette Pop, MD  folic acid (FOLVITE) 1 MG tablet Take 1 tablet (1 mg total) by mouth daily. Patient not taking: Reported on 12/08/2022 08/07/21   Burnadette Pop, MD  mirtazapine (REMERON) 15 MG  tablet Take 1 tablet (15 mg total) by mouth at bedtime. Patient not taking: Reported on 12/08/2022 08/06/21   Burnadette Pop, MD  mometasone-formoterol (DULERA) 200-5 MCG/ACT AERO Inhale 2 puffs into the lungs 2 (two) times daily. Patient not taking: Reported on 12/08/2022 08/06/21   Burnadette Pop, MD  QUEtiapine (SEROQUEL) 25 MG tablet Take 1 tablet (25 mg total) by mouth at bedtime. Patient not taking: Reported on 12/08/2022 08/06/21   Burnadette Pop, MD  thiamine 100 MG tablet Take 1 tablet (100 mg total) by mouth daily. Patient not taking: Reported on 09/12/2021 08/07/21   Burnadette Pop, MD    Current Facility-Administered Medications  Medication Dose Route Frequency Provider Last Rate Last Admin   acetaminophen (TYLENOL) tablet 650 mg  650 mg Oral Q6H PRN Steffanie Rainwater, MD       Or   acetaminophen (TYLENOL) suppository 650 mg  650 mg Rectal Q6H PRN Steffanie Rainwater, MD  dextrose 5 % and 0.45 % NaCl infusion   Intravenous Continuous Steffanie Rainwater, MD 100 mL/hr at 12/09/22 0332 Infusion Verify at 12/09/22 0332   enoxaparin (LOVENOX) injection 40 mg  40 mg Subcutaneous Q24H Steffanie Rainwater, MD   40 mg at 12/08/22 2121   feeding supplement (BOOST / RESOURCE BREEZE) liquid 1 Container  1 Container Oral TID BM Steffanie Rainwater, MD       gabapentin (NEURONTIN) capsule 900 mg  900 mg Oral QHS Steffanie Rainwater, MD   900 mg at 12/08/22 2119   ipratropium-albuterol (DUONEB) 0.5-2.5 (3) MG/3ML nebulizer solution 3 mL  3 mL Nebulization Q6H PRN Steffanie Rainwater, MD       mirtazapine (REMERON) tablet 45 mg  45 mg Oral QHS Steffanie Rainwater, MD   45 mg at 12/08/22 2119   ondansetron (ZOFRAN) tablet 4 mg  4 mg Oral Q6H PRN Steffanie Rainwater, MD       Or   ondansetron [facility]) injection 4 mg  4 mg Intravenous Q6H PRN Steffanie Rainwater, MD       potassium chloride 10 mEq in 100 mL IVPB  10 mEq Intravenous Q1 Hr x 6 Luiz Iron, NP       QUEtiapine (SEROQUEL)  tablet 100-200 mg  100-200 mg Oral QHS Steffanie Rainwater, MD   200 mg at 12/08/22 2118   senna-docusate (Senokot-S) tablet 1 tablet  1 tablet Oral QHS PRN Steffanie Rainwater, MD        Allergies as of 12/08/2022   (No Known Allergies)     Review of Systems:    Constitutional: No fever or chills Skin: No rash  Cardiovascular: No chest pain  Respiratory: No SOB Gastrointestinal: See HPI and otherwise negative Genitourinary: No dysuria  Neurological: No headache, dizziness or syncope Musculoskeletal: No new muscle or joint pain Hematologic: No bleeding Psychiatric: No history of depression or anxiety    Physical Exam:  Vital signs in last 24 hours: Temp:  [98.1 F (36.7 C)-99.1 F (37.3 C)] 98.4 F (36.9 C) (11/13 0448) Pulse Rate:  [87-106] 87 (11/13 0448) Resp:  [13-20] 18 (11/13 0448) BP: (83-103)/(52-70) 94/52 (11/13 0448) SpO2:  [90 %-95 %] 93 % (11/13 0448) Weight:  [49.9 kg] 49.9 kg (11/12 1216)   General:   Pleasant thin appearing Caucasian female appears to be in NAD, Well developed, alert and cooperative Head:  Normocephalic and atraumatic. Eyes:   PEERL, EOMI. No icterus. Conjunctiva pink. Ears:  Normal auditory acuity. Neck:  Supple Throat: Oral cavity and pharynx without inflammation, swelling or lesion. Teeth in good condition. Lungs: Respirations even and unlabored. Lungs clear to auscultation bilaterally.   No wheezes, crackles, or rhonchi.  Heart: Normal S1, S2. No MRG. Regular rate and rhythm. No peripheral edema, cyanosis or pallor.  Abdomen:  Soft, nondistended, nontender. No rebound or guarding. Normal bowel sounds. No appreciable masses or hepatomegaly. Rectal:  Not performed.  Msk:  Symmetrical without gross deformities. Peripheral pulses intact.  Extremities:  Without edema, no deformity or joint abnormality. Normal ROM, normal sensation. Neurologic:  Alert and  oriented x4;  grossly normal neurologically.  Skin:   Dry and intact without  significant lesions or rashes. Psychiatric: Demonstrates good judgement and reason without abnormal affect or behaviors.   LAB RESULTS: Recent Labs    12/08/22 1236 12/09/22 0531  WBC 10.1 7.5  HGB 15.2* 12.4  HCT 45.3 37.4  PLT 424* 380   BMET Recent Labs  12/08/22 1236 12/09/22 0531  NA 135 137  K 3.4* 2.1*  CL 87* 94*  CO2 37* 34*  GLUCOSE 131* 123*  BUN 11 10  CREATININE 0.60 0.55  CALCIUM 9.0 8.0*   LFT Recent Labs    12/08/22 1231 12/09/22 0531  PROT 7.0 5.1*  ALBUMIN 3.5 2.5*  AST 26 18  ALT 9 12  ALKPHOS 88 67  BILITOT 0.6 0.3  BILIDIR 0.1  --   IBILI 0.5  --    Lipase     Component Value Date/Time   LIPASE 57 (H) 12/08/2022 1231     STUDIES: CT CHEST ABDOMEN PELVIS W CONTRAST  Result Date: 12/08/2022 CLINICAL DATA:  Dysphagia, decreasing appetite and weight loss * Tracking Code: BO * EXAM: CT CHEST, ABDOMEN, AND PELVIS WITH CONTRAST TECHNIQUE: Multidetector CT imaging of the chest, abdomen and pelvis was performed following the standard protocol during bolus administration of intravenous contrast. RADIATION DOSE REDUCTION: This exam was performed according to the departmental dose-optimization program which includes automated exposure control, adjustment of the mA and/or kV according to patient size and/or use of iterative reconstruction technique. CONTRAST:  75mL OMNIPAQUE IOHEXOL 300 MG/ML  SOLN COMPARISON:  CT chest, 07/24/2021, abdomen pelvis, 07/27/2021 FINDINGS: CT CHEST FINDINGS Cardiovascular: Scattered aortic atherosclerosis. Normal heart size. Left coronary artery calcifications. Small pericardial effusion. Mediastinum/Nodes: No enlarged mediastinal, hilar, or axillary lymph nodes. Thyroid gland, trachea, and esophagus demonstrate no significant findings. Lungs/Pleura: Diminished ground-glass opacity of the medial left apex measuring 0.7 cm, previously 1.0 cm, nonspecific and infectious or inflammatory requiring no specific further follow-up  (series 4, image 37). Mild centrilobular emphysema. Benign, bandlike scarring of the left lung base. No pleural effusion or pneumothorax. Musculoskeletal: No chest wall abnormality. No acute osseous findings. CT ABDOMEN PELVIS FINDINGS Hepatobiliary: Heterogeneous perfusion of the liver. Severe intra hepatic biliary ductal dilatation with a distended gallbladder. 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 Partial cavernous transformation. Pancreas: Very extensive, expansile soft tissue, fat stranding, and cystic change throughout the pancreatic head, encasing multiple adjacent structures including the portal vein and central mesenteric vessels, the superior mesenteric artery, and portions of the descending duodenum (series 2, image 66). Maximum apparent cross-section of the pancreatic head is 6.4 x 5.3 cm (series 2, image 71). Severe atrophy of the distal pancreatic parenchyma as well as diffuse ductal dilatation distally. Multiple fluid collections, including a collection overlying the pancreatic neck and body measuring 6.9 x 5.3 cm (series 2, image 69), and extending inferiorly from the lateral pancreatic head measuring 4.0 x 3.5 cm (series 2, image 77). Spleen: Normal in size without significant abnormality. Adrenals/Urinary Tract: Adenomatous thickening of the adrenal glands, requiring no specific further follow-up or characterization. Kidneys are normal, without renal calculi, solid lesion, or hydronephrosis. Bladder is unremarkable. Stomach/Bowel: Stomach is within normal limits. Appendix appears normal. No evidence of bowel wall thickening, distention, or inflammatory changes. Vascular/Lymphatic: Aortic atherosclerosis. No enlarged abdominal or pelvic lymph nodes. Reproductive: Calcified uterine fibroids. Other: No abdominal wall hernia or  abnormality. No ascites. Musculoskeletal: No acute osseous findings. IMPRESSION: 1. Very extensive, expansile soft tissue, fat stranding, and cystic change throughout the pancreatic head, encasing multiple adjacent structures including the portal vein and central mesenteric vessels, the superior mesenteric artery, and portions of the descending duodenum. Maximum apparent cross-section of the pancreatic head is 6.4  x 5.3 cm. 2. Severe intrahepatic biliary ductal dilatation with a distended gallbladder. 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 3. Multiple peripancreatic fluid collections, including a collection overlying the pancreatic neck and body measuring 6.9 x 5.3 cm, and extending inferiorly from the lateral pancreatic head measuring 4.0 x 3.5 cm, of uncertain acuity, consistent with acute pancreatic fluid collections or pseudocysts. The presence or absence of infection within this fluid is not established by imaging. 4. Constellation of findings is of uncertain significance, highly concerning for pancreatic adenocarcinoma complicated by pancreatitis, however at least conceivably this could reflect benign, although unusually severe acute on chronic pancreatitis. 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 5. No evidence of lymphadenopathy or metastatic disease in the chest, abdomen, or pelvis. 6. Emphysema. 7. Coronary artery disease. Aortic Atherosclerosis (ICD10-I70.0) and Emphysema (ICD10-J43.9). Electronically Signed   By: Jearld Lesch M.D.   On: 12/08/2022 14:56     Impression / Plan:   Impression: 1.  Abnormal CT of the pancreas: As above showing  new pancreatic mass and acute on chronic pancreatitis with multiple pseudocysts, previous evaluation with EUS in July of last year with no sign of cancer, MRCP pending, LFTs normal; consider benign process versus pancreatic cancer 2.  Weight loss and fatigue: 30 pounds per patient over the past year 3.  History of alcoholic pancreatitis: No current alcohol use, normal LFTs 4.  Hypokalemia/metabolic alkalosis: Needs correction prior to procedure 5.  Anxiety and depression 6.  Emphysema  Plan: 1.  Will discuss EUS with FNA with Dr. Meridee Score.  Did discuss this with the patient.  She is willing to proceed if needed. 2.  For now can have a clear liquid diet and will be n.p.o. at midnight just in case 3.  Agree with fluids and pain meds. 4.  Will await results from MRI this morning 5.  Needs correction of potassium, this has been ordered by the hospitalist team today Will recheck today at 1700. 6. Ordered CA-19-9  Thank you for your kind consultation, we will continue to follow.  Violet Baldy [facility]  12/09/2022, 8:56 AM

## 2022-12-09 NOTE — Progress Notes (Signed)
Date and time results received: 12/09/22 0649   Test: Potassium Critical Value: 2.1  Name of Provider Notified: Chinita Greenland NP  Orders Received? Or Actions Taken?: See new orders

## 2022-12-09 NOTE — Plan of Care (Signed)
  Problem: Education: Goal: Knowledge of General Education information will improve Description: Including pain rating scale, medication(s)/side effects and non-pharmacologic comfort measures 12/09/2022 1850 by Genevie Ann, RN Outcome: Progressing 12/09/2022 1849 by Genevie Ann, RN Outcome: Progressing

## 2022-12-09 NOTE — Progress Notes (Signed)
Initial Nutrition Assessment  DOCUMENTATION CODES:   Severe malnutrition in context of chronic illness  INTERVENTION:   Monitor magnesium, potassium, and phosphorus for at least 3 days, MD to replete as needed, as pt is at risk for refeeding syndrome.  -Boost Breeze po TID, each supplement provides 250 kcal and 9 grams of protein  Once diet advanced:  -Multivitamin with minerals daily -Ensure MAX Protein po BID, each supplement provides 150 kcal and 30 grams of protein    NUTRITION DIAGNOSIS:   Severe Malnutrition related to chronic illness (pancreatitis) as evidenced by energy intake < or equal to 75% for > or equal to 1 month, severe fat depletion, moderate muscle depletion, percent weight loss.  GOAL:   Patient will meet greater than or equal to 90% of their needs  MONITOR:   PO intake, Supplement acceptance, Labs, Weight trends, Diet advancement, I & O's  REASON FOR ASSESSMENT:   Consult Assessment of nutrition requirement/status  ASSESSMENT:   62 y.o. lady with history of alcohol abuse, alcoholic pancreatitis, GERD, anxiety, depression, nicotine dependence, was admitted to the hospital overnight after she presented to [facility] ED with complaints of decreased appetite over the last few months, unintentional weight loss, fatigue.  CT chest, abdomen and pelvis showed new mass in the head of the pancreas, concerning for possible pancreatic adenocarcinoma, complicated by pancreatitis.  Patient in room, daughter at bedside. Pt eating an icee and states she is tolerating clears today. Willing to try [employer]. Reports she has been hungry just unable to keep food down since the end of September. Pt has been subsisting on mashed potatoes, pasta, mac and cheese and jello. Tried Ensure but this didn't go well. At times had to vomit food back up to get relief of symptoms. Has not drank alcohol in a year. Pills had been getting stuck in her throat.  Pt to be NPO tomorrow for  EUS and biopsy.   Per weight records, pt has lost 37 lbs since August 2023 (25% wt loss x 1.25 years, significant for time frame).  Medications: Remeron, KLOR-CON  Labs reviewed: CBGs: 105-136 Low K   NUTRITION - FOCUSED PHYSICAL EXAM:  Flowsheet Row Most Recent Value  Orbital Region Moderate depletion  Upper Arm Region Severe depletion  Thoracic and Lumbar Region Severe depletion  Buccal Region Severe depletion  Temple Region Moderate depletion  Clavicle Bone Region Moderate depletion  Clavicle and Acromion Bone Region Moderate depletion  Scapular Bone Region Moderate depletion  Dorsal Hand Severe depletion  Patellar Region Unable to assess  Anterior Thigh Region Unable to assess  Posterior Calf Region Unable to assess  Edema (RD Assessment) None  Hair Reviewed  Eyes Reviewed  Mouth Reviewed  Skin Reviewed       Diet Order:   Diet Order             Diet NPO time specified Except for: Sips with Meds, Ice Chips  Diet effective midnight           Diet clear liquid Fluid consistency: Thin  Diet effective now                   EDUCATION NEEDS:   Education needs have been addressed  Skin:  Skin Assessment: Reviewed RN Assessment  Last BM:  PTA  Height:   Ht Readings from Last 1 Encounters:  12/08/22 5\' 3"  (1.6 m)    Weight:   Wt Readings from Last 1 Encounters:  12/08/22 49.9 kg  BMI:  Body mass index is 19.49 kg/m.  Estimated Nutritional Needs:   Kcal:  1550-1750  Protein:  75-90g  Fluid:  1.8L/day   Tilda Franco, MS, RD, LDN Inpatient Clinical Dietitian Contact information available via Amion

## 2022-12-09 NOTE — Plan of Care (Signed)
  Problem: Education: Goal: Knowledge of General Education information will improve Description: Including pain rating scale, medication(s)/side effects and non-pharmacologic comfort measures Outcome: Progressing   Problem: Health Behavior/Discharge Planning: Goal: Ability to manage health-related needs will improve Outcome: Progressing   Problem: Clinical Measurements: Goal: Ability to maintain clinical measurements within normal limits will improve Outcome: Progressing Goal: Will remain free from infection Outcome: Progressing Goal: Diagnostic test results will improve Outcome: Progressing Goal: Respiratory complications will improve Outcome: Progressing Goal: Cardiovascular complication will be avoided Outcome: Progressing   Problem: Activity: Goal: Risk for activity intolerance will decrease Outcome: Progressing   Problem: Nutrition: Goal: Adequate nutrition will be maintained Outcome: Progressing   Problem: Elimination: Goal: Will not experience complications related to bowel motility Outcome: Progressing Goal: Will not experience complications related to urinary retention Outcome: Progressing   Problem: Pain Management: Goal: General experience of comfort will improve Outcome: Progressing   Problem: Safety: Goal: Ability to remain free from injury will improve Outcome: Progressing   Problem: Skin Integrity: Goal: Risk for impaired skin integrity will decrease Outcome: Progressing

## 2022-12-09 NOTE — Progress Notes (Signed)
PROGRESS NOTE  Kathryn Underwood WGN:[id number] DOB: Jan 19, 1961   PCP: Bary Leriche, PA-C  Patient is from: Home.  DOA: 12/08/2022 LOS: 1  Chief complaints Chief Complaint  Patient presents with   Weight Loss   Weakness     Brief Narrative / Interim history: 62 year old F with PMH of EtOH pancreatitis with pseudocyst, alcohol use disorder, neuropathy, anxiety, depression, GERD and tobacco use disorder presenting with decreased oral intake, fatigue and unintentional weight loss for about 2 months and found to have pancreatic mass on imaging.  CT chest, abdomen and pelvis showed Very extensive, expansile soft tissue, fat stranding, and cystic change throughout the pancreatic head, encasing multiple adjacent structures including the portal vein and central mesenteric vessels, the superior mesenteric artery, and portions of the descending duodenum, and severe intrahepatic biliary ductal dilation with distended gallbladder and multiple peripancreatic fluid collections.  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  GI and oncology consulted.  MRCP ordered.   Subjective: Seen and examined earlier this morning.  No major events overnight of this morning.  Feels hungry and likes to eat but she understands the need to wait on GI evaluation and MRCP results.  Also reports difficulty tolerating IV potassium.  Asking if she can be given oral potassium.  Objective: Vitals:   12/08/22 1415 12/08/22 1753 12/08/22 2144 12/09/22 0448  BP: 93/66 103/64 101/68 (!) 94/52  Pulse: 98 99 96 87  Resp: 15  18 18   Temp: 98.1 F (36.7 C) 98.3 F (36.8 C) 99.1 F (37.3 C) 98.4 F (36.9 C)  TempSrc: Oral Oral Oral Oral  SpO2: 90% 90% 91% 93%  Weight:      Height:        Examination:  GENERAL: No apparent distress.  Nontoxic. HEENT: MMM.  Vision and hearing grossly intact.  NECK: Supple.  No apparent JVD.  RESP:  No IWOB.  Fair aeration  bilaterally. CVS:  RRR. Heart sounds normal.  ABD/GI/GU: BS+. Abd soft, NTND.  MSK/EXT:  Moves extremities. No apparent deformity. No edema.  SKIN: no apparent skin lesion or wound NEURO: Awake, alert and oriented appropriately.  No apparent focal neuro deficit. PSYCH: Calm. Normal affect.   Procedures:  None  Microbiology summarized: None  Assessment and plan: New pancreatic head mass: Patient presented with unintentional weight loss, poor p.o. intake and fatigue.  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  Patient has no significant abdominal pain or tenderness on exam.  No nausea or vomiting. -GI and oncology consulted and following -Follow MRCP -Started on clear liquid diet by GI -Follow CA 19-9   Hx alcoholic pancreatitis with pseudocyst: CT abdomen and pelvis as above.  Patient without significant abdominal pain, nausea or vomiting.  No significant elevation of lipase or LFT. -Follow MRCP  Profound hypokalemia: K2.1.  Mg 2.1. -P.o. KCl 40 x 3 -Recheck K and Mg in the afternoon  Metabolic alkalosis: Patient reports self-induced emesis multiple times to relieve epigastric pain after swallowing her pills. This is the likely cause of this abnormalities. -Replete K+ and recheck.   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 -Counseled on smoking cessation -Not interested in nicotine patch   Emphysema: No respiratory symptoms -Encouraged smoking cessation -As needed DuoNebs  Unintentional weight loss/poor p.o. intake.  Likely due to #1 Body mass  index is 19.49 kg/m. -Consult dietitian          DVT prophylaxis:  enoxaparin (LOVENOX) injection 40 mg Start: 12/08/22 2200  Code Status: Full code Family Communication: None at bedside Level of care: Telemetry Status is:  Inpatient Remains inpatient appropriate because: Pancreatic head mass concerning for adenocarcinoma, possible pancreatitis and electrolyte arrangement   Final disposition: Likely home Consultants:  Gastroenterology Oncology  55 minutes with more than 50% spent in reviewing records, counseling patient/family and coordinating care.   Sch Meds:  Scheduled Meds:  enoxaparin (LOVENOX) injection  40 mg Subcutaneous Q24H   feeding supplement  1 Container Oral TID BM   gabapentin  900 mg Oral QHS   mirtazapine  45 mg Oral QHS   potassium chloride  40 mEq Oral Q3H   QUEtiapine  100-200 mg Oral QHS   Continuous Infusions:  dextrose 5 % and 0.45 % NaCl 100 mL/hr at 12/09/22 0332   PRN Meds:.acetaminophen **OR** acetaminophen, ipratropium-albuterol, ondansetron **OR** ondansetron (ZOFRAN) IV, senna-docusate  Antimicrobials: Anti-infectives (From admission, onward)    None        I have personally reviewed the following labs and images: CBC: Recent Labs  Lab 12/08/22 1236 12/09/22 0531  WBC 10.1 7.5  HGB 15.2* 12.4  HCT 45.3 37.4  MCV 98.9 101.6*  PLT 424* 380   BMP &GFR Recent Labs  Lab 12/08/22 1236 12/09/22 0531  NA 135 137  K 3.4* 2.1*  CL 87* 94*  CO2 37* 34*  GLUCOSE 131* 123*  BUN 11 10  CREATININE 0.60 0.55  CALCIUM 9.0 8.0*  MG  --  2.1   Estimated Creatinine Clearance: 56.7 mL/min (by C-G formula based on SCr of 0.55 mg/dL). Liver & Pancreas: Recent Labs  Lab 12/08/22 1231 12/09/22 0531  AST 26 18  ALT 9 12  ALKPHOS 88 67  BILITOT 0.6 0.3  PROT 7.0 5.1*  ALBUMIN 3.5 2.5*   Recent Labs  Lab 12/08/22 1231  LIPASE 57*  AMYLASE 42   No results for input(s): "AMMONIA" in the last 168 hours. Diabetic: No results for input(s): "HGBA1C" in the last 72 hours. Recent Labs  Lab 12/08/22 1231 12/09/22 0046 12/09/22 0607 12/09/22 1151  GLUCAP 105* 136* 108* 117*   Cardiac Enzymes: No results for input(s): "CKTOTAL", "CKMB", "CKMBINDEX",  "TROPONINI" in the last 168 hours. No results for input(s): "PROBNP" in the last 8760 hours. Coagulation Profile: No results for input(s): "INR", "PROTIME" in the last 168 hours. Thyroid Function Tests: No results for input(s): "TSH", "T4TOTAL", "FREET4", "T3FREE", "THYROIDAB" in the last 72 hours. Lipid Profile: No results for input(s): "CHOL", "HDL", "LDLCALC", "TRIG", "CHOLHDL", "LDLDIRECT" in the last 72 hours. Anemia Panel: No results for input(s): "VITAMINB12", "FOLATE", "FERRITIN", "TIBC", "IRON", "RETICCTPCT" in the last 72 hours. Urine analysis:    Component Value Date/Time   COLORURINE YELLOW 07/24/2021 0827   APPEARANCEUR HAZY (A) 07/24/2021 0827   LABSPEC 1.019 07/24/2021 0827   PHURINE 5.0 07/24/2021 0827   GLUCOSEU NEGATIVE 07/24/2021 0827   HGBUR NEGATIVE 07/24/2021 0827   BILIRUBINUR NEGATIVE 07/24/2021 0827   KETONESUR NEGATIVE 07/24/2021 0827   PROTEINUR TRACE (A) 07/24/2021 0827   UROBILINOGEN 0.2 01/23/2010 1750   NITRITE NEGATIVE 07/24/2021 0827   LEUKOCYTESUR NEGATIVE 07/24/2021 0827   Sepsis Labs: Invalid input(s): "PROCALCITONIN", "LACTICIDVEN"  Microbiology: No results found for this or any previous visit (from the past 240 hour(s)).  Radiology Studies: CT CHEST ABDOMEN PELVIS W CONTRAST  Result Date: 12/08/2022 CLINICAL DATA:  Dysphagia,  decreasing appetite and weight loss * Tracking Code: BO * EXAM: CT CHEST, ABDOMEN, AND PELVIS WITH CONTRAST TECHNIQUE: Multidetector CT imaging of the chest, abdomen and pelvis was performed following the standard protocol during bolus administration of intravenous contrast. RADIATION DOSE REDUCTION: This exam was performed according to the departmental dose-optimization program which includes automated exposure control, adjustment of the mA and/or kV according to patient size and/or use of iterative reconstruction technique. CONTRAST:  75mL OMNIPAQUE IOHEXOL 300 MG/ML  SOLN COMPARISON:  CT chest, 07/24/2021, abdomen  pelvis, 07/27/2021 FINDINGS: CT CHEST FINDINGS Cardiovascular: Scattered aortic atherosclerosis. Normal heart size. Left coronary artery calcifications. Small pericardial effusion. Mediastinum/Nodes: No enlarged mediastinal, hilar, or axillary lymph nodes. Thyroid gland, trachea, and esophagus demonstrate no significant findings. Lungs/Pleura: Diminished ground-glass opacity of the medial left apex measuring 0.7 cm, previously 1.0 cm, nonspecific and infectious or inflammatory requiring no specific further follow-up (series 4, image 37). Mild centrilobular emphysema. Benign, bandlike scarring of the left lung base. No pleural effusion or pneumothorax. Musculoskeletal: No chest wall abnormality. No acute osseous findings. CT ABDOMEN PELVIS FINDINGS Hepatobiliary: Heterogeneous perfusion of the liver. Severe intra hepatic biliary ductal dilatation with a distended gallbladder. 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 Partial cavernous transformation. Pancreas: Very extensive, expansile soft tissue, fat stranding, and cystic change throughout the pancreatic head, encasing multiple adjacent structures including the portal vein and central mesenteric vessels, the superior mesenteric artery, and portions of the descending duodenum (series 2, image 66). Maximum apparent cross-section of the pancreatic head is 6.4 x 5.3 cm (series 2, image 71). Severe atrophy of the distal pancreatic parenchyma as well as diffuse ductal dilatation distally. Multiple fluid collections, including a collection overlying the pancreatic neck and body measuring 6.9 x 5.3 cm (series 2, image 69), and extending inferiorly from the lateral pancreatic head measuring 4.0 x 3.5 cm (series 2, image 77). Spleen: Normal in size without significant abnormality. Adrenals/Urinary Tract:  Adenomatous thickening of the adrenal glands, requiring no specific further follow-up or characterization. Kidneys are normal, without renal calculi, solid lesion, or hydronephrosis. Bladder is unremarkable. Stomach/Bowel: Stomach is within normal limits. Appendix appears normal. No evidence of bowel wall thickening, distention, or inflammatory changes. Vascular/Lymphatic: Aortic atherosclerosis. No enlarged abdominal or pelvic lymph nodes. Reproductive: Calcified uterine fibroids. Other: No abdominal wall hernia or abnormality. No ascites. Musculoskeletal: No acute osseous findings. IMPRESSION: 1. Very extensive, expansile soft tissue, fat stranding, and cystic change throughout the pancreatic head, encasing multiple adjacent structures including the portal vein and central mesenteric vessels, the superior mesenteric artery, and portions of the descending duodenum. Maximum apparent cross-section of the pancreatic head is 6.4 x 5.3 cm. 2. Severe intrahepatic biliary ductal dilatation with a distended gallbladder. 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 3. Multiple peripancreatic fluid collections, including a collection overlying the pancreatic neck and body measuring 6.9 x 5.3 cm, and extending inferiorly from the lateral pancreatic head measuring 4.0 x 3.5 cm, of uncertain acuity, consistent with acute pancreatic fluid collections or pseudocysts. The presence or absence of infection within this fluid is not established by imaging. 4. Constellation of findings is of uncertain significance, highly concerning for pancreatic adenocarcinoma  complicated by pancreatitis, however at least conceivably this could reflect benign, although unusually severe acute on chronic pancreatitis. Although patient has an established imaging history  of pancreatitis complicated by pseudocysts, findings within the pancreatic head are completely new when compared to relatively recent examinations dated 2023. 5. No evidence of lymphadenopathy or metastatic disease in the chest, abdomen, or pelvis. 6. Emphysema. 7. Coronary artery disease. Aortic Atherosclerosis (ICD10-I70.0) and Emphysema (ICD10-J43.9). Electronically Signed   By: Jearld Lesch M.D.   On: 12/08/2022 14:56      Carmie Lanpher T. Shade Kaley [facility]  If 7PM-7AM, please contact night-coverage [email or web address] 12/09/2022, 12:55 PM

## 2022-12-09 NOTE — Consult Note (Signed)
[facility]  [phone]   HEMATOLOGY/ONCOLOGY IN-PATIENT CONSULTATION NOTE   PATIENT NAME: Kathryn Underwood   MR#: [id number] DOB: 11-27-1960 CSN#: [id number]   DATE OF SERVICE: 12/09/2022  Requesting Physician: [facility]   Patient Care Team: Allwardt, Crist Infante, PA-C as PCP - General (Physician Assistant) Geraldine Contras, MD as Referring Physician (Psychiatry)  REASON FOR CONSULTATION:  Pancreatic mass, suspicious for pancreatic malignancy.  HISTORY OF PRESENT ILLNESS  Kathryn Underwood is a 62 y.o. lady with history of alcohol abuse, alcoholic pancreatitis, GERD, anxiety, depression, nicotine dependence, was admitted to the hospital overnight after she presented to the [facility] ED with complaints of decreased appetite over the last few months, unintentional weight loss, fatigue.  CT chest, abdomen and pelvis showed new mass in the head of the pancreas, concerning for possible pancreatic adenocarcinoma, complicated by pancreatitis.  She was admitted for further evaluation.  Today we were consulted for additional recommendations.  CT chest, abdomen and pelvis with contrast on 12/08/2022 showed very extensive, expansile soft tissue, fat stranding, and cystic change throughout the pancreatic head, encasing multiple adjacent structures including the portal vein and central mesenteric vessels, the superior mesenteric artery, and portions of the descending duodenum. Maximum apparent cross-section of the pancreatic head is 6.4 x 5.3 cm. Severe intrahepatic biliary ductal dilatation with a distended gallbladder. 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  Multiple peripancreatic fluid collections, including a collection overlying the pancreatic neck and body measuring 6.9 x 5.3  cm, and extending inferiorly from the lateral pancreatic head measuring 4.0 x 3.5 cm, of uncertain acuity, consistent with acute pancreatic fluid collections or pseudocysts. The presence or absence of infection within this fluid is not established by imaging. Constellation of findings is of uncertain significance, highly concerning for pancreatic adenocarcinoma complicated by pancreatitis, however at least conceivably this could reflect benign, although unusually severe acute on chronic pancreatitis. 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 No evidence of lymphadenopathy or metastatic disease in the chest, abdomen, or pelvis.  She was hospitalized at the end of June 2023 with acute alcoholic pancreatitis.  GI saw her in consultation on 07/24/2021.  CT scan showed a large fluid collection compressing the stomach.  There was also concern for underlying pancreatic mass but it was difficult to sort out in the midst of acute pancreatitis.  She was treated with vigorous hydration, supportive care.    Repeat CT scan on 07/27/21 showed a slight increase in now heterogeneous pancreatic lesion measuring 6.2 x 3.2 cm, up from 5.7 x 2.7 cm.  Increase in density of lesion concerning for underlying hemorrhage. Once medically stable Interventional Radiology saw patient, removed ~ 1 liter of dark fluid. .Cytology was negative for neoplastic cells.  She was discharged on 7/12 with plans for outpatient EUS to evaluate for ? Pancreatic mass.   She had an outpatient EUS performed 07/2021 with no signs of pancreatic mass or chronic pancreatitis. Dr.Jacobs (GI) felt the peripancreatic fluid collections were likely inflammatory and related to recurrent alcoholic pancreatitis. Negative cytology.  CA 19-9 was normal at 13.    INTERVAL HISTORY:  Patient seen and  evaluated.  Her daughter was by the bedside.  She  does report about 30 pound weight loss in the last 1 year.  Her appetite has been decreased within the last 3 to 4 months.  Denies any abdominal pain currently.  Reports occasional black stools but denies frank blood in stools.  She has been abstinent from alcohol for about an year.  MEDICAL HISTORY Past Medical History:  Diagnosis Date   Alcohol abuse    Anxiety    Chickenpox    Depression    Neuromuscular disorder (HCC)    neuropathy   Substance abuse (HCC)      SURGICAL HISTORY Past Surgical History:  Procedure Laterality Date   CESAREAN SECTION     ESOPHAGOGASTRODUODENOSCOPY N/A 08/14/2021   Procedure: ESOPHAGOGASTRODUODENOSCOPY (EGD);  Surgeon: Rachael Fee, MD;  Location: [facility];  Service: Gastroenterology;  Laterality: N/A;   EUS N/A 08/14/2021   Procedure: UPPER ENDOSCOPIC ULTRASOUND (EUS) RADIAL;  Surgeon: Rachael Fee, MD;  Location: [facility];  Service: Gastroenterology;  Laterality: N/A;   FINE NEEDLE ASPIRATION N/A 08/14/2021   Procedure: FINE NEEDLE ASPIRATION (FNA) LINEAR;  Surgeon: Rachael Fee, MD;  Location: [facility];  Service: Gastroenterology;  Laterality: N/A;   TONSILLECTOMY       ALLERGIES  No Known Allergies  FAMILY HISTORY  Family History  Problem Relation Age of Onset   Dementia Mother    Arthritis Father    Asthma Father    Asthma Brother    Diabetes Maternal Grandmother    Dementia Maternal Grandmother    Diabetes Paternal Grandmother    Parkinson's disease Maternal Grandfather    Parkinson's disease Paternal Grandfather    Breast cancer Neg Hx    Colon cancer Neg Hx      SOCIAL HISTORY   Social History   Socioeconomic History   Marital status: Divorced    Spouse name: Not on file   Number of children: Not on file   Years of education: Not on file   Highest education level: Not on file  Occupational History   Not on file  Tobacco Use   Smoking status: Some  Days    Current packs/day: 0.50    Types: Cigarettes   Smokeless tobacco: Never  Vaping Use   Vaping status: Never Used  Substance and Sexual Activity   Alcohol use: Not Currently    Alcohol/week: 16.0 standard drinks of alcohol    Types: 16 Glasses of wine per week    Comment: daily (1.5 liter bottle wine every 12)   Drug use: Not Currently    Types: Cocaine, Marijuana   Sexual activity: Yes    Partners: Male    Comment: female partner  Other Topics Concern   Not on file  Social History Narrative   Divorced. Homemaker. HS grad.    Lives with life partner (stan)   Drinks caffeine.   Wears seatbelt. Smoke dectector in the home. No firearms in the home.    Feels safe in her relationships.     Social Determinants of Health   Financial Resource Strain: Not on file  Food Insecurity: No Food Insecurity (12/08/2022)   Hunger Vital Sign    Worried About Running Out of Food in the Last Year: Never true    Ran Out of Food in the Last Year: Never true  Transportation Needs: No Transportation Needs (12/08/2022)   PRAPARE - Administrator, Civil Service (Medical): No    Lack of Transportation (Non-Medical): No  Physical Activity: Not on file  Stress: Not  on file  Social Connections: Not on file  Intimate Partner Violence: Not At Risk (12/08/2022)   Humiliation, Afraid, Rape, and Kick questionnaire    Fear of Current or Ex-Partner: No    Emotionally Abused: No    Physically Abused: No    Sexually Abused: No    CURRENT MEDICATIONS   Current Outpatient Medications  Medication Instructions   albuterol (VENTOLIN HFA) 108 (90 Base) MCG/ACT inhaler 2 puffs, Inhalation, Every 6 hours PRN   folic acid (FOLVITE) 1 mg, Oral, Daily   gabapentin (NEURONTIN) 600 mg, Oral, 2 times daily   mirtazapine (REMERON) 15 mg, Oral, Daily at bedtime   mirtazapine (REMERON) 45 mg, Oral, Daily at bedtime   mometasone-formoterol (DULERA) 200-5 MCG/ACT AERO 2 puffs, Inhalation, 2 times daily    QUEtiapine (SEROQUEL) 25 mg, Oral, Daily at bedtime   QUEtiapine (SEROQUEL) 100-200 mg, Oral, Daily at bedtime   thiamine (VITAMIN B1) 100 mg, Oral, Daily     REVIEW OF SYSTEMS   Review of Systems - Oncology  All other pertinent review of systems is negative except as mentioned above in HPI  PHYSICAL EXAMINATION  ECOG PERFORMANCE STATUS: 1 - Symptomatic but completely ambulatory  Vitals:   12/08/22 2144 12/09/22 0448  BP: 101/68 (!) 94/52  Pulse: 96 87  Resp: 18 18  Temp: 99.1 F (37.3 C) 98.4 F (36.9 C)  SpO2: 91% 93%   Filed Weights   12/08/22 1216  Weight: 110 lb (49.9 kg)    Physical Exam Constitutional:      General: She is not in acute distress.    Appearance: Normal appearance.     Comments: Thin appearing  HENT:     Head: Normocephalic and atraumatic.  Eyes:     General: No scleral icterus.    Conjunctiva/sclera: Conjunctivae normal.  Cardiovascular:     Rate and Rhythm: Normal rate and regular rhythm.     Heart sounds: Normal heart sounds.  Pulmonary:     Effort: Pulmonary effort is normal.     Breath sounds: Normal breath sounds.  Abdominal:     General: There is no distension.  Musculoskeletal:     Right lower leg: No edema.     Left lower leg: No edema.  Neurological:     General: No focal deficit present.     Mental Status: She is alert and oriented to person, place, and time.  Psychiatric:        Mood and Affect: Mood normal.        Behavior: Behavior normal.        Thought Content: Thought content normal.     LABORATORY DATA:   I have reviewed the data as listed  Results for orders placed or performed during the hospital encounter of 12/08/22 (from the past 24 hour(s))  Hepatic function panel   Collection Time: 12/08/22 12:31 PM  Result Value Ref Range   Total Protein 7.0 6.5 - 8.1 g/dL   Albumin 3.5 3.5 - 5.0 g/dL   AST 26 15 - 41 U/L   ALT 9 0 - 44 U/L   Alkaline Phosphatase 88 38 - 126 U/L   Total Bilirubin 0.6 <1.2 mg/dL    Bilirubin, Direct 0.1 0.0 - 0.2 mg/dL   Indirect Bilirubin 0.5 0.3 - 0.9 mg/dL  Lipase, blood   Collection Time: 12/08/22 12:31 PM  Result Value Ref Range   Lipase 57 (H) 11 - 51 U/L  Amylase   Collection Time: 12/08/22 12:31 PM  Result  Value Ref Range   Amylase 42 28 - 100 U/L  CBG monitoring, ED   Collection Time: 12/08/22 12:31 PM  Result Value Ref Range   Glucose-Capillary 105 (H) 70 - 99 mg/dL  Basic metabolic panel   Collection Time: 12/08/22 12:36 PM  Result Value Ref Range   Sodium 135 135 - 145 mmol/L   Potassium 3.4 (L) 3.5 - 5.1 mmol/L   Chloride 87 (L) 98 - 111 mmol/L   CO2 37 (H) 22 - 32 mmol/L   Glucose, Bld 131 (H) 70 - 99 mg/dL   BUN 11 8 - 23 mg/dL   Creatinine, Ser 1.30 0.44 - 1.00 mg/dL   Calcium 9.0 8.9 - 86.5 mg/dL   GFR, Estimated >78 >46 mL/min   Anion gap 11 5 - 15  CBC   Collection Time: 12/08/22 12:36 PM  Result Value Ref Range   WBC 10.1 4.0 - 10.5 K/uL   RBC 4.58 3.87 - 5.11 MIL/uL   Hemoglobin 15.2 (H) 12.0 - 15.0 g/dL   HCT 96.2 95.2 - 84.1 %   MCV 98.9 80.0 - 100.0 fL   MCH 33.2 26.0 - 34.0 pg   MCHC 33.6 30.0 - 36.0 g/dL   RDW 32.4 40.1 - 02.7 %   Platelets 424 (H) 150 - 400 K/uL   nRBC 0.0 0.0 - 0.2 %  HIV Antibody (routine testing w rflx)   Collection Time: 12/08/22  6:47 PM  Result Value Ref Range   HIV Screen 4th Generation wRfx Non Reactive Non Reactive  Glucose, capillary   Collection Time: 12/09/22 12:46 AM  Result Value Ref Range   Glucose-Capillary 136 (H) 70 - 99 mg/dL   Comment 1 Notify RN   Comprehensive metabolic panel   Collection Time: 12/09/22  5:31 AM  Result Value Ref Range   Sodium 137 135 - 145 mmol/L   Potassium 2.1 (LL) 3.5 - 5.1 mmol/L   Chloride 94 (L) 98 - 111 mmol/L   CO2 34 (H) 22 - 32 mmol/L   Glucose, Bld 123 (H) 70 - 99 mg/dL   BUN 10 8 - 23 mg/dL   Creatinine, Ser 2.53 0.44 - 1.00 mg/dL   Calcium 8.0 (L) 8.9 - 10.3 mg/dL   Total Protein 5.1 (L) 6.5 - 8.1 g/dL   Albumin 2.5 (L) 3.5 - 5.0 g/dL    AST 18 15 - 41 U/L   ALT 12 0 - 44 U/L   Alkaline Phosphatase 67 38 - 126 U/L   Total Bilirubin 0.3 <1.2 mg/dL   GFR, Estimated >66 >44 mL/min   Anion gap 9 5 - 15  CBC   Collection Time: 12/09/22  5:31 AM  Result Value Ref Range   WBC 7.5 4.0 - 10.5 K/uL   RBC 3.68 (L) 3.87 - 5.11 MIL/uL   Hemoglobin 12.4 12.0 - 15.0 g/dL   HCT 03.4 74.2 - 59.5 %   MCV 101.6 (H) 80.0 - 100.0 fL   MCH 33.7 26.0 - 34.0 pg   MCHC 33.2 30.0 - 36.0 g/dL   RDW 63.8 75.6 - 43.3 %   Platelets 380 150 - 400 K/uL   nRBC 0.0 0.0 - 0.2 %  Magnesium   Collection Time: 12/09/22  5:31 AM  Result Value Ref Range   Magnesium 2.1 1.7 - 2.4 mg/dL  Glucose, capillary   Collection Time: 12/09/22  6:07 AM  Result Value Ref Range   Glucose-Capillary 108 (H) 70 - 99 mg/dL  Glucose, capillary  Collection Time: 12/09/22 11:51 AM  Result Value Ref Range   Glucose-Capillary 117 (H) 70 - 99 mg/dL      RADIOGRAPHIC STUDIES:  I have personally reviewed the radiological images as listed and agree with the findings in the report.  CT CHEST ABDOMEN PELVIS W CONTRAST  Result Date: 12/08/2022 CLINICAL DATA:  Dysphagia, decreasing appetite and weight loss * Tracking Code: BO * EXAM: CT CHEST, ABDOMEN, AND PELVIS WITH CONTRAST TECHNIQUE: Multidetector CT imaging of the chest, abdomen and pelvis was performed following the standard protocol during bolus administration of intravenous contrast. RADIATION DOSE REDUCTION: This exam was performed according to the departmental dose-optimization program which includes automated exposure control, adjustment of the mA and/or kV according to patient size and/or use of iterative reconstruction technique. CONTRAST:  75mL OMNIPAQUE IOHEXOL 300 MG/ML  SOLN COMPARISON:  CT chest, 07/24/2021, abdomen pelvis, 07/27/2021 FINDINGS: CT CHEST FINDINGS Cardiovascular: Scattered aortic atherosclerosis. Normal heart size. Left coronary artery calcifications. Small pericardial effusion.  Mediastinum/Nodes: No enlarged mediastinal, hilar, or axillary lymph nodes. Thyroid gland, trachea, and esophagus demonstrate no significant findings. Lungs/Pleura: Diminished ground-glass opacity of the medial left apex measuring 0.7 cm, previously 1.0 cm, nonspecific and infectious or inflammatory requiring no specific further follow-up (series 4, image 37). Mild centrilobular emphysema. Benign, bandlike scarring of the left lung base. No pleural effusion or pneumothorax. Musculoskeletal: No chest wall abnormality. No acute osseous findings. CT ABDOMEN PELVIS FINDINGS Hepatobiliary: Heterogeneous perfusion of the liver. Severe intra hepatic biliary ductal dilatation with a distended gallbladder. 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 Partial cavernous transformation. Pancreas: Very extensive, expansile soft tissue, fat stranding, and cystic change throughout the pancreatic head, encasing multiple adjacent structures including the portal vein and central mesenteric vessels, the superior mesenteric artery, and portions of the descending duodenum (series 2, image 66). Maximum apparent cross-section of the pancreatic head is 6.4 x 5.3 cm (series 2, image 71). Severe atrophy of the distal pancreatic parenchyma as well as diffuse ductal dilatation distally. Multiple fluid collections, including a collection overlying the pancreatic neck and body measuring 6.9 x 5.3 cm (series 2, image 69), and extending inferiorly from the lateral pancreatic head measuring 4.0 x 3.5 cm (series 2, image 77). Spleen: Normal in size without significant abnormality. Adrenals/Urinary Tract: Adenomatous thickening of the adrenal glands, requiring no specific further follow-up or characterization. Kidneys are normal, without renal calculi, solid lesion, or hydronephrosis.  Bladder is unremarkable. Stomach/Bowel: Stomach is within normal limits. Appendix appears normal. No evidence of bowel wall thickening, distention, or inflammatory changes. Vascular/Lymphatic: Aortic atherosclerosis. No enlarged abdominal or pelvic lymph nodes. Reproductive: Calcified uterine fibroids. Other: No abdominal wall hernia or abnormality. No ascites. Musculoskeletal: No acute osseous findings. IMPRESSION: 1. Very extensive, expansile soft tissue, fat stranding, and cystic change throughout the pancreatic head, encasing multiple adjacent structures including the portal vein and central mesenteric vessels, the superior mesenteric artery, and portions of the descending duodenum. Maximum apparent cross-section of the pancreatic head is 6.4 x 5.3 cm. 2. Severe intrahepatic biliary ductal dilatation with a distended gallbladder. 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 3. Multiple peripancreatic fluid collections, including a collection overlying the pancreatic  neck and body measuring 6.9 x 5.3 cm, and extending inferiorly from the lateral pancreatic head measuring 4.0 x 3.5 cm, of uncertain acuity, consistent with acute pancreatic fluid collections or pseudocysts. The presence or absence of infection within this fluid is not established by imaging. 4. Constellation of findings is of uncertain significance, highly concerning for pancreatic adenocarcinoma complicated by pancreatitis, however at least conceivably this could reflect benign, although unusually severe acute on chronic pancreatitis. 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 5. No evidence of  lymphadenopathy or metastatic disease in the chest, abdomen, or pelvis. 6. Emphysema. 7. Coronary artery disease. Aortic Atherosclerosis (ICD10-I70.0) and Emphysema (ICD10-J43.9). Electronically Signed   By: Jearld Lesch M.D.   On: 12/08/2022 14:56    ASSESSMENT & PLAN:   62 y.o. lady with history of alcohol abuse, alcoholic pancreatitis, GERD, anxiety, depression, nicotine dependence, was admitted to the hospital overnight after she presented to the [facility] ED with complaints of decreased appetite over the last few months, unintentional weight loss, fatigue.  CT chest, abdomen and pelvis showed new mass in the head of the pancreas, concerning for possible pancreatic adenocarcinoma, complicated by pancreatitis.  She was admitted for further evaluation.  Today we were consulted for additional recommendations.  Please review HPI above for additional details.  She has had GI workup in July 2023 and also IR guided drainage of an attic pseudocyst in June 2023.  Workup at that time was not suggestive of malignancy.  However current CT scan is concerning for pancreatic adenocarcinoma complicated by pancreatitis with definite change noted in pancreatic head mass, by radiologist, compared to previous scans.  GI has been consulted and they are planning for EBUS/biopsy.  CA 19-9 is pending.  LFTs are within normal limits.  Has severe hypoalbuminemia, hypokalemia.   Discussed potential diagnosis with the patient and her daughter by the bedside.  Clinical picture is concerning for pancreatic adenocarcinoma, unless proven otherwise.  We will need biopsy confirmation before further treatment decisions can be made.  Briefly discussed multidisciplinary treatment approach for pancreatic adenocarcinoma including surgical oncology, medical oncology and radiation oncology.  The soft tissue noted on CT scan in the pancreatic head is encasing multiple adjacent structures including portal vein and central mesenteric  vessels, SMA and portion of descending duodenum, which makes her not a candidate for surgery, in case of proven pancreatic adenocarcinoma.  In such a case, treatment would involve chemotherapy with or without radiation.  Further treatment plan will be discussed once tissue diagnosis is available.  Rest of care as per primary team and other specialties.  Thanks for the opportunity to participate in the care of this patient. Please contact me if there are any questions.   Meryl Crutch, MD Medical Oncology and Hematology 12/09/2022 11:58 AM    This document was completed utilizing speech recognition software. Grammatical errors, random word insertions, pronoun errors, and incomplete sentences are an occasional consequence of this system due to software limitations, ambient noise, and hardware issues. Any formal questions or concerns about the content, text or information contained within the body of this dictation should be directly addressed to the provider for clarification.

## 2022-12-09 NOTE — Plan of Care (Signed)
  Problem: Education: Goal: Knowledge of General Education information will improve Description Including pain rating scale, medication(s)/side effects and non-pharmacologic comfort measures Outcome: Progressing   

## 2022-12-10 ENCOUNTER — Inpatient Hospital Stay (HOSPITAL_COMMUNITY): Payer: Medicaid Other

## 2022-12-10 ENCOUNTER — Encounter (HOSPITAL_COMMUNITY)
Admission: EM | Disposition: A | Discharge status: Home / Self Care | Payer: Self-pay | Source: Home / Self Care | Attending: Student

## 2022-12-10 ENCOUNTER — Encounter (HOSPITAL_COMMUNITY): Payer: Self-pay | Admitting: Student

## 2022-12-10 DIAGNOSIS — K222 Esophageal obstruction: Secondary | ICD-10-CM

## 2022-12-10 DIAGNOSIS — K209 Esophagitis, unspecified without bleeding: Secondary | ICD-10-CM

## 2022-12-10 DIAGNOSIS — K2081 Other esophagitis with bleeding: Secondary | ICD-10-CM

## 2022-12-10 HISTORY — PX: BIOPSY: SHX5522

## 2022-12-10 HISTORY — PX: ESOPHAGOGASTRODUODENOSCOPY (EGD) WITH PROPOFOL: SHX5813

## 2022-12-10 LAB — COMPREHENSIVE METABOLIC PANEL
ALT: 12 U/L (ref 0–44)
AST: 19 U/L (ref 15–41)
Albumin: 2.4 g/dL — ABNORMAL LOW (ref 3.5–5.0)
Alkaline Phosphatase: 77 U/L (ref 38–126)
Anion gap: 7 (ref 5–15)
BUN: 5 mg/dL — ABNORMAL LOW (ref 8–23)
CO2: 26 mmol/L (ref 22–32)
Calcium: 7.8 mg/dL — ABNORMAL LOW (ref 8.9–10.3)
Chloride: 102 mmol/L (ref 98–111)
Creatinine, Ser: 0.4 mg/dL — ABNORMAL LOW (ref 0.44–1.00)
GFR, Estimated: >60 mL/min (ref >60)
Glucose, Bld: 103 mg/dL — ABNORMAL HIGH (ref 70–99)
Potassium: 3.2 mmol/L — ABNORMAL LOW (ref 3.5–5.1)
Sodium: 135 mmol/L (ref 135–145)
Total Bilirubin: 0.5 mg/dL (ref <1.2)
Total Protein: 5 g/dL — ABNORMAL LOW (ref 6.5–8.1)

## 2022-12-10 LAB — CBC
HCT: 39.1 % (ref 36.0–46.0)
Hemoglobin: 12.6 g/dL (ref 12.0–15.0)
MCH: 33.2 pg (ref 26.0–34.0)
MCHC: 32.2 g/dL (ref 30.0–36.0)
MCV: 102.9 fL — ABNORMAL HIGH (ref 80.0–100.0)
Platelets: 355 10*3/uL (ref 150–400)
RBC: 3.8 MIL/uL — ABNORMAL LOW (ref 3.87–5.11)
RDW: 12.9 % (ref 11.5–15.5)
WBC: 6.8 10*3/uL (ref 4.0–10.5)
nRBC: 0 % (ref 0.0–0.2)

## 2022-12-10 LAB — GLUCOSE, CAPILLARY
Glucose-Capillary: 102 mg/dL — ABNORMAL HIGH (ref 70–99)
Glucose-Capillary: 103 mg/dL — ABNORMAL HIGH (ref 70–99)
Glucose-Capillary: 104 mg/dL — ABNORMAL HIGH (ref 70–99)
Glucose-Capillary: 118 mg/dL — ABNORMAL HIGH (ref 70–99)

## 2022-12-10 LAB — MAGNESIUM: Magnesium: 2.2 mg/dL (ref 1.7–2.4)

## 2022-12-10 SURGERY — ESOPHAGOGASTRODUODENOSCOPY (EGD) WITH PROPOFOL
Anesthesia: Monitor Anesthesia Care

## 2022-12-10 MED ORDER — PROPOFOL 500 MG/50ML IV EMUL
INTRAVENOUS | Status: DC | PRN
  Administered 2022-12-10: 120 ug/kg/min via INTRAVENOUS

## 2022-12-10 MED ORDER — METOCLOPRAMIDE HCL 5 MG/ML IJ SOLN
10.0000 mg | Freq: Two times a day (BID) | INTRAMUSCULAR | Status: AC
  Administered 2022-12-11 (×2): 10 mg via INTRAVENOUS
  Filled 2022-12-10 (×2): qty 2

## 2022-12-10 MED ORDER — PANTOPRAZOLE SODIUM 40 MG PO TBEC
40.0000 mg | DELAYED_RELEASE_TABLET | Freq: Two times a day (BID) | ORAL | Status: DC
  Administered 2022-12-10 – 2022-12-11 (×3): 40 mg via ORAL
  Filled 2022-12-10 (×3): qty 1

## 2022-12-10 MED ORDER — LIDOCAINE 2% (20 MG/ML) 5 ML SYRINGE
INTRAMUSCULAR | Status: DC | PRN
Start: 2022-12-10 — End: 2022-12-10
  Administered 2022-12-10: 100 mg via INTRAVENOUS

## 2022-12-10 MED ORDER — SUCRALFATE 1 GM/10ML PO SUSP
1.0000 g | Freq: Three times a day (TID) | ORAL | Status: DC
  Administered 2022-12-10 – 2022-12-18 (×31): 1 g via ORAL
  Filled 2022-12-10 (×31): qty 10

## 2022-12-10 MED ORDER — POTASSIUM CHLORIDE CRYS ER 20 MEQ PO TBCR
40.0000 meq | EXTENDED_RELEASE_TABLET | ORAL | Status: AC
  Administered 2022-12-10 (×2): 40 meq via ORAL
  Filled 2022-12-10 (×2): qty 2

## 2022-12-10 MED ORDER — PROPOFOL 10 MG/ML IV BOLUS
INTRAVENOUS | Status: DC | PRN
  Administered 2022-12-10: 20 mg via INTRAVENOUS
  Administered 2022-12-10: 10 mg via INTRAVENOUS
  Administered 2022-12-10: 20 mg via INTRAVENOUS

## 2022-12-10 MED ORDER — SODIUM CHLORIDE 0.9 % IV SOLN: INTRAVENOUS | Status: DC

## 2022-12-10 NOTE — Anesthesia Preprocedure Evaluation (Signed)
Anesthesia Evaluation  Patient identified by MRN, date of birth, ID band Patient awake    Reviewed: Allergy & Precautions, NPO status , Patient's Chart, lab work & pertinent test results  Airway Mallampati: III  TM Distance: >3 FB Neck ROM: Full    Dental no notable dental hx. (+) Teeth Intact, Dental Advisory Given   Pulmonary COPD,  COPD inhaler, Current Smoker   Pulmonary exam normal breath sounds clear to auscultation       Cardiovascular negative cardio ROS Normal cardiovascular exam Rhythm:Regular Rate:Normal     Neuro/Psych  PSYCHIATRIC DISORDERS Anxiety Depression       GI/Hepatic Hx of alcohol dependence Hx of alcohol withdrawal / DTs Pancreatic Mass   Endo/Other  negative endocrine ROS    Renal/GU negative Renal ROS     Musculoskeletal negative musculoskeletal ROS (+)    Abdominal Normal abdominal exam  (+)   Peds  Hematology negative hematology ROS (+)   Anesthesia Other Findings   Reproductive/Obstetrics                              Anesthesia Physical Anesthesia Plan  ASA: 3  Anesthesia Plan: MAC   Post-op Pain Management:    Induction: Intravenous  PONV Risk Score and Plan: 0 and Propofol infusion  Airway Management Planned: Natural Airway and Simple Face Mask  Additional Equipment: None  Intra-op Plan:   Post-operative Plan: Extubation in OR  Informed Consent: I have reviewed the patients History and Physical, chart, labs and discussed the procedure including the risks, benefits and alternatives for the proposed anesthesia with the patient or authorized representative who has indicated his/her understanding and acceptance.     Dental advisory given  Plan Discussed with: CRNA  Anesthesia Plan Comments:          Anesthesia Quick Evaluation

## 2022-12-10 NOTE — Interval H&P Note (Signed)
History and Physical Interval Note:  12/10/2022 3:30  Kathryn Underwood  has presented today for surgery, with the diagnosis of Pancreatic Mass.  The various methods of treatment have been discussed with the patient and family. After consideration of risks, benefits and other options for treatment, the patient has consented to  Procedure(s): Upper Endoscopic Ultrasound as a surgical intervention.  The patient's history has been reviewed, patient examined, no change in status, stable for surgery.  I have reviewed the patient's chart and labs.  Questions were answered to the patient's satisfaction.    The risks of an EUS including intestinal perforation, bleeding, infection, aspiration, and medication effects were discussed as was the possibility it may not give a definitive diagnosis if a biopsy is performed.  When a biopsy of the pancreas is done as part of the EUS, there is an additional risk of pancreatitis at the rate of about 1-2%.  It was explained that procedure related pancreatitis is typically mild, although it can be severe and even life threatening, which is why we do not perform random pancreatic biopsies and only biopsy a lesion/area we feel is concerning enough to warrant the risk.    [employer]

## 2022-12-10 NOTE — Op Note (Signed)
[facility] Patient Name: Kathryn Underwood Procedure Date: 12/10/2022 MRN: [id number] Attending MD: Corliss Parish , MD, [id number] Date of Birth: 1960/03/18 CSN: [id number] Age: 62 Admit Type: Inpatient Procedure:                Upper GI endoscopy (was to be EUS but not able) Indications:              Dysphagia, Abnormal CT of the GI tract Providers:                Corliss Parish, MD, Fransisca Connors, Harrington Challenger, Technician, Albertina Senegal. Alday CRNA, CRNA Referring MD:              Medicines:                Monitored Anesthesia Care Complications:            No immediate complications. Estimated Blood Loss:     Estimated blood loss was minimal. Procedure:                Pre-Anesthesia Assessment:                           - Prior to the procedure, a History and Physical                            was performed, and patient medications and                            allergies were reviewed. The patient's tolerance of                            previous anesthesia was also reviewed. The risks                            and benefits of the procedure and the sedation                            options and risks were discussed with the patient.                            All questions were answered, and informed consent                            was obtained. Prior Anticoagulants: The patient has                            taken no anticoagulant or antiplatelet agents. ASA                            Grade Assessment: III - A patient with severe                            systemic disease. After reviewing the risks and  benefits, the patient was deemed in satisfactory                            condition to undergo the procedure.                           After obtaining informed consent, the endoscope was                            passed under direct vision. Throughout the                            procedure, the  patient's blood pressure, pulse, and                            oxygen saturations were monitored continuously. The                            [id number]-H190 ([id number]) Olympus endoscope was introduced                            through the mouth, and advanced to the second part                            of duodenum. After obtaining informed consent, the                            endoscope was passed under direct vision.                            Throughout the procedure, the patient's blood                            pressure, pulse, and oxygen saturations were                            monitored continuously.The upper EUS was                            accomplished without difficulty. The patient                            tolerated the procedure. Scope In: Scope Out: Findings:      LA Grade D (one or more mucosal breaks involving at least 75% of       esophageal circumference) esophagitis with bleeding was found 25 to 39       cm from the incisors. Biopsies were taken with a cold forceps for       histology.      One benign-appearing, intrinsic moderate (circumferential scarring or       stenosis; an endoscope may pass) stenosis was found in the distal       esophagus. This stenosis measured approximately 1.4 cm (inner diameter)       x 2 cm (in length). The stenosis was traversed with the adult endoscope.  The Z-line was irregular and was found 39 cm from the incisors.      A 3 cm hiatal hernia was present.      A large amount of food (residue) was found in the entire examined       stomach. Suction via Endoscope was performed but there are foodstuffs       that are not just liquid and solid in component so could not clear       completely.      Food (residue) was found in the duodenal bulb as well. Impression:               - LA Grade D erosive esophagitis with bleeding                            (found through the majority of the esophagus).                             Biopsied.                           - Benign-appearing esophageal stenosis noted                            distally.                           - Z-line irregular, 39 cm from the incisors.                           - 3 cm hiatal hernia.                           - A large amount of food (residue) in the stomach.                           - Retained food in the duodenum. Moderate Sedation:      Not Applicable - Patient had care per Anesthesia. Recommendation:           - The patient will be observed post-procedure,                            until all discharge criteria are met.                           - Return patient to hospital ward for ongoing care.                           - Resume previous diet today.                           - Full liquid diet tomorrow.                           - Reglan 10 mg twice daily on Friday to ensure  passage of foodstuffs.                           - N.p.o. at midnight on Saturday morning.                           - Will attempt to perform EUS on Saturday, pending                            anesthesia availability.                           - Observe patient's clinical course.                           - Await pathology results.                           - PPI 40 mg twice daily.                           - Carafate liquid slurry 1 g 4 times daily.                           - The findings and recommendations were discussed                            with the patient.                           - The findings and recommendations were discussed                            with the designated responsible adult. Procedure Code(s):        --- Professional ---                           [phone], Esophagogastroduodenoscopy, flexible,                            transoral; with biopsy, single or multiple Diagnosis Code(s):        --- Professional ---                           K20.81, Other esophagitis with bleeding                            K22.2, Esophageal obstruction                           K22.89, Other specified disease of esophagus                           K44.9, Diaphragmatic hernia without obstruction or                            gangrene  R13.10, Dysphagia, unspecified                           R93.3, Abnormal findings on diagnostic imaging of                            other parts of digestive tract CPT copyright 2022 [employer]. All rights reserved. The codes documented in this report are preliminary and upon coder review may  be revised to meet current compliance requirements. Corliss Parish, MD 12/10/2022 4:35:04 PM Number of Addenda: 0

## 2022-12-10 NOTE — Plan of Care (Addendum)
Patient refusing IV & Oral Potassium. K+ 3.2 this morning, MD Gonfa notified and patient understood hypokalemia may delay procedure. Patient agreeable to take it and MD notified.  Problem: Education: Goal: Knowledge of General Education information will improve Description: Including pain rating scale, medication(s)/side effects and non-pharmacologic comfort measures Outcome: Not Progressing   Problem: Health Behavior/Discharge Planning: Goal: Ability to manage health-related needs will improve Outcome: Not Progressing   Problem: Clinical Measurements: Goal: Ability to maintain clinical measurements within normal limits will improve Outcome: Not Progressing Goal: Will remain free from infection Outcome: Not Progressing Goal: Diagnostic test results will improve Outcome: Not Progressing Goal: Respiratory complications will improve Outcome: Not Progressing Goal: Cardiovascular complication will be avoided Outcome: Not Progressing   Problem: Activity: Goal: Risk for activity intolerance will decrease Outcome: Not Progressing   Problem: Nutrition: Goal: Adequate nutrition will be maintained Outcome: Not Progressing   Problem: Coping: Goal: Level of anxiety will decrease Outcome: Not Progressing   Problem: Elimination: Goal: Will not experience complications related to bowel motility Outcome: Not Progressing Goal: Will not experience complications related to urinary retention Outcome: Not Progressing   Problem: Pain Management: Goal: General experience of comfort will improve Outcome: Not Progressing   Problem: Safety: Goal: Ability to remain free from injury will improve Outcome: Not Progressing   Problem: Skin Integrity: Goal: Risk for impaired skin integrity will decrease Outcome: Not Progressing

## 2022-12-10 NOTE — H&P (View-Only) (Signed)
Progress Note   Subjective  Hospital day #2 Chief Complaint: Pancreatic Mass  Patient tells me she continues to feel ill today.  Apparently the Potassium has "kicked her butt".  Tells me she is nauseous and trying to chew on some ice chips to help.  Aware that her EUS is scheduled later today.    Objective   Vital signs in last 24 hours: Temp:  [98.4 F (36.9 C)-99 F (37.2 C)] 98.4 F (36.9 C) (11/14 0627) Pulse Rate:  [88-99] 88 (11/14 0627) Resp:  [18-20] 20 (11/14 0627) BP: (114-119)/(71-75) 119/75 (11/14 0627) SpO2:  [92 %-95 %] 95 % (11/14 0627) Last BM Date : 12/09/22 General:    Ill-appearing, thin, white female in NAD Heart:  Regular rate and rhythm; no murmurs Lungs: Respirations even and unlabored, lungs CTA bilaterally Abdomen:  Soft, nontender and nondistended. Normal bowel sounds. Psych:  Cooperative. Normal mood and affect.  Intake/Output from previous day: 11/13 0701 - 11/14 0700 In: 668.8 [P.O.:240; I.V.:428.8] Out: -    Lab Results: Recent Labs    12/08/22 1236 12/09/22 0531 12/10/22 0544  WBC 10.1 7.5 6.8  HGB 15.2* 12.4 12.6  HCT 45.3 37.4 39.1  PLT 424* 380 355   BMET Recent Labs    12/09/22 0531 12/09/22 1745 12/10/22 0544  NA 137 138 135  K 2.1* 3.1* 3.2*  CL 94* 96* 102  CO2 34* 33* 26  GLUCOSE 123* 114* 103*  BUN 10 9 5*  CREATININE 0.55 0.47 0.40*  CALCIUM 8.0* 8.3* 7.8*   LFT Recent Labs    12/08/22 1231 12/09/22 0531 12/10/22 0544  PROT 7.0   < > 5.0*  ALBUMIN 3.5   < > 2.4*  AST 26   < > 19  ALT 9   < > 12  ALKPHOS 88   < > 77  BILITOT 0.6   < > 0.5  BILIDIR 0.1  --   --   IBILI 0.5  --   --    < > = values in this interval not displayed.   Studies/Results: MR ABDOMEN MRCP W WO CONTAST  Result Date: 12/09/2022 CLINICAL DATA:  Pancreatitis with a pancreatic mass. EXAM: MRI ABDOMEN WITHOUT AND WITH CONTRAST (INCLUDING MRCP) TECHNIQUE: Multiplanar multisequence MR imaging of the abdomen was performed both  before and after the administration of intravenous contrast. Heavily T2-weighted images of the biliary and pancreatic ducts were obtained, and three-dimensional MRCP images were rendered by post processing. CONTRAST:  5mL GADAVIST GADOBUTROL 1 MMOL/ML IV SOLN COMPARISON:  CT scans including 12/08/2022, 07/27/2021, 07/24/2021 for comparison. MRCP 08/03/2021. FINDINGS: Lower chest: Slight linear signal at the lung bases. Likely scar or atelectasis. Moderate pericardial effusion again identified. Hepatobiliary: There are geographic areas of signal drop on out of phase imaging in the liver particularly involving segments 5, 6 and 4 consistent with a geographic areas of fatty infiltration. There is mosaic appearance to enhancement of the liver in the arterial phase as well. As seen on prior CT there is thrombus within the main portal vein with several upper abdominal collateral vessels. Separate multiple hepatic cysts identified which are predominantly under a cm in size. On series 27, image 11 in the dome of the right hepatic lobe segment 8 is a small enhancing nodule measuring 10 mm. This has mildly bright on T2 precontrast. This has an indeterminate lesion. There is moderate intrahepatic biliary ductal dilatation diffusely. The common hepatic duct has a diameter of 15 mm proximally with focal  high-grade stricture over a long segment of the common duct extending down to the pancreatic head and ampulla. Gallbladder is dilated with layering sludge and possible stones. Pancreas: Dysmorphic appearance to the pancreas. They are smaller areas of residual pancreatic normal enhancement in the body and head/uncinate region. Centered along the neck and head region is a spiculated heterogeneous mass as seen on CT scan showing restricted diffusion. This has mass somewhat difficult to measure with the surrounding extensive other abnormality but is estimated in dimension on series 27, image 50 4.9 x 5.5 cm. This extends caudal to  the pancreas into the central mesentery and is associated with occlusion of the splenic vein. There is encasement of the splenic artery. Extensive central mesenteric collaterals identified. There are several areas which has poor enhancement along the tail as well which could be the sequela of additional history of pancreatitis. The fluid collection extending anterior to the pancreas along the lesser sac on today's examination measures 6.0 by 5.2 cm. There are several other smaller collections identified adjacent to this larger. There is also a significant fluid collection in the central mesentery just caudal to the head of the pancreas on series 26, image 25 measuring 5.0 by 3.1 cm. In principle, these has a differential but easily could be pseudocysts. No associated nodular wall enhancement. Spleen:  Within normal limits in size and appearance. Adrenals/Urinary Tract: No masses identified. No evidence of hydronephrosis. Stomach/Bowel: Debris noted in the nondilated stomach. The visualized small and large bowel are nondilated. Scattered colonic stool. Vascular/Lymphatic: Atherosclerotic changes identified. Normal caliber aorta and IVC. 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 The aforementioned mass lesion does appear to encase the SMA with some narrowing. Other:  Mild ascites. Musculoskeletal: Mild degenerative changes along the spine. IMPRESSION: Large locally aggressive heterogeneous soft tissue mass with spiculations identified centered of the neck and head of the pancreas. Associated involvement of the SMA and SMV with the occlusion of the SMV and narrowing of the SMA. 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 Worrisome for pancreatic neoplasm or adenocarcinoma. Relatively poor visualization of the rest of the pancreas with the areas of distortion poor enhancement.  This could be the sequela of neoplasm in the additional changes of pancreatitis. Several fluid collections are identified which could represent pseudo cysts with the largest extending along the lesser sac measuring up to 6 cm. Additional dominant collection seen caudal to the pancreatic head. 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 The gallbladder is also dilated with layering sludge and or stones. Multiple cystic liver lesions identified. There is 1 enhancing lesion in the dome in segment 8 which is indeterminate. With the pancreatic findings this could be spread of disease but would has a differential recommend attention on short follow-up. Geographic areas of fatty liver infiltration which can relate to the hepatic blood flow abnormalities. Electronically Signed   By: Karen Kays M.D.   On: 12/09/2022 14:48   MR 3D Recon At Scanner  Result Date: 12/09/2022 CLINICAL DATA:  Pancreatitis with a pancreatic mass. EXAM: MRI ABDOMEN WITHOUT AND WITH CONTRAST (INCLUDING MRCP) TECHNIQUE: Multiplanar multisequence MR imaging of the abdomen was performed both before and after the administration of intravenous contrast. Heavily T2-weighted images of the biliary and pancreatic ducts were obtained, and three-dimensional MRCP  images were rendered by post processing. CONTRAST:  5mL GADAVIST GADOBUTROL 1 MMOL/ML IV SOLN COMPARISON:  CT scans including 12/08/2022, 07/27/2021, 07/24/2021 for comparison. MRCP 08/03/2021. FINDINGS: Lower chest: Slight linear signal at the lung bases. Likely scar or atelectasis. Moderate pericardial effusion again identified. Hepatobiliary: There are geographic areas of signal drop on out of phase imaging in the liver particularly involving segments 5, 6 and 4 consistent with a geographic areas of fatty infiltration. There is mosaic appearance to  enhancement of the liver in the arterial phase as well. As seen on prior CT there is thrombus within the main portal vein with several upper abdominal collateral vessels. Separate multiple hepatic cysts identified which are predominantly under a cm in size. On series 27, image 11 in the dome of the right hepatic lobe segment 8 is a small enhancing nodule measuring 10 mm. This has mildly bright on T2 precontrast. This has an indeterminate lesion. There is moderate intrahepatic biliary ductal dilatation diffusely. The common hepatic duct has a diameter of 15 mm proximally with focal high-grade stricture over a long segment of the common duct extending down to the pancreatic head and ampulla. Gallbladder is dilated with layering sludge and possible stones. Pancreas: Dysmorphic appearance to the pancreas. They are smaller areas of residual pancreatic normal enhancement in the body and head/uncinate region. Centered along the neck and head region is a spiculated heterogeneous mass as seen on CT scan showing restricted diffusion. This has mass somewhat difficult to measure with the surrounding extensive other abnormality but is estimated in dimension on series 27, image 50 4.9 x 5.5 cm. This extends caudal to the pancreas into the central mesentery and is associated with occlusion of the splenic vein. There is encasement of the splenic artery. Extensive central mesenteric collaterals identified. There are several areas which has poor enhancement along the tail as well which could be the sequela of additional history of pancreatitis. The fluid collection extending anterior to the pancreas along the lesser sac on today's examination measures 6.0 by 5.2 cm. There are several other smaller collections identified adjacent to this larger. There is also a significant fluid collection in the central mesentery just caudal to the head of the pancreas on series 26, image 25 measuring 5.0 by 3.1 cm. In principle, these has a  differential but easily could be pseudocysts. No associated nodular wall enhancement. Spleen:  Within normal limits in size and appearance. Adrenals/Urinary Tract: No masses identified. No evidence of hydronephrosis. Stomach/Bowel: Debris noted in the nondilated stomach. The visualized small and large bowel are nondilated. Scattered colonic stool. Vascular/Lymphatic: Atherosclerotic changes identified. Normal caliber aorta and IVC. 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 The aforementioned mass lesion does appear to encase the SMA with some narrowing. Other:  Mild ascites. Musculoskeletal: Mild degenerative changes along the spine. IMPRESSION: Large locally aggressive heterogeneous soft tissue mass with spiculations identified centered of the neck and head of the pancreas. Associated involvement of the SMA and SMV with the occlusion of the SMV and narrowing of the SMA. 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 Worrisome for pancreatic neoplasm or adenocarcinoma. Relatively poor visualization of the rest of the pancreas with the areas of distortion poor enhancement. This could be the sequela of neoplasm in the additional changes of pancreatitis. Several fluid collections are identified which could represent pseudo cysts with the largest extending along the lesser  sac measuring up to 6 cm. Additional dominant collection seen caudal to the pancreatic head. 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 The gallbladder is also dilated with layering sludge and or stones. Multiple cystic liver lesions identified. There is 1 enhancing lesion in the dome in segment 8 which is indeterminate. With the pancreatic findings this could be spread  of disease but would has a differential recommend attention on short follow-up. Geographic areas of fatty liver infiltration which can relate to the hepatic blood flow abnormalities. Electronically Signed   By: Karen Kays M.D.   On: 12/09/2022 14:48   CT CHEST ABDOMEN PELVIS W CONTRAST  Result Date: 12/08/2022 CLINICAL DATA:  Dysphagia, decreasing appetite and weight loss * Tracking Code: BO * EXAM: CT CHEST, ABDOMEN, AND PELVIS WITH CONTRAST TECHNIQUE: Multidetector CT imaging of the chest, abdomen and pelvis was performed following the standard protocol during bolus administration of intravenous contrast. RADIATION DOSE REDUCTION: This exam was performed according to the departmental dose-optimization program which includes automated exposure control, adjustment of the mA and/or kV according to patient size and/or use of iterative reconstruction technique. CONTRAST:  75mL OMNIPAQUE IOHEXOL 300 MG/ML  SOLN COMPARISON:  CT chest, 07/24/2021, abdomen pelvis, 07/27/2021 FINDINGS: CT CHEST FINDINGS Cardiovascular: Scattered aortic atherosclerosis. Normal heart size. Left coronary artery calcifications. Small pericardial effusion. Mediastinum/Nodes: No enlarged mediastinal, hilar, or axillary lymph nodes. Thyroid gland, trachea, and esophagus demonstrate no significant findings. Lungs/Pleura: Diminished ground-glass opacity of the medial left apex measuring 0.7 cm, previously 1.0 cm, nonspecific and infectious or inflammatory requiring no specific further follow-up (series 4, image 37). Mild centrilobular emphysema. Benign, bandlike scarring of the left lung base. No pleural effusion or pneumothorax. Musculoskeletal: No chest wall abnormality. No acute osseous findings. CT ABDOMEN PELVIS FINDINGS Hepatobiliary: Heterogeneous perfusion of the liver. Severe intra hepatic biliary ductal dilatation with a distended gallbladder. 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 Partial cavernous transformation. Pancreas: Very extensive, expansile soft tissue, fat stranding, and cystic change throughout the pancreatic head, encasing multiple adjacent structures including the portal vein and central mesenteric vessels, the superior mesenteric artery, and portions of the descending duodenum (series 2, image 66). Maximum apparent cross-section of the pancreatic head is 6.4 x 5.3 cm (series 2, image 71). Severe atrophy of the distal pancreatic parenchyma as well as diffuse ductal dilatation distally. Multiple fluid collections, including a collection overlying the pancreatic neck and body measuring 6.9 x 5.3 cm (series 2, image 69), and extending inferiorly from the lateral pancreatic head measuring 4.0 x 3.5 cm (series 2, image 77). Spleen: Normal in size without significant abnormality. Adrenals/Urinary Tract: Adenomatous thickening of the adrenal glands, requiring no specific further follow-up or characterization. Kidneys are normal, without renal calculi, solid lesion, or hydronephrosis. Bladder is unremarkable. Stomach/Bowel: Stomach is within normal limits. Appendix appears normal. No evidence of bowel wall thickening, distention, or inflammatory changes. Vascular/Lymphatic: Aortic atherosclerosis. No enlarged abdominal or pelvic lymph nodes. Reproductive: Calcified uterine fibroids. Other: No abdominal wall hernia or abnormality. No ascites. Musculoskeletal: No acute osseous findings. IMPRESSION: 1. Very extensive, expansile soft tissue, fat stranding, and cystic change throughout the pancreatic head, encasing multiple adjacent structures including the portal vein and central mesenteric vessels, the superior mesenteric artery,  and portions of the descending duodenum. Maximum apparent cross-section of the pancreatic head is 6.4 x 5.3 cm. 2. Severe intrahepatic biliary  ductal dilatation with a distended gallbladder. 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 3. Multiple peripancreatic fluid collections, including a collection overlying the pancreatic neck and body measuring 6.9 x 5.3 cm, and extending inferiorly from the lateral pancreatic head measuring 4.0 x 3.5 cm, of uncertain acuity, consistent with acute pancreatic fluid collections or pseudocysts. The presence or absence of infection within this fluid is not established by imaging. 4. Constellation of findings is of uncertain significance, highly concerning for pancreatic adenocarcinoma complicated by pancreatitis, however at least conceivably this could reflect benign, although unusually severe acute on chronic pancreatitis. 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 5. No evidence of lymphadenopathy or metastatic disease in the chest, abdomen, or pelvis. 6. Emphysema. 7. Coronary artery disease. Aortic Atherosclerosis (ICD10-I70.0) and Emphysema (ICD10-J43.9). Electronically Signed   By: Jearld Lesch M.D.   On: 12/08/2022 14:56     Assessment / Plan:   Assessment: 1.  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  Weight loss and fatigue: 30 pounds per patient over the past year 3.  History of alcoholic pancreatitis: no current alcohol use 4.  Hypokalemia/metabolic alkalosis: Corrected overnight 5.  Anxiety and  depression 6.  Emphysema  Plan: 1.  Plan for EUS with FNA today. 2.  Patient will remain n.p.o. until after time of case 3.  CA 19-9 pending  Thank you for your kind consultation, await further recommendations after time procedure today.    LOS: 2 days   Unk Lightning  12/10/2022, 10:30 AM

## 2022-12-10 NOTE — Transfer of Care (Signed)
Immediate Anesthesia Transfer of Care Note  Patient: Kathryn Underwood  Procedure(s) Performed: ESOPHAGOGASTRODUODENOSCOPY (EGD) WITH PROPOFOL BIOPSY  Patient Location: PACU  Anesthesia Type:MAC  Level of Consciousness: sedated  Airway & Oxygen Therapy: Patient Spontanous Breathing and Patient connected to face mask oxygen  Post-op Assessment: Report given to RN and Post -op Vital signs reviewed and stable  Post vital signs: Reviewed and stable  Last Vitals:  Vitals Value Taken Time  BP    Temp    Pulse    Resp    SpO2      Last Pain:  Vitals:   12/10/22 1532  TempSrc: Temporal  PainSc: 0-No pain         Complications: No notable events documented.

## 2022-12-10 NOTE — Progress Notes (Signed)
Progress Note   Subjective  Hospital day #2 Chief Complaint: Pancreatic Mass  Patient tells me she continues to feel ill today.  Apparently the Potassium has "kicked her butt".  Tells me she is nauseous and trying to chew on some ice chips to help.  Aware that her EUS is scheduled later today.    Objective   Vital signs in last 24 hours: Temp:  [98.4 F (36.9 C)-99 F (37.2 C)] 98.4 F (36.9 C) (11/14 0627) Pulse Rate:  [88-99] 88 (11/14 0627) Resp:  [18-20] 20 (11/14 0627) BP: (114-119)/(71-75) 119/75 (11/14 0627) SpO2:  [92 %-95 %] 95 % (11/14 0627) Last BM Date : 12/09/22 General:    Ill-appearing, thin, white female in NAD Heart:  Regular rate and rhythm; no murmurs Lungs: Respirations even and unlabored, lungs CTA bilaterally Abdomen:  Soft, nontender and nondistended. Normal bowel sounds. Psych:  Cooperative. Normal mood and affect.  Intake/Output from previous day: 11/13 0701 - 11/14 0700 In: 668.8 [P.O.:240; I.V.:428.8] Out: -    Lab Results: Recent Labs    12/08/22 1236 12/09/22 0531 12/10/22 0544  WBC 10.1 7.5 6.8  HGB 15.2* 12.4 12.6  HCT 45.3 37.4 39.1  PLT 424* 380 355   BMET Recent Labs    12/09/22 0531 12/09/22 1745 12/10/22 0544  NA 137 138 135  K 2.1* 3.1* 3.2*  CL 94* 96* 102  CO2 34* 33* 26  GLUCOSE 123* 114* 103*  BUN 10 9 5*  CREATININE 0.55 0.47 0.40*  CALCIUM 8.0* 8.3* 7.8*   LFT Recent Labs    12/08/22 1231 12/09/22 0531 12/10/22 0544  PROT 7.0   < > 5.0*  ALBUMIN 3.5   < > 2.4*  AST 26   < > 19  ALT 9   < > 12  ALKPHOS 88   < > 77  BILITOT 0.6   < > 0.5  BILIDIR 0.1  --   --   IBILI 0.5  --   --    < > = values in this interval not displayed.   Studies/Results: MR ABDOMEN MRCP W WO CONTAST  Result Date: 12/09/2022 CLINICAL DATA:  Pancreatitis with a pancreatic mass. EXAM: MRI ABDOMEN WITHOUT AND WITH CONTRAST (INCLUDING MRCP) TECHNIQUE: Multiplanar multisequence MR imaging of the abdomen was performed both  before and after the administration of intravenous contrast. Heavily T2-weighted images of the biliary and pancreatic ducts were obtained, and three-dimensional MRCP images were rendered by post processing. CONTRAST:  5mL GADAVIST GADOBUTROL 1 MMOL/ML IV SOLN COMPARISON:  CT scans including 12/08/2022, 07/27/2021, 07/24/2021 for comparison. MRCP 08/03/2021. FINDINGS: Lower chest: Slight linear signal at the lung bases. Likely scar or atelectasis. Moderate pericardial effusion again identified. Hepatobiliary: There are geographic areas of signal drop on out of phase imaging in the liver particularly involving segments 5, 6 and 4 consistent with a geographic areas of fatty infiltration. There is mosaic appearance to enhancement of the liver in the arterial phase as well. As seen on prior CT there is thrombus within the main portal vein with several upper abdominal collateral vessels. Separate multiple hepatic cysts identified which are predominantly under a cm in size. On series 27, image 11 in the dome of the right hepatic lobe segment 8 is a small enhancing nodule measuring 10 mm. This has mildly bright on T2 precontrast. This has an indeterminate lesion. There is moderate intrahepatic biliary ductal dilatation diffusely. The common hepatic duct has a diameter of 15 mm proximally with focal  high-grade stricture over a long segment of the common duct extending down to the pancreatic head and ampulla. Gallbladder is dilated with layering sludge and possible stones. Pancreas: Dysmorphic appearance to the pancreas. They are smaller areas of residual pancreatic normal enhancement in the body and head/uncinate region. Centered along the neck and head region is a spiculated heterogeneous mass as seen on CT scan showing restricted diffusion. This has mass somewhat difficult to measure with the surrounding extensive other abnormality but is estimated in dimension on series 27, image 50 4.9 x 5.5 cm. This extends caudal to  the pancreas into the central mesentery and is associated with occlusion of the splenic vein. There is encasement of the splenic artery. Extensive central mesenteric collaterals identified. There are several areas which has poor enhancement along the tail as well which could be the sequela of additional history of pancreatitis. The fluid collection extending anterior to the pancreas along the lesser sac on today's examination measures 6.0 by 5.2 cm. There are several other smaller collections identified adjacent to this larger. There is also a significant fluid collection in the central mesentery just caudal to the head of the pancreas on series 26, image 25 measuring 5.0 by 3.1 cm. In principle, these has a differential but easily could be pseudocysts. No associated nodular wall enhancement. Spleen:  Within normal limits in size and appearance. Adrenals/Urinary Tract: No masses identified. No evidence of hydronephrosis. Stomach/Bowel: Debris noted in the nondilated stomach. The visualized small and large bowel are nondilated. Scattered colonic stool. Vascular/Lymphatic: Atherosclerotic changes identified. Normal caliber aorta and IVC. 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 The aforementioned mass lesion does appear to encase the SMA with some narrowing. Other:  Mild ascites. Musculoskeletal: Mild degenerative changes along the spine. IMPRESSION: Large locally aggressive heterogeneous soft tissue mass with spiculations identified centered of the neck and head of the pancreas. Associated involvement of the SMA and SMV with the occlusion of the SMV and narrowing of the SMA. 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 Worrisome for pancreatic neoplasm or adenocarcinoma. Relatively poor visualization of the rest of the pancreas with the areas of distortion poor enhancement.  This could be the sequela of neoplasm in the additional changes of pancreatitis. Several fluid collections are identified which could represent pseudo cysts with the largest extending along the lesser sac measuring up to 6 cm. Additional dominant collection seen caudal to the pancreatic head. 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 The gallbladder is also dilated with layering sludge and or stones. Multiple cystic liver lesions identified. There is 1 enhancing lesion in the dome in segment 8 which is indeterminate. With the pancreatic findings this could be spread of disease but would has a differential recommend attention on short follow-up. Geographic areas of fatty liver infiltration which can relate to the hepatic blood flow abnormalities. Electronically Signed   By: Karen Kays M.D.   On: 12/09/2022 14:48   MR 3D Recon At Scanner  Result Date: 12/09/2022 CLINICAL DATA:  Pancreatitis with a pancreatic mass. EXAM: MRI ABDOMEN WITHOUT AND WITH CONTRAST (INCLUDING MRCP) TECHNIQUE: Multiplanar multisequence MR imaging of the abdomen was performed both before and after the administration of intravenous contrast. Heavily T2-weighted images of the biliary and pancreatic ducts were obtained, and three-dimensional MRCP  images were rendered by post processing. CONTRAST:  5mL GADAVIST GADOBUTROL 1 MMOL/ML IV SOLN COMPARISON:  CT scans including 12/08/2022, 07/27/2021, 07/24/2021 for comparison. MRCP 08/03/2021. FINDINGS: Lower chest: Slight linear signal at the lung bases. Likely scar or atelectasis. Moderate pericardial effusion again identified. Hepatobiliary: There are geographic areas of signal drop on out of phase imaging in the liver particularly involving segments 5, 6 and 4 consistent with a geographic areas of fatty infiltration. There is mosaic appearance to  enhancement of the liver in the arterial phase as well. As seen on prior CT there is thrombus within the main portal vein with several upper abdominal collateral vessels. Separate multiple hepatic cysts identified which are predominantly under a cm in size. On series 27, image 11 in the dome of the right hepatic lobe segment 8 is a small enhancing nodule measuring 10 mm. This has mildly bright on T2 precontrast. This has an indeterminate lesion. There is moderate intrahepatic biliary ductal dilatation diffusely. The common hepatic duct has a diameter of 15 mm proximally with focal high-grade stricture over a long segment of the common duct extending down to the pancreatic head and ampulla. Gallbladder is dilated with layering sludge and possible stones. Pancreas: Dysmorphic appearance to the pancreas. They are smaller areas of residual pancreatic normal enhancement in the body and head/uncinate region. Centered along the neck and head region is a spiculated heterogeneous mass as seen on CT scan showing restricted diffusion. This has mass somewhat difficult to measure with the surrounding extensive other abnormality but is estimated in dimension on series 27, image 50 4.9 x 5.5 cm. This extends caudal to the pancreas into the central mesentery and is associated with occlusion of the splenic vein. There is encasement of the splenic artery. Extensive central mesenteric collaterals identified. There are several areas which has poor enhancement along the tail as well which could be the sequela of additional history of pancreatitis. The fluid collection extending anterior to the pancreas along the lesser sac on today's examination measures 6.0 by 5.2 cm. There are several other smaller collections identified adjacent to this larger. There is also a significant fluid collection in the central mesentery just caudal to the head of the pancreas on series 26, image 25 measuring 5.0 by 3.1 cm. In principle, these has a  differential but easily could be pseudocysts. No associated nodular wall enhancement. Spleen:  Within normal limits in size and appearance. Adrenals/Urinary Tract: No masses identified. No evidence of hydronephrosis. Stomach/Bowel: Debris noted in the nondilated stomach. The visualized small and large bowel are nondilated. Scattered colonic stool. Vascular/Lymphatic: Atherosclerotic changes identified. Normal caliber aorta and IVC. 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 The aforementioned mass lesion does appear to encase the SMA with some narrowing. Other:  Mild ascites. Musculoskeletal: Mild degenerative changes along the spine. IMPRESSION: Large locally aggressive heterogeneous soft tissue mass with spiculations identified centered of the neck and head of the pancreas. Associated involvement of the SMA and SMV with the occlusion of the SMV and narrowing of the SMA. 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 Worrisome for pancreatic neoplasm or adenocarcinoma. Relatively poor visualization of the rest of the pancreas with the areas of distortion poor enhancement. This could be the sequela of neoplasm in the additional changes of pancreatitis. Several fluid collections are identified which could represent pseudo cysts with the largest extending along the lesser  sac measuring up to 6 cm. Additional dominant collection seen caudal to the pancreatic head. 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 The gallbladder is also dilated with layering sludge and or stones. Multiple cystic liver lesions identified. There is 1 enhancing lesion in the dome in segment 8 which is indeterminate. With the pancreatic findings this could be spread  of disease but would has a differential recommend attention on short follow-up. Geographic areas of fatty liver infiltration which can relate to the hepatic blood flow abnormalities. Electronically Signed   By: Karen Kays M.D.   On: 12/09/2022 14:48   CT CHEST ABDOMEN PELVIS W CONTRAST  Result Date: 12/08/2022 CLINICAL DATA:  Dysphagia, decreasing appetite and weight loss * Tracking Code: BO * EXAM: CT CHEST, ABDOMEN, AND PELVIS WITH CONTRAST TECHNIQUE: Multidetector CT imaging of the chest, abdomen and pelvis was performed following the standard protocol during bolus administration of intravenous contrast. RADIATION DOSE REDUCTION: This exam was performed according to the departmental dose-optimization program which includes automated exposure control, adjustment of the mA and/or kV according to patient size and/or use of iterative reconstruction technique. CONTRAST:  75mL OMNIPAQUE IOHEXOL 300 MG/ML  SOLN COMPARISON:  CT chest, 07/24/2021, abdomen pelvis, 07/27/2021 FINDINGS: CT CHEST FINDINGS Cardiovascular: Scattered aortic atherosclerosis. Normal heart size. Left coronary artery calcifications. Small pericardial effusion. Mediastinum/Nodes: No enlarged mediastinal, hilar, or axillary lymph nodes. Thyroid gland, trachea, and esophagus demonstrate no significant findings. Lungs/Pleura: Diminished ground-glass opacity of the medial left apex measuring 0.7 cm, previously 1.0 cm, nonspecific and infectious or inflammatory requiring no specific further follow-up (series 4, image 37). Mild centrilobular emphysema. Benign, bandlike scarring of the left lung base. No pleural effusion or pneumothorax. Musculoskeletal: No chest wall abnormality. No acute osseous findings. CT ABDOMEN PELVIS FINDINGS Hepatobiliary: Heterogeneous perfusion of the liver. Severe intra hepatic biliary ductal dilatation with a distended gallbladder. 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 Partial cavernous transformation. Pancreas: Very extensive, expansile soft tissue, fat stranding, and cystic change throughout the pancreatic head, encasing multiple adjacent structures including the portal vein and central mesenteric vessels, the superior mesenteric artery, and portions of the descending duodenum (series 2, image 66). Maximum apparent cross-section of the pancreatic head is 6.4 x 5.3 cm (series 2, image 71). Severe atrophy of the distal pancreatic parenchyma as well as diffuse ductal dilatation distally. Multiple fluid collections, including a collection overlying the pancreatic neck and body measuring 6.9 x 5.3 cm (series 2, image 69), and extending inferiorly from the lateral pancreatic head measuring 4.0 x 3.5 cm (series 2, image 77). Spleen: Normal in size without significant abnormality. Adrenals/Urinary Tract: Adenomatous thickening of the adrenal glands, requiring no specific further follow-up or characterization. Kidneys are normal, without renal calculi, solid lesion, or hydronephrosis. Bladder is unremarkable. Stomach/Bowel: Stomach is within normal limits. Appendix appears normal. No evidence of bowel wall thickening, distention, or inflammatory changes. Vascular/Lymphatic: Aortic atherosclerosis. No enlarged abdominal or pelvic lymph nodes. Reproductive: Calcified uterine fibroids. Other: No abdominal wall hernia or abnormality. No ascites. Musculoskeletal: No acute osseous findings. IMPRESSION: 1. Very extensive, expansile soft tissue, fat stranding, and cystic change throughout the pancreatic head, encasing multiple adjacent structures including the portal vein and central mesenteric vessels, the superior mesenteric artery,  and portions of the descending duodenum. Maximum apparent cross-section of the pancreatic head is 6.4 x 5.3 cm. 2. Severe intrahepatic biliary  ductal dilatation with a distended gallbladder. 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 3. Multiple peripancreatic fluid collections, including a collection overlying the pancreatic neck and body measuring 6.9 x 5.3 cm, and extending inferiorly from the lateral pancreatic head measuring 4.0 x 3.5 cm, of uncertain acuity, consistent with acute pancreatic fluid collections or pseudocysts. The presence or absence of infection within this fluid is not established by imaging. 4. Constellation of findings is of uncertain significance, highly concerning for pancreatic adenocarcinoma complicated by pancreatitis, however at least conceivably this could reflect benign, although unusually severe acute on chronic pancreatitis. 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 5. No evidence of lymphadenopathy or metastatic disease in the chest, abdomen, or pelvis. 6. Emphysema. 7. Coronary artery disease. Aortic Atherosclerosis (ICD10-I70.0) and Emphysema (ICD10-J43.9). Electronically Signed   By: Jearld Lesch M.D.   On: 12/08/2022 14:56     Assessment / Plan:   Assessment: 1.  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  Weight loss and fatigue: 30 pounds per patient over the past year 3.  History of alcoholic pancreatitis: no current alcohol use 4.  Hypokalemia/metabolic alkalosis: Corrected overnight 5.  Anxiety and  depression 6.  Emphysema  Plan: 1.  Plan for EUS with FNA today. 2.  Patient will remain n.p.o. until after time of case 3.  CA 19-9 pending  Thank you for your kind consultation, await further recommendations after time procedure today.    LOS: 2 days   Unk Lightning  12/10/2022, 10:30 AM

## 2022-12-10 NOTE — Progress Notes (Signed)
PROGRESS NOTE  Kathryn Underwood VWU:[id number] DOB: 09-03-1960   PCP: Bary Leriche, PA-C  Patient is from: Home.  DOA: 12/08/2022 LOS: 2  Chief complaints Chief Complaint  Patient presents with   Weight Loss   Weakness     Brief Narrative / Interim history: 62 year old F with PMH of EtOH pancreatitis with pseudocyst, alcohol use disorder, neuropathy, anxiety, depression, GERD and tobacco use disorder presenting with decreased oral intake, fatigue and unintentional weight loss for about 2 months and found to have pancreatic mass on imaging.  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  Plan for EUS on 11/14.  Subjective: Seen and examined earlier this morning.  No major events overnight of this morning.  No specific complaints but understandably scared and anxious.   Objective: Vitals:   12/09/22 0448 12/09/22 1327 12/10/22 0006 12/10/22 0627  BP: (!) 94/52 115/71 114/71 119/75  Pulse: 87 93 99 88  Resp: 18 20 18 20   Temp: 98.4 F (36.9 C) 99 F (37.2 C) 98.6 F (37 C) 98.4 F (36.9 C)  TempSrc: Oral Oral Oral Oral  SpO2: 93% 92% 95% 95%  Weight:      Height:        Examination:  GENERAL: No apparent distress.  Nontoxic. HEENT: MMM.  Vision and hearing grossly intact.  NECK: Supple.  No apparent JVD.  RESP:  No IWOB.  Fair aeration bilaterally. CVS:  RRR. Heart sounds normal.  ABD/GI/GU: BS+. Abd soft, NTND.  MSK/EXT:  Moves extremities. No apparent deformity. No edema.  SKIN: no apparent skin lesion or wound NEURO: Awake, alert and oriented appropriately.  No apparent focal neuro deficit. PSYCH: Somewhat anxious and tearful.  Procedures:  None  Microbiology summarized: None  Assessment and plan: New pancreatic head mass: Patient presented with unintentional weight loss, poor p.o. intake and fatigue.  CT and MRCP raising concern for large locally aggressive pancreatic  neoplasm/adenocarcinoma with vascular and biliary involvement -GI and oncology consulted and following -GI planning EUS today. -Follow CA 19-9   Hx alcoholic pancreatitis with pseudocyst: CT abdomen and pelvis as above.  Patient without significant abdominal pain, nausea or vomiting.  No significant elevation of lipase or LFT.  CT and MRI as above.  Profound hypokalemia: Improved -P.o. KCl 40 x 2  Metabolic alkalosis: Likely from GI loss.  Resolved.   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 -Counseled on smoking cessation -Not interested in nicotine patch   Emphysema: No respiratory symptoms -Encouraged smoking cessation -As needed DuoNebs  Unintentional weight loss/poor p.o. intake.  Likely due to #1 Body mass index is 19.49 kg/m. Nutrition Problem: Severe Malnutrition Etiology: chronic illness (pancreatitis) Signs/Symptoms: energy intake < or equal to 75% for > or equal to 1 month, severe fat depletion, moderate muscle depletion, percent weight loss Interventions: Boost Breeze, MVI, Education   DVT prophylaxis:  enoxaparin (LOVENOX) injection 40 mg Start: 12/08/22 2200  Code Status: Full code Family Communication: None at bedside Level of care: Telemetry Status is: Inpatient Remains inpatient appropriate because: Pancreatic head mass concerning for adenocarcinoma, possible pancreatitis and electrolyte arrangement   Final disposition: Likely home Consultants:  Gastroenterology Oncology  55 minutes with more than 50% spent in reviewing records, counseling patient/family and coordinating care.   Sch Meds:  Scheduled Meds:  enoxaparin (LOVENOX) injection  40 mg Subcutaneous Q24H   feeding supplement  1 Container Oral TID BM   gabapentin  900 mg Oral QHS   mirtazapine  45 mg Oral QHS   pantoprazole  40 mg Oral  Daily   QUEtiapine  100-200 mg Oral QHS   Continuous Infusions:   PRN Meds:.acetaminophen **OR** acetaminophen, alum & mag hydroxide-simeth, ipratropium-albuterol, ondansetron **OR** ondansetron (ZOFRAN) IV, senna-docusate  Antimicrobials: Anti-infectives (From admission, onward)    None        I have personally reviewed the following labs and images: CBC: Recent Labs  Lab 12/08/22 1236 12/09/22 0531 12/10/22 0544  WBC 10.1 7.5 6.8  HGB 15.2* 12.4 12.6  HCT 45.3 37.4 39.1  MCV 98.9 101.6* 102.9*  PLT 424* 380 355   BMP &GFR Recent Labs  Lab 12/08/22 1236 12/09/22 0531 12/09/22 1745 12/10/22 0544  NA 135 137 138 135  K 3.4* 2.1* 3.1* 3.2*  CL 87* 94* 96* 102  CO2 37* 34* 33* 26  GLUCOSE 131* 123* 114* 103*  BUN 11 10 9  5*  CREATININE 0.60 0.55 0.47 0.40*  CALCIUM 9.0 8.0* 8.3* 7.8*  MG  --  2.1 2.1 2.2   Estimated Creatinine Clearance: 56.7 mL/min (A) (by C-G formula based on SCr of 0.4 mg/dL (L)). Liver & Pancreas: Recent Labs  Lab 12/08/22 1231 12/09/22 0531 12/10/22 0544  AST 26 18 19   ALT 9 12 12   ALKPHOS 88 67 77  BILITOT 0.6 0.3 0.5  PROT 7.0 5.1* 5.0*  ALBUMIN 3.5 2.5* 2.4*   Recent Labs  Lab 12/08/22 1231  LIPASE 57*  AMYLASE 42   No results for input(s): "AMMONIA" in the last 168 hours. Diabetic: No results for input(s): "HGBA1C" in the last 72 hours. Recent Labs  Lab 12/09/22 1151 12/09/22 1737 12/10/22 0005 12/10/22 0625 12/10/22 1228  GLUCAP 117* 98 103* 102* 118*   Cardiac Enzymes: No results for input(s): "CKTOTAL", "CKMB", "CKMBINDEX", "TROPONINI" in the last 168 hours. No results for input(s): "PROBNP" in the last 8760 hours. Coagulation Profile: No results for input(s): "INR", "PROTIME" in the last 168 hours. Thyroid Function Tests: No results for input(s): "TSH", "T4TOTAL", "FREET4", "T3FREE", "THYROIDAB" in the last 72 hours. Lipid Profile: No results for input(s): "CHOL", "HDL", "LDLCALC", "TRIG", "CHOLHDL",  "LDLDIRECT" in the last 72 hours. Anemia Panel: No results for input(s): "VITAMINB12", "FOLATE", "FERRITIN", "TIBC", "IRON", "RETICCTPCT" in the last 72 hours. Urine analysis:    Component Value Date/Time   COLORURINE YELLOW 07/24/2021 0827   APPEARANCEUR HAZY (A) 07/24/2021 0827   LABSPEC 1.019 07/24/2021 0827   PHURINE 5.0 07/24/2021 0827   GLUCOSEU NEGATIVE 07/24/2021 0827   HGBUR NEGATIVE 07/24/2021 0827   BILIRUBINUR NEGATIVE 07/24/2021 0827   KETONESUR NEGATIVE 07/24/2021 0827   PROTEINUR TRACE (A) 07/24/2021 0827   UROBILINOGEN 0.2 01/23/2010 1750   NITRITE NEGATIVE 07/24/2021 0827   LEUKOCYTESUR NEGATIVE 07/24/2021 0827   Sepsis Labs: Invalid input(s): "PROCALCITONIN", "LACTICIDVEN"  Microbiology: No results found for this or any previous visit (from the past 240 hour(s)).  Radiology Studies: No results found.    Haisley Arens T. Bessye Stith [facility]  If 7PM-7AM, please contact night-coverage [email or web address] 12/10/2022, 1:22 PM

## 2022-12-10 NOTE — Plan of Care (Signed)

## 2022-12-11 DIAGNOSIS — K8689 Other specified diseases of pancreas: Secondary | ICD-10-CM | POA: Diagnosis not present

## 2022-12-11 DIAGNOSIS — R933 Abnormal findings on diagnostic imaging of other parts of digestive tract: Secondary | ICD-10-CM | POA: Diagnosis not present

## 2022-12-11 DIAGNOSIS — K860 Alcohol-induced chronic pancreatitis: Secondary | ICD-10-CM | POA: Diagnosis not present

## 2022-12-11 LAB — COMPREHENSIVE METABOLIC PANEL
ALT: 21 U/L (ref 0–44)
AST: 38 U/L (ref 15–41)
Albumin: 2.7 g/dL — ABNORMAL LOW (ref 3.5–5.0)
Alkaline Phosphatase: 231 U/L — ABNORMAL HIGH (ref 38–126)
Anion gap: 7 (ref 5–15)
BUN: 6 mg/dL — ABNORMAL LOW (ref 8–23)
CO2: 27 mmol/L (ref 22–32)
Calcium: 8.6 mg/dL — ABNORMAL LOW (ref 8.9–10.3)
Chloride: 102 mmol/L (ref 98–111)
Creatinine, Ser: 0.43 mg/dL — ABNORMAL LOW (ref 0.44–1.00)
GFR, Estimated: >60 mL/min (ref >60)
Glucose, Bld: 119 mg/dL — ABNORMAL HIGH (ref 70–99)
Potassium: 4.2 mmol/L (ref 3.5–5.1)
Sodium: 136 mmol/L (ref 135–145)
Total Bilirubin: 0.4 mg/dL (ref <1.2)
Total Protein: 5.8 g/dL — ABNORMAL LOW (ref 6.5–8.1)

## 2022-12-11 LAB — GLUCOSE, CAPILLARY
Glucose-Capillary: 108 mg/dL — ABNORMAL HIGH (ref 70–99)
Glucose-Capillary: 112 mg/dL — ABNORMAL HIGH (ref 70–99)
Glucose-Capillary: 115 mg/dL — ABNORMAL HIGH (ref 70–99)
Glucose-Capillary: 133 mg/dL — ABNORMAL HIGH (ref 70–99)
Glucose-Capillary: 140 mg/dL — ABNORMAL HIGH (ref 70–99)

## 2022-12-11 LAB — CBC
HCT: 40.2 % (ref 36.0–46.0)
Hemoglobin: 13.5 g/dL (ref 12.0–15.0)
MCH: 33.8 pg (ref 26.0–34.0)
MCHC: 33.6 g/dL (ref 30.0–36.0)
MCV: 100.8 fL — ABNORMAL HIGH (ref 80.0–100.0)
Platelets: 397 10*3/uL (ref 150–400)
RBC: 3.99 MIL/uL (ref 3.87–5.11)
RDW: 12.9 % (ref 11.5–15.5)
WBC: 7.6 10*3/uL (ref 4.0–10.5)
nRBC: 0 % (ref 0.0–0.2)

## 2022-12-11 LAB — MAGNESIUM: Magnesium: 2.3 mg/dL (ref 1.7–2.4)

## 2022-12-11 LAB — CANCER ANTIGEN 19-9: CA 19-9: 311 U/mL — ABNORMAL HIGH (ref 0–35)

## 2022-12-11 LAB — LIPASE, BLOOD: Lipase: 36 U/L (ref 11–51)

## 2022-12-11 MED ORDER — ALUM & MAG HYDROXIDE-SIMETH 200-200-20 MG/5ML PO SUSP
30.0000 mL | ORAL | Status: DC | PRN
  Administered 2022-12-11 – 2022-12-18 (×7): 30 mL via ORAL
  Filled 2022-12-11 (×7): qty 30

## 2022-12-11 NOTE — Plan of Care (Signed)

## 2022-12-11 NOTE — H&P (View-Only) (Signed)
    Progress Note   Subjective  Hospital day #3 Chief Complaint: Pancreatic Mass  Today, patient is in better spirits.  Apparently she was able to have a good bowel movement today and feels completely emptied which helped.  Tolerating clear liquid diet.  Aware of plans for possible EUS repeat tomorrow.  Does feel like the Reglan has helped.   Objective   Vital signs in last 24 hours: Temp:  [97.3 F (36.3 C)-99.2 F (37.3 C)] 99.2 F (37.3 C) (11/15 0539) Pulse Rate:  [96-104] 104 (11/15 0539) Resp:  [18-24] 18 (11/15 0539) BP: (100-133)/(65-88) 103/77 (11/15 0539) SpO2:  [94 %-100 %] 95 % (11/15 0539) Last BM Date : (S) 12/11/22 General:    White, thin appearing, female in NAD Heart:  Regular rate and rhythm; no murmurs Lungs: Respirations even and unlabored, lungs CTA bilaterally Abdomen:  Soft, nontender and nondistended. Normal bowel sounds. Psych:  Cooperative. Normal mood and affect.  Intake/Output from previous day: 11/14 0701 - 11/15 0700 In: 100 [I.V.:100] Out: -   Lab Results: Recent Labs    12/09/22 0531 12/10/22 0544 12/11/22 0552  WBC 7.5 6.8 7.6  HGB 12.4 12.6 13.5  HCT 37.4 39.1 40.2  PLT 380 355 397   BMET Recent Labs    12/09/22 1745 12/10/22 0544 12/11/22 0552  NA 138 135 136  K 3.1* 3.2* 4.2  CL 96* 102 102  CO2 33* 26 27  GLUCOSE 114* 103* 119*  BUN 9 5* 6*  CREATININE 0.47 0.40* 0.43*  CALCIUM 8.3* 7.8* 8.6*   LFT Recent Labs    12/08/22 1231 12/09/22 0531 12/11/22 0552  PROT 7.0   < > 5.8*  ALBUMIN 3.5   < > 2.7*  AST 26   < > 38  ALT 9   < > 21  ALKPHOS 88   < > 231*  BILITOT 0.6   < > 0.4  BILIDIR 0.1  --   --   IBILI 0.5  --   --    < > = values in this interval not displayed.    Assessment / Plan:   Assessment: 1.  Abnormal CT of the pancreas: Showing new pancreatic mass and acute on chronic pancreatitis with multiple pseudocysts, previous evaluation with EUS in July of last year with no sign of cancer, MRCP with  similar findings, LFTs normal, CA 19-9 normal at 13 2.  Weight loss and fatigue: 30 pounds per patient over the past year 3.  History of alcoholic pancreatitis: No current alcohol use 4.  Anxiety and depression 5.  Emphysema 6.  Hypokalemia: Drop slightly overnight  Plan: 1.  Plan for repeat EUS with FNA tomorrow, hopefully the Reglan has helped clear foodstuffs from her stomach.  Did discuss that this could be delayed pending anesthesia availability. 2.  Patient can remain on current diet will be n.p.o. at midnight in case of procedure 3.  Hospitalist correction of potassium  Thank you for your kind consultation, we will continue to follow.   LOS: 3 days   Unk Lightning  12/11/2022, 10:42 AM

## 2022-12-11 NOTE — Anesthesia Preprocedure Evaluation (Signed)
Anesthesia Evaluation  Patient identified by MRN, date of birth, ID band Patient awake    Reviewed: Allergy & Precautions, NPO status , Patient's Chart, lab work & pertinent test results  Airway Mallampati: I  TM Distance: >3 FB Neck ROM: Full    Dental  (+) Teeth Intact, Dental Advisory Given   Pulmonary COPD, Current Smoker and Patient abstained from smoking.   breath sounds clear to auscultation       Cardiovascular  Rhythm:Regular Rate:Tachycardia     Neuro/Psych  PSYCHIATRIC DISORDERS Anxiety Depression       GI/Hepatic negative GI ROS, Neg liver ROS,,,  Endo/Other  negative endocrine ROS    Renal/GU negative Renal ROS     Musculoskeletal   Abdominal   Peds  Hematology negative hematology ROS (+)   Anesthesia Other Findings   Reproductive/Obstetrics                             Anesthesia Physical Anesthesia Plan  ASA: 2  Anesthesia Plan: MAC   Post-op Pain Management:    Induction: Intravenous  PONV Risk Score and Plan: 0 and Propofol infusion  Airway Management Planned: Natural Airway  Additional Equipment: None  Intra-op Plan:   Post-operative Plan:   Informed Consent: I have reviewed the patients History and Physical, chart, labs and discussed the procedure including the risks, benefits and alternatives for the proposed anesthesia with the patient or authorized representative who has indicated his/her understanding and acceptance.       Plan Discussed with: CRNA  Anesthesia Plan Comments:        Anesthesia Quick Evaluation

## 2022-12-11 NOTE — Progress Notes (Signed)
    Progress Note   Subjective  Hospital day #3 Chief Complaint: Pancreatic Mass  Today, patient is in better spirits.  Apparently she was able to have a good bowel movement today and feels completely emptied which helped.  Tolerating clear liquid diet.  Aware of plans for possible EUS repeat tomorrow.  Does feel like the Reglan has helped.   Objective   Vital signs in last 24 hours: Temp:  [97.3 F (36.3 C)-99.2 F (37.3 C)] 99.2 F (37.3 C) (11/15 0539) Pulse Rate:  [96-104] 104 (11/15 0539) Resp:  [18-24] 18 (11/15 0539) BP: (100-133)/(65-88) 103/77 (11/15 0539) SpO2:  [94 %-100 %] 95 % (11/15 0539) Last BM Date : (S) 12/11/22 General:    White, thin appearing, female in NAD Heart:  Regular rate and rhythm; no murmurs Lungs: Respirations even and unlabored, lungs CTA bilaterally Abdomen:  Soft, nontender and nondistended. Normal bowel sounds. Psych:  Cooperative. Normal mood and affect.  Intake/Output from previous day: 11/14 0701 - 11/15 0700 In: 100 [I.V.:100] Out: -   Lab Results: Recent Labs    12/09/22 0531 12/10/22 0544 12/11/22 0552  WBC 7.5 6.8 7.6  HGB 12.4 12.6 13.5  HCT 37.4 39.1 40.2  PLT 380 355 397   BMET Recent Labs    12/09/22 1745 12/10/22 0544 12/11/22 0552  NA 138 135 136  K 3.1* 3.2* 4.2  CL 96* 102 102  CO2 33* 26 27  GLUCOSE 114* 103* 119*  BUN 9 5* 6*  CREATININE 0.47 0.40* 0.43*  CALCIUM 8.3* 7.8* 8.6*   LFT Recent Labs    12/08/22 1231 12/09/22 0531 12/11/22 0552  PROT 7.0   < > 5.8*  ALBUMIN 3.5   < > 2.7*  AST 26   < > 38  ALT 9   < > 21  ALKPHOS 88   < > 231*  BILITOT 0.6   < > 0.4  BILIDIR 0.1  --   --   IBILI 0.5  --   --    < > = values in this interval not displayed.    Assessment / Plan:   Assessment: 1.  Abnormal CT of the pancreas: Showing new pancreatic mass and acute on chronic pancreatitis with multiple pseudocysts, previous evaluation with EUS in July of last year with no sign of cancer, MRCP with  similar findings, LFTs normal, CA 19-9 normal at 13 2.  Weight loss and fatigue: 30 pounds per patient over the past year 3.  History of alcoholic pancreatitis: No current alcohol use 4.  Anxiety and depression 5.  Emphysema 6.  Hypokalemia: Drop slightly overnight  Plan: 1.  Plan for repeat EUS with FNA tomorrow, hopefully the Reglan has helped clear foodstuffs from her stomach.  Did discuss that this could be delayed pending anesthesia availability. 2.  Patient can remain on current diet will be n.p.o. at midnight in case of procedure 3.  Hospitalist correction of potassium  Thank you for your kind consultation, we will continue to follow.   LOS: 3 days   Unk Lightning  12/11/2022, 10:42 AM

## 2022-12-11 NOTE — Progress Notes (Signed)
PROGRESS NOTE  Kathryn Underwood DGU:[id number] DOB: 1960/10/11   PCP: Bary Leriche, PA-C  Patient is from: Home.  DOA: 12/08/2022 LOS: 3  Chief complaints Chief Complaint  Patient presents with   Weight Loss   Weakness     Brief Narrative / Interim history: 62 year old F with PMH of EtOH pancreatitis with pseudocyst, alcohol use disorder, neuropathy, anxiety, depression, GERD and tobacco use disorder presenting with decreased oral intake, fatigue and unintentional weight loss for about 2 months and found to have pancreatic mass on imaging.  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  EUS not successful on 11/14 due to foodstuff in stomach. Plan for EUS with FNA on 11/16  Subjective: Seen and examined earlier this morning.  No major events overnight or this morning.  No complaints apart from not being able to sleep well.  Objective: Vitals:   12/10/22 1640 12/10/22 1650 12/10/22 2134 12/11/22 0539  BP: 100/65 111/65 132/88 103/77  Pulse: (!) 101 96 98 (!) 104  Resp: (!) 21 (!) 21 20 18   Temp:   98.3 F (36.8 C) 99.2 F (37.3 C)  TempSrc:   Oral Oral  SpO2: 95% 95% 98% 95%  Weight:      Height:        Examination:  GENERAL: No apparent distress.  Nontoxic. HEENT: MMM.  Vision and hearing grossly intact.  NECK: Supple.  No apparent JVD.  RESP:  No IWOB.  Fair aeration bilaterally. CVS:  RRR. Heart sounds normal.  ABD/GI/GU: BS+. Abd soft, NTND.  MSK/EXT:  Moves extremities. No apparent deformity. No edema.  SKIN: no apparent skin lesion or wound NEURO: Awake, alert and oriented appropriately.  No apparent focal neuro deficit. PSYCH: Somewhat anxious and tearful.  Procedures:  11/14-EUS not successful due to food stuff in his stomach.  Microbiology summarized: None  Assessment and plan: New pancreatic head mass: Patient presented with unintentional weight loss, poor p.o.  intake and fatigue.  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  CA 19-9 normal at 13. -GI and oncology consulted and following -GI planning EUS on 11/16 after Reglan.  Unsuccessful on 11/14. -Continue full liquid diet and Reglan per GI   Hx alcoholic pancreatitis with pseudocyst: CT abdomen and pelvis as above.  Patient without significant abdominal pain, nausea or vomiting.  No significant elevation of lipase or LFT.  CT and MRI as above.  Profound hypokalemia: Resolved.  Metabolic alkalosis: Likely from GI loss.  Resolved.   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 -Counseled on smoking cessation -Not interested in nicotine patch   Emphysema: No respiratory symptoms -Encouraged smoking cessation -As needed DuoNebs  Elevated alkaline phosphatase: Trended from 60s to 231 this morning.  Other LFT and bilirubin within normal. -Continue monitoring  Unintentional weight loss/poor p.o. intake.  Likely due to #1 Body mass index is 19.49 kg/m. Nutrition Problem: Severe Malnutrition Etiology: chronic illness (pancreatitis) Signs/Symptoms: energy intake < or equal to 75% for > or equal to 1 month, severe fat depletion, moderate muscle depletion, percent weight loss Interventions: Boost Breeze, MVI, Education   DVT prophylaxis:  enoxaparin (LOVENOX) injection 40 mg Start: 12/08/22 2200  Code Status: Full code Family Communication: None at bedside Level of care: Telemetry Status is: Inpatient Remains  inpatient appropriate because: Pancreatic head mass concerning for adenocarcinoma, possible pancreatitis and electrolyte arrangement   Final disposition: Likely home Consultants:  Gastroenterology Oncology  35 minutes with more than 50% spent  in reviewing records, counseling patient/family and coordinating care.   Sch Meds:  Scheduled Meds:  enoxaparin (LOVENOX) injection  40 mg Subcutaneous Q24H   feeding supplement  1 Container Oral TID BM   gabapentin  900 mg Oral QHS   metoCLOPramide (REGLAN) injection  10 mg Intravenous BID   mirtazapine  45 mg Oral QHS   pantoprazole  40 mg Oral BID   QUEtiapine  100-200 mg Oral QHS   sucralfate  1 g Oral TID WC & HS   Continuous Infusions:   PRN Meds:.acetaminophen **OR** acetaminophen, alum & mag hydroxide-simeth, ipratropium-albuterol, ondansetron **OR** ondansetron (ZOFRAN) IV, senna-docusate  Antimicrobials: Anti-infectives (From admission, onward)    None        I have personally reviewed the following labs and images: CBC: Recent Labs  Lab 12/08/22 1236 12/09/22 0531 12/10/22 0544 12/11/22 0552  WBC 10.1 7.5 6.8 7.6  HGB 15.2* 12.4 12.6 13.5  HCT 45.3 37.4 39.1 40.2  MCV 98.9 101.6* 102.9* 100.8*  PLT 424* 380 355 397   BMP &GFR Recent Labs  Lab 12/08/22 1236 12/09/22 0531 12/09/22 1745 12/10/22 0544 12/11/22 0552  NA 135 137 138 135 136  K 3.4* 2.1* 3.1* 3.2* 4.2  CL 87* 94* 96* 102 102  CO2 37* 34* 33* 26 27  GLUCOSE 131* 123* 114* 103* 119*  BUN 11 10 9  5* 6*  CREATININE 0.60 0.55 0.47 0.40* 0.43*  CALCIUM 9.0 8.0* 8.3* 7.8* 8.6*  MG  --  2.1 2.1 2.2 2.3   Estimated Creatinine Clearance: 56.7 mL/min (A) (by C-G formula based on SCr of 0.43 mg/dL (L)). Liver & Pancreas: Recent Labs  Lab 12/08/22 1231 12/09/22 0531 12/10/22 0544 12/11/22 0552  AST 26 18 19  38  ALT 9 12 12 21   ALKPHOS 88 67 77 231*  BILITOT 0.6 0.3 0.5 0.4  PROT 7.0 5.1* 5.0* 5.8*  ALBUMIN 3.5 2.5* 2.4* 2.7*   Recent Labs  Lab 12/08/22 1231  LIPASE 57*  AMYLASE 42   No results for input(s): "AMMONIA" in the last 168 hours. Diabetic: No results for input(s): "HGBA1C" in the last 72 hours. Recent Labs  Lab 12/10/22 1228 12/10/22 1728 12/11/22 0051  12/11/22 0537 12/11/22 1219  GLUCAP 118* 104* 115* 112* 140*   Cardiac Enzymes: No results for input(s): "CKTOTAL", "CKMB", "CKMBINDEX", "TROPONINI" in the last 168 hours. No results for input(s): "PROBNP" in the last 8760 hours. Coagulation Profile: No results for input(s): "INR", "PROTIME" in the last 168 hours. Thyroid Function Tests: No results for input(s): "TSH", "T4TOTAL", "FREET4", "T3FREE", "THYROIDAB" in the last 72 hours. Lipid Profile: No results for input(s): "CHOL", "HDL", "LDLCALC", "TRIG", "CHOLHDL", "LDLDIRECT" in the last 72 hours. Anemia Panel: No results for input(s): "VITAMINB12", "FOLATE", "FERRITIN", "TIBC", "IRON", "RETICCTPCT" in the last 72 hours. Urine analysis:    Component Value Date/Time   COLORURINE YELLOW 07/24/2021 0827   APPEARANCEUR HAZY (A) 07/24/2021 0827   LABSPEC 1.019 07/24/2021 0827   PHURINE 5.0 07/24/2021 0827   GLUCOSEU NEGATIVE 07/24/2021 0827   HGBUR NEGATIVE 07/24/2021 0827   BILIRUBINUR NEGATIVE 07/24/2021 0827   KETONESUR NEGATIVE 07/24/2021 0827   PROTEINUR TRACE (A) 07/24/2021 0827   UROBILINOGEN 0.2 01/23/2010 1750   NITRITE NEGATIVE 07/24/2021 0827   LEUKOCYTESUR NEGATIVE 07/24/2021 0827   Sepsis Labs: Invalid input(s): "  PROCALCITONIN", "LACTICIDVEN"  Microbiology: No results found for this or any previous visit (from the past 240 hour(s)).  Radiology Studies: No results found.    Carlethia Mesquita T. Corrin Sieling [facility]  If 7PM-7AM, please contact night-coverage [email or web address] 12/11/2022, 12:59 PM

## 2022-12-11 NOTE — Plan of Care (Signed)
  Problem: Clinical Measurements: Goal: Ability to maintain clinical measurements within normal limits will improve Outcome: Progressing Goal: Will remain free from infection Outcome: Progressing Goal: Diagnostic test results will improve Outcome: Progressing Goal: Respiratory complications will improve Outcome: Progressing Goal: Cardiovascular complication will be avoided Outcome: Progressing   Problem: Coping: Goal: Level of anxiety will decrease Outcome: Progressing   Problem: Pain Management: Goal: General experience of comfort will improve Outcome: Progressing   Problem: Safety: Goal: Ability to remain free from injury will improve Outcome: Progressing   Problem: Skin Integrity: Goal: Risk for impaired skin integrity will decrease Outcome: Progressing

## 2022-12-11 NOTE — Anesthesia Postprocedure Evaluation (Signed)
Anesthesia Post Note  Patient: Kathryn Underwood  Procedure(s) Performed: ESOPHAGOGASTRODUODENOSCOPY (EGD) WITH PROPOFOL BIOPSY     Patient location during evaluation: PACU Anesthesia Type: MAC Level of consciousness: awake and alert Pain management: pain level controlled Vital Signs Assessment: post-procedure vital signs reviewed and stable Respiratory status: spontaneous breathing, nonlabored ventilation, respiratory function stable and patient connected to nasal cannula oxygen Cardiovascular status: stable and blood pressure returned to baseline Postop Assessment: no apparent nausea or vomiting Anesthetic complications: no   No notable events documented.  Last Vitals:  Vitals:   12/10/22 2134 12/11/22 0539  BP: 132/88 103/77  Pulse: 98 (!) 104  Resp: 20 18  Temp: 36.8 C 37.3 C  SpO2: 98% 95%    Last Pain:  Vitals:   12/11/22 0539  TempSrc: Oral  PainSc:                  [place]

## 2022-12-12 ENCOUNTER — Encounter (HOSPITAL_COMMUNITY)
Admission: EM | Disposition: A | Discharge status: Home / Self Care | Payer: Self-pay | Source: Home / Self Care | Attending: Student

## 2022-12-12 ENCOUNTER — Inpatient Hospital Stay (HOSPITAL_COMMUNITY): Payer: Medicaid Other | Admitting: Anesthesiology

## 2022-12-12 ENCOUNTER — Inpatient Hospital Stay (HOSPITAL_COMMUNITY): Payer: Medicaid Other

## 2022-12-12 ENCOUNTER — Encounter (HOSPITAL_COMMUNITY): Payer: Self-pay | Admitting: Student

## 2022-12-12 DIAGNOSIS — K2289 Other specified disease of esophagus: Secondary | ICD-10-CM

## 2022-12-12 DIAGNOSIS — K222 Esophageal obstruction: Secondary | ICD-10-CM

## 2022-12-12 DIAGNOSIS — K862 Cyst of pancreas: Secondary | ICD-10-CM

## 2022-12-12 DIAGNOSIS — K2081 Other esophagitis with bleeding: Secondary | ICD-10-CM | POA: Diagnosis not present

## 2022-12-12 DIAGNOSIS — R7989 Other specified abnormal findings of blood chemistry: Secondary | ICD-10-CM

## 2022-12-12 HISTORY — PX: FINE NEEDLE ASPIRATION: SHX5430

## 2022-12-12 HISTORY — PX: EUS: SHX5427

## 2022-12-12 HISTORY — PX: ESOPHAGOGASTRODUODENOSCOPY: SHX5428

## 2022-12-12 LAB — COMPREHENSIVE METABOLIC PANEL
ALT: 18 U/L (ref 0–44)
AST: 43 U/L — ABNORMAL HIGH (ref 15–41)
Albumin: 3.1 g/dL — ABNORMAL LOW (ref 3.5–5.0)
Alkaline Phosphatase: 208 U/L — ABNORMAL HIGH (ref 38–126)
Anion gap: 8 (ref 5–15)
BUN: 15 mg/dL (ref 8–23)
CO2: 28 mmol/L (ref 22–32)
Calcium: 8.2 mg/dL — ABNORMAL LOW (ref 8.9–10.3)
Chloride: 96 mmol/L — ABNORMAL LOW (ref 98–111)
Creatinine, Ser: 0.61 mg/dL (ref 0.44–1.00)
GFR, Estimated: >60 mL/min (ref >60)
Glucose, Bld: 139 mg/dL — ABNORMAL HIGH (ref 70–99)
Potassium: 4.6 mmol/L (ref 3.5–5.1)
Sodium: 132 mmol/L — ABNORMAL LOW (ref 135–145)
Total Bilirubin: 1.8 mg/dL — ABNORMAL HIGH (ref <1.2)
Total Protein: 6.4 g/dL — ABNORMAL LOW (ref 6.5–8.1)

## 2022-12-12 LAB — CBC
HCT: 47.5 % — ABNORMAL HIGH (ref 36.0–46.0)
Hemoglobin: 15.5 g/dL — ABNORMAL HIGH (ref 12.0–15.0)
MCH: 33.2 pg (ref 26.0–34.0)
MCHC: 32.6 g/dL (ref 30.0–36.0)
MCV: 101.7 fL — ABNORMAL HIGH (ref 80.0–100.0)
Platelets: 604 10*3/uL — ABNORMAL HIGH (ref 150–400)
RBC: 4.67 MIL/uL (ref 3.87–5.11)
RDW: 13.2 % (ref 11.5–15.5)
WBC: 24.9 10*3/uL — ABNORMAL HIGH (ref 4.0–10.5)
nRBC: 0 % (ref 0.0–0.2)

## 2022-12-12 LAB — GLUCOSE, CAPILLARY
Glucose-Capillary: 124 mg/dL — ABNORMAL HIGH (ref 70–99)
Glucose-Capillary: 105 mg/dL — ABNORMAL HIGH (ref 70–99)
Glucose-Capillary: 124 mg/dL — ABNORMAL HIGH (ref 70–99)

## 2022-12-12 LAB — BASIC METABOLIC PANEL
Anion gap: 8 (ref 5–15)
BUN: 11 mg/dL (ref 8–23)
CO2: 25 mmol/L (ref 22–32)
Calcium: 8.2 mg/dL — ABNORMAL LOW (ref 8.9–10.3)
Chloride: 100 mmol/L (ref 98–111)
Creatinine, Ser: 0.6 mg/dL (ref 0.44–1.00)
GFR, Estimated: >60 mL/min (ref >60)
Glucose, Bld: 100 mg/dL — ABNORMAL HIGH (ref 70–99)
Potassium: 4.4 mmol/L (ref 3.5–5.1)
Sodium: 133 mmol/L — ABNORMAL LOW (ref 135–145)

## 2022-12-12 LAB — LACTIC ACID, PLASMA
Lactic Acid, Venous: 1.8 mmol/L (ref 0.5–1.9)
Lactic Acid, Venous: 1.8 mmol/L (ref 0.5–1.9)

## 2022-12-12 LAB — LIPASE, BLOOD: Lipase: 31 U/L (ref 11–51)

## 2022-12-12 SURGERY — UPPER ENDOSCOPIC ULTRASOUND (EUS) LINEAR
Anesthesia: Monitor Anesthesia Care

## 2022-12-12 MED ORDER — FENTANYL CITRATE (PF) 100 MCG/2ML IJ SOLN: 25.0000 ug | Freq: Once | INTRAMUSCULAR | Status: DC

## 2022-12-12 MED ORDER — IOHEXOL 300 MG/ML  SOLN
100.0000 mL | Freq: Once | INTRAMUSCULAR | Status: AC | PRN
  Administered 2022-12-12: 100 mL via INTRAVENOUS

## 2022-12-12 MED ORDER — AMISULPRIDE (ANTIEMETIC) 5 MG/2ML IV SOLN: 10.0000 mg | Freq: Once | INTRAVENOUS | Status: DC | PRN

## 2022-12-12 MED ORDER — FENTANYL CITRATE (PF) 100 MCG/2ML IJ SOLN
INTRAMUSCULAR | Status: AC
  Filled 2022-12-12: qty 2

## 2022-12-12 MED ORDER — FENTANYL CITRATE PF 50 MCG/ML IJ SOSY
25.0000 ug | PREFILLED_SYRINGE | Freq: Once | INTRAMUSCULAR | Status: AC
  Administered 2022-12-12: 25 ug via INTRAVENOUS
  Filled 2022-12-12: qty 1

## 2022-12-12 MED ORDER — HYDROMORPHONE HCL 1 MG/ML IJ SOLN: 1.0000 mg | INTRAMUSCULAR | Status: DC | PRN

## 2022-12-12 MED ORDER — PHENYLEPHRINE 80 MCG/ML (10ML) SYRINGE FOR IV PUSH (FOR BLOOD PRESSURE SUPPORT)
PREFILLED_SYRINGE | INTRAVENOUS | Status: DC | PRN
  Administered 2022-12-12 (×3): 80 ug via INTRAVENOUS
  Administered 2022-12-12: 160 ug via INTRAVENOUS
  Administered 2022-12-12: 80 ug via INTRAVENOUS
  Administered 2022-12-12 (×4): 160 ug via INTRAVENOUS

## 2022-12-12 MED ORDER — KCL IN DEXTROSE-NACL 20-5-0.9 MEQ/L-%-% IV SOLN
INTRAVENOUS | Status: DC
  Filled 2022-12-12 (×3): qty 1000

## 2022-12-12 MED ORDER — SODIUM CHLORIDE 0.9 % IV SOLN: INTRAVENOUS | Status: DC

## 2022-12-12 MED ORDER — DEXMEDETOMIDINE HCL IN NACL 80 MCG/20ML IV SOLN
INTRAVENOUS | Status: DC | PRN
  Administered 2022-12-12: 12 ug via INTRAVENOUS

## 2022-12-12 MED ORDER — CIPROFLOXACIN HCL 500 MG PO TABS: 500.0000 mg | ORAL_TABLET | Freq: Two times a day (BID) | ORAL | Status: DC

## 2022-12-12 MED ORDER — FENTANYL CITRATE (PF) 100 MCG/2ML IJ SOLN
25.0000 ug | INTRAMUSCULAR | Status: DC | PRN
Start: 2022-12-12 — End: 2022-12-12
  Administered 2022-12-12: 25 ug via INTRAVENOUS

## 2022-12-12 MED ORDER — PROPOFOL 500 MG/50ML IV EMUL
INTRAVENOUS | Status: DC | PRN
  Administered 2022-12-12: 100 ug/kg/min via INTRAVENOUS

## 2022-12-12 MED ORDER — HYDROMORPHONE HCL 1 MG/ML IJ SOLN
0.5000 mg | INTRAMUSCULAR | Status: DC | PRN
  Administered 2022-12-12: 0.5 mg via INTRAVENOUS
  Filled 2022-12-12: qty 0.5

## 2022-12-12 MED ORDER — CIPROFLOXACIN IN D5W 400 MG/200ML IV SOLN
INTRAVENOUS | Status: DC | PRN
  Administered 2022-12-12: 400 mg via INTRAVENOUS

## 2022-12-12 MED ORDER — SODIUM CHLORIDE 0.9 % IV SOLN
2.0000 g | INTRAVENOUS | Status: AC
Start: 2022-12-12 — End: 2022-12-17
  Administered 2022-12-12 – 2022-12-16 (×5): 2 g via INTRAVENOUS
  Filled 2022-12-12 (×5): qty 20

## 2022-12-12 MED ORDER — PANTOPRAZOLE SODIUM 40 MG IV SOLR
40.0000 mg | Freq: Two times a day (BID) | INTRAVENOUS | Status: DC
  Administered 2022-12-12 – 2022-12-17 (×10): 40 mg via INTRAVENOUS
  Filled 2022-12-12 (×10): qty 10

## 2022-12-12 MED ORDER — METRONIDAZOLE 500 MG/100ML IV SOLN
500.0000 mg | Freq: Two times a day (BID) | INTRAVENOUS | Status: AC
Start: 2022-12-12 — End: 2022-12-17
  Administered 2022-12-12 – 2022-12-17 (×10): 500 mg via INTRAVENOUS
  Filled 2022-12-12 (×10): qty 100

## 2022-12-12 MED ORDER — PROPOFOL 10 MG/ML IV BOLUS
INTRAVENOUS | Status: DC | PRN
  Administered 2022-12-12 (×2): 30 mg via INTRAVENOUS
  Administered 2022-12-12: 80 mg via INTRAVENOUS
  Administered 2022-12-12: 20 mg via INTRAVENOUS

## 2022-12-12 MED ORDER — LIDOCAINE HCL (CARDIAC) PF 100 MG/5ML IV SOSY
PREFILLED_SYRINGE | INTRAVENOUS | Status: DC | PRN
  Administered 2022-12-12: 60 mg via INTRAVENOUS

## 2022-12-12 MED ORDER — HYDROMORPHONE HCL 1 MG/ML IJ SOLN
0.5000 mg | INTRAMUSCULAR | Status: DC | PRN
  Administered 2022-12-13: 0.5 mg via INTRAVENOUS
  Filled 2022-12-12: qty 0.5

## 2022-12-12 MED ORDER — BISACODYL 10 MG RE SUPP: 10.0000 mg | Freq: Every day | RECTAL | Status: DC | PRN

## 2022-12-12 MED ORDER — OXYCODONE HCL 5 MG PO TABS
5.0000 mg | ORAL_TABLET | ORAL | Status: DC | PRN
  Administered 2022-12-12 – 2022-12-18 (×18): 5 mg via ORAL
  Filled 2022-12-12 (×20): qty 1

## 2022-12-12 MED ORDER — ONDANSETRON HCL 4 MG/2ML IJ SOLN
4.0000 mg | Freq: Once | INTRAMUSCULAR | Status: DC | PRN
Start: 2022-12-12 — End: 2022-12-12

## 2022-12-12 MED ORDER — DEXMEDETOMIDINE HCL IN NACL 80 MCG/20ML IV SOLN
INTRAVENOUS | Status: AC
  Filled 2022-12-12: qty 20

## 2022-12-12 MED ORDER — KETOROLAC TROMETHAMINE 30 MG/ML IJ SOLN
30.0000 mg | Freq: Once | INTRAMUSCULAR | Status: AC
  Administered 2022-12-12: 30 mg via INTRAVENOUS
  Filled 2022-12-12: qty 1

## 2022-12-12 MED ORDER — DROPERIDOL 2.5 MG/ML IJ SOLN: 0.6250 mg | Freq: Once | INTRAMUSCULAR | Status: DC | PRN

## 2022-12-12 MED ORDER — CIPROFLOXACIN IN D5W 400 MG/200ML IV SOLN
INTRAVENOUS | Status: AC
  Filled 2022-12-12: qty 200

## 2022-12-12 MED ORDER — OXYCODONE HCL 5 MG PO TABS
5.0000 mg | ORAL_TABLET | Freq: Once | ORAL | Status: AC | PRN
  Administered 2022-12-12: 5 mg via ORAL
  Filled 2022-12-12: qty 1

## 2022-12-12 MED ORDER — OXYCODONE HCL 5 MG/5ML PO SOLN: 5.0000 mg | Freq: Once | ORAL | Status: AC | PRN

## 2022-12-12 MED ORDER — SODIUM CHLORIDE 0.9 % IV BOLUS
500.0000 mL | Freq: Once | INTRAVENOUS | Status: AC
  Administered 2022-12-12: 500 mL via INTRAVENOUS

## 2022-12-12 NOTE — Anesthesia Postprocedure Evaluation (Signed)
Anesthesia Post Note  Patient: Kathryn Underwood  Procedure(s) Performed: UPPER ENDOSCOPIC ULTRASOUND (EUS) LINEAR ESOPHAGOGASTRODUODENOSCOPY (EGD) FINE NEEDLE ASPIRATION (FNA) LINEAR     Patient location during evaluation: PACU Anesthesia Type: MAC Level of consciousness: awake and alert Pain management: pain level controlled Vital Signs Assessment: post-procedure vital signs reviewed and stable Respiratory status: spontaneous breathing, nonlabored ventilation, respiratory function stable and patient connected to nasal cannula oxygen Cardiovascular status: stable and blood pressure returned to baseline Postop Assessment: no apparent nausea or vomiting Anesthetic complications: no  No notable events documented.  Last Vitals:  Vitals:   12/12/22 0950 12/12/22 1000  BP: 115/76   Pulse: 95 97  Resp: 19 (!) 21  Temp:    SpO2: 96% 96%    Last Pain:  Vitals:   12/12/22 1023  TempSrc:   PainSc: 4                  Shelton Silvas

## 2022-12-12 NOTE — Op Note (Signed)
[facility] Patient Name: Kathryn Underwood Procedure Date: 12/12/2022 MRN: [id number] Attending MD: Corliss Parish , MD, [id number] Date of Birth: [birth year] CSN: [id number] Age: 62 Admit Type: Inpatient Procedure:                Upper EUS Indications:              Suspected mass in pancreas on MRCP Providers:                Corliss Parish, MD, Suzy Bouchard, RN, Doristine Mango, RN, Marja Kays, Technician Referring MD:             Wilhemina Bonito. Marina Goodell, MD, inpatient medical service Medicines:                Monitored Anesthesia Care, Cipro 400 mg IV Complications:            No immediate complications. Estimated Blood Loss:     Estimated blood loss was minimal. Procedure:                Pre-Anesthesia Assessment:                           - Prior to the procedure, a History and Physical                            was performed, and patient medications and                            allergies were reviewed. The patient's tolerance of                            previous anesthesia was also reviewed. The risks                            and benefits of the procedure and the sedation                            options and risks were discussed with the patient.                            All questions were answered, and informed consent                            was obtained. Prior Anticoagulants: The patient has                            taken Lovenox (enoxaparin), last dose was 2 days                            prior to procedure. ASA Grade Assessment: III - A                            patient with severe systemic disease. After  reviewing the risks and benefits, the patient was                            deemed in satisfactory condition to undergo the                            procedure.                           After obtaining informed consent, the endoscope was                            passed under direct  vision. Throughout the                            procedure, the patient's blood pressure, pulse, and                            oxygen saturations were monitored continuously. The                            [id number]-[id number] ([id number] Olympus therapeutic endoscope                            was introduced through the mouth, and advanced to                            the second part of duodenum. The [id number]                            ([id number]) Olympus linear ultrasound scope was                            introduced through the mouth, and advanced to the                            duodenum for ultrasound examination from the                            stomach and duodenum. The upper EUS was                            accomplished without difficulty. The patient                            tolerated the procedure. Scope In: Scope Out: Findings:      ENDOSCOPIC FINDING: :      No gross lesions were noted in the proximal esophagus.      LA Grade D (one or more mucosal breaks involving at least 75% of       esophageal circumference) esophagitis with bleeding was found in the       middle and distal esophagus.      The Z-line was irregular and was found 35 cm from the incisors.      A medium-sized hiatal hernia was present.  A large amount of food (residue) was found in the entire examined       stomach. Suction via Endoscope was performed led to 1100 mL.      No gross lesions were noted in the entire examined stomach.      Diffuse severely congested mucosa without active bleeding and with no       stigmata of bleeding was found in the duodenal bulb, in the duodenal       sweep and in the second portion of the duodenum.      As a result of the inflammation noted in the duodenum, this led to a       significant deformity that only the therapeutic endoscope was able to       traverse this area (linear echoendoscope and ERCP scope would not pass)      ENDOSONOGRAPHIC FINDING: :      Anechoic  lesions suggestive of a few cysts were identified in the       pancreatic head, genu of the pancreas and pancreatic body. The first       cyst measured 57 mm by 50 mm in the body region. The second cyst       measured 66 mm by 56 mm and was found in the neck. The third cyst       measured 70 mm by 58 mm and was found in the head. In an effort of       trying to better visualize the pancreatic parenchyma that was below the       cysts, I wanted and needed to perform fine-needle aspiration. Diagnostic       needle aspiration for fluid was performed of the cyst in the head       region. Color Doppler imaging was utilized prior to needle puncture to       confirm a lack of significant vascular structures within the needle       path. One pass was made with the expect 22 gauge needle using a       transduodenal approach. A stylet was used. The amount of fluid collected       was 10 mL. The fluid was turbid, brown and thick. Sample(s) were sent       for glucose, amylase concentration, bilirubin, cytology and CEA.       Diagnostic needle aspiration for fluid was performed of the cyst in the       neck region. Color Doppler imaging was utilized prior to needle puncture       to confirm a lack of significant vascular structures within the needle       path. One pass was made with the 22 gauge needle using a transgastric       approach. A stylet was used. The amount of fluid collected was 90 mL.       The fluid was clear, yellow and watery. Sample(s) were sent for       chemistry, amylase concentration, cytology and CEA.      After decompression of the cyst in the neck region, an irregular lesion       was identified in the genu of the pancreas. The lesion was hypoechoic.       The lesion measured 28 mm by 20 mm in maximal cross-sectional diameter.       The endosonographic borders were poorly-defined. There was sonographic       evidence suggesting invasion into the superior mesenteric  artery        (manifested by interface loss less than 15 mm) and the splenic artery       (manifested by interface loss less than 15 mm). An intact interface was       seen between the mass and the celiac trunk suggesting a lack of       invasion. The remainder of the pancreas was examined. The       endosonographic appearance of parenchyma and the upstream pancreatic       duct indicated duct dilation and parenchymal atrophy (I could not even       visualize true pancreatic tail and as a result of the EUS scope and the       issues of the congested mucosa in the duodenum, I could not visualize       the head completely). Fine needle biopsy was performed. Color Doppler       imaging was utilized prior to needle puncture to confirm a lack of       significant vascular structures within the needle path. Six passes were       made with the 22 gauge Acquire as biopsy needle using a transgastric       approach. A visible core of tissue was obtained. Final cytology results       are pending.      Overt visualization of the bile duct system was not able to be performed       as I could not get the linear echoendoscope to traverse beyond the       duodenal bulb to evaluate the head of the pancreas completely.      I could not visualize the ampulla since I could not get the linear       echoendoscope to traverse beyond the duodenal bulb.There was evidence of       bile duct thickening.      Multiple large collections of fluid, visualized as a freely mobile       lobulated anechoic structure, was found in the retroperitoneum       -consistent with what appeared to be acute peripancreatic fluid       collections.      A small amount of fluid, visualized as an anechoic feature, was found in       the peritoneal cavity near the left lobe of the liver.      There was diffuse abnormal echotexture in the visualized portion of the       liver. This was characterized by a heterogenous appearance.      A few enlarged  lymph nodes were visualized in the perigastric region and       peripancreatic region. The largest measured 8 mm by 7 mm in maximal       cross-sectional diameter. The nodes were triangular, isoechoic and had       well defined margins.      The celiac region was visualized. Impression:               EGD impression:                           - No gross lesions in the proximal esophagus.                           - LA Grade D erosive esophagitis with bleeding  found in the middle and distal esophagus.                           - Z-line irregular, 35 cm from the incisors.                           - 5 cm hiatal hernia.                           - A large amount of food (residue) in the stomach                            -suction 1100 mL of fluid and foodstuffs.                           - No other gross lesions in the entire stomach.                           - Congested duodenal mucosa leading to a duodenal                            deformity that did not allow traversing of the                            linear echoendoscope or our ERCP scope into the                            second portion of the duodenum.                           EUS impression:                           - A few cystic lesions were seen in the pancreatic                            head, genu of the pancreas and pancreatic body.                            Please see above for full details. One of the cysts                            in the head did not have the appearance of the                            gallbladder, but what appeared to be bile was                            removed and this is sent for laboratories as noted                            above. A second cyst in the genu of the pancreas  was obscuring the pancreatic parenchyma so was                            sampled and has more of appearance of a pseudocyst.                            Cytology and  pancreatic fluid analysis pending to                            our lab and out-of-state lab ([facility]).                           - A lesion was identified in the genu of the                            pancreas after the fluid within the neck had been                            sampled/removed. Cytology results are pending.                            However, the endosonographic appearance is                            suspicious for adenocarcinoma. This was staged T4                            N0 Mx by endosonographic criteria. The staging                            applies if malignancy is confirmed. Fine needle                            biopsy performed.                           - Large peripancreatic acute fluid collections                            noted surrounding the pancreas in the                            retroperitoneum.                           - Small locule ascites found on endosonographic                            examination of the peritoneal cavity.                           - There was diffuse abnormal echotexture in the                            visualized portion of  the liver. This was                            characterized by a heterogenous appearance.                           - A few enlarged lymph nodes were visualized in the                            perigastric region and peripancreatic region.                            Tissue has not been obtained. However, the                            endosonographic appearance is suggestive of benign                            inflammatory changes. Moderate Sedation:      Not Applicable - Patient had care per Anesthesia. Recommendation:           - The patient will be observed post-procedure,                            until all discharge criteria are met.                           - Return patient to hospital ward for ongoing care.                           - Low fat diet for 1 week.                            - Observe patient's clinical course.                           - Await cytology results and await path results.                           - Continue present medications.                           - I recommend an updated CT scan within the next                            week or 2 to see what has transpired or changed in                            regards to the peripancreatic fluid collections.                           - If patient has pain postprocedure then                            pancreatitis risk is  elevated. Although not clearly                            the gallbladder for the cyst in the head, it is                            possible that the bilious fluid was from there. We                            would recommend XRays and then possible earlier CT                            scan if needed.                           - Monitor for signs/symptoms of bleeding,                            perforation, and infection. If issues please call                            our number to get further assistance as needed.                           - Ciprofloxacin 500 mg twice daily x 5 days to                            decrease risk of post interventional infection.                           - Observe patient's clinical course.                           - Discharge home may be reasonable if patient is                            doing well otherwise monitor for at least into                            tomorrow.                           - The findings and recommendations were discussed                            with the patient.                           - The findings and recommendations were discussed                            with the referring physician. Procedure Code(s):        --- Professional ---  95621, Esophagogastroduodenoscopy, flexible,                            transoral; with transendoscopic ultrasound-guided                             intramural or transmural fine needle                            aspiration/biopsy(s), (includes endoscopic                            ultrasound examination limited to the esophagus,                            stomach or duodenum, and adjacent structures) Diagnosis Code(s):        --- Professional ---                           K20.81, Other esophagitis with bleeding                           K22.89, Other specified disease of esophagus                           K31.89, Other diseases of stomach and duodenum                           K86.2, Cyst of pancreas                           K86.89, Other specified diseases of pancreas                           R18.8, Other ascites                           R59.0, Localized enlarged lymph nodes                           R93.3, Abnormal findings on diagnostic imaging of                            other parts of digestive tract                           R93.2, Abnormal findings on diagnostic imaging of                            liver and biliary tract CPT copyright 2022 [employer]. All rights reserved. The codes documented in this report are preliminary and upon coder review may  be revised to meet current compliance requirements. Corliss Parish, MD 12/12/2022 9:47:02 AM Number of Addenda: 0

## 2022-12-12 NOTE — Plan of Care (Signed)
  Problem: Clinical Measurements: Goal: Ability to maintain clinical measurements within normal limits will improve Outcome: Progressing Goal: Will remain free from infection Outcome: Progressing Goal: Diagnostic test results will improve Outcome: Progressing Goal: Respiratory complications will improve Outcome: Progressing Goal: Cardiovascular complication will be avoided Outcome: Progressing   Problem: Activity: Goal: Risk for activity intolerance will decrease Outcome: Progressing   Problem: Nutrition: Goal: Adequate nutrition will be maintained Outcome: Progressing   Problem: Coping: Goal: Level of anxiety will decrease Outcome: Progressing   Problem: Elimination: Goal: Will not experience complications related to bowel motility Outcome: Progressing Goal: Will not experience complications related to urinary retention Outcome: Progressing   Problem: Pain Management: Goal: General experience of comfort will improve Outcome: Progressing   Problem: Safety: Goal: Ability to remain free from injury will improve Outcome: Progressing

## 2022-12-12 NOTE — Transfer of Care (Signed)
Immediate Anesthesia Transfer of Care Note  Patient: MARIJANE KOBOLD  Procedure(s) Performed: UPPER ENDOSCOPIC ULTRASOUND (EUS) LINEAR ESOPHAGOGASTRODUODENOSCOPY (EGD) FINE NEEDLE ASPIRATION (FNA) LINEAR  Patient Location: Endoscopy Unit  Anesthesia Type:MAC  Level of Consciousness: awake, drowsy, and patient cooperative  Airway & Oxygen Therapy: Patient Spontanous Breathing  Post-op Assessment: Report given to RN and Post -op Vital signs reviewed and stable  Post vital signs: Reviewed and stable  Last Vitals:  Vitals Value Taken Time  BP 105/61 12/12/22 0921  Temp 36.5 C 12/12/22 0922  Pulse 96 12/12/22 0924  Resp 26 12/12/22 0924  SpO2 100 % 12/12/22 0924  Vitals shown include unfiled device data.  Last Pain:  Vitals:   12/12/22 0922  TempSrc:   PainSc: Asleep      Patients Stated Pain Goal: 0 (12/11/22 2141)  Complications: No notable events documented.

## 2022-12-12 NOTE — Interval H&P Note (Signed)
History and Physical Interval Note:  12/12/2022 7:23 AM  Kathryn Underwood  has presented today for surgery, with the diagnosis of Pancreatic Mass.  The various methods of treatment have been discussed with the patient and family. After consideration of risks, benefits and other options for treatment, the patient has consented to  Procedure(s): UPPER ENDOSCOPIC ULTRASOUND (EUS) LINEAR (N/A) as a surgical intervention.  The patient's history has been reviewed, patient examined, no change in status, stable for surgery.  I have reviewed the patient's chart and labs.  Questions were answered to the patient's satisfaction.    The risks of an EUS including intestinal perforation, bleeding, infection, aspiration, and medication effects were discussed as was the possibility it may not give a definitive diagnosis if a biopsy is performed.  When a biopsy of the pancreas is done as part of the EUS, there is an additional risk of pancreatitis at the rate of about 1-2%.  It was explained that procedure related pancreatitis is typically mild, although it can be severe and even life threatening, which is why we do not perform random pancreatic biopsies and only biopsy a lesion/area we feel is concerning enough to warrant the risk.    [employer]

## 2022-12-12 NOTE — Progress Notes (Signed)
PROGRESS NOTE  Kathryn Underwood:[id number] DOB: 1960/01/31   PCP: Bary Leriche, PA-C  Patient is from: Home.  DOA: 12/08/2022 LOS: 4  Chief complaints Chief Complaint  Patient presents with   Weight Loss   Weakness     Brief Narrative / Interim history: 62 year old F with PMH of EtOH pancreatitis with pseudocyst, alcohol use disorder, neuropathy, anxiety, depression, GERD and tobacco use disorder presenting with decreased oral intake, fatigue and unintentional weight loss for about 2 months and found to have pancreatic mass on imaging.  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  EUS not successful on 11/14 due to foodstuff in stomach.  Patient underwent EGD/EUS on 11/16.  After procedure, she has significant abdominal pain for which she received IV fentanyl twice while in PACU.  Patient continued to have significant right-sided abdominal pain despite pain medications.   Subjective: Seen and examined this afternoon after she returned from EGD/EUS.  She reports significant RUQ pain.  She has some clear liquid and full liquid diet after she returned from PACU.  She rates her pain 10/10.  She denies nausea or vomiting.  Does not recall having flatus or bowel movement today.  Patient's daughter at bedside.  Objective: Vitals:   12/12/22 1000 12/12/22 1411 12/12/22 1641 12/12/22 1648  BP:  109/73 109/79 105/70  Pulse: 97 100 91   Resp: (!) 21 18    Temp:  99.1 F (37.3 C)    TempSrc:  Oral    SpO2: 96%     Weight:      Height:        Examination:  GENERAL: Uncomfortable due to abdominal pain. HEENT: MMM.  Vision and hearing grossly intact.  NECK: Supple.  No apparent JVD.  RESP:  No IWOB.  Fair aeration bilaterally. CVS:  RRR. Heart sounds normal.  ABD/GI/GU: BS+. Abd soft.  RUQ tenderness. MSK/EXT:  Moves extremities. No apparent deformity. No edema.  SKIN: no apparent skin  lesion or wound NEURO: Awake, alert and oriented appropriately.  No apparent focal neuro deficit. PSYCH: Uncomfortable due to abdominal pain.  Procedures:  11/14-EUS not successful due to food stuff in his stomach. 11/16-EGD/EUS.  Pathology pending.  Microbiology summarized: None  Assessment and plan: New pancreatic head mass/abdominal pain: Patient presented with unintentional weight loss, poor p.o. intake and fatigue.  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  CA 19-9 normal at 13.  Patient underwent EGD/EUS on 11/16.  Pathology pending.  Significant abdominal pain after procedure.  Patient has not had flatus or bowel movement today.  No nausea or vomiting. -IV Dilaudid and IV Toradol for pain control pending CT abdomen and pelvis.  Ordered stat. -CMP, CBC, lipase and lactic acid -N.p.o. pending CT. IV fluid bolus followed by maintenance.   Hx alcoholic pancreatitis with pseudocyst: CT abdomen and pelvis as above.  Patient without significant abdominal pain, nausea or vomiting.  No significant elevation of lipase or LFT.  CT and MRI as above.  Profound hypokalemia: Resolved.  Metabolic alkalosis: Likely from GI loss.  Resolved.   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 -Counseled on smoking cessation -Not interested in nicotine patch   Emphysema: No respiratory symptoms -Encouraged smoking cessation -As needed DuoNebs  Elevated alkaline phosphatase: Trended  from 60s to 231 this morning.  Other LFT and bilirubin within normal. -Continue monitoring  Unintentional weight loss/poor p.o. intake.  Likely due to #1 Body mass index is 19.49 kg/m. Nutrition Problem: Severe Malnutrition Etiology: chronic illness  (pancreatitis) Signs/Symptoms: energy intake < or equal to 75% for > or equal to 1 month, severe fat depletion, moderate muscle depletion, percent weight loss Interventions: Boost Breeze, MVI, Education   DVT prophylaxis:  enoxaparin (LOVENOX) injection 40 mg Start: 12/08/22 2200  Code Status: Full code Family Communication: None at bedside Level of care: Telemetry Status is: Inpatient Remains inpatient appropriate because: Pancreatic head mass concerning for adenocarcinoma, possible pancreatitis and electrolyte arrangement   Final disposition: Likely home Consultants:  Gastroenterology Oncology  55 minutes with more than 50% spent in reviewing records, counseling patient/family and coordinating care.   Sch Meds:  Scheduled Meds:  ciprofloxacin  500 mg Oral BID   enoxaparin (LOVENOX) injection  40 mg Subcutaneous Q24H   feeding supplement  1 Container Oral TID BM   gabapentin  900 mg Oral QHS   ketorolac  30 mg Intravenous Once   metoCLOPramide (REGLAN) injection  10 mg Intravenous BID   mirtazapine  45 mg Oral QHS   pantoprazole  40 mg Oral BID   QUEtiapine  100-200 mg Oral QHS   sucralfate  1 g Oral TID WC & HS   Continuous Infusions:  sodium chloride      PRN Meds:.acetaminophen **OR** acetaminophen, alum & mag hydroxide-simeth, HYDROmorphone (DILAUDID) injection, ipratropium-albuterol, ondansetron **OR** ondansetron (ZOFRAN) IV, oxyCODONE, senna-docusate  Antimicrobials: Anti-infectives (From admission, onward)    Start     Dose/Rate Route Frequency Ordered Stop   12/12/22 2000  ciprofloxacin (CIPRO) tablet 500 mg        500 mg Oral 2 times daily 12/12/22 1006 12/17/22 1959        I have personally reviewed the following labs and images: CBC: Recent Labs  Lab 12/08/22 1236 12/09/22 0531 12/10/22 0544 12/11/22 0552  WBC 10.1 7.5 6.8 7.6  HGB 15.2* 12.4 12.6 13.5  HCT 45.3 37.4 39.1 40.2  MCV 98.9 101.6* 102.9* 100.8*  PLT 424* 380 355 397   BMP  &GFR Recent Labs  Lab 12/09/22 0531 12/09/22 1745 12/10/22 0544 12/11/22 0552 12/12/22 1054  NA 137 138 135 136 133*  K 2.1* 3.1* 3.2* 4.2 4.4  CL 94* 96* 102 102 100  CO2 34* 33* 26 27 25   GLUCOSE 123* 114* 103* 119* 100*  BUN 10 9 5* 6* 11  CREATININE 0.55 0.47 0.40* 0.43* 0.60  CALCIUM 8.0* 8.3* 7.8* 8.6* 8.2*  MG 2.1 2.1 2.2 2.3  --    Estimated Creatinine Clearance: 56.7 mL/min (by C-G formula based on SCr of 0.6 mg/dL). Liver & Pancreas: Recent Labs  Lab 12/08/22 1231 12/09/22 0531 12/10/22 0544 12/11/22 0552  AST 26 18 19  38  ALT 9 12 12 21   ALKPHOS 88 67 77 231*  BILITOT 0.6 0.3 0.5 0.4  PROT 7.0 5.1* 5.0* 5.8*  ALBUMIN 3.5 2.5* 2.4* 2.7*   Recent Labs  Lab 12/08/22 1231 12/11/22 1940  LIPASE 57* 36  AMYLASE 42  --    No results for input(s): "AMMONIA" in the last 168 hours. Diabetic: No results for input(s): "HGBA1C" in the last 72 hours. Recent Labs  Lab 12/11/22 1219 12/11/22 1816 12/11/22 2349 12/12/22 0605 12/12/22 1158  GLUCAP 140* 133* 108* 124* 105*   Cardiac Enzymes: No results for input(s): "CKTOTAL", "CKMB", "CKMBINDEX", "TROPONINI" in  the last 168 hours. No results for input(s): "PROBNP" in the last 8760 hours. Coagulation Profile: No results for input(s): "INR", "PROTIME" in the last 168 hours. Thyroid Function Tests: No results for input(s): "TSH", "T4TOTAL", "FREET4", "T3FREE", "THYROIDAB" in the last 72 hours. Lipid Profile: No results for input(s): "CHOL", "HDL", "LDLCALC", "TRIG", "CHOLHDL", "LDLDIRECT" in the last 72 hours. Anemia Panel: No results for input(s): "VITAMINB12", "FOLATE", "FERRITIN", "TIBC", "IRON", "RETICCTPCT" in the last 72 hours. Urine analysis:    Component Value Date/Time   COLORURINE YELLOW 07/24/2021 0827   APPEARANCEUR HAZY (A) 07/24/2021 0827   LABSPEC 1.019 07/24/2021 0827   PHURINE 5.0 07/24/2021 0827   GLUCOSEU NEGATIVE 07/24/2021 0827   HGBUR NEGATIVE 07/24/2021 0827   BILIRUBINUR NEGATIVE  07/24/2021 0827   KETONESUR NEGATIVE 07/24/2021 0827   PROTEINUR TRACE (A) 07/24/2021 0827   UROBILINOGEN 0.2 01/23/2010 1750   NITRITE NEGATIVE 07/24/2021 0827   LEUKOCYTESUR NEGATIVE 07/24/2021 0827   Sepsis Labs: Invalid input(s): "PROCALCITONIN", "LACTICIDVEN"  Microbiology: No results found for this or any previous visit (from the past 240 hour(s)).  Radiology Studies: No results found.    Nayali Talerico T. Lanaysia Fritchman [facility]  If 7PM-7AM, please contact night-coverage [email or web address] 12/12/2022, 5:09 PM

## 2022-12-12 NOTE — Plan of Care (Signed)

## 2022-12-13 ENCOUNTER — Encounter (HOSPITAL_COMMUNITY): Payer: Self-pay

## 2022-12-13 ENCOUNTER — Inpatient Hospital Stay (HOSPITAL_COMMUNITY): Payer: Medicaid Other

## 2022-12-13 DIAGNOSIS — K222 Esophageal obstruction: Secondary | ICD-10-CM | POA: Diagnosis not present

## 2022-12-13 DIAGNOSIS — K860 Alcohol-induced chronic pancreatitis: Secondary | ICD-10-CM | POA: Diagnosis not present

## 2022-12-13 DIAGNOSIS — E46 Unspecified protein-calorie malnutrition: Secondary | ICD-10-CM | POA: Diagnosis not present

## 2022-12-13 DIAGNOSIS — K8689 Other specified diseases of pancreas: Secondary | ICD-10-CM | POA: Diagnosis not present

## 2022-12-13 HISTORY — PX: IR PERC CHOLECYSTOSTOMY: IMG2326

## 2022-12-13 LAB — COMPREHENSIVE METABOLIC PANEL
ALT: 26 U/L (ref 0–44)
AST: 67 U/L — ABNORMAL HIGH (ref 15–41)
Albumin: 2.4 g/dL — ABNORMAL LOW (ref 3.5–5.0)
Alkaline Phosphatase: 188 U/L — ABNORMAL HIGH (ref 38–126)
Anion gap: 5 (ref 5–15)
BUN: 17 mg/dL (ref 8–23)
CO2: 23 mmol/L (ref 22–32)
Calcium: 7.7 mg/dL — ABNORMAL LOW (ref 8.9–10.3)
Chloride: 105 mmol/L (ref 98–111)
Creatinine, Ser: 0.76 mg/dL (ref 0.44–1.00)
GFR, Estimated: >60 mL/min (ref >60)
Glucose, Bld: 144 mg/dL — ABNORMAL HIGH (ref 70–99)
Potassium: 5 mmol/L (ref 3.5–5.1)
Sodium: 133 mmol/L — ABNORMAL LOW (ref 135–145)
Total Bilirubin: 1.5 mg/dL — ABNORMAL HIGH (ref <1.2)
Total Protein: 5.2 g/dL — ABNORMAL LOW (ref 6.5–8.1)

## 2022-12-13 LAB — CBC
HCT: 43 % (ref 36.0–46.0)
Hemoglobin: 14 g/dL (ref 12.0–15.0)
MCH: 33.3 pg (ref 26.0–34.0)
MCHC: 32.6 g/dL (ref 30.0–36.0)
MCV: 102.4 fL — ABNORMAL HIGH (ref 80.0–100.0)
Platelets: 469 10*3/uL — ABNORMAL HIGH (ref 150–400)
RBC: 4.2 MIL/uL (ref 3.87–5.11)
RDW: 13.2 % (ref 11.5–15.5)
WBC: 14.9 10*3/uL — ABNORMAL HIGH (ref 4.0–10.5)
nRBC: 0 % (ref 0.0–0.2)

## 2022-12-13 LAB — C-REACTIVE PROTEIN: CRP: 10.1 mg/dL — ABNORMAL HIGH (ref <1.0)

## 2022-12-13 LAB — GLUCOSE, CAPILLARY
Glucose-Capillary: 137 mg/dL — ABNORMAL HIGH (ref 70–99)
Glucose-Capillary: 156 mg/dL — ABNORMAL HIGH (ref 70–99)
Glucose-Capillary: 183 mg/dL — ABNORMAL HIGH (ref 70–99)
Glucose-Capillary: 193 mg/dL — ABNORMAL HIGH (ref 70–99)

## 2022-12-13 LAB — LIPASE, BLOOD: Lipase: 29 U/L (ref 11–51)

## 2022-12-13 LAB — PROTIME-INR
INR: 1.3 — ABNORMAL HIGH (ref 0.8–1.2)
Prothrombin Time: 16.5 s — ABNORMAL HIGH (ref 11.4–15.2)

## 2022-12-13 LAB — MAGNESIUM: Magnesium: 3.3 mg/dL — ABNORMAL HIGH (ref 1.7–2.4)

## 2022-12-13 LAB — SEDIMENTATION RATE: Sed Rate: 22 mm/h (ref 0–22)

## 2022-12-13 LAB — BILIRUBIN, DIRECT: Bilirubin, Direct: 0.7 mg/dL — ABNORMAL HIGH (ref 0.0–0.2)

## 2022-12-13 MED ORDER — IOHEXOL 300 MG/ML  SOLN
50.0000 mL | Freq: Once | INTRAMUSCULAR | Status: AC | PRN
Start: 2022-12-13 — End: 2022-12-13
  Administered 2022-12-13: 5 mL

## 2022-12-13 MED ORDER — FENTANYL CITRATE (PF) 100 MCG/2ML IJ SOLN
INTRAMUSCULAR | Status: AC | PRN
  Administered 2022-12-13 (×2): 25 ug via INTRAVENOUS

## 2022-12-13 MED ORDER — VITAMIN K1 10 MG/ML IJ SOLN
10.0000 mg | Freq: Once | INTRAVENOUS | Status: AC
  Administered 2022-12-13: 10 mg via INTRAVENOUS
  Filled 2022-12-13: qty 1

## 2022-12-13 MED ORDER — MIDAZOLAM HCL 2 MG/2ML IJ SOLN
INTRAMUSCULAR | Status: AC | PRN
  Administered 2022-12-13: 1 mg via INTRAVENOUS
  Administered 2022-12-13: .5 mg via INTRAVENOUS

## 2022-12-13 MED ORDER — LIDOCAINE HCL 1 % IJ SOLN
INTRAMUSCULAR | Status: AC | PRN
  Administered 2022-12-13: 16 mg via INTRADERMAL

## 2022-12-13 MED ORDER — HEPARIN (PORCINE) 25000 UT/250ML-% IV SOLN: 850.0000 [IU]/h | INTRAVENOUS | Status: DC

## 2022-12-13 MED ORDER — DEXTROSE-SODIUM CHLORIDE 5-0.9 % IV SOLN
INTRAVENOUS | Status: AC
  Administered 2022-12-13: 1000 mL via INTRAVENOUS

## 2022-12-13 MED ORDER — HYDROMORPHONE HCL 1 MG/ML IJ SOLN
0.5000 mg | INTRAMUSCULAR | Status: DC | PRN
  Administered 2022-12-16 – 2022-12-18 (×5): 0.5 mg via INTRAVENOUS
  Filled 2022-12-13 (×5): qty 0.5

## 2022-12-13 MED ORDER — LIDOCAINE HCL 1 % IJ SOLN
INTRAMUSCULAR | Status: AC
  Filled 2022-12-13: qty 20

## 2022-12-13 MED ORDER — FENTANYL CITRATE (PF) 100 MCG/2ML IJ SOLN
INTRAMUSCULAR | Status: AC
  Filled 2022-12-13: qty 2

## 2022-12-13 MED ORDER — HEPARIN BOLUS VIA INFUSION
2000.0000 [IU] | Freq: Once | INTRAVENOUS | Status: DC
  Filled 2022-12-13: qty 2000

## 2022-12-13 MED ORDER — MIDAZOLAM HCL 2 MG/2ML IJ SOLN
INTRAMUSCULAR | Status: AC
Start: 2022-12-13 — End: ?
  Filled 2022-12-13: qty 2

## 2022-12-13 MED ORDER — LIDOCAINE HCL 1 % IJ SOLN
20.0000 mL | Freq: Once | INTRAMUSCULAR | Status: DC
  Filled 2022-12-13: qty 20

## 2022-12-13 NOTE — Progress Notes (Signed)
CHG bath done. Lashauna last voided 45 minutes ago, Has had a few ice chips.Patient upset she didn't have any notice of having this procedure done now. She thought she was having it tomorrow. Family was notified by patient.

## 2022-12-13 NOTE — Procedures (Signed)
Interventional Radiology Procedure:   Indications: Abdominal pain with severe gallbladder distention  Procedure: Percutaneous cholecystostomy tube placement  Findings: Severe dilatation of gallbladder with surrounding fluid and edema.  10 Fr drain placed and 200 ml of dark green fluid was aspirated.    Complications: None     EBL: Minimal  Plan: Follow drain output and labs.  Fluid sent for culture and cytology.  Lamae Fosco R. Lowella Dandy, MD  Pager: [phone]

## 2022-12-13 NOTE — Progress Notes (Signed)
   12/13/22 1347  Vitals  Temp 98.9 F (37.2 C)  Temp Source Oral  BP 98/69  MAP (mmHg) 79  BP Location Left Arm  BP Method Automatic  Patient Position (if appropriate) Lying  Pulse Rate (!) 117 (Rn notified)  Pulse Rate Source Monitor  Resp 16  MEWS COLOR  MEWS Score Color Yellow  Oxygen Therapy  SpO2 97 %  O2 Device Room Air  MEWS Score  MEWS Temp 0  MEWS Systolic 1  MEWS Pulse 2  MEWS RR 0  MEWS LOC 0  MEWS Score 3  Provider Notification  Provider Name/Title Dr. Candelaria Stagers  Date Provider Notified 12/13/22  Time Provider Notified 1412  Method of Notification Page  Notification Reason Other (Comment) (change in Vital signs)  Provider response No new orders  Date of Provider Response 12/13/22  Time of Provider Response 1430

## 2022-12-13 NOTE — Progress Notes (Addendum)
Gastroenterology Inpatient Follow-up Note   PATIENT IDENTIFICATION  Kathryn Underwood is a 62 y.o. female Hospital Day: 6  SUBJECTIVE  The patient's chart has been reviewed. The patient's labs have been reviewed.  Improving leukocytosis this morning. The patient's imaging has been reviewed. Patient and her partner are evaluated this morning. Patient's pain has improved from yesterday to today though she did get some pain medication a few hours ago. She has an appetite and would like to try to eat if possible. She denies any fevers or chills. Extensive discussion and visualization of imaging to discuss the results of the CT was performed today. They are concerned about the risk of progression as quickly as things have occurred.   OBJECTIVE  Scheduled Inpatient Medications:   enoxaparin (LOVENOX) injection  40 mg Subcutaneous Q24H   feeding supplement  1 Container Oral TID BM   gabapentin  900 mg Oral QHS   mirtazapine  45 mg Oral QHS   pantoprazole (PROTONIX) IV  40 mg Intravenous Q12H   QUEtiapine  100-200 mg Oral QHS   sucralfate  1 g Oral TID WC & HS   Continuous Inpatient Infusions:   cefTRIAXone (ROCEPHIN)  IV 2 g (12/12/22 2035)   dextrose 5 % and 0.9 % NaCl 1,000 mL (12/13/22 0829)   metronidazole 500 mg (12/13/22 0543)   phytonadione (VITAMIN K) 10 mg in dextrose 5 % 50 mL IVPB     PRN Inpatient Medications: acetaminophen **OR** acetaminophen, alum & mag hydroxide-simeth, bisacodyl, HYDROmorphone (DILAUDID) injection, ipratropium-albuterol, ondansetron **OR** ondansetron (ZOFRAN) IV, oxyCODONE   Physical Examination  Temp:  [97.4 F (36.3 C)-99.1 F (37.3 C)] 97.4 F (36.3 C) (11/17 0536) Pulse Rate:  [91-104] 104 (11/17 0536) Resp:  [14-30] 14 (11/17 0536) BP: (101-115)/(61-79) 101/71 (11/17 0536) SpO2:  [95 %-99 %] 99 % (11/17 0536) Temp (24hrs), Avg:98 F (36.7 C), Min:97.4 F (36.3 C), Max:99.1 F (37.3 C)  Weight: 49.9 kg GEN: NAD, resting in bed,  partner at bedside, appears chronically ill but is nontoxic  PSYCH: Cooperative, without pressured speech EYE: Conjunctivae pink, sclerae anicteric ENT: MMM NECK: Supple CV: Nontachycardic RESP: No audible wheezing GI: NABS, soft, TTP in RUQ radiating to the right flank and back, protuberant abdomen, volitional guarding present, no rebound  MSK/EXT: No significant lower extremity edema SKIN: No jaundice, no spider angiomata NEURO:  Alert & Oriented x 3, no focal deficits   Review of Data   Laboratory Studies   Recent Labs  Lab 12/13/22 0611  NA 133*  K 5.0  CL 105  CO2 23  BUN 17  CREATININE 0.76  GLUCOSE 144*  CALCIUM 7.7*  MG 3.3*   Recent Labs  Lab 12/13/22 0611  AST 67*  ALT 26  ALKPHOS 188*    Recent Labs  Lab 12/11/22 0552 12/12/22 1851 12/13/22 0611  WBC 7.6 24.9* 14.9*  HGB 13.5 15.5* 14.0  HCT 40.2 47.5* 43.0  PLT 397 604* 469*   Recent Labs  Lab 12/13/22 0611  INR 1.3*    Imaging Studies  CT ABDOMEN PELVIS W CONTRAST  Result Date: 12/12/2022 CLINICAL DATA:  Acute abdominal pain,  pancreatitis, pancreatic pass EXAM: CT ABDOMEN AND PELVIS WITH CONTRAST TECHNIQUE: Multidetector CT imaging of the abdomen and pelvis was performed using the standard protocol following bolus administration of intravenous contrast. RADIATION DOSE REDUCTION: This exam was performed according to the departmental dose-optimization program which includes automated exposure control, adjustment of the mA and/or kV according to patient size and/or  use of iterative reconstruction technique. CONTRAST:  OMNIPAQUE IOHEXOL 300 MG/ML  SOLN COMPARISON:  12/09/2022, 12/08/2022 FINDINGS: Lower chest: No acute pleural or parenchymal lung disease. Stable pericardial effusion. Stable distal esophageal wall thickening. Small hiatal hernia. Hepatobiliary: Marked distension of the gallbladder is again noted, with intrahepatic duct dilation and proximal common bile duct distension, due to  occlusion by the infiltrative pancreatic mass image previously. Heterogeneous appearance of the liver parenchyma within the inferior right lobe and extending along the gallbladder fossa could likely due to known portal vein thrombosis or geographic hepatic steatosis as seen on recent MRI. Multiple liver hypodensities are unchanged, previously characterized on MRI. Pancreas: The infiltrative mass centered at the pancreatic head is again noted, measuring up to 6.1 x 4.4 cm, compatible with pancreatic neoplasm based on MRI findings. Upstream pancreatic parenchymal atrophy. Decreased fluid collections surrounding the pancreas, now measuring 5.6 x 2.5 cm image 36/2 and 2.4 x 2.2 cm reference image 44/2. Spleen: Normal in size without focal abnormality. Adrenals/Urinary Tract: Stable appearance of the adrenals and kidneys. Bladder is decompressed, limiting its evaluation. Stomach/Bowel: Prominent gastric distension again noted, with functional gastric outlet obstruction due to mass effect by the infiltrative pancreatic mass described above. Likely ulceration within the gastric antrum measuring up to 1.8 cm reference image 26/5. No evidence of perforation. No evidence of bowel obstruction or ileus. Vascular/Lymphatic: Continued occlusion of the splenic vein, SMV, and proximal aspect of the portal vein again noted due to extrinsic compression by the infiltrative pancreatic mass. There is nonocclusive thrombus within the main portal vein near the bifurcation. Extensive venous collaterals are seen throughout the mesentery unchanged since prior exam. Stable aortic atherosclerosis. No discrete pathologic adenopathy identified. Numerous subcentimeter mesenteric lymph nodes adjacent to the pancreatic mass in the upper abdomen are nonspecific. Reproductive: Calcified uterine fibroids unchanged. No adnexal masses. Other: Free fluid surrounds the distended gallbladder within the right upper quadrant. No free intraperitoneal gas. No  abdominal wall hernia. Musculoskeletal: No acute or destructive bony abnormalities. Reconstructed images demonstrate no additional findings. IMPRESSION: 1. Stable infiltrating pancreatic mass, with thrombosis of the splenic vein, SMV, and proximal portal vein. Nonocclusive thrombus within the main portal vein just proximal to the bifurcation. 2. Decreased size of the peripancreatic fluid collections as above. New simple appearing free fluid surrounding a distended gallbladder and right upper quadrant could reflect interval rupture of these peripancreatic fluid collections. 3. Continued gastric distension compatible with functional gastric outlet obstruction due to the infiltrating pancreatic mass. Suspected ulcer within the gastric antrum, without evidence of perforation. 4. Marked gallbladder distension, with intrahepatic and extrahepatic biliary duct dilation, due to downstream common bile duct obstruction due to the pancreatic mass as seen on recent MRI. No evidence of gallbladder wall thickening or calcified gallstones. 5.  Aortic Atherosclerosis (ICD10-I70.0). Electronically Signed   By: Sharlet Salina M.D.   On: 12/12/2022 17:56    GI Procedures and Studies  Upper EUS EGD impression: - No gross lesions in the proximal esophagus. - LA Grade D erosive esophagitis with bleeding found in the middle and distal esophagus. - Z-line irregular, 35 cm from the incisors. - 5 cm hiatal hernia. - A large amount of food (residue) in the stomach -suction 1100 mL of fluid and foodstuffs. - No other gross lesions in the entire stomach. - Congested duodenal mucosa leading to a duodenal deformity that did not allow traversing of the linear echoendoscope or our ERCP scope into the second portion of the duodenum. EUS impression: - A  few cystic lesions were seen in the pancreatic head, genu of the pancreas and pancreatic body. Please see above for full details. One of the cysts in the head did not have the appearance of the  gallbladder, but what appeared to be bile was removed and this is sent for laboratories as noted above. A second cyst in the genu of the pancreas was obscuring the pancreatic parenchyma so was sampled and has more of appearance of a pseudocyst. Cytology and pancreatic fluid analysis pending to our lab and out-of-state lab ([facility]). - A lesion was identified in the genu of the pancreas after the fluid within the neck had been sampled/removed. Cytology results are pending. However, the endosonographic appearance is suspicious for adenocarcinoma. This was staged T4 N0 Mx by endosonographic criteria. The staging applies if malignancy is confirmed. Fine needle biopsy performed. - Large peripancreatic acute fluid collections noted surrounding the pancreas in the retroperitoneum. - Small locule ascites found on endosonographic examination of the peritoneal cavity. - There was diffuse abnormal echotexture in the visualized portion of the liver. This was characterized by a heterogenous appearance. - A few enlarged lymph nodes were visualized in the perigastric region and peripancreatic region. Tissue has not been obtained. However, the endosonographic appearance is suggestive of benign inflammatory changes.   ASSESSMENT  Ms. Ogletree is a 62 y.o. female with a pmh significant for anxiety, previous polysubstance abuse, chronic pancreatitis, GERD.  Patient admitted to hospital with progressive abdominal discomfort, anorexia, unintentional weight loss and found to have abnormal pancreas imaging and elevated CA 19-9 now status post EUS and updated imaging showing signs concerning for progressive biliary dilation concerning for oncoming obstruction.  The patient is hemodynamically stable today.  Clinically, it seems like she had significant issues over the course of yesterday in the afternoon post her endoscopic ultrasound which led our medicine colleagues to obtain updated cross-sectional imaging, which I think is  absolutely indicated to rule out complications.  What it showed is findings that are concerning for rapid progression of her pancreatic findings.  It also showed as our endoscopic ultrasound was showing, that there is concern for developing gastric outlet obstruction.  Her liver biochemical testing also had increased yesterday and it looks like imaging is suggestive of progressive biliary dilation as well.  As well the significant thrombus that the patient has is no worse than what it was when she came into the hospital, but certainly is concerning to me and to our medicine colleagues.  Everything suggest that this is an underlying malignancy, hopefully the biopsies from a endoscopic ultrasound will help [place] put a answer as to what this is.  However as I documented my EUS report, evaluation of the head of the pancreas was not able to be performed as a result of the significant angulation and congestion that had developed within the duodenum such that I could not actually place my linear echoendoscope into that area.  I think that the reason is more likely from progressive biliary obstruction leading to gallbladder distention that is likely causing this issue.  With this being said, with her liver tests increasing, I do think it is reasonable for [place] to be concerned that in the course of the coming days or weeks she will have a much more significant biliary obstruction develop where as a result of the inability to pass our ERCP scope into the area that she will likely need a percutaneous transhepatic biliary drain.  The other concern is that she will likely have  progressive outlet obstruction symptoms develop where she may need GJ placement or need an internal stent to be considered.  I think the transition of antibiotics yesterday to IV makes the most sense but we will monitor her at least through today.  It may be worth having [place] reach out to our interventional radiologist to get her ready for potential PTBD if her  liver test do not further improve into Monday.  Hopefully biopsy results will be back by Tuesday or Wednesday from our EUS sampling yesterday.  In regards to anticoagulation, I think this is hard for [place] to know what is or what is not right, because if this is malignancy related thrombosis, it is not clear that anticoagulation will be helpful so if we want to start anticoagulation I would start with heparin and then our hematology/oncology colleagues who will likely be back next week can help [place] determine use, but I think IV heparin makes the most sense especially if she may need other procedures (potentially PTBD +/- GJ versus enteral stenting) within the next week.  This was explained in great detail and reviewed with imaging with the patient and her significant other/partner.  No other plans at this point today for additional imaging.  Will repeat laboratories tomorrow.  Today is my last day on service but Dr. Russella Dar will take over for the [facility] long service tomorrow.   PLAN/RECOMMENDATIONS  Continue IV ceftriaxone/Flagyl for now Trend LFTs daily If LFT pattern does not improve in the course of the next 24 to 48 hours, I think moving forward with interventional radiology for PTBD placement consideration makes sense - If issues with entering the bile duct, seeing that the gallbladder has distended, could be another access point for biliary decompression if needed Hopefully EUS biopsies will be back by Tuesday or Wednesday Okay for clear liquid diet but not advancing much further currently Suspect patient will need either GJ as bypass (surgically felt less likely with how advanced her disease is) versus attempted enteral stenting or just GJ feeding tube placement, but with the amount of pancreatitis, this could be more complex If enteral stenting needs to be considered, may need to have the PTBD already in place so that we have less risks of issues In regards to anticoagulation, we can consider  initiation of this and if we do I would do heparin drip and monitor, I would say this because there is a potential of multiple procedures that may be required later this week - Could also wait for hematology/oncology to decide how they want to approach this with what may be tumor thrombus (if this turns out to be malignancy related Continue PPI twice daily Continue Carafate 4 times daily   Please page/call with questions or concerns.   Corliss Parish, MD [facility] Office # [phone]    LOS: 5 days  Lemar Lofty  12/13/2022, 9:07 AM    1100AM Addendum Further discussion with IR and evaluation of the gallbladder distention and fluid around it is concerning for impending rupture.  We have made patient NPO, and will continue IV Abx.  If IR has availability to perform Percutaneous drainage today, we are on board with this.  If not able, then will plan for this to occur on Monday.  Discussed with Medicine service as well.  Patient now NPO from small sips of Boost @1055 .

## 2022-12-13 NOTE — Progress Notes (Addendum)
PHARMACY - ANTICOAGULATION CONSULT NOTE  Pharmacy Consult for IV heparin Indication: abdominal DVT  No Known Allergies  Patient Measurements: Height: 5\' 3"  (160 cm) Weight: 49.9 kg (110 lb) IBW/kg (Calculated) : 52.4 Heparin Dosing Weight: TBW  Vital Signs: Temp: 97.4 F (36.3 C) (11/17 0536) Temp Source: Oral (11/17 0536) BP: 101/71 (11/17 0536) Pulse Rate: 104 (11/17 0536)  Labs: Recent Labs    12/11/22 0552 12/12/22 1054 12/12/22 1851 12/13/22 0611  HGB 13.5  --  15.5* 14.0  HCT 40.2  --  47.5* 43.0  PLT 397  --  604* 469*  LABPROT  --   --   --  16.5*  INR  --   --   --  1.3*  CREATININE 0.43* 0.60 0.61 0.76    Estimated Creatinine Clearance: 56.7 mL/min (by C-G formula based on SCr of 0.76 mg/dL).   Medical History: Past Medical History:  Diagnosis Date   Alcohol abuse    Anxiety    Chickenpox    Depression    Neuromuscular disorder (HCC)    neuropathy   Substance abuse (HCC)     Medications:  Medications Prior to Admission  Medication Sig Dispense Refill Last Dose   gabapentin (NEURONTIN) 300 MG capsule Take 2 capsules (600 mg total) by mouth 2 (two) times daily. (Patient taking differently: Take 900 mg by mouth at bedtime.) 60 capsule 0 12/07/2022   mirtazapine (REMERON) 45 MG tablet Take 45 mg by mouth at bedtime.   Past Week   QUEtiapine (SEROQUEL) 100 MG tablet Take 100-200 mg by mouth at bedtime.   12/07/2022   albuterol (VENTOLIN HFA) 108 (90 Base) MCG/ACT inhaler Inhale 2 puffs into the lungs every 6 (six) hours as needed for wheezing or shortness of breath. (Patient not taking: Reported on 12/08/2022) 8.5 g 1 Not Taking   folic acid (FOLVITE) 1 MG tablet Take 1 tablet (1 mg total) by mouth daily. (Patient not taking: Reported on 12/08/2022) 30 tablet 1 Not Taking   mirtazapine (REMERON) 15 MG tablet Take 1 tablet (15 mg total) by mouth at bedtime. (Patient not taking: Reported on 12/08/2022) 30 tablet 1 Not Taking   mometasone-formoterol  (DULERA) 200-5 MCG/ACT AERO Inhale 2 puffs into the lungs 2 (two) times daily. (Patient not taking: Reported on 12/08/2022) 13 g 1 Not Taking   QUEtiapine (SEROQUEL) 25 MG tablet Take 1 tablet (25 mg total) by mouth at bedtime. (Patient not taking: Reported on 12/08/2022) 30 tablet 1 Not Taking   thiamine 100 MG tablet Take 1 tablet (100 mg total) by mouth daily. (Patient not taking: Reported on 09/12/2021) 30 tablet 1    Scheduled:   feeding supplement  1 Container Oral TID BM   gabapentin  900 mg Oral QHS   heparin  2,000 Units Intravenous Once   mirtazapine  45 mg Oral QHS   pantoprazole (PROTONIX) IV  40 mg Intravenous Q12H   QUEtiapine  100-200 mg Oral QHS   sucralfate  1 g Oral TID WC & HS   PRN: acetaminophen **OR** acetaminophen, alum & mag hydroxide-simeth, bisacodyl, HYDROmorphone (DILAUDID) injection, ipratropium-albuterol, ondansetron **OR** ondansetron (ZOFRAN) IV, oxyCODONE  Assessment: 69 yoF admitted 11/12 for acute on chronic pancreatitis symptoms. Found to have a pancreatic mass concerning for malignancy. CT also reveals splenic, SMV, and portal vein thrombus. GI uncertain if this is true thrombus vs tumor compression, but for now would like Pharmacy to start IV heparin, until HemOnc can see on Monday.  Baseline INR, aPTT: mildly  elevated; consistent with acute hepatic congestion from above processes Prior anticoagulation: none PTA; on Lovenox 40 mg/d; last dose given 11/16 PM  Significant events:  Today, 12/13/2022: CBC: WNL (Plt elevated but trending down) SCr stable < 1.0 No bleeding or infusion issues per nursing  Goal of Therapy: Heparin level 0.3-0.7 units/ml Monitor platelets by anticoagulation protocol: Yes  Plan: Heparin 2000 units IV bolus x 1 Heparin 850 units/hr IV infusion Check heparin level 6-8 hrs after start Daily CBC, daily heparin level once stable Monitor for signs of bleeding or thrombosis  Bernadene Person, PharmD,  BCPS [phone] 12/13/2022, 9:45 AM    ADDENDUM After GI discussion with IR, planning for percutaneous biliary drain when able. Heparin orders/labs discontinued. F/u timing for heparin start post-procedure.  Bernadene Person, PharmD, BCPS [phone] 12/13/2022, 2:09 PM

## 2022-12-13 NOTE — Plan of Care (Signed)

## 2022-12-13 NOTE — TOC Initial Note (Signed)
Transition of Care [facility]) - Initial/Assessment Note   Patient Details  Name: Kathryn Underwood MRN: [id number] Date of Birth: 04/08/1960  [facility] CM/SW Contact:    Ewing Schlein, LCSW Phone Number: 12/13/2022, 11:26 AM  Clinical Narrative: Per chart review, patient does not have insurance but has a PCP. Patient currently resides with her significant other. Patient has history of alcohol-induced chronic pancreatitis. CSW added ETOH use resources to AVS.  Expected Discharge Plan: Home/Self Care Barriers to Discharge: Continued Medical Work up  Patient Goals and CMS Choice Choice offered to / list presented to : NA  Expected Discharge Plan and Services In-house Referral: Clinical Social Work Post Acute Care Choice: NA Living arrangements for the past 2 months: Single Family Home           DME Arranged: N/A DME Agency: NA  Prior Living Arrangements/Services Living arrangements for the past 2 months: Single Family Home Lives with:: Significant Other Patient language and need for interpreter reviewed:: Yes Need for Family Participation in Patient Care: No (Comment) Care giver support system in place?: Yes (comment) Criminal Activity/Legal Involvement Pertinent to Current Situation/Hospitalization: No - Comment as needed  Activities of Daily Living ADL Screening (condition at time of admission) Independently performs ADLs?: Yes (appropriate for developmental age) Is the patient deaf or have difficulty hearing?: No Does the patient have difficulty seeing, even when wearing glasses/contacts?: No Does the patient have difficulty concentrating, remembering, or making decisions?: No  Emotional Assessment Orientation: : Oriented to Self, Oriented to Place, Oriented to  Time, Oriented to Situation Alcohol / Substance Use: Alcohol Use Psych Involvement: No (comment)  Admission diagnosis:  Pancreatic mass [K86.89] Patient Active Problem List   Diagnosis Date Noted   High  serum carbohydrate antigen 19-9 (CA19-9) 12/12/2022   Pancreatic cyst 12/12/2022   Abnormal CT scan, gastrointestinal tract 12/11/2022   Acute esophagitis 12/10/2022   Alcohol-induced chronic pancreatitis (HCC) 12/09/2022   Protein malnutrition (HCC) 12/09/2022   Unintentional weight loss 12/09/2022   Esophageal dysphagia 12/09/2022   Dilation of pancreatic duct 12/09/2022   Protein-calorie malnutrition, severe 12/09/2022   Pancreatic mass 12/08/2022   Multifocal atrial tachycardia (HCC) 08/31/2021   Delirium tremens (HCC) 07/27/2021   Pancreas hemorrhage 07/27/2021   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   Uterine fibroid 07/27/2021   COPD (chronic obstructive pulmonary disease) (HCC) 07/27/2021   Insomnia 07/27/2021   Pulmonary nodule 07/25/2021   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/25/2021   Alcohol induced acute pancreatitis without necrosis or infection 10/05/2017   Alcoholic ketoacidosis 10/04/2017   Epigastric pain 10/04/2017   MDD (major depressive disorder), recurrent severe, 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   Alcohol withdrawal (HCC) 04/25/2016   Sinus tachycardia 04/25/2016   Transaminitis 04/25/2016   Leukocytosis 04/25/2016   Closed nondisplaced fracture of fifth right metatarsal bone 04/08/2016   Acute pain of right shoulder 04/08/2016   Vitamin D deficiency 12/11/2015   Alcohol dependence with unspecified alcohol-induced disorder (HCC) 12/09/2015   Fatigue 12/09/2015   Encounter for alcohol abuse counseling and surveillance 12/09/2015   Cough 01/04/2015   Tobacco abuse 01/04/2015   Depression with anxiety 01/04/2015   PCP:  Bary Leriche, PA-C Pharmacy:   [facility] [phone] [facility] [place], [place] - [address] [place] [address] Phone: [phone] Fax: [phone]  [facility] [address] [place] [address] Phone: [phone] Fax:  [phone]  [facility] [address] [place] [address] Phone: [phone] Fax: [phone]  [facility] [address] [place] [address] Phone: [phone] Fax: [phone]  Social Determinants of Health (SDOH) Social History: SDOH Screenings   Food Insecurity: No Food Insecurity (12/08/2022)  Housing: Low Risk  (12/08/2022)  Transportation Needs: No Transportation Needs (12/08/2022)  Utilities: Not At Risk (12/08/2022)  Tobacco Use: High Risk (12/12/2022)   SDOH Interventions:    Readmission Risk Interventions     No data to display

## 2022-12-13 NOTE — Progress Notes (Signed)
PROGRESS NOTE  Kathryn Underwood MVH:[id number] DOB: November 11, 1960   PCP: Bary Leriche, PA-C  Patient is from: Home.  DOA: 12/08/2022 LOS: 5  Chief complaints Chief Complaint  Patient presents with   Weight Loss   Weakness     Brief Narrative / Interim history: 62 year old F with PMH of EtOH pancreatitis with pseudocyst, alcohol use disorder, neuropathy, anxiety, depression, GERD and tobacco use disorder presenting with decreased oral intake, fatigue and unintentional weight loss for about 2 months and found to have pancreatic mass on imaging.  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  EUS not successful on 11/14 due to foodstuff in stomach.  Patient underwent EGD/EUS on 11/16.  After procedure, she has significant abdominal pain for which she received IV fentanyl twice while in PACU.  Patient continued to have significant right-sided abdominal pain despite pain medications.  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  IR consulted for percutaneous cholecystostomy.  Subjective: Seen and examined earlier this morning.  No major events overnight of this morning.  Feels better today.  Reports improvement in her pain.  She rates her pain about 4/10.  Denies nausea or vomiting.  Unsure about the last time she had a bowel movement or flatus. Tolerated clear liquid diet this morning.  Slightly elevated LFT.  Leukocytosis improved.    Objective: Vitals:   12/13/22 1347 12/13/22 1515 12/13/22 1547 12/13/22 1637  BP: 98/69 113/64 112/70 113/79  Pulse: (!) 117 (!) 114 (!) 114 (!) 106  Resp: 16 16 19 18   Temp: 98.9 F (37.2 C)  98.6 F (37 C)   TempSrc: Oral   Oral   SpO2: 97% 98% 97% 99%  Weight:      Height:        Examination:  GENERAL: No apparent distress.  Nontoxic. HEENT: MMM.  Vision and hearing grossly intact.  NECK: Supple.  No apparent JVD.  RESP:  No IWOB.  Fair aeration bilaterally. CVS:  RRR. Heart sounds normal.  ABD/GI/GU: BS+. Abd slightly distended.  Mild RUQ tenderness. MSK/EXT:  Moves extremities. No apparent deformity. No edema.  SKIN: no apparent skin lesion or wound NEURO: Awake, alert and oriented appropriately.  No apparent focal neuro deficit. PSYCH: Calm.  No apparent distress.  Procedures:  11/14-EUS not successful due to food stuff in his stomach. 11/16-EGD/EUS.  Pathology pending. 11/17-percutaneous cholecystostomy tube  Microbiology summarized: None  Assessment and plan: New pancreatic head mass/possible GOO/gallbladder distention: Patient presented with unintentional weight loss, poor p.o. intake and fatigue.  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  CA 19-9 normal at 13.  Patient underwent EGD/EUS on 11/16.  Significant abdominal pain after procedure.  Repeat CT on 11/16 with concern for possible GOO, GB distention, biliary ductal dilation and venous thrombosis.  Patient is at risk for cholangitis. -Appreciate help by gastroenterology -IR consulted for percutaneous cholecystostomy tube for gallbladder decompression -May need stenting for GOO at some point.  So far no nausea or vomiting. -Continue IV ceftriaxone and Flagyl -Monitor CMP and CBC. -Continue IV fluid while n.p.o.   Portal vein/superior mesenteric/splenic vein thrombosis: This unclear if this is blood clot or infiltrating malignancy. -Per GI, okay to start  anticoagulation with IV heparin that can be turned off as needed for procedure   Hx alcoholic pancreatitis with pseudocyst: CT abdomen and pelvis as above.  Patient without significant  abdominal pain, nausea or vomiting.  No significant elevation of lipase or LFT.  CT and MRI as above.  Elevated AST/ALP/hyperbilirubinemia: Mild.  Imaging as above. -Continue monitoring  Profound hypokalemia: Resolved.  Metabolic alkalosis: Likely from GI loss.  Resolved.   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 -Counseled on smoking cessation -Not interested in nicotine patch   Emphysema: No respiratory symptoms -Encouraged smoking cessation -As needed DuoNebs  Severe malnutrition.  Likely due to #1 Body mass index is 19.49 kg/m. Nutrition Problem: Severe Malnutrition Etiology: chronic illness (pancreatitis) Signs/Symptoms: energy intake < or equal to 75% for > or equal to 1 month, severe fat depletion, moderate muscle depletion, percent weight loss Interventions: Boost Breeze, MVI, Education   DVT prophylaxis:  On full dose anticoagulation  Code Status: Full code Family Communication: None at bedside Level of care: Telemetry Status is: Inpatient Remains inpatient appropriate because: Pancreatic mass, GOO, GB distention and venous thrombosis   Final disposition: Likely home Consultants:  Gastroenterology Oncology Interventional radiology  55 minutes with more than 50% spent in reviewing records, counseling patient/family and coordinating care.   Sch Meds:  Scheduled Meds:  feeding supplement  1 Container Oral TID BM   gabapentin  900 mg Oral QHS   lidocaine  20 mL Intradermal Once   mirtazapine  45 mg Oral QHS   pantoprazole (PROTONIX) IV  40 mg Intravenous Q12H   QUEtiapine  100-200 mg Oral QHS   sucralfate  1 g Oral TID WC & HS   Continuous Infusions:  cefTRIAXone (ROCEPHIN)  IV 2 g (12/12/22 2035)   dextrose 5 % and 0.9 % NaCl 1,000 mL (12/13/22 0829)   metronidazole 500 mg (12/13/22 0543)    PRN  Meds:.acetaminophen **OR** acetaminophen, alum & mag hydroxide-simeth, bisacodyl, fentaNYL, HYDROmorphone (DILAUDID) injection, iohexol, ipratropium-albuterol, midazolam, ondansetron **OR** ondansetron (ZOFRAN) IV, oxyCODONE  Antimicrobials: Anti-infectives (From admission, onward)    Start     Dose/Rate Route Frequency Ordered Stop   12/12/22 2000  ciprofloxacin (CIPRO) tablet 500 mg  Status:  Discontinued        500 mg Oral 2 times daily 12/12/22 1006 12/12/22 1809   12/12/22 1830  cefTRIAXone (ROCEPHIN) 2 g in sodium chloride 0.9 % 100 mL IVPB        2 g 200 mL/hr over 30 Minutes Intravenous Every 24 hours 12/12/22 1809 12/17/22 1829   12/12/22 1830  metroNIDAZOLE (FLAGYL) IVPB 500 mg        500 mg 100 mL/hr over 60 Minutes Intravenous Every 12 hours 12/12/22 1809 12/17/22 1829        I have personally reviewed the following labs and images: CBC: Recent Labs  Lab 12/09/22 0531 12/10/22 0544 12/11/22 0552 12/12/22 1851 12/13/22 0611  WBC 7.5 6.8 7.6 24.9* 14.9*  HGB 12.4 12.6 13.5 15.5* 14.0  HCT 37.4 39.1 40.2 47.5* 43.0  MCV 101.6* 102.9* 100.8* 101.7* 102.4*  PLT 380 355 397 604* 469*   BMP &GFR Recent Labs  Lab 12/09/22 0531 12/09/22 1745 12/10/22 0544 12/11/22 0552 12/12/22 1054 12/12/22 1851 12/13/22 0611  NA 137 138 135 136 133* 132* 133*  K 2.1* 3.1* 3.2* 4.2 4.4 4.6 5.0  CL  94* 96* 102 102 100 96* 105  CO2 34* 33* 26 27 25 28 23   GLUCOSE 123* 114* 103* 119* 100* 139* 144*  BUN 10 9 5* 6* 11 15 17   CREATININE 0.55 0.47 0.40* 0.43* 0.60 0.61 0.76  CALCIUM 8.0* 8.3* 7.8* 8.6* 8.2* 8.2* 7.7*  MG 2.1 2.1 2.2 2.3  --   --  3.3*   Estimated Creatinine Clearance: 56.7 mL/min (by C-G formula based on SCr of 0.76 mg/dL). Liver & Pancreas: Recent Labs  Lab 12/09/22 0531 12/10/22 0544 12/11/22 0552 12/12/22 1851 12/13/22 0611  AST 18 19 38 43* 67*  ALT 12 12 21 18 26   ALKPHOS 67 77 231* 208* 188*  BILITOT 0.3 0.5 0.4 1.8* 1.5*  PROT 5.1* 5.0* 5.8*  6.4* 5.2*  ALBUMIN 2.5* 2.4* 2.7* 3.1* 2.4*   Recent Labs  Lab 12/08/22 1231 12/11/22 1940 12/12/22 1851 12/13/22 0611  LIPASE 57* 36 31 29  AMYLASE 42  --   --   --    No results for input(s): "AMMONIA" in the last 168 hours. Diabetic: No results for input(s): "HGBA1C" in the last 72 hours. Recent Labs  Lab 12/12/22 1158 12/12/22 1723 12/13/22 0009 12/13/22 0650 12/13/22 1213  GLUCAP 105* 124* 193* 156* 183*   Cardiac Enzymes: No results for input(s): "CKTOTAL", "CKMB", "CKMBINDEX", "TROPONINI" in the last 168 hours. No results for input(s): "PROBNP" in the last 8760 hours. Coagulation Profile: Recent Labs  Lab 12/13/22 0611  INR 1.3*   Thyroid Function Tests: No results for input(s): "TSH", "T4TOTAL", "FREET4", "T3FREE", "THYROIDAB" in the last 72 hours. Lipid Profile: No results for input(s): "CHOL", "HDL", "LDLCALC", "TRIG", "CHOLHDL", "LDLDIRECT" in the last 72 hours. Anemia Panel: No results for input(s): "VITAMINB12", "FOLATE", "FERRITIN", "TIBC", "IRON", "RETICCTPCT" in the last 72 hours. Urine analysis:    Component Value Date/Time   COLORURINE YELLOW 07/24/2021 0827   APPEARANCEUR HAZY (A) 07/24/2021 0827   LABSPEC 1.019 07/24/2021 0827   PHURINE 5.0 07/24/2021 0827   GLUCOSEU NEGATIVE 07/24/2021 0827   HGBUR NEGATIVE 07/24/2021 0827   BILIRUBINUR NEGATIVE 07/24/2021 0827   KETONESUR NEGATIVE 07/24/2021 0827   PROTEINUR TRACE (A) 07/24/2021 0827   UROBILINOGEN 0.2 01/23/2010 1750   NITRITE NEGATIVE 07/24/2021 0827   LEUKOCYTESUR NEGATIVE 07/24/2021 0827   Sepsis Labs: Invalid input(s): "PROCALCITONIN", "LACTICIDVEN"  Microbiology: No results found for this or any previous visit (from the past 240 hour(s)).  Radiology Studies: CT ABDOMEN PELVIS W CONTRAST  Result Date: 12/12/2022 CLINICAL DATA:  Acute abdominal pain,  pancreatitis, pancreatic pass EXAM: CT ABDOMEN AND PELVIS WITH CONTRAST TECHNIQUE: Multidetector CT imaging of the abdomen and  pelvis was performed using the standard protocol following bolus administration of intravenous contrast. RADIATION DOSE REDUCTION: This exam was performed according to the departmental dose-optimization program which includes automated exposure control, adjustment of the mA and/or kV according to patient size and/or use of iterative reconstruction technique. CONTRAST:  OMNIPAQUE IOHEXOL 300 MG/ML  SOLN COMPARISON:  12/09/2022, 12/08/2022 FINDINGS: Lower chest: No acute pleural or parenchymal lung disease. Stable pericardial effusion. Stable distal esophageal wall thickening. Small hiatal hernia. Hepatobiliary: Marked distension of the gallbladder is again noted, with intrahepatic duct dilation and proximal common bile duct distension, due to occlusion by the infiltrative pancreatic mass image previously. Heterogeneous appearance of the liver parenchyma within the inferior right lobe and extending along the gallbladder fossa could likely due to known portal vein thrombosis or geographic hepatic steatosis as seen on recent MRI. Multiple liver hypodensities are  unchanged, previously characterized on MRI. Pancreas: The infiltrative mass centered at the pancreatic head is again noted, measuring up to 6.1 x 4.4 cm, compatible with pancreatic neoplasm based on MRI findings. Upstream pancreatic parenchymal atrophy. Decreased fluid collections surrounding the pancreas, now measuring 5.6 x 2.5 cm image 36/2 and 2.4 x 2.2 cm reference image 44/2. Spleen: Normal in size without focal abnormality. Adrenals/Urinary Tract: Stable appearance of the adrenals and kidneys. Bladder is decompressed, limiting its evaluation. Stomach/Bowel: Prominent gastric distension again noted, with functional gastric outlet obstruction due to mass effect by the infiltrative pancreatic mass described above. Likely ulceration within the gastric antrum measuring up to 1.8 cm reference image 26/5. No evidence of perforation. No evidence of bowel  obstruction or ileus. Vascular/Lymphatic: Continued occlusion of the splenic vein, SMV, and proximal aspect of the portal vein again noted due to extrinsic compression by the infiltrative pancreatic mass. There is nonocclusive thrombus within the main portal vein near the bifurcation. Extensive venous collaterals are seen throughout the mesentery unchanged since prior exam. Stable aortic atherosclerosis. No discrete pathologic adenopathy identified. Numerous subcentimeter mesenteric lymph nodes adjacent to the pancreatic mass in the upper abdomen are nonspecific. Reproductive: Calcified uterine fibroids unchanged. No adnexal masses. Other: Free fluid surrounds the distended gallbladder within the right upper quadrant. No free intraperitoneal gas. No abdominal wall hernia. Musculoskeletal: No acute or destructive bony abnormalities. Reconstructed images demonstrate no additional findings. IMPRESSION: 1. Stable infiltrating pancreatic mass, with thrombosis of the splenic vein, SMV, and proximal portal vein. Nonocclusive thrombus within the main portal vein just proximal to the bifurcation. 2. Decreased size of the peripancreatic fluid collections as above. New simple appearing free fluid surrounding a distended gallbladder and right upper quadrant could reflect interval rupture of these peripancreatic fluid collections. 3. Continued gastric distension compatible with functional gastric outlet obstruction due to the infiltrating pancreatic mass. Suspected ulcer within the gastric antrum, without evidence of perforation. 4. Marked gallbladder distension, with intrahepatic and extrahepatic biliary duct dilation, due to downstream common bile duct obstruction due to the pancreatic mass as seen on recent MRI. No evidence of gallbladder wall thickening or calcified gallstones. 5.  Aortic Atherosclerosis (ICD10-I70.0). Electronically Signed   By: Sharlet Salina M.D.   On: 12/12/2022 17:56      Humphrey Guerreiro T[facility]  If 7PM-7AM, please contact night-coverage [email or web address] 12/13/2022, 4:52 PM

## 2022-12-13 NOTE — Consult Note (Addendum)
Chief Complaint: Patient was seen in consultation today for distended gallbladder, new pancreatic fluid collections and possible pancreatic mass  Referring Physician(s): Mansouraty, Vicente Serene, MD  Supervising Physician: Richarda Overlie  Patient Status: [facility] - In-pt  History of Present Illness: Kathryn Underwood is a 62 y.o. female with a past medical history significant for anxiety, depression, polysubstance abuse, ETOH pancreatitis, pancreatic pseudocyst who presented to [facility] 12/08/22 with complaints of decreased appetite for several months, weight loss and fatigue. She underwent CT chest/abd/pelvis w/contrast which showed:  1. Very extensive, expansile soft tissue, fat stranding, and cystic change throughout the pancreatic head, encasing multiple adjacent structures including the portal vein and central mesenteric vessels, the superior mesenteric artery, and portions of the descending duodenum. Maximum apparent cross-section of the pancreatic head is 6.4 x 5.3 cm. 2. Severe intrahepatic biliary ductal dilatation with a distended gallbladder. 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 3. Multiple peripancreatic fluid collections, including a collection overlying the pancreatic neck and body measuring 6.9 x 5.3 cm, and extending inferiorly from the lateral pancreatic head measuring 4.0 x 3.5 cm, of uncertain acuity, consistent with acute pancreatic fluid collections or pseudocysts. The presence or absence of infection within this fluid is not established by imaging. 4. Constellation of findings is of uncertain significance, highly concerning for pancreatic adenocarcinoma complicated by pancreatitis, however at least conceivably this could reflect benign, although unusually severe acute on  chronic pancreatitis. 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 5. No evidence of lymphadenopathy or metastatic disease in the chest, abdomen, or pelvis. 6. Emphysema. 7. Coronary artery disease.  She was admitted to [facility] for further evaluation and underwent EUS 11/14 which was not successful due to large amount of retained food within the stomach. She then underwent EUS again 11/16 which showed anechoic lesions suggestive of cysts in the pancreatic head, neck and body and aspiration was performed of the fluid collections of the pancreatic head and neck with fluid from the neck portion appearing to be bilious. She also underwent FNA of the hypoechoic pancreatic tail lesion. Overt visualization of the bile duct system was not able to be performed due to significant inflammation leading to a deformity in the duodenum which prevented the therapeutic endoscope to traverse beyond the duodenal bulb.   Post procedure the patient developed severe abdominal pain and a CT abd/pelvis w/contrast was obtained which showed:  1. Stable infiltrating pancreatic mass, with thrombosis of the splenic vein, SMV, and proximal portal vein. Nonocclusive thrombus within the main portal vein just proximal to the bifurcation. 2. Decreased size of the peripancreatic fluid collections as above. New simple appearing free fluid surrounding a distended gallbladder and right upper quadrant could reflect interval rupture of these peripancreatic fluid collections. 3. Continued gastric distension compatible with functional gastric outlet obstruction due to the infiltrating pancreatic mass. Suspected ulcer within the gastric antrum, without evidence of perforation. 4. Marked gallbladder distension, with intrahepatic and extrahepatic biliary duct dilation, due to  downstream common bile duct obstruction due to the pancreatic mass as seen on recent MRI. No evidence of gallbladder wall thickening or calcified gallstones. 5.  Aortic Atherosclerosis (ICD10-I70.0).  IR has been consulted for percutaneous cholecystostomy placement.  Ms.  Horacek was seen at bedside this afternoon, she reports mild abdominal pain which is relieved with pain medication. She is understandably overwhelmed with the events of the last week or so but is trying to take everything one day at a time. She understands the procedure today and is agreeable to proceed.  Past Medical History:  Diagnosis Date   Alcohol abuse    Anxiety    Chickenpox    Depression    Neuromuscular disorder (HCC)    neuropathy   Substance abuse (HCC)     Past Surgical History:  Procedure Laterality Date   CESAREAN SECTION     ESOPHAGOGASTRODUODENOSCOPY N/A 08/14/2021   Procedure: ESOPHAGOGASTRODUODENOSCOPY (EGD);  Surgeon: Rachael Fee, MD;  Location: [facility];  Service: Gastroenterology;  Laterality: N/A;   EUS N/A 08/14/2021   Procedure: UPPER ENDOSCOPIC ULTRASOUND (EUS) RADIAL;  Surgeon: Rachael Fee, MD;  Location: [facility];  Service: Gastroenterology;  Laterality: N/A;   FINE NEEDLE ASPIRATION N/A 08/14/2021   Procedure: FINE NEEDLE ASPIRATION (FNA) LINEAR;  Surgeon: Rachael Fee, MD;  Location: [facility];  Service: Gastroenterology;  Laterality: N/A;   TONSILLECTOMY      Allergies: Patient has no known allergies.  Medications: Prior to Admission medications   Medication Sig Start Date End Date Taking? Authorizing Provider  gabapentin (NEURONTIN) 300 MG capsule Take 2 capsules (600 mg total) by mouth 2 (two) times daily. Patient taking differently: Take 900 mg by mouth at bedtime. 10/09/17  Yes Rolan Bucco, MD  mirtazapine (REMERON) 45 MG tablet Take 45 mg by mouth at bedtime. 11/05/21  Yes [provider]  QUEtiapine (SEROQUEL) 100 MG tablet Take 100-200 mg  by mouth at bedtime.   Yes [provider]  albuterol (VENTOLIN HFA) 108 (90 Base) MCG/ACT inhaler Inhale 2 puffs into the lungs every 6 (six) hours as needed for wheezing or shortness of breath. Patient not taking: Reported on 12/08/2022 08/06/21   Burnadette Pop, MD  folic acid (FOLVITE) 1 MG tablet Take 1 tablet (1 mg total) by mouth daily. Patient not taking: Reported on 12/08/2022 08/07/21   Burnadette Pop, MD  mirtazapine (REMERON) 15 MG tablet Take 1 tablet (15 mg total) by mouth at bedtime. Patient not taking: Reported on 12/08/2022 08/06/21   Burnadette Pop, MD  mometasone-formoterol (DULERA) 200-5 MCG/ACT AERO Inhale 2 puffs into the lungs 2 (two) times daily. Patient not taking: Reported on 12/08/2022 08/06/21   Burnadette Pop, MD  QUEtiapine (SEROQUEL) 25 MG tablet Take 1 tablet (25 mg total) by mouth at bedtime. Patient not taking: Reported on 12/08/2022 08/06/21   Burnadette Pop, MD  thiamine 100 MG tablet Take 1 tablet (100 mg total) by mouth daily. Patient not taking: Reported on 09/12/2021 08/07/21   Burnadette Pop, MD     Family History  Problem Relation Age of Onset   Dementia Mother    Arthritis Father    Asthma Father    Asthma Brother    Diabetes Maternal Grandmother    Dementia Maternal Grandmother    Diabetes Paternal Grandmother    Parkinson's disease Maternal Grandfather    Parkinson's disease Paternal Grandfather    Breast cancer Neg Hx    Colon cancer Neg Hx     Social History   Socioeconomic History   Marital status: Divorced    Spouse name: Not on file   Number of children: Not on file   Years of education: Not on file   Highest education level: Not on file  Occupational  History   Not on file  Tobacco Use   Smoking status: Some Days    Current packs/day: 0.50    Types: Cigarettes   Smokeless tobacco: Never  Vaping Use   Vaping status: Never Used  Substance and Sexual Activity   Alcohol use: Not Currently    Alcohol/week: 16.0  standard drinks of alcohol    Types: 16 Glasses of wine per week    Comment: daily (1.5 liter bottle wine every 12)   Drug use: Not Currently    Types: Cocaine, Marijuana   Sexual activity: Yes    Partners: Male    Comment: female partner  Other Topics Concern   Not on file  Social History Narrative   Divorced. Homemaker. HS grad.    Lives with life partner (stan)   Drinks caffeine.   Wears seatbelt. Smoke dectector in the home. No firearms in the home.    Feels safe in her relationships.     Social Determinants of Health   Financial Resource Strain: Not on file  Food Insecurity: No Food Insecurity (12/08/2022)   Hunger Vital Sign    Worried About Running Out of Food in the Last Year: Never true    Ran Out of Food in the Last Year: Never true  Transportation Needs: No Transportation Needs (12/08/2022)   PRAPARE - Administrator, Civil Service (Medical): No    Lack of Transportation (Non-Medical): No  Physical Activity: Not on file  Stress: Not on file  Social Connections: Not on file     Review of Systems: A 12 point ROS discussed and pertinent positives are indicated in the HPI above.  All other systems are negative.  Review of Systems  Constitutional:  Positive for fatigue. Negative for chills and fever.  Respiratory:  Negative for cough and shortness of breath.   Cardiovascular:  Negative for chest pain.  Gastrointestinal:  Positive for abdominal pain (Mild, resolved with IV pain medications). Negative for diarrhea, nausea and vomiting.  Musculoskeletal:  Negative for back pain.  Neurological:  Negative for dizziness and headaches.    Vital Signs: BP 101/71 (BP Location: Left Arm)   Pulse (!) 104   Temp (!) 97.4 F (36.3 C) (Oral)   Resp 14   Ht 5\' 3"  (1.6 m)   Wt 110 lb (49.9 kg)   SpO2 99%   BMI 19.49 kg/m   Physical Exam Vitals and nursing note reviewed.  Constitutional:      General: She is not in acute distress. HENT:     Head:  Normocephalic.     Mouth/Throat:     Mouth: Mucous membranes are moist.     Pharynx: Oropharynx is clear. No oropharyngeal exudate or posterior oropharyngeal erythema.  Eyes:     General: No scleral icterus. Cardiovascular:     Rate and Rhythm: Regular rhythm. Tachycardia present.  Pulmonary:     Effort: Pulmonary effort is normal.     Breath sounds: Normal breath sounds.  Abdominal:     General: There is no distension.     Palpations: Abdomen is soft.     Tenderness: There is no abdominal tenderness.  Skin:    General: Skin is warm and dry.     Coloration: Skin is not jaundiced.  Neurological:     Mental Status: She is alert and oriented to person, place, and time.  Psychiatric:        Mood and Affect: Mood normal.  Behavior: Behavior normal.        Thought Content: Thought content normal.        Judgment: Judgment normal.      MD Evaluation Airway: WNL Heart: WNL Abdomen: WNL Abdomen comments: 1-2/10 abdominal pain ASA  Classification: 3 Mallampati/Airway Score: Two   Imaging: CT ABDOMEN PELVIS W CONTRAST  Result Date: 12/12/2022 CLINICAL DATA:  Acute abdominal pain,  pancreatitis, pancreatic pass EXAM: CT ABDOMEN AND PELVIS WITH CONTRAST TECHNIQUE: Multidetector CT imaging of the abdomen and pelvis was performed using the standard protocol following bolus administration of intravenous contrast. RADIATION DOSE REDUCTION: This exam was performed according to the departmental dose-optimization program which includes automated exposure control, adjustment of the mA and/or kV according to patient size and/or use of iterative reconstruction technique. CONTRAST:  OMNIPAQUE IOHEXOL 300 MG/ML  SOLN COMPARISON:  12/09/2022, 12/08/2022 FINDINGS: Lower chest: No acute pleural or parenchymal lung disease. Stable pericardial effusion. Stable distal esophageal wall thickening. Small hiatal hernia. Hepatobiliary: Marked distension of the gallbladder is again noted, with  intrahepatic duct dilation and proximal common bile duct distension, due to occlusion by the infiltrative pancreatic mass image previously. Heterogeneous appearance of the liver parenchyma within the inferior right lobe and extending along the gallbladder fossa could likely due to known portal vein thrombosis or geographic hepatic steatosis as seen on recent MRI. Multiple liver hypodensities are unchanged, previously characterized on MRI. Pancreas: The infiltrative mass centered at the pancreatic head is again noted, measuring up to 6.1 x 4.4 cm, compatible with pancreatic neoplasm based on MRI findings. Upstream pancreatic parenchymal atrophy. Decreased fluid collections surrounding the pancreas, now measuring 5.6 x 2.5 cm image 36/2 and 2.4 x 2.2 cm reference image 44/2. Spleen: Normal in size without focal abnormality. Adrenals/Urinary Tract: Stable appearance of the adrenals and kidneys. Bladder is decompressed, limiting its evaluation. Stomach/Bowel: Prominent gastric distension again noted, with functional gastric outlet obstruction due to mass effect by the infiltrative pancreatic mass described above. Likely ulceration within the gastric antrum measuring up to 1.8 cm reference image 26/5. No evidence of perforation. No evidence of bowel obstruction or ileus. Vascular/Lymphatic: Continued occlusion of the splenic vein, SMV, and proximal aspect of the portal vein again noted due to extrinsic compression by the infiltrative pancreatic mass. There is nonocclusive thrombus within the main portal vein near the bifurcation. Extensive venous collaterals are seen throughout the mesentery unchanged since prior exam. Stable aortic atherosclerosis. No discrete pathologic adenopathy identified. Numerous subcentimeter mesenteric lymph nodes adjacent to the pancreatic mass in the upper abdomen are nonspecific. Reproductive: Calcified uterine fibroids unchanged. No adnexal masses. Other: Free fluid surrounds the distended  gallbladder within the right upper quadrant. No free intraperitoneal gas. No abdominal wall hernia. Musculoskeletal: No acute or destructive bony abnormalities. Reconstructed images demonstrate no additional findings. IMPRESSION: 1. Stable infiltrating pancreatic mass, with thrombosis of the splenic vein, SMV, and proximal portal vein. Nonocclusive thrombus within the main portal vein just proximal to the bifurcation. 2. Decreased size of the peripancreatic fluid collections as above. New simple appearing free fluid surrounding a distended gallbladder and right upper quadrant could reflect interval rupture of these peripancreatic fluid collections. 3. Continued gastric distension compatible with functional gastric outlet obstruction due to the infiltrating pancreatic mass. Suspected ulcer within the gastric antrum, without evidence of perforation. 4. Marked gallbladder distension, with intrahepatic and extrahepatic biliary duct dilation, due to downstream common bile duct obstruction due to the pancreatic mass as seen on recent MRI. No evidence of gallbladder wall thickening  or calcified gallstones. 5.  Aortic Atherosclerosis (ICD10-I70.0). Electronically Signed   By: Sharlet Salina M.D.   On: 12/12/2022 17:56   MR ABDOMEN MRCP W WO CONTAST  Result Date: 12/09/2022 CLINICAL DATA:  Pancreatitis with a pancreatic mass. EXAM: MRI ABDOMEN WITHOUT AND WITH CONTRAST (INCLUDING MRCP) TECHNIQUE: Multiplanar multisequence MR imaging of the abdomen was performed both before and after the administration of intravenous contrast. Heavily T2-weighted images of the biliary and pancreatic ducts were obtained, and three-dimensional MRCP images were rendered by post processing. CONTRAST:  5mL GADAVIST GADOBUTROL 1 MMOL/ML IV SOLN COMPARISON:  CT scans including 12/08/2022, 07/27/2021, 07/24/2021 for comparison. MRCP 08/03/2021. FINDINGS: Lower chest: Slight linear signal at the lung bases. Likely scar or atelectasis. Moderate  pericardial effusion again identified. Hepatobiliary: There are geographic areas of signal drop on out of phase imaging in the liver particularly involving segments 5, 6 and 4 consistent with a geographic areas of fatty infiltration. There is mosaic appearance to enhancement of the liver in the arterial phase as well. As seen on prior CT there is thrombus within the main portal vein with several upper abdominal collateral vessels. Separate multiple hepatic cysts identified which are predominantly under a cm in size. On series 27, image 11 in the dome of the right hepatic lobe segment 8 is a small enhancing nodule measuring 10 mm. This has mildly bright on T2 precontrast. This has an indeterminate lesion. There is moderate intrahepatic biliary ductal dilatation diffusely. The common hepatic duct has a diameter of 15 mm proximally with focal high-grade stricture over a long segment of the common duct extending down to the pancreatic head and ampulla. Gallbladder is dilated with layering sludge and possible stones. Pancreas: Dysmorphic appearance to the pancreas. They are smaller areas of residual pancreatic normal enhancement in the body and head/uncinate region. Centered along the neck and head region is a spiculated heterogeneous mass as seen on CT scan showing restricted diffusion. This has mass somewhat difficult to measure with the surrounding extensive other abnormality but is estimated in dimension on series 27, image 50 4.9 x 5.5 cm. This extends caudal to the pancreas into the central mesentery and is associated with occlusion of the splenic vein. There is encasement of the splenic artery. Extensive central mesenteric collaterals identified. There are several areas which has poor enhancement along the tail as well which could be the sequela of additional history of pancreatitis. The fluid collection extending anterior to the pancreas along the lesser sac on today's examination measures 6.0 by 5.2 cm. There  are several other smaller collections identified adjacent to this larger. There is also a significant fluid collection in the central mesentery just caudal to the head of the pancreas on series 26, image 25 measuring 5.0 by 3.1 cm. In principle, these has a differential but easily could be pseudocysts. No associated nodular wall enhancement. Spleen:  Within normal limits in size and appearance. Adrenals/Urinary Tract: No masses identified. No evidence of hydronephrosis. Stomach/Bowel: Debris noted in the nondilated stomach. The visualized small and large bowel are nondilated. Scattered colonic stool. Vascular/Lymphatic: Atherosclerotic changes identified. Normal caliber aorta and IVC. 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 The aforementioned mass lesion does appear to encase the SMA with some narrowing. Other:  Mild ascites. Musculoskeletal: Mild degenerative changes along the spine. IMPRESSION: Large locally aggressive heterogeneous soft tissue mass with spiculations identified centered of the neck and  head of the pancreas. Associated involvement of the SMA and SMV with the occlusion of the SMV and narrowing of the SMA. 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 Worrisome for pancreatic neoplasm or adenocarcinoma. Relatively poor visualization of the rest of the pancreas with the areas of distortion poor enhancement. This could be the sequela of neoplasm in the additional changes of pancreatitis. Several fluid collections are identified which could represent pseudo cysts with the largest extending along the lesser sac measuring up to 6 cm. Additional dominant collection seen caudal to the pancreatic head. Moderate intrahepatic biliary ductal dilatation with focal presumed malignant stricture of the distal common duct with enhancement and shouldering/nodularity along the  course of the duct. The gallbladder is also dilated with layering sludge and or stones. Multiple cystic liver lesions identified. There is 1 enhancing lesion in the dome in segment 8 which is indeterminate. With the pancreatic findings this could be spread of disease but would has a differential recommend attention on short follow-up. Geographic areas of fatty liver infiltration which can relate to the hepatic blood flow abnormalities. Electronically Signed   By: Karen Kays M.D.   On: 12/09/2022 14:48   MR 3D Recon At Scanner  Result Date: 12/09/2022 CLINICAL DATA:  Pancreatitis with a pancreatic mass. EXAM: MRI ABDOMEN WITHOUT AND WITH CONTRAST (INCLUDING MRCP) TECHNIQUE: Multiplanar multisequence MR imaging of the abdomen was performed both before and after the administration of intravenous contrast. Heavily T2-weighted images of the biliary and pancreatic ducts were obtained, and three-dimensional MRCP images were rendered by post processing. CONTRAST:  5mL GADAVIST GADOBUTROL 1 MMOL/ML IV SOLN COMPARISON:  CT scans including 12/08/2022, 07/27/2021, 07/24/2021 for comparison. MRCP 08/03/2021. FINDINGS: Lower chest: Slight linear signal at the lung bases. Likely scar or atelectasis. Moderate pericardial effusion again identified. Hepatobiliary: There are geographic areas of signal drop on out of phase imaging in the liver particularly involving segments 5, 6 and 4 consistent with a geographic areas of fatty infiltration. There is mosaic appearance to enhancement of the liver in the arterial phase as well. As seen on prior CT there is thrombus within the main portal vein with several upper abdominal collateral vessels. Separate multiple hepatic cysts identified which are predominantly under a cm in size. On series 27, image 11 in the dome of the right hepatic lobe segment 8 is a small enhancing nodule measuring 10 mm. This has mildly bright on T2 precontrast. This has an indeterminate lesion. There is  moderate intrahepatic biliary ductal dilatation diffusely. The common hepatic duct has a diameter of 15 mm proximally with focal high-grade stricture over a long segment of the common duct extending down to the pancreatic head and ampulla. Gallbladder is dilated with layering sludge and possible stones. Pancreas: Dysmorphic appearance to the pancreas. They are smaller areas of residual pancreatic normal enhancement in the body and head/uncinate region. Centered along the neck and head region is a spiculated heterogeneous mass as seen on CT scan showing restricted diffusion. This has mass somewhat difficult to measure with the surrounding extensive other abnormality but is estimated in dimension on series 27, image 50 4.9 x 5.5 cm. This extends caudal to the pancreas into the central mesentery and is associated with occlusion of the splenic vein. There is encasement of the splenic artery. Extensive central mesenteric collaterals identified. There are several areas which has poor enhancement along the tail as well which could be the sequela of additional history of pancreatitis. The fluid collection extending anterior  to the pancreas along the lesser sac on today's examination measures 6.0 by 5.2 cm. There are several other smaller collections identified adjacent to this larger. There is also a significant fluid collection in the central mesentery just caudal to the head of the pancreas on series 26, image 25 measuring 5.0 by 3.1 cm. In principle, these has a differential but easily could be pseudocysts. No associated nodular wall enhancement. Spleen:  Within normal limits in size and appearance. Adrenals/Urinary Tract: No masses identified. No evidence of hydronephrosis. Stomach/Bowel: Debris noted in the nondilated stomach. The visualized small and large bowel are nondilated. Scattered colonic stool. Vascular/Lymphatic: Atherosclerotic changes identified. Normal caliber aorta and IVC. Once again as above there is  thrombus in the portal vein. There is occlusion of the SMV with the extensive collaterals throughout the mesentery. The aforementioned mass lesion does appear to encase the SMA with some narrowing. Other:  Mild ascites. Musculoskeletal: Mild degenerative changes along the spine. IMPRESSION: Large locally aggressive heterogeneous soft tissue mass with spiculations identified centered of the neck and head of the pancreas. Associated involvement of the SMA and SMV with the occlusion of the SMV and narrowing of the SMA. 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 Worrisome for pancreatic neoplasm or adenocarcinoma. Relatively poor visualization of the rest of the pancreas with the areas of distortion poor enhancement. This could be the sequela of neoplasm in the additional changes of pancreatitis. Several fluid collections are identified which could represent pseudo cysts with the largest extending along the lesser sac measuring up to 6 cm. Additional dominant collection seen caudal to the pancreatic head. 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 The gallbladder is also dilated with layering sludge and or stones. Multiple cystic liver lesions identified. There is 1 enhancing lesion in the dome in segment 8 which is indeterminate. With the pancreatic findings this could be spread of disease but would has a differential recommend attention on short follow-up. Geographic areas of fatty liver infiltration which can relate to the hepatic blood flow abnormalities. Electronically Signed   By: Karen Kays M.D.   On: 12/09/2022 14:48   CT CHEST ABDOMEN PELVIS W CONTRAST  Result Date: 12/08/2022 CLINICAL DATA:  Dysphagia, decreasing appetite and weight loss * Tracking Code: BO * EXAM: CT CHEST, ABDOMEN, AND PELVIS WITH CONTRAST TECHNIQUE:  Multidetector CT imaging of the chest, abdomen and pelvis was performed following the standard protocol during bolus administration of intravenous contrast. RADIATION DOSE REDUCTION: This exam was performed according to the departmental dose-optimization program which includes automated exposure control, adjustment of the mA and/or kV according to patient size and/or use of iterative reconstruction technique. CONTRAST:  75mL OMNIPAQUE IOHEXOL 300 MG/ML  SOLN COMPARISON:  CT chest, 07/24/2021, abdomen pelvis, 07/27/2021 FINDINGS: CT CHEST FINDINGS Cardiovascular: Scattered aortic atherosclerosis. Normal heart size. Left coronary artery calcifications. Small pericardial effusion. Mediastinum/Nodes: No enlarged mediastinal, hilar, or axillary lymph nodes. Thyroid gland, trachea, and esophagus demonstrate no significant findings. Lungs/Pleura: Diminished ground-glass opacity of the medial left apex measuring 0.7 cm, previously 1.0 cm, nonspecific and infectious or inflammatory requiring no specific further follow-up (series 4, image 37). Mild centrilobular emphysema. Benign, bandlike scarring of the left lung base. No pleural effusion or pneumothorax. Musculoskeletal: No chest wall abnormality. No acute osseous findings. CT ABDOMEN PELVIS FINDINGS Hepatobiliary: Heterogeneous perfusion of the liver. Severe intra hepatic biliary ductal dilatation with a  distended gallbladder. 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 Partial cavernous transformation. Pancreas: Very extensive, expansile soft tissue, fat stranding, and cystic change throughout the pancreatic head, encasing multiple adjacent structures including the portal vein and central mesenteric vessels, the superior mesenteric artery, and portions of the descending duodenum (series 2, image 66). Maximum  apparent cross-section of the pancreatic head is 6.4 x 5.3 cm (series 2, image 71). Severe atrophy of the distal pancreatic parenchyma as well as diffuse ductal dilatation distally. Multiple fluid collections, including a collection overlying the pancreatic neck and body measuring 6.9 x 5.3 cm (series 2, image 69), and extending inferiorly from the lateral pancreatic head measuring 4.0 x 3.5 cm (series 2, image 77). Spleen: Normal in size without significant abnormality. Adrenals/Urinary Tract: Adenomatous thickening of the adrenal glands, requiring no specific further follow-up or characterization. Kidneys are normal, without renal calculi, solid lesion, or hydronephrosis. Bladder is unremarkable. Stomach/Bowel: Stomach is within normal limits. Appendix appears normal. No evidence of bowel wall thickening, distention, or inflammatory changes. Vascular/Lymphatic: Aortic atherosclerosis. No enlarged abdominal or pelvic lymph nodes. Reproductive: Calcified uterine fibroids. Other: No abdominal wall hernia or abnormality. No ascites. Musculoskeletal: No acute osseous findings. IMPRESSION: 1. Very extensive, expansile soft tissue, fat stranding, and cystic change throughout the pancreatic head, encasing multiple adjacent structures including the portal vein and central mesenteric vessels, the superior mesenteric artery, and portions of the descending duodenum. Maximum apparent cross-section of the pancreatic head is 6.4 x 5.3 cm. 2. Severe intrahepatic biliary ductal dilatation with a distended gallbladder. 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 3. Multiple peripancreatic fluid collections, including a collection overlying the pancreatic neck and body measuring 6.9 x 5.3 cm, and extending inferiorly from the lateral pancreatic  head measuring 4.0 x 3.5 cm, of uncertain acuity, consistent with acute pancreatic fluid collections or pseudocysts. The presence or absence of infection within this fluid is not established by imaging. 4. Constellation of findings is of uncertain significance, highly concerning for pancreatic adenocarcinoma complicated by pancreatitis, however at least conceivably this could reflect benign, although unusually severe acute on chronic pancreatitis. 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 5. No evidence of lymphadenopathy or metastatic disease in the chest, abdomen, or pelvis. 6. Emphysema. 7. Coronary artery disease. Aortic Atherosclerosis (ICD10-I70.0) and Emphysema (ICD10-J43.9). Electronically Signed   By: Jearld Lesch M.D.   On: 12/08/2022 14:56    Labs:  CBC: Recent Labs    12/10/22 0544 12/11/22 0552 12/12/22 1851 12/13/22 0611  WBC 6.8 7.6 24.9* 14.9*  HGB 12.6 13.5 15.5* 14.0  HCT 39.1 40.2 47.5* 43.0  PLT 355 397 604* 469*    COAGS: Recent Labs    12/13/22 0611  INR 1.3*    BMP: Recent Labs    12/11/22 0552 12/12/22 1054 12/12/22 1851 12/13/22 0611  NA 136 133* 132* 133*  K 4.2 4.4 4.6 5.0  CL 102 100 96* 105  CO2 27 25 28 23   GLUCOSE 119* 100* 139* 144*  BUN 6* 11 15 17   CALCIUM 8.6*  8.2* 8.2* 7.7*  CREATININE 0.43* 0.60 0.61 0.76  GFRNONAA >60 >60 >60 >60    LIVER FUNCTION TESTS: Recent Labs    12/10/22 0544 12/11/22 0552 12/12/22 1851 12/13/22 0611  BILITOT 0.5 0.4 1.8* 1.5*  AST 19 38 43* 67*  ALT 12 21 18 26   ALKPHOS 77 231* 208* 188*  PROT 5.0* 5.8* 6.4* 5.2*  ALBUMIN 2.4* 2.7* 3.1* 2.4*    TUMOR MARKERS: No results for input(s): "AFPTM", "CEA", "CA199", "CHROMGRNA" in the last 8760 hours.  Assessment and Plan:  62 y/o F with history of ETOH pancreatitis, pancreatic  pseudocyst who presented to the ED 12/08/22 with complaints of weight loss, poor appetite and fatigue. She was noted to have a possible pancreatic mass with vascular and biliary involvement as well multiple likely pancreatic cysts. She underwent EUS 12/12/22 with aspiration of fluid from cysts in the pancreatic head and neck, with the fluid from the pancreatic neck appearing bilious as well as FNA of the pancreatic tail lesion. She was noted to have significant inflammation preventing complete visualization of the bile duct system as well as developing gastric outlet obstruction.  Post EUS she developed severe abdominal pain and underwent CT abd/pelvis which showed new simple appearing free fluid surrounding a distended gallbladder and right upper quadrant as well as intrahepatic and extrahepatic biliary duct dilation.   IR has been consulted for image guided percutaneous cholecystostomy - patient history and imaging reviewed by Dr. Lowella Dandy who approves patient for procedure later today vs tomorrow depending on IR availability and patient stability. Patient last PO intake today at around 11 am (sips of boost breeze - clear liquid), VSS.  Plan: - Remain NPO in case can proceed with drain placement today - CBC/INR obtained this morning reviewed and within acceptable limits - Heparin gtt on hold, no other anticoagulation/antiplatelet medications - IR will call for patient when ready  Risks and benefits were discussed with the patient including, but not limited to, bleeding, infection, gallbladder perforation, bile leak, sepsis or even death.  All of the patient's questions were answered, patient is agreeable to proceed.  Consent signed and in chart.   Thank you for this interesting consult.  I greatly enjoyed meeting Kathryn Underwood and look forward to participating in their care.  A copy of this report was sent to the requesting provider on this date.  Electronically Signed: Villa Herb,  PA-C 12/13/2022, 11:37 AM   I spent a total of 40 Minutes in face to face in clinical consultation, greater than 50% of which was counseling/coordinating care for distended gallbladder.

## 2022-12-14 ENCOUNTER — Encounter (HOSPITAL_COMMUNITY): Payer: Self-pay | Admitting: Gastroenterology

## 2022-12-14 DIAGNOSIS — K311 Adult hypertrophic pyloric stenosis: Secondary | ICD-10-CM | POA: Diagnosis not present

## 2022-12-14 DIAGNOSIS — K831 Obstruction of bile duct: Secondary | ICD-10-CM | POA: Diagnosis not present

## 2022-12-14 DIAGNOSIS — K8689 Other specified diseases of pancreas: Secondary | ICD-10-CM | POA: Diagnosis not present

## 2022-12-14 DIAGNOSIS — R7989 Other specified abnormal findings of blood chemistry: Secondary | ICD-10-CM | POA: Diagnosis not present

## 2022-12-14 LAB — COMPREHENSIVE METABOLIC PANEL
ALT: 36 U/L (ref 0–44)
AST: 46 U/L — ABNORMAL HIGH (ref 15–41)
Albumin: 2.1 g/dL — ABNORMAL LOW (ref 3.5–5.0)
Alkaline Phosphatase: 194 U/L — ABNORMAL HIGH (ref 38–126)
Anion gap: 6 (ref 5–15)
BUN: 15 mg/dL (ref 8–23)
CO2: 22 mmol/L (ref 22–32)
Calcium: 8.2 mg/dL — ABNORMAL LOW (ref 8.9–10.3)
Chloride: 103 mmol/L (ref 98–111)
Creatinine, Ser: 0.58 mg/dL (ref 0.44–1.00)
GFR, Estimated: >60 mL/min (ref >60)
Glucose, Bld: 104 mg/dL — ABNORMAL HIGH (ref 70–99)
Potassium: 4.3 mmol/L (ref 3.5–5.1)
Sodium: 131 mmol/L — ABNORMAL LOW (ref 135–145)
Total Bilirubin: 1.3 mg/dL — ABNORMAL HIGH (ref <1.2)
Total Protein: 4.9 g/dL — ABNORMAL LOW (ref 6.5–8.1)

## 2022-12-14 LAB — CBC
HCT: 36.4 % (ref 36.0–46.0)
Hemoglobin: 12.1 g/dL (ref 12.0–15.0)
MCH: 33.9 pg (ref 26.0–34.0)
MCHC: 33.2 g/dL (ref 30.0–36.0)
MCV: 102 fL — ABNORMAL HIGH (ref 80.0–100.0)
Platelets: 352 10*3/uL (ref 150–400)
RBC: 3.57 MIL/uL — ABNORMAL LOW (ref 3.87–5.11)
RDW: 13.2 % (ref 11.5–15.5)
WBC: 10.1 10*3/uL (ref 4.0–10.5)
nRBC: 0 % (ref 0.0–0.2)

## 2022-12-14 LAB — HEPARIN LEVEL (UNFRACTIONATED): Heparin Unfractionated: 0.19 [IU]/mL — ABNORMAL LOW (ref 0.30–0.70)

## 2022-12-14 LAB — GLUCOSE, CAPILLARY
Glucose-Capillary: 104 mg/dL — ABNORMAL HIGH (ref 70–99)
Glucose-Capillary: 109 mg/dL — ABNORMAL HIGH (ref 70–99)
Glucose-Capillary: 133 mg/dL — ABNORMAL HIGH (ref 70–99)
Glucose-Capillary: 91 mg/dL (ref 70–99)

## 2022-12-14 LAB — MAGNESIUM: Magnesium: 2.5 mg/dL — ABNORMAL HIGH (ref 1.7–2.4)

## 2022-12-14 LAB — PHOSPHORUS: Phosphorus: 1.6 mg/dL — ABNORMAL LOW (ref 2.5–4.6)

## 2022-12-14 MED ORDER — HEPARIN (PORCINE) 25000 UT/250ML-% IV SOLN
INTRAVENOUS | Status: AC
  Administered 2022-12-14: 850 [IU]/h via INTRAVENOUS
  Filled 2022-12-14: qty 250

## 2022-12-14 MED ORDER — HEPARIN (PORCINE) 25000 UT/250ML-% IV SOLN: 950.0000 [IU]/h | INTRAVENOUS | Status: DC

## 2022-12-14 MED ORDER — SODIUM PHOSPHATES 45 MMOLE/15ML IV SOLN
30.0000 mmol | Freq: Once | INTRAVENOUS | Status: AC
  Administered 2022-12-14: 30 mmol via INTRAVENOUS
  Filled 2022-12-14: qty 10

## 2022-12-14 NOTE — Plan of Care (Signed)
  Problem: Clinical Measurements: Goal: Ability to maintain clinical measurements within normal limits will improve Outcome: Progressing Goal: Will remain free from infection Outcome: Progressing Goal: Diagnostic test results will improve Outcome: Progressing Goal: Respiratory complications will improve Outcome: Progressing Goal: Cardiovascular complication will be avoided Outcome: Progressing   Problem: Nutrition: Goal: Adequate nutrition will be maintained Outcome: Progressing   Problem: Coping: Goal: Level of anxiety will decrease Outcome: Progressing   Problem: Elimination: Goal: Will not experience complications related to bowel motility Outcome: Progressing Goal: Will not experience complications related to urinary retention Outcome: Progressing   Problem: Pain Management: Goal: General experience of comfort will improve Outcome: Progressing

## 2022-12-14 NOTE — Progress Notes (Signed)
   12/14/22 1400  Spiritual Encounters  Type of Visit Initial  Care provided to: Patient  Referral source Chaplain team;Chaplain assessment  Reason for visit Urgent spiritual support  OnCall Visit No  Spiritual Framework  Presenting Themes Meaning/purpose/sources of inspiration;Values and beliefs;Significant life change;Coping tools;Impactful experiences and emotions;Courage hope and growth;Rituals and practive  Patient Stress Factors Exhausted;Family relationships;Financial concerns;Health changes;Lack of caregivers;Loss of control;Major life changes  Family Stress Factors Family relationships;Lack of knowledge  Interventions  Spiritual Care Interventions Made Established relationship of care and support;Compassionate presence;Reflective listening;Normalization of emotions;Decision-making support/facilitation;Narrative/life review;Prayer;Self-care teaching  Spiritual Care Plan  Spiritual Care Issues Still Outstanding Chaplain will continue to follow   Chaplain met with patient at bedside this afternoon.  She was alert and eager to talk.  She expressed her spiritual care and personal needs regarding a possible disgnosis . She spoke at length regarding her family dynamics and what she would like with her two adult chidlren.  She requested prayer and we prayed.

## 2022-12-14 NOTE — Progress Notes (Addendum)
Progress Note  Primary GI: Dr. Marina Goodell DOA: 12/08/2022         Hospital Day: 7   Subjective  Chief Complaint: Abdominal pain, weight loss, pancreatic mass  No family was present at the time of my evaluation. Patient sitting in bed had just finished broth states that she did well with that and is requesting a full liquid diet so she can have pudding. She states her abdomen is sore after PERC drain placement however overall she is feeling slightly improved.  She was able to pass gas yesterday has not had a bowel movement.    Objective   Vital signs in last 24 hours: Temp:  [97.7 F (36.5 C)-98.9 F (37.2 C)] 97.7 F (36.5 C) (11/18 0755) Pulse Rate:  [56-117] 56 (11/18 0755) Resp:  [14-19] 15 (11/18 0755) BP: (90-115)/(56-79) 90/56 (11/18 0755) SpO2:  [95 %-100 %] 96 % (11/18 0755) Last BM Date : 12/12/22 Last BM recorded by nurses in past 5 days No data recorded  General:   female in no acute distress  Heart:  Regular rate and rhythm; no murmurs Pulm: Clear anteriorly; no wheezing Abdomen:  Distended but soft, Active bowel sounds. mild tenderness in the entire abdomen. Without guarding and Without rebound, No organomegaly appreciated.  PERC drain right abdomen with 150 cc dark bile Extremities:  without  edema. Neurologic:  Alert and  oriented x4;  No focal deficits.  Psych:  Cooperative. Normal mood and affect.  Intake/Output from previous day: 11/17 0701 - 11/18 0700 In: 2128.2 [P.O.:474; I.V.:1090; IV Piggyback:564.2] Out: 460 [Drains:460] Intake/Output this shift: No intake/output data recorded.  Studies/Results: IR Perc Cholecystostomy  Addendum Date: 12/13/2022   ADDENDUM REPORT: 12/13/2022 20:18 ADDENDUM: Contrast: 5 mL Omnipaque 300 Electronically Signed   By: Richarda Overlie M.D.   On: 12/13/2022 20:18   Result Date: 12/13/2022 INDICATION: 62 year old with abdominal pain and severe gallbladder distension. New fluid around the gallbladder after upper endoscopy  and EUS. Concern for a pancreatic malignancy. EXAM: IMAGE GUIDED PERCUTANEOUS CHOLECYSTOSTOMY TUBE PLACEMENT MEDICATIONS: Inpatient and receiving IV antibiotics. ANESTHESIA/SEDATION: Moderate (conscious) sedation was employed during this procedure. A total of Versed 1.5 mg and Fentanyl 50 mcg was administered intravenously by the radiology nurse. Total intra-service moderate Sedation Time: 15 minutes. The patient's level of consciousness and vital signs were monitored continuously by radiology nursing throughout the procedure under my direct supervision. FLUOROSCOPY: Radiation Exposure Index (as provided by the fluoroscopic device): 0.1 mGy Kerma COMPLICATIONS: None immediate. PROCEDURE: Informed written consent was obtained from the patient after a thorough discussion of the procedural risks, benefits and alternatives. All questions were addressed. A timeout was performed prior to the initiation of the procedure. Patient was placed supine. The right side of the abdomen was prepped and draped in sterile fashion. Maximal barrier sterile technique was utilized including caps, mask, sterile gowns, sterile gloves, sterile drape, hand hygiene and skin antiseptic. Ultrasound demonstrated a very distended gallbladder. The skin was anesthetized using 1% lidocaine. Using ultrasound guidance, 21 gauge needle was directed into the gallbladder and a 0.018 wire was placed. A transitional dilator set was placed. Wire was advanced into the gallbladder and the tract was dilated to accommodate a 10 [place] multipurpose drain. Approximately 200 mL of dark green fluid was removed. The gallbladder was decompressed at the end of the procedure. Contrast injection confirmed placement in the gallbladder. Drain was sutured to skin and attached to a gravity bag. Fluid was sent for culture and cytology. Fluoroscopic and ultrasound  images were taken and saved for documentation. FINDINGS: Gallbladder was severely distended. Fluid and edematous  tissue around the gallbladder. Gallbladder was decompressed at the end of the procedure. 200 mL of fluid was removed. IMPRESSION: Successful image guided placement of a percutaneous cholecystostomy tube. Electronically Signed: By: Richarda Overlie M.D. On: 12/13/2022 19:47   CT ABDOMEN PELVIS W CONTRAST  Result Date: 12/12/2022 CLINICAL DATA:  Acute abdominal pain,  pancreatitis, pancreatic pass EXAM: CT ABDOMEN AND PELVIS WITH CONTRAST TECHNIQUE: Multidetector CT imaging of the abdomen and pelvis was performed using the standard protocol following bolus administration of intravenous contrast. RADIATION DOSE REDUCTION: This exam was performed according to the departmental dose-optimization program which includes automated exposure control, adjustment of the mA and/or kV according to patient size and/or use of iterative reconstruction technique. CONTRAST:  OMNIPAQUE IOHEXOL 300 MG/ML  SOLN COMPARISON:  12/09/2022, 12/08/2022 FINDINGS: Lower chest: No acute pleural or parenchymal lung disease. Stable pericardial effusion. Stable distal esophageal wall thickening. Small hiatal hernia. Hepatobiliary: Marked distension of the gallbladder is again noted, with intrahepatic duct dilation and proximal common bile duct distension, due to occlusion by the infiltrative pancreatic mass image previously. Heterogeneous appearance of the liver parenchyma within the inferior right lobe and extending along the gallbladder fossa could likely due to known portal vein thrombosis or geographic hepatic steatosis as seen on recent MRI. Multiple liver hypodensities are unchanged, previously characterized on MRI. Pancreas: The infiltrative mass centered at the pancreatic head is again noted, measuring up to 6.1 x 4.4 cm, compatible with pancreatic neoplasm based on MRI findings. Upstream pancreatic parenchymal atrophy. Decreased fluid collections surrounding the pancreas, now measuring 5.6 x 2.5 cm image 36/2 and 2.4 x 2.2 cm reference  image 44/2. Spleen: Normal in size without focal abnormality. Adrenals/Urinary Tract: Stable appearance of the adrenals and kidneys. Bladder is decompressed, limiting its evaluation. Stomach/Bowel: Prominent gastric distension again noted, with functional gastric outlet obstruction due to mass effect by the infiltrative pancreatic mass described above. Likely ulceration within the gastric antrum measuring up to 1.8 cm reference image 26/5. No evidence of perforation. No evidence of bowel obstruction or ileus. Vascular/Lymphatic: Continued occlusion of the splenic vein, SMV, and proximal aspect of the portal vein again noted due to extrinsic compression by the infiltrative pancreatic mass. There is nonocclusive thrombus within the main portal vein near the bifurcation. Extensive venous collaterals are seen throughout the mesentery unchanged since prior exam. Stable aortic atherosclerosis. No discrete pathologic adenopathy identified. Numerous subcentimeter mesenteric lymph nodes adjacent to the pancreatic mass in the upper abdomen are nonspecific. Reproductive: Calcified uterine fibroids unchanged. No adnexal masses. Other: Free fluid surrounds the distended gallbladder within the right upper quadrant. No free intraperitoneal gas. No abdominal wall hernia. Musculoskeletal: No acute or destructive bony abnormalities. Reconstructed images demonstrate no additional findings. IMPRESSION: 1. Stable infiltrating pancreatic mass, with thrombosis of the splenic vein, SMV, and proximal portal vein. Nonocclusive thrombus within the main portal vein just proximal to the bifurcation. 2. Decreased size of the peripancreatic fluid collections as above. New simple appearing free fluid surrounding a distended gallbladder and right upper quadrant could reflect interval rupture of these peripancreatic fluid collections. 3. Continued gastric distension compatible with functional gastric outlet obstruction due to the infiltrating  pancreatic mass. Suspected ulcer within the gastric antrum, without evidence of perforation. 4. Marked gallbladder distension, with intrahepatic and extrahepatic biliary duct dilation, due to downstream common bile duct obstruction due to the pancreatic mass as seen on recent MRI. No evidence  of gallbladder wall thickening or calcified gallstones. 5.  Aortic Atherosclerosis (ICD10-I70.0). Electronically Signed   By: Sharlet Salina M.D.   On: 12/12/2022 17:56    Lab Results: Recent Labs    12/12/22 1851 12/13/22 0611 12/14/22 0601  WBC 24.9* 14.9* 10.1  HGB 15.5* 14.0 12.1  HCT 47.5* 43.0 36.4  PLT 604* 469* 352   BMET Recent Labs    12/12/22 1851 12/13/22 0611 12/14/22 0601  NA 132* 133* 131*  K 4.6 5.0 4.3  CL 96* 105 103  CO2 28 23 22   GLUCOSE 139* 144* 104*  BUN 15 17 15   CREATININE 0.61 0.76 0.58  CALCIUM 8.2* 7.7* 8.2*   LFT Recent Labs    12/13/22 0611 12/14/22 0601  PROT 5.2* 4.9*  ALBUMIN 2.4* 2.1*  AST 67* 46*  ALT 26 36  ALKPHOS 188* 194*  BILITOT 1.5* 1.3*  BILIDIR 0.7*  --    PT/INR Recent Labs    12/13/22 0611  LABPROT 16.5*  INR 1.3*     Scheduled Meds:  feeding supplement  1 Container Oral TID BM   gabapentin  900 mg Oral QHS   lidocaine  20 mL Intradermal Once   mirtazapine  45 mg Oral QHS   pantoprazole (PROTONIX) IV  40 mg Intravenous Q12H   QUEtiapine  100-200 mg Oral QHS   sucralfate  1 g Oral TID WC & HS   Continuous Infusions:  cefTRIAXone (ROCEPHIN)  IV Stopped (12/13/22 1939)   dextrose 5 % and 0.9 % NaCl 100 mL/hr at 12/14/22 0537   metronidazole 100 mL/hr at 12/14/22 0537   sodium phosphate 30 mmol in dextrose 5 % 250 mL infusion        Patient profile:    62 y.o. female with a past medical history significant for anxiety, depression, GERD, neuropathy, alcoholic pancreatitis, pancreatic pseudocyst and alcohol abuse who presented to the ER due to decreased appetite over the past few months with associated weight loss,  concern for pancreatic mass. CT abdomen and pelvis showed expansile soft tissue, fat stranding and cystic change throughout the pancreatic head, encasing multiple adjacent structures including the portal vein and central mesenteric vessels, severe intrahepatic biliary ductal dilation with a distended gallbladder, portal vein effaced and occluded, multiple peripancreatic fluid collections including a collection overlying the pancreatic neck and body measuring 6.9 x 5.3 cm, discussed highly concerning for pancreatic adenocarcinoma complicated by pancreatitis    Impression/Plan:   Weight loss of 30 lbs over last year with abnormal CT of the pancreas concerning for adenocarcinoma, in setting of previous alcohol induced chronic pancreatitis CA 19-9 311 08/14/2021 EUS with Dr. Christella Hartigan no signs pancreatic mass or chronic pancreatitis 11/13 for abnormal CT of the pancreas new pancreatic mass and acute on chronic pancreatitis with multiple pseudocysts 11/13 MRCP large locally aggressive heterogeneous soft tissue mass with spiculations identified centered of the neck and head of the pancreas associated SMA, SMV and occlusion of SMV and narrowing of SMA worrisome for neoplasm/adenocarcinoma several fluid collections up to 6 cm moderate intrahepatic biliary ductal dilation with focal presumed malignant stricture of distal common bile duct with enhancement and shoulder nodularity along the course of the duct gallbladder dilated with layering sludge and stones 1 enhancing lesion in the dome of the liver segment 8 which is indeterminate 11/16 Redo EUS  foodstuffs  still limiting examination however progressive distal stomach/proximal duodenal inflammation and congestion area cannot be transversed with linear EUS scope or ERCP scope.  Sampled multiple cysts  as well as what seemed to be masslike soft tissue.  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  with elevated liver function  AST 46 (67) ALT 36 (26)  Alkphos 194 (188) TBili 1.3 (1.5) MRCP moderate intrahepatic biliary ductal dilation and focal presumed malignant stricture of distal common bile duct with nodularity along the course of the duct gallbladder dilated with sludge and stones 11/16 EUS unable to transverse distal stomach/proximal duodenum secondary to inflammation and congestion 11/17 IR PERC cholecystostomy secondary to bili obstruction 200 mL dark green fluid aspirated -Preliminary culture no WBC no organisms seen no growth less than 24 hours -LFTs and bilirubin decreasing slightly -Pending cytology  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  Secondary to  pancreatic mass - RD consult -Count calories, increase protein   Principal Problem:   Pancreatic mass Active Problems:   Alcohol-induced chronic  pancreatitis (HCC)   Protein malnutrition (HCC)   Unintentional weight loss   Esophageal dysphagia   Dilation of pancreatic duct   Protein-calorie malnutrition, severe   Acute esophagitis   Abnormal CT scan, gastrointestinal tract   High serum carbohydrate antigen 19-9 (CA19-9)   Pancreatic cyst   LOS: 6 days   Doree Albee  12/14/2022, 8:31 AM    Attending Physician Note   I have taken an interval history, reviewed the chart and examined the patient. I performed a substantive portion of this encounter, including complete performance of at least one of the key components, in conjunction with the APP. I agree with the APP's note, impression and recommendations with my edits. My additional impressions and recommendations are as follows.   Pancreas mass concerning for pancreatic cancer with a CBD stricture, gastric outlet obstruction and SMV, splenic vein and portal vein thrombosis. S/P cholecystostomy tube placement yesterday. EUS cytology pending.   Claudette Head, MD [facility] See [facility], [facility], for our on call provider

## 2022-12-14 NOTE — Progress Notes (Addendum)
Referring Physician(s): Dr. Morton Peters  Supervising Physician: Roanna Banning  Patient Status:  [facility] - In-pt  Chief Complaint:  Abdominal pain with severe gallbladder distention   Subjective:  Patient underwent percutaneous cholecystostomy tube placement 11/17 in IR by Dr. Lowella Dandy. Patient is alert and sitting upright in bed this morning. Daughter at bedside. Patient states she feels greatly improved, and has not required pain medication today as of time of rounds. Drain with good output overnight.    Allergies: Patient has no known allergies.  Medications: Prior to Admission medications   Medication Sig Start Date End Date Taking? Authorizing Provider  gabapentin (NEURONTIN) 300 MG capsule Take 2 capsules (600 mg total) by mouth 2 (two) times daily. Patient taking differently: Take 900 mg by mouth at bedtime. 10/09/17  Yes Rolan Bucco, MD  mirtazapine (REMERON) 45 MG tablet Take 45 mg by mouth at bedtime. 11/05/21  Yes [provider]  QUEtiapine (SEROQUEL) 100 MG tablet Take 100-200 mg by mouth at bedtime.   Yes [provider]  albuterol (VENTOLIN HFA) 108 (90 Base) MCG/ACT inhaler Inhale 2 puffs into the lungs every 6 (six) hours as needed for wheezing or shortness of breath. Patient not taking: Reported on 12/08/2022 08/06/21   Burnadette Pop, MD  folic acid (FOLVITE) 1 MG tablet Take 1 tablet (1 mg total) by mouth daily. Patient not taking: Reported on 12/08/2022 08/07/21   Burnadette Pop, MD  mirtazapine (REMERON) 15 MG tablet Take 1 tablet (15 mg total) by mouth at bedtime. Patient not taking: Reported on 12/08/2022 08/06/21   Burnadette Pop, MD  mometasone-formoterol (DULERA) 200-5 MCG/ACT AERO Inhale 2 puffs into the lungs 2 (two) times daily. Patient not taking: Reported on 12/08/2022 08/06/21   Burnadette Pop, MD  QUEtiapine (SEROQUEL) 25 MG tablet Take 1 tablet (25 mg total) by mouth at bedtime. Patient not taking: Reported on 12/08/2022  08/06/21   Burnadette Pop, MD  thiamine 100 MG tablet Take 1 tablet (100 mg total) by mouth daily. Patient not taking: Reported on 09/12/2021 08/07/21   Burnadette Pop, MD     Vital Signs: BP (!) 90/56 (BP Location: Left Arm)   Pulse (!) 56   Temp 97.7 F (36.5 C) (Oral)   Resp 15   Ht 5\' 3"  (1.6 m)   Wt 110 lb (49.9 kg)   SpO2 96%   BMI 19.49 kg/m   Physical Exam Abdominal:     General: There is no distension.     Palpations: Abdomen is soft.     Tenderness: There is no abdominal tenderness.     Comments: Drain site c/d/I. Minimal tenderness to palpation at drain site. Drain output 200cc, bilious. Flushes/aspirates easily.   Skin:    General: Skin is warm and dry.     Imaging: IR Perc Cholecystostomy  Addendum Date: 12/13/2022   ADDENDUM REPORT: 12/13/2022 20:18 ADDENDUM: Contrast: 5 mL Omnipaque 300 Electronically Signed   By: Richarda Overlie M.D.   On: 12/13/2022 20:18   Result Date: 12/13/2022 INDICATION: 62 year old with abdominal pain and severe gallbladder distension. New fluid around the gallbladder after upper endoscopy and EUS. Concern for a pancreatic malignancy. EXAM: IMAGE GUIDED PERCUTANEOUS CHOLECYSTOSTOMY TUBE PLACEMENT MEDICATIONS: Inpatient and receiving IV antibiotics. ANESTHESIA/SEDATION: Moderate (conscious) sedation was employed during this procedure. A total of Versed 1.5 mg and Fentanyl 50 mcg was administered intravenously by the radiology nurse. Total intra-service moderate Sedation Time: 15 minutes. The patient's level of consciousness and vital signs were monitored continuously by  radiology nursing throughout the procedure under my direct supervision. FLUOROSCOPY: Radiation Exposure Index (as provided by the fluoroscopic device): 0.1 mGy Kerma COMPLICATIONS: None immediate. PROCEDURE: Informed written consent was obtained from the patient after a thorough discussion of the procedural risks, benefits and alternatives. All questions were addressed. A  timeout was performed prior to the initiation of the procedure. Patient was placed supine. The right side of the abdomen was prepped and draped in sterile fashion. Maximal barrier sterile technique was utilized including caps, mask, sterile gowns, sterile gloves, sterile drape, hand hygiene and skin antiseptic. Ultrasound demonstrated a very distended gallbladder. The skin was anesthetized using 1% lidocaine. Using ultrasound guidance, 21 gauge needle was directed into the gallbladder and a 0.018 wire was placed. A transitional dilator set was placed. Wire was advanced into the gallbladder and the tract was dilated to accommodate a 10 [place] multipurpose drain. Approximately 200 mL of dark green fluid was removed. The gallbladder was decompressed at the end of the procedure. Contrast injection confirmed placement in the gallbladder. Drain was sutured to skin and attached to a gravity bag. Fluid was sent for culture and cytology. Fluoroscopic and ultrasound images were taken and saved for documentation. FINDINGS: Gallbladder was severely distended. Fluid and edematous tissue around the gallbladder. Gallbladder was decompressed at the end of the procedure. 200 mL of fluid was removed. IMPRESSION: Successful image guided placement of a percutaneous cholecystostomy tube. Electronically Signed: By: Richarda Overlie M.D. On: 12/13/2022 19:47   CT ABDOMEN PELVIS W CONTRAST  Result Date: 12/12/2022 CLINICAL DATA:  Acute abdominal pain,  pancreatitis, pancreatic pass EXAM: CT ABDOMEN AND PELVIS WITH CONTRAST TECHNIQUE: Multidetector CT imaging of the abdomen and pelvis was performed using the standard protocol following bolus administration of intravenous contrast. RADIATION DOSE REDUCTION: This exam was performed according to the departmental dose-optimization program which includes automated exposure control, adjustment of the mA and/or kV according to patient size and/or use of iterative reconstruction technique. CONTRAST:   OMNIPAQUE IOHEXOL 300 MG/ML  SOLN COMPARISON:  12/09/2022, 12/08/2022 FINDINGS: Lower chest: No acute pleural or parenchymal lung disease. Stable pericardial effusion. Stable distal esophageal wall thickening. Small hiatal hernia. Hepatobiliary: Marked distension of the gallbladder is again noted, with intrahepatic duct dilation and proximal common bile duct distension, due to occlusion by the infiltrative pancreatic mass image previously. Heterogeneous appearance of the liver parenchyma within the inferior right lobe and extending along the gallbladder fossa could likely due to known portal vein thrombosis or geographic hepatic steatosis as seen on recent MRI. Multiple liver hypodensities are unchanged, previously characterized on MRI. Pancreas: The infiltrative mass centered at the pancreatic head is again noted, measuring up to 6.1 x 4.4 cm, compatible with pancreatic neoplasm based on MRI findings. Upstream pancreatic parenchymal atrophy. Decreased fluid collections surrounding the pancreas, now measuring 5.6 x 2.5 cm image 36/2 and 2.4 x 2.2 cm reference image 44/2. Spleen: Normal in size without focal abnormality. Adrenals/Urinary Tract: Stable appearance of the adrenals and kidneys. Bladder is decompressed, limiting its evaluation. Stomach/Bowel: Prominent gastric distension again noted, with functional gastric outlet obstruction due to mass effect by the infiltrative pancreatic mass described above. Likely ulceration within the gastric antrum measuring up to 1.8 cm reference image 26/5. No evidence of perforation. No evidence of bowel obstruction or ileus. Vascular/Lymphatic: Continued occlusion of the splenic vein, SMV, and proximal aspect of the portal vein again noted due to extrinsic compression by the infiltrative pancreatic mass. There is nonocclusive thrombus within the main portal vein  near the bifurcation. Extensive venous collaterals are seen throughout the mesentery unchanged since prior  exam. Stable aortic atherosclerosis. No discrete pathologic adenopathy identified. Numerous subcentimeter mesenteric lymph nodes adjacent to the pancreatic mass in the upper abdomen are nonspecific. Reproductive: Calcified uterine fibroids unchanged. No adnexal masses. Other: Free fluid surrounds the distended gallbladder within the right upper quadrant. No free intraperitoneal gas. No abdominal wall hernia. Musculoskeletal: No acute or destructive bony abnormalities. Reconstructed images demonstrate no additional findings. IMPRESSION: 1. Stable infiltrating pancreatic mass, with thrombosis of the splenic vein, SMV, and proximal portal vein. Nonocclusive thrombus within the main portal vein just proximal to the bifurcation. 2. Decreased size of the peripancreatic fluid collections as above. New simple appearing free fluid surrounding a distended gallbladder and right upper quadrant could reflect interval rupture of these peripancreatic fluid collections. 3. Continued gastric distension compatible with functional gastric outlet obstruction due to the infiltrating pancreatic mass. Suspected ulcer within the gastric antrum, without evidence of perforation. 4. Marked gallbladder distension, with intrahepatic and extrahepatic biliary duct dilation, due to downstream common bile duct obstruction due to the pancreatic mass as seen on recent MRI. No evidence of gallbladder wall thickening or calcified gallstones. 5.  Aortic Atherosclerosis (ICD10-I70.0). Electronically Signed   By: Sharlet Salina M.D.   On: 12/12/2022 17:56    Labs:  CBC: Recent Labs    12/11/22 0552 12/12/22 1851 12/13/22 0611 12/14/22 0601  WBC 7.6 24.9* 14.9* 10.1  HGB 13.5 15.5* 14.0 12.1  HCT 40.2 47.5* 43.0 36.4  PLT 397 604* 469* 352    COAGS: Recent Labs    12/13/22 0611  INR 1.3*    BMP: Recent Labs    12/12/22 1054 12/12/22 1851 12/13/22 0611 12/14/22 0601  NA 133* 132* 133* 131*  K 4.4 4.6 5.0 4.3  CL 100 96* 105  103  CO2 25 28 23 22   GLUCOSE 100* 139* 144* 104*  BUN 11 15 17 15   CALCIUM 8.2* 8.2* 7.7* 8.2*  CREATININE 0.60 0.61 0.76 0.58  GFRNONAA >60 >60 >60 >60    LIVER FUNCTION TESTS: Recent Labs    12/11/22 0552 12/12/22 1851 12/13/22 0611 12/14/22 0601  BILITOT 0.4 1.8* 1.5* 1.3*  AST 38 43* 67* 46*  ALT 21 18 26  36  ALKPHOS 231* 208* 188* 194*  PROT 5.8* 6.4* 5.2* 4.9*  ALBUMIN 2.7* 3.1* 2.4* 2.1*    Assessment and Plan:  Drain Location: RUQ Size: Fr size: 10 Fr Date of placement: 11/17  Currently to: Drain collection device: gravity 24 hour output:  Output by Drain (mL) 12/12/22 0701 - 12/12/22 1900 12/12/22 1901 - 12/13/22 0700 12/13/22 0701 - 12/13/22 1900 12/13/22 1901 - 12/14/22 0700 12/14/22 0701 - 12/14/22 1105  Biliary Tube Cook slip-coat 10 Fr. RUQ   260 200     Interval imaging/drain manipulation:  none  Current examination: Flushes/aspirates easily.  Insertion site unremarkable. Suture and stat lock in place. Dressed appropriately.   Plan: Continue TID flushes with 5 cc NS. Record output Q shift. Dressing changes QD or PRN if soiled.  Call IR APP or on call IR MD if difficulty flushing or sudden change in drain output.  Repeat imaging/possible drain injection once output < 10 mL/QD (excluding flush material). Consideration for drain removal if output is < 10 mL/QD (excluding flush material), pending discussion with the providing surgical service.  Discharge planning: Please contact IR APP or on call IR MD prior to patient d/c to ensure appropriate follow up plans are  in place. Typically patient will follow up with IR clinic 6-8 weeks post d/c for repeat imaging/possible drain injection. IR scheduler will contact patient with date/time of appointment. Patient will need to flush drain QD with 5 cc NS, record output QD, dressing changes every 2-3 days or earlier if soiled.   IR will continue to follow - please call with questions or  concerns.     Electronically Signed: Sable Feil, PA-C 12/14/2022, 10:45 AM   I spent a total of 15 Minutes at the the patient's bedside AND on the patient's hospital floor or unit, greater than 50% of which was counseling/coordinating care for percutaneous cholecystotomy drain placement.

## 2022-12-14 NOTE — Progress Notes (Signed)
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   Medical History: Past Medical History:  Diagnosis Date   Alcohol abuse    Anxiety    Chickenpox    Depression    Neuromuscular disorder (HCC)    neuropathy   Substance abuse (HCC)     Medications:  Medications Prior to Admission  Medication Sig Dispense Refill Last Dose   gabapentin (NEURONTIN) 300 MG capsule Take 2 capsules (600 mg total) by mouth 2 (two) times daily. (Patient taking differently: Take 900 mg by mouth at bedtime.) 60 capsule 0 12/07/2022   mirtazapine (REMERON) 45 MG tablet Take 45 mg by mouth at bedtime.   Past Week   QUEtiapine (SEROQUEL) 100 MG tablet Take 100-200 mg by mouth at bedtime.   12/07/2022   albuterol (VENTOLIN HFA) 108 (90 Base) MCG/ACT inhaler Inhale 2 puffs into the lungs every 6 (six) hours as needed for wheezing or shortness of breath. (Patient not taking: Reported on 12/08/2022) 8.5 g 1 Not Taking   folic acid (FOLVITE) 1 MG tablet Take 1 tablet (1 mg total) by mouth daily. (Patient not taking: Reported on 12/08/2022) 30 tablet 1 Not Taking   mirtazapine (REMERON) 15 MG tablet Take 1 tablet (15 mg total) by mouth at bedtime. (Patient not taking: Reported on 12/08/2022) 30 tablet 1 Not Taking    mometasone-formoterol (DULERA) 200-5 MCG/ACT AERO Inhale 2 puffs into the lungs 2 (two) times daily. (Patient not taking: Reported on 12/08/2022) 13 g 1 Not Taking   QUEtiapine (SEROQUEL) 25 MG tablet Take 1 tablet (25 mg total) by mouth at bedtime. (Patient not taking: Reported on 12/08/2022) 30 tablet 1 Not Taking   thiamine 100 MG tablet Take 1 tablet (100 mg total) by mouth daily. (Patient not taking: Reported on 09/12/2021) 30 tablet 1    Scheduled:   feeding supplement  1 Container Oral TID BM   gabapentin  900 mg Oral QHS   lidocaine  20 mL Intradermal Once   mirtazapine  45 mg Oral QHS   pantoprazole (PROTONIX) IV  40 mg Intravenous Q12H   QUEtiapine  100-200 mg Oral QHS   sucralfate  1 g Oral TID WC & HS   PRN: acetaminophen **OR** acetaminophen, alum & mag hydroxide-simeth, bisacodyl, HYDROmorphone (DILAUDID) injection, ipratropium-albuterol, ondansetron **OR** ondansetron (ZOFRAN) IV, oxyCODONE  Assessment: Kathryn Underwood admitted 11/12 for acute on chronic pancreatitis symptoms. Found to have a pancreatic mass concerning for malignancy. CT also reveals splenic, SMV, and portal vein thrombus. GI uncertain if this is true thrombus vs tumor compression, but for now would like Pharmacy to start IV heparin, until HemOnc can see on Monday. 11/15 heparin protocol per pharmacy ordered then cancelled for percutaneous biliary drain placement 11/18 pharmacy  re-consulted to dose heparin   11/17 Baseline INR, mildly elevated; consistent with acute hepatic congestion from above processes, vitamin K 10 mg IV given 11/17 @ 0931 am  Prior anticoagulation: none PTA; on Lovenox 40 mg/d; last dose given 11/16 PM  Significant events:  11/17 percutaneous cholecystostomy tube placement.   Today, 12/14/2022: CBC: WNL  SCr stable < 1.0 No bleeding per nursing To start heparin today  Goal of Therapy: Heparin level 0.3-0.7 units/ml Monitor platelets by anticoagulation protocol: Yes  Plan: No heparin  bolus Start Heparin  @ 850 units/hr IV infusion Check heparin level 8 hrs after start Daily CBC, daily heparin level once stable Monitor for signs of bleeding or thrombosis   [name], Pharm.D Use secure chat for questions 12/14/2022 10:50 AM

## 2022-12-14 NOTE — Progress Notes (Signed)
PROGRESS NOTE  Kathryn Underwood WGN:[id number] DOB: 13-Mar-1960   PCP: Bary Leriche, PA-C  Patient is from: Home.  DOA: 12/08/2022 LOS: 6  Chief complaints Chief Complaint  Patient presents with   Weight Loss   Weakness     Brief Narrative / Interim history: 62 year old F with PMH of EtOH pancreatitis with pseudocyst, alcohol use disorder, neuropathy, anxiety, depression, GERD and tobacco use disorder presenting with decreased oral intake, fatigue and unintentional weight loss for about 2 months and found to have pancreatic mass on imaging.  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  EUS not successful on 11/14 due to foodstuff in stomach.  Patient underwent EGD/EUS on 11/16.  After procedure, she has significant abdominal pain for which she received IV fentanyl twice while in PACU.  Patient continued to have significant right-sided abdominal pain despite pain medications.  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  IR consulted and patient underwent percutaneous cholecystostomy on 11/17.  GI following.  Subjective: Seen and examined earlier this morning.  No major events overnight of this morning.  Reports passing a lot of flatus last night.  Reports improvement in her pain as well.  Tolerating clear liquid diet.  Denies nausea or vomiting.  Hoping to get full liquid diet.  Objective: Vitals:   12/14/22 0007 12/14/22 0455 12/14/22 0755 12/14/22 1446  BP: 104/60 104/64 (!) 90/56 (!) 81/71  Pulse: (!) 109 (!) 105 (!) 56 82  Resp: 14 16 15 15   Temp: 98.2 F (36.8 C) 98.2 F (36.8 C) 97.7 F (36.5 C) 98.6 F (37 C)  TempSrc: Oral Oral Oral Oral   SpO2: 96% 95% 96% 98%  Weight:      Height:        Examination:  GENERAL: No apparent distress.  Nontoxic. HEENT: MMM.  Vision and hearing grossly intact.  NECK: Supple.  No apparent JVD.  RESP:  No IWOB.  Fair aeration bilaterally. CVS:  RRR. Heart sounds normal.  ABD/GI/GU: BS+. Abd slightly distended.  Mild RUQ tenderness.  Drain in place. MSK/EXT:  Moves extremities. No apparent deformity. No edema.  SKIN: no apparent skin lesion or wound NEURO: Awake, alert and oriented appropriately.  No apparent focal neuro deficit. PSYCH: Calm.  No apparent distress.  Procedures:  11/14-EUS not successful due to food stuff in his stomach. 11/16-EGD/EUS.  Pathology pending. 11/17-percutaneous cholecystostomy tube  Microbiology summarized: None  Assessment and plan: New pancreatic head mass/possible GOO/gallbladder distention: Patient presented with unintentional weight loss, poor p.o. intake and fatigue.  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  CA 19-9 normal at 13.  Patient underwent EGD/EUS on 11/16.  Significant abdominal pain after procedure.  Repeat CT on 11/16 with concern for possible GOO, GB distention, biliary ductal dilation and venous thrombosis.  Cholecystostomy drain placed by IR on 11/17.  Pain, LFT and bilirubin improved. -Appreciate guidance by GI. -Coley drain management per IR -Continue IV ceftriaxone and Flagyl -Advanced to full liquid diet by GI -Monitor CMP and CBC. -Discontinue IV fluid -Follow pathology from EUS.   Portal vein/superior mesenteric/splenic vein thrombosis: This unclear if this is blood clot or infiltrating malignancy. -Started IV heparin after discussion with  GI   Hx alcoholic pancreatitis with pseudocyst: CT abdomen and pelvis as above.  Patient without significant abdominal pain, nausea or vomiting.  No significant elevation of lipase or LFT.   CT and MRI as above.  Elevated AST/ALP/hyperbilirubinemia: Mild.  Improved after cholecystostomy tube. -Continue monitoring  Hypokalemia/hypomagnesemia: -Monitor replenish as appropriate  Metabolic alkalosis: Likely from GI loss.  Resolved.   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 -Counseled on smoking cessation -Not interested in nicotine patch   Emphysema: No respiratory symptoms -Encouraged smoking cessation -As needed DuoNebs  Severe malnutrition.  Likely due to #1 Body mass index is 19.49 kg/m. Nutrition Problem: Severe Malnutrition Etiology: chronic illness (pancreatitis) Signs/Symptoms: energy intake < or equal to 75% for > or equal to 1 month, severe fat depletion, moderate muscle depletion, percent weight loss Interventions: Boost Breeze, MVI, Education   DVT prophylaxis:  On full dose anticoagulation  Code Status: Full code Family Communication: None at bedside Level of care: Telemetry Status is: Inpatient Remains inpatient appropriate because: Pancreatic mass, GOO, GB distention and venous thrombosis   Final disposition: Likely home Consultants:  Gastroenterology Oncology Interventional radiology  55 minutes with more than 50% spent in reviewing records, counseling patient/family and coordinating care.   Sch Meds:  Scheduled Meds:  feeding supplement  1 Container Oral TID BM   gabapentin  900 mg Oral QHS   lidocaine  20 mL Intradermal Once   mirtazapine  45 mg Oral QHS   pantoprazole (PROTONIX) IV  40 mg Intravenous Q12H   QUEtiapine  100-200 mg Oral QHS   sucralfate  1 g Oral TID WC & HS   Continuous Infusions:  cefTRIAXone (ROCEPHIN)  IV Stopped (12/13/22 1939)   heparin 850 Units/hr (12/14/22 1209)   metronidazole 100 mL/hr at 12/14/22 0537    PRN Meds:.acetaminophen **OR**  acetaminophen, alum & mag hydroxide-simeth, bisacodyl, HYDROmorphone (DILAUDID) injection, ipratropium-albuterol, ondansetron **OR** ondansetron (ZOFRAN) IV, oxyCODONE  Antimicrobials: Anti-infectives (From admission, onward)    Start     Dose/Rate Route Frequency Ordered Stop   12/12/22 2000  ciprofloxacin (CIPRO) tablet 500 mg  Status:  Discontinued        500 mg Oral 2 times daily 12/12/22 1006 12/12/22 1809   12/12/22 1830  cefTRIAXone (ROCEPHIN) 2 g in sodium chloride 0.9 % 100 mL IVPB        2 g 200 mL/hr over 30 Minutes Intravenous Every 24 hours 12/12/22 1809 12/17/22 1829   12/12/22 1830  metroNIDAZOLE (FLAGYL) IVPB 500 mg        500 mg 100 mL/hr over 60 Minutes Intravenous Every 12 hours 12/12/22 1809 12/17/22 1829        I have personally reviewed the following labs and images: CBC: Recent Labs  Lab 12/10/22 0544 12/11/22 0552 12/12/22 1851 12/13/22 0611 12/14/22 0601  WBC 6.8 7.6 24.9* 14.9* 10.1  HGB 12.6 13.5 15.5* 14.0 12.1  HCT 39.1 40.2 47.5* 43.0 36.4  MCV 102.9* 100.8* 101.7* 102.4* 102.0*  PLT 355 397 604* 469* 352   BMP &GFR Recent Labs  Lab 12/09/22 1745 12/10/22 0544 12/11/22 0552 12/12/22 1054 12/12/22 1851 12/13/22 0611 12/14/22 0601  NA 138 135 136 133* 132* 133* 131*  K 3.1* 3.2* 4.2 4.4 4.6 5.0 4.3  CL 96* 102 102 100 96* 105 103  CO2 33* 26 27 25 28 23 22   GLUCOSE  114* 103* 119* 100* 139* 144* 104*  BUN 9 5* 6* 11 15 17 15   CREATININE 0.47 0.40* 0.43* 0.60 0.61 0.76 0.58  CALCIUM 8.3* 7.8* 8.6* 8.2* 8.2* 7.7* 8.2*  MG 2.1 2.2 2.3  --   --  3.3* 2.5*  PHOS  --   --   --   --   --   --  1.6*   Estimated Creatinine Clearance: 56.7 mL/min (by C-G formula based on SCr of 0.58 mg/dL). Liver & Pancreas: Recent Labs  Lab 12/10/22 0544 12/11/22 0552 12/12/22 1851 12/13/22 0611 12/14/22 0601  AST 19 38 43* 67* 46*  ALT 12 21 18 26  36  ALKPHOS 77 231* 208* 188* 194*  BILITOT 0.5 0.4 1.8* 1.5* 1.3*  PROT 5.0* 5.8* 6.4* 5.2* 4.9*   ALBUMIN 2.4* 2.7* 3.1* 2.4* 2.1*   Recent Labs  Lab 12/08/22 1231 12/11/22 1940 12/12/22 1851 12/13/22 0611  LIPASE 57* 36 31 29  AMYLASE 42  --   --   --    No results for input(s): "AMMONIA" in the last 168 hours. Diabetic: No results for input(s): "HGBA1C" in the last 72 hours. Recent Labs  Lab 12/13/22 1213 12/13/22 1748 12/14/22 0006 12/14/22 0626 12/14/22 1154  GLUCAP 183* 137* 133* 104* 91   Cardiac Enzymes: No results for input(s): "CKTOTAL", "CKMB", "CKMBINDEX", "TROPONINI" in the last 168 hours. No results for input(s): "PROBNP" in the last 8760 hours. Coagulation Profile: Recent Labs  Lab 12/13/22 0611  INR 1.3*   Thyroid Function Tests: No results for input(s): "TSH", "T4TOTAL", "FREET4", "T3FREE", "THYROIDAB" in the last 72 hours. Lipid Profile: No results for input(s): "CHOL", "HDL", "LDLCALC", "TRIG", "CHOLHDL", "LDLDIRECT" in the last 72 hours. Anemia Panel: No results for input(s): "VITAMINB12", "FOLATE", "FERRITIN", "TIBC", "IRON", "RETICCTPCT" in the last 72 hours. Urine analysis:    Component Value Date/Time   COLORURINE YELLOW 07/24/2021 0827   APPEARANCEUR HAZY (A) 07/24/2021 0827   LABSPEC 1.019 07/24/2021 0827   PHURINE 5.0 07/24/2021 0827   GLUCOSEU NEGATIVE 07/24/2021 0827   HGBUR NEGATIVE 07/24/2021 0827   BILIRUBINUR NEGATIVE 07/24/2021 0827   KETONESUR NEGATIVE 07/24/2021 0827   PROTEINUR TRACE (A) 07/24/2021 0827   UROBILINOGEN 0.2 01/23/2010 1750   NITRITE NEGATIVE 07/24/2021 0827   LEUKOCYTESUR NEGATIVE 07/24/2021 0827   Sepsis Labs: Invalid input(s): "PROCALCITONIN", "LACTICIDVEN"  Microbiology: Recent Results (from the past 240 hour(s))  Aerobic/Anaerobic Culture w Gram Stain (surgical/deep wound)     Status: None (Preliminary result)   Collection Time: 12/13/22  5:00 PM   Specimen: BILE  Result Value Ref Range Status   Specimen Description   Final    BILE Performed at [facility], [address], [place], [place] [address]    Special Requests   Final    NONE Performed at [facility], [address] [place], [place] [address]    Gram Stain NO WBC SEEN NO ORGANISMS SEEN   Final   Culture   Final    NO GROWTH < 24 HOURS Performed at [facility], [address], [place], [place] [address]    Report Status PENDING  Incomplete    Radiology Studies: IR Perc Cholecystostomy  Addendum Date: 12/13/2022   ADDENDUM REPORT: 12/13/2022 20:18 ADDENDUM: Contrast: 5 mL Omnipaque 300 Electronically Signed   By: Richarda Overlie M.D.   On: 12/13/2022 20:18   Result Date: 12/13/2022 INDICATION: 62 year old with abdominal pain and severe gallbladder distension. New fluid around the gallbladder  after upper endoscopy and EUS. Concern for a pancreatic malignancy. EXAM: IMAGE GUIDED PERCUTANEOUS CHOLECYSTOSTOMY TUBE PLACEMENT MEDICATIONS: Inpatient and receiving IV antibiotics. ANESTHESIA/SEDATION: Moderate (conscious) sedation was employed during this procedure. A total of Versed 1.5 mg and Fentanyl 50 mcg was administered intravenously by the radiology nurse. Total intra-service moderate Sedation Time: 15 minutes. The patient's level of consciousness and vital signs were monitored continuously by radiology nursing throughout the procedure under my direct supervision. FLUOROSCOPY: Radiation Exposure Index (as provided by the fluoroscopic device): 0.1 mGy Kerma COMPLICATIONS: None immediate. PROCEDURE: Informed written consent was obtained from the patient after a thorough discussion of the procedural risks, benefits and alternatives. All questions were addressed. A timeout was performed prior to the initiation of the procedure. Patient was placed supine. The right side of the abdomen was prepped and draped in sterile fashion. Maximal barrier sterile technique was utilized including caps, mask, sterile gowns, sterile gloves, sterile drape, hand hygiene and skin antiseptic. Ultrasound  demonstrated a very distended gallbladder. The skin was anesthetized using 1% lidocaine. Using ultrasound guidance, 21 gauge needle was directed into the gallbladder and a 0.018 wire was placed. A transitional dilator set was placed. Wire was advanced into the gallbladder and the tract was dilated to accommodate a 10 [place] multipurpose drain. Approximately 200 mL of dark green fluid was removed. The gallbladder was decompressed at the end of the procedure. Contrast injection confirmed placement in the gallbladder. Drain was sutured to skin and attached to a gravity bag. Fluid was sent for culture and cytology. Fluoroscopic and ultrasound images were taken and saved for documentation. FINDINGS: Gallbladder was severely distended. Fluid and edematous tissue around the gallbladder. Gallbladder was decompressed at the end of the procedure. 200 mL of fluid was removed. IMPRESSION: Successful image guided placement of a percutaneous cholecystostomy tube. Electronically Signed: By: Richarda Overlie M.D. On: 12/13/2022 19:47      Aiana Nordquist T. Kayln Garceau [facility]  If 7PM-7AM, please contact night-coverage [email or web address] 12/14/2022, 4:15 PM

## 2022-12-14 NOTE — Progress Notes (Signed)
Pharmacy: Re- heparin   Patient is a 62 y.o F with hx EtOH pancreatitis who presented to the ED on 12/08/22 with c/o generalized weakness and weight loss. She was found to have new pancreatic mass and acute on chronic pancreatitis.  EGD on 12/10/22 to evaluate dysphagia was noted to have LA Grade D erosive esophagitis with bleeding and esophageal stenosis. She also underwent upper EUS on 12/12/22 with biopsy of pancreas lesion.  Abdominal CT on 12/12/22 found, "Stable infiltrating pancreatic mass, with thrombosis of the splenic vein, SMV, and proximal portal vein. Nonocclusive thrombus within the main portal vein just proximal to the bifurcation." She underwent perc chole tube placement on 12/13/22 with heparin drip for VTE treatment started post procedure on 12/14/22 at ~11a.   - heparin level collected at 7p is sub-therapeutic at 0.19 - per pt's RN, no issue with IV line and no bleeding noted   Goal of Therapy: Heparin level 0.3-0.7 units/ml Monitor platelets by anticoagulation protocol: Yes  Plan: - increase heparin drip to 950 units/hr - check 6 hr heparin level - monitor for s/sx bleeding  Dorna Leitz, PharmD, BCPS 12/14/2022 9:04 PM

## 2022-12-15 ENCOUNTER — Encounter: Payer: Self-pay | Admitting: Gastroenterology

## 2022-12-15 DIAGNOSIS — K831 Obstruction of bile duct: Secondary | ICD-10-CM

## 2022-12-15 DIAGNOSIS — K311 Adult hypertrophic pyloric stenosis: Secondary | ICD-10-CM

## 2022-12-15 DIAGNOSIS — K8689 Other specified diseases of pancreas: Secondary | ICD-10-CM | POA: Diagnosis not present

## 2022-12-15 LAB — CBC
HCT: 33.3 % — ABNORMAL LOW (ref 36.0–46.0)
Hemoglobin: 11.3 g/dL — ABNORMAL LOW (ref 12.0–15.0)
MCH: 33.8 pg (ref 26.0–34.0)
MCHC: 33.9 g/dL (ref 30.0–36.0)
MCV: 99.7 fL (ref 80.0–100.0)
Platelets: 348 10*3/uL (ref 150–400)
RBC: 3.34 MIL/uL — ABNORMAL LOW (ref 3.87–5.11)
RDW: 13.2 % (ref 11.5–15.5)
WBC: 8 10*3/uL (ref 4.0–10.5)
nRBC: 0 % (ref 0.0–0.2)

## 2022-12-15 LAB — GLUCOSE, CAPILLARY
Glucose-Capillary: 101 mg/dL — ABNORMAL HIGH (ref 70–99)
Glucose-Capillary: 119 mg/dL — ABNORMAL HIGH (ref 70–99)
Glucose-Capillary: 141 mg/dL — ABNORMAL HIGH (ref 70–99)
Glucose-Capillary: 88 mg/dL (ref 70–99)

## 2022-12-15 LAB — COMPREHENSIVE METABOLIC PANEL
ALT: 24 U/L (ref 0–44)
AST: 18 U/L (ref 15–41)
Albumin: 2 g/dL — ABNORMAL LOW (ref 3.5–5.0)
Alkaline Phosphatase: 183 U/L — ABNORMAL HIGH (ref 38–126)
Anion gap: 5 (ref 5–15)
BUN: 11 mg/dL (ref 8–23)
CO2: 24 mmol/L (ref 22–32)
Calcium: 7.8 mg/dL — ABNORMAL LOW (ref 8.9–10.3)
Chloride: 102 mmol/L (ref 98–111)
Creatinine, Ser: 0.5 mg/dL (ref 0.44–1.00)
GFR, Estimated: >60 mL/min (ref >60)
Glucose, Bld: 104 mg/dL — ABNORMAL HIGH (ref 70–99)
Potassium: 3.5 mmol/L (ref 3.5–5.1)
Sodium: 131 mmol/L — ABNORMAL LOW (ref 135–145)
Total Bilirubin: 1 mg/dL (ref <1.2)
Total Protein: 4.8 g/dL — ABNORMAL LOW (ref 6.5–8.1)

## 2022-12-15 LAB — HEPARIN LEVEL (UNFRACTIONATED)
Heparin Unfractionated: 0.37 [IU]/mL (ref 0.30–0.70)
Heparin Unfractionated: 0.27 [IU]/mL — ABNORMAL LOW (ref 0.30–0.70)
Heparin Unfractionated: 0.32 [IU]/mL (ref 0.30–0.70)

## 2022-12-15 LAB — SURGICAL PATHOLOGY

## 2022-12-15 LAB — MAGNESIUM: Magnesium: 2.2 mg/dL (ref 1.7–2.4)

## 2022-12-15 MED ORDER — HEPARIN (PORCINE) 25000 UT/250ML-% IV SOLN
1150.0000 [IU]/h | INTRAVENOUS | Status: DC
  Administered 2022-12-15 – 2022-12-16 (×2): 1050 [IU]/h via INTRAVENOUS
  Administered 2022-12-17: 1150 [IU]/h via INTRAVENOUS
  Filled 2022-12-15 (×3): qty 250

## 2022-12-15 NOTE — Progress Notes (Signed)
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   No Known Allergies  Patient Measurements: Height: 5\' 3"  (160 cm) Weight: 49.9 kg (110 lb) IBW/kg (Calculated) : 52.4 Heparin Dosing Weight: TBW  Vital Signs: Temp: 98.4 F (36.9 C) (11/19 0610) Temp Source: Oral (11/19 0610) BP: 101/68 (11/19 0610) Pulse Rate: 100 (11/19 0610)  Labs: Recent Labs    12/13/22 0611 12/14/22 0601 12/14/22 1939 12/15/22 0556  HGB 14.0 12.1  --  11.3*  HCT 43.0 36.4  --  33.3*  PLT 469* 352  --  348  LABPROT 16.5*  --   --   --   INR 1.3*  --   --   --   HEPARINUNFRC  --   --  0.19* 0.37  CREATININE 0.76 0.58  --  0.50    Estimated Creatinine Clearance: 56.7 mL/min (by C-G formula based on SCr of 0.5 mg/dL).  Assessment: Kathryn Underwood admitted 11/12 for acute on chronic pancreatitis symptoms. Found to have a pancreatic mass concerning for malignancy. CT also reveals splenic, SMV, and portal vein thrombus. GI uncertain if this is true thrombus vs tumor compression, but for now would like Pharmacy to start IV heparin, until HemOnc can see on Monday. 11/15 heparin protocol per pharmacy ordered then cancelled for percutaneous biliary drain placement 11/18 pharmacy re-consulted to dose heparin   11/17 Baseline INR, mildly elevated; consistent with acute hepatic congestion from above processes, vitamin K 10 mg IV given 11/17 @ 0931 am  Prior anticoagulation: none PTA; on Lovenox 40 mg/d; last dose given 11/16 PM  Significant events:  11/17 percutaneous cholecystostomy tube placement.   Today, 12/15/2022: Heparin level 0.37 is therapeutic after rate increase to 950 units/hr CBC: Hg 11.3- down slightly, PLT WNL SCr stable < 1.0 No bleeding per nursing  Goal of Therapy: Heparin level 0.3-0.7 units/ml Monitor platelets by anticoagulation protocol: Yes  Plan: continue Heparin @ 950 units/hr  IV infusion and check confirmatory level in 6 hours Daily CBC, daily heparin level Monitor for signs of bleeding or thrombosis  [name], Pharm.D Use secure chat for questions 12/15/2022 7:21 AM    [name], Pharm.D Use secure chat for questions 12/15/2022 7:18 AM

## 2022-12-15 NOTE — Progress Notes (Signed)
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   No Known Allergies  Patient Measurements: Height: 5\' 3"  (160 cm) Weight: 49.9 kg (110 lb) IBW/kg (Calculated) : 52.4 Heparin Dosing Weight:   Vital Signs: Temp: 98.7 F (37.1 C) (11/19 2018) Temp Source: Oral (11/19 2018) BP: 127/88 (11/19 2018) Pulse Rate: 93 (11/19 2018)  Labs: Recent Labs    12/13/22 0611 12/14/22 0601 12/14/22 1939 12/15/22 0556 12/15/22 1144 12/15/22 2056  HGB 14.0 12.1  --  11.3*  --   --   HCT 43.0 36.4  --  33.3*  --   --   PLT 469* 352  --  348  --   --   LABPROT 16.5*  --   --   --   --   --   INR 1.3*  --   --   --   --   --   HEPARINUNFRC  --   --    < > 0.37 0.27* 0.32  CREATININE 0.76 0.58  --  0.50  --   --    < > = values in this interval not displayed.    Estimated Creatinine Clearance: 56.7 mL/min (by C-G formula based on SCr of 0.5 mg/dL).   Medical History: Past Medical History:  Diagnosis Date   Alcohol abuse    Anxiety    Chickenpox    Depression    Neuromuscular disorder (HCC)    neuropathy   Substance abuse (HCC)    Assessment: AC/Heme: enox 40 >> UFH infusion for splenic/SMV/portal thrombus (likely tumor thrombus; GI seeing HemOnc input on Monday) NOT started "This unclear if this is blood clot or infiltrating malignancy" 11/12- 11/16: LMWH 40mg  11/17 GI ordered 10 mg Vit K IV 11/18 start UFH>>>  - Hep level 0.27>>0.32 now in goal range.  Goal of Therapy:  Heparin level 0.3-0.7 units/ml Monitor platelets by anticoagulation protocol: Yes   Plan: Con't Heparin 1050 units/hr Daily HL and CBC   Kathryn Underwood S. Merilynn Finland, PharmD, BCPS Clinical Staff Pharmacist [email or web address] Merilynn Finland, Anup Brigham Stillinger 12/15/2022,10:02 PM

## 2022-12-15 NOTE — Progress Notes (Signed)
Referring Physician(s): Dr. Morton Peters   Supervising Physician: Marliss Coots  Patient Status:  [facility] - In-pt  Chief Complaint:  Abdominal pain with severe gallbladder distention   Subjective:  Patient is alert and laying in bed this morning.  Patient states she feels greatly improved, and has not required pain medication today as of time of rounds. However, she drain sites itches her a lot. Drain with good output overnight.  Allergies: Patient has no known allergies.  Medications: Prior to Admission medications   Medication Sig Start Date End Date Taking? Authorizing Provider  gabapentin (NEURONTIN) 300 MG capsule Take 2 capsules (600 mg total) by mouth 2 (two) times daily. Patient taking differently: Take 900 mg by mouth at bedtime. 10/09/17  Yes Rolan Bucco, MD  mirtazapine (REMERON) 45 MG tablet Take 45 mg by mouth at bedtime. 11/05/21  Yes [provider]  QUEtiapine (SEROQUEL) 100 MG tablet Take 100-200 mg by mouth at bedtime.   Yes [provider]  albuterol (VENTOLIN HFA) 108 (90 Base) MCG/ACT inhaler Inhale 2 puffs into the lungs every 6 (six) hours as needed for wheezing or shortness of breath. Patient not taking: Reported on 12/08/2022 08/06/21   Burnadette Pop, MD  folic acid (FOLVITE) 1 MG tablet Take 1 tablet (1 mg total) by mouth daily. Patient not taking: Reported on 12/08/2022 08/07/21   Burnadette Pop, MD  mirtazapine (REMERON) 15 MG tablet Take 1 tablet (15 mg total) by mouth at bedtime. Patient not taking: Reported on 12/08/2022 08/06/21   Burnadette Pop, MD  mometasone-formoterol (DULERA) 200-5 MCG/ACT AERO Inhale 2 puffs into the lungs 2 (two) times daily. Patient not taking: Reported on 12/08/2022 08/06/21   Burnadette Pop, MD  QUEtiapine (SEROQUEL) 25 MG tablet Take 1 tablet (25 mg total) by mouth at bedtime. Patient not taking: Reported on 12/08/2022 08/06/21   Burnadette Pop, MD  thiamine 100 MG tablet Take 1 tablet (100 mg  total) by mouth daily. Patient not taking: Reported on 09/12/2021 08/07/21   Burnadette Pop, MD     Vital Signs: BP 101/68 (BP Location: Left Arm)   Pulse 100   Temp 98.4 F (36.9 C) (Oral)   Resp 18   Ht 5\' 3"  (1.6 m)   Wt 110 lb (49.9 kg)   SpO2 95%   BMI 19.49 kg/m   Physical Exam Abdominal:     Tenderness: There is no abdominal tenderness.     Comments:  Drain site c/d/I. Minimal tenderness to palpation at drain site. Draining approx. 50cc bilious OP in bag. Flushes/aspirates easily.     Imaging: IR Perc Cholecystostomy  Addendum Date: 12/13/2022   ADDENDUM REPORT: 12/13/2022 20:18 ADDENDUM: Contrast: 5 mL Omnipaque 300 Electronically Signed   By: Richarda Overlie M.D.   On: 12/13/2022 20:18   Result Date: 12/13/2022 INDICATION: 62 year old with abdominal pain and severe gallbladder distension. New fluid around the gallbladder after upper endoscopy and EUS. Concern for a pancreatic malignancy. EXAM: IMAGE GUIDED PERCUTANEOUS CHOLECYSTOSTOMY TUBE PLACEMENT MEDICATIONS: Inpatient and receiving IV antibiotics. ANESTHESIA/SEDATION: Moderate (conscious) sedation was employed during this procedure. A total of Versed 1.5 mg and Fentanyl 50 mcg was administered intravenously by the radiology nurse. Total intra-service moderate Sedation Time: 15 minutes. The patient's level of consciousness and vital signs were monitored continuously by radiology nursing throughout the procedure under my direct supervision. FLUOROSCOPY: Radiation Exposure Index (as provided by the fluoroscopic device): 0.1 mGy Kerma COMPLICATIONS: None immediate. PROCEDURE: Informed written consent was obtained from the patient after  a thorough discussion of the procedural risks, benefits and alternatives. All questions were addressed. A timeout was performed prior to the initiation of the procedure. Patient was placed supine. The right side of the abdomen was prepped and draped in sterile fashion. Maximal barrier sterile  technique was utilized including caps, mask, sterile gowns, sterile gloves, sterile drape, hand hygiene and skin antiseptic. Ultrasound demonstrated a very distended gallbladder. The skin was anesthetized using 1% lidocaine. Using ultrasound guidance, 21 gauge needle was directed into the gallbladder and a 0.018 wire was placed. A transitional dilator set was placed. Wire was advanced into the gallbladder and the tract was dilated to accommodate a 10 [place] multipurpose drain. Approximately 200 mL of dark green fluid was removed. The gallbladder was decompressed at the end of the procedure. Contrast injection confirmed placement in the gallbladder. Drain was sutured to skin and attached to a gravity bag. Fluid was sent for culture and cytology. Fluoroscopic and ultrasound images were taken and saved for documentation. FINDINGS: Gallbladder was severely distended. Fluid and edematous tissue around the gallbladder. Gallbladder was decompressed at the end of the procedure. 200 mL of fluid was removed. IMPRESSION: Successful image guided placement of a percutaneous cholecystostomy tube. Electronically Signed: By: Richarda Overlie M.D. On: 12/13/2022 19:47   CT ABDOMEN PELVIS W CONTRAST  Result Date: 12/12/2022 CLINICAL DATA:  Acute abdominal pain,  pancreatitis, pancreatic pass EXAM: CT ABDOMEN AND PELVIS WITH CONTRAST TECHNIQUE: Multidetector CT imaging of the abdomen and pelvis was performed using the standard protocol following bolus administration of intravenous contrast. RADIATION DOSE REDUCTION: This exam was performed according to the departmental dose-optimization program which includes automated exposure control, adjustment of the mA and/or kV according to patient size and/or use of iterative reconstruction technique. CONTRAST:  OMNIPAQUE IOHEXOL 300 MG/ML  SOLN COMPARISON:  12/09/2022, 12/08/2022 FINDINGS: Lower chest: No acute pleural or parenchymal lung disease. Stable pericardial effusion. Stable  distal esophageal wall thickening. Small hiatal hernia. Hepatobiliary: Marked distension of the gallbladder is again noted, with intrahepatic duct dilation and proximal common bile duct distension, due to occlusion by the infiltrative pancreatic mass image previously. Heterogeneous appearance of the liver parenchyma within the inferior right lobe and extending along the gallbladder fossa could likely due to known portal vein thrombosis or geographic hepatic steatosis as seen on recent MRI. Multiple liver hypodensities are unchanged, previously characterized on MRI. Pancreas: The infiltrative mass centered at the pancreatic head is again noted, measuring up to 6.1 x 4.4 cm, compatible with pancreatic neoplasm based on MRI findings. Upstream pancreatic parenchymal atrophy. Decreased fluid collections surrounding the pancreas, now measuring 5.6 x 2.5 cm image 36/2 and 2.4 x 2.2 cm reference image 44/2. Spleen: Normal in size without focal abnormality. Adrenals/Urinary Tract: Stable appearance of the adrenals and kidneys. Bladder is decompressed, limiting its evaluation. Stomach/Bowel: Prominent gastric distension again noted, with functional gastric outlet obstruction due to mass effect by the infiltrative pancreatic mass described above. Likely ulceration within the gastric antrum measuring up to 1.8 cm reference image 26/5. No evidence of perforation. No evidence of bowel obstruction or ileus. Vascular/Lymphatic: Continued occlusion of the splenic vein, SMV, and proximal aspect of the portal vein again noted due to extrinsic compression by the infiltrative pancreatic mass. There is nonocclusive thrombus within the main portal vein near the bifurcation. Extensive venous collaterals are seen throughout the mesentery unchanged since prior exam. Stable aortic atherosclerosis. No discrete pathologic adenopathy identified. Numerous subcentimeter mesenteric lymph nodes adjacent to the pancreatic mass in the  upper abdomen  are nonspecific. Reproductive: Calcified uterine fibroids unchanged. No adnexal masses. Other: Free fluid surrounds the distended gallbladder within the right upper quadrant. No free intraperitoneal gas. No abdominal wall hernia. Musculoskeletal: No acute or destructive bony abnormalities. Reconstructed images demonstrate no additional findings. IMPRESSION: 1. Stable infiltrating pancreatic mass, with thrombosis of the splenic vein, SMV, and proximal portal vein. Nonocclusive thrombus within the main portal vein just proximal to the bifurcation. 2. Decreased size of the peripancreatic fluid collections as above. New simple appearing free fluid surrounding a distended gallbladder and right upper quadrant could reflect interval rupture of these peripancreatic fluid collections. 3. Continued gastric distension compatible with functional gastric outlet obstruction due to the infiltrating pancreatic mass. Suspected ulcer within the gastric antrum, without evidence of perforation. 4. Marked gallbladder distension, with intrahepatic and extrahepatic biliary duct dilation, due to downstream common bile duct obstruction due to the pancreatic mass as seen on recent MRI. No evidence of gallbladder wall thickening or calcified gallstones. 5.  Aortic Atherosclerosis (ICD10-I70.0). Electronically Signed   By: Sharlet Salina M.D.   On: 12/12/2022 17:56    Labs:  CBC: Recent Labs    12/12/22 1851 12/13/22 0611 12/14/22 0601 12/15/22 0556  WBC 24.9* 14.9* 10.1 8.0  HGB 15.5* 14.0 12.1 11.3*  HCT 47.5* 43.0 36.4 33.3*  PLT 604* 469* 352 348    COAGS: Recent Labs    12/13/22 0611  INR 1.3*    BMP: Recent Labs    12/12/22 1851 12/13/22 0611 12/14/22 0601 12/15/22 0556  NA 132* 133* 131* 131*  K 4.6 5.0 4.3 3.5  CL 96* 105 103 102  CO2 28 23 22 24   GLUCOSE 139* 144* 104* 104*  BUN 15 17 15 11   CALCIUM 8.2* 7.7* 8.2* 7.8*  CREATININE 0.61 0.76 0.58 0.50  GFRNONAA >60 >60 >60 >60    LIVER FUNCTION  TESTS: Recent Labs    12/12/22 1851 12/13/22 0611 12/14/22 0601 12/15/22 0556  BILITOT 1.8* 1.5* 1.3* 1.0  AST 43* 67* 46* 18  ALT 18 26 36 24  ALKPHOS 208* 188* 194* 183*  PROT 6.4* 5.2* 4.9* 4.8*  ALBUMIN 3.1* 2.4* 2.1* 2.0*    Assessment and Plan:  Drain Location: RUQ Size: Fr size: 10 Fr Date of placement: 11/17  Currently to: Drain collection device: gravity 24 hour output:  Output by Drain (mL) 12/13/22 0701 - 12/12/22 1900 12/13/22 1901 - 12/13/22 0700 12/14/22 0701 - 12/13/22 1900 11/17824 1901 - 12/14/22 0700 12/15/22 0701 - 12/14/22 1105  Biliary Tube Cook slip-coat 10 Fr. RUQ  260  200 200 250        Interval imaging/drain manipulation:  none   Current examination: Flushes/aspirates easily.  Insertion site unremarkable. Suture and stat lock in place. Dressed appropriately.    Plan: Continue TID flushes with 5 cc NS. Record output Q shift. Dressing changes QD or PRN if soiled.  Call IR APP or on call IR MD if difficulty flushing or sudden change in drain output.  Repeat imaging/possible drain injection once output < 10 mL/QD (excluding flush material). Consideration for drain removal if output is < 10 mL/QD (excluding flush material), pending discussion with the providing surgical service.   Discharge planning: Please contact IR APP or on call IR MD prior to patient d/c to ensure appropriate follow up plans are in place. Typically patient will follow up with IR clinic 6-8 weeks post d/c for repeat imaging/possible drain injection. IR scheduler will contact patient with date/time of  appointment. Patient will need to flush drain QD with 5 cc NS, record output QD, dressing changes every 2-3 days or earlier if soiled.    IR will continue to follow - please call with questions or concerns.  Electronically Signed: Sable Feil, PA-C 12/15/2022, 9:03 AM   I spent a total of 15 Minutes at the the patient's bedside AND on the patient's hospital floor or unit,  greater than 50% of which was counseling/coordinating care for percutaneous cholecystotomy drain placement.

## 2022-12-15 NOTE — Progress Notes (Addendum)
Progress Note  Primary GI: Dr. Marina Goodell DOA: 12/08/2022         Hospital Day: 8   Subjective  Chief Complaint: Abdominal pain, weight loss, pancreatic mass  No family was present at the time of my evaluation. Patient sitting in bed, states she is tolerating full liquid diet well.  Denies nausea, vomiting.  Continues to have some abdominal discomfort and distention.  Did have soft brown stool last night.    Objective   Vital signs in last 24 hours: Temp:  [98.4 F (36.9 C)-98.7 F (37.1 C)] 98.4 F (36.9 C) (11/19 0610) Pulse Rate:  [80-100] 100 (11/19 0610) Resp:  [15-18] 18 (11/19 0610) BP: (81-134)/(61-71) 101/68 (11/19 0610) SpO2:  [95 %-98 %] 95 % (11/19 0610) Last BM Date : 12/14/22 Last BM recorded by nurses in past 5 days Stool Type: Type 7 (Liquid consistency with no solid pieces) (12/14/2022  5:54 PM)  General:   female in no acute distress  Heart:  Regular rate and rhythm; no murmurs Pulm: Clear anteriorly; no wheezing Abdomen:  Distended but soft, Active bowel sounds. mild tenderness in the entire abdomen. Without guarding and Without rebound, No organomegaly appreciated.  PERC drain right abdomen with 80 cc dark bile Extremities:  without  edema. Neurologic:  Alert and  oriented x4;  No focal deficits.  Psych:  Cooperative. Normal mood and affect.  Intake/Output from previous day: 11/18 0701 - 11/19 0700 In: 2880 [P.O.:2379; I.V.:165.1; IV Piggyback:335.8] Out: 450 [Drains:450] Intake/Output this shift: No intake/output data recorded.  Studies/Results: IR Perc Cholecystostomy  Addendum Date: 12/13/2022   ADDENDUM REPORT: 12/13/2022 20:18 ADDENDUM: Contrast: 5 mL Omnipaque 300 Electronically Signed   By: Richarda Overlie M.D.   On: 12/13/2022 20:18   Result Date: 12/13/2022 INDICATION: 62 year old with abdominal pain and severe gallbladder distension. New fluid around the gallbladder after upper endoscopy and EUS. Concern for a pancreatic malignancy. EXAM:  IMAGE GUIDED PERCUTANEOUS CHOLECYSTOSTOMY TUBE PLACEMENT MEDICATIONS: Inpatient and receiving IV antibiotics. ANESTHESIA/SEDATION: Moderate (conscious) sedation was employed during this procedure. A total of Versed 1.5 mg and Fentanyl 50 mcg was administered intravenously by the radiology nurse. Total intra-service moderate Sedation Time: 15 minutes. The patient's level of consciousness and vital signs were monitored continuously by radiology nursing throughout the procedure under my direct supervision. FLUOROSCOPY: Radiation Exposure Index (as provided by the fluoroscopic device): 0.1 mGy Kerma COMPLICATIONS: None immediate. PROCEDURE: Informed written consent was obtained from the patient after a thorough discussion of the procedural risks, benefits and alternatives. All questions were addressed. A timeout was performed prior to the initiation of the procedure. Patient was placed supine. The right side of the abdomen was prepped and draped in sterile fashion. Maximal barrier sterile technique was utilized including caps, mask, sterile gowns, sterile gloves, sterile drape, hand hygiene and skin antiseptic. Ultrasound demonstrated a very distended gallbladder. The skin was anesthetized using 1% lidocaine. Using ultrasound guidance, 21 gauge needle was directed into the gallbladder and a 0.018 wire was placed. A transitional dilator set was placed. Wire was advanced into the gallbladder and the tract was dilated to accommodate a 10 [place] multipurpose drain. Approximately 200 mL of dark green fluid was removed. The gallbladder was decompressed at the end of the procedure. Contrast injection confirmed placement in the gallbladder. Drain was sutured to skin and attached to a gravity bag. Fluid was sent for culture and cytology. Fluoroscopic and ultrasound images were taken and saved for documentation. FINDINGS: Gallbladder was severely distended. Fluid and  edematous tissue around the gallbladder. Gallbladder was  decompressed at the end of the procedure. 200 mL of fluid was removed. IMPRESSION: Successful image guided placement of a percutaneous cholecystostomy tube. Electronically Signed: By: Richarda Overlie M.D. On: 12/13/2022 19:47    Lab Results: Recent Labs    12/13/22 0611 12/14/22 0601 12/15/22 0556  WBC 14.9* 10.1 8.0  HGB 14.0 12.1 11.3*  HCT 43.0 36.4 33.3*  PLT 469* 352 348   BMET Recent Labs    12/13/22 0611 12/14/22 0601 12/15/22 0556  NA 133* 131* 131*  K 5.0 4.3 3.5  CL 105 103 102  CO2 23 22 24   GLUCOSE 144* 104* 104*  BUN 17 15 11   CREATININE 0.76 0.58 0.50  CALCIUM 7.7* 8.2* 7.8*   LFT Recent Labs    12/13/22 0611 12/14/22 0601 12/15/22 0556  PROT 5.2*   < > 4.8*  ALBUMIN 2.4*   < > 2.0*  AST 67*   < > 18  ALT 26   < > 24  ALKPHOS 188*   < > 183*  BILITOT 1.5*   < > 1.0  BILIDIR 0.7*  --   --    < > = values in this interval not displayed.   PT/INR Recent Labs    12/13/22 0611  LABPROT 16.5*  INR 1.3*     Scheduled Meds:  feeding supplement  1 Container Oral TID BM   gabapentin  900 mg Oral QHS   mirtazapine  45 mg Oral QHS   pantoprazole (PROTONIX) IV  40 mg Intravenous Q12H   QUEtiapine  100-200 mg Oral QHS   sucralfate  1 g Oral TID WC & HS   Continuous Infusions:  cefTRIAXone (ROCEPHIN)  IV Stopped (12/14/22 1828)   heparin 950 Units/hr (12/15/22 0630)   metronidazole 100 mL/hr at 12/15/22 0630      Patient profile:    62 y.o. female with a past medical history significant for anxiety, depression, GERD, neuropathy, alcoholic pancreatitis, pancreatic pseudocyst and alcohol abuse who presented to the ER due to decreased appetite over the past few months with associated weight loss, concern for pancreatic mass. CT abdomen and pelvis showed expansile soft tissue, fat stranding and cystic change throughout the pancreatic head, encasing multiple adjacent structures including the portal vein and central mesenteric vessels, severe intrahepatic  biliary ductal dilation with a distended gallbladder, portal vein effaced and occluded, multiple peripancreatic fluid collections including a collection overlying the pancreatic neck and body measuring 6.9 x 5.3 cm, discussed highly concerning for pancreatic adenocarcinoma complicated by pancreatitis    Impression/Plan:   Weight loss of 30 lbs over last year with abnormal CT of the pancreas concerning for adenocarcinoma, in setting of previous alcohol induced chronic pancreatitis CA 19-9 311 08/14/2021 EUS with Dr. Christella Hartigan no signs pancreatic mass or chronic pancreatitis 11/13 for abnormal CT of the pancreas new pancreatic mass and acute on chronic pancreatitis with multiple pseudocysts 11/13 MRCP large locally aggressive heterogeneous soft tissue mass with spiculations identified centered of the neck and head of the pancreas associated SMA, SMV and occlusion of SMV and narrowing of SMA worrisome for neoplasm/adenocarcinoma several fluid collections up to 6 cm moderate intrahepatic biliary ductal dilation with focal presumed malignant stricture of distal common bile duct with enhancement and shoulder nodularity along the course of the duct gallbladder dilated with layering sludge and stones 1 enhancing lesion in the dome of the liver segment 8 which is indeterminate 11/16 Redo EUS  foodstuffs  still  limiting examination however progressive distal stomach/proximal duodenal inflammation and congestion area cannot be transversed with linear EUS scope or ERCP scope.  Sampled multiple cysts as well as what seemed to be masslike soft tissue.  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  with elevated liver function  AST 18 (46) (67) ALT 24 (36) (26)    Alkphos 183 (194) (188) TBili 1.0 (1.3) (1.5) 12/13/2022 INR 1.3  12/12/2022 Lactic acid 1.8 MRCP moderate intrahepatic biliary ductal dilation and focal presumed malignant stricture of distal common bile duct with nodularity along the course of the duct gallbladder dilated with sludge and stones 11/16 EUS unable to transverse distal stomach/proximal duodenum secondary to inflammation and congestion 11/17 IR PERC cholecystostomy secondary to bili obstruction 200 mL dark green fluid aspirated -Preliminary culture no WBC no organisms, no growth day 2 -LFTs and bilirubin improved -Pending cytology  Gastric outlet obstruction secondary to pancreatic mass Esophageal biopsy: severe acute non specific esophagitis- pending GMS and CMV stains -Continue Protonix IV 40 mg twice daily -Carafate 1 g 4 times daily -continue full liquid diet -consider antral stenting depending clinical course  Thrombosis of splenic vein, 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  Secondary to  pancreatic mass - RD consult -Count calories, increase protein   Principal Problem:   Pancreatic mass Active Problems:   Alcohol-induced chronic pancreatitis (HCC)   Protein malnutrition (HCC)   Unintentional weight loss   Esophageal dysphagia   Dilation of pancreatic duct   Protein-calorie malnutrition, severe   Acute esophagitis   Abnormal CT scan, gastrointestinal tract    High serum carbohydrate antigen 19-9 (CA19-9)   Pancreatic cyst   LOS: 7 days   Doree Albee  12/15/2022, 10:58 AM   Attending Physician Note   I have taken an interval history, reviewed the chart and examined the patient. I performed a substantive portion of this encounter, including complete performance of at least one of the key components, in conjunction with the APP. I agree with the APP's note, impression and recommendations with my edits. My additional impressions and recommendations are as follows.   Pancreas mass concerning for pancreatic cancer with a CBD stricture, gastric outlet obstruction and SMV, splenic vein and portal vein thrombosis. S/P cholecystostomy tube placement. LFTs improved. EUS cytology still pending. IV heparin. Tolerating full liquids.   Claudette Head, MD [facility] See AMION,  GI, for our on call provider

## 2022-12-15 NOTE — Plan of Care (Signed)
  Problem: Clinical Measurements: Goal: Ability to maintain clinical measurements within normal limits will improve Outcome: Progressing Goal: Will remain free from infection Outcome: Progressing Goal: Diagnostic test results will improve Outcome: Progressing Goal: Respiratory complications will improve Outcome: Progressing Goal: Cardiovascular complication will be avoided Outcome: Progressing   Problem: Activity: Goal: Risk for activity intolerance will decrease Outcome: Progressing   Problem: Nutrition: Goal: Adequate nutrition will be maintained Outcome: Progressing   Problem: Coping: Goal: Level of anxiety will decrease Outcome: Progressing   Problem: Pain Management: Goal: General experience of comfort will improve Outcome: Progressing   Problem: Safety: Goal: Ability to remain free from injury will improve Outcome: Progressing   Problem: Skin Integrity: Goal: Risk for impaired skin integrity will decrease Outcome: Progressing

## 2022-12-15 NOTE — Progress Notes (Signed)
PROGRESS NOTE  Kathryn Underwood ZOX:[id number] DOB: 1961/01/27   PCP: Bary Leriche, PA-C  Patient is from: Home.  DOA: 12/08/2022 LOS: 7  Chief complaints Chief Complaint  Patient presents with   Weight Loss   Weakness     Brief Narrative / Interim history: 62 year old F with PMH of EtOH pancreatitis with pseudocyst, alcohol use disorder, neuropathy, anxiety, depression, GERD and tobacco use disorder presenting with decreased oral intake, fatigue and unintentional weight loss for about 2 months and found to have pancreatic mass on imaging.  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  EUS not successful on 11/14 due to foodstuff in stomach.  Patient underwent EGD/EUS on 11/16.  After procedure, she has significant abdominal pain for which she received IV fentanyl twice while in PACU.  Patient continued to have significant right-sided abdominal pain despite pain medications.  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  IR consulted and patient underwent percutaneous cholecystostomy on 11/17.  GI following.  Subjective: Seen and examined earlier this morning.  No major events overnight of this morning.  No complaints.  Understandably overwhelmed with everything going on.  Objective: Vitals:   12/14/22 0755 12/14/22 1446 12/14/22 2041 12/15/22 0610  BP: (!) 90/56 (!) 81/71 134/61 101/68  Pulse: (!) 56 82 80 100  Resp: 15 15 18 18   Temp: 97.7 F (36.5 C) 98.6 F (37 C) 98.7 F (37.1 C) 98.4 F (36.9 C)  TempSrc: Oral Oral Oral Oral  SpO2: 96% 98% 97% 95%  Weight:      Height:        Examination:  GENERAL: No apparent distress.   Nontoxic. HEENT: MMM.  Vision and hearing grossly intact.  NECK: Supple.  No apparent JVD.  RESP:  No IWOB.  Fair aeration bilaterally. CVS:  RRR. Heart sounds normal.  ABD/GI/GU: BS+. Abd soft.  Mild RUQ tenderness.  Drain in place. MSK/EXT:  Moves extremities. No apparent deformity. No edema.  SKIN: no apparent skin lesion or wound NEURO: Awake, alert and oriented appropriately.  No apparent focal neuro deficit. PSYCH: Calm.  No apparent distress.  Procedures:  11/14-EUS not successful due to food stuff in his stomach.  Pathology suggested esophagitis 11/16-EGD/EUS.  Pathology pending. 11/17-percutaneous cholecystostomy tube  Microbiology summarized: None  Assessment and plan: New pancreatic head mass/possible GOO/gallbladder distention: Patient presented with unintentional weight loss, poor p.o. intake and fatigue.  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  CA 19-9 normal at 13.  Patient underwent EGD/EUS on 11/16.  Significant abdominal pain after procedure.  Repeat CT on 11/16 with concern for possible GOO, GB distention, biliary ductal dilation and venous thrombosis.  Cholecystostomy drain placed by IR on 11/17.  Pain, LFT and bilirubin improved. -Appreciate guidance by GI. -Continue IV ceftriaxone and Flagyl -Continue full liquid diet per GI -Continue IV Protonix and p.o. Carafate -Monitor CMP and CBC. -Follow pathology from EUS and biliary sample.   Portal vein/superior mesenteric/splenic vein thrombosis: This unclear if this is blood clot or infiltrating malignancy. -Started IV heparin after discussion with GI   Hx alcoholic pancreatitis with pseudocyst: CT abdomen and pelvis as above.  Patient without significant abdominal pain, nausea or vomiting.  No significant elevation of lipase or LFT.  CT and MRI as above.  Elevated AST/ALP/hyperbilirubinemia: Mild.  Improved after  cholecystostomy tube. -Continue monitoring  Esophagitis: Noted on pathology from EGD on 11/14 -PPI and Carafate  Hypokalemia/hypomagnesemia: -Monitor replenish as appropriate  Metabolic alkalosis: Likely from GI loss.  Resolved.   Anxiety and depression: Understandably overwhelmed. 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 -Counseled on smoking cessation -Not interested in nicotine patch   Emphysema: No respiratory symptoms -Encouraged smoking cessation -As needed DuoNebs  Severe malnutrition.  Likely due to #1 Body mass index is 19.49 kg/m. Nutrition Problem: Severe Malnutrition Etiology: chronic illness (pancreatitis) Signs/Symptoms: energy intake < or equal to 75% for > or equal to 1 month, severe fat depletion, moderate muscle depletion, percent weight loss Interventions: Boost Breeze, MVI, Education   DVT prophylaxis:  On full dose anticoagulation  Code Status: Full code Family Communication: None at bedside Level of care: Telemetry Status is: Inpatient Remains inpatient appropriate because: Pancreatic mass, GOO, GB distention and venous thrombosis   Final disposition: Likely home Consultants:  Gastroenterology Oncology Interventional radiology  55 minutes with more than 50% spent in reviewing records, counseling patient/family and coordinating care.   Sch Meds:  Scheduled Meds:  feeding supplement  1 Container Oral TID BM   gabapentin  900 mg Oral QHS   mirtazapine  45 mg Oral QHS   pantoprazole (PROTONIX) IV  40 mg Intravenous Q12H   QUEtiapine  100-200 mg Oral QHS   sucralfate  1 g Oral TID WC & HS   Continuous Infusions:  cefTRIAXone (ROCEPHIN)  IV Stopped (12/14/22 1828)   heparin     metronidazole 100 mL/hr at 12/15/22 0630    PRN Meds:.acetaminophen **OR** acetaminophen, alum & mag hydroxide-simeth, bisacodyl,  HYDROmorphone (DILAUDID) injection, ipratropium-albuterol, ondansetron **OR** ondansetron (ZOFRAN) IV, oxyCODONE  Antimicrobials: Anti-infectives (From admission, onward)    Start     Dose/Rate Route Frequency Ordered Stop   12/12/22 2000  ciprofloxacin (CIPRO) tablet 500 mg  Status:  Discontinued        500 mg Oral 2 times daily 12/12/22 1006 12/12/22 1809   12/12/22 1830  cefTRIAXone (ROCEPHIN) 2 g in sodium chloride 0.9 % 100 mL IVPB        2 g 200 mL/hr over 30 Minutes Intravenous Every 24 hours 12/12/22 1809 12/17/22 1829   12/12/22 1830  metroNIDAZOLE (FLAGYL) IVPB 500 mg        500 mg 100 mL/hr over 60 Minutes Intravenous Every 12 hours 12/12/22 1809 12/17/22 1829        I have personally reviewed the following labs and images: CBC: Recent Labs  Lab 12/11/22 0552 12/12/22 1851 12/13/22 0611 12/14/22 0601 12/15/22 0556  WBC 7.6 24.9* 14.9* 10.1 8.0  HGB 13.5 15.5* 14.0 12.1 11.3*  HCT 40.2 47.5* 43.0 36.4 33.3*  MCV 100.8* 101.7* 102.4* 102.0* 99.7  PLT 397 604* 469* 352 348   BMP &GFR Recent Labs  Lab 12/10/22 0544 12/11/22 0552 12/12/22 1054 12/12/22 1851 12/13/22 0611 12/14/22 0601 12/15/22 0556  NA 135 136 133* 132* 133* 131* 131*  K 3.2* 4.2 4.4 4.6 5.0 4.3 3.5  CL 102 102 100 96* 105 103 102  CO2 26 27 25 28 23 22 24   GLUCOSE 103* 119* 100* 139* 144* 104* 104*  BUN 5* 6* 11 15 17 15 11   CREATININE 0.40* 0.43* 0.60  0.61 0.76 0.58 0.50  CALCIUM 7.8* 8.6* 8.2* 8.2* 7.7* 8.2* 7.8*  MG 2.2 2.3  --   --  3.3* 2.5* 2.2  PHOS  --   --   --   --   --  1.6*  --    Estimated Creatinine Clearance: 56.7 mL/min (by C-G formula based on SCr of 0.5 mg/dL). Liver & Pancreas: Recent Labs  Lab 12/11/22 0552 12/12/22 1851 12/13/22 0611 12/14/22 0601 12/15/22 0556  AST 38 43* 67* 46* 18  ALT 21 18 26  36 24  ALKPHOS 231* 208* 188* 194* 183*  BILITOT 0.4 1.8* 1.5* 1.3* 1.0  PROT 5.8* 6.4* 5.2* 4.9* 4.8*  ALBUMIN 2.7* 3.1* 2.4* 2.1* 2.0*   Recent Labs  Lab  12/11/22 1940 12/12/22 1851 12/13/22 0611  LIPASE 36 31 29   No results for input(s): "AMMONIA" in the last 168 hours. Diabetic: No results for input(s): "HGBA1C" in the last 72 hours. Recent Labs  Lab 12/14/22 1154 12/14/22 1736 12/15/22 0024 12/15/22 0614 12/15/22 1121  GLUCAP 91 109* 88 101* 141*   Cardiac Enzymes: No results for input(s): "CKTOTAL", "CKMB", "CKMBINDEX", "TROPONINI" in the last 168 hours. No results for input(s): "PROBNP" in the last 8760 hours. Coagulation Profile: Recent Labs  Lab 12/13/22 0611  INR 1.3*   Thyroid Function Tests: No results for input(s): "TSH", "T4TOTAL", "FREET4", "T3FREE", "THYROIDAB" in the last 72 hours. Lipid Profile: No results for input(s): "CHOL", "HDL", "LDLCALC", "TRIG", "CHOLHDL", "LDLDIRECT" in the last 72 hours. Anemia Panel: No results for input(s): "VITAMINB12", "FOLATE", "FERRITIN", "TIBC", "IRON", "RETICCTPCT" in the last 72 hours. Urine analysis:    Component Value Date/Time   COLORURINE YELLOW 07/24/2021 0827   APPEARANCEUR HAZY (A) 07/24/2021 0827   LABSPEC 1.019 07/24/2021 0827   PHURINE 5.0 07/24/2021 0827   GLUCOSEU NEGATIVE 07/24/2021 0827   HGBUR NEGATIVE 07/24/2021 0827   BILIRUBINUR NEGATIVE 07/24/2021 0827   KETONESUR NEGATIVE 07/24/2021 0827   PROTEINUR TRACE (A) 07/24/2021 0827   UROBILINOGEN 0.2 01/23/2010 1750   NITRITE NEGATIVE 07/24/2021 0827   LEUKOCYTESUR NEGATIVE 07/24/2021 0827   Sepsis Labs: Invalid input(s): "PROCALCITONIN", "LACTICIDVEN"  Microbiology: Recent Results (from the past 240 hour(s))  Aerobic/Anaerobic Culture w Gram Stain (surgical/deep wound)     Status: None (Preliminary result)   Collection Time: 12/13/22  5:00 PM   Specimen: BILE  Result Value Ref Range Status   Specimen Description   Final    BILE Performed at [facility], [address], [place], [place] [address]    Special Requests   Final    NONE Performed at [facility], [address], [place], [place] [address]    Gram Stain NO WBC SEEN NO ORGANISMS SEEN   Final   Culture   Final    NO GROWTH 2 DAYS NO ANAEROBES ISOLATED; CULTURE IN PROGRESS FOR 5 DAYS Performed at [facility], [address], [place], [place] [address]    Report Status PENDING  Incomplete    Radiology Studies: No results found.    Kortny Lirette T. Maruice Pieroni [facility]  If 7PM-7AM, please contact night-coverage [email or web address] 12/15/2022, 2:11 PM

## 2022-12-15 NOTE — Progress Notes (Addendum)
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   No Known Allergies  Patient Measurements: Height: 5\' 3"  (160 cm) Weight: 49.9 kg (110 lb) IBW/kg (Calculated) : 52.4 Heparin Dosing Weight: TBW  Vital Signs: Temp: 98.4 F (36.9 C) (11/19 0610) Temp Source: Oral (11/19 0610) BP: 101/68 (11/19 0610) Pulse Rate: 100 (11/19 0610)  Labs: Recent Labs    12/13/22 0611 12/14/22 0601 12/14/22 1939 12/15/22 0556 12/15/22 1144  HGB 14.0 12.1  --  11.3*  --   HCT 43.0 36.4  --  33.3*  --   PLT 469* 352  --  348  --   LABPROT 16.5*  --   --   --   --   INR 1.3*  --   --   --   --   HEPARINUNFRC  --   --  0.19* 0.37 0.27*  CREATININE 0.76 0.58  --  0.50  --     Estimated Creatinine Clearance: 56.7 mL/min (by C-G formula based on SCr of 0.5 mg/dL).  Assessment: 46 yoF admitted 11/12 for acute on chronic pancreatitis symptoms. Found to have a pancreatic mass concerning for malignancy. CT also reveals splenic, SMV, and portal vein thrombus. GI uncertain if this is true thrombus vs tumor compression, but for now would like Pharmacy to start IV heparin, until HemOnc can see on Monday. 11/15 heparin protocol per pharmacy ordered then cancelled for percutaneous biliary drain placement 11/18 pharmacy re-consulted to dose heparin   11/17 Baseline INR, mildly elevated; consistent with acute hepatic congestion from above processes, vitamin K 10 mg IV given 11/17 @ 0931 am  Prior anticoagulation: none PTA; on Lovenox 40 mg/d; last dose given 11/16 PM  Significant events:  11/17 percutaneous cholecystostomy tube placement.   Today, 12/15/2022: Confirmatory Heparin level 0.27 is slightly sub-therapeutic on heparin drip @ 950 units/hr CBC: Hg 11.3- down slightly, PLT WNL SCr stable < 1.0 No bleeding per nursing  Goal of Therapy: Heparin level 0.3-0.7 units/ml Monitor platelets by  anticoagulation protocol: Yes  Plan: increase Heparin to 1050 units/hr IV infusion and check heparin level in 6 hours Daily CBC, daily heparin level Monitor for signs of bleeding or thrombosis  [name], Pharm.D Use secure chat for questions 12/15/2022 1:26 PM

## 2022-12-16 DIAGNOSIS — K8689 Other specified diseases of pancreas: Secondary | ICD-10-CM | POA: Diagnosis not present

## 2022-12-16 DIAGNOSIS — C259 Malignant neoplasm of pancreas, unspecified: Secondary | ICD-10-CM | POA: Insufficient documentation

## 2022-12-16 DIAGNOSIS — K311 Adult hypertrophic pyloric stenosis: Secondary | ICD-10-CM | POA: Diagnosis not present

## 2022-12-16 DIAGNOSIS — K831 Obstruction of bile duct: Secondary | ICD-10-CM | POA: Diagnosis not present

## 2022-12-16 LAB — HEPATIC FUNCTION PANEL
ALT: 22 U/L (ref 0–44)
AST: 20 U/L (ref 15–41)
Albumin: 2.6 g/dL — ABNORMAL LOW (ref 3.5–5.0)
Alkaline Phosphatase: 224 U/L — ABNORMAL HIGH (ref 38–126)
Bilirubin, Direct: 0.3 mg/dL — ABNORMAL HIGH (ref 0.0–0.2)
Indirect Bilirubin: 0.7 mg/dL (ref 0.3–0.9)
Total Bilirubin: 1 mg/dL (ref <1.2)
Total Protein: 5.8 g/dL — ABNORMAL LOW (ref 6.5–8.1)

## 2022-12-16 LAB — HEPARIN LEVEL (UNFRACTIONATED): Heparin Unfractionated: 0.4 [IU]/mL (ref 0.30–0.70)

## 2022-12-16 LAB — RENAL FUNCTION PANEL
Albumin: 2.6 g/dL — ABNORMAL LOW (ref 3.5–5.0)
Anion gap: 8 (ref 5–15)
BUN: 5 mg/dL — ABNORMAL LOW (ref 8–23)
CO2: 26 mmol/L (ref 22–32)
Calcium: 8.4 mg/dL — ABNORMAL LOW (ref 8.9–10.3)
Chloride: 99 mmol/L (ref 98–111)
Creatinine, Ser: 0.46 mg/dL (ref 0.44–1.00)
GFR, Estimated: >60 mL/min (ref >60)
Glucose, Bld: 114 mg/dL — ABNORMAL HIGH (ref 70–99)
Phosphorus: 2.6 mg/dL (ref 2.5–4.6)
Potassium: 2.9 mmol/L — ABNORMAL LOW (ref 3.5–5.1)
Sodium: 133 mmol/L — ABNORMAL LOW (ref 135–145)

## 2022-12-16 LAB — MAGNESIUM: Magnesium: 2 mg/dL (ref 1.7–2.4)

## 2022-12-16 LAB — CBC
HCT: 39.9 % (ref 36.0–46.0)
Hemoglobin: 13.2 g/dL (ref 12.0–15.0)
MCH: 33.1 pg (ref 26.0–34.0)
MCHC: 33.1 g/dL (ref 30.0–36.0)
MCV: 100 fL (ref 80.0–100.0)
Platelets: 459 10*3/uL — ABNORMAL HIGH (ref 150–400)
RBC: 3.99 MIL/uL (ref 3.87–5.11)
RDW: 13 % (ref 11.5–15.5)
WBC: 8 10*3/uL (ref 4.0–10.5)
nRBC: 0 % (ref 0.0–0.2)

## 2022-12-16 LAB — GLUCOSE, CAPILLARY
Glucose-Capillary: 105 mg/dL — ABNORMAL HIGH (ref 70–99)
Glucose-Capillary: 119 mg/dL — ABNORMAL HIGH (ref 70–99)
Glucose-Capillary: 127 mg/dL — ABNORMAL HIGH (ref 70–99)
Glucose-Capillary: 124 mg/dL — ABNORMAL HIGH (ref 70–99)

## 2022-12-16 LAB — CYTOLOGY - NON PAP

## 2022-12-16 MED ORDER — ADULT MULTIVITAMIN W/MINERALS CH
1.0000 | ORAL_TABLET | Freq: Every day | ORAL | Status: DC
  Administered 2022-12-16 – 2022-12-18 (×3): 1 via ORAL
  Filled 2022-12-16 (×3): qty 1

## 2022-12-16 MED ORDER — ENSURE MAX PROTEIN PO LIQD
11.0000 [oz_av] | Freq: Two times a day (BID) | ORAL | Status: DC
  Administered 2022-12-16 – 2022-12-17 (×4): 11 [oz_av] via ORAL
  Filled 2022-12-16 (×6): qty 330

## 2022-12-16 MED ORDER — POTASSIUM CHLORIDE CRYS ER 20 MEQ PO TBCR
40.0000 meq | EXTENDED_RELEASE_TABLET | ORAL | Status: AC
  Administered 2022-12-16 (×2): 40 meq via ORAL
  Filled 2022-12-16 (×2): qty 2

## 2022-12-16 MED ORDER — POTASSIUM CHLORIDE 20 MEQ PO PACK: 40.0000 meq | PACK | ORAL | Status: DC

## 2022-12-16 NOTE — Consult Note (Addendum)
[facility]  [phone]) [phone]   HEMATOLOGY ONCOLOGY INPATIENT CONSULTATION   Kathryn Underwood  DOB: 1960-04-06  MR#: [id number]  CSN#: [id number]    Requesting Physician: [facility]  Patient Care Team: Allwardt, Crist Infante, PA-C as PCP - General (Physician Assistant) Geraldine Contras, MD as Referring Physician (Psychiatry)  Reason for consult: Newly diagnosed pancreatic cancer  History of present illness:   This is a 62 year old female with past medical history of alcohol abuse and pancreatitis, was recently diagnosed with pancreatic cancer.  I was consulted for her cancer management.  She reports severe abdominal pain, particularly after consuming meat, which has been ongoing for approximately three months. The pain is localized in the upper abdomen and is severe enough to cause the patient to induce vomiting for relief. The patient has been able to tolerate carbohydrates and scrambled eggs without significant discomfort. Over the past few months, the patient has experienced significant weight loss, dropping from 142 pounds to 110 pounds. The patient attributes this weight loss to decreased food intake due to the pain experienced after eating. The patient also reports severe dehydration and low energy levels prior to hospital admission. The patient has a gallbladder drainage tube and has been on a liquid diet since hospital admission. The patient has a history of alcohol consumption, consuming a large bottle of wine daily, but quit in 2023. The patient also has a history of smoking but has quit recently.   She was admitted to hospital on December 08, 2022. 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  EUS not successful on 11/14 due to foodstuff in stomach. Patient underwent EGD/EUS on 11/16.  After procedure, she has significant abdominal pain for which she received IV fentanyl twice  while in PACU.  Patient continued to have significant right-sided abdominal pain despite pain medications.  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  IR consulted and patient underwent percutaneous cholecystostomy on 11/17.     MEDICAL HISTORY:  Past Medical History:  Diagnosis Date   Alcohol abuse    Anxiety    Chickenpox    Depression    Neuromuscular disorder (HCC)    neuropathy   Substance abuse (HCC)     SURGICAL HISTORY: Past Surgical History:  Procedure Laterality Date   BIOPSY  12/10/2022   Procedure: BIOPSY;  Surgeon: Mansouraty, Netty Starring., MD;  Location: [facility];  Service: Gastroenterology;;   CESAREAN SECTION     ESOPHAGOGASTRODUODENOSCOPY N/A 08/14/2021   Procedure: ESOPHAGOGASTRODUODENOSCOPY (EGD);  Surgeon: Rachael Fee, MD;  Location: [facility];  Service: Gastroenterology;  Laterality: N/A;   ESOPHAGOGASTRODUODENOSCOPY N/A 12/12/2022   Procedure: ESOPHAGOGASTRODUODENOSCOPY (EGD);  Surgeon: Lemar Lofty., MD;  Location: [facility];  Service: Gastroenterology;  Laterality: N/A;   ESOPHAGOGASTRODUODENOSCOPY (EGD) WITH PROPOFOL N/A 12/10/2022   Procedure: ESOPHAGOGASTRODUODENOSCOPY (EGD) WITH PROPOFOL;  Surgeon: Meridee Score Netty Starring., MD;  Location: [facility];  Service: Gastroenterology;  Laterality: N/A;   EUS N/A 08/14/2021   Procedure: UPPER ENDOSCOPIC ULTRASOUND (EUS) RADIAL;  Surgeon: Rachael Fee, MD;  Location: [facility];  Service: Gastroenterology;  Laterality: N/A;   EUS N/A 12/12/2022   Procedure: UPPER ENDOSCOPIC ULTRASOUND (EUS) LINEAR;  Surgeon: Lemar Lofty., MD;  Location: [facility];  Service: Gastroenterology;  Laterality: N/A;   FINE NEEDLE ASPIRATION N/A  08/14/2021   Procedure: FINE NEEDLE ASPIRATION (FNA) LINEAR;  Surgeon: Rachael Fee, MD;  Location: [facility];  Service: Gastroenterology;  Laterality: N/A;   FINE NEEDLE ASPIRATION N/A 12/12/2022   Procedure: FINE NEEDLE ASPIRATION (FNA) LINEAR;  Surgeon: Lemar Lofty., MD;  Location: [facility];  Service: Gastroenterology;  Laterality: N/A;   IR PERC CHOLECYSTOSTOMY  12/13/2022   TONSILLECTOMY      SOCIAL HISTORY: Social History   Socioeconomic History   Marital status: Divorced    Spouse name: Not on file   Number of children: Not on file   Years of education: Not on file   Highest education level: Not on file  Occupational History   Not on file  Tobacco Use   Smoking status: Some Days    Current packs/day: 0.50    Types: Cigarettes   Smokeless tobacco: Never  Vaping Use   Vaping status: Never Used  Substance and Sexual Activity   Alcohol use: Not Currently    Alcohol/week: 16.0 standard drinks of alcohol    Types: 16 Glasses of wine per week    Comment: daily (1.5 liter bottle wine every 12)   Drug use: Not Currently    Types: Cocaine, Marijuana   Sexual activity: Yes    Partners: Male    Comment: female partner  Other Topics Concern   Not on file  Social History Narrative   Divorced. Homemaker. HS grad.    Lives with life partner (stan)   Drinks caffeine.   Wears seatbelt. Smoke dectector in the home. No firearms in the home.    Feels safe in her relationships.     Social Determinants of Health   Financial Resource Strain: Not on file  Food Insecurity: No Food Insecurity (12/08/2022)   Hunger Vital Sign    Worried About Running Out of Food in the Last Year: Never true    Ran Out of Food in the Last Year: Never true  Transportation Needs: No Transportation Needs (12/08/2022)   PRAPARE - Administrator, Civil Service (Medical): No    Lack of Transportation (Non-Medical): No  Physical Activity: Not on file  Stress: Not on file   Social Connections: Not on file  Intimate Partner Violence: Not At Risk (12/08/2022)   Humiliation, Afraid, Rape, and Kick questionnaire    Fear of Current or Ex-Partner: No    Emotionally Abused: No    Physically Abused: No    Sexually Abused: No    FAMILY HISTORY: Family History  Problem Relation Age of Onset   Dementia Mother    Arthritis Father    Asthma Father    Asthma Brother    Diabetes Maternal Grandmother    Dementia Maternal Grandmother    Diabetes Paternal Grandmother    Parkinson's disease Maternal Grandfather    Parkinson's disease Paternal Grandfather    Breast cancer Neg Hx    Colon cancer Neg Hx     ALLERGIES:  has No Known Allergies.  MEDICATIONS:  Current Facility-Administered Medications  Medication Dose Route Frequency Provider Last Rate Last Admin   acetaminophen (TYLENOL) tablet 650 mg  650 mg Oral Q6H PRN Steffanie Rainwater, MD   650 mg at 12/12/22 1826   Or   acetaminophen (TYLENOL) suppository 650 mg  650 mg Rectal Q6H PRN Steffanie Rainwater, MD       alum & mag hydroxide-simeth (MAALOX/MYLANTA) 200-200-20 MG/5ML suspension 30 mL  30  mL Oral Q4H PRN Candelaria Stagers T, MD   30 mL at 12/12/22 1517   bisacodyl (DULCOLAX) suppository 10 mg  10 mg Rectal Daily PRN Almon Hercules, MD       gabapentin (NEURONTIN) capsule 900 mg  900 mg Oral QHS Steffanie Rainwater, MD   900 mg at 12/16/22 2107   heparin ADULT infusion 100 units/mL (25000 units/281mL)  1,050 Units/hr Intravenous Continuous [name], RPH 10.5 mL/hr at 12/16/22 1834 1,050 Units/hr at 12/16/22 1834   HYDROmorphone (DILAUDID) injection 0.5 mg  0.5 mg Intravenous Q3H PRN Candelaria Stagers T, MD   0.5 mg at 12/16/22 1528   ipratropium-albuterol (DUONEB) 0.5-2.5 (3) MG/3ML nebulizer solution 3 mL  3 mL Nebulization Q6H PRN Steffanie Rainwater, MD       metroNIDAZOLE (FLAGYL) IVPB 500 mg  500 mg Intravenous Q12H Candelaria Stagers T, MD 100 mL/hr at 12/16/22 2114 500 mg at 12/16/22 2114   mirtazapine  (REMERON) tablet 45 mg  45 mg Oral QHS Steffanie Rainwater, MD   45 mg at 12/16/22 2107   multivitamin with minerals tablet 1 tablet  1 tablet Oral Daily Almon Hercules, MD   1 tablet at 12/16/22 0951   ondansetron (ZOFRAN) tablet 4 mg  4 mg Oral Q6H PRN Steffanie Rainwater, MD       Or   ondansetron [facility]) injection 4 mg  4 mg Intravenous Q6H PRN Steffanie Rainwater, MD   4 mg at 12/12/22 1827   oxyCODONE (Oxy IR/ROXICODONE) immediate release tablet 5 mg  5 mg Oral Q4H PRN Candelaria Stagers T, MD   5 mg at 12/16/22 2125   pantoprazole (PROTONIX) injection 40 mg  40 mg Intravenous Q12H Candelaria Stagers T, MD   40 mg at 12/16/22 2108   protein supplement (ENSURE MAX) liquid  11 oz Oral BID Candelaria Stagers T, MD   11 oz at 12/16/22 2126   QUEtiapine (SEROQUEL) tablet 100-200 mg  100-200 mg Oral QHS Steffanie Rainwater, MD   100 mg at 12/16/22 2107   sucralfate (CARAFATE) 1 GM/10ML suspension 1 g  1 g Oral TID WC & HS Mansouraty, Netty Starring., MD   1 g at 12/16/22 2107    REVIEW OF SYSTEMS:   Constitutional: Denies fevers, chills or abnormal night sweats, (+) weight loss and fatigue  Eyes: Denies blurriness of vision, double vision or watery eyes Ears, nose, mouth, throat, and face: Denies mucositis or sore throat Respiratory: Denies cough, dyspnea or wheezes Cardiovascular: Denies palpitation, chest discomfort or lower extremity swelling Gastrointestinal:  see HPI  Skin: Denies abnormal skin rashes Lymphatics: Denies new lymphadenopathy or easy bruising Neurological:Denies numbness, tingling or new weaknesses Behavioral/Psych: Mood is stable, no new changes  All other systems were reviewed with the patient and are negative.  PHYSICAL EXAMINATION: ECOG PERFORMANCE STATUS: 2 - Symptomatic, <50% confined to bed  Vitals:   12/16/22 0534 12/16/22 1401  BP: 112/74 (!) 137/95  Pulse: (!) 102 99  Resp: 18 20  Temp: 98.2 F (36.8 C) 98.5 F (36.9 C)  SpO2: 95% 98%   Filed Weights   12/08/22 1216   Weight: 110 lb (49.9 kg)    GENERAL:alert, no distress and comfortable SKIN: skin color, texture, turgor are normal, no rashes or significant lesions EYES: normal, conjunctiva are pink and non-injected, sclera clear OROPHARYNX:no exudate, no erythema and lips, buccal mucosa, and tongue normal  NECK: supple, thyroid normal size, non-tender, without nodularity LYMPH:  no palpable lymphadenopathy  in the cervical, axillary or inguinal LUNGS: clear to auscultation and percussion with normal breathing effort HEART: regular rate & rhythm and no murmurs and no lower extremity edema ABDOMEN:abdomen soft, non-tender and normal bowel sounds Musculoskeletal:no cyanosis of digits and no clubbing  PSYCH: alert & oriented x 3 with fluent speech NEURO: no focal motor/sensory deficits  LABORATORY DATA:  I have reviewed the data as listed Lab Results  Component Value Date   WBC 8.0 12/16/2022   HGB 13.2 12/16/2022   HCT 39.9 12/16/2022   MCV 100.0 12/16/2022   PLT 459 (H) 12/16/2022   Recent Labs    12/08/22 1231 12/08/22 1236 12/13/22 0611 12/14/22 0601 12/15/22 0556 12/16/22 0554  NA  --    < > 133* 131* 131* 133*  K  --    < > 5.0 4.3 3.5 2.9*  CL  --    < > 105 103 102 99  CO2  --    < > 23 22 24 26   GLUCOSE  --    < > 144* 104* 104* 114*  BUN  --    < > 17 15 11  5*  CREATININE  --    < > 0.76 0.58 0.50 0.46  CALCIUM  --    < > 7.7* 8.2* 7.8* 8.4*  GFRNONAA  --    < > >60 >60 >60 >60  PROT 7.0   < > 5.2* 4.9* 4.8* 5.8*  ALBUMIN 3.5   < > 2.4* 2.1* 2.0* 2.6*  2.6*  AST 26   < > 67* 46* 18 20  ALT 9   < > 26 36 24 22  ALKPHOS 88   < > 188* 194* 183* 224*  BILITOT 0.6   < > 1.5* 1.3* 1.0 1.0  BILIDIR 0.1  --  0.7*  --   --  0.3*  IBILI 0.5  --   --   --   --  0.7   < > = values in this interval not displayed.    RADIOGRAPHIC STUDIES: I have personally reviewed the radiological images as listed and agreed with the findings in the report. IR Perc Cholecystostomy  Addendum  Date: 12/13/2022   ADDENDUM REPORT: 12/13/2022 20:18 ADDENDUM: Contrast: 5 mL Omnipaque 300 Electronically Signed   By: Richarda Overlie M.D.   On: 12/13/2022 20:18   Result Date: 12/13/2022 INDICATION: 62 year old with abdominal pain and severe gallbladder distension. New fluid around the gallbladder after upper endoscopy and EUS. Concern for a pancreatic malignancy. EXAM: IMAGE GUIDED PERCUTANEOUS CHOLECYSTOSTOMY TUBE PLACEMENT MEDICATIONS: Inpatient and receiving IV antibiotics. ANESTHESIA/SEDATION: Moderate (conscious) sedation was employed during this procedure. A total of Versed 1.5 mg and Fentanyl 50 mcg was administered intravenously by the radiology nurse. Total intra-service moderate Sedation Time: 15 minutes. The patient's level of consciousness and vital signs were monitored continuously by radiology nursing throughout the procedure under my direct supervision. FLUOROSCOPY: Radiation Exposure Index (as provided by the fluoroscopic device): 0.1 mGy Kerma COMPLICATIONS: None immediate. PROCEDURE: Informed written consent was obtained from the patient after a thorough discussion of the procedural risks, benefits and alternatives. All questions were addressed. A timeout was performed prior to the initiation of the procedure. Patient was placed supine. The right side of the abdomen was prepped and draped in sterile fashion. Maximal barrier sterile technique was utilized including caps, mask, sterile gowns, sterile gloves, sterile drape, hand hygiene and skin antiseptic. Ultrasound demonstrated a very distended gallbladder. The skin was anesthetized using 1% lidocaine. Using  ultrasound guidance, 21 gauge needle was directed into the gallbladder and a 0.018 wire was placed. A transitional dilator set was placed. Wire was advanced into the gallbladder and the tract was dilated to accommodate a 10 [place] multipurpose drain. Approximately 200 mL of dark green fluid was removed. The gallbladder was decompressed at  the end of the procedure. Contrast injection confirmed placement in the gallbladder. Drain was sutured to skin and attached to a gravity bag. Fluid was sent for culture and cytology. Fluoroscopic and ultrasound images were taken and saved for documentation. FINDINGS: Gallbladder was severely distended. Fluid and edematous tissue around the gallbladder. Gallbladder was decompressed at the end of the procedure. 200 mL of fluid was removed. IMPRESSION: Successful image guided placement of a percutaneous cholecystostomy tube. Electronically Signed: By: Richarda Overlie M.D. On: 12/13/2022 19:47   CT ABDOMEN PELVIS W CONTRAST  Result Date: 12/12/2022 CLINICAL DATA:  Acute abdominal pain,  pancreatitis, pancreatic pass EXAM: CT ABDOMEN AND PELVIS WITH CONTRAST TECHNIQUE: Multidetector CT imaging of the abdomen and pelvis was performed using the standard protocol following bolus administration of intravenous contrast. RADIATION DOSE REDUCTION: This exam was performed according to the departmental dose-optimization program which includes automated exposure control, adjustment of the mA and/or kV according to patient size and/or use of iterative reconstruction technique. CONTRAST:  OMNIPAQUE IOHEXOL 300 MG/ML  SOLN COMPARISON:  12/09/2022, 12/08/2022 FINDINGS: Lower chest: No acute pleural or parenchymal lung disease. Stable pericardial effusion. Stable distal esophageal wall thickening. Small hiatal hernia. Hepatobiliary: Marked distension of the gallbladder is again noted, with intrahepatic duct dilation and proximal common bile duct distension, due to occlusion by the infiltrative pancreatic mass image previously. Heterogeneous appearance of the liver parenchyma within the inferior right lobe and extending along the gallbladder fossa could likely due to known portal vein thrombosis or geographic hepatic steatosis as seen on recent MRI. Multiple liver hypodensities are unchanged, previously characterized on MRI.  Pancreas: The infiltrative mass centered at the pancreatic head is again noted, measuring up to 6.1 x 4.4 cm, compatible with pancreatic neoplasm based on MRI findings. Upstream pancreatic parenchymal atrophy. Decreased fluid collections surrounding the pancreas, now measuring 5.6 x 2.5 cm image 36/2 and 2.4 x 2.2 cm reference image 44/2. Spleen: Normal in size without focal abnormality. Adrenals/Urinary Tract: Stable appearance of the adrenals and kidneys. Bladder is decompressed, limiting its evaluation. Stomach/Bowel: Prominent gastric distension again noted, with functional gastric outlet obstruction due to mass effect by the infiltrative pancreatic mass described above. Likely ulceration within the gastric antrum measuring up to 1.8 cm reference image 26/5. No evidence of perforation. No evidence of bowel obstruction or ileus. Vascular/Lymphatic: Continued occlusion of the splenic vein, SMV, and proximal aspect of the portal vein again noted due to extrinsic compression by the infiltrative pancreatic mass. There is nonocclusive thrombus within the main portal vein near the bifurcation. Extensive venous collaterals are seen throughout the mesentery unchanged since prior exam. Stable aortic atherosclerosis. No discrete pathologic adenopathy identified. Numerous subcentimeter mesenteric lymph nodes adjacent to the pancreatic mass in the upper abdomen are nonspecific. Reproductive: Calcified uterine fibroids unchanged. No adnexal masses. Other: Free fluid surrounds the distended gallbladder within the right upper quadrant. No free intraperitoneal gas. No abdominal wall hernia. Musculoskeletal: No acute or destructive bony abnormalities. Reconstructed images demonstrate no additional findings. IMPRESSION: 1. Stable infiltrating pancreatic mass, with thrombosis of the splenic vein, SMV, and proximal portal vein. Nonocclusive thrombus within the main portal vein just proximal to the bifurcation. 2. Decreased  size of  the peripancreatic fluid collections as above. New simple appearing free fluid surrounding a distended gallbladder and right upper quadrant could reflect interval rupture of these peripancreatic fluid collections. 3. Continued gastric distension compatible with functional gastric outlet obstruction due to the infiltrating pancreatic mass. Suspected ulcer within the gastric antrum, without evidence of perforation. 4. Marked gallbladder distension, with intrahepatic and extrahepatic biliary duct dilation, due to downstream common bile duct obstruction due to the pancreatic mass as seen on recent MRI. No evidence of gallbladder wall thickening or calcified gallstones. 5.  Aortic Atherosclerosis (ICD10-I70.0). Electronically Signed   By: Sharlet Salina M.D.   On: 12/12/2022 17:56   MR ABDOMEN MRCP W WO CONTAST  Result Date: 12/09/2022 CLINICAL DATA:  Pancreatitis with a pancreatic mass. EXAM: MRI ABDOMEN WITHOUT AND WITH CONTRAST (INCLUDING MRCP) TECHNIQUE: Multiplanar multisequence MR imaging of the abdomen was performed both before and after the administration of intravenous contrast. Heavily T2-weighted images of the biliary and pancreatic ducts were obtained, and three-dimensional MRCP images were rendered by post processing. CONTRAST:  5mL GADAVIST GADOBUTROL 1 MMOL/ML IV SOLN COMPARISON:  CT scans including 12/08/2022, 07/27/2021, 07/24/2021 for comparison. MRCP 08/03/2021. FINDINGS: Lower chest: Slight linear signal at the lung bases. Likely scar or atelectasis. Moderate pericardial effusion again identified. Hepatobiliary: There are geographic areas of signal drop on out of phase imaging in the liver particularly involving segments 5, 6 and 4 consistent with a geographic areas of fatty infiltration. There is mosaic appearance to enhancement of the liver in the arterial phase as well. As seen on prior CT there is thrombus within the main portal vein with several upper abdominal collateral vessels. Separate  multiple hepatic cysts identified which are predominantly under a cm in size. On series 27, image 11 in the dome of the right hepatic lobe segment 8 is a small enhancing nodule measuring 10 mm. This has mildly bright on T2 precontrast. This has an indeterminate lesion. There is moderate intrahepatic biliary ductal dilatation diffusely. The common hepatic duct has a diameter of 15 mm proximally with focal high-grade stricture over a long segment of the common duct extending down to the pancreatic head and ampulla. Gallbladder is dilated with layering sludge and possible stones. Pancreas: Dysmorphic appearance to the pancreas. They are smaller areas of residual pancreatic normal enhancement in the body and head/uncinate region. Centered along the neck and head region is a spiculated heterogeneous mass as seen on CT scan showing restricted diffusion. This has mass somewhat difficult to measure with the surrounding extensive other abnormality but is estimated in dimension on series 27, image 50 4.9 x 5.5 cm. This extends caudal to the pancreas into the central mesentery and is associated with occlusion of the splenic vein. There is encasement of the splenic artery. Extensive central mesenteric collaterals identified. There are several areas which has poor enhancement along the tail as well which could be the sequela of additional history of pancreatitis. The fluid collection extending anterior to the pancreas along the lesser sac on today's examination measures 6.0 by 5.2 cm. There are several other smaller collections identified adjacent to this larger. There is also a significant fluid collection in the central mesentery just caudal to the head of the pancreas on series 26, image 25 measuring 5.0 by 3.1 cm. In principle, these has a differential but easily could be pseudocysts. No associated nodular wall enhancement. Spleen:  Within normal limits in size and appearance. Adrenals/Urinary Tract: No masses identified. No  evidence of hydronephrosis.  Stomach/Bowel: Debris noted in the nondilated stomach. The visualized small and large bowel are nondilated. Scattered colonic stool. Vascular/Lymphatic: Atherosclerotic changes identified. Normal caliber aorta and IVC. 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 The aforementioned mass lesion does appear to encase the SMA with some narrowing. Other:  Mild ascites. Musculoskeletal: Mild degenerative changes along the spine. IMPRESSION: Large locally aggressive heterogeneous soft tissue mass with spiculations identified centered of the neck and head of the pancreas. Associated involvement of the SMA and SMV with the occlusion of the SMV and narrowing of the SMA. 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 Worrisome for pancreatic neoplasm or adenocarcinoma. Relatively poor visualization of the rest of the pancreas with the areas of distortion poor enhancement. This could be the sequela of neoplasm in the additional changes of pancreatitis. Several fluid collections are identified which could represent pseudo cysts with the largest extending along the lesser sac measuring up to 6 cm. Additional dominant collection seen caudal to the pancreatic head. 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 The gallbladder is also dilated with layering sludge and or stones. Multiple cystic liver lesions identified. There is 1 enhancing lesion in the dome in segment 8 which is indeterminate. With the pancreatic findings this could be spread of disease but would has a differential recommend attention on short follow-up. Geographic areas of fatty liver infiltration which can relate to the hepatic blood flow abnormalities.  Electronically Signed   By: Karen Kays M.D.   On: 12/09/2022 14:48   MR 3D Recon At Scanner  Result Date: 12/09/2022 CLINICAL DATA:  Pancreatitis with a pancreatic mass. EXAM: MRI ABDOMEN WITHOUT AND WITH CONTRAST (INCLUDING MRCP) TECHNIQUE: Multiplanar multisequence MR imaging of the abdomen was performed both before and after the administration of intravenous contrast. Heavily T2-weighted images of the biliary and pancreatic ducts were obtained, and three-dimensional MRCP images were rendered by post processing. CONTRAST:  5mL GADAVIST GADOBUTROL 1 MMOL/ML IV SOLN COMPARISON:  CT scans including 12/08/2022, 07/27/2021, 07/24/2021 for comparison. MRCP 08/03/2021. FINDINGS: Lower chest: Slight linear signal at the lung bases. Likely scar or atelectasis. Moderate pericardial effusion again identified. Hepatobiliary: There are geographic areas of signal drop on out of phase imaging in the liver particularly involving segments 5, 6 and 4 consistent with a geographic areas of fatty infiltration. There is mosaic appearance to enhancement of the liver in the arterial phase as well. As seen on prior CT there is thrombus within the main portal vein with several upper abdominal collateral vessels. Separate multiple hepatic cysts identified which are predominantly under a cm in size. On series 27, image 11 in the dome of the right hepatic lobe segment 8 is a small enhancing nodule measuring 10 mm. This has mildly bright on T2 precontrast. This has an indeterminate lesion. There is moderate intrahepatic biliary ductal dilatation diffusely. The common hepatic duct has a diameter of 15 mm proximally with focal high-grade stricture over a long segment of the common duct extending down to the pancreatic head and ampulla. Gallbladder is dilated with layering sludge and possible stones. Pancreas: Dysmorphic appearance to the pancreas. They are smaller areas of residual pancreatic normal enhancement in the body and  head/uncinate region. Centered along the neck and head region is a spiculated heterogeneous mass as seen on  CT scan showing restricted diffusion. This has mass somewhat difficult to measure with the surrounding extensive other abnormality but is estimated in dimension on series 27, image 50 4.9 x 5.5 cm. This extends caudal to the pancreas into the central mesentery and is associated with occlusion of the splenic vein. There is encasement of the splenic artery. Extensive central mesenteric collaterals identified. There are several areas which has poor enhancement along the tail as well which could be the sequela of additional history of pancreatitis. The fluid collection extending anterior to the pancreas along the lesser sac on today's examination measures 6.0 by 5.2 cm. There are several other smaller collections identified adjacent to this larger. There is also a significant fluid collection in the central mesentery just caudal to the head of the pancreas on series 26, image 25 measuring 5.0 by 3.1 cm. In principle, these has a differential but easily could be pseudocysts. No associated nodular wall enhancement. Spleen:  Within normal limits in size and appearance. Adrenals/Urinary Tract: No masses identified. No evidence of hydronephrosis. Stomach/Bowel: Debris noted in the nondilated stomach. The visualized small and large bowel are nondilated. Scattered colonic stool. Vascular/Lymphatic: Atherosclerotic changes identified. Normal caliber aorta and IVC. 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 The aforementioned mass lesion does appear to encase the SMA with some narrowing. Other:  Mild ascites. Musculoskeletal: Mild degenerative changes along the spine. IMPRESSION: Large locally aggressive heterogeneous soft tissue mass with spiculations identified centered of the neck and head of the pancreas. Associated  involvement of the SMA and SMV with the occlusion of the SMV and narrowing of the SMA. 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 Worrisome for pancreatic neoplasm or adenocarcinoma. Relatively poor visualization of the rest of the pancreas with the areas of distortion poor enhancement. This could be the sequela of neoplasm in the additional changes of pancreatitis. Several fluid collections are identified which could represent pseudo cysts with the largest extending along the lesser sac measuring up to 6 cm. Additional dominant collection seen caudal to the pancreatic head. 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 The gallbladder is also dilated with layering sludge and or stones. Multiple cystic liver lesions identified. There is 1 enhancing lesion in the dome in segment 8 which is indeterminate. With the pancreatic findings this could be spread of disease but would has a differential recommend attention on short follow-up. Geographic areas of fatty liver infiltration which can relate to the hepatic blood flow abnormalities. Electronically Signed   By: Karen Kays M.D.   On: 12/09/2022 14:48   CT CHEST ABDOMEN PELVIS W CONTRAST  Result Date: 12/08/2022 CLINICAL DATA:  Dysphagia, decreasing appetite and weight loss * Tracking Code: BO * EXAM: CT CHEST, ABDOMEN, AND PELVIS WITH CONTRAST TECHNIQUE: Multidetector CT imaging of the chest, abdomen and pelvis was performed following the standard protocol during bolus administration of intravenous contrast. RADIATION DOSE REDUCTION: This exam was performed according to the departmental dose-optimization program which includes automated exposure control, adjustment of the mA and/or kV according to patient size and/or use of iterative reconstruction technique. CONTRAST:  75mL  OMNIPAQUE IOHEXOL 300 MG/ML  SOLN COMPARISON:  CT chest, 07/24/2021, abdomen pelvis, 07/27/2021 FINDINGS: CT CHEST FINDINGS Cardiovascular: Scattered aortic atherosclerosis. Normal heart size. Left coronary artery calcifications. Small pericardial  effusion. Mediastinum/Nodes: No enlarged mediastinal, hilar, or axillary lymph nodes. Thyroid gland, trachea, and esophagus demonstrate no significant findings. Lungs/Pleura: Diminished ground-glass opacity of the medial left apex measuring 0.7 cm, previously 1.0 cm, nonspecific and infectious or inflammatory requiring no specific further follow-up (series 4, image 37). Mild centrilobular emphysema. Benign, bandlike scarring of the left lung base. No pleural effusion or pneumothorax. Musculoskeletal: No chest wall abnormality. No acute osseous findings. CT ABDOMEN PELVIS FINDINGS Hepatobiliary: Heterogeneous perfusion of the liver. Severe intra hepatic biliary ductal dilatation with a distended gallbladder. 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 Partial cavernous transformation. Pancreas: Very extensive, expansile soft tissue, fat stranding, and cystic change throughout the pancreatic head, encasing multiple adjacent structures including the portal vein and central mesenteric vessels, the superior mesenteric artery, and portions of the descending duodenum (series 2, image 66). Maximum apparent cross-section of the pancreatic head is 6.4 x 5.3 cm (series 2, image 71). Severe atrophy of the distal pancreatic parenchyma as well as diffuse ductal dilatation distally. Multiple fluid collections, including a collection overlying the pancreatic neck and body measuring 6.9 x 5.3 cm (series 2, image 69), and extending inferiorly from the lateral pancreatic head measuring 4.0 x 3.5 cm (series 2, image 77). Spleen:  Normal in size without significant abnormality. Adrenals/Urinary Tract: Adenomatous thickening of the adrenal glands, requiring no specific further follow-up or characterization. Kidneys are normal, without renal calculi, solid lesion, or hydronephrosis. Bladder is unremarkable. Stomach/Bowel: Stomach is within normal limits. Appendix appears normal. No evidence of bowel wall thickening, distention, or inflammatory changes. Vascular/Lymphatic: Aortic atherosclerosis. No enlarged abdominal or pelvic lymph nodes. Reproductive: Calcified uterine fibroids. Other: No abdominal wall hernia or abnormality. No ascites. Musculoskeletal: No acute osseous findings. IMPRESSION: 1. Very extensive, expansile soft tissue, fat stranding, and cystic change throughout the pancreatic head, encasing multiple adjacent structures including the portal vein and central mesenteric vessels, the superior mesenteric artery, and portions of the descending duodenum. Maximum apparent cross-section of the pancreatic head is 6.4 x 5.3 cm. 2. Severe intrahepatic biliary ductal dilatation with a distended gallbladder. 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 3. Multiple peripancreatic fluid collections, including a collection overlying the pancreatic neck and body measuring 6.9 x 5.3 cm, and extending inferiorly from the lateral pancreatic head measuring 4.0 x 3.5 cm, of uncertain acuity, consistent with acute pancreatic fluid collections or pseudocysts. The presence or absence of infection within this fluid is not established by imaging. 4. Constellation of findings is of uncertain significance, highly concerning for pancreatic adenocarcinoma complicated by pancreatitis, however at least conceivably this could reflect benign, although unusually severe acute on  chronic pancreatitis. 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 5. No evidence of lymphadenopathy or metastatic disease in the chest, abdomen, or pelvis. 6. Emphysema. 7. Coronary artery disease. Aortic Atherosclerosis (ICD10-I70.0) and Emphysema (ICD10-J43.9). Electronically Signed   By: Jearld Lesch M.D.   On: 12/08/2022 14:56    ASSESSMENT & PLAN:  62 year old female with history of alcohol abuse, pancreatitis, presented with progressive and 30 pound weight  loss.  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 Localized to the pancreas based on CT and MRI, no metastasis to liver or distant lymph nodes. Significant weight loss and malnutrition due to inability to eat solid foods. S/p EUS biopsy and percutaneous gallbladder drainage.  -Discussed aggressive nature, high recurrence risk, need for chemotherapy and potential surgery.  Due to her significant pancreatitis and cystic change in and peri-pancrease, upfront surgery is unlikely.  Chemotherapy to start post-hospitalization with port placement.  I discussed option of FOLFIRINOX/NALIRINOX, OR GEM/ABRAXANE  - Order CT scan of the chest to complete the staging - Discuss case with pancreatic surgeons and radiation oncologists - Place port for chemotherapy - Initiate chemotherapy post-hospitalization - Involve dietician and social worker for nutritional support - Schedule follow-up with oncology team  Pancreatitis Pancreatitis since July 2023, recent exacerbation leading to hospitalization. Severe pain after consuming solid foods, managed with bicarbonate and induced vomiting. Current management includes gallbladder drainage providing some relief. - Continue managing gallbladder drainage - Assess and manage pain - Monitor for infection or  complications  ? 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 Significant weight loss, admitted with low blood pressure and thickened blood. Currently on IV fluids and nutritional supplements. - Continue IV fluids - Monitor nutritional intake and weight - Administer Ensure Max and other supplements - Involve dietician for ongoing support   Recommendations: -CT chest without contrast to complete staging -Will ask IR to place port cath -advance diet per GI  -I will f/u after discharge   All questions were answered.  I spent a total of 60 minutes for her visit today, including 15 minutes on face-to-face counseling.  The patient knows to call the clinic with any problems, questions or concerns.      Malachy Mood, MD 12/16/2022

## 2022-12-16 NOTE — Progress Notes (Signed)
Nutrition Follow-up  DOCUMENTATION CODES:   Severe malnutrition in context of chronic illness  INTERVENTION:   -Ensure MAX Protein po BID, each supplement provides 150 kcal and 30 grams of protein   -Multivitamin with minerals daily  -Needs updated weight for admission  NUTRITION DIAGNOSIS:   Severe Malnutrition related to chronic illness (pancreatitis) as evidenced by energy intake < or equal to 75% for > or equal to 1 month, severe fat depletion, moderate muscle depletion, percent weight loss.  Ongoing.  GOAL:   Patient will meet greater than or equal to 90% of their needs  Progressing.  MONITOR:   PO intake, Supplement acceptance, Labs, Weight trends, Diet advancement, I & O's  ASSESSMENT:   62 y.o. lady with history of alcohol abuse, alcoholic pancreatitis, GERD, anxiety, depression, nicotine dependence, was admitted to the hospital overnight after she presented to the [facility] ED with complaints of decreased appetite over the last few months, unintentional weight loss, fatigue.  CT chest, abdomen and pelvis showed new mass in the head of the pancreas, concerning for possible pancreatic adenocarcinoma, complicated by pancreatitis.  11/12: admitted 11/14: upper GI endoscopy -erosive esophagitis, benign esophageal stenosis 11/16: upper EUS 11/17: s/p Percutaneous cholecystostomy tube placement   Per GI note, biopsies confirmed pancreatic adenocarcinoma. Pt to remain on full liquids but tolerating. Consuming 100% of trays.  Added Ensure Max to provide protein to liquid diet. Added MVI as well given poor PO for extended period of time PTA.   Admission weight: 110 lbs Needs updated weight for admission.  Medications: Remeron, KLOR-CON, Carafate  Labs reviewed: CBGs: 101-141 Low Na Low K  Diet Order:   Diet Order             Diet full liquid Room service appropriate? Yes; Fluid consistency: Thin  Diet effective now                   EDUCATION NEEDS:    Education needs have been addressed  Skin:  Skin Assessment: Reviewed RN Assessment  Last BM:  11/19  Height:   Ht Readings from Last 1 Encounters:  12/08/22 5\' 3"  (1.6 m)    Weight:   Wt Readings from Last 1 Encounters:  12/08/22 49.9 kg    BMI:  Body mass index is 19.49 kg/m.  Estimated Nutritional Needs:   Kcal:  1550-1750  Protein:  75-90g  Fluid:  1.8L/day  Tilda Franco, MS, RD, LDN Inpatient Clinical Dietitian Contact information available via Amion

## 2022-12-16 NOTE — Progress Notes (Addendum)
[facility] Progress Note  CC:   Abdominal pain, weight loss, pancreatic mass   Subjective: I informed the patient that her pancreatic biopsies confirmed adenocarcinoma.  She remains optimistic and questions when she will be seen by the oncology team.  She denies having any nausea or vomiting.  No abdominal pain.  She is having some lower back pain.  She passed a brown soft bowel movement yesterday.  No rectal bleeding or black stools.  No chest pain or shortness of breath.  No family at the bedside.   Objective:  Vital signs in last 24 hours: Temp:  [98.2 F (36.8 C)-98.7 F (37.1 C)] 98.2 F (36.8 C) (11/20 0534) Pulse Rate:  [93-102] 102 (11/20 0534) Resp:  [18] 18 (11/20 0534) BP: (112-127)/(74-88) 112/74 (11/20 0534) SpO2:  [95 %-97 %] 95 % (11/20 0534) Last BM Date : 12/15/22 General: Alert 62 year old female in no acute distress. Heart: Regular rate and rhythm, no murmurs. Pulm: Breath sounds clear throughout. Abdomen: Soft, very mild tenderness to the central abdomen without rebound or guarding.  Positive bowel sounds to all 4 quadrants. RUQ perc drain with 100cc bilious fluid in collection bag.  Extremities:  No edema.  Neurologic:  Alert and  oriented x 4. Grossly normal neurologically. Psych:  Alert and cooperative. Normal mood and affect.  Intake/Output from previous day: 11/19 0701 - 11/20 0700 In: 853.3 [P.O.:360; I.V.:162.8; IV Piggyback:320.6] Out: 1800 [Urine:1600; Drains:200] Intake/Output this shift: Total I/O In: 83.6 [I.V.:14.7; IV Piggyback:68.9] Out: -   Lab Results: Recent Labs    12/14/22 0601 12/15/22 0556 12/16/22 0554  WBC 10.1 8.0 8.0  HGB 12.1 11.3* 13.2  HCT 36.4 33.3* 39.9  PLT 352 348 459*   BMET Recent Labs    12/14/22 0601 12/15/22 0556 12/16/22 0554  NA 131* 131* 133*  K 4.3 3.5 2.9*  CL 103 102 99  CO2 22 24 26   GLUCOSE 104* 104* 114*  BUN 15 11 5*  CREATININE 0.58 0.50 0.46  CALCIUM 8.2* 7.8* 8.4*    LFT Recent Labs    12/16/22 0554  PROT 5.8*  ALBUMIN 2.6*  2.6*  AST 20  ALT 22  ALKPHOS 224*  BILITOT 1.0  BILIDIR 0.3*  IBILI 0.7   PT/INR No results for input(s): "LABPROT", "INR" in the last 72 hours. Hepatitis Panel No results for input(s): "HEPBSAG", "HCVAB", "HEPAIGM", "HEPBIGM" in the last 72 hours.  No results found.  Brief Narrative: 62 y.o. female with a past medical history significant for anxiety, depression, GERD, neuropathy, alcoholic pancreatitis, pancreatic pseudocyst and alcohol abuse who presented to the ER due to decreased appetite over the past few months with associated weight loss, concern for pancreatic mass. CT abdomen and pelvis showed expansile soft tissue, fat stranding and cystic change throughout the pancreatic head, encasing multiple adjacent structures including the portal vein and central mesenteric vessels, severe intrahepatic biliary ductal dilation with a distended gallbladder, portal vein effaced and occluded, multiple peripancreatic fluid collections including a collection overlying the pancreatic neck and body measuring 6.9 x 5.3 cm, discussed highly concerning for pancreatic adenocarcinoma complicated by pancreatitis   Assessment / Plan:  Weight loss of 30 lbs over last year with abnormal CT of the pancreas concerning for adenocarcinoma, in setting of previous alcohol induced chronic pancreatitis CA 19-9 level 311.  12/12/2022 CTAP showed stable infiltrating pancreatic mass with thrombosis of splenic vein, SMV proximal portal vein nonocclusive thrombus main portal vein, decreased size peripancreatic fluid collections continue gastric  distention compatible with gastric outlet obstruction, marked gallbladder distention with intrahepatic extraparotid biliary ductal dilation 11/13 MRCP large locally aggressive heterogeneous soft tissue mass with spiculations identified centered of the neck and head of the pancreas associated SMA, SMV and occlusion  of SMV and narrowing of SMA worrisome for neoplasm/adenocarcinoma several fluid collections up to 6 cm moderate intrahepatic biliary ductal dilation with focal presumed malignant stricture of distal common bile duct with enhancement and shoulder nodularity along the course of the duct gallbladder dilated with layering sludge and stones 1 enhancing lesion in the dome of the liver segment 8 which is indeterminate 12/12/2022 CT abdomen pelvis with contrast stable infiltrating pancreatic mass with thrombosis of splenic vein, SMV proximal portal vein nonocclusive thrombus main portal vein, decreased size peripancreatic fluid collections continue gastric distention compatible with gastric outlet obstruction, marked gallbladder distention with intrahepatic extraparotid biliary ductal dilation 11/16 Redo EUS  foodstuff still limiting examination however progressive distal stomach/proximal duodenal inflammation and congestion area cannot be transversed with linear EUS scope or ERCP scope.  Sampled multiple cysts as well as what seemed to be masslike soft tissue. Path report confirmed pancreatic adenocarcinoma to the pancreatic neck lesion  -Oncology consult  -Pain management and IV fluids per the hospitalist  -Await further recommendations per Dr. 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 T. Bili 1.0. Alk phos 183 -> 224. AST 20. ALT 22. MRCP moderate intrahepatic biliary ductal dilation and focal presumed malignant stricture of distal common bile duct with nodularity along the course of the duct gallbladder dilated with sludge and stones 11/16 EUS unable to transverse distal stomach/proximal duodenum secondary to inflammation and congestion 11/17 IR PERC cholecystostomy secondary to bili obstruction 200 mL dark green fluid aspirated -Preliminary culture no WBC no organisms, no growth day 3 -Gallbladder fluid drain cytology pending    Gastric outlet  obstruction secondary to pancreatic mass Esophageal biopsy: severe acute non specific esophagitis- pending GMS and CMV stains -Continue Protonix IV 40 mg twice daily -Carafate 1 g 4 times daily -Continue full liquid diet -Consider antral stenting depending clinical course   Thrombosis of splenic vein, 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 Started on IV heparin 11/18.   Leukocytosis -On Rocephin and Flagyl started 11/16      Moderate protein calorie malnutrition, secondary to pancreatic adenocarcinoma - RD consult -Count calories, increase protein -Continue Ensure twice daily    Principal Problem:   Pancreatic mass Active Problems:   Alcohol-induced chronic pancreatitis (HCC)   Protein malnutrition (HCC)   Unintentional weight loss   Esophageal dysphagia   Dilation of pancreatic duct   Protein-calorie malnutrition, severe   Acute esophagitis   Abnormal CT scan, gastrointestinal tract   High serum carbohydrate antigen 19-9 (CA19-9)   Pancreatic cyst   Biliary obstruction   Gastric outlet obstruction    LOS: 8 days   Arnaldo Natal  12/16/2022, 11:52AM    Attending Physician Note   I have taken an interval history, reviewed the chart and examined the patient. I performed a substantive portion of this encounter, including complete performance of at least one of the key components, in conjunction with the APP. I agree with the APP's note, impression and recommendations with my edits. My additional impressions and recommendations are as follows.   Pancreas adenocarcinoma with biliary obstruction, partial gastric outlet obstruction, GERD with LA Grade D esophagitis and SMV, splenic vein and portal vein thrombosis. Tolerating full  liquids. Pancreatic cancer diagnosis discussed with patient and questions addressed.  S/P cholecystostomy tube with improvement in LFTs  Continue full liquids - would not advance Oncology consult  pending Continue pantoprazole 40 mg po bid now and at discharge IR to consider biliary drain/biliary stent placement when cholecystostomy tube is removed  Outpatient GI follow up with Dr. Marina Goodell GI signing off  Claudette Head, MD [facility], [facility], for our on call provider

## 2022-12-16 NOTE — Progress Notes (Signed)
Consult placed to IV team. Per unit RN pt has sufficient PIV access and additional is not needed at this time.

## 2022-12-16 NOTE — Progress Notes (Signed)
PROGRESS NOTE  Kathryn Underwood UEA:[id number] DOB: [birth year]   PCP: Bary Leriche, PA-C  Patient is from: Home.  DOA: 12/08/2022 LOS: 8  Chief complaints Chief Complaint  Patient presents with   Weight Loss   Weakness     Brief Narrative / Interim history: 62 year old F with PMH of EtOH pancreatitis with pseudocyst, alcohol use disorder, neuropathy, anxiety, depression, GERD and tobacco use disorder presenting with decreased oral intake, fatigue and unintentional weight loss for about 2 months and found to have pancreatic mass on imaging.  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  EUS not successful on 11/14 due to foodstuff in stomach.  Patient underwent EGD/EUS on 11/16.  After procedure, she has significant abdominal pain for which she received IV fentanyl twice while in PACU.  Patient continued to have significant right-sided abdominal pain despite pain medications.  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  IR consulted and patient underwent percutaneous cholecystostomy on 11/17.    Pancreatic biopsy confirmed adenocarcinoma.  Oncology consulted.  Subjective: Seen and examined earlier this morning.  Reports to sleep last night due to frequent urination.  She denies dysuria.  Feels better this morning.  Tolerating full liquid diet.  Objective: Vitals:   12/15/22 0610 12/15/22 2018 12/16/22 0534 12/16/22 1401  BP: 101/68 127/88 112/74 (!) 137/95  Pulse: 100 93 (!) 102 99  Resp: 18 18 18 20   Temp: 98.4 F (36.9 C) 98.7 F (37.1 C) 98.2 F (36.8 C) 98.5 F (36.9 C)  TempSrc: Oral Oral Oral Oral  SpO2: 95% 97% 95% 98%  Weight:       Height:        Examination:  GENERAL: No apparent distress.  Nontoxic. HEENT: MMM.  Vision and hearing grossly intact.  NECK: Supple.  No apparent JVD.  RESP:  No IWOB.  Fair aeration bilaterally. CVS:  RRR. Heart sounds normal.  ABD/GI/GU: BS+. Abd soft.  Mild RUQ tenderness.  Drain in place. MSK/EXT:  Moves extremities. No apparent deformity. No edema.  SKIN: no apparent skin lesion or wound NEURO: Awake, alert and oriented appropriately.  No apparent focal neuro deficit. PSYCH: Calm.  No apparent distress.  Procedures:  11/14-EUS not successful due to food stuff in his stomach.  Pathology suggested esophagitis 11/16-EGD/EUS.  Pathology confirmed adenocarcinoma 11/17-percutaneous cholecystostomy tube  Microbiology summarized: None  Assessment and plan: Pancreatic cancer: Patient presented with unintentional weight loss, poor p.o. intake and fatigue.  Initial CT and MRCP concerning for large locally aggressive pancreatic neoplasm/adenocarcinoma with vascular and biliary involvement.  EGD/EUS on 11/16.  Pathology confirmed pancreatic adenocarcinoma.   -Appreciate guidance by GI. -Oncology consulted by GI  Gastric outlet obstruction/gallbladder distention due to pancreatic head cancer: Significant abdominal pain after EUS on 11/16.  CT showed GOO, GB distention, biliary ductal dilation and venous thrombosis.  Cholecystostomy drain placed by IR on 11/17.  Pain, LFT and bilirubin improved. -Continue IV ceftriaxone and Flagyl -Continue full liquid diet per GI -Continue IV Protonix and p.o. Carafate -Monitor CMP and CBC.  Portal vein/superior mesenteric/splenic vein thrombosis: Noted on CT on 11/16.  Unclear if this is blood clot or infiltrating malignancy. -Started IV heparin after discussion with GI   Hx alcoholic pancreatitis with pseudocyst: CT abdomen and  pelvis as above.  Patient without significant abdominal pain, nausea or vomiting.  No significant elevation of lipase or  LFT.  CT and MRI as above.  Elevated AST/ALP/hyperbilirubinemia: Mild.  Improved after cholecystostomy tube. -Continue monitoring  Esophagitis: Noted on pathology from EGD on 11/14 -PPI and Carafate  Hypokalemia/hypomagnesemia: -Monitor replenish as appropriate  Metabolic alkalosis: Likely from GI loss.  Resolved.   Anxiety and depression: Understandably overwhelmed. 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 -Counseled on smoking cessation -Not interested in nicotine patch   Emphysema: No respiratory symptoms -Encouraged smoking cessation -As needed DuoNebs  Severe malnutrition.  Likely due to #1 Body mass index is 19.49 kg/m. Nutrition Problem: Severe Malnutrition Etiology: chronic illness (pancreatitis) Signs/Symptoms: energy intake < or equal to 75% for > or equal to 1 month, severe fat depletion, moderate muscle depletion, percent weight loss Interventions: Boost Breeze, MVI, Education   DVT prophylaxis:  On full dose anticoagulation  Code Status: Full code Family Communication: None at bedside Level of care: Med-Surg Status is: Inpatient Remains inpatient appropriate because: Pancreatic cancer, GOO, venous thrombosis and electrolyte derangement   Final disposition: Likely home Consultants:  Gastroenterology Interventional radiology Oncology  55 minutes with more than 50% spent in reviewing records, counseling patient/family and coordinating care.   Sch Meds:  Scheduled Meds:  gabapentin  900 mg Oral QHS   mirtazapine  45 mg Oral QHS   multivitamin with minerals  1 tablet Oral Daily   pantoprazole (PROTONIX) IV  40 mg Intravenous Q12H   Ensure Max Protein  11 oz Oral BID   QUEtiapine  100-200 mg Oral QHS   sucralfate  1 g Oral TID WC & HS   Continuous Infusions:  cefTRIAXone (ROCEPHIN)  IV Stopped (12/15/22 1706)   heparin 1,050  Units/hr (12/16/22 0756)   metronidazole Stopped (12/16/22 0713)    PRN Meds:.acetaminophen **OR** acetaminophen, alum & mag hydroxide-simeth, bisacodyl, HYDROmorphone (DILAUDID) injection, ipratropium-albuterol, ondansetron **OR** ondansetron (ZOFRAN) IV, oxyCODONE  Antimicrobials: Anti-infectives (From admission, onward)    Start     Dose/Rate Route Frequency Ordered Stop   12/12/22 2000  ciprofloxacin (CIPRO) tablet 500 mg  Status:  Discontinued        500 mg Oral 2 times daily 12/12/22 1006 12/12/22 1809   12/12/22 1830  cefTRIAXone (ROCEPHIN) 2 g in sodium chloride 0.9 % 100 mL IVPB        2 g 200 mL/hr over 30 Minutes Intravenous Every 24 hours 12/12/22 1809 12/17/22 1829   12/12/22 1830  metroNIDAZOLE (FLAGYL) IVPB 500 mg        500 mg 100 mL/hr over 60 Minutes Intravenous Every 12 hours 12/12/22 1809 12/17/22 1829        I have personally reviewed the following labs and images: CBC: Recent Labs  Lab 12/12/22 1851 12/13/22 0611 12/14/22 0601 12/15/22 0556 12/16/22 0554  WBC 24.9* 14.9* 10.1 8.0 8.0  HGB 15.5* 14.0 12.1 11.3* 13.2  HCT 47.5* 43.0 36.4 33.3* 39.9  MCV 101.7* 102.4* 102.0* 99.7 100.0  PLT 604* 469* 352 348 459*   BMP &GFR Recent Labs  Lab 12/11/22 0552 12/12/22 1054 12/12/22 1851 12/13/22 0611 12/14/22 0601 12/15/22 0556 12/16/22 0554  NA 136   < > 132* 133* 131* 131* 133*  K 4.2   < > 4.6 5.0 4.3 3.5 2.9*  CL 102   < > 96* 105 103 102 99  CO2 27   < > 28 23 22 24 26   GLUCOSE 119*   < > 139* 144* 104* 104* 114*  BUN 6*   < > 15 17 15 11  5*  CREATININE 0.43*   < > 0.61 0.76 0.58 0.50 0.46  CALCIUM 8.6*   < > 8.2* 7.7* 8.2* 7.8* 8.4*  MG 2.3  --   --  3.3* 2.5* 2.2 2.0  PHOS  --   --   --   --  1.6*  --  2.6   < > = values in this interval not displayed.   Estimated Creatinine Clearance: 56.7 mL/min (by C-G formula based on SCr of 0.46 mg/dL). Liver & Pancreas: Recent Labs  Lab 12/12/22 1851 12/13/22 0611 12/14/22 0601  12/15/22 0556 12/16/22 0554  AST 43* 67* 46* 18 20  ALT 18 26 36 24 22  ALKPHOS 208* 188* 194* 183* 224*  BILITOT 1.8* 1.5* 1.3* 1.0 1.0  PROT 6.4* 5.2* 4.9* 4.8* 5.8*  ALBUMIN 3.1* 2.4* 2.1* 2.0* 2.6*  2.6*   Recent Labs  Lab 12/11/22 1940 12/12/22 1851 12/13/22 0611  LIPASE 36 31 29   No results for input(s): "AMMONIA" in the last 168 hours. Diabetic: No results for input(s): "HGBA1C" in the last 72 hours. Recent Labs  Lab 12/15/22 1121 12/15/22 1748 12/16/22 0146 12/16/22 0621 12/16/22 1151  GLUCAP 141* 119* 105* 119* 127*   Cardiac Enzymes: No results for input(s): "CKTOTAL", "CKMB", "CKMBINDEX", "TROPONINI" in the last 168 hours. No results for input(s): "PROBNP" in the last 8760 hours. Coagulation Profile: Recent Labs  Lab 12/13/22 0611  INR 1.3*   Thyroid Function Tests: No results for input(s): "TSH", "T4TOTAL", "FREET4", "T3FREE", "THYROIDAB" in the last 72 hours. Lipid Profile: No results for input(s): "CHOL", "HDL", "LDLCALC", "TRIG", "CHOLHDL", "LDLDIRECT" in the last 72 hours. Anemia Panel: No results for input(s): "VITAMINB12", "FOLATE", "FERRITIN", "TIBC", "IRON", "RETICCTPCT" in the last 72 hours. Urine analysis:    Component Value Date/Time   COLORURINE YELLOW 07/24/2021 0827   APPEARANCEUR HAZY (A) 07/24/2021 0827   LABSPEC 1.019 07/24/2021 0827   PHURINE 5.0 07/24/2021 0827   GLUCOSEU NEGATIVE 07/24/2021 0827   HGBUR NEGATIVE 07/24/2021 0827   BILIRUBINUR NEGATIVE 07/24/2021 0827   KETONESUR NEGATIVE 07/24/2021 0827   PROTEINUR TRACE (A) 07/24/2021 0827   UROBILINOGEN 0.2 01/23/2010 1750   NITRITE NEGATIVE 07/24/2021 0827   LEUKOCYTESUR NEGATIVE 07/24/2021 0827   Sepsis Labs: Invalid input(s): "PROCALCITONIN", "LACTICIDVEN"  Microbiology: Recent Results (from the past 240 hour(s))  Aerobic/Anaerobic Culture w Gram Stain (surgical/deep wound)     Status: None (Preliminary result)   Collection Time: 12/13/22  5:00 PM   Specimen:  BILE  Result Value Ref Range Status   Specimen Description   Final    BILE Performed at [facility], [address] [place], [place] [address]    Special Requests   Final    NONE Performed at [facility], [address] [place], [place] [address]    Gram Stain NO WBC SEEN NO ORGANISMS SEEN   Final   Culture   Final    NO GROWTH 3 DAYS NO ANAEROBES ISOLATED; CULTURE IN PROGRESS FOR 5 DAYS Performed at [facility], [address] [place], [place] [address]    Report Status PENDING  Incomplete    Radiology Studies: No results found.    Prateek Knipple T. Thamara Leger [facility]  If 7PM-7AM, please contact night-coverage [email or web address] 12/16/2022, 2:47 PM

## 2022-12-16 NOTE — Progress Notes (Signed)
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   No Known Allergies  Patient Measurements: Height: 5\' 3"  (160 cm) Weight: 49.9 kg (110 lb) IBW/kg (Calculated) : 52.4 Heparin Dosing Weight: TBW  Vital Signs: Temp: 98.2 F (36.8 C) (11/20 0534) Temp Source: Oral (11/20 0534) BP: 112/74 (11/20 0534) Pulse Rate: 102 (11/20 0534)  Labs: Recent Labs    12/14/22 0601 12/14/22 1939 12/15/22 0556 12/15/22 1144 12/15/22 2056 12/16/22 0554  HGB 12.1  --  11.3*  --   --  13.2  HCT 36.4  --  33.3*  --   --  39.9  PLT 352  --  348  --   --  459*  HEPARINUNFRC  --    < > 0.37 0.27* 0.32 0.40  CREATININE 0.58  --  0.50  --   --  0.46   < > = values in this interval not displayed.    Estimated Creatinine Clearance: 56.7 mL/min (by C-G formula based on SCr of 0.46 mg/dL).  Assessment: [name] admitted 11/12 for acute on chronic pancreatitis symptoms. Found to have a pancreatic mass concerning for malignancy. CT also reveals splenic, SMV, and portal vein thrombus. GI uncertain if this is true thrombus vs tumor compression, but for now would like Pharmacy to start IV heparin, until HemOnc can see on Monday. 11/15 heparin protocol per pharmacy ordered then cancelled for percutaneous biliary drain placement 11/18 pharmacy re-consulted to dose heparin   11/17 Baseline INR, mildly elevated; consistent with acute hepatic congestion from above processes, vitamin K 10 mg IV given 11/17 @ 0931 am  Prior anticoagulation: none PTA; on Lovenox 40 mg/d; last dose given 11/16 PM  Significant events:  11/17 percutaneous cholecystostomy tube placement.   Today, 12/16/2022: Heparin level 0.40 is therapeutic on heparin drip @ 1050 units/hr CBC: Hg WNL PLT slightly elevated SCr stable < 1.0 No bleeding per nursing  Goal of Therapy: Heparin level 0.3-0.7 units/ml Monitor platelets by  anticoagulation protocol: Yes  Plan: Continue heparin drip @ 1050 units/hr Daily CBC, daily heparin level Monitor for signs of bleeding or thrombosis  [name], Pharm.D Use secure chat for questions 12/16/2022 8:26 AM

## 2022-12-16 NOTE — Progress Notes (Signed)
Referring Physician(s): Dr. Morton Peters   Supervising Physician: Malachy Moan  Patient Status:  [facility] - In-pt  Chief Complaint:  Abdominal pain with severe gallbladder distention   Subjective:  Patient is alert, sitting up in bed. Somewhat restless night.  Patient states she feels well, and not requiring pain medications Drain more comfortable today, not itching.Peri [place] output overnight.  Allergies: Patient has no known allergies.  Medications: Prior to Admission medications   Medication Sig Start Date End Date Taking? Authorizing Provider  gabapentin (NEURONTIN) 300 MG capsule Take 2 capsules (600 mg total) by mouth 2 (two) times daily. Patient taking differently: Take 900 mg by mouth at bedtime. 10/09/17  Yes Rolan Bucco, MD  mirtazapine (REMERON) 45 MG tablet Take 45 mg by mouth at bedtime. 11/05/21  Yes [provider]  QUEtiapine (SEROQUEL) 100 MG tablet Take 100-200 mg by mouth at bedtime.   Yes [provider]  albuterol (VENTOLIN HFA) 108 (90 Base) MCG/ACT inhaler Inhale 2 puffs into the lungs every 6 (six) hours as needed for wheezing or shortness of breath. Patient not taking: Reported on 12/08/2022 08/06/21   Burnadette Pop, MD  folic acid (FOLVITE) 1 MG tablet Take 1 tablet (1 mg total) by mouth daily. Patient not taking: Reported on 12/08/2022 08/07/21   Burnadette Pop, MD  mirtazapine (REMERON) 15 MG tablet Take 1 tablet (15 mg total) by mouth at bedtime. Patient not taking: Reported on 12/08/2022 08/06/21   Burnadette Pop, MD  mometasone-formoterol (DULERA) 200-5 MCG/ACT AERO Inhale 2 puffs into the lungs 2 (two) times daily. Patient not taking: Reported on 12/08/2022 08/06/21   Burnadette Pop, MD  QUEtiapine (SEROQUEL) 25 MG tablet Take 1 tablet (25 mg total) by mouth at bedtime. Patient not taking: Reported on 12/08/2022 08/06/21   Burnadette Pop, MD  thiamine 100 MG tablet Take 1 tablet (100 mg total) by mouth daily. Patient not  taking: Reported on 09/12/2021 08/07/21   Burnadette Pop, MD     Vital Signs: BP 112/74 (BP Location: Left Arm)   Pulse (!) 102   Temp 98.2 F (36.8 C) (Oral)   Resp 18   Ht 5\' 3"  (1.6 m)   Wt 110 lb (49.9 kg)   SpO2 95%   BMI 19.49 kg/m   Physical Exam Abdominal:     Palpations: Abdomen is soft.     Comments: Drain site c/d/I. No tenderness to palpation at drain site. Bilious OP in bag. Flushes/aspirates easily.   Skin:    General: Skin is warm and dry.     Imaging: IR Perc Cholecystostomy  Addendum Date: 12/13/2022   ADDENDUM REPORT: 12/13/2022 20:18 ADDENDUM: Contrast: 5 mL Omnipaque 300 Electronically Signed   By: Richarda Overlie M.D.   On: 12/13/2022 20:18   Result Date: 12/13/2022 INDICATION: 62 year old with abdominal pain and severe gallbladder distension. New fluid around the gallbladder after upper endoscopy and EUS. Concern for a pancreatic malignancy. EXAM: IMAGE GUIDED PERCUTANEOUS CHOLECYSTOSTOMY TUBE PLACEMENT MEDICATIONS: Inpatient and receiving IV antibiotics. ANESTHESIA/SEDATION: Moderate (conscious) sedation was employed during this procedure. A total of Versed 1.5 mg and Fentanyl 50 mcg was administered intravenously by the radiology nurse. Total intra-service moderate Sedation Time: 15 minutes. The patient's level of consciousness and vital signs were monitored continuously by radiology nursing throughout the procedure under my direct supervision. FLUOROSCOPY: Radiation Exposure Index (as provided by the fluoroscopic device): 0.1 mGy Kerma COMPLICATIONS: None immediate. PROCEDURE: Informed written consent was obtained from the patient after a thorough discussion of  the procedural risks, benefits and alternatives. All questions were addressed. A timeout was performed prior to the initiation of the procedure. Patient was placed supine. The right side of the abdomen was prepped and draped in sterile fashion. Maximal barrier sterile technique was utilized including caps,  mask, sterile gowns, sterile gloves, sterile drape, hand hygiene and skin antiseptic. Ultrasound demonstrated a very distended gallbladder. The skin was anesthetized using 1% lidocaine. Using ultrasound guidance, 21 gauge needle was directed into the gallbladder and a 0.018 wire was placed. A transitional dilator set was placed. Wire was advanced into the gallbladder and the tract was dilated to accommodate a 10 [place] multipurpose drain. Approximately 200 mL of dark green fluid was removed. The gallbladder was decompressed at the end of the procedure. Contrast injection confirmed placement in the gallbladder. Drain was sutured to skin and attached to a gravity bag. Fluid was sent for culture and cytology. Fluoroscopic and ultrasound images were taken and saved for documentation. FINDINGS: Gallbladder was severely distended. Fluid and edematous tissue around the gallbladder. Gallbladder was decompressed at the end of the procedure. 200 mL of fluid was removed. IMPRESSION: Successful image guided placement of a percutaneous cholecystostomy tube. Electronically Signed: By: Richarda Overlie M.D. On: 12/13/2022 19:47   CT ABDOMEN PELVIS W CONTRAST  Result Date: 12/12/2022 CLINICAL DATA:  Acute abdominal pain,  pancreatitis, pancreatic pass EXAM: CT ABDOMEN AND PELVIS WITH CONTRAST TECHNIQUE: Multidetector CT imaging of the abdomen and pelvis was performed using the standard protocol following bolus administration of intravenous contrast. RADIATION DOSE REDUCTION: This exam was performed according to the departmental dose-optimization program which includes automated exposure control, adjustment of the mA and/or kV according to patient size and/or use of iterative reconstruction technique. CONTRAST:  OMNIPAQUE IOHEXOL 300 MG/ML  SOLN COMPARISON:  12/09/2022, 12/08/2022 FINDINGS: Lower chest: No acute pleural or parenchymal lung disease. Stable pericardial effusion. Stable distal esophageal wall thickening. Small  hiatal hernia. Hepatobiliary: Marked distension of the gallbladder is again noted, with intrahepatic duct dilation and proximal common bile duct distension, due to occlusion by the infiltrative pancreatic mass image previously. Heterogeneous appearance of the liver parenchyma within the inferior right lobe and extending along the gallbladder fossa could likely due to known portal vein thrombosis or geographic hepatic steatosis as seen on recent MRI. Multiple liver hypodensities are unchanged, previously characterized on MRI. Pancreas: The infiltrative mass centered at the pancreatic head is again noted, measuring up to 6.1 x 4.4 cm, compatible with pancreatic neoplasm based on MRI findings. Upstream pancreatic parenchymal atrophy. Decreased fluid collections surrounding the pancreas, now measuring 5.6 x 2.5 cm image 36/2 and 2.4 x 2.2 cm reference image 44/2. Spleen: Normal in size without focal abnormality. Adrenals/Urinary Tract: Stable appearance of the adrenals and kidneys. Bladder is decompressed, limiting its evaluation. Stomach/Bowel: Prominent gastric distension again noted, with functional gastric outlet obstruction due to mass effect by the infiltrative pancreatic mass described above. Likely ulceration within the gastric antrum measuring up to 1.8 cm reference image 26/5. No evidence of perforation. No evidence of bowel obstruction or ileus. Vascular/Lymphatic: Continued occlusion of the splenic vein, SMV, and proximal aspect of the portal vein again noted due to extrinsic compression by the infiltrative pancreatic mass. There is nonocclusive thrombus within the main portal vein near the bifurcation. Extensive venous collaterals are seen throughout the mesentery unchanged since prior exam. Stable aortic atherosclerosis. No discrete pathologic adenopathy identified. Numerous subcentimeter mesenteric lymph nodes adjacent to the pancreatic mass in the upper abdomen are nonspecific.  Reproductive: Calcified  uterine fibroids unchanged. No adnexal masses. Other: Free fluid surrounds the distended gallbladder within the right upper quadrant. No free intraperitoneal gas. No abdominal wall hernia. Musculoskeletal: No acute or destructive bony abnormalities. Reconstructed images demonstrate no additional findings. IMPRESSION: 1. Stable infiltrating pancreatic mass, with thrombosis of the splenic vein, SMV, and proximal portal vein. Nonocclusive thrombus within the main portal vein just proximal to the bifurcation. 2. Decreased size of the peripancreatic fluid collections as above. New simple appearing free fluid surrounding a distended gallbladder and right upper quadrant could reflect interval rupture of these peripancreatic fluid collections. 3. Continued gastric distension compatible with functional gastric outlet obstruction due to the infiltrating pancreatic mass. Suspected ulcer within the gastric antrum, without evidence of perforation. 4. Marked gallbladder distension, with intrahepatic and extrahepatic biliary duct dilation, due to downstream common bile duct obstruction due to the pancreatic mass as seen on recent MRI. No evidence of gallbladder wall thickening or calcified gallstones. 5.  Aortic Atherosclerosis (ICD10-I70.0). Electronically Signed   By: Sharlet Salina M.D.   On: 12/12/2022 17:56    Labs:  CBC: Recent Labs    12/13/22 0611 12/14/22 0601 12/15/22 0556 12/16/22 0554  WBC 14.9* 10.1 8.0 8.0  HGB 14.0 12.1 11.3* 13.2  HCT 43.0 36.4 33.3* 39.9  PLT 469* 352 348 459*    COAGS: Recent Labs    12/13/22 0611  INR 1.3*    BMP: Recent Labs    12/13/22 0611 12/14/22 0601 12/15/22 0556 12/16/22 0554  NA 133* 131* 131* 133*  K 5.0 4.3 3.5 2.9*  CL 105 103 102 99  CO2 23 22 24 26   GLUCOSE 144* 104* 104* 114*  BUN 17 15 11  5*  CALCIUM 7.7* 8.2* 7.8* 8.4*  CREATININE 0.76 0.58 0.50 0.46  GFRNONAA >60 >60 >60 >60    LIVER FUNCTION TESTS: Recent Labs    12/13/22 0611  12/14/22 0601 12/15/22 0556 12/16/22 0554  BILITOT 1.5* 1.3* 1.0 1.0  AST 67* 46* 18 20  ALT 26 36 24 22  ALKPHOS 188* 194* 183* 224*  PROT 5.2* 4.9* 4.8* 5.8*  ALBUMIN 2.4* 2.1* 2.0* 2.6*  2.6*    Assessment and Plan:   Drain Location: RUQ Size: Fr size: 10 Fr Date of placement: 11/17  Currently to: Drain collection device: gravity 24 hour output:  Output by Drain (mL) 12/14/22 0701 - 12/12/22 1900 12/14/22 1901 - 12/13/22 0700 12/15/22 0701 - 12/13/22 1900 12/15/22 1901 - 12/14/22 0700 12/16/22 0701 - 12/14/22 1105  Biliary Tube Cook slip-coat 10 Fr. RUQ  200  250  200        Interval imaging/drain manipulation:  none   Current examination: Flushes/aspirates easily.  Insertion site unremarkable. Suture and stat lock in place. Dressed appropriately.    Plan: Continue TID flushes with 5 cc NS. Record output Q shift. Dressing changes QD or PRN if soiled.  Call IR APP or on call IR MD if difficulty flushing or sudden change in drain output.  Repeat imaging/possible drain injection once output < 10 mL/QD (excluding flush material). Consideration for drain removal if output is < 10 mL/QD (excluding flush material), pending discussion with the providing surgical service.   Discharge planning: Please contact IR APP or on call IR MD prior to patient d/c to ensure appropriate follow up plans are in place. Typically patient will follow up with IR clinic 6-8 weeks post d/c for repeat imaging/possible drain injection. IR scheduler will contact patient with date/time of appointment.  Patient will need to flush drain QD with 5 cc NS, record output QD, dressing changes every 2-3 days or earlier if soiled.    IR will continue to follow - please call with questions or concerns.  Electronically Signed: Sable Feil, PA-C 12/16/2022, 8:09 AM   I spent a total of 15 Minutes at the the patient's bedside AND on the patient's hospital floor or unit, greater than 50% of which was  counseling/coordinating care for percutaneous cholecystotomy drain placement.

## 2022-12-17 ENCOUNTER — Inpatient Hospital Stay (HOSPITAL_COMMUNITY): Payer: Medicaid Other

## 2022-12-17 DIAGNOSIS — K8689 Other specified diseases of pancreas: Secondary | ICD-10-CM | POA: Diagnosis not present

## 2022-12-17 DIAGNOSIS — K860 Alcohol-induced chronic pancreatitis: Secondary | ICD-10-CM | POA: Diagnosis not present

## 2022-12-17 DIAGNOSIS — E46 Unspecified protein-calorie malnutrition: Secondary | ICD-10-CM | POA: Diagnosis not present

## 2022-12-17 DIAGNOSIS — R1319 Other dysphagia: Secondary | ICD-10-CM | POA: Diagnosis not present

## 2022-12-17 LAB — RENAL FUNCTION PANEL
Albumin: 2.4 g/dL — ABNORMAL LOW (ref 3.5–5.0)
Anion gap: 9 (ref 5–15)
BUN: <5 mg/dL — ABNORMAL LOW (ref 8–23)
CO2: 25 mmol/L (ref 22–32)
Calcium: 7.8 mg/dL — ABNORMAL LOW (ref 8.9–10.3)
Chloride: 97 mmol/L — ABNORMAL LOW (ref 98–111)
Creatinine, Ser: 0.37 mg/dL — ABNORMAL LOW (ref 0.44–1.00)
GFR, Estimated: >60 mL/min (ref >60)
Glucose, Bld: 113 mg/dL — ABNORMAL HIGH (ref 70–99)
Phosphorus: 2.9 mg/dL (ref 2.5–4.6)
Potassium: 3.6 mmol/L (ref 3.5–5.1)
Sodium: 131 mmol/L — ABNORMAL LOW (ref 135–145)

## 2022-12-17 LAB — CBC
HCT: 33.2 % — ABNORMAL LOW (ref 36.0–46.0)
Hemoglobin: 11.1 g/dL — ABNORMAL LOW (ref 12.0–15.0)
MCH: 33.2 pg (ref 26.0–34.0)
MCHC: 33.4 g/dL (ref 30.0–36.0)
MCV: 99.4 fL (ref 80.0–100.0)
Platelets: 394 10*3/uL (ref 150–400)
RBC: 3.34 MIL/uL — ABNORMAL LOW (ref 3.87–5.11)
RDW: 12.9 % (ref 11.5–15.5)
WBC: 10.5 10*3/uL (ref 4.0–10.5)
nRBC: 0 % (ref 0.0–0.2)

## 2022-12-17 LAB — GLUCOSE, CAPILLARY
Glucose-Capillary: 122 mg/dL — ABNORMAL HIGH (ref 70–99)
Glucose-Capillary: 125 mg/dL — ABNORMAL HIGH (ref 70–99)
Glucose-Capillary: 126 mg/dL — ABNORMAL HIGH (ref 70–99)
Glucose-Capillary: 133 mg/dL — ABNORMAL HIGH (ref 70–99)

## 2022-12-17 LAB — HEPATIC FUNCTION PANEL
ALT: 14 U/L (ref 0–44)
AST: 12 U/L — ABNORMAL LOW (ref 15–41)
Albumin: 2.3 g/dL — ABNORMAL LOW (ref 3.5–5.0)
Alkaline Phosphatase: 173 U/L — ABNORMAL HIGH (ref 38–126)
Bilirubin, Direct: 0.2 mg/dL (ref 0.0–0.2)
Indirect Bilirubin: 0.7 mg/dL (ref 0.3–0.9)
Total Bilirubin: 0.9 mg/dL (ref <1.2)
Total Protein: 5 g/dL — ABNORMAL LOW (ref 6.5–8.1)

## 2022-12-17 LAB — HEPARIN LEVEL (UNFRACTIONATED)
Heparin Unfractionated: 0.25 [IU]/mL — ABNORMAL LOW (ref 0.30–0.70)
Heparin Unfractionated: 0.27 [IU]/mL — ABNORMAL LOW (ref 0.30–0.70)
Heparin Unfractionated: 0.63 [IU]/mL (ref 0.30–0.70)

## 2022-12-17 LAB — LIPASE, BLOOD: Lipase: 31 U/L (ref 11–51)

## 2022-12-17 LAB — MAGNESIUM: Magnesium: 1.8 mg/dL (ref 1.7–2.4)

## 2022-12-17 MED ORDER — GUAIFENESIN-DM 100-10 MG/5ML PO SYRP
5.0000 mL | ORAL_SOLUTION | ORAL | Status: DC | PRN
  Administered 2022-12-17: 5 mL via ORAL
  Filled 2022-12-17: qty 5

## 2022-12-17 MED ORDER — PANTOPRAZOLE SODIUM 40 MG PO TBEC
40.0000 mg | DELAYED_RELEASE_TABLET | Freq: Two times a day (BID) | ORAL | Status: DC
  Administered 2022-12-17 – 2022-12-18 (×2): 40 mg via ORAL
  Filled 2022-12-17 (×2): qty 1

## 2022-12-17 MED ORDER — LORATADINE 10 MG PO TABS
10.0000 mg | ORAL_TABLET | Freq: Every day | ORAL | Status: DC | PRN
  Administered 2022-12-17: 10 mg via ORAL
  Filled 2022-12-17: qty 1

## 2022-12-17 MED ORDER — HEPARIN (PORCINE) 25000 UT/250ML-% IV SOLN: 1300.0000 [IU]/h | INTRAVENOUS | Status: DC

## 2022-12-17 NOTE — Progress Notes (Signed)
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   No Known Allergies  Patient Measurements: Height: 5\' 3"  (160 cm) Weight: 49.9 kg (110 lb) IBW/kg (Calculated) : 52.4 Heparin Dosing Weight: TBW  Vital Signs: Temp: 98.4 F (36.9 C) (11/21 1326) Temp Source: Oral (11/21 1326) BP: 124/73 (11/21 1326) Pulse Rate: 95 (11/21 1326)  Labs: Recent Labs    12/15/22 0556 12/15/22 1144 12/16/22 0554 12/17/22 0655 12/17/22 0657 12/17/22 1556  HGB 11.3*  --  13.2 11.1*  --   --   HCT 33.3*  --  39.9 33.2*  --   --   PLT 348  --  459* 394  --   --   HEPARINUNFRC 0.37   < > 0.40 0.25*  --  0.27*  CREATININE 0.50  --  0.46  --  0.37*  --    < > = values in this interval not displayed.    Estimated Creatinine Clearance: 56.7 mL/min (A) (by C-G formula based on SCr of 0.37 mg/dL (L)).  Assessment: 62 yo female admitted 11/12 for acute on chronic pancreatitis symptoms. Found to have a pancreatic mass concerning for malignancy. CT also reveals splenic, SMV, and portal vein thrombus. GI uncertain if this is true thrombus vs tumor compression, but for now would like Pharmacy to start IV heparin, until HemOnc can see on Monday. 11/15 heparin protocol per pharmacy ordered then cancelled for percutaneous biliary drain placement 11/18 pharmacy re-consulted to dose heparin   11/17 Baseline INR, mildly elevated; consistent with acute hepatic congestion from above processes, vitamin K 10 mg IV given 11/17 @ 0931 am  Prior anticoagulation: none PTA; on Lovenox 40 mg/d; last dose given 11/16 PM  Significant events:  11/17 percutaneous cholecystostomy tube placement.   Today, 12/17/2022, PM:  Heparin level still SUBtherapeutic but improving after heparin increased from 1050 to 1150 units/hr this morning Per RN, no bleeding or issues  Goal of Therapy: Heparin level 0.3-0.7  units/ml Monitor platelets by anticoagulation protocol: Yes  Plan: Increase IV heparin from 1150 to 1300 units/hr Recheck heparin level 6 hours after rate increase Daily CBC, daily heparin level Monitor for signs of bleeding or thrombosis   Hessie Knows, PharmD, BCPS Secure Chat if ?s 12/17/2022 4:32 PM

## 2022-12-17 NOTE — Progress Notes (Signed)
Referring Physician(s): Feng,Y  Supervising Physician: Roanna Banning  Patient Status:  [facility] - In-pt  Chief Complaint:  Pancreatic cancer  Subjective: Pt doing ok today; denies fever,HA,CP,dyspnea, abd pain,N/V or bleeding; does have some back pain; daughter in room   Past Medical History:  Diagnosis Date   Alcohol abuse    Anxiety    Chickenpox    Depression    Neuromuscular disorder (HCC)    neuropathy   Substance abuse (HCC)    Past Surgical History:  Procedure Laterality Date   BIOPSY  12/10/2022   Procedure: BIOPSY;  Surgeon: Meridee Score Netty Starring., MD;  Location: [facility];  Service: Gastroenterology;;   CESAREAN SECTION     ESOPHAGOGASTRODUODENOSCOPY N/A 08/14/2021   Procedure: ESOPHAGOGASTRODUODENOSCOPY (EGD);  Surgeon: Rachael Fee, MD;  Location: [facility];  Service: Gastroenterology;  Laterality: N/A;   ESOPHAGOGASTRODUODENOSCOPY N/A 12/12/2022   Procedure: ESOPHAGOGASTRODUODENOSCOPY (EGD);  Surgeon: Lemar Lofty., MD;  Location: [facility];  Service: Gastroenterology;  Laterality: N/A;   ESOPHAGOGASTRODUODENOSCOPY (EGD) WITH PROPOFOL N/A 12/10/2022   Procedure: ESOPHAGOGASTRODUODENOSCOPY (EGD) WITH PROPOFOL;  Surgeon: Meridee Score Netty Starring., MD;  Location: [facility];  Service: Gastroenterology;  Laterality: N/A;   EUS N/A 08/14/2021   Procedure: UPPER ENDOSCOPIC ULTRASOUND (EUS) RADIAL;  Surgeon: Rachael Fee, MD;  Location: [facility];  Service: Gastroenterology;  Laterality: N/A;   EUS N/A 12/12/2022   Procedure: UPPER ENDOSCOPIC ULTRASOUND (EUS) LINEAR;  Surgeon: Lemar Lofty., MD;  Location: [facility];  Service: Gastroenterology;  Laterality: N/A;   FINE NEEDLE ASPIRATION N/A 08/14/2021   Procedure: FINE NEEDLE ASPIRATION (FNA) LINEAR;  Surgeon: Rachael Fee, MD;  Location: [facility];  Service: Gastroenterology;  Laterality: N/A;   FINE NEEDLE ASPIRATION N/A 12/12/2022   Procedure: FINE NEEDLE ASPIRATION  (FNA) LINEAR;  Surgeon: Lemar Lofty., MD;  Location: [facility];  Service: Gastroenterology;  Laterality: N/A;   IR PERC CHOLECYSTOSTOMY  12/13/2022   TONSILLECTOMY        Allergies: Patient has no known allergies.  Medications: Prior to Admission medications   Medication Sig Start Date End Date Taking? Authorizing Provider  gabapentin (NEURONTIN) 300 MG capsule Take 2 capsules (600 mg total) by mouth 2 (two) times daily. Patient taking differently: Take 900 mg by mouth at bedtime. 10/09/17  Yes Rolan Bucco, MD  mirtazapine (REMERON) 45 MG tablet Take 45 mg by mouth at bedtime. 11/05/21  Yes [provider]  QUEtiapine (SEROQUEL) 100 MG tablet Take 100-200 mg by mouth at bedtime.   Yes [provider]  albuterol (VENTOLIN HFA) 108 (90 Base) MCG/ACT inhaler Inhale 2 puffs into the lungs every 6 (six) hours as needed for wheezing or shortness of breath. Patient not taking: Reported on 12/08/2022 08/06/21   Burnadette Pop, MD  folic acid (FOLVITE) 1 MG tablet Take 1 tablet (1 mg total) by mouth daily. Patient not taking: Reported on 12/08/2022 08/07/21   Burnadette Pop, MD  mirtazapine (REMERON) 15 MG tablet Take 1 tablet (15 mg total) by mouth at bedtime. Patient not taking: Reported on 12/08/2022 08/06/21   Burnadette Pop, MD  mometasone-formoterol (DULERA) 200-5 MCG/ACT AERO Inhale 2 puffs into the lungs 2 (two) times daily. Patient not taking: Reported on 12/08/2022 08/06/21   Burnadette Pop, MD  QUEtiapine (SEROQUEL) 25 MG tablet Take 1 tablet (25 mg total) by mouth at bedtime. Patient not taking: Reported on 12/08/2022 08/06/21   Burnadette Pop, MD  thiamine 100 MG tablet Take 1 tablet (100 mg total) by mouth  daily. Patient not taking: Reported on 09/12/2021 08/07/21   Burnadette Pop, MD     Vital Signs: BP 137/86 (BP Location: Left Arm)   Pulse (!) 103   Temp 98.5 F (36.9 C) (Oral)   Resp 16   Ht 5\' 3"  (1.6 m)   Wt 110 lb (49.9 kg)   SpO2  96%   BMI 19.49 kg/m   Physical Exam: awake/alert; thin WF in NAD; chest- CTA bilat; heart- sl tachy but reg rhythm; abd-soft,+BS,NT; no sig LE edema; GB drain intact, insertion site ok, output 150 cc green bile, drain flushed without difficulty  Imaging: IR Perc Cholecystostomy  Addendum Date: 12/13/2022   ADDENDUM REPORT: 12/13/2022 20:18 ADDENDUM: Contrast: 5 mL Omnipaque 300 Electronically Signed   By: Richarda Overlie M.D.   On: 12/13/2022 20:18   Result Date: 12/13/2022 INDICATION: 62 year old with abdominal pain and severe gallbladder distension. New fluid around the gallbladder after upper endoscopy and EUS. Concern for a pancreatic malignancy. EXAM: IMAGE GUIDED PERCUTANEOUS CHOLECYSTOSTOMY TUBE PLACEMENT MEDICATIONS: Inpatient and receiving IV antibiotics. ANESTHESIA/SEDATION: Moderate (conscious) sedation was employed during this procedure. A total of Versed 1.5 mg and Fentanyl 50 mcg was administered intravenously by the radiology nurse. Total intra-service moderate Sedation Time: 15 minutes. The patient's level of consciousness and vital signs were monitored continuously by radiology nursing throughout the procedure under my direct supervision. FLUOROSCOPY: Radiation Exposure Index (as provided by the fluoroscopic device): 0.1 mGy Kerma COMPLICATIONS: None immediate. PROCEDURE: Informed written consent was obtained from the patient after a thorough discussion of the procedural risks, benefits and alternatives. All questions were addressed. A timeout was performed prior to the initiation of the procedure. Patient was placed supine. The right side of the abdomen was prepped and draped in sterile fashion. Maximal barrier sterile technique was utilized including caps, mask, sterile gowns, sterile gloves, sterile drape, hand hygiene and skin antiseptic. Ultrasound demonstrated a very distended gallbladder. The skin was anesthetized using 1% lidocaine. Using ultrasound guidance, 21 gauge needle was  directed into the gallbladder and a 0.018 wire was placed. A transitional dilator set was placed. Wire was advanced into the gallbladder and the tract was dilated to accommodate a 10 [place] multipurpose drain. Approximately 200 mL of dark green fluid was removed. The gallbladder was decompressed at the end of the procedure. Contrast injection confirmed placement in the gallbladder. Drain was sutured to skin and attached to a gravity bag. Fluid was sent for culture and cytology. Fluoroscopic and ultrasound images were taken and saved for documentation. FINDINGS: Gallbladder was severely distended. Fluid and edematous tissue around the gallbladder. Gallbladder was decompressed at the end of the procedure. 200 mL of fluid was removed. IMPRESSION: Successful image guided placement of a percutaneous cholecystostomy tube. Electronically Signed: By: Richarda Overlie M.D. On: 12/13/2022 19:47    Labs:  CBC: Recent Labs    12/14/22 0601 12/15/22 0556 12/16/22 0554 12/17/22 0655  WBC 10.1 8.0 8.0 10.5  HGB 12.1 11.3* 13.2 11.1*  HCT 36.4 33.3* 39.9 33.2*  PLT 352 348 459* 394    COAGS: Recent Labs    12/13/22 0611  INR 1.3*    BMP: Recent Labs    12/14/22 0601 12/15/22 0556 12/16/22 0554 12/17/22 0657  NA 131* 131* 133* 131*  K 4.3 3.5 2.9* 3.6  CL 103 102 99 97*  CO2 22 24 26 25   GLUCOSE 104* 104* 114* 113*  BUN 15 11 5* <5*  CALCIUM 8.2* 7.8* 8.4* 7.8*  CREATININE 0.58 0.50 0.46  0.37*  GFRNONAA >60 >60 >60 >60    LIVER FUNCTION TESTS: Recent Labs    12/14/22 0601 12/15/22 0556 12/16/22 0554 12/17/22 0655 12/17/22 0657  BILITOT 1.3* 1.0 1.0 0.9  --   AST 46* 18 20 12*  --   ALT 36 24 22 14   --   ALKPHOS 194* 183* 224* 173*  --   PROT 4.9* 4.8* 5.8* 5.0*  --   ALBUMIN 2.1* 2.0* 2.6*  2.6* 2.3* 2.4*    Assessment and Plan: 62 yo female with PMH Etoh abuse, pancreatitis with pseudocyst, neuropathy, anxiety/depression, GERD, tobacco abuse and now with newly diagnosed  pancreatic cancer with bil obst/ partial GOO/gallbladder distension/ portal/SMV/splenic vein thrombosis- on IV heparin, malnutrition; s/p GB drain placement on 12/13/22; afebrile, WBC nl, hgb 11.1, plts nl; creat 0.37; t bili 0.9; bile cx neg to date; cont drain irrigation; ? consider biliary drain/biliary stent placement when cholecystostomy tube is removed ; request received now for port a cath placement; Risks and benefits of image guided port-a-catheter placement was discussed with the patient/family including, but not limited to bleeding, infection, pneumothorax, or fibrin sheath development and need for additional procedures.  All of the patient's questions were answered, patient is agreeable to proceed. Consent signed and in chart.  Will plan port placement as schedule allows  Electronically Signed: D. Jeananne Rama, PA-C 12/17/2022, 11:29 AM   I spent a total of 25 Minutes at the the patient's bedside AND on the patient's hospital floor or unit, greater than 50% of which was counseling/coordinating care for gallbladder drain;  port a cath placement    Patient ID: Kathryn Underwood, female   DOB: 05/16/1960, 62 y.o.   MRN: [id number]

## 2022-12-17 NOTE — Progress Notes (Signed)
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    Estimated Creatinine Clearance: 56.7 mL/min (by C-G formula based on SCr of 0.46 mg/dL).  Assessment: 62 yo female admitted 11/12 for acute on chronic pancreatitis symptoms. Found to have a pancreatic mass concerning for malignancy. CT also reveals splenic, SMV, and portal vein thrombus. GI uncertain if this is true thrombus vs tumor compression, but for now would like Pharmacy to start IV heparin, until HemOnc can see on Monday. 11/15 heparin protocol per pharmacy ordered then cancelled for percutaneous biliary drain placement 11/18 pharmacy re-consulted to dose heparin   11/17 Baseline INR, mildly elevated; consistent with acute hepatic congestion from above processes, vitamin K 10 mg IV given 11/17 @ 0931 am  Prior anticoagulation: none PTA; on Lovenox 40 mg/d; last dose given 11/16 PM  Significant events:  11/17 percutaneous cholecystostomy tube placement.   Today, 12/17/2022: Heparin level 0.25 is SUBtherapeutic on heparin drip @ 1050 units/hr CBC: Hg trending down 13.2>11.1 PLT WNL SCr stable 0.46 No bleeding per RN   Goal of Therapy: Heparin level 0.3-0.7 units/ml Monitor platelets by anticoagulation protocol:  Yes  Plan: Increase heparin drip to 1150 units/hr 6-hour heparin level after rate change Daily CBC, daily heparin level Monitor for signs of bleeding or thrombosis  [employer].D Candidate 12/17/2022 7:53 AM

## 2022-12-17 NOTE — Plan of Care (Signed)
  Problem: Education: Goal: Knowledge of General Education information will improve Description: Including pain rating scale, medication(s)/side effects and non-pharmacologic comfort measures Outcome: Progressing   Problem: Clinical Measurements: Goal: Diagnostic test results will improve Outcome: Progressing   Problem: Nutrition: Goal: Adequate nutrition will be maintained Outcome: Progressing   

## 2022-12-17 NOTE — Progress Notes (Signed)
The gauze dressing around the drain site was changed by this nurse d/t saturation of a serous/yellow drainage. Dressing at this time C/D/I

## 2022-12-17 NOTE — Plan of Care (Signed)

## 2022-12-17 NOTE — Progress Notes (Signed)
PROGRESS NOTE  Kathryn Underwood WUJ:[id number] DOB: 1960/05/08   PCP: Bary Leriche, PA-C  Patient is from: Home.  DOA: 12/08/2022 LOS: 9  Chief complaints Chief Complaint  Patient presents with   Weight Loss   Weakness     Brief Narrative / Interim history: 62 year old F with PMH of EtOH pancreatitis with pseudocyst, alcohol use disorder, neuropathy, anxiety, depression, GERD and tobacco use disorder presenting with decreased oral intake, fatigue and unintentional weight loss for about 2 months and found to have pancreatic mass on imaging.  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  EUS not successful on 11/14 due to foodstuff in stomach.  Patient underwent EGD/EUS on 11/16.  After procedure, she has significant abdominal pain for which she received IV fentanyl twice while in PACU.  Patient continued to have significant right-sided abdominal pain despite pain medications.  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  IR consulted and patient underwent percutaneous cholecystostomy on 11/17.    Pancreatic biopsy confirmed adenocarcinoma.  Oncology consulted and recommended CT chest for staging, port a cath placement and outpatient follow-up for treatment.  Subjective: Seen and examined earlier this morning.  Reports rough night after choking episode in the evening.  She denies nausea or vomiting.  Having bowel movements.  Feels better this morning.  Objective: Vitals:   12/16/22 0534 12/16/22 1401 12/16/22 2146 12/17/22 1326  BP: 112/74 (!) 137/95 137/86 124/73  Pulse: (!) 102 99 (!) 103 95  Resp: 18 20 16 20   Temp: 98.2 F (36.8 C) 98.5 F (36.9  C) 98.5 F (36.9 C) 98.4 F (36.9 C)  TempSrc: Oral Oral Oral Oral  SpO2: 95% 98% 96% 95%  Weight:      Height:        Examination:  GENERAL: No apparent distress.  Nontoxic. HEENT: MMM.  Vision and hearing grossly intact.  NECK: Supple.  No apparent JVD.  RESP:  No IWOB.  Fair aeration bilaterally. CVS:  RRR. Heart sounds normal.  ABD/GI/GU: BS+. Abd soft.  Mild RUQ tenderness.  Drain in place. MSK/EXT:  Moves extremities. No apparent deformity. No edema.  SKIN: no apparent skin lesion or wound NEURO: Awake, alert and oriented appropriately.  No apparent focal neuro deficit. PSYCH: Calm.  No apparent distress.  Procedures:  11/14-EUS not successful due to food stuff in his stomach.  Pathology suggested esophagitis 11/16-EGD/EUS.  Pathology confirmed adenocarcinoma 11/17-percutaneous cholecystostomy tube  Microbiology summarized: None  Assessment and plan: Pancreatic cancer: Patient presented with unintentional weight loss, poor p.o. intake and fatigue.  Initial CT and MRCP concerning for large locally aggressive pancreatic neoplasm/adenocarcinoma with vascular and biliary involvement.  EGD/EUS on 11/16.  Pathology confirmed pancreatic adenocarcinoma.  CT chest without significant finding or intrathoracic metastasis. -Appreciate input by GI and oncology -Plan for port a cath placement and outpatient follow-up for chemotherapy.  Gastric outlet obstruction/gallbladder distention due to pancreatic head cancer: Significant abdominal pain after EUS on 11/16.  CT showed GOO, GB distention, biliary ductal dilation and venous thrombosis.  Cholecystostomy drain placed by IR on 11/17.  Pain, LFT and bilirubin improved. -Ceftriaxone and Flagyl 11/16 through 11/20 -GI recommended continuing full liquid diet, and not advancing until outpatient follow-up. -Continue IV Protonix and p.o. Carafate -Monitor CMP  and CBC.  Portal vein/superior mesenteric/splenic vein thrombosis: Noted on CT on  11/16.  Unclear if this is blood clot or infiltrating malignancy. -Continue IV heparin.  Will transition to DOAC on discharge.   Hx alcoholic pancreatitis with pseudocyst: CT abdomen and pelvis as above.  Patient without significant abdominal pain, nausea or vomiting.  No significant elevation of lipase or LFT.  CT and MRI as above.  Elevated AST/ALP/hyperbilirubinemia: Mild.  Improved after cholecystostomy tube. -Continue monitoring  Esophagitis: Noted on pathology from EGD on 11/14 -PPI and Carafate  Hypokalemia/hypomagnesemia: -Monitor replenish as appropriate  Metabolic alkalosis: Likely from GI loss.  Resolved.   Anxiety and depression: Understandably overwhelmed. 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 -Counseled on smoking cessation -Not interested in nicotine patch   Emphysema: No respiratory symptoms -Encouraged smoking cessation -As needed DuoNebs  Severe malnutrition.  Likely due to #1 Body mass index is 19.49 kg/m. Nutrition Problem: Severe Malnutrition Etiology: chronic illness (pancreatitis) Signs/Symptoms: energy intake < or equal to 75% for > or equal to 1 month, severe fat depletion, moderate muscle depletion, percent weight loss Interventions: Boost Breeze, MVI, Education   DVT prophylaxis:  On full dose anticoagulation  Code Status: Full code Family Communication: None at bedside Level of care: Med-Surg Status is: Inpatient Remains inpatient appropriate because: Pancreatic cancer, GOO, venous thrombosis and electrolyte derangement   Final disposition: Likely home on 11/22 Consultants:  Gastroenterology Interventional radiology Oncology  55 minutes with more than 50% spent in reviewing records, counseling patient/family and coordinating care.   Sch Meds:  Scheduled Meds:  gabapentin  900 mg Oral QHS   mirtazapine  45 mg  Oral QHS   multivitamin with minerals  1 tablet Oral Daily   pantoprazole  40 mg Oral BID   Ensure Max Protein  11 oz Oral BID   QUEtiapine  100-200 mg Oral QHS   sucralfate  1 g Oral TID WC & HS   Continuous Infusions:  heparin 1,150 Units/hr (12/17/22 1355)    PRN Meds:.acetaminophen **OR** acetaminophen, alum & mag hydroxide-simeth, bisacodyl, guaiFENesin-dextromethorphan, HYDROmorphone (DILAUDID) injection, ipratropium-albuterol, loratadine, ondansetron **OR** ondansetron (ZOFRAN) IV, oxyCODONE  Antimicrobials: Anti-infectives (From admission, onward)    Start     Dose/Rate Route Frequency Ordered Stop   12/12/22 2000  ciprofloxacin (CIPRO) tablet 500 mg  Status:  Discontinued        500 mg Oral 2 times daily 12/12/22 1006 12/12/22 1809   12/12/22 1830  cefTRIAXone (ROCEPHIN) 2 g in sodium chloride 0.9 % 100 mL IVPB        2 g 200 mL/hr over 30 Minutes Intravenous Every 24 hours 12/12/22 1809 12/16/22 1756   12/12/22 1830  metroNIDAZOLE (FLAGYL) IVPB 500 mg        500 mg 100 mL/hr over 60 Minutes Intravenous Every 12 hours 12/12/22 1809 12/17/22 0556        I have personally reviewed the following labs and images: CBC: Recent Labs  Lab 12/13/22 0611 12/14/22 0601 12/15/22 0556 12/16/22 0554 12/17/22 0655  WBC 14.9* 10.1 8.0 8.0 10.5  HGB 14.0 12.1 11.3* 13.2 11.1*  HCT 43.0 36.4 33.3* 39.9 33.2*  MCV 102.4* 102.0* 99.7 100.0 99.4  PLT 469* 352 348 459* 394   BMP &GFR Recent Labs  Lab 12/13/22 0611 12/14/22 0601 12/15/22 0556 12/16/22 0554 12/17/22 0655 12/17/22 0657  NA 133* 131* 131* 133*  --  131*  K 5.0 4.3 3.5 2.9*  --  3.6  CL 105 103 102 99  --  97*  CO2 23 22 24 26   --  25  GLUCOSE 144* 104* 104* 114*  --  113*  BUN 17 15 11  5*  --  <5*  CREATININE 0.76 0.58 0.50 0.46  --  0.37*  CALCIUM 7.7* 8.2* 7.8* 8.4*  --  7.8*  MG 3.3* 2.5* 2.2 2.0 1.8  --   PHOS  --  1.6*  --  2.6  --  2.9   Estimated Creatinine Clearance: 56.7 mL/min (A) (by C-G  formula based on SCr of 0.37 mg/dL (L)). Liver & Pancreas: Recent Labs  Lab 12/13/22 0611 12/14/22 0601 12/15/22 0556 12/16/22 0554 12/17/22 0655 12/17/22 0657  AST 67* 46* 18 20 12*  --   ALT 26 36 24 22 14   --   ALKPHOS 188* 194* 183* 224* 173*  --   BILITOT 1.5* 1.3* 1.0 1.0 0.9  --   PROT 5.2* 4.9* 4.8* 5.8* 5.0*  --   ALBUMIN 2.4* 2.1* 2.0* 2.6*  2.6* 2.3* 2.4*   Recent Labs  Lab 12/11/22 1940 12/12/22 1851 12/13/22 0611 12/17/22 0655  LIPASE 36 31 29 31    No results for input(s): "AMMONIA" in the last 168 hours. Diabetic: No results for input(s): "HGBA1C" in the last 72 hours. Recent Labs  Lab 12/16/22 1151 12/16/22 1755 12/17/22 0002 12/17/22 0639 12/17/22 1159  GLUCAP 127* 124* 125* 122* 133*   Cardiac Enzymes: No results for input(s): "CKTOTAL", "CKMB", "CKMBINDEX", "TROPONINI" in the last 168 hours. No results for input(s): "PROBNP" in the last 8760 hours. Coagulation Profile: Recent Labs  Lab 12/13/22 0611  INR 1.3*   Thyroid Function Tests: No results for input(s): "TSH", "T4TOTAL", "FREET4", "T3FREE", "THYROIDAB" in the last 72 hours. Lipid Profile: No results for input(s): "CHOL", "HDL", "LDLCALC", "TRIG", "CHOLHDL", "LDLDIRECT" in the last 72 hours. Anemia Panel: No results for input(s): "VITAMINB12", "FOLATE", "FERRITIN", "TIBC", "IRON", "RETICCTPCT" in the last 72 hours. Urine analysis:    Component Value Date/Time   COLORURINE YELLOW 07/24/2021 0827   APPEARANCEUR HAZY (A) 07/24/2021 0827   LABSPEC 1.019 07/24/2021 0827   PHURINE 5.0 07/24/2021 0827   GLUCOSEU NEGATIVE 07/24/2021 0827   HGBUR NEGATIVE 07/24/2021 0827   BILIRUBINUR NEGATIVE 07/24/2021 0827   KETONESUR NEGATIVE 07/24/2021 0827   PROTEINUR TRACE (A) 07/24/2021 0827   UROBILINOGEN 0.2 01/23/2010 1750   NITRITE NEGATIVE 07/24/2021 0827   LEUKOCYTESUR NEGATIVE 07/24/2021 0827   Sepsis Labs: Invalid input(s): "PROCALCITONIN", "LACTICIDVEN"  Microbiology: Recent  Results (from the past 240 hour(s))  Aerobic/Anaerobic Culture w Gram Stain (surgical/deep wound)     Status: None (Preliminary result)   Collection Time: 12/13/22  5:00 PM   Specimen: BILE  Result Value Ref Range Status   Specimen Description   Final    BILE Performed at [facility], [address], [place], [place] [address]    Special Requests   Final    NONE Performed at [facility], [address] [place], [place] [address]    Gram Stain NO WBC SEEN NO ORGANISMS SEEN   Final   Culture   Final    NO GROWTH 4 DAYS NO ANAEROBES ISOLATED; CULTURE IN PROGRESS FOR 5 DAYS Performed at [facility], [address] [place], [place] [address]    Report Status PENDING  Incomplete    Radiology Studies: CT CHEST WO CONTRAST  Result Date:  12/17/2022 CLINICAL DATA:  Staging for recently diagnosed pancreatic cancer. EXAM: CT CHEST WITHOUT CONTRAST TECHNIQUE: Multidetector CT imaging of the chest was performed following the standard protocol without IV contrast. RADIATION DOSE REDUCTION: This exam was performed according to the departmental dose-optimization program which includes automated exposure control, adjustment of the mA and/or kV according to patient size and/or use of iterative reconstruction technique. COMPARISON:  Chest, abdomen and pelvis CT dated 12/08/2022 and abdomen and pelvis CT dated 12/12/2022. Abdomen MRI dated 12/09/2022. Abdomen and pelvis CT dated 07/27/2021. Chest CT dated 07/24/2021. FINDINGS: Cardiovascular: Atheromatous calcifications, including the coronary arteries and aorta. Normal sized heart. Small pericardial effusion with an interval decrease in size with a current maximum thickness of 1.1 cm. Mediastinum/Nodes: No enlarged mediastinal or axillary lymph nodes. Thyroid gland and trachea are unremarkable. Previously demonstrated diffuse low-density wall thickening involving the distal esophagus. Lungs/Pleura: Stable small  area of ground-glass opacity in the medial left upper lobe. This remains less prominent than seen initially on 07/24/2021 suggesting an infectious or inflammatory process. Mild bilateral linear atelectasis and scarring. No solid lung nodules or pleural fluid. Upper Abdomen: The previously described large pancreatic head mass is partially included and unchanged. Again demonstrated is somewhat patchy, confluent low density in the liver on the right felt to represent a flow anomaly on previous studies. The previously demonstrated portal vein thrombus is not visible without intravenous contrast today. Multiple small liver cysts are unchanged. Musculoskeletal: Lower cervical spine degenerative changes and minimal thoracic spine degenerative changes. No evidence of bony metastatic disease. IMPRESSION: 1. No evidence of metastatic disease in the chest. 2. Stable small area of ground-glass opacity in the medial left upper lobe, likely an infectious or inflammatory process. 3. Small pericardial effusion with an interval decrease in size. 4. Previously demonstrated diffuse low-density wall thickening involving the distal esophagus. This most likely due to reflux esophagitis. 5. Calcific coronary artery and aortic atherosclerosis. 6. Stable recently described pancreatic head mass and probable perfusion abnormality in the liver. Without the previous studies for comparison, metastatic disease/direct spread of tumor in the liver could have a similar appearance. Aortic Atherosclerosis (ICD10-I70.0). Electronically Signed   By: Beckie Salts M.D.   On: 12/17/2022 11:43      Azaria Stegman T. Lynora Dymond [facility]  If 7PM-7AM, please contact night-coverage [email or web address] 12/17/2022, 2:49 PM

## 2022-12-18 ENCOUNTER — Other Ambulatory Visit (HOSPITAL_COMMUNITY): Payer: Self-pay

## 2022-12-18 ENCOUNTER — Inpatient Hospital Stay (HOSPITAL_COMMUNITY): Payer: Medicaid Other

## 2022-12-18 DIAGNOSIS — R933 Abnormal findings on diagnostic imaging of other parts of digestive tract: Secondary | ICD-10-CM | POA: Diagnosis not present

## 2022-12-18 DIAGNOSIS — K209 Esophagitis, unspecified without bleeding: Secondary | ICD-10-CM | POA: Diagnosis not present

## 2022-12-18 DIAGNOSIS — K8689 Other specified diseases of pancreas: Secondary | ICD-10-CM | POA: Diagnosis not present

## 2022-12-18 DIAGNOSIS — C259 Malignant neoplasm of pancreas, unspecified: Secondary | ICD-10-CM

## 2022-12-18 DIAGNOSIS — K860 Alcohol-induced chronic pancreatitis: Secondary | ICD-10-CM | POA: Diagnosis not present

## 2022-12-18 DIAGNOSIS — K862 Cyst of pancreas: Secondary | ICD-10-CM

## 2022-12-18 HISTORY — PX: IR IMAGING GUIDED PORT INSERTION: IMG5740

## 2022-12-18 LAB — AEROBIC/ANAEROBIC CULTURE W GRAM STAIN (SURGICAL/DEEP WOUND)
Culture: NO GROWTH
Gram Stain: NONE SEEN

## 2022-12-18 LAB — CBC
HCT: 33.4 % — ABNORMAL LOW (ref 36.0–46.0)
Hemoglobin: 11.6 g/dL — ABNORMAL LOW (ref 12.0–15.0)
MCH: 33.4 pg (ref 26.0–34.0)
MCHC: 34.7 g/dL (ref 30.0–36.0)
MCV: 96.3 fL (ref 80.0–100.0)
Platelets: 397 10*3/uL (ref 150–400)
RBC: 3.47 MIL/uL — ABNORMAL LOW (ref 3.87–5.11)
RDW: 13 % (ref 11.5–15.5)
WBC: 10.5 10*3/uL (ref 4.0–10.5)
nRBC: 0 % (ref 0.0–0.2)

## 2022-12-18 LAB — GLUCOSE, CAPILLARY
Glucose-Capillary: 116 mg/dL — ABNORMAL HIGH (ref 70–99)
Glucose-Capillary: 94 mg/dL (ref 70–99)
Glucose-Capillary: 99 mg/dL (ref 70–99)

## 2022-12-18 LAB — HEPARIN LEVEL (UNFRACTIONATED): Heparin Unfractionated: 0.6 [IU]/mL (ref 0.30–0.70)

## 2022-12-18 MED ORDER — VALACYCLOVIR HCL 1 G PO TABS: 1000.0000 mg | ORAL_TABLET | Freq: Two times a day (BID) | ORAL | 0 refills | Status: DC

## 2022-12-18 MED ORDER — PANTOPRAZOLE SODIUM 40 MG PO TBEC: 40.0000 mg | DELAYED_RELEASE_TABLET | Freq: Two times a day (BID) | ORAL | 0 refills | Status: DC

## 2022-12-18 MED ORDER — APIXABAN 5 MG PO TABS
ORAL_TABLET | ORAL | 0 refills | Status: DC
  Filled 2022-12-18: qty 60, 23d supply, fill #0

## 2022-12-18 MED ORDER — ADULT MULTIVITAMIN W/MINERALS CH: 1.0000 | ORAL_TABLET | Freq: Every day | ORAL | Status: DC

## 2022-12-18 MED ORDER — APIXABAN 5 MG PO TABS
10.0000 mg | ORAL_TABLET | Freq: Two times a day (BID) | ORAL | Status: DC
  Administered 2022-12-18: 10 mg via ORAL
  Filled 2022-12-18: qty 2

## 2022-12-18 MED ORDER — FENTANYL CITRATE (PF) 100 MCG/2ML IJ SOLN
INTRAMUSCULAR | Status: AC | PRN
Start: 2022-12-18 — End: 2022-12-18
  Administered 2022-12-18: 50 ug via INTRAVENOUS

## 2022-12-18 MED ORDER — APIXABAN (ELIQUIS) VTE STARTER PACK (10MG AND 5MG): ORAL_TABLET | ORAL | 0 refills | Status: DC

## 2022-12-18 MED ORDER — LIDOCAINE-EPINEPHRINE 1 %-1:100000 IJ SOLN
20.0000 mL | Freq: Once | INTRAMUSCULAR | Status: AC
  Administered 2022-12-18: 10 mL via INTRADERMAL

## 2022-12-18 MED ORDER — LIDOCAINE-EPINEPHRINE 1 %-1:100000 IJ SOLN
INTRAMUSCULAR | Status: AC
  Filled 2022-12-18: qty 1

## 2022-12-18 MED ORDER — LIDOCAINE HCL 1 % IJ SOLN
INTRAMUSCULAR | Status: AC
  Filled 2022-12-18: qty 20

## 2022-12-18 MED ORDER — APIXABAN 5 MG PO TABS: 5.0000 mg | ORAL_TABLET | Freq: Two times a day (BID) | ORAL | Status: DC

## 2022-12-18 MED ORDER — MIDAZOLAM HCL 2 MG/2ML IJ SOLN
INTRAMUSCULAR | Status: AC
  Filled 2022-12-18: qty 2

## 2022-12-18 MED ORDER — CHLORHEXIDINE GLUCONATE CLOTH 2 % EX PADS
6.0000 | MEDICATED_PAD | Freq: Every day | CUTANEOUS | Status: DC
  Administered 2022-12-18: 6 via TOPICAL

## 2022-12-18 MED ORDER — ENSURE MAX PROTEIN PO LIQD: 11.0000 [oz_av] | Freq: Two times a day (BID) | ORAL | Status: DC

## 2022-12-18 MED ORDER — MIDAZOLAM HCL 2 MG/2ML IJ SOLN
INTRAMUSCULAR | Status: AC | PRN
  Administered 2022-12-18: 1 mg via INTRAVENOUS

## 2022-12-18 MED ORDER — LIDOCAINE HCL 1 % IJ SOLN
20.0000 mL | Freq: Once | INTRAMUSCULAR | Status: AC
  Administered 2022-12-18: 20 mL via INTRADERMAL

## 2022-12-18 MED ORDER — HEPARIN SOD (PORK) LOCK FLUSH 100 UNIT/ML IV SOLN
500.0000 [IU] | Freq: Once | INTRAVENOUS | Status: AC
  Administered 2022-12-18: 500 [IU] via INTRAVENOUS
  Filled 2022-12-18: qty 5

## 2022-12-18 MED ORDER — OXYCODONE HCL 5 MG PO TABS: 5.0000 mg | ORAL_TABLET | Freq: Four times a day (QID) | ORAL | 0 refills | Status: DC | PRN

## 2022-12-18 MED ORDER — SUCRALFATE 1 G PO TABS: 1.0000 g | ORAL_TABLET | Freq: Four times a day (QID) | ORAL | 0 refills | Status: DC

## 2022-12-18 MED ORDER — FENTANYL CITRATE (PF) 100 MCG/2ML IJ SOLN
INTRAMUSCULAR | Status: AC
  Filled 2022-12-18: qty 2

## 2022-12-18 MED ORDER — SODIUM CHLORIDE 0.9% FLUSH
5.0000 mL | INTRAVENOUS | 3 refills | Status: DC | PRN
  Filled 2022-12-18 – 2023-03-01 (×2): qty 300, 30d supply, fill #0
  Filled 2023-05-10: qty 300, 30d supply, fill #1
  Filled 2023-05-25: qty 300, 30d supply, fill #2

## 2022-12-18 NOTE — Procedures (Signed)
Interventional Radiology Procedure:   Indications:   Procedure: Port placement  Findings:Left jugular port, tip at SVC/RA junction.    Complications: None     EBL: Minimal, less than 10 ml  Plan: Port is accessed and ready to use.    Larson Limones R. Lowella Dandy, MD  Pager: [phone]

## 2022-12-18 NOTE — Consult Note (Addendum)
WOC consulted for wounds to buttocks/sacrum.  Patient tells WOC RN that she has had a history of herpes simplex for many years and will get a flare on her buttocks area if she is under stress.  I did assess area, scattered raised red rash, not a wound.    Secure chat sent regarding above to primary MD who will evaluate further to assess need for antiviral.   WOC team will not follow. Re-consult if further needs arise.   Thank you,    Priscella Mann MSN, RN-BC, [employer] [phone]

## 2022-12-18 NOTE — Progress Notes (Signed)
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   No Known Allergies  Patient Measurements: Height: 5\' 3"  (160 cm) Weight: 49.9 kg (110 lb) IBW/kg (Calculated) : 52.4 Heparin Dosing Weight: TBW  Vital Signs: Temp: 98.6 F (37 C) (11/21 2149) Temp Source: Oral (11/21 2149) BP: 120/69 (11/21 2149) Pulse Rate: 89 (11/21 2149)  Labs: Recent Labs    12/16/22 0554 12/17/22 0655 12/17/22 0657 12/17/22 1556 12/17/22 2326 12/18/22 0635  HGB 13.2 11.1*  --   --   --  11.6*  HCT 39.9 33.2*  --   --   --  33.4*  PLT 459* 394  --   --   --  397  HEPARINUNFRC 0.40 0.25*  --  0.27* 0.63 0.60  CREATININE 0.46  --  0.37*  --   --   --     Estimated Creatinine Clearance: 56.7 mL/min (A) (by C-G formula based on SCr of 0.37 mg/dL (L)).  Assessment: 62 yo female admitted 11/12 for acute on chronic pancreatitis symptoms. Found to have a pancreatic mass concerning for malignancy. CT also reveals splenic, SMV, and portal vein thrombus. GI uncertain if this is true thrombus vs tumor compression, but for now would like Pharmacy to start IV heparin, until HemOnc can see on Monday. 11/15 heparin protocol per pharmacy ordered then cancelled for percutaneous biliary drain placement 11/18 pharmacy re-consulted to dose heparin   11/17 Baseline INR, mildly elevated; consistent with acute hepatic congestion from above processes, vitamin K 10 mg IV given 11/17 @ 0931 am  Prior anticoagulation: none PTA; on Lovenox 40 mg/d; last dose given 11/16 PM  Significant events:  11/17 percutaneous cholecystostomy tube placement.  Today, 12/18/2022: Heparin level 0.60 is therapeutic on heparin drip @ 1300 units/hr Heparin stopped for port catheter placement 11/22 @ 0900 CBC: Hg stable 11.6, plts WNL SCr stable 0.37 No bleeding documented   Goal of Therapy: Monitor platelets by anticoagulation  protocol: Yes  Plan: Discontinue heparin drip Transition to Eliquis 10 mg PO BID x7 days, then 5 mg PO BID Monitor for signs of bleeding or thrombosis Provide Eliquis starter pack from [facility] [facility] to provide Eliquis education   [employer], [place].D Candidate 12/18/2022 9:29 AM

## 2022-12-18 NOTE — Plan of Care (Signed)
  Problem: Education: Goal: Knowledge of General Education information will improve Description: Including pain rating scale, medication(s)/side effects and non-pharmacologic comfort measures Outcome: Progressing   Problem: Clinical Measurements: Goal: Ability to maintain clinical measurements within normal limits will improve Outcome: Progressing Goal: Will remain free from infection Outcome: Progressing Goal: Diagnostic test results will improve Outcome: Progressing Goal: Respiratory complications will improve Outcome: Progressing Goal: Cardiovascular complication will be avoided Outcome: Progressing   Problem: Activity: Goal: Risk for activity intolerance will decrease Outcome: Progressing   Problem: Nutrition: Goal: Adequate nutrition will be maintained Outcome: Progressing   Problem: Elimination: Goal: Will not experience complications related to bowel motility Outcome: Progressing   Problem: Pain Management: Goal: General experience of comfort will improve Outcome: Progressing   Problem: Safety: Goal: Ability to remain free from injury will improve Outcome: Progressing   Problem: Skin Integrity: Goal: Risk for impaired skin integrity will decrease Outcome: Progressing

## 2022-12-18 NOTE — Progress Notes (Signed)
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   No Known Allergies  Patient Measurements: Height: 5\' 3"  (160 cm) Weight: 49.9 kg (110 lb) IBW/kg (Calculated) : 52.4 Heparin Dosing Weight: TBW  Vital Signs: Temp: 98.6 F (37 C) (11/21 2149) Temp Source: Oral (11/21 2149) BP: 120/69 (11/21 2149) Pulse Rate: 89 (11/21 2149)  Labs: Recent Labs    12/16/22 0554 12/17/22 0655 12/17/22 0657 12/17/22 1556 12/17/22 2326 12/18/22 0635  HGB 13.2 11.1*  --   --   --  11.6*  HCT 39.9 33.2*  --   --   --  33.4*  PLT 459* 394  --   --   --  397  HEPARINUNFRC 0.40 0.25*  --  0.27* 0.63 0.60  CREATININE 0.46  --  0.37*  --   --   --     Estimated Creatinine Clearance: 56.7 mL/min (A) (by C-G formula based on SCr of 0.37 mg/dL (L)).  Assessment: 62 yo female admitted 11/12 for acute on chronic pancreatitis symptoms. Found to have a pancreatic mass concerning for malignancy. CT also reveals splenic, SMV, and portal vein thrombus. GI uncertain if this is true thrombus vs tumor compression, but for now would like Pharmacy to start IV heparin, until HemOnc can see on Monday. 11/15 heparin protocol per pharmacy ordered then cancelled for percutaneous biliary drain placement 11/18 pharmacy re-consulted to dose heparin   11/17 Baseline INR, mildly elevated; consistent with acute hepatic congestion from above processes, vitamin K 10 mg IV given 11/17 @ 0931 am  Prior anticoagulation: none PTA; on Lovenox 40 mg/d; last dose given 11/16 PM  Significant events:  11/17 percutaneous cholecystostomy tube placement.  Today, 12/18/2022: Heparin level 0.60 is therapeutic on heparin drip @ 1300 units/hr CBC: Hg stable 11.6, plts WNL SCr stable 0.37 No bleeding documented   Goal of Therapy: Heparin level 0.3-0.7 units/ml Monitor platelets by anticoagulation protocol:  Yes  Plan: Continue heparin drip @ 1300 units/hr Daily CBC, daily heparin level Monitor for signs of bleeding or thrombosis F/u transition to DOAC after port catheter placement   [employer], Pharm.D Candidate 12/18/2022 7:23 AM

## 2022-12-18 NOTE — Plan of Care (Addendum)
Patient is alert and oriented X4, boyfriend and son aware of discharge instructions. MD Gonfa at the bedside, assessment completed, chest port de-accessed, and IV's removed. Discharge information went over with boyfriend, and boyfriend demonstrated knowledge of tube, flushing, and how to empty.  All patient belongings returned to patient and patient wheeled down and transported by boyfriend home. Patient left in stable condition with necessary materials for biliary drain.  Problem: Education: Goal: Knowledge of General Education information will improve Description: Including pain rating scale, medication(s)/side effects and non-pharmacologic comfort measures Outcome: Progressing   Problem: Health Behavior/Discharge Planning: Goal: Ability to manage health-related needs will improve Outcome: Progressing   Problem: Clinical Measurements: Goal: Ability to maintain clinical measurements within normal limits will improve Outcome: Progressing Goal: Will remain free from infection Outcome: Progressing Goal: Diagnostic test results will improve Outcome: Progressing Goal: Respiratory complications will improve Outcome: Progressing Goal: Cardiovascular complication will be avoided Outcome: Progressing   Problem: Activity: Goal: Risk for activity intolerance will decrease Outcome: Progressing   Problem: Nutrition: Goal: Adequate nutrition will be maintained Outcome: Progressing   Problem: Coping: Goal: Level of anxiety will decrease Outcome: Progressing   Problem: Elimination: Goal: Will not experience complications related to bowel motility Outcome: Progressing Goal: Will not experience complications related to urinary retention Outcome: Progressing   Problem: Pain Management: Goal: General experience of comfort will improve Outcome: Progressing   Problem: Safety: Goal: Ability to remain free from injury will improve Outcome: Progressing   Problem: Skin Integrity: Goal: Risk  for impaired skin integrity will decrease Outcome: Progressing

## 2022-12-18 NOTE — Discharge Summary (Signed)
Physician Discharge Summary  Kathryn Underwood JJO:[id number] DOB: July 18, 1960 DOA: 12/08/2022  PCP: Bary Leriche, PA-C  Admit date: 12/08/2022 Discharge date: 12/18/2022 Admitted From: Home Disposition: Home Recommendations for Outpatient Follow-up:  Outpatient follow-up with PCP in 1 week IR, oncology and GI to arrange outpatient follow-up Check CMP and CBC at follow-up Patient has mole or skin lesion on his right chest.  Recommend referral to dermatology Please follow up on the following pending results: None  Home Health: Not indicated Equipment/Devices: Not indicated  Discharge Condition: Stable CODE STATUS: Full code  Follow-up Information     Allwardt, Alyssa M, PA-C. Schedule an appointment as soon as possible for a visit in 1 week(s).   Specialty: Physician Assistant Contact information: [address] [place] [address] [phone]                 Hospital course 62 year old F with PMH of EtOH pancreatitis with pseudocyst, alcohol use disorder, neuropathy, anxiety, depression, GERD and tobacco use disorder presenting with decreased oral intake, fatigue and unintentional weight loss for about 2 months and found to have pancreatic mass on imaging.  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  EUS not successful on 11/14 due to foodstuff in stomach.   Patient underwent EGD/EUS on 11/16.  After procedure, she has significant abdominal pain for which she received IV fentanyl twice while in PACU.  Patient continued to have significant right-sided abdominal pain despite pain medications.  CT abdomen and pelvis obtained and raise concern for GOO likely due to infiltrating pancreatic mass, possible ulcer with send gastric antrum, GB distention with intrahepatic and extrahepatic biliary ductal dilation, and splenic, superior mesenteric and portal proximal portal vein  thrombosis.  IR consulted and patient underwent percutaneous cholecystostomy on 11/17.     Pancreatic biopsy confirmed adenocarcinoma.  Oncology consulted and recommended CT chest for staging, port a cath placement and outpatient follow-up for treatment.  She had a port a cath placed on the day of discharge.  Cleared for discharge by IR, oncology and gastroenterology for outpatient follow-up.  GI recommended not advancing beyond full liquid diet until outpatient follow-up.  Of note, patient reported a skin rash over her backside.  Exam concerning for shingles.  She reports prior history of this.  Started on Valtrex 1 g twice daily for 7 days.  Reassess at follow-up.  See individual problem list below for more.   Problems addressed during this hospitalization Pancreatic cancer: Patient presented with unintentional weight loss, poor p.o. intake and fatigue.  Initial CT and MRCP concerning for large locally aggressive pancreatic neoplasm/adenocarcinoma with vascular and biliary involvement.  EGD/EUS on 11/16.  Pathology confirmed pancreatic adenocarcinoma.  CT chest without significant finding or intrathoracic metastasis.  Port a cath placed on 11/22. -Outpatient follow-up with oncology and GI.   Gastric outlet obstruction/gallbladder distention due to pancreatic head cancer: Significant abdominal pain after EUS on 11/16.  CT showed GOO, GB distention, biliary ductal dilation and venous thrombosis.  Cholecystostomy drain placed by IR on 11/17.  Pain, LFT and bilirubin improved. -Ceftriaxone and Flagyl 11/16 through 11/20 -GI recommended continuing full liquid diet, and not advancing until outpatient follow-up. -Discharged on Protonix and Carafate. -GI to arrange outpatient follow-up   Portal vein/superior mesenteric/splenic vein thrombosis: Noted on CT on 11/16.  Unclear if this is blood clot or infiltrating malignancy.  Started on IV heparin and discharged on p.o. Eliquis.   Hx alcoholic  pancreatitis with pseudocyst: CT abdomen and pelvis as above.  Patient without significant abdominal pain, nausea or vomiting.  No significant elevation of lipase or LFT.  CT and MRI as above. -Outpatient follow-up with GI   Elevated AST/ALP/hyperbilirubinemia: Mild.  Improved after cholecystostomy tube. -Repeat CMP at follow-up   Esophagitis: Noted on pathology from EGD on 11/14 -PPI and Carafate as above   Hypokalemia/hypomagnesemia: Resolved.   Metabolic alkalosis: Likely from GI loss.  Resolved.   Anxiety and depression: Understandably overwhelmed.  Not in crisis. 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 -Counseled on smoking cessation   Emphysema: No respiratory symptoms -Encouraged smoking cessation  Shingles/varicella zoster: Reported skin rash on backside on the day of discharge.  Reports prior history of this.  Reports taking Valtrex when she has flares. -Valtrex 1 g twice daily for 7 days   Severe malnutrition. Nutrition Problem: Severe Malnutrition Etiology: chronic illness (pancreatitis) Signs/Symptoms: energy intake < or equal to 75% for > or equal to 1 month, severe fat depletion, moderate muscle depletion, percent weight loss Interventions: Boost Breeze, MVI, Education     Time spent 45 minutes  Vital signs Vitals:   12/18/22 1040 12/18/22 1045 12/18/22 1103 12/18/22 1414  BP: 101/67  114/70 98/64  Pulse: 89 88 90 99  Temp:    99.7 F (37.6 C)  Resp: 12 11 12 16   Height:      Weight:      SpO2: 98% 99% 96% 98%  TempSrc:    Oral  BMI (Calculated):         Discharge exam  GENERAL: No apparent distress.  Nontoxic. HEENT: MMM.  Vision and hearing grossly intact.  NECK: Supple.  No apparent JVD.  RESP:  No IWOB.  Fair aeration bilaterally. CVS:  RRR. Heart sounds normal.  ABD/GI/GU: BS+. Abd soft, NTND.  Biliary drain in  place. MSK/EXT:  Moves extremities. No apparent deformity. No edema.  SKIN: Shingles rash over backside.  Maller skin lesion over right chest.  Port-a-cath on left chest NEURO: Awake and alert. Oriented appropriately.  No apparent focal neuro deficit. PSYCH: Calm. Normal affect.   Discharge Instructions Discharge Instructions     Diet full liquid   Complete by: As directed    Discharge instructions   Complete by: As directed    It has been a pleasure taking care of you!  You were hospitalized due to pancreatic cancer and its complications.  You have biliary drain for gallbladder distention/obstruction.  We have started you on blood thinner due to possible blood clot in intra-abdominal veins.  Your gastroenterologist recommended continuing full liquid diet until outpatient follow-up.  Oncology will arrange outpatient follow-up to discuss treatment for pancreatic cancer.  Take your medications as prescribed.  Please review your new medication list and the directions on your medications before you take them.  Take care,   Increase activity slowly   Complete by: As directed    No wound care   Complete by: As directed       Allergies as of 12/18/2022   No Known Allergies      Medication List     TAKE these medications    albuterol 108 (90 Base) MCG/ACT inhaler Commonly known as: VENTOLIN HFA Inhale 2 puffs into the lungs every 6 (six) hours as needed for wheezing or shortness of breath.   Apixaban Starter Pack (10mg  and  5mg ) Commonly known as: ELIQUIS STARTER PACK Take as directed on package: start with two-5mg  tablets twice daily for 7 days. On day 8, switch to one-5mg  tablet twice daily.   Eliquis 5 MG Tabs tablet Generic drug: apixaban Take 2 tablets (10 mg total) by mouth 2 (two) times daily for 7 days, THEN 1 tablet (5 mg total) 2 (two) times daily for 21 days. Start taking on: December 18, 2022   [facility] 200-5 MCG/ACT Aero Generic drug: mometasone-formoterol Inhale 2  puffs into the lungs 2 (two) times daily.   Ensure Max Protein Liqd Take 330 mLs (11 oz total) by mouth 2 (two) times daily.   folic acid 1 MG tablet Commonly known as: FOLVITE Take 1 tablet (1 mg total) by mouth daily.   gabapentin 300 MG capsule Commonly known as: NEURONTIN Take 2 capsules (600 mg total) by mouth 2 (two) times daily. What changed:  how much to take when to take this   mirtazapine 45 MG tablet Commonly known as: REMERON Take 45 mg by mouth at bedtime. What changed: Another medication with the same name was removed. Continue taking this medication, and follow the directions you see here.   multivitamin with minerals Tabs tablet Take 1 tablet by mouth daily. Start taking on: December 19, 2022   oxyCODONE 5 MG immediate release tablet Commonly known as: Oxy IR/ROXICODONE Take 1 tablet (5 mg total) by mouth every 6 (six) hours as needed for up to 5 days for moderate pain (pain score 4-6) or severe pain (pain score 7-10).   pantoprazole 40 MG tablet Commonly known as: PROTONIX Take 1 tablet (40 mg total) by mouth 2 (two) times daily.   QUEtiapine 100 MG tablet Commonly known as: SEROQUEL Take 100-200 mg by mouth at bedtime. What changed: Another medication with the same name was removed. Continue taking this medication, and follow the directions you see here.   sucralfate 1 g tablet Commonly known as: Carafate Take 1 tablet (1 g total) by mouth 4 (four) times daily for 24 days.   thiamine 100 MG tablet Commonly known as: VITAMIN B1 Take 1 tablet (100 mg total) by mouth daily.   valACYclovir 1000 MG tablet Commonly known as: Valtrex Take 1 tablet (1,000 mg total) by mouth 2 (two) times daily for 7 days.        Consultations: Gastroenterology Interventional radiology Oncology  Procedures/Studies: 11/14-EUS not successful due to food stuff in his stomach.  Pathology suggested esophagitis 11/16-EGD/EUS.  Pathology confirmed  adenocarcinoma 11/17-percutaneous cholecystostomy tube 11/22-port a cath placement   IR IMAGING GUIDED PORT INSERTION  Result Date: 12/18/2022 INDICATION: 62 year old with pancreatic cancer. Port-A-Cath needed for treatment. EXAM: FLUOROSCOPIC AND ULTRASOUND GUIDED PLACEMENT OF A SUBCUTANEOUS PORT. Physician: Rachelle Hora. Henn, MD MEDICATIONS: Moderate sedation ANESTHESIA/SEDATION: Moderate (conscious) sedation was employed during this procedure. A total of Versed 1mg  and fentanyl 50 mcg was administered intravenously at the order of the provider performing the procedure. Total intra-service moderate sedation time: 57 minutes. Patient's level of consciousness and vital signs were monitored continuously by radiology nurse throughout the procedure under the supervision of the provider performing the procedure. FLUOROSCOPY: Radiation Exposure Index (as provided by the fluoroscopic device): 3 mGy Kerma COMPLICATIONS: None immediate. PROCEDURE: The risks of the procedure were explained to the patient. Informed consent was obtained. Patient was placed supine on the interventional table. Ultrasound confirmed a patent left internal jugular vein. The left chest and neck were cleaned with a skin antiseptic and a sterile drape was  placed. Maximal barrier sterile technique was utilized including caps, mask, sterile gowns, sterile gloves, sterile drape, hand hygiene and skin antiseptic. The left neck was anesthetized with 1% lidocaine. Small incision was made in the left neck with a blade. Micropuncture set was placed in the left IJ with ultrasound guidance. The micropuncture wire was used for measurement purposes. The left chest was anesthetized with 1% lidocaine with epinephrine. #15 blade was used to make an incision and a subcutaneous port pocket was formed. 8 french Power Port was assembled. Subcutaneous tunnel was formed with a stiff tunneling device. The port catheter was brought through the subcutaneous tunnel. The  port was placed in the subcutaneous pocket. The micropuncture set was exchanged for a peel-away sheath. The catheter was placed through the peel-away sheath but the tip was too short in the upper SVC. Attempted to place a longer catheter over a wire but this was unsuccessful. As a result, the access was completely removed. Left jugular vein was punctured again using ultrasound guidance and micropuncture dilator set was placed. A new port catheter was tunneled between the subcutaneous pocket and the vein site. A new peel-away sheath was placed and the port catheter was advanced into the central venous system. Catheter tip was placed at the superior cavoatrial junction. The catheter was cut and attached to the port. Port was placed within the subcutaneous pocket. Catheter placement was confirmed with fluoroscopy. The port was accessed and flushed with saline. The port pocket was closed using two layers of absorbable sutures and Dermabond. The vein skin site was closed using a single layer of absorbable suture and Dermabond. Sterile dressings were applied. Patient tolerated the procedure well without an immediate complication. Ultrasound and fluoroscopic images were taken and saved for this procedure. IMPRESSION: Placement of a subcutaneous port device. Catheter tip at the superior cavoatrial junction and ready to be used. Electronically Signed   By: Richarda Overlie M.D.   On: 12/18/2022 14:03   CT CHEST WO CONTRAST  Result Date: 12/17/2022 CLINICAL DATA:  Staging for recently diagnosed pancreatic cancer. EXAM: CT CHEST WITHOUT CONTRAST TECHNIQUE: Multidetector CT imaging of the chest was performed following the standard protocol without IV contrast. RADIATION DOSE REDUCTION: This exam was performed according to the departmental dose-optimization program which includes automated exposure control, adjustment of the mA and/or kV according to patient size and/or use of iterative reconstruction technique. COMPARISON:   Chest, abdomen and pelvis CT dated 12/08/2022 and abdomen and pelvis CT dated 12/12/2022. Abdomen MRI dated 12/09/2022. Abdomen and pelvis CT dated 07/27/2021. Chest CT dated 07/24/2021. FINDINGS: Cardiovascular: Atheromatous calcifications, including the coronary arteries and aorta. Normal sized heart. Small pericardial effusion with an interval decrease in size with a current maximum thickness of 1.1 cm. Mediastinum/Nodes: No enlarged mediastinal or axillary lymph nodes. Thyroid gland and trachea are unremarkable. Previously demonstrated diffuse low-density wall thickening involving the distal esophagus. Lungs/Pleura: Stable small area of ground-glass opacity in the medial left upper lobe. This remains less prominent than seen initially on 07/24/2021 suggesting an infectious or inflammatory process. Mild bilateral linear atelectasis and scarring. No solid lung nodules or pleural fluid. Upper Abdomen: The previously described large pancreatic head mass is partially included and unchanged. Again demonstrated is somewhat patchy, confluent low density in the liver on the right felt to represent a flow anomaly on previous studies. The previously demonstrated portal vein thrombus is not visible without intravenous contrast today. Multiple small liver cysts are unchanged. Musculoskeletal: Lower cervical spine degenerative changes and minimal thoracic  spine degenerative changes. No evidence of bony metastatic disease. IMPRESSION: 1. No evidence of metastatic disease in the chest. 2. Stable small area of ground-glass opacity in the medial left upper lobe, likely an infectious or inflammatory process. 3. Small pericardial effusion with an interval decrease in size. 4. Previously demonstrated diffuse low-density wall thickening involving the distal esophagus. This most likely due to reflux esophagitis. 5. Calcific coronary artery and aortic atherosclerosis. 6. Stable recently described pancreatic head mass and probable  perfusion abnormality in the liver. Without the previous studies for comparison, metastatic disease/direct spread of tumor in the liver could have a similar appearance. Aortic Atherosclerosis (ICD10-I70.0). Electronically Signed   By: Beckie Salts M.D.   On: 12/17/2022 11:43   IR Perc Cholecystostomy  Addendum Date: 12/13/2022   ADDENDUM REPORT: 12/13/2022 20:18 ADDENDUM: Contrast: 5 mL Omnipaque 300 Electronically Signed   By: Richarda Overlie M.D.   On: 12/13/2022 20:18   Result Date: 12/13/2022 INDICATION: 62 year old with abdominal pain and severe gallbladder distension. New fluid around the gallbladder after upper endoscopy and EUS. Concern for a pancreatic malignancy. EXAM: IMAGE GUIDED PERCUTANEOUS CHOLECYSTOSTOMY TUBE PLACEMENT MEDICATIONS: Inpatient and receiving IV antibiotics. ANESTHESIA/SEDATION: Moderate (conscious) sedation was employed during this procedure. A total of Versed 1.5 mg and Fentanyl 50 mcg was administered intravenously by the radiology nurse. Total intra-service moderate Sedation Time: 15 minutes. The patient's level of consciousness and vital signs were monitored continuously by radiology nursing throughout the procedure under my direct supervision. FLUOROSCOPY: Radiation Exposure Index (as provided by the fluoroscopic device): 0.1 mGy Kerma COMPLICATIONS: None immediate. PROCEDURE: Informed written consent was obtained from the patient after a thorough discussion of the procedural risks, benefits and alternatives. All questions were addressed. A timeout was performed prior to the initiation of the procedure. Patient was placed supine. The right side of the abdomen was prepped and draped in sterile fashion. Maximal barrier sterile technique was utilized including caps, mask, sterile gowns, sterile gloves, sterile drape, hand hygiene and skin antiseptic. Ultrasound demonstrated a very distended gallbladder. The skin was anesthetized using 1% lidocaine. Using ultrasound guidance, 21  gauge needle was directed into the gallbladder and a 0.018 wire was placed. A transitional dilator set was placed. Wire was advanced into the gallbladder and the tract was dilated to accommodate a 10 [place] multipurpose drain. Approximately 200 mL of dark green fluid was removed. The gallbladder was decompressed at the end of the procedure. Contrast injection confirmed placement in the gallbladder. Drain was sutured to skin and attached to a gravity bag. Fluid was sent for culture and cytology. Fluoroscopic and ultrasound images were taken and saved for documentation. FINDINGS: Gallbladder was severely distended. Fluid and edematous tissue around the gallbladder. Gallbladder was decompressed at the end of the procedure. 200 mL of fluid was removed. IMPRESSION: Successful image guided placement of a percutaneous cholecystostomy tube. Electronically Signed: By: Richarda Overlie M.D. On: 12/13/2022 19:47   CT ABDOMEN PELVIS W CONTRAST  Result Date: 12/12/2022 CLINICAL DATA:  Acute abdominal pain,  pancreatitis, pancreatic pass EXAM: CT ABDOMEN AND PELVIS WITH CONTRAST TECHNIQUE: Multidetector CT imaging of the abdomen and pelvis was performed using the standard protocol following bolus administration of intravenous contrast. RADIATION DOSE REDUCTION: This exam was performed according to the departmental dose-optimization program which includes automated exposure control, adjustment of the mA and/or kV according to patient size and/or use of iterative reconstruction technique. CONTRAST:  OMNIPAQUE IOHEXOL 300 MG/ML  SOLN COMPARISON:  12/09/2022, 12/08/2022 FINDINGS: Lower chest: No acute  pleural or parenchymal lung disease. Stable pericardial effusion. Stable distal esophageal wall thickening. Small hiatal hernia. Hepatobiliary: Marked distension of the gallbladder is again noted, with intrahepatic duct dilation and proximal common bile duct distension, due to occlusion by the infiltrative pancreatic mass image  previously. Heterogeneous appearance of the liver parenchyma within the inferior right lobe and extending along the gallbladder fossa could likely due to known portal vein thrombosis or geographic hepatic steatosis as seen on recent MRI. Multiple liver hypodensities are unchanged, previously characterized on MRI. Pancreas: The infiltrative mass centered at the pancreatic head is again noted, measuring up to 6.1 x 4.4 cm, compatible with pancreatic neoplasm based on MRI findings. Upstream pancreatic parenchymal atrophy. Decreased fluid collections surrounding the pancreas, now measuring 5.6 x 2.5 cm image 36/2 and 2.4 x 2.2 cm reference image 44/2. Spleen: Normal in size without focal abnormality. Adrenals/Urinary Tract: Stable appearance of the adrenals and kidneys. Bladder is decompressed, limiting its evaluation. Stomach/Bowel: Prominent gastric distension again noted, with functional gastric outlet obstruction due to mass effect by the infiltrative pancreatic mass described above. Likely ulceration within the gastric antrum measuring up to 1.8 cm reference image 26/5. No evidence of perforation. No evidence of bowel obstruction or ileus. Vascular/Lymphatic: Continued occlusion of the splenic vein, SMV, and proximal aspect of the portal vein again noted due to extrinsic compression by the infiltrative pancreatic mass. There is nonocclusive thrombus within the main portal vein near the bifurcation. Extensive venous collaterals are seen throughout the mesentery unchanged since prior exam. Stable aortic atherosclerosis. No discrete pathologic adenopathy identified. Numerous subcentimeter mesenteric lymph nodes adjacent to the pancreatic mass in the upper abdomen are nonspecific. Reproductive: Calcified uterine fibroids unchanged. No adnexal masses. Other: Free fluid surrounds the distended gallbladder within the right upper quadrant. No free intraperitoneal gas. No abdominal wall hernia. Musculoskeletal: No acute or  destructive bony abnormalities. Reconstructed images demonstrate no additional findings. IMPRESSION: 1. Stable infiltrating pancreatic mass, with thrombosis of the splenic vein, SMV, and proximal portal vein. Nonocclusive thrombus within the main portal vein just proximal to the bifurcation. 2. Decreased size of the peripancreatic fluid collections as above. New simple appearing free fluid surrounding a distended gallbladder and right upper quadrant could reflect interval rupture of these peripancreatic fluid collections. 3. Continued gastric distension compatible with functional gastric outlet obstruction due to the infiltrating pancreatic mass. Suspected ulcer within the gastric antrum, without evidence of perforation. 4. Marked gallbladder distension, with intrahepatic and extrahepatic biliary duct dilation, due to downstream common bile duct obstruction due to the pancreatic mass as seen on recent MRI. No evidence of gallbladder wall thickening or calcified gallstones. 5.  Aortic Atherosclerosis (ICD10-I70.0). Electronically Signed   By: Sharlet Salina M.D.   On: 12/12/2022 17:56   MR ABDOMEN MRCP W WO CONTAST  Result Date: 12/09/2022 CLINICAL DATA:  Pancreatitis with a pancreatic mass. EXAM: MRI ABDOMEN WITHOUT AND WITH CONTRAST (INCLUDING MRCP) TECHNIQUE: Multiplanar multisequence MR imaging of the abdomen was performed both before and after the administration of intravenous contrast. Heavily T2-weighted images of the biliary and pancreatic ducts were obtained, and three-dimensional MRCP images were rendered by post processing. CONTRAST:  5mL GADAVIST GADOBUTROL 1 MMOL/ML IV SOLN COMPARISON:  CT scans including 12/08/2022, 07/27/2021, 07/24/2021 for comparison. MRCP 08/03/2021. FINDINGS: Lower chest: Slight linear signal at the lung bases. Likely scar or atelectasis. Moderate pericardial effusion again identified. Hepatobiliary: There are geographic areas of signal drop on out of phase imaging in the  liver particularly involving segments 5, 6  and 4 consistent with a geographic areas of fatty infiltration. There is mosaic appearance to enhancement of the liver in the arterial phase as well. As seen on prior CT there is thrombus within the main portal vein with several upper abdominal collateral vessels. Separate multiple hepatic cysts identified which are predominantly under a cm in size. On series 27, image 11 in the dome of the right hepatic lobe segment 8 is a small enhancing nodule measuring 10 mm. This has mildly bright on T2 precontrast. This has an indeterminate lesion. There is moderate intrahepatic biliary ductal dilatation diffusely. The common hepatic duct has a diameter of 15 mm proximally with focal high-grade stricture over a long segment of the common duct extending down to the pancreatic head and ampulla. Gallbladder is dilated with layering sludge and possible stones. Pancreas: Dysmorphic appearance to the pancreas. They are smaller areas of residual pancreatic normal enhancement in the body and head/uncinate region. Centered along the neck and head region is a spiculated heterogeneous mass as seen on CT scan showing restricted diffusion. This has mass somewhat difficult to measure with the surrounding extensive other abnormality but is estimated in dimension on series 27, image 50 4.9 x 5.5 cm. This extends caudal to the pancreas into the central mesentery and is associated with occlusion of the splenic vein. There is encasement of the splenic artery. Extensive central mesenteric collaterals identified. There are several areas which has poor enhancement along the tail as well which could be the sequela of additional history of pancreatitis. The fluid collection extending anterior to the pancreas along the lesser sac on today's examination measures 6.0 by 5.2 cm. There are several other smaller collections identified adjacent to this larger. There is also a significant fluid collection in the  central mesentery just caudal to the head of the pancreas on series 26, image 25 measuring 5.0 by 3.1 cm. In principle, these has a differential but easily could be pseudocysts. No associated nodular wall enhancement. Spleen:  Within normal limits in size and appearance. Adrenals/Urinary Tract: No masses identified. No evidence of hydronephrosis. Stomach/Bowel: Debris noted in the nondilated stomach. The visualized small and large bowel are nondilated. Scattered colonic stool. Vascular/Lymphatic: Atherosclerotic changes identified. Normal caliber aorta and IVC. 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 The aforementioned mass lesion does appear to encase the SMA with some narrowing. Other:  Mild ascites. Musculoskeletal: Mild degenerative changes along the spine. IMPRESSION: Large locally aggressive heterogeneous soft tissue mass with spiculations identified centered of the neck and head of the pancreas. Associated involvement of the SMA and SMV with the occlusion of the SMV and narrowing of the SMA. 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 Worrisome for pancreatic neoplasm or adenocarcinoma. Relatively poor visualization of the rest of the pancreas with the areas of distortion poor enhancement. This could be the sequela of neoplasm in the additional changes of pancreatitis. Several fluid collections are identified which could represent pseudo cysts with the largest extending along the lesser sac measuring up to 6 cm. Additional dominant collection seen caudal to the pancreatic head. 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 The gallbladder is also dilated with layering sludge and or stones. Multiple cystic liver lesions  identified. There is 1 enhancing lesion in the dome in segment 8  which is indeterminate. With the pancreatic findings this could be spread of disease but would has a differential recommend attention on short follow-up. Geographic areas of fatty liver infiltration which can relate to the hepatic blood flow abnormalities. Electronically Signed   By: Karen Kays M.D.   On: 12/09/2022 14:48   MR 3D Recon At Scanner  Result Date: 12/09/2022 CLINICAL DATA:  Pancreatitis with a pancreatic mass. EXAM: MRI ABDOMEN WITHOUT AND WITH CONTRAST (INCLUDING MRCP) TECHNIQUE: Multiplanar multisequence MR imaging of the abdomen was performed both before and after the administration of intravenous contrast. Heavily T2-weighted images of the biliary and pancreatic ducts were obtained, and three-dimensional MRCP images were rendered by post processing. CONTRAST:  5mL GADAVIST GADOBUTROL 1 MMOL/ML IV SOLN COMPARISON:  CT scans including 12/08/2022, 07/27/2021, 07/24/2021 for comparison. MRCP 08/03/2021. FINDINGS: Lower chest: Slight linear signal at the lung bases. Likely scar or atelectasis. Moderate pericardial effusion again identified. Hepatobiliary: There are geographic areas of signal drop on out of phase imaging in the liver particularly involving segments 5, 6 and 4 consistent with a geographic areas of fatty infiltration. There is mosaic appearance to enhancement of the liver in the arterial phase as well. As seen on prior CT there is thrombus within the main portal vein with several upper abdominal collateral vessels. Separate multiple hepatic cysts identified which are predominantly under a cm in size. On series 27, image 11 in the dome of the right hepatic lobe segment 8 is a small enhancing nodule measuring 10 mm. This has mildly bright on T2 precontrast. This has an indeterminate lesion. There is moderate intrahepatic biliary ductal dilatation diffusely. The common hepatic duct has a diameter of 15 mm proximally with  focal high-grade stricture over a long segment of the common duct extending down to the pancreatic head and ampulla. Gallbladder is dilated with layering sludge and possible stones. Pancreas: Dysmorphic appearance to the pancreas. They are smaller areas of residual pancreatic normal enhancement in the body and head/uncinate region. Centered along the neck and head region is a spiculated heterogeneous mass as seen on CT scan showing restricted diffusion. This has mass somewhat difficult to measure with the surrounding extensive other abnormality but is estimated in dimension on series 27, image 50 4.9 x 5.5 cm. This extends caudal to the pancreas into the central mesentery and is associated with occlusion of the splenic vein. There is encasement of the splenic artery. Extensive central mesenteric collaterals identified. There are several areas which has poor enhancement along the tail as well which could be the sequela of additional history of pancreatitis. The fluid collection extending anterior to the pancreas along the lesser sac on today's examination measures 6.0 by 5.2 cm. There are several other smaller collections identified adjacent to this larger. There is also a significant fluid collection in the central mesentery just caudal to the head of the pancreas on series 26, image 25 measuring 5.0 by 3.1 cm. In principle, these has a differential but easily could be pseudocysts. No associated nodular wall enhancement. Spleen:  Within normal limits in size and appearance. Adrenals/Urinary Tract: No masses identified. No evidence of hydronephrosis. Stomach/Bowel: Debris noted in the nondilated stomach. The visualized small and large bowel are nondilated. Scattered colonic stool. Vascular/Lymphatic: Atherosclerotic changes identified. Normal caliber aorta and IVC. 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 The  aforementioned mass lesion does appear to encase the SMA with  some narrowing. Other:  Mild ascites. Musculoskeletal: Mild degenerative changes along the spine. IMPRESSION: Large locally aggressive heterogeneous soft tissue mass with spiculations identified centered of the neck and head of the pancreas. Associated involvement of the SMA and SMV with the occlusion of the SMV and narrowing of the SMA. 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 Worrisome for pancreatic neoplasm or adenocarcinoma. Relatively poor visualization of the rest of the pancreas with the areas of distortion poor enhancement. This could be the sequela of neoplasm in the additional changes of pancreatitis. Several fluid collections are identified which could represent pseudo cysts with the largest extending along the lesser sac measuring up to 6 cm. Additional dominant collection seen caudal to the pancreatic head. 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 The gallbladder is also dilated with layering sludge and or stones. Multiple cystic liver lesions identified. There is 1 enhancing lesion in the dome in segment 8 which is indeterminate. With the pancreatic findings this could be spread of disease but would has a differential recommend attention on short follow-up. Geographic areas of fatty liver infiltration which can relate to the hepatic blood flow abnormalities. Electronically Signed   By: Karen Kays M.D.   On: 12/09/2022 14:48   CT CHEST ABDOMEN PELVIS W CONTRAST  Result Date: 12/08/2022 CLINICAL DATA:  Dysphagia, decreasing appetite and weight loss * Tracking Code: BO * EXAM: CT CHEST, ABDOMEN, AND PELVIS WITH CONTRAST TECHNIQUE: Multidetector CT imaging of the chest, abdomen and pelvis was performed following the standard protocol during bolus  administration of intravenous contrast. RADIATION DOSE REDUCTION: This exam was performed according to the departmental dose-optimization program which includes automated exposure control, adjustment of the mA and/or kV according to patient size and/or use of iterative reconstruction technique. CONTRAST:  75mL OMNIPAQUE IOHEXOL 300 MG/ML  SOLN COMPARISON:  CT chest, 07/24/2021, abdomen pelvis, 07/27/2021 FINDINGS: CT CHEST FINDINGS Cardiovascular: Scattered aortic atherosclerosis. Normal heart size. Left coronary artery calcifications. Small pericardial effusion. Mediastinum/Nodes: No enlarged mediastinal, hilar, or axillary lymph nodes. Thyroid gland, trachea, and esophagus demonstrate no significant findings. Lungs/Pleura: Diminished ground-glass opacity of the medial left apex measuring 0.7 cm, previously 1.0 cm, nonspecific and infectious or inflammatory requiring no specific further follow-up (series 4, image 37). Mild centrilobular emphysema. Benign, bandlike scarring of the left lung base. No pleural effusion or pneumothorax. Musculoskeletal: No chest wall abnormality. No acute osseous findings. CT ABDOMEN PELVIS FINDINGS Hepatobiliary: Heterogeneous perfusion of the liver. Severe intra hepatic biliary ductal dilatation with a distended gallbladder. 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 Partial cavernous transformation. Pancreas: Very extensive, expansile soft tissue, fat stranding, and cystic change throughout the pancreatic head, encasing multiple adjacent structures including the portal vein and central mesenteric vessels, the superior mesenteric artery, and portions of the descending duodenum (series 2, image 66). Maximum apparent cross-section of the pancreatic head is 6.4 x 5.3 cm (series 2, image 71). Severe atrophy of the distal  pancreatic parenchyma as well as diffuse ductal dilatation distally. Multiple fluid collections, including a collection overlying the pancreatic neck and body measuring 6.9 x 5.3 cm (series 2, image 69), and extending inferiorly from the lateral pancreatic head measuring 4.0 x 3.5 cm (series 2, image 77). Spleen:  Normal in size without significant abnormality. Adrenals/Urinary Tract: Adenomatous thickening of the adrenal glands, requiring no specific further follow-up or characterization. Kidneys are normal, without renal calculi, solid lesion, or hydronephrosis. Bladder is unremarkable. Stomach/Bowel: Stomach is within normal limits. Appendix appears normal. No evidence of bowel wall thickening, distention, or inflammatory changes. Vascular/Lymphatic: Aortic atherosclerosis. No enlarged abdominal or pelvic lymph nodes. Reproductive: Calcified uterine fibroids. Other: No abdominal wall hernia or abnormality. No ascites. Musculoskeletal: No acute osseous findings. IMPRESSION: 1. Very extensive, expansile soft tissue, fat stranding, and cystic change throughout the pancreatic head, encasing multiple adjacent structures including the portal vein and central mesenteric vessels, the superior mesenteric artery, and portions of the descending duodenum. Maximum apparent cross-section of the pancreatic head is 6.4 x 5.3 cm. 2. Severe intrahepatic biliary ductal dilatation with a distended gallbladder. 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 3. Multiple peripancreatic fluid collections, including a collection overlying the pancreatic neck and body measuring 6.9 x 5.3 cm, and extending inferiorly from the lateral pancreatic head measuring 4.0 x 3.5 cm, of uncertain acuity, consistent with acute pancreatic fluid collections or pseudocysts.  The presence or absence of infection within this fluid is not established by imaging. 4. Constellation of findings is of uncertain significance, highly concerning for pancreatic adenocarcinoma complicated by pancreatitis, however at least conceivably this could reflect benign, although unusually severe acute on chronic pancreatitis. 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 5. No evidence of lymphadenopathy or metastatic disease in the chest, abdomen, or pelvis. 6. Emphysema. 7. Coronary artery disease. Aortic Atherosclerosis (ICD10-I70.0) and Emphysema (ICD10-J43.9). Electronically Signed   By: Jearld Lesch M.D.   On: 12/08/2022 14:56       The results of significant diagnostics from this hospitalization (including imaging, microbiology, ancillary and laboratory) are listed below for reference.     Microbiology: Recent Results (from the past 240 hour(s))  Aerobic/Anaerobic Culture w Gram Stain (surgical/deep wound)     Status: None   Collection Time: 12/13/22  5:00 PM   Specimen: BILE  Result Value Ref Range Status   Specimen Description   Final    BILE Performed at [facility], [address] [place], [place] [address]    Special Requests   Final    NONE Performed at [facility], [address] [place], [place] [address]    Gram Stain NO WBC SEEN NO ORGANISMS SEEN   Final   Culture   Final    No growth aerobically or anaerobically. Performed at [facility], [address] [place], [place] [address]    Report Status 12/18/2022 FINAL  Final     Labs:  CBC: Recent Labs  Lab 12/14/22 0601 12/15/22 0556 12/16/22 0554 12/17/22 0655 12/18/22 0635  WBC 10.1 8.0 8.0 10.5 10.5  HGB 12.1 11.3* 13.2 11.1* 11.6*  HCT 36.4 33.3* 39.9 33.2* 33.4*  MCV 102.0* 99.7 100.0  99.4 96.3  PLT 352 348 459* 394 397   BMP &GFR Recent Labs  Lab 12/13/22 0611 12/14/22 0601 12/15/22 0556 12/16/22 0554 12/17/22 0655 12/17/22 0657  NA 133* 131* 131* 133*  --  131*  K 5.0 4.3 3.5 2.9*  --  3.6  CL 105 103 102 99  --  97*  CO2 23 22 24 26   --  25  GLUCOSE 144* 104* 104* 114*  --  113*  BUN 17 15 11  5*  --  <5*  CREATININE 0.76 0.58 0.50 0.46  --  0.37*  CALCIUM 7.7* 8.2* 7.8* 8.4*  --  7.8*  MG 3.3* 2.5* 2.2 2.0 1.8  --   PHOS  --  1.6*  --  2.6  --  2.9   Estimated Creatinine Clearance: 56.7 mL/min (A) (by C-G formula based on SCr of 0.37 mg/dL (L)). Liver & Pancreas: Recent Labs  Lab 12/13/22 0611 12/14/22 0601 12/15/22 0556 12/16/22 0554 12/17/22 0655 12/17/22 0657  AST 67* 46* 18 20 12*  --   ALT 26 36 24 22 14   --   ALKPHOS 188* 194* 183* 224* 173*  --   BILITOT 1.5* 1.3* 1.0 1.0 0.9  --   PROT 5.2* 4.9* 4.8* 5.8* 5.0*  --   ALBUMIN 2.4* 2.1* 2.0* 2.6*  2.6* 2.3* 2.4*   Recent Labs  Lab 12/11/22 1940 12/12/22 1851 12/13/22 0611 12/17/22 0655  LIPASE 36 31 29 31    No results for input(s): "AMMONIA" in the last 168 hours. Diabetic: No results for input(s): "HGBA1C" in the last 72 hours. Recent Labs  Lab 12/17/22 1159 12/17/22 1709 12/18/22 0032 12/18/22 0604 12/18/22 1128  GLUCAP 133* 126* 116* 94 99   Cardiac Enzymes: No results for input(s): "CKTOTAL", "CKMB", "CKMBINDEX", "TROPONINI" in the last 168 hours. No results for input(s): "PROBNP" in the last 8760 hours. Coagulation Profile: Recent Labs  Lab 12/13/22 0611  INR 1.3*   Thyroid Function Tests: No results for input(s): "TSH", "T4TOTAL", "FREET4", "T3FREE", "THYROIDAB" in the last 72 hours. Lipid Profile: No results for input(s): "CHOL", "HDL", "LDLCALC", "TRIG", "CHOLHDL", "LDLDIRECT" in the last 72 hours. Anemia Panel: No results for input(s): "VITAMINB12", "FOLATE", "FERRITIN", "TIBC", "IRON", "RETICCTPCT" in the last 72 hours. Urine analysis:    Component  Value Date/Time   COLORURINE YELLOW 07/24/2021 0827   APPEARANCEUR HAZY (A) 07/24/2021 0827   LABSPEC 1.019 07/24/2021 0827   PHURINE 5.0 07/24/2021 0827   GLUCOSEU NEGATIVE 07/24/2021 0827   HGBUR NEGATIVE 07/24/2021 0827   BILIRUBINUR NEGATIVE 07/24/2021 0827   KETONESUR NEGATIVE 07/24/2021 0827   PROTEINUR TRACE (A) 07/24/2021 0827   UROBILINOGEN 0.2 01/23/2010 1750   NITRITE NEGATIVE 07/24/2021 0827   LEUKOCYTESUR NEGATIVE 07/24/2021 0827   Sepsis Labs: Invalid input(s): "PROCALCITONIN", "LACTICIDVEN"   SIGNED:  Almon Hercules, MD  [facility] 12/18/2022, 4:20 PM

## 2022-12-18 NOTE — Progress Notes (Signed)
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   No Known Allergies  Patient Measurements: Height: 5\' 3"  (160 cm) Weight: 49.9 kg (110 lb) IBW/kg (Calculated) : 52.4 Heparin Dosing Weight:   Vital Signs: Temp: 98.6 F (37 C) (11/21 2149) Temp Source: Oral (11/21 2149) BP: 120/69 (11/21 2149) Pulse Rate: 89 (11/21 2149)  Labs: Recent Labs    12/15/22 0556 12/15/22 1144 12/16/22 0554 12/17/22 0655 12/17/22 0657 12/17/22 1556 12/17/22 2326  HGB 11.3*  --  13.2 11.1*  --   --   --   HCT 33.3*  --  39.9 33.2*  --   --   --   PLT 348  --  459* 394  --   --   --   HEPARINUNFRC 0.37   < > 0.40 0.25*  --  0.27* 0.63  CREATININE 0.50  --  0.46  --  0.37*  --   --    < > = values in this interval not displayed.    Estimated Creatinine Clearance: 56.7 mL/min (A) (by C-G formula based on SCr of 0.37 mg/dL (L)).   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  Goal of Therapy:  Heparin level 0.3-0.7 units/ml Monitor platelets by anticoagulation protocol: Yes   Plan: Con't Heparin 1300 units/hr Daily HL and CBC   Harrison Zetina S. Merilynn Finland, PharmD, BCPS Clinical Staff Pharmacist [email or web address] Merilynn Finland, Orlando Thalmann Stillinger 12/18/2022,12:05 AM

## 2022-12-18 NOTE — Discharge Instructions (Addendum)
Information on my medicine - ELIQUIS (apixaban)  This medication education was reviewed with me or my healthcare representative as part of my discharge preparation.  Why was Eliquis prescribed for you? Eliquis was prescribed to treat blood clots that may have been found in the veins of your legs (deep vein thrombosis) or in your lungs (pulmonary embolism) and to reduce the risk of them occurring again.  What do You need to know about Eliquis ? The starting dose is 10 mg (two 5 mg tablets) taken TWICE daily for the FIRST SEVEN (7) DAYS, then on 12/25/2022  the dose is reduced to ONE 5 mg tablet taken TWICE daily.  Eliquis may be taken with or without food.   Try to take the dose about the same time in the morning and in the evening. If you have difficulty swallowing the tablet whole please discuss with your pharmacist how to take the medication safely.  Take Eliquis exactly as prescribed and DO NOT stop taking Eliquis without talking to the doctor who prescribed the medication.  Stopping may increase your risk of developing a new blood clot.  Refill your prescription before you run out.  After discharge, you should have regular check-up appointments with your healthcare provider that is prescribing your Eliquis.    What do you do if you miss a dose? If a dose of ELIQUIS is not taken at the scheduled time, take it as soon as possible on the same day and twice-daily administration should be resumed. The dose should not be doubled to make up for a missed dose.  Important Safety Information A possible side effect of Eliquis is bleeding. You should call your healthcare provider right away if you experience any of the following: Bleeding from an injury or your nose that does not stop. Unusual colored urine (red or dark brown) or unusual colored stools (red or black). Unusual bruising for unknown reasons. A serious fall or if you hit your head (even if there is no bleeding).  Some  medicines may interact with Eliquis and might increase your risk of bleeding or clotting while on Eliquis. To help avoid this, consult your healthcare provider or pharmacist prior to using any new prescription or non-prescription medications, including herbals, vitamins, non-steroidal anti-inflammatory drugs (NSAIDs) and supplements.  This website has more information on Eliquis (apixaban): http://www.eliquis.com/eliquis/home    Interventional Radiology Percutaneous Abscess Drain Placement After Care   This sheet gives you information about how to care for yourself after your procedure. Your health care provider may also give you more specific instructions. Your drain was placed by an interventional radiologist with [facility]. If you have questions or concerns, contact [facility] at [phone].   What is a percutaneous drain?   A drain is a small plastic tube (catheter) that goes into the fluid collection in your body through your skin.   How long will I need the drain?   How long the drain needs to stay in is determined by where the drain is, how much comes out of the drain each day and if you are having any other surgical procedures.   Interventional radiology will determine when it is time to remove the drain. It is important to follow up as directed so that the drain can be removed as soon as it is safe to do so.   What can I expect after the procedure?   After the procedure, it is common to have:   A small amount of bruising and  discomfort in the area where the drainage tube (catheter) was placed.   Sleepiness and fatigue. This should go away after the medicines you were given have worn off.   Follow these instructions at home:   Insertion site care   Check your insertion site when you change the bandage. Check for:   More redness, swelling, or pain.   More fluid or blood.   Warmth.   Pus or a bad smell.   When caring for your insertion site:    Wash your hands with soap and water for at least 20 seconds before and after you change your bandage (dressing). If soap and water are not available, use hand sanitizer.   You do not need to change your dressing everyday if it is clean and dry. Change your dressing every 3 days or as needed when it is soiled, wet or becoming dislodged. You will need to change your dressing each time you shower.   Leave stitches (sutures), skin glue, or adhesive strips in place. These skin closures may need to stay in place for 2 weeks or longer. If adhesive strip edges start to loosen and curl up, you may trim the loose edges. Do not remove adhesive strips completely unless your health care provider tells you to do so.   Catheter care   Flush the catheter once per day with 5 mL of 0.9% normal saline unless you are told otherwise by your healthcare provider. This helps to prevent clogs in the catheter.   To disconnect the drain, turn the clear plastic tube to the left. Attach the saline syringe by placing it on the white end of the drain and turning gently to the right. Once attached gently push the plunger to the 5 mL mark. After you are done flushing, disconnect the syringe by turning to the left and reattach your drainage container   If you have a bulb please be sure the bulb is charged after reconnecting it - to do this pinch the bulb between your thumb and first finger and close the stopper located on the top of the bulb.    Check for fluid leaking from around your catheter (instead of fluid draining through your catheter). This may be a sign that the drain is no longer working correctly.   Write down the following information every time you empty your bag:   The date and time.   The amount of drainage.   Activity   Rest at home for 1-2 days after your procedure.   For the first 48 hours do not lift anything more than 10 lbs (about a gallon of milk). You may perform moderate activities/exercise.  Please avoid strenuous activities during this time.   Avoid any activities which may pull on your drain as this can cause your drain to become dislodged.   If you were given a sedative during the procedure, it can affect you for several hours. Do not drive or operate machinery until your health care provider says that it is safe.   General instructions   For mild pain take over-the-counter medications as needed for pain such as Tylenol or Advil. If you are experiencing severe pain please call our office as this may indicate an issue with your drain.    If you were prescribed an antibiotic medicine, take it as told by your health care provider. Do not stop using the antibiotic even if you start to feel better.   You may shower 24 hours after the drain  is placed. To do this cover the insertion site with a water tight material such as saran wrap and seal the edges with tape, you may also purchase waterproof dressings at your local drug store. Shower as usual and then remove the water tight dressing and any gauze/tape underneath it once you have exited the shower and dried off. Allow the area to air dry or pat dry with a clean towel. Once the skin is completely dry place a new gauze dressing. It is important to keep the site dry at all times to prevent infection.   Do not submerge the drain - this means you cannot take baths, swim, use a hot tub, etc. until the drain is removed.    Do not use any products that contain nicotine or tobacco, such as cigarettes, e-cigarettes, and chewing tobacco. If you need help quitting, ask your health care provider.   Keep all follow-up visits as told by your health care provider. This is important.   Contact a health care provider if:   You have less than 10 mL of drainage a day for 2-3 days in a row, or as directed by your health care provider.   You have any of these signs of infection:   More redness, swelling, or pain around your incision area.   More  fluid or blood coming from your incision area.   Warmth coming from your incision area.   Pus or a bad smell coming from your incision area.   You have fluid leaking from around your catheter (instead of through your catheter).   You are unable to flush the drain.   You have a fever or chills.   You have pain that does not get better with medicine.   You have not been contacted to schedule a drain follow up appointment within 10 days of discharge from the hospital.   Please call [facility] at [phone] with any questions or concerns.   Get help right away if:   Your catheter comes out.   You suddenly stop having drainage from your catheter.   You suddenly have blood in the fluid that is draining from your catheter.   You become dizzy or you faint.   You develop a rash.   You have nausea or vomiting.   You have difficulty breathing or you feel short of breath.   You develop chest pain.   You have problems with your speech or vision.   You have trouble balancing or moving your arms or legs.   Summary   It is common to have a small amount of bruising and discomfort in the area where the drainage tube (catheter) was placed. You may also have minor discomfort with movement while the drain is in place.   Flush the drain once per day with 5 mL of 0.9% normal saline (unless you were told otherwise by your healthcare provider).    Record the amount of drainage from the bag every time you empty it.   Change the dressing every 3 days or earlier if soiled/wet. Keep the skin dry under the dressing.   You may shower with the drain in place. Do not submerge the drain (no baths, swimming, hot tubs, etc.).   Contact [facility] at [phone] if you have more redness, swelling, or pain around your incision area or if you have pain that does not get better with medicine.   This information is not intended to replace advice given to you  by your health  care provider. Make sure you discuss any questions you have with your health care provider.   Document Revised: 04/17/2021 Document Reviewed: 01/07/2019   Elsevier Patient Education  2023 [employer]         Interventional Radiology Drain Record   Empty your drain at least once per day. You may empty it as often as needed. Use this form to write down the amount of fluid that has collected in the drainage container. Bring this form with you to your follow-up visits. Please call [facility] at [phone] with any questions or concerns prior to your appointment.   Drain #1 location: ___________________   Date __________ Time __________ Amount __________   Date __________ Time __________ Amount __________   Date __________ Time __________ Amount __________   Date __________ Time __________ Amount __________   Date __________ Time __________ Amount __________   Date __________ Time __________ Amount __________   Date __________ Time __________ Amount __________   Date __________ Time __________ Amount __________   Date __________ Time __________ Amount __________   Date __________ Time __________ Amount __________   Date __________ Time __________ Amount __________   Date __________ Time __________ Amount __________   Date __________ Time __________ Amount __________   Date __________ Time __________ Amount __________

## 2022-12-21 ENCOUNTER — Telehealth: Payer: Self-pay

## 2022-12-21 LAB — CYTOLOGY - NON PAP

## 2022-12-21 NOTE — Transitions of Care (Post Inpatient/ED Visit) (Signed)
12/21/2022  Name: Kathryn Underwood MRN: [id number] DOB: Dec 06, 1960  Today's TOC FU Call Status: Today's TOC FU Call Status:: Successful TOC FU Call Completed TOC FU Call Complete Date: 12/18/22 Patient's Name and Date of Birth confirmed.  Transition Care Management Follow-up Telephone Call Date of Discharge: 12/18/22 Discharge Facility: [facility]) Type of Discharge: Inpatient Admission Primary Inpatient Discharge Diagnosis:: dx of pancreas How have you been since you were released from the hospital?: Same Any questions or concerns?: Yes  Items Reviewed: Did you receive and understand the discharge instructions provided?: Yes Medications obtained,verified, and reconciled?: Yes (Medications Reviewed) Any new allergies since your discharge?: No Dietary orders reviewed?: Yes Do you have support at home?: Yes People in Home: significant other  Medications Reviewed Today: Medications Reviewed Today     Reviewed by Karena Addison, LPN (Licensed Practical Nurse) on 12/21/22 at 1123  Med List Status: <None>   Medication Order Taking? Sig Documenting Provider Last Dose Status Informant  albuterol (VENTOLIN HFA) 108 (90 Base) MCG/ACT inhaler [id number] No Inhale 2 puffs into the lungs every 6 (six) hours as needed for wheezing or shortness of breath.  Patient not taking: Reported on 12/08/2022   Burnadette Pop, MD Not Taking Active Self  apixaban (ELIQUIS) 5 MG TABS tablet [id number]  Take 2 tablets (10 mg total) by mouth 2 (two) times daily for 7 days, THEN 1 tablet (5 mg total) 2 (two) times daily for 21 days. Almon Hercules, MD  Active   APIXABAN Everlene Balls) VTE STARTER PACK (10MG  AND 5MG ) [id number]  Take as directed on package: start with two-5mg  tablets twice daily for 7 days. On day 8, switch to one-5mg  tablet twice daily. Almon Hercules, MD  Active   Ensure Max Protein (ENSURE MAX PROTEIN) LIQD [id number]  Take 330 mLs (11 oz total) by mouth 2 (two) times daily. Almon Hercules, MD   Active   folic acid (FOLVITE) 1 MG tablet [id number] No Take 1 tablet (1 mg total) by mouth daily.  Patient not taking: Reported on 12/08/2022   Burnadette Pop, MD Not Taking Active Self  gabapentin (NEURONTIN) 300 MG capsule [id number] No Take 2 capsules (600 mg total) by mouth 2 (two) times daily.  Patient taking differently: Take 900 mg by mouth at bedtime.   Rolan Bucco, MD 12/07/2022 Active Self  mirtazapine (REMERON) 45 MG tablet [id number] No Take 45 mg by mouth at bedtime. [provider] Past Week Active Self  mometasone-formoterol (DULERA) 200-5 MCG/ACT AERO [id number] No Inhale 2 puffs into the lungs 2 (two) times daily.  Patient not taking: Reported on 12/08/2022   Burnadette Pop, MD Not Taking Active Self  Multiple Vitamin (MULTIVITAMIN WITH MINERALS) TABS tablet [id number]  Take 1 tablet by mouth daily. Almon Hercules, MD  Active   oxyCODONE (OXY IR/ROXICODONE) 5 MG immediate release tablet [id number]  Take 1 tablet (5 mg total) by mouth every 6 (six) hours as needed for up to 5 days for moderate pain (pain score 4-6) or severe pain (pain score 7-10). Almon Hercules, MD  Active   pantoprazole (PROTONIX) 40 MG tablet [id number]  Take 1 tablet (40 mg total) by mouth 2 (two) times daily. Almon Hercules, MD  Active   QUEtiapine (SEROQUEL) 100 MG tablet [id number] No Take 100-200 mg by mouth at bedtime. [provider] 12/07/2022 Active Self  sodium chloride flush (NS) 0.9 % SOLN [id number]  Use 5 - 10 mLs to flush abdominal drain once daily as directed  Allred, Darrell K, PA-C  Active   sucralfate (CARAFATE) 1 g tablet [id number]  Take 1 tablet (1 g total) by mouth 4 (four) times daily for 24 days. Almon Hercules, MD  Active   thiamine 100 MG tablet [id number] No Take 1 tablet (100 mg total) by mouth daily.  Patient not taking: Reported on 09/12/2021   Burnadette Pop, MD Not Taking Active Self  valACYclovir (VALTREX) 1000 MG tablet [id number]  Take 1 tablet (1,000 mg total) by  mouth 2 (two) times daily for 7 days. Almon Hercules, MD  Active             Home Care and Equipment/Supplies: Were Home Health Services Ordered?: NA Any new equipment or medical supplies ordered?: NA  Functional Questionnaire: Do you need assistance with bathing/showering or dressing?: Yes Do you need assistance with meal preparation?: Yes Do you need assistance with eating?: No Do you have difficulty maintaining continence: No Do you need assistance with getting out of bed/getting out of a chair/moving?: Yes Do you have difficulty managing or taking your medications?: No  Follow up appointments reviewed: PCP Follow-up appointment confirmed?: Yes Date of PCP follow-up appointment?: 12/23/22 Follow-up Provider: [facility] Follow-up appointment confirmed?: No Reason Specialist Follow-Up Not Confirmed: Patient has Specialist Provider Number and will Call for Appointment Do you need transportation to your follow-up appointment?: No Do you understand care options if your condition(s) worsen?: Yes-patient verbalized understanding    SIGNATURE Karena Addison, LPN [facility] Nurse Health Advisor Direct Dial [phone]

## 2022-12-22 ENCOUNTER — Encounter: Payer: Self-pay | Admitting: Gastroenterology

## 2022-12-22 ENCOUNTER — Telehealth: Payer: Self-pay | Admitting: Hematology

## 2022-12-22 NOTE — Progress Notes (Signed)
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  Although additional [facility] and [facility] testing will be returning, now that we have a diagnosis of adenocarcinoma, it does not significantly impact her next steps in her evaluation and treatment.  We will go ahead and scan these into the chart and make a copy for her and mail that to her for her records though.   Corliss Parish, MD [facility] Office # [phone]

## 2022-12-23 ENCOUNTER — Other Ambulatory Visit: Payer: Self-pay

## 2022-12-23 ENCOUNTER — Other Ambulatory Visit: Payer: Self-pay | Admitting: *Deleted

## 2022-12-23 ENCOUNTER — Telehealth: Payer: Self-pay

## 2022-12-23 ENCOUNTER — Inpatient Hospital Stay (HOSPITAL_BASED_OUTPATIENT_CLINIC_OR_DEPARTMENT_OTHER)
Admission: EM | Admit: 2022-12-23 | Discharge: 2022-12-27 | DRG: 438 | Disposition: A | Discharge status: Home / Self Care | Payer: Medicaid Other | Attending: Internal Medicine | Admitting: Internal Medicine

## 2022-12-23 ENCOUNTER — Encounter: Payer: Self-pay | Admitting: Physician Assistant

## 2022-12-23 ENCOUNTER — Ambulatory Visit: Payer: Medicaid Other | Admitting: Physician Assistant

## 2022-12-23 ENCOUNTER — Encounter (HOSPITAL_BASED_OUTPATIENT_CLINIC_OR_DEPARTMENT_OTHER): Payer: Self-pay | Admitting: Emergency Medicine

## 2022-12-23 ENCOUNTER — Emergency Department (HOSPITAL_BASED_OUTPATIENT_CLINIC_OR_DEPARTMENT_OTHER): Payer: Medicaid Other

## 2022-12-23 VITALS — BP 94/64 | HR 119 | Temp 97.5°F | Ht 63.0 in | Wt 100.0 lb

## 2022-12-23 DIAGNOSIS — B029 Zoster without complications: Secondary | ICD-10-CM

## 2022-12-23 DIAGNOSIS — K820 Obstruction of gallbladder: Secondary | ICD-10-CM | POA: Diagnosis not present

## 2022-12-23 DIAGNOSIS — K269 Duodenal ulcer, unspecified as acute or chronic, without hemorrhage or perforation: Secondary | ICD-10-CM | POA: Diagnosis present

## 2022-12-23 DIAGNOSIS — Z7951 Long term (current) use of inhaled steroids: Secondary | ICD-10-CM | POA: Diagnosis not present

## 2022-12-23 DIAGNOSIS — Z7901 Long term (current) use of anticoagulants: Secondary | ICD-10-CM | POA: Diagnosis not present

## 2022-12-23 DIAGNOSIS — F419 Anxiety disorder, unspecified: Secondary | ICD-10-CM | POA: Diagnosis present

## 2022-12-23 DIAGNOSIS — I82890 Acute embolism and thrombosis of other specified veins: Secondary | ICD-10-CM | POA: Diagnosis present

## 2022-12-23 DIAGNOSIS — C250 Malignant neoplasm of head of pancreas: Secondary | ICD-10-CM | POA: Diagnosis not present

## 2022-12-23 DIAGNOSIS — Z8719 Personal history of other diseases of the digestive system: Secondary | ICD-10-CM

## 2022-12-23 DIAGNOSIS — E43 Unspecified severe protein-calorie malnutrition: Secondary | ICD-10-CM | POA: Diagnosis present

## 2022-12-23 DIAGNOSIS — R6510 Systemic inflammatory response syndrome (SIRS) of non-infectious origin without acute organ dysfunction: Secondary | ICD-10-CM | POA: Diagnosis present

## 2022-12-23 DIAGNOSIS — I748 Embolism and thrombosis of other arteries: Secondary | ICD-10-CM | POA: Diagnosis present

## 2022-12-23 DIAGNOSIS — Z8507 Personal history of malignant neoplasm of pancreas: Secondary | ICD-10-CM

## 2022-12-23 DIAGNOSIS — Z681 Body mass index (BMI) 19 or less, adult: Secondary | ICD-10-CM

## 2022-12-23 DIAGNOSIS — K311 Adult hypertrophic pyloric stenosis: Secondary | ICD-10-CM | POA: Diagnosis present

## 2022-12-23 DIAGNOSIS — Z79899 Other long term (current) drug therapy: Secondary | ICD-10-CM

## 2022-12-23 DIAGNOSIS — F32A Depression, unspecified: Secondary | ICD-10-CM | POA: Diagnosis present

## 2022-12-23 DIAGNOSIS — K209 Esophagitis, unspecified without bleeding: Secondary | ICD-10-CM | POA: Diagnosis not present

## 2022-12-23 DIAGNOSIS — J449 Chronic obstructive pulmonary disease, unspecified: Secondary | ICD-10-CM | POA: Diagnosis present

## 2022-12-23 DIAGNOSIS — K2210 Ulcer of esophagus without bleeding: Secondary | ICD-10-CM | POA: Diagnosis present

## 2022-12-23 DIAGNOSIS — G629 Polyneuropathy, unspecified: Secondary | ICD-10-CM | POA: Diagnosis not present

## 2022-12-23 DIAGNOSIS — K859 Acute pancreatitis without necrosis or infection, unspecified: Secondary | ICD-10-CM | POA: Diagnosis present

## 2022-12-23 DIAGNOSIS — I81 Portal vein thrombosis: Secondary | ICD-10-CM

## 2022-12-23 DIAGNOSIS — C259 Malignant neoplasm of pancreas, unspecified: Secondary | ICD-10-CM

## 2022-12-23 DIAGNOSIS — K315 Obstruction of duodenum: Secondary | ICD-10-CM | POA: Diagnosis present

## 2022-12-23 DIAGNOSIS — B999 Unspecified infectious disease: Secondary | ICD-10-CM | POA: Diagnosis present

## 2022-12-23 DIAGNOSIS — E46 Unspecified protein-calorie malnutrition: Secondary | ICD-10-CM | POA: Diagnosis not present

## 2022-12-23 DIAGNOSIS — D485 Neoplasm of uncertain behavior of skin: Secondary | ICD-10-CM

## 2022-12-23 DIAGNOSIS — R109 Unspecified abdominal pain: Secondary | ICD-10-CM | POA: Diagnosis present

## 2022-12-23 DIAGNOSIS — Z825 Family history of asthma and other chronic lower respiratory diseases: Secondary | ICD-10-CM | POA: Diagnosis not present

## 2022-12-23 DIAGNOSIS — R1110 Vomiting, unspecified: Secondary | ICD-10-CM | POA: Diagnosis not present

## 2022-12-23 DIAGNOSIS — K861 Other chronic pancreatitis: Secondary | ICD-10-CM | POA: Diagnosis present

## 2022-12-23 DIAGNOSIS — F17210 Nicotine dependence, cigarettes, uncomplicated: Secondary | ICD-10-CM | POA: Diagnosis present

## 2022-12-23 DIAGNOSIS — F418 Other specified anxiety disorders: Secondary | ICD-10-CM | POA: Diagnosis present

## 2022-12-23 HISTORY — DX: Malignant neoplasm of pancreas, unspecified: C25.9

## 2022-12-23 LAB — CBC WITH DIFFERENTIAL/PLATELET
Abs Immature Granulocytes: 0.08 10*3/uL — ABNORMAL HIGH (ref 0.00–0.07)
Basophils Absolute: 0.1 10*3/uL (ref 0.0–0.1)
Basophils Absolute: 0.1 10*3/uL (ref 0.0–0.1)
Basophils Relative: 0.8 % (ref 0.0–3.0)
Basophils Relative: 1 %
Eosinophils Absolute: 0.2 10*3/uL (ref 0.0–0.7)
Eosinophils Absolute: 0.2 10*3/uL (ref 0.0–0.5)
Eosinophils Relative: 1.1 % (ref 0.0–5.0)
Eosinophils Relative: 1 %
HCT: 38.7 % (ref 36.0–46.0)
HCT: 34.7 % — ABNORMAL LOW (ref 36.0–46.0)
Hemoglobin: 12.8 g/dL (ref 12.0–15.0)
Hemoglobin: 12 g/dL (ref 12.0–15.0)
Immature Granulocytes: 1 %
Lymphocytes Relative: 6.9 % — ABNORMAL LOW (ref 12.0–46.0)
Lymphocytes Relative: 8 %
Lymphs Abs: 1.3 10*3/uL (ref 0.7–4.0)
Lymphs Abs: 1.4 10*3/uL (ref 0.7–4.0)
MCH: 32.7 pg (ref 26.0–34.0)
MCHC: 33.1 g/dL (ref 30.0–36.0)
MCHC: 34.6 g/dL (ref 30.0–36.0)
MCV: 100.2 fL — ABNORMAL HIGH (ref 78.0–100.0)
MCV: 94.6 fL (ref 80.0–100.0)
Monocytes Absolute: 0.8 10*3/uL (ref 0.1–1.0)
Monocytes Absolute: 1 10*3/uL (ref 0.1–1.0)
Monocytes Relative: 4.6 % (ref 3.0–12.0)
Monocytes Relative: 6 %
Neutro Abs: 15.8 10*3/uL — ABNORMAL HIGH (ref 1.4–7.7)
Neutro Abs: 14.7 10*3/uL — ABNORMAL HIGH (ref 1.7–7.7)
Neutrophils Relative %: 86.6 % — ABNORMAL HIGH (ref 43.0–77.0)
Neutrophils Relative %: 83 %
Platelets: 760 10*3/uL — ABNORMAL HIGH (ref 150.0–400.0)
Platelets: 679 10*3/uL — ABNORMAL HIGH (ref 150–400)
RBC: 3.86 Mil/uL — ABNORMAL LOW (ref 3.87–5.11)
RBC: 3.67 MIL/uL — ABNORMAL LOW (ref 3.87–5.11)
RDW: 14.2 % (ref 11.5–15.5)
RDW: 13.7 % (ref 11.5–15.5)
WBC: 18.3 10*3/uL (ref 4.0–10.5)
WBC: 17.4 10*3/uL — ABNORMAL HIGH (ref 4.0–10.5)
nRBC: 0 % (ref 0.0–0.2)

## 2022-12-23 LAB — COMPREHENSIVE METABOLIC PANEL
ALT: 13 U/L (ref 0–35)
ALT: 12 U/L (ref 0–44)
AST: 19 U/L (ref 0–37)
AST: 19 U/L (ref 15–41)
Albumin: 3.3 g/dL — ABNORMAL LOW (ref 3.5–5.2)
Albumin: 3.1 g/dL — ABNORMAL LOW (ref 3.5–5.0)
Alkaline Phosphatase: 611 U/L — ABNORMAL HIGH (ref 39–117)
Alkaline Phosphatase: 515 U/L — ABNORMAL HIGH (ref 38–126)
Anion gap: 11 (ref 5–15)
BUN: 12 mg/dL (ref 6–23)
BUN: 13 mg/dL (ref 8–23)
CO2: 32 meq/L (ref 19–32)
CO2: 31 mmol/L (ref 22–32)
Calcium: 9.4 mg/dL (ref 8.4–10.5)
Calcium: 9.2 mg/dL (ref 8.9–10.3)
Chloride: 95 meq/L — ABNORMAL LOW (ref 96–112)
Chloride: 93 mmol/L — ABNORMAL LOW (ref 98–111)
Creatinine, Ser: 0.48 mg/dL (ref 0.40–1.20)
Creatinine, Ser: 0.43 mg/dL — ABNORMAL LOW (ref 0.44–1.00)
GFR, Estimated: >60 mL/min (ref >60)
GFR: 100.58 mL/min (ref >60.00)
Glucose, Bld: 116 mg/dL — ABNORMAL HIGH (ref 70–99)
Glucose, Bld: 108 mg/dL — ABNORMAL HIGH (ref 70–99)
Potassium: 4 meq/L (ref 3.5–5.1)
Potassium: 3.7 mmol/L (ref 3.5–5.1)
Sodium: 134 meq/L — ABNORMAL LOW (ref 135–145)
Sodium: 135 mmol/L (ref 135–145)
Total Bilirubin: 0.8 mg/dL (ref 0.2–1.2)
Total Bilirubin: 0.9 mg/dL (ref <1.2)
Total Protein: 5.9 g/dL — ABNORMAL LOW (ref 6.0–8.3)
Total Protein: 5.7 g/dL — ABNORMAL LOW (ref 6.5–8.1)

## 2022-12-23 LAB — LACTIC ACID, PLASMA: Lactic Acid, Venous: 0.8 mmol/L (ref 0.5–1.9)

## 2022-12-23 LAB — URINALYSIS, ROUTINE W REFLEX MICROSCOPIC
Bilirubin Urine: NEGATIVE
Glucose, UA: NEGATIVE mg/dL
Hgb urine dipstick: NEGATIVE
Ketones, ur: 15 mg/dL — AB
Leukocytes,Ua: NEGATIVE
Nitrite: NEGATIVE
Specific Gravity, Urine: >1.046 — ABNORMAL HIGH (ref 1.005–1.030)
pH: 8 (ref 5.0–8.0)

## 2022-12-23 LAB — AMYLASE: Amylase: 36 U/L (ref 28–100)

## 2022-12-23 LAB — LIPASE, BLOOD: Lipase: 43 U/L (ref 11–51)

## 2022-12-23 MED ORDER — OXYCODONE HCL 5 MG PO TABS: 5.0000 mg | ORAL_TABLET | Freq: Four times a day (QID) | ORAL | 0 refills | Status: DC | PRN

## 2022-12-23 MED ORDER — SODIUM CHLORIDE 0.9 % IV BOLUS
1000.0000 mL | Freq: Once | INTRAVENOUS | Status: AC
  Administered 2022-12-23: 1000 mL via INTRAVENOUS

## 2022-12-23 MED ORDER — IOHEXOL 300 MG/ML  SOLN
100.0000 mL | Freq: Once | INTRAMUSCULAR | Status: AC | PRN
  Administered 2022-12-23: 85 mL via INTRAVENOUS

## 2022-12-23 MED ORDER — VANCOMYCIN HCL IN DEXTROSE 1-5 GM/200ML-% IV SOLN
1000.0000 mg | Freq: Once | INTRAVENOUS | Status: AC
Start: 2022-12-23 — End: 2022-12-23
  Administered 2022-12-23: 1000 mg via INTRAVENOUS
  Filled 2022-12-23: qty 200

## 2022-12-23 MED ORDER — MORPHINE SULFATE (PF) 4 MG/ML IV SOLN
4.0000 mg | Freq: Once | INTRAVENOUS | Status: AC
  Administered 2022-12-23: 4 mg via INTRAVENOUS
  Filled 2022-12-23: qty 1

## 2022-12-23 MED ORDER — PIPERACILLIN-TAZOBACTAM 3.375 G IVPB 30 MIN
3.3750 g | Freq: Once | INTRAVENOUS | Status: AC
  Administered 2022-12-23: 3.375 g via INTRAVENOUS
  Filled 2022-12-23: qty 50

## 2022-12-23 MED ORDER — SUCRALFATE 1 GM/10ML PO SUSP: 1.0000 g | Freq: Three times a day (TID) | ORAL | 1 refills | Status: DC

## 2022-12-23 NOTE — ED Triage Notes (Signed)
States oncologist called her and told to come to ED d/t her WBC being "dangerously low." Hx of pancreatic cancer. Currently has gallbladder drain that is newly placed.  Reports extremely weak, nausea, and lower back pain. Has not started cancer treatments yet.

## 2022-12-23 NOTE — Plan of Care (Signed)
Discuss and review plan of care with patient/family  

## 2022-12-23 NOTE — ED Notes (Signed)
Kathryn Underwood Ward  [phone] family

## 2022-12-23 NOTE — Patient Instructions (Signed)
Recheck CBC and CMP today.   Follow up with me in 4 weeks.  Follow with specialists as scheduled.  Referral to dermatology.  Medications refilled.  Liquid only diet.  Back to ER this weekend if any acute worse / change in symptoms.

## 2022-12-23 NOTE — Progress Notes (Signed)
ED Pharmacy Antibiotic Sign Off An antibiotic consult was received from an ED provider for vancomycin and zosyn per pharmacy dosing for  recent percutaneous cholecystotomy, +SIRS, WBC 17 . A chart review was completed to assess appropriateness.  The following one time order(s) were placed per pharmacy consult:  vancomycin 1000 mg x 1 dose zosyn 3.375g x 1 dose  Further antibiotic and/or antibiotic pharmacy consults should be ordered by the admitting provider if indicated.   Thank you for allowing pharmacy to be a part of this patient's care.   Delmar Landau, PharmD, BCPS 12/23/2022 4:55 PM ED Clinical Pharmacist -  [phone]

## 2022-12-23 NOTE — Progress Notes (Signed)
Chief Complaint:  Kathryn Underwood is a 62 y.o. female who presents today for a TCM visit.   Subjective:  HPI:  Summary of Hospital admission: Reason for admission: Pancreatic mass Date of admission: 12/08/22 Date of discharge: 12/18/22 Date of Interactive contact: 12/21/22  Summary of Hospital course:  62 year old F with PMH of EtOH pancreatitis with pseudocyst, alcohol use disorder, neuropathy, anxiety, depression, GERD and tobacco use disorder, presented to ER with decreased oral intake, fatigue and unintentional weight loss for about 2 months and found to have pancreatic mass on imaging.  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  EUS not successful on 11/14 due to foodstuff in stomach.   Patient underwent EGD/EUS on 11/16.  After procedure, she has significant abdominal pain for which she received IV fentanyl twice while in PACU.  Patient continued to have significant right-sided abdominal pain despite pain medications.  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  IR consulted and patient underwent percutaneous cholecystostomy on 11/17.     Pancreatic biopsy confirmed adenocarcinoma.  Oncology consulted and recommended CT chest for staging, port a cath placement and outpatient follow-up for treatment.  She had a port a cath placed on the day of discharge.  Cleared for discharge by IR, oncology and gastroenterology for outpatient follow-up.  GI recommended not advancing beyond full liquid diet until outpatient follow-up.   Pt reported a skin rash over her backside.  Exam concerning for shingles.  She reports prior history of this.  Started on Valtrex 1 g  twice daily for 7 days.  Reassess at follow-up.  Interim history:  Pt reports severe esophageal pain and difficulty swallowing large pills. She reports that the pain was so severe that it kept her up all night. Despite being on a full liquid diet, she vomited a large amount, which she believes was due to consuming grits. She also mentions having a soft bowel movement after two days of constipation. Kathryn Underwood has been managing her pain with oxycodone, taking one 5mg  pill at a time as needed. She also has a gallbladder drain, which she has been flushing and monitoring with the help of her partner, Kathryn Underwood. She expresses concern about the drain, particularly about changing the bandage and keeping it dry during showers.      ROS: Esophageal pain and vomiting (seems resolved since this morning), otherwise a complete review of systems was negative.   PMH:  The following were reviewed and entered/updated in epic: Past Medical History:  Diagnosis Date   Alcohol abuse    Anxiety    Chickenpox    Depression    Neuromuscular disorder (HCC)    neuropathy   Substance abuse (HCC)    Patient Active Problem List   Diagnosis Date Noted   Pancreatic adenocarcinoma (HCC) 12/16/2022   Biliary obstruction 12/15/2022   Gastric outlet obstruction 12/15/2022   High serum carbohydrate antigen 19-9 (CA19-9) 12/12/2022   Pancreatic cyst 12/12/2022   Abnormal CT scan, gastrointestinal tract 12/11/2022   Acute esophagitis 12/10/2022   Alcohol-induced chronic pancreatitis (HCC) 12/09/2022   Protein malnutrition (HCC) 12/09/2022   Unintentional weight loss 12/09/2022   Esophageal dysphagia 12/09/2022   Dilation of pancreatic duct 12/09/2022   Protein-calorie malnutrition, severe 12/09/2022   Pancreatic  mass 12/08/2022   Multifocal atrial tachycardia (HCC) 08/31/2021   Delirium tremens (HCC) 07/27/2021   Pancreas hemorrhage 07/27/2021   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   Uterine fibroid 07/27/2021    COPD (chronic obstructive pulmonary disease) (HCC) 07/27/2021   Insomnia 07/27/2021   Pulmonary nodule 07/25/2021   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/25/2021   Alcohol induced acute pancreatitis without necrosis or infection 10/05/2017   Alcoholic ketoacidosis 10/04/2017   Epigastric pain 10/04/2017   MDD (major depressive disorder), recurrent severe, 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   Alcohol withdrawal (HCC) 04/25/2016   Sinus tachycardia 04/25/2016   Transaminitis 04/25/2016   Leukocytosis 04/25/2016   Closed nondisplaced fracture of fifth right metatarsal bone 04/08/2016   Acute pain of right shoulder 04/08/2016   Vitamin D deficiency 12/11/2015   Alcohol dependence with unspecified alcohol-induced disorder (HCC) 12/09/2015   Fatigue 12/09/2015   Encounter for alcohol abuse counseling and surveillance 12/09/2015   Cough 01/04/2015   Tobacco abuse 01/04/2015   Depression with anxiety 01/04/2015   Past Surgical History:  Procedure Laterality Date   BIOPSY  12/10/2022   Procedure: BIOPSY;  Surgeon: Lemar Lofty., MD;  Location: [facility];  Service: Gastroenterology;;   CESAREAN SECTION     ESOPHAGOGASTRODUODENOSCOPY N/A 08/14/2021   Procedure: ESOPHAGOGASTRODUODENOSCOPY (EGD);  Surgeon: Rachael Fee, MD;  Location: [facility];  Service: Gastroenterology;  Laterality: N/A;   ESOPHAGOGASTRODUODENOSCOPY N/A 12/12/2022   Procedure: ESOPHAGOGASTRODUODENOSCOPY (EGD);  Surgeon: Lemar Lofty., MD;  Location: [facility];  Service: Gastroenterology;  Laterality: N/A;   ESOPHAGOGASTRODUODENOSCOPY (EGD) WITH PROPOFOL N/A 12/10/2022   Procedure: ESOPHAGOGASTRODUODENOSCOPY (EGD) WITH PROPOFOL;  Surgeon: Meridee Score Netty Starring., MD;  Location: [facility];  Service: Gastroenterology;  Laterality: N/A;   EUS N/A 08/14/2021   Procedure: UPPER ENDOSCOPIC ULTRASOUND (EUS) RADIAL;  Surgeon: Rachael Fee, MD;  Location: [facility];  Service: Gastroenterology;  Laterality:  N/A;   EUS N/A 12/12/2022   Procedure: UPPER ENDOSCOPIC ULTRASOUND (EUS) LINEAR;  Surgeon: Lemar Lofty., MD;  Location: [facility];  Service: Gastroenterology;  Laterality: N/A;   FINE NEEDLE ASPIRATION N/A 08/14/2021   Procedure: FINE NEEDLE ASPIRATION (FNA) LINEAR;  Surgeon: Rachael Fee, MD;  Location: [facility];  Service: Gastroenterology;  Laterality: N/A;   FINE NEEDLE ASPIRATION N/A 12/12/2022   Procedure: FINE NEEDLE ASPIRATION (FNA) LINEAR;  Surgeon: Lemar Lofty., MD;  Location: [facility];  Service: Gastroenterology;  Laterality: N/A;   IR IMAGING GUIDED PORT INSERTION  12/18/2022   IR PERC CHOLECYSTOSTOMY  12/13/2022   TONSILLECTOMY      Family History  Problem Relation Age of Onset   Dementia Mother    Arthritis Father    Asthma Father    Asthma Brother    Diabetes Maternal Grandmother    Dementia Maternal Grandmother    Diabetes Paternal Grandmother    Parkinson's disease Maternal Grandfather    Parkinson's disease Paternal Grandfather    Breast cancer Neg Hx    Colon cancer Neg Hx     Medications- Reconciled discharge and current medications in Epic.  Current Outpatient Medications  Medication Sig Dispense Refill   apixaban (ELIQUIS) 5 MG TABS tablet Take 2 tablets (10 mg total) by mouth 2 (two) times daily for 7 days, THEN 1 tablet (5 mg total) 2 (two) times daily for 21 days. 60 tablet 0   Ensure Max Protein (ENSURE MAX PROTEIN) LIQD Take 330 mLs (11 oz total) by mouth 2 (two) times daily.     gabapentin (NEURONTIN) 300 MG  capsule Take 2 capsules (600 mg total) by mouth 2 (two) times daily. (Patient taking differently: Take 900 mg by mouth at bedtime.) 60 capsule 0   mirtazapine (REMERON) 45 MG tablet Take 45 mg by mouth at bedtime.     pantoprazole (PROTONIX) 40 MG tablet Take 1 tablet (40 mg total) by mouth 2 (two) times daily. 180 tablet 0   QUEtiapine (SEROQUEL) 100 MG tablet Take 100-200 mg by mouth at bedtime.     sodium  chloride flush (NS) 0.9 % SOLN Use 5 - 10 mLs to flush abdominal drain once daily as directed 300 mL 3   sucralfate (CARAFATE) 1 GM/10ML suspension Take 10 mLs (1 g total) by mouth 4 (four) times daily -  with meals and at bedtime. 420 mL 1   topiramate (TOPAMAX) 50 MG tablet Take 50 mg by mouth 2 (two) times daily.     albuterol (VENTOLIN HFA) 108 (90 Base) MCG/ACT inhaler Inhale 2 puffs into the lungs every 6 (six) hours as needed for wheezing or shortness of breath. (Patient not taking: Reported on 12/08/2022) 8.5 g 1   APIXABAN (ELIQUIS) VTE STARTER PACK (10MG  AND 5MG ) Take as directed on package: start with two-5mg  tablets twice daily for 7 days. On day 8, switch to one-5mg  tablet twice daily. 74 each 0   folic acid (FOLVITE) 1 MG tablet Take 1 tablet (1 mg total) by mouth daily. (Patient not taking: Reported on 12/08/2022) 30 tablet 1   mometasone-formoterol (DULERA) 200-5 MCG/ACT AERO Inhale 2 puffs into the lungs 2 (two) times daily. (Patient not taking: Reported on 12/08/2022) 13 g 1   oxyCODONE (OXY IR/ROXICODONE) 5 MG immediate release tablet Take 1 tablet (5 mg total) by mouth every 6 (six) hours as needed for up to 5 days for moderate pain (pain score 4-6) or severe pain (pain score 7-10). 20 tablet 0   thiamine 100 MG tablet Take 1 tablet (100 mg total) by mouth daily. (Patient not taking: Reported on 09/12/2021) 30 tablet 1   No current facility-administered medications for this visit.    Allergies-reviewed and updated No Known Allergies  Social History   Socioeconomic History   Marital status: Divorced    Spouse name: Not on file   Number of children: Not on file   Years of education: Not on file   Highest education level: Not on file  Occupational History   Not on file  Tobacco Use   Smoking status: Some Days    Current packs/day: 0.50    Types: Cigarettes   Smokeless tobacco: Never  Vaping Use   Vaping status: Never Used  Substance and Sexual Activity   Alcohol use:  Not Currently    Alcohol/week: 16.0 standard drinks of alcohol    Types: 16 Glasses of wine per week    Comment: daily (1.5 liter bottle wine every 12)   Drug use: Not Currently    Types: Cocaine, Marijuana   Sexual activity: Yes    Partners: Male    Comment: female partner  Other Topics Concern   Not on file  Social History Narrative   Divorced. Homemaker. HS grad.    Lives with life partner (stan)   Drinks caffeine.   Wears seatbelt. Smoke dectector in the home. No firearms in the home.    Feels safe in her relationships.     Social Determinants of Health   Financial Resource Strain: Not on file  Food Insecurity: No Food Insecurity (12/08/2022)   Hunger Vital  Sign    Worried About Programme researcher, broadcasting/film/video in the Last Year: Never true    Ran Out of Food in the Last Year: Never true  Transportation Needs: No Transportation Needs (12/08/2022)   PRAPARE - Administrator, Civil Service (Medical): No    Lack of Transportation (Non-Medical): No  Physical Activity: Not on file  Stress: Not on file  Social Connections: Not on file        Objective:  Physical Exam: BP 94/64 (BP Location: Right Arm, Patient Position: Sitting)   Pulse (!) 119   Temp (!) 97.5 F (36.4 C) (Temporal)   Ht 5\' 3"  (1.6 m)   Wt 100 lb (45.4 kg)   SpO2 95%   BMI 17.71 kg/m   Gen: NAD, resting comfortably, gaunt appearance CV: Sinus tachycardia with no murmurs appreciated Pulm: NWOB, CTAB with no crackles, wheezes, or rhonchi GI: Normal bowel sounds present. Soft, Nontender, Protuberant abdomen. Drain present.  MSK: No edema, cyanosis, or clubbing noted Skin: Warm, dry; R chest large discolored scabbing nevus; bottom shows dried macular post shingles lesions Neuro: Grossly normal, moves all extremities Psych: Normal affect and thought content  Assessment/Plan:  Assessment and Plan    Pancreatic cancer -to follow with oncology on Monday as scheduled for plan  Esophageal Pain and  Vomiting Severe esophageal pain and vomiting likely secondary to recent endoscopy and gallbladder stent placement. Pain improved after vomiting and bowel movement. Difficulty swallowing large pills. -Switch Sucralfate to liquid form to aid swallowing and coat esophagus. -Consider liquid alternatives for other medications. -Continue current pain management with Oxycodone, refill for 5 days.  GB obstruction: Drain still present, nurse changed dressing today in office.   Portal vein/superior mesenteric/splenic vein thrombosis:  Noted on CT on 11/16.  Unclear if this is blood clot or infiltrating malignancy.  She will continue Eliquis.  Neuropathy Continue gabapentin   Skin Lesion Noted skin lesion on chest, potential concern for malignancy. -Refer to dermatology for evaluation.  Shingles Resolving, pt discontinued Valtrex due to size of pills and felt like she was improving.   General Health Maintenance -Repeat labs as recommended. -Encourage high protein diet, patient tolerating Yoplait protein yogurt well. -Follow-up with gastroenterology and oncology as recommended.       Summary/Review of work up during hospitalization: Labs, imaging, specialty consult notes     Maryem Shuffler, PA-C   Time Spent: 54 minutes of total time was spent on the date of the encounter performing the following actions: chart review prior to seeing the patient, obtaining history, performing a medically necessary exam, counseling on the treatment plan, placing orders, and documenting in our EHR.

## 2022-12-23 NOTE — ED Provider Notes (Signed)
[facility] AT [facility] Provider Note   CSN: [id number] Arrival date & time: 12/23/22  1534     History  Chief Complaint  Patient presents with   Weakness    Kathryn Underwood is a 62 y.o. female.  With a history of recently diagnosed pancreatic adenocarcinoma and COPD who presents to the ED for concern of infection.  Patient was first diagnosed with pancreatic cancer 6 days ago.  She underwent EGD/EUS on 1116 with subsequent cholecystotomy from IR on November 17.  She also had a Port-A-Cath placed in her left anterior chest wall.  Was discharged home on November 22.  Had a follow-up visit today where outpatient labs revealed leukocytosis of 18,000.  She was directed to the ED for further evaluation from her primary care team.  Here in the ED she notes persistent epigastric abdominal pain that has been episodic in nature.  1 episode of vomiting earlier this morning at home which relieved her epigastric pain.  Normal bowel movement earlier this morning as well.  No fevers chills chest pain or shortness of breath.  She has not yet begun any chemo or radiation therapy for the treatment of her cancer.   Weakness      Home Medications Prior to Admission medications   Medication Sig Start Date End Date Taking? Authorizing Provider  albuterol (VENTOLIN HFA) 108 (90 Base) MCG/ACT inhaler Inhale 2 puffs into the lungs every 6 (six) hours as needed for wheezing or shortness of breath. Patient not taking: Reported on 12/08/2022 08/06/21   Burnadette Pop, MD  apixaban (ELIQUIS) 5 MG TABS tablet Take 2 tablets (10 mg total) by mouth 2 (two) times daily for 7 days, THEN 1 tablet (5 mg total) 2 (two) times daily for 21 days. 12/18/22 01/15/23  Almon Hercules, MD  APIXABAN Everlene Balls) VTE STARTER PACK (10MG  AND 5MG ) Take as directed on package: start with two-5mg  tablets twice daily for 7 days. On day 8, switch to one-5mg  tablet twice daily. 12/18/22   Almon Hercules, MD   Ensure Max Protein (ENSURE MAX PROTEIN) LIQD Take 330 mLs (11 oz total) by mouth 2 (two) times daily. 12/18/22   Almon Hercules, MD  folic acid (FOLVITE) 1 MG tablet Take 1 tablet (1 mg total) by mouth daily. Patient not taking: Reported on 12/08/2022 08/07/21   Burnadette Pop, MD  gabapentin (NEURONTIN) 300 MG capsule Take 2 capsules (600 mg total) by mouth 2 (two) times daily. Patient taking differently: Take 900 mg by mouth at bedtime. 10/09/17   Rolan Bucco, MD  mirtazapine (REMERON) 45 MG tablet Take 45 mg by mouth at bedtime. 11/05/21   [provider]  mometasone-formoterol (DULERA) 200-5 MCG/ACT AERO Inhale 2 puffs into the lungs 2 (two) times daily. Patient not taking: Reported on 12/08/2022 08/06/21   Burnadette Pop, MD  oxyCODONE (OXY IR/ROXICODONE) 5 MG immediate release tablet Take 1 tablet (5 mg total) by mouth every 6 (six) hours as needed for up to 5 days for moderate pain (pain score 4-6) or severe pain (pain score 7-10). 12/23/22 12/28/22  Allwardt, Crist Infante, PA-C  pantoprazole (PROTONIX) 40 MG tablet Take 1 tablet (40 mg total) by mouth 2 (two) times daily. 12/18/22   Almon Hercules, MD  QUEtiapine (SEROQUEL) 100 MG tablet Take 100-200 mg by mouth at bedtime.    [provider]  sodium chloride flush (NS) 0.9 % SOLN Use 5 - 10 mLs to flush abdominal drain once daily as  directed 12/18/22   Allred, Darrell K, PA-C  sucralfate (CARAFATE) 1 GM/10ML suspension Take 10 mLs (1 g total) by mouth 4 (four) times daily -  with meals and at bedtime. 12/23/22   Allwardt, Crist Infante, PA-C  thiamine 100 MG tablet Take 1 tablet (100 mg total) by mouth daily. Patient not taking: Reported on 09/12/2021 08/07/21   Burnadette Pop, MD  topiramate (TOPAMAX) 50 MG tablet Take 50 mg by mouth 2 (two) times daily. 04/29/22   [provider]      Allergies    Patient has no known allergies.    Review of Systems   Review of Systems  Neurological:  Positive for weakness.     Physical Exam Updated Vital Signs BP 106/66   Pulse 99   Temp 98.7 F (37.1 C) (Oral)   Resp 20   Ht 5\' 3"  (1.6 m)   Wt 45.4 kg   SpO2 95%   BMI 17.71 kg/m  Physical Exam Vitals and nursing note reviewed.  HENT:     Head: Normocephalic and atraumatic.  Eyes:     Pupils: Pupils are equal, round, and reactive to light.  Cardiovascular:     Rate and Rhythm: Normal rate and regular rhythm.  Pulmonary:     Effort: Pulmonary effort is normal.     Breath sounds: Normal breath sounds.  Abdominal:     Palpations: Abdomen is soft.     Tenderness: There is no abdominal tenderness. There is no guarding or rebound.  Skin:    General: Skin is warm and dry.     Comments: Left anterior chest wall Port-A-Cath in place with healing overlying incision Percutaneous cholecystotomy tube in place with healing sutures overlying Mild erythema without purulent drainage or fluctuance Gauze dressing in place  Neurological:     Mental Status: She is alert.  Psychiatric:        Mood and Affect: Mood normal.     ED Results / Procedures / Treatments   Labs (all labs ordered are listed, but only abnormal results are displayed) Labs Reviewed  COMPREHENSIVE METABOLIC PANEL - Abnormal; Notable for the following components:      Result Value   Chloride 93 (*)    Glucose, Bld 108 (*)    Creatinine, Ser 0.43 (*)    Total Protein 5.7 (*)    Albumin 3.1 (*)    Alkaline Phosphatase 515 (*)    All other components within normal limits  CBC WITH DIFFERENTIAL/PLATELET - Abnormal; Notable for the following components:   WBC 17.4 (*)    RBC 3.67 (*)    HCT 34.7 (*)    Platelets 679 (*)    Neutro Abs 14.7 (*)    Abs Immature Granulocytes 0.08 (*)    All other components within normal limits  URINALYSIS, ROUTINE W REFLEX MICROSCOPIC - Abnormal; Notable for the following components:   Specific Gravity, Urine >1.046 (*)    Ketones, ur 15 (*)    Protein, ur TRACE (*)    All other components within  normal limits  CULTURE, BLOOD (ROUTINE X 2)  CULTURE, BLOOD (ROUTINE X 2)  LIPASE, BLOOD  AMYLASE  LACTIC ACID, PLASMA    EKG None  Radiology CT ABDOMEN PELVIS W CONTRAST  Result Date: 12/23/2022 CLINICAL DATA:  Status post recent drain placement pancreas cancer continued pain EXAM: CT ABDOMEN AND PELVIS WITH CONTRAST TECHNIQUE: Multidetector CT imaging of the abdomen and pelvis was performed using the standard protocol following bolus administration of  intravenous contrast. RADIATION DOSE REDUCTION: This exam was performed according to the departmental dose-optimization program which includes automated exposure control, adjustment of the mA and/or kV according to patient size and/or use of iterative reconstruction technique. CONTRAST:  85mL OMNIPAQUE IOHEXOL 300 MG/ML  SOLN COMPARISON:  CT 12/12/2022, MRI 12/09/2022, CT 12/08/2022, 07/24/2021 FINDINGS: Lower chest: Lung bases demonstrate no acute airspace disease. Similar small volume pericardial effusion. Small hiatal hernia and mild distal esophageal thickening. Small distal esophageal varices. Hepatobiliary: Multiple hypodense liver lesions, reference prior MRI for further description and characterisation. Interim placement of percutaneous cholecystostomy tube with decompression of the gallbladder. Heterogeneous enhancement pattern of the liver which may be due to combination of steatosis and perfusion abnormality. Central intra hepatic ducts and extrahepatic biliary dilatation, common duct measures up to 15 mm. Abrupt ductal cut off on coronal views due to pancreatic mass lesion. Pancreas: Poorly defined infiltrative mass involving the pancreatic head and neck, measures roughly 6 by 4.8 cm on series 2, image 28. Distal pancreatic atrophy with ductal dilatation. Soft tissue stranding at the pancreatic bed consistent with inflammation. Increased size of fluid collection between pancreas and stomach, now measuring 6.5 by 4.7 cm, previously 5.6 by  2.5 cm. Spleen: Normal in size without focal abnormality. Adrenals/Urinary Tract: Stable adrenal glands with diffuse thickening but no dominant mass. Kidneys show no hydronephrosis. The bladder is normal Stomach/Bowel: Moderate fluid distension of the stomach. Wall thickening of the pylorus and duodenal C sweep with inflammation. Focal wall thickening and area of fluid near duodenal bulb, coronal series 5, image 62, potentially due to an area of ulceration. No free gas in the street gin. Remainder of the bowel shows no evidence for obstruction or wall thickening Vascular/Lymphatic: Advanced aortic atherosclerosis. No aneurysm. Narrowing of superior mesenteric artery by infiltrative pancreas mass. Occluded appearing splenic and superior mesenteric veins. 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 Numerous collateral vessels throughout the mesentery. Reproductive: Calcified uterine fibroids.  No adnexal mass Other: No free air. No large volume pelvic ascites. Increased organized fluid collection between the head of pancreas and the second portion of duodenum, this now measures 4 x 2.6 cm, previously 2.4 x 2.2 cm. Musculoskeletal: No acute osseous abnormality. IMPRESSION: 1. Interim placement of percutaneous cholecystostomy tube with decompression of the gallbladder. 2. Mixed findings of pancreatic mass and inflammatory change. Poorly defined infiltrative mass involving the pancreatic head and neck consistent with known pancreatic adenocarcinoma. Persistent stranding at the pancreatic bed consistent with pancreatic inflammatory process. Increased size of peripancreatic fluid collections compared to the most recent prior CT. 3. Distended stomach with wall thickening of the antrum and pylorus suspicious for inflammation. Possible small duodenal ulcer but no perforation. Wall thickening and inflammation of the duodenal C sweep. 4. Multiple hypodense liver lesions, reference prior MRI for further description and characterization.  5. Occluded appearing splenic and superior mesenteric veins. 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 Numerous collateral vessels throughout the mesentery. These findings are present on the prior exam 6. Aortic atherosclerosis. Aortic Atherosclerosis (ICD10-I70.0). Electronically Signed   By: Jasmine Pang M.D.   On: 12/23/2022 18:51    Procedures Procedures    Medications Ordered in ED Medications  sodium chloride 0.9 % bolus 1,000 mL (0 mLs Intravenous Stopped 12/23/22 1730)  piperacillin-tazobactam (ZOSYN) IVPB 3.375 g (0 g Intravenous Stopped 12/23/22 1849)  vancomycin (VANCOCIN) IVPB 1000 mg/200 mL premix (0 mg Intravenous Stopped 12/23/22 2020)  iohexol (OMNIPAQUE) 300 MG/ML solution 100 mL (85 mLs Intravenous Contrast Given 12/23/22 1737)  morphine (PF) 4  MG/ML injection 4 mg (4 mg Intravenous Given 12/23/22 1751)  sodium chloride 0.9 % bolus 1,000 mL (1,000 mLs Intravenous New Bag/Given 12/23/22 2022)    ED Course/ Medical Decision Making/ A&P Clinical Course as of 12/23/22 2158  Wed Dec 23, 2022  2157 CT abdomen pelvis notable for cholecystostomy tube in place.  Inflammatory changes surrounding the pancreas with increased peripancreatic fluid compared to prior, distended stomach with inflammatory changes with other findings similar to most recent imaging.  In light of leukocytosis and CT findings, this is most concerning for intra-abdominal infection potential gastritis/pancreatitis.  Discussed with admitting hospitalist who accept patient for admission patient has received broad-spectrum coverage with vancomycin and Zosyn here and morphine for pain control [MP]    Clinical Course User Index [MP] Royanne Foots, DO                                 Medical Decision Making 62 year old female with history as above including recently diagnosed pancreatic cancer presenting given concern for leukocytosis on outpatient labs.  Reviewed discharge summary of 12/18/2022.  Underwent EGD/EUS on  November 16 along with percutaneous cholecystotomy on November 17.  Upon initial assessment hypotensive and tachycardic.  1 episode of nonbloody nonbilious emesis earlier this morning.  No other overt symptoms of infection.  Physical exam reveals percutaneous cholecystotomy tube in place without external evidence of infection.  Presentation most concerning for postprocedural intra-abdominal infection as well as dehydration.  Will provide IV fluids for rehydration and obtain infectious laboratory work including CBC, CMP, lipase, amylase blood cultures, UA and venous lactate.  Will evaluate for intra-abdominal infection with CT abdomen pelvis.  Admission likely.  Amount and/or Complexity of Data Reviewed Labs: ordered. Radiology: ordered.  Risk Prescription drug management. Decision regarding hospitalization.           Final Clinical Impression(s) / ED Diagnoses Final diagnoses:  Intra-abdominal infection  Malignant neoplasm of pancreas, unspecified location of malignancy [facility])    Rx / DC Orders ED Discharge Orders     None         Royanne Foots, DO 12/23/22 2158

## 2022-12-23 NOTE — Telephone Encounter (Signed)
Clydie Braun with Lab called pt critical WBC 18.3

## 2022-12-23 NOTE — Progress Notes (Signed)
The proposed treatment discussed in conference is for discussion purpose only and is not a binding recommendation.  The patients have not been physically examined, or presented with their treatment options.  Therefore, final treatment plans cannot be decided.  

## 2022-12-23 NOTE — ED Notes (Signed)
Patient transported to CT 

## 2022-12-23 NOTE — ED Notes (Signed)
Called [facility] to transport patient to [facility] 6 E rm# [id number]

## 2022-12-23 NOTE — Progress Notes (Signed)
Hospitalist Transfer Note:    Nursing staff, Please call [facility] Admits & Consults System-Wide number on Amion [phone]) as soon as patient's arrival, so appropriate admitting provider can evaluate the pt.   Transferring facility: [facility] Requesting provider: Dr. Estelle June (EDP at [facility]) Reason for transfer: admission for further evaluation and management of SIRS criteria.   62 year old female with recently diagnosed pancreatic cancer, status post percutaneous cholecystostomy tube last week,  who presented to [facility] ED at the recommendation of her PCP for finding of leukocytosis on routine outpatient CBC.   The patient was reportedly diagnosed with pancreatic cancer last week, at which time she underwent a percutaneous cholecystostomy tube placed by interventional radiology.  She was undergoing routine outpatient labs earlier today, went CBC noted interval increase in white blood cell count, now up to 17,000.  Afebrile.  The patient continues to complain of abdominal discomfort, but without any significant worsening over the course the last week.  Otherwise, she denies any acute complaints.  Vital signs in the ED were notable for the following: Afebrile, heart rates in the low 100s.  Imaging notable for CT abdomen/pelvis which was reported to show an in-place percutaneous cholecystostomy tube, along with some nonspecific inflammatory changes around the pancreas as well as nonspecific inflammatory changes around the stomach, without overt drainable fluid collection/abscess.  Medications administered prior to transfer included the following: IV vancomycin and Zosyn for concern for underlying intra-abdominal infection in the setting of presenting SIRS criteria.  Subsequently, I accepted this patient for transfer for inpatient admission to a med/tele bed at [facility] for further work-up and management of the above.       Newton Pigg, DO Hospitalist

## 2022-12-23 NOTE — ED Notes (Signed)
First set of blood cultures were drawn before starting antibiotics. Attempted to get 2nd set of blood cultures. Unsuccessful.

## 2022-12-23 NOTE — Telephone Encounter (Signed)
Called pt and advised, pt states she will leave and go to [facility] ED now to be assessed

## 2022-12-24 ENCOUNTER — Encounter (HOSPITAL_COMMUNITY): Payer: Self-pay | Admitting: Family Medicine

## 2022-12-24 DIAGNOSIS — R6510 Systemic inflammatory response syndrome (SIRS) of non-infectious origin without acute organ dysfunction: Secondary | ICD-10-CM | POA: Diagnosis present

## 2022-12-24 DIAGNOSIS — K859 Acute pancreatitis without necrosis or infection, unspecified: Secondary | ICD-10-CM | POA: Diagnosis not present

## 2022-12-24 DIAGNOSIS — K861 Other chronic pancreatitis: Secondary | ICD-10-CM

## 2022-12-24 LAB — COMPREHENSIVE METABOLIC PANEL
ALT: 14 U/L (ref 0–44)
AST: 21 U/L (ref 15–41)
Albumin: 2.5 g/dL — ABNORMAL LOW (ref 3.5–5.0)
Alkaline Phosphatase: 460 U/L — ABNORMAL HIGH (ref 38–126)
Anion gap: 8 (ref 5–15)
BUN: 14 mg/dL (ref 8–23)
CO2: 27 mmol/L (ref 22–32)
Calcium: 8.1 mg/dL — ABNORMAL LOW (ref 8.9–10.3)
Chloride: 95 mmol/L — ABNORMAL LOW (ref 98–111)
Creatinine, Ser: 0.46 mg/dL (ref 0.44–1.00)
GFR, Estimated: >60 mL/min (ref >60)
Glucose, Bld: 98 mg/dL (ref 70–99)
Potassium: 3.6 mmol/L (ref 3.5–5.1)
Sodium: 130 mmol/L — ABNORMAL LOW (ref 135–145)
Total Bilirubin: 1.1 mg/dL (ref <1.2)
Total Protein: 5.2 g/dL — ABNORMAL LOW (ref 6.5–8.1)

## 2022-12-24 LAB — CBC
HCT: 32.2 % — ABNORMAL LOW (ref 36.0–46.0)
Hemoglobin: 10.6 g/dL — ABNORMAL LOW (ref 12.0–15.0)
MCH: 33 pg (ref 26.0–34.0)
MCHC: 32.9 g/dL (ref 30.0–36.0)
MCV: 100.3 fL — ABNORMAL HIGH (ref 80.0–100.0)
Platelets: 560 10*3/uL — ABNORMAL HIGH (ref 150–400)
RBC: 3.21 MIL/uL — ABNORMAL LOW (ref 3.87–5.11)
RDW: 14.1 % (ref 11.5–15.5)
WBC: 12.6 10*3/uL — ABNORMAL HIGH (ref 4.0–10.5)
nRBC: 0 % (ref 0.0–0.2)

## 2022-12-24 LAB — VITAMIN D 25 HYDROXY (VIT D DEFICIENCY, FRACTURES): Vit D, 25-Hydroxy: 10.16 ng/mL — ABNORMAL LOW (ref 30–100)

## 2022-12-24 LAB — MAGNESIUM: Magnesium: 2 mg/dL (ref 1.7–2.4)

## 2022-12-24 MED ORDER — PIPERACILLIN-TAZOBACTAM 3.375 G IVPB
3.3750 g | Freq: Three times a day (TID) | INTRAVENOUS | Status: AC
  Administered 2022-12-24 – 2022-12-26 (×9): 3.375 g via INTRAVENOUS
  Filled 2022-12-24 (×9): qty 50

## 2022-12-24 MED ORDER — ALUM & MAG HYDROXIDE-SIMETH 200-200-20 MG/5ML PO SUSP
30.0000 mL | ORAL | Status: DC | PRN
  Administered 2022-12-24: 30 mL via ORAL
  Filled 2022-12-24: qty 30

## 2022-12-24 MED ORDER — PANTOPRAZOLE SODIUM 40 MG IV SOLR
40.0000 mg | Freq: Two times a day (BID) | INTRAVENOUS | Status: DC
  Administered 2022-12-24 – 2022-12-27 (×8): 40 mg via INTRAVENOUS
  Filled 2022-12-24 (×8): qty 10

## 2022-12-24 MED ORDER — CHLORHEXIDINE GLUCONATE CLOTH 2 % EX PADS
6.0000 | MEDICATED_PAD | Freq: Every day | CUTANEOUS | Status: DC
  Administered 2022-12-24 – 2022-12-27 (×3): 6 via TOPICAL

## 2022-12-24 MED ORDER — GABAPENTIN 300 MG PO CAPS
900.0000 mg | ORAL_CAPSULE | Freq: Every day | ORAL | Status: DC
  Administered 2022-12-24 – 2022-12-26 (×4): 900 mg via ORAL
  Filled 2022-12-24 (×4): qty 3

## 2022-12-24 MED ORDER — ACETAMINOPHEN 650 MG RE SUPP: 650.0000 mg | Freq: Four times a day (QID) | RECTAL | Status: DC | PRN

## 2022-12-24 MED ORDER — SODIUM CHLORIDE 0.9% FLUSH
3.0000 mL | Freq: Two times a day (BID) | INTRAVENOUS | Status: DC
  Administered 2022-12-24 – 2022-12-26 (×7): 3 mL via INTRAVENOUS

## 2022-12-24 MED ORDER — QUETIAPINE FUMARATE 100 MG PO TABS
100.0000 mg | ORAL_TABLET | Freq: Every day | ORAL | Status: DC
  Administered 2022-12-24 – 2022-12-25 (×3): 100 mg via ORAL
  Administered 2022-12-26: 200 mg via ORAL
  Filled 2022-12-24: qty 2
  Filled 2022-12-24 (×3): qty 1

## 2022-12-24 MED ORDER — APIXABAN 5 MG PO TABS
10.0000 mg | ORAL_TABLET | Freq: Two times a day (BID) | ORAL | Status: AC
  Administered 2022-12-24 (×3): 10 mg via ORAL
  Filled 2022-12-24 (×3): qty 2

## 2022-12-24 MED ORDER — LACTATED RINGERS IV SOLN: INTRAVENOUS | Status: AC

## 2022-12-24 MED ORDER — MIRTAZAPINE 30 MG PO TABS
45.0000 mg | ORAL_TABLET | Freq: Every day | ORAL | Status: DC
  Administered 2022-12-24 – 2022-12-26 (×4): 45 mg via ORAL
  Filled 2022-12-24 (×4): qty 1

## 2022-12-24 MED ORDER — HYDROMORPHONE HCL 1 MG/ML IJ SOLN
0.5000 mg | INTRAMUSCULAR | Status: DC | PRN
  Administered 2022-12-24 – 2022-12-27 (×17): 0.5 mg via INTRAVENOUS
  Filled 2022-12-24 (×17): qty 0.5

## 2022-12-24 MED ORDER — ALBUTEROL SULFATE (2.5 MG/3ML) 0.083% IN NEBU: 3.0000 mL | INHALATION_SOLUTION | Freq: Four times a day (QID) | RESPIRATORY_TRACT | Status: DC | PRN

## 2022-12-24 MED ORDER — ACETAMINOPHEN 325 MG PO TABS: 650.0000 mg | ORAL_TABLET | Freq: Four times a day (QID) | ORAL | Status: DC | PRN

## 2022-12-24 MED ORDER — APIXABAN 5 MG PO TABS
5.0000 mg | ORAL_TABLET | Freq: Two times a day (BID) | ORAL | Status: DC
  Administered 2022-12-25 – 2022-12-26 (×3): 5 mg via ORAL
  Filled 2022-12-24 (×3): qty 1

## 2022-12-24 MED ORDER — SUCRALFATE 1 GM/10ML PO SUSP
1.0000 g | Freq: Three times a day (TID) | ORAL | Status: DC
Start: 2022-12-24 — End: 2022-12-27
  Administered 2022-12-24 – 2022-12-27 (×14): 1 g via ORAL
  Filled 2022-12-24 (×14): qty 10

## 2022-12-24 MED ORDER — PROCHLORPERAZINE EDISYLATE 10 MG/2ML IJ SOLN
5.0000 mg | Freq: Four times a day (QID) | INTRAMUSCULAR | Status: DC | PRN
  Filled 2022-12-24: qty 2

## 2022-12-24 MED ORDER — OXYCODONE HCL 5 MG PO TABS
5.0000 mg | ORAL_TABLET | ORAL | Status: DC | PRN
  Administered 2022-12-24 – 2022-12-27 (×9): 5 mg via ORAL
  Filled 2022-12-24 (×9): qty 1

## 2022-12-24 NOTE — Plan of Care (Signed)
  Problem: Activity: Goal: Risk for activity intolerance will decrease Outcome: Progressing   Problem: Nutrition: Goal: Adequate nutrition will be maintained Outcome: Progressing   Problem: Coping: Goal: Level of anxiety will decrease Outcome: Progressing   Problem: Pain Management: Goal: General experience of comfort will improve Outcome: Progressing   Problem: Safety: Goal: Ability to remain free from injury will improve Outcome: Progressing

## 2022-12-24 NOTE — H&P (Signed)
History and Physical    Kathryn Underwood VZD:[id number] DOB: 07-12-1960 DOA: 12/23/2022  PCP: Bary Leriche, PA-C   Patient coming from: Home    Chief Complaint: Epigastric pain, N/V, elevated WBC  HPI: Kathryn Underwood is a 62 y.o. female with medical history significant for alcoholism, pancreatitis with pseudocyst, anxiety, depression, neuropathy, and recent diagnosis of pancreatic adenocarcinoma who presents for evaluation of elevated WBC on outpatient blood work.   Patient reports that she has had persistent epigastric pain and nausea since the recent hospital discharge.  She had eaten some grits yesterday, had severe epigastric pain following this that kept her up all night.  She then vomited a large volume this morning which relieved her pain.  She reports having a normal bowel movement this morning.  She denies any fevers or chills.  She has not noticed any change in the character or volume of her cholecystostomy tube drainage.   She saw a PA in the clinic today for hospital follow-up, had blood work performed, and received a call regarding elevated WBC.  She was advised to seek evaluation in the emergency department for this.  [facility] ED Course: Upon arrival to the ED, patient is found to be afebrile and saturating mid 90s on room air with mild tachycardia and SBP as low as 83.  Labs are most notable for WBC 17,400, platelets 679,000, alkaline phosphatase 515,000, normal lipase, and normal lactic acid.   CT is most notable for persistent stranding about the pancreas, increase in peripancreatic fluid collection, distended stomach with wall thickening of the antrum and pylorus, and possible small duodenal ulcer without perforation.  Blood cultures collected in the ED and she was treated with vancomycin, Zosyn, 2 L of saline, and morphine.  She was transferred to [facility] for admission.  Review of Systems:  All other systems reviewed and apart from HPI, are  negative.  Past Medical History:  Diagnosis Date   Alcohol abuse    Anxiety    Chickenpox    Depression    Neuromuscular disorder (HCC)    neuropathy   Substance abuse [facility])     Past Surgical History:  Procedure Laterality Date   BIOPSY  12/10/2022   Procedure: BIOPSY;  Surgeon: Mansouraty, Netty Starring., MD;  Location: [facility];  Service: Gastroenterology;;   CESAREAN SECTION     ESOPHAGOGASTRODUODENOSCOPY N/A 08/14/2021   Procedure: ESOPHAGOGASTRODUODENOSCOPY (EGD);  Surgeon: Rachael Fee, MD;  Location: [facility];  Service: Gastroenterology;  Laterality: N/A;   ESOPHAGOGASTRODUODENOSCOPY N/A 12/12/2022   Procedure: ESOPHAGOGASTRODUODENOSCOPY (EGD);  Surgeon: Lemar Lofty., MD;  Location: [facility];  Service: Gastroenterology;  Laterality: N/A;   ESOPHAGOGASTRODUODENOSCOPY (EGD) WITH PROPOFOL N/A 12/10/2022   Procedure: ESOPHAGOGASTRODUODENOSCOPY (EGD) WITH PROPOFOL;  Surgeon: Meridee Score Netty Starring., MD;  Location: [facility];  Service: Gastroenterology;  Laterality: N/A;   EUS N/A 08/14/2021   Procedure: UPPER ENDOSCOPIC ULTRASOUND (EUS) RADIAL;  Surgeon: Rachael Fee, MD;  Location: [facility];  Service: Gastroenterology;  Laterality: N/A;   EUS N/A 12/12/2022   Procedure: UPPER ENDOSCOPIC ULTRASOUND (EUS) LINEAR;  Surgeon: Lemar Lofty., MD;  Location: [facility];  Service: Gastroenterology;  Laterality: N/A;   FINE NEEDLE ASPIRATION N/A 08/14/2021   Procedure: FINE NEEDLE ASPIRATION (FNA) LINEAR;  Surgeon: Rachael Fee, MD;  Location: [facility];  Service: Gastroenterology;  Laterality: N/A;   FINE NEEDLE ASPIRATION N/A 12/12/2022   Procedure: FINE NEEDLE ASPIRATION (FNA) LINEAR;  Surgeon: Lemar Lofty., MD;  Location: [facility];  Service: Gastroenterology;  Laterality: N/A;   IR IMAGING GUIDED PORT INSERTION  12/18/2022   IR PERC CHOLECYSTOSTOMY  12/13/2022   TONSILLECTOMY      Social History:   reports that she  has been smoking cigarettes. She has never used smokeless tobacco. She reports that she does not currently use alcohol after a past usage of about 16.0 standard drinks of alcohol per week. She reports that she does not currently use drugs after having used the following drugs: Cocaine and Marijuana.  No Known Allergies  Family History  Problem Relation Age of Onset   Dementia Mother    Arthritis Father    Asthma Father    Asthma Brother    Diabetes Maternal Grandmother    Dementia Maternal Grandmother    Diabetes Paternal Grandmother    Parkinson's disease Maternal Grandfather    Parkinson's disease Paternal Grandfather    Breast cancer Neg Hx    Colon cancer Neg Hx      Prior to Admission medications   Medication Sig Start Date End Date Taking? Authorizing Provider  albuterol (VENTOLIN HFA) 108 (90 Base) MCG/ACT inhaler Inhale 2 puffs into the lungs every 6 (six) hours as needed for wheezing or shortness of breath. Patient not taking: Reported on 12/08/2022 08/06/21   Burnadette Pop, MD  apixaban (ELIQUIS) 5 MG TABS tablet Take 2 tablets (10 mg total) by mouth 2 (two) times daily for 7 days, THEN 1 tablet (5 mg total) 2 (two) times daily for 21 days. 12/18/22 01/15/23  Almon Hercules, MD  APIXABAN Everlene Balls) VTE STARTER PACK (10MG  AND 5MG ) Take as directed on package: start with two-5mg  tablets twice daily for 7 days. On day 8, switch to one-5mg  tablet twice daily. 12/18/22   Almon Hercules, MD  Ensure Max Protein (ENSURE MAX PROTEIN) LIQD Take 330 mLs (11 oz total) by mouth 2 (two) times daily. 12/18/22   Almon Hercules, MD  folic acid (FOLVITE) 1 MG tablet Take 1 tablet (1 mg total) by mouth daily. Patient not taking: Reported on 12/08/2022 08/07/21   Burnadette Pop, MD  gabapentin (NEURONTIN) 300 MG capsule Take 2 capsules (600 mg total) by mouth 2 (two) times daily. Patient taking differently: Take 900 mg by mouth at bedtime. 10/09/17   Rolan Bucco, MD  mirtazapine (REMERON) 45 MG  tablet Take 45 mg by mouth at bedtime. 11/05/21   [provider]  mometasone-formoterol (DULERA) 200-5 MCG/ACT AERO Inhale 2 puffs into the lungs 2 (two) times daily. Patient not taking: Reported on 12/08/2022 08/06/21   Burnadette Pop, MD  oxyCODONE (OXY IR/ROXICODONE) 5 MG immediate release tablet Take 1 tablet (5 mg total) by mouth every 6 (six) hours as needed for up to 5 days for moderate pain (pain score 4-6) or severe pain (pain score 7-10). 12/23/22 12/28/22  Allwardt, Crist Infante, PA-C  pantoprazole (PROTONIX) 40 MG tablet Take 1 tablet (40 mg total) by mouth 2 (two) times daily. 12/18/22   Almon Hercules, MD  QUEtiapine (SEROQUEL) 100 MG tablet Take 100-200 mg by mouth at bedtime.    [provider]  sodium chloride flush (NS) 0.9 % SOLN Use 5 - 10 mLs to flush abdominal drain once daily as directed 12/18/22   Allred, Darrell K, PA-C  sucralfate (CARAFATE) 1 GM/10ML suspension Take 10 mLs (1 g total) by mouth 4 (four) times daily -  with meals and at bedtime. 12/23/22   Allwardt, Crist Infante, PA-C  thiamine 100 MG tablet  Take 1 tablet (100 mg total) by mouth daily. Patient not taking: Reported on 09/12/2021 08/07/21   Burnadette Pop, MD  topiramate (TOPAMAX) 50 MG tablet Take 50 mg by mouth 2 (two) times daily. 04/29/22   [provider]    Physical Exam: Vitals:   12/23/22 2000 12/23/22 2146 12/23/22 2240 12/24/22 0158  BP: 106/66  98/60 111/66  Pulse: 99  94 92  Resp: 20     Temp:   97.7 F (36.5 C) 98.5 F (36.9 C)  TempSrc:  Oral Oral Oral  SpO2: 95%  94% 94%  Weight:      Height:        Constitutional: NAD, no pallor or diaphoresis   Eyes: PERTLA, lids and conjunctivae normal ENMT: Mucous membranes are moist. Posterior pharynx clear of any exudate or lesions.   Neck: supple, no masses  Respiratory: no wheezing, no crackles. No accessory muscle use.  Cardiovascular: S1 & S2 heard, regular rate and rhythm. No extremity edema.   Abdomen: Soft, no  distension. Bowel sounds active.  Musculoskeletal: no clubbing / cyanosis. No joint deformity upper and lower extremities.   Skin: no significant rashes, lesions, ulcers. Warm, dry, well-perfused. Neurologic: CN 2-12 grossly intact. Moving all extremities. Alert and oriented.  Psychiatric: Calm. Cooperative.    Labs and Imaging on Admission: I have personally reviewed following labs and imaging studies  CBC: Recent Labs  Lab 12/17/22 0655 12/18/22 0635 12/23/22 1243 12/23/22 1616  WBC 10.5 10.5 18.3 Repeated and verified X2.* 17.4*  NEUTROABS  --   --  15.8* 14.7*  HGB 11.1* 11.6* 12.8 12.0  HCT 33.2* 33.4* 38.7 34.7*  MCV 99.4 96.3 100.2* 94.6  PLT 394 397 760.0* 679*   Basic Metabolic Panel: Recent Labs  Lab 12/17/22 0655 12/17/22 0657 12/23/22 1243 12/23/22 1616  NA  --  131* 134* 135  K  --  3.6 4.0 3.7  CL  --  97* 95* 93*  CO2  --  25 32 31  GLUCOSE  --  113* 116* 108*  BUN  --  <5* 12 13  CREATININE  --  0.37* 0.48 0.43*  CALCIUM  --  7.8* 9.4 9.2  MG 1.8  --   --   --   PHOS  --  2.9  --   --    GFR: Estimated Creatinine Clearance: 51.6 mL/min (A) (by C-G formula based on SCr of 0.43 mg/dL (L)). Liver Function Tests: Recent Labs  Lab 12/17/22 0655 12/17/22 0657 12/23/22 1243 12/23/22 1616  AST 12*  --  19 19  ALT 14  --  13 12  ALKPHOS 173*  --  611* 515*  BILITOT 0.9  --  0.8 0.9  PROT 5.0*  --  5.9* 5.7*  ALBUMIN 2.3* 2.4* 3.3* 3.1*   Recent Labs  Lab 12/17/22 0655 12/23/22 1616  LIPASE 31 43  AMYLASE  --  36   No results for input(s): "AMMONIA" in the last 168 hours. Coagulation Profile: No results for input(s): "INR", "PROTIME" in the last 168 hours. Cardiac Enzymes: No results for input(s): "CKTOTAL", "CKMB", "CKMBINDEX", "TROPONINI" in the last 168 hours. BNP (last 3 results) No results for input(s): "PROBNP" in the last 8760 hours. HbA1C: No results for input(s): "HGBA1C" in the last 72 hours. CBG: Recent Labs  Lab  12/17/22 1159 12/17/22 1709 12/18/22 0032 12/18/22 0604 12/18/22 1128  GLUCAP 133* 126* 116* 94 99   Lipid Profile: No results for input(s): "CHOL", "HDL", "LDLCALC", "TRIG", "  CHOLHDL", "LDLDIRECT" in the last 72 hours. Thyroid Function Tests: No results for input(s): "TSH", "T4TOTAL", "FREET4", "T3FREE", "THYROIDAB" in the last 72 hours. Anemia Panel: No results for input(s): "VITAMINB12", "FOLATE", "FERRITIN", "TIBC", "IRON", "RETICCTPCT" in the last 72 hours. Urine analysis:    Component Value Date/Time   COLORURINE YELLOW 12/23/2022 1616   APPEARANCEUR CLEAR 12/23/2022 1616   LABSPEC >1.046 (H) 12/23/2022 1616   PHURINE 8.0 12/23/2022 1616   GLUCOSEU NEGATIVE 12/23/2022 1616   HGBUR NEGATIVE 12/23/2022 1616   BILIRUBINUR NEGATIVE 12/23/2022 1616   KETONESUR 15 (A) 12/23/2022 1616   PROTEINUR TRACE (A) 12/23/2022 1616   UROBILINOGEN 0.2 01/23/2010 1750   NITRITE NEGATIVE 12/23/2022 1616   LEUKOCYTESUR NEGATIVE 12/23/2022 1616   Sepsis Labs: @LABRCNTIP (procalcitonin:4,lacticidven:4) ) Recent Results (from the past 240 hour(s))  Culture, blood (routine x 2)     Status: None (Preliminary result)   Collection Time: 12/23/22  3:55 PM   Specimen: BLOOD  Result Value Ref Range Status   Specimen Description   Final    BLOOD Left Chest Port Performed at [facility], [address], [place], [place] [address]    Special Requests   Final    Blood Culture results may not be optimal due to an inadequate volume of blood received in culture bottles BOTTLES DRAWN AEROBIC AND ANAEROBIC Performed at [facility], [address] [place], [place] [address]    Culture PENDING  Incomplete   Report Status PENDING  Incomplete     Radiological Exams on Admission: CT ABDOMEN PELVIS W CONTRAST  Result Date: 12/23/2022 CLINICAL DATA:  Status post recent drain placement pancreas cancer continued pain EXAM: CT ABDOMEN AND PELVIS WITH CONTRAST TECHNIQUE:  Multidetector CT imaging of the abdomen and pelvis was performed using the standard protocol following bolus administration of intravenous contrast. RADIATION DOSE REDUCTION: This exam was performed according to the departmental dose-optimization program which includes automated exposure control, adjustment of the mA and/or kV according to patient size and/or use of iterative reconstruction technique. CONTRAST:  85mL OMNIPAQUE IOHEXOL 300 MG/ML  SOLN COMPARISON:  CT 12/12/2022, MRI 12/09/2022, CT 12/08/2022, 07/24/2021 FINDINGS: Lower chest: Lung bases demonstrate no acute airspace disease. Similar small volume pericardial effusion. Small hiatal hernia and mild distal esophageal thickening. Small distal esophageal varices. Hepatobiliary: Multiple hypodense liver lesions, reference prior MRI for further description and characterisation. Interim placement of percutaneous cholecystostomy tube with decompression of the gallbladder. Heterogeneous enhancement pattern of the liver which may be due to combination of steatosis and perfusion abnormality. Central intra hepatic ducts and extrahepatic biliary dilatation, common duct measures up to 15 mm. Abrupt ductal cut off on coronal views due to pancreatic mass lesion. Pancreas: Poorly defined infiltrative mass involving the pancreatic head and neck, measures roughly 6 by 4.8 cm on series 2, image 28. Distal pancreatic atrophy with ductal dilatation. Soft tissue stranding at the pancreatic bed consistent with inflammation. Increased size of fluid collection between pancreas and stomach, now measuring 6.5 by 4.7 cm, previously 5.6 by 2.5 cm. Spleen: Normal in size without focal abnormality. Adrenals/Urinary Tract: Stable adrenal glands with diffuse thickening but no dominant mass. Kidneys show no hydronephrosis. The bladder is normal Stomach/Bowel: Moderate fluid distension of the stomach. Wall thickening of the pylorus and duodenal C sweep with inflammation. Focal wall  thickening and area of fluid near duodenal bulb, coronal series 5, image 62, potentially due to an area of ulceration. No free gas in the street gin. Remainder of the bowel shows no  evidence for obstruction or wall thickening Vascular/Lymphatic: Advanced aortic atherosclerosis. No aneurysm. Narrowing of superior mesenteric artery by infiltrative pancreas mass. Occluded appearing splenic and superior mesenteric veins. 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 Numerous collateral vessels throughout the mesentery. Reproductive: Calcified uterine fibroids.  No adnexal mass Other: No free air. No large volume pelvic ascites. Increased organized fluid collection between the head of pancreas and the second portion of duodenum, this now measures 4 x 2.6 cm, previously 2.4 x 2.2 cm. Musculoskeletal: No acute osseous abnormality. IMPRESSION: 1. Interim placement of percutaneous cholecystostomy tube with decompression of the gallbladder. 2. Mixed findings of pancreatic mass and inflammatory change. Poorly defined infiltrative mass involving the pancreatic head and neck consistent with known pancreatic adenocarcinoma. Persistent stranding at the pancreatic bed consistent with pancreatic inflammatory process. Increased size of peripancreatic fluid collections compared to the most recent prior CT. 3. Distended stomach with wall thickening of the antrum and pylorus suspicious for inflammation. Possible small duodenal ulcer but no perforation. Wall thickening and inflammation of the duodenal C sweep. 4. Multiple hypodense liver lesions, reference prior MRI for further description and characterization. 5. Occluded appearing splenic and superior mesenteric veins. 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 Numerous collateral vessels throughout the mesentery. These findings are present on the prior exam 6. Aortic atherosclerosis. Aortic Atherosclerosis (ICD10-I70.0). Electronically Signed   By: Jasmine Pang M.D.   On: 12/23/2022 18:51      Assessment/Plan   1. SIRS  - Sent for marked elevation in WBC  - She is afebrile but was tachycardic, tachypneic, and hypotensive initially - Increase peripancreatic fluid noted on CT but no clear evidence for infection  - Blood cultures were collected in ED and she was treated with vancomycin and Zosyn  - Given multiple SIRS criteria, hypotension, and likely poor reserve, favor continuing Zosyn for now while following cultures and clinical course    2. Pancreatic adenocarcinoma  - Recent diagnosis, Port has been placed and ready to use  - Patient has appt with Dr. Mosetta Putt of oncology scheduled for 12/2 per her report   3. Esophagitis; ?duodenal ulcer  - Patient had erosive esophagitis on recent EGD and question of small duodenal ulcer without perforation is raised on CT in ED  - She had been prescribed Carafate and PPI but was not taking the Carafate  - Start Carafate, use IV PPI for now   4. Splenic vein thrombosis  - Continue Eliquis   5. GB distension  - Cholecystostomy tube in place, continue drain care    6. Depression, anxiety  - Continue Remeron and Seroquel    7. COPD  - Not in exacerbation  - Albuterol as needed    DVT prophylaxis: Eliquis   Code Status: Full  Level of Care: Level of care: Telemetry Family Communication: None present  Disposition Plan:  Patient is from: home  Anticipated d/c is to: TBD Anticipated d/c date is: 12/27/22  Patient currently: Pending pain-control, tolerance of adequate oral intake, cultures  Consults called: None  Admission status: Inpatient     Briscoe Deutscher, MD [facility]  12/24/2022, 2:59 AM

## 2022-12-24 NOTE — Progress Notes (Signed)
Pharmacy Antibiotic Note  Kathryn Underwood is a 62 y.o. female admitted on 12/23/2022 with recent diagnosed pancreatic cancer, presents with intra-abdominal infection.  Pharmacy has been consulted for zosyn dosing.  Plan: Zosyn 3.375g IV q8h (4 hour infusion). Follow renal function and clinical course  Height: 5\' 3"  (160 cm) Weight: 45.4 kg (100 lb) IBW/kg (Calculated) : 52.4  Temp (24hrs), Avg:98.2 F (36.8 C), Min:97.5 F (36.4 C), Max:98.9 F (37.2 C)  Recent Labs  Lab 12/17/22 0655 12/17/22 0657 12/18/22 0635 12/23/22 1243 12/23/22 1616  WBC 10.5  --  10.5 18.3 Repeated and verified X2.* 17.4*  CREATININE  --  0.37*  --  0.48 0.43*  LATICACIDVEN  --   --   --   --  0.8    Estimated Creatinine Clearance: 51.6 mL/min (A) (by C-G formula based on SCr of 0.43 mg/dL (L)).    No Known Allergies   Thank you for allowing pharmacy to be a part of this patient's care.  Arley Phenix RPh 12/24/2022, 3:13 AM

## 2022-12-24 NOTE — Progress Notes (Signed)
PROGRESS NOTE    Kathryn Underwood  ZOX:[id number] DOB: 25-Nov-1960 DOA: 12/23/2022 PCP: Bary Leriche, PA-C    Brief Narrative: 62 y.o. female with medical history significant for alcoholism, pancreatitis with pseudocyst, anxiety, depression, neuropathy, and recent diagnosis of pancreatic adenocarcinoma who presents for evaluation of elevated WBC on outpatient blood work.    Patient reports that she has had persistent epigastric pain and nausea since the recent hospital discharge.  She had eaten some grits yesterday, had severe epigastric pain following this that kept her up all night.  She then vomited a large volume this morning which relieved her pain.  She reports having a normal bowel movement this morning.  She denies any fevers or chills.  She has not noticed any change in the character or volume of her cholecystostomy tube drainage.    She saw a PA in the clinic today for hospital follow-up, had blood work performed, and received a call regarding elevated WBC.  She was advised to seek evaluation in the emergency department for this.   [facility] ED Course: Upon arrival to the ED, patient is found to be afebrile and saturating mid 90s on room air with mild tachycardia and SBP as low as 83.  Labs are most notable for WBC 17,400, platelets 679,000, alkaline phosphatase 515,000, normal lipase, and normal lactic acid.    CT- Interim placement of percutaneous cholecystostomy tube with decompression of the gallbladder.  Mixed findings of pancreatic mass and inflammatory change. Poorly defined infiltrative mass involving the pancreatic head and neck consistent with known pancreatic adenocarcinoma. Persistent stranding at the pancreatic bed consistent with pancreatic inflammatory process. Increased size of peripancreatic fluid collections compared to the most recent prior CT.  Distended stomach with wall thickening of the antrum and pylorus suspicious for inflammation. Possible  small duodenal ulcer but no perforation. Wall thickening and inflammation of the duodenal C sweep. Multiple hypodense liver lesions, reference prior MRI for further description and characterization. Occluded appearing splenic and superior mesenteric veins. 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 Numerous collateral vessels throughout the mesentery. These findings are present on the prior exam Patient was admitted to the hospital 12/08/2022 and discharged 12/18/2022, old records reviewed.  She underwent EGD and endoscopic ultrasound on 12/12/2022, pathology confirmed pancreatic adenocarcinoma .  Percutaneous cholecystostomy tube on 12/13/2022, water cath placed prior to discharge on 12/18/2022, she had shingles during that hospital stay Assessment & Plan:   Principal Problem:   Acute on chronic pancreatitis [facility]) Active Problems:   Thrombosis of splenic artery (HCC)   COPD (chronic obstructive pulmonary disease) (HCC)   Depression with anxiety   Acute esophagitis   Pancreatic adenocarcinoma (HCC)   SIRS (systemic inflammatory response syndrome) (HCC)   1. SIRS -patient was sent by her PCP to the ER due to findings of leukocytosis on outpatient labs.  At the time of admission her white count was 18 came down to 12.6 with IV fluids Vanco and Zosyn.  Patient was tachypneic tachycardic and hypotensive initially which improved with IV hydration.  Blood cultures were drawn and she was started on broad-spectrum antibiotics. ?  Unclear source of infection-UA negative for UTI positive for ketones and proteins  2. Pancreatic adenocarcinoma  - Recent diagnosis, Port has been placed and ready to use  - Patient has appt with Dr. Mosetta Putt of oncology scheduled for 12/2 per her report    3. Esophagitis; ?duodenal ulcer -recent EGD and endoscopic ultrasound with erosive esophagitis and duodenal ulcer without perforation or obstruction.  Continue PPI and Carafate.   4. Splenic vein thrombosis  - Continue Eliquis     5. GB distension  - Cholecystostomy tube in place, continue drain care     6. Depression, anxiety  - Continue Remeron and Seroquel     7. COPD  - Not in exacerbation  - Albuterol as needed   Estimated body mass index is 18.35 kg/m as calculated from the following:   Height as of this encounter: 5\' 3"  (1.6 m).   Weight as of this encounter: 47 kg.  DVT prophylaxis:eliquis Code Status: full Family Communication:none Disposition Plan:  Status is: Inpatient Remains inpatient appropriate because: SIRS   Consultants: onc  Procedures:none Antimicrobials: vanc zosyn  Subjective:  C/o nausea no bm  Cholecystostmy tube in place   Objective: Vitals:   12/23/22 2240 12/24/22 0158 12/24/22 0500 12/24/22 0630  BP: 98/60 111/66  103/65  Pulse: 94 92  89  Resp:      Temp: 97.7 F (36.5 C) 98.5 F (36.9 C)  98.4 F (36.9 C)  TempSrc: Oral Oral  Oral  SpO2: 94% 94%  96%  Weight:   47 kg   Height:        Intake/Output Summary (Last 24 hours) at 12/24/2022 1012 Last data filed at 12/24/2022 0524 Gross per 24 hour  Intake 245 ml  Output 150 ml  Net 95 ml   Filed Weights   12/23/22 1542 12/24/22 0500  Weight: 45.4 kg 47 kg    Examination:  General exam: Appears calm and comfortable  Respiratory system: Clear to auscultation. Respiratory effort normal. Cardiovascular system: S1 & S2 heard, RRR. No JVD, murmurs, rubs, gallops or clicks. No pedal edema. Gastrointestinal system: Abdomen is nondistended, soft and nontender. No organomegaly or masses felt. Normal bowel sounds heard.  Cholecystostomy tube in place Central nervous system: Alert and oriented. No focal neurological deficits. Extremities: No edema  Data Reviewed: I have personally reviewed following labs and imaging studies  CBC: Recent Labs  Lab 12/18/22 0635 12/23/22 1243 12/23/22 1616 12/24/22 0540  WBC 10.5 18.3 Repeated and verified X2.* 17.4* 12.6*  NEUTROABS  --  15.8* 14.7*  --   HGB 11.6*  12.8 12.0 10.6*  HCT 33.4* 38.7 34.7* 32.2*  MCV 96.3 100.2* 94.6 100.3*  PLT 397 760.0* 679* 560*   Basic Metabolic Panel: Recent Labs  Lab 12/23/22 1243 12/23/22 1616 12/24/22 0540  NA 134* 135 130*  K 4.0 3.7 3.6  CL 95* 93* 95*  CO2 32 31 27  GLUCOSE 116* 108* 98  BUN 12 13 14   CREATININE 0.48 0.43* 0.46  CALCIUM 9.4 9.2 8.1*  MG  --   --  2.0   GFR: Estimated Creatinine Clearance: 53.4 mL/min (by C-G formula based on SCr of 0.46 mg/dL). Liver Function Tests: Recent Labs  Lab 12/23/22 1243 12/23/22 1616 12/24/22 0540  AST 19 19 21   ALT 13 12 14   ALKPHOS 611* 515* 460*  BILITOT 0.8 0.9 1.1  PROT 5.9* 5.7* 5.2*  ALBUMIN 3.3* 3.1* 2.5*   Recent Labs  Lab 12/23/22 1616  LIPASE 43  AMYLASE 36   No results for input(s): "AMMONIA" in the last 168 hours. Coagulation Profile: No results for input(s): "INR", "PROTIME" in the last 168 hours. Cardiac Enzymes: No results for input(s): "CKTOTAL", "CKMB", "CKMBINDEX", "TROPONINI" in the last 168 hours. BNP (last 3 results) No results for input(s): "PROBNP" in the last 8760 hours. HbA1C: No results for input(s): "HGBA1C" in the last 72 hours.  CBG: Recent Labs  Lab 12/17/22 1159 12/17/22 1709 12/18/22 0032 12/18/22 0604 12/18/22 1128  GLUCAP 133* 126* 116* 94 99   Lipid Profile: No results for input(s): "CHOL", "HDL", "LDLCALC", "TRIG", "CHOLHDL", "LDLDIRECT" in the last 72 hours. Thyroid Function Tests: No results for input(s): "TSH", "T4TOTAL", "FREET4", "T3FREE", "THYROIDAB" in the last 72 hours. Anemia Panel: No results for input(s): "VITAMINB12", "FOLATE", "FERRITIN", "TIBC", "IRON", "RETICCTPCT" in the last 72 hours. Sepsis Labs: Recent Labs  Lab 12/23/22 1616  LATICACIDVEN 0.8    Recent Results (from the past 240 hour(s))  Culture, blood (routine x 2)     Status: None (Preliminary result)   Collection Time: 12/23/22  3:55 PM   Specimen: BLOOD  Result Value Ref Range Status   Specimen  Description   Final    BLOOD Left Chest Port Performed at [facility], [address] [place], [place] [address]    Special Requests   Final    Blood Culture results may not be optimal due to an inadequate volume of blood received in culture bottles BOTTLES DRAWN AEROBIC AND ANAEROBIC Performed at [facility], [address] [place], [place] [address]    Culture   Final    NO GROWTH < 24 HOURS Performed at [facility], [address], [place], [place] [address]    Report Status PENDING  Incomplete         Radiology Studies: CT ABDOMEN PELVIS W CONTRAST  Result Date: 12/23/2022 CLINICAL DATA:  Status post recent drain placement pancreas cancer continued pain EXAM: CT ABDOMEN AND PELVIS WITH CONTRAST TECHNIQUE: Multidetector CT imaging of the abdomen and pelvis was performed using the standard protocol following bolus administration of intravenous contrast. RADIATION DOSE REDUCTION: This exam was performed according to the departmental dose-optimization program which includes automated exposure control, adjustment of the mA and/or kV according to patient size and/or use of iterative reconstruction technique. CONTRAST:  85mL OMNIPAQUE IOHEXOL 300 MG/ML  SOLN COMPARISON:  CT 12/12/2022, MRI 12/09/2022, CT 12/08/2022, 07/24/2021 FINDINGS: Lower chest: Lung bases demonstrate no acute airspace disease. Similar small volume pericardial effusion. Small hiatal hernia and mild distal esophageal thickening. Small distal esophageal varices. Hepatobiliary: Multiple hypodense liver lesions, reference prior MRI for further description and characterisation. Interim placement of percutaneous cholecystostomy tube with decompression of the gallbladder. Heterogeneous enhancement pattern of the liver which may be due to combination of steatosis and perfusion abnormality. Central intra hepatic ducts and extrahepatic biliary dilatation, common duct measures up to 15 mm. Abrupt  ductal cut off on coronal views due to pancreatic mass lesion. Pancreas: Poorly defined infiltrative mass involving the pancreatic head and neck, measures roughly 6 by 4.8 cm on series 2, image 28. Distal pancreatic atrophy with ductal dilatation. Soft tissue stranding at the pancreatic bed consistent with inflammation. Increased size of fluid collection between pancreas and stomach, now measuring 6.5 by 4.7 cm, previously 5.6 by 2.5 cm. Spleen: Normal in size without focal abnormality. Adrenals/Urinary Tract: Stable adrenal glands with diffuse thickening but no dominant mass. Kidneys show no hydronephrosis. The bladder is normal Stomach/Bowel: Moderate fluid distension of the stomach. Wall thickening of the pylorus and duodenal C sweep with inflammation. Focal wall thickening and area of fluid near duodenal bulb, coronal series 5, image 62, potentially due to an area of ulceration. No free gas in the street gin. Remainder of the bowel shows no evidence for obstruction or wall thickening Vascular/Lymphatic: Advanced aortic atherosclerosis. No aneurysm. Narrowing of superior mesenteric artery  by infiltrative pancreas mass. Occluded appearing splenic and superior mesenteric veins. 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 Numerous collateral vessels throughout the mesentery. Reproductive: Calcified uterine fibroids.  No adnexal mass Other: No free air. No large volume pelvic ascites. Increased organized fluid collection between the head of pancreas and the second portion of duodenum, this now measures 4 x 2.6 cm, previously 2.4 x 2.2 cm. Musculoskeletal: No acute osseous abnormality. IMPRESSION: 1. Interim placement of percutaneous cholecystostomy tube with decompression of the gallbladder. 2. Mixed findings of pancreatic mass and inflammatory change. Poorly defined infiltrative mass involving the pancreatic head and neck consistent with known pancreatic adenocarcinoma. Persistent stranding at the pancreatic bed consistent  with pancreatic inflammatory process. Increased size of peripancreatic fluid collections compared to the most recent prior CT. 3. Distended stomach with wall thickening of the antrum and pylorus suspicious for inflammation. Possible small duodenal ulcer but no perforation. Wall thickening and inflammation of the duodenal C sweep. 4. Multiple hypodense liver lesions, reference prior MRI for further description and characterization. 5. Occluded appearing splenic and superior mesenteric veins. 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 Numerous collateral vessels throughout the mesentery. These findings are present on the prior exam 6. Aortic atherosclerosis. Aortic Atherosclerosis (ICD10-I70.0). Electronically Signed   By: Jasmine Pang M.D.   On: 12/23/2022 18:51     Scheduled Meds:  apixaban  10 mg Oral BID   Followed by   Melene Muller ON 12/25/2022] apixaban  5 mg Oral BID   Chlorhexidine Gluconate Cloth  6 each Topical Daily   gabapentin  900 mg Oral QHS   mirtazapine  45 mg Oral QHS   pantoprazole (PROTONIX) IV  40 mg Intravenous Q12H   QUEtiapine  100-200 mg Oral QHS   sodium chloride flush  3 mL Intravenous Q12H   sucralfate  1 g Oral TID WC & HS   Continuous Infusions:  lactated ringers 75 mL/hr at 12/24/22 0132   piperacillin-tazobactam (ZOSYN)  IV 3.375 g (12/24/22 0524)     LOS: 1 day    Time spent: 38 min  Alwyn Ren, MD  12/24/2022, 10:12 AM

## 2022-12-25 DIAGNOSIS — K859 Acute pancreatitis without necrosis or infection, unspecified: Secondary | ICD-10-CM | POA: Diagnosis not present

## 2022-12-25 DIAGNOSIS — K861 Other chronic pancreatitis: Secondary | ICD-10-CM | POA: Diagnosis not present

## 2022-12-25 LAB — COMPREHENSIVE METABOLIC PANEL
ALT: 12 U/L (ref 0–44)
AST: 22 U/L (ref 15–41)
Albumin: 2.3 g/dL — ABNORMAL LOW (ref 3.5–5.0)
Alkaline Phosphatase: 421 U/L — ABNORMAL HIGH (ref 38–126)
Anion gap: 5 (ref 5–15)
BUN: 11 mg/dL (ref 8–23)
CO2: 27 mmol/L (ref 22–32)
Calcium: 8.1 mg/dL — ABNORMAL LOW (ref 8.9–10.3)
Chloride: 99 mmol/L (ref 98–111)
Creatinine, Ser: 0.5 mg/dL (ref 0.44–1.00)
GFR, Estimated: >60 mL/min (ref >60)
Glucose, Bld: 103 mg/dL — ABNORMAL HIGH (ref 70–99)
Potassium: 3.8 mmol/L (ref 3.5–5.1)
Sodium: 131 mmol/L — ABNORMAL LOW (ref 135–145)
Total Bilirubin: 1 mg/dL (ref <1.2)
Total Protein: 4.6 g/dL — ABNORMAL LOW (ref 6.5–8.1)

## 2022-12-25 LAB — CBC
HCT: 29.5 % — ABNORMAL LOW (ref 36.0–46.0)
Hemoglobin: 10.1 g/dL — ABNORMAL LOW (ref 12.0–15.0)
MCH: 33.9 pg (ref 26.0–34.0)
MCHC: 34.2 g/dL (ref 30.0–36.0)
MCV: 99 fL (ref 80.0–100.0)
Platelets: 501 10*3/uL — ABNORMAL HIGH (ref 150–400)
RBC: 2.98 MIL/uL — ABNORMAL LOW (ref 3.87–5.11)
RDW: 14.3 % (ref 11.5–15.5)
WBC: 9.7 10*3/uL (ref 4.0–10.5)
nRBC: 0 % (ref 0.0–0.2)

## 2022-12-25 LAB — LIPASE, BLOOD: Lipase: 35 U/L (ref 11–51)

## 2022-12-25 LAB — MRSA NEXT GEN BY PCR, NASAL: MRSA by PCR Next Gen: NOT DETECTED

## 2022-12-25 MED ORDER — LACTATED RINGERS IV SOLN: INTRAVENOUS | Status: AC

## 2022-12-25 NOTE — TOC Initial Note (Addendum)
Transition of Care [facility]) - Initial/Assessment Note    Patient Details  Name: Kathryn Underwood MRN: [id number] Date of Birth: [birth year]  Transition of Care [facility]) CM/SW Contact:    Beckie Busing, RN Phone Number:[phone]  12/25/2022, 11:30 AM  Clinical Narrative:                 TOC following patient with high risk for readmission. Patient is from home where she functions independently . Patient has no DME or HH needs. [facility] choice [facility]. Patient states that she does have access to medications and they are affordable. Patient does confirm that she has PCP (Allwardt, Alyssa M, PA-C ). Patient has no TOC needs noted at this time.   Patient Goals and CMS Choice Patient states their goals for this hospitalization and ongoing recovery are:: Patient wants to get better and go home CMS Medicare.gov Compare Post Acute Care list provided to::  (n/a)   [place] ownership interest in  [facility].provided to::  (n/a)    Expected Discharge Plan and Services In-house Referral: NA Discharge Planning Services: CM Consult Post Acute Care Choice: NA Living arrangements for the past 2 months: Single Family Home                 DME Arranged: N/A DME Agency: NA       HH Arranged: NA HH Agency: NA        Prior Living Arrangements/Services Living arrangements for the past 2 months: Single Family Home Lives with:: Significant Other Patient language and need for interpreter reviewed:: Yes        Need for Family Participation in Patient Care: No (Comment) Care giver support system in place?: Yes (comment) Current home services:  (n/a) Criminal Activity/Legal Involvement Pertinent to Current Situation/Hospitalization: No - Comment as needed  Activities of Daily Living   ADL Screening (condition at time of admission) Independently performs ADLs?: Yes (appropriate for developmental age) Is the patient deaf or have difficulty hearing?: No Does the patient have  difficulty seeing, even when wearing glasses/contacts?: No Does the patient have difficulty concentrating, remembering, or making decisions?: No  Permission Sought/Granted Permission sought to share information with : Family Supports Permission granted to share information with : No              Emotional Assessment Appearance:: Appears stated age Attitude/Demeanor/Rapport: Gracious Affect (typically observed): Accepting Orientation: : Oriented to Self, Oriented to Place, Oriented to  Time, Oriented to Situation Alcohol / Substance Use: Alcohol Use Psych Involvement: No (comment)  Admission diagnosis:  Intra-abdominal infection [B99.9] Abdominal pain [R10.9] Malignant neoplasm of pancreas, unspecified location of malignancy [facility]) [C25.9] Patient Active Problem List   Diagnosis Date Noted   SIRS (systemic inflammatory response syndrome) (HCC) 12/24/2022   Abdominal pain 12/23/2022   Pancreatic adenocarcinoma (HCC) 12/16/2022   Biliary obstruction 12/15/2022   Gastric outlet obstruction 12/15/2022   High serum carbohydrate antigen 19-9 (CA19-9) 12/12/2022   Pancreatic cyst 12/12/2022   Abnormal CT scan, gastrointestinal tract 12/11/2022   Acute esophagitis 12/10/2022   Acute on chronic pancreatitis (HCC) 12/09/2022   Protein malnutrition (HCC) 12/09/2022   Unintentional weight loss 12/09/2022   Esophageal dysphagia 12/09/2022   Dilation of pancreatic duct 12/09/2022   Protein-calorie malnutrition, severe 12/09/2022   Pancreatic mass 12/08/2022   Multifocal atrial tachycardia (HCC) 08/31/2021   Delirium tremens (HCC) 07/27/2021   Pancreas hemorrhage 07/27/2021   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   Uterine fibroid 07/27/2021  COPD (chronic obstructive pulmonary disease) (HCC) 07/27/2021   Insomnia 07/27/2021   Pulmonary nodule 07/25/2021   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/25/2021   Alcohol induced acute pancreatitis without necrosis or infection 10/05/2017    Alcoholic ketoacidosis 10/04/2017   Epigastric pain 10/04/2017   MDD (major depressive disorder), recurrent severe, 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   Alcohol withdrawal (HCC) 04/25/2016   Sinus tachycardia 04/25/2016   Transaminitis 04/25/2016   Leukocytosis 04/25/2016   Closed nondisplaced fracture of fifth right metatarsal bone 04/08/2016   Acute pain of right shoulder 04/08/2016   Vitamin D deficiency 12/11/2015   Alcohol dependence with unspecified alcohol-induced disorder (HCC) 12/09/2015   Fatigue 12/09/2015   Encounter for alcohol abuse counseling and surveillance 12/09/2015   Cough 01/04/2015   Tobacco abuse 01/04/2015   Depression with anxiety 01/04/2015   PCP:  Bary Leriche, PA-C [facility]   [phone] [place] [place] [address] [place] [address] Phone: [phone] Fax: [phone]  [facility] [address] [place] [address] Phone: [phone] Fax: [phone]  [facility] [address] [place] [address] Phone: [phone] Fax: [phone]  [facility] [address] [place] [address] Phone: [phone] Fax: [phone]  [facility] [address] [place] [address] Phone: [phone] Fax: [phone]     Social Determinants of Health (SDOH) Social History: SDOH Screenings   Food Insecurity: No Food Insecurity (12/23/2022)  Housing: Low Risk  (12/23/2022)  Transportation Needs: No Transportation Needs (12/23/2022)  Utilities: Not At Risk (12/23/2022)  Tobacco Use: High Risk (12/24/2022)   SDOH Interventions:     Readmission Risk Interventions    12/25/2022   10:56 AM  Readmission Risk Prevention Plan  Transportation Screening Complete  PCP or Specialist Appt within 3-5 Days  Complete  HRI or Home Care Consult Complete  Social Work Consult for Recovery Care Planning/Counseling Complete  Palliative Care Screening Not Applicable  Medication Review Oceanographer) Referral to Pharmacy

## 2022-12-25 NOTE — Progress Notes (Signed)
Kathryn Underwood   DOB:1960/11/27   IO#:[id number]   LKG#:[id number]  Medical oncology follow-up  Subjective: I met patient during her last hospital admission.  She was discharged home on liquid diet.  She presented with worsening abdominal pain the night before her admission, and the pain resolved after she threw up half a gallon of liquid.  She was seen by her primary care physician the next day, and was sent to the hospital due to leukocytosis.  She denies fever.  She was restarted on liquid diet this morning, so far tolerating well.  She has been having good bowel movement.   Objective:  Vitals:   12/24/22 2136 12/25/22 0335  BP: 113/73 119/75  Pulse: 81 86  Resp: 18 18  Temp: 98.3 F (36.8 C) 98.2 F (36.8 C)  SpO2: 95% 95%    Body mass index is 18.35 kg/m.  Intake/Output Summary (Last 24 hours) at 12/25/2022 1315 Last data filed at 12/25/2022 1050 Gross per 24 hour  Intake 2067.32 ml  Output 305 ml  Net 1762.32 ml     Sclerae unicteric  Oropharynx clear  No peripheral adenopathy  Lungs clear -- no rales or rhonchi  Heart regular rate and rhythm  Abdomen soft with mild diffuse tenderness.  Gallbladder drainage tube on the right side  MSK no focal spinal tenderness, no peripheral edema  Neuro nonfocal    CBG (last 3)  No results for input(s): "GLUCAP" in the last 72 hours.   Labs:    Urine Studies No results for input(s): "UHGB", "CRYS" in the last 72 hours.  Invalid input(s): "UACOL", "UAPR", "USPG", "UPH", "UTP", "UGL", "UKET", "UBIL", "UNIT", "UROB", "ULEU", "UEPI", "UWBC", "URBC", "UBAC", "CAST", "UCOM", "BILUA"  Basic Metabolic Panel: Recent Labs  Lab 12/23/22 1243 12/23/22 1616 12/24/22 0540 12/25/22 0500  NA 134* 135 130* 131*  K 4.0 3.7 3.6 3.8  CL 95* 93* 95* 99  CO2 32 31 27 27   GLUCOSE 116* 108* 98 103*  BUN 12 13 14 11   CREATININE 0.48 0.43* 0.46 0.50  CALCIUM 9.4 9.2 8.1* 8.1*  MG  --   --  2.0  --    GFR Estimated Creatinine Clearance:  53.4 mL/min (by C-G formula based on SCr of 0.5 mg/dL). Liver Function Tests: Recent Labs  Lab 12/23/22 1243 12/23/22 1616 12/24/22 0540 12/25/22 0500  AST 19 19 21 22   ALT 13 12 14 12   ALKPHOS 611* 515* 460* 421*  BILITOT 0.8 0.9 1.1 1.0  PROT 5.9* 5.7* 5.2* 4.6*  ALBUMIN 3.3* 3.1* 2.5* 2.3*   Recent Labs  Lab 12/23/22 1616 12/25/22 0500  LIPASE 43 35  AMYLASE 36  --    No results for input(s): "AMMONIA" in the last 168 hours. Coagulation profile No results for input(s): "INR", "PROTIME" in the last 168 hours.  CBC: Recent Labs  Lab 12/23/22 1243 12/23/22 1616 12/24/22 0540 12/25/22 0500  WBC 18.3 Repeated and verified X2.* 17.4* 12.6* 9.7  NEUTROABS 15.8* 14.7*  --   --   HGB 12.8 12.0 10.6* 10.1*  HCT 38.7 34.7* 32.2* 29.5*  MCV 100.2* 94.6 100.3* 99.0  PLT 760.0* 679* 560* 501*   Cardiac Enzymes: No results for input(s): "CKTOTAL", "CKMB", "CKMBINDEX", "TROPONINI" in the last 168 hours. BNP: Invalid input(s): "POCBNP" CBG: No results for input(s): "GLUCAP" in the last 168 hours. D-Dimer No results for input(s): "DDIMER" in the last 72 hours. Hgb A1c No results for input(s): "HGBA1C" in the last 72 hours. Lipid Profile No results  for input(s): "CHOL", "HDL", "LDLCALC", "TRIG", "CHOLHDL", "LDLDIRECT" in the last 72 hours. Thyroid function studies No results for input(s): "TSH", "T4TOTAL", "T3FREE", "THYROIDAB" in the last 72 hours.  Invalid input(s): "FREET3" Anemia work up No results for input(s): "VITAMINB12", "FOLATE", "FERRITIN", "TIBC", "IRON", "RETICCTPCT" in the last 72 hours. Microbiology Recent Results (from the past 240 hour(s))  Culture, blood (routine x 2)     Status: None (Preliminary result)   Collection Time: 12/23/22  3:55 PM   Specimen: BLOOD  Result Value Ref Range Status   Specimen Description   Final    BLOOD Left Chest Port Performed at [facility], [address] [place], [place] [address]    Special Requests   Final     Blood Culture results may not be optimal due to an inadequate volume of blood received in culture bottles BOTTLES DRAWN AEROBIC AND ANAEROBIC Performed at [facility], [address] [place], [place] [address]    Culture   Final    NO GROWTH 2 DAYS Performed at [facility], [address] [place], [place] [address]    Report Status PENDING  Incomplete  Culture, blood (routine x 2)     Status: None (Preliminary result)   Collection Time: 12/24/22  8:45 AM   Specimen: BLOOD RIGHT ARM  Result Value Ref Range Status   Specimen Description   Final    BLOOD RIGHT ARM BOTTLES DRAWN AEROBIC AND ANAEROBIC Performed at [facility], [address], [place], [place] [address]    Special Requests   Final    Blood Culture adequate volume Performed at [facility], [address] [place], [place] [address]    Culture   Final    NO GROWTH < 24 HOURS Performed at [facility], [address], [place], [place] [address]    Report Status PENDING  Incomplete      Studies:  CT ABDOMEN PELVIS W CONTRAST  Result Date: 12/23/2022 CLINICAL DATA:  Status post recent drain placement pancreas cancer continued pain EXAM: CT ABDOMEN AND PELVIS WITH CONTRAST TECHNIQUE: Multidetector CT imaging of the abdomen and pelvis was performed using the standard protocol following bolus administration of intravenous contrast. RADIATION DOSE REDUCTION: This exam was performed according to the departmental dose-optimization program which includes automated exposure control, adjustment of the mA and/or kV according to patient size and/or use of iterative reconstruction technique. CONTRAST:  85mL OMNIPAQUE IOHEXOL 300 MG/ML  SOLN COMPARISON:  CT 12/12/2022, MRI 12/09/2022, CT 12/08/2022, 07/24/2021 FINDINGS: Lower chest: Lung bases demonstrate no acute airspace disease. Similar small volume pericardial effusion. Small hiatal hernia and mild distal  esophageal thickening. Small distal esophageal varices. Hepatobiliary: Multiple hypodense liver lesions, reference prior MRI for further description and characterisation. Interim placement of percutaneous cholecystostomy tube with decompression of the gallbladder. Heterogeneous enhancement pattern of the liver which may be due to combination of steatosis and perfusion abnormality. Central intra hepatic ducts and extrahepatic biliary dilatation, common duct measures up to 15 mm. Abrupt ductal cut off on coronal views due to pancreatic mass lesion. Pancreas: Poorly defined infiltrative mass involving the pancreatic head and neck, measures roughly 6 by 4.8 cm on series 2, image 28. Distal pancreatic atrophy with ductal dilatation. Soft tissue stranding at the pancreatic bed consistent with inflammation. Increased size of fluid collection between pancreas and stomach, now measuring 6.5 by 4.7 cm, previously 5.6 by 2.5 cm. Spleen: Normal in size without focal abnormality. Adrenals/Urinary Tract:  Stable adrenal glands with diffuse thickening but no dominant mass. Kidneys show no hydronephrosis. The bladder is normal Stomach/Bowel: Moderate fluid distension of the stomach. Wall thickening of the pylorus and duodenal C sweep with inflammation. Focal wall thickening and area of fluid near duodenal bulb, coronal series 5, image 62, potentially due to an area of ulceration. No free gas in the street gin. Remainder of the bowel shows no evidence for obstruction or wall thickening Vascular/Lymphatic: Advanced aortic atherosclerosis. No aneurysm. Narrowing of superior mesenteric artery by infiltrative pancreas mass. Occluded appearing splenic and superior mesenteric veins. 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 Numerous collateral vessels throughout the mesentery. Reproductive: Calcified uterine fibroids.  No adnexal mass Other: No free air. No large volume pelvic ascites. Increased organized fluid collection between the head of  pancreas and the second portion of duodenum, this now measures 4 x 2.6 cm, previously 2.4 x 2.2 cm. Musculoskeletal: No acute osseous abnormality. IMPRESSION: 1. Interim placement of percutaneous cholecystostomy tube with decompression of the gallbladder. 2. Mixed findings of pancreatic mass and inflammatory change. Poorly defined infiltrative mass involving the pancreatic head and neck consistent with known pancreatic adenocarcinoma. Persistent stranding at the pancreatic bed consistent with pancreatic inflammatory process. Increased size of peripancreatic fluid collections compared to the most recent prior CT. 3. Distended stomach with wall thickening of the antrum and pylorus suspicious for inflammation. Possible small duodenal ulcer but no perforation. Wall thickening and inflammation of the duodenal C sweep. 4. Multiple hypodense liver lesions, reference prior MRI for further description and characterization. 5. Occluded appearing splenic and superior mesenteric veins. 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 Numerous collateral vessels throughout the mesentery. These findings are present on the prior exam 6. Aortic atherosclerosis. Aortic Atherosclerosis (ICD10-I70.0). Electronically Signed   By: Jasmine Pang M.D.   On: 12/23/2022 18:51    Assessment: 62 y.o. female  Leukocytosis, infection versus reactive Newly diagnosed pancreatic cancer Probable partial gastric outlet obstruction Acute cholecystitis, status post gallbladder draining tube replacement Moderate protein and calorie malnutrition Depression and anxiety COPD History of alcohol abuse    Plan:  -I have reviewed her lab and CT scan images -Based on her symptom and CT scan, I suspect that she has partial gastric outlet obstruction from pancreatic cancer and pancreatic cysts -Please consult GI to consider duodenal stent placement, I previously discussed with Dr. Meridee Score -Her case was reviewed in our GI tumor board a few days ago, we  recommended neoadjuvant chemotherapy.  Unfortunately if her nutrition and GI symptoms does not resolve or recurs, I may not be able to start her on chemotherapy. -I will follow-up on Monday if she is doing hospital.   Malachy Mood, MD 12/25/2022  1:15 PM

## 2022-12-25 NOTE — Plan of Care (Signed)

## 2022-12-25 NOTE — Progress Notes (Signed)
PROGRESS NOTE    Kathryn Underwood  ZOX:[id number] DOB: 1960/06/29 DOA: 12/23/2022 PCP: Bary Leriche, PA-C    Brief Narrative: 62 y.o. female with medical history significant for alcoholism, pancreatitis with pseudocyst, anxiety, depression, neuropathy, and recent diagnosis of pancreatic adenocarcinoma who presents for evaluation of elevated WBC on outpatient blood work.    Patient reports that she has had persistent epigastric pain and nausea since the recent hospital discharge.  She had eaten some grits yesterday, had severe epigastric pain following this that kept her up all night.  She then vomited a large volume this morning which relieved her pain.  She reports having a normal bowel movement this morning.  She denies any fevers or chills.  She has not noticed any change in the character or volume of her cholecystostomy tube drainage.    She saw a PA in the clinic today for hospital follow-up, had blood work performed, and received a call regarding elevated WBC.  She was advised to seek evaluation in the emergency department for this.   [facility] ED Course: Upon arrival to the ED, patient is found to be afebrile and saturating mid 90s on room air with mild tachycardia and SBP as low as 83.  Labs are most notable for WBC 17,400, platelets 679,000, alkaline phosphatase 515,000, normal lipase, and normal lactic acid.    CT- Interim placement of percutaneous cholecystostomy tube with decompression of the gallbladder.  Mixed findings of pancreatic mass and inflammatory change. Poorly defined infiltrative mass involving the pancreatic head and neck consistent with known pancreatic adenocarcinoma. Persistent stranding at the pancreatic bed consistent with pancreatic inflammatory process. Increased size of peripancreatic fluid collections compared to the most recent prior CT.  Distended stomach with wall thickening of the antrum and pylorus suspicious for inflammation. Possible  small duodenal ulcer but no perforation. Wall thickening and inflammation of the duodenal C sweep. Multiple hypodense liver lesions, reference prior MRI for further description and characterization. Occluded appearing splenic and superior mesenteric veins. 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 Numerous collateral vessels throughout the mesentery. These findings are present on the prior exam Patient was admitted to the hospital 12/08/2022 and discharged 12/18/2022, old records reviewed.  She underwent EGD and endoscopic ultrasound on 12/12/2022, pathology confirmed pancreatic adenocarcinoma .  Percutaneous cholecystostomy tube on 12/13/2022, water cath placed prior to discharge on 12/18/2022, she had shingles during that hospital stay Assessment & Plan:   Principal Problem:   Acute on chronic pancreatitis [facility]) Active Problems:   Thrombosis of splenic artery (HCC)   COPD (chronic obstructive pulmonary disease) (HCC)   Depression with anxiety   Acute esophagitis   Pancreatic adenocarcinoma (HCC)   SIRS (systemic inflammatory response syndrome) (HCC)   1. SIRS -patient was sent by her PCP to the ER due to findings of leukocytosis on outpatient labs.  At the time of admission her white count was 18 came down to 12.6 with IV fluids Vanco and Zosyn.  Patient was tachypneic tachycardic and hypotensive initially which improved with IV hydration.  Blood cultures were drawn and she was started on broad-spectrum antibiotics. ?  Unclear source of infection-UA negative for UTI positive for ketones and proteins  2. Pancreatic adenocarcinoma  - Recent diagnosis, Port has been placed and ready to use  - Patient has appt with Dr. Mosetta Putt of oncology scheduled for 12/2 per her report  -Informed Dr. Mosetta Putt of patient's admission and appreciate her input   3. Esophagitis; ?duodenal ulcer -recent EGD and endoscopic ultrasound with  erosive esophagitis and duodenal ulcer without perforation or obstruction.  Continue  PPI and Carafate.   4. Splenic vein thrombosis  - Continue Eliquis    5. GB distension  - Cholecystostomy tube in place, continue drain care     6. Depression, anxiety  - Continue Remeron and Seroquel     7. COPD  - Not in exacerbation  - Albuterol as needed   Estimated body mass index is 18.35 kg/m as calculated from the following:   Height as of this encounter: 5\' 3"  (1.6 m).   Weight as of this encounter: 47 kg.  DVT prophylaxis:eliquis Code Status: full Family Communication:none Disposition Plan:  Status is: Inpatient Remains inpatient appropriate because: SIRS   Consultants: onc  Procedures:none Antimicrobials:  zosyn  Subjective:  Anxious to try to advance diet Drain noted with greenish clear drainage  Objective: Vitals:   12/24/22 0630 12/24/22 1331 12/24/22 2136 12/25/22 0335  BP: 103/65 110/75 113/73 119/75  Pulse: 89 82 81 86  Resp:  16 18 18   Temp: 98.4 F (36.9 C) 98.3 F (36.8 C) 98.3 F (36.8 C) 98.2 F (36.8 C)  TempSrc: Oral Oral Oral Oral  SpO2: 96% 94% 95% 95%  Weight:      Height:        Intake/Output Summary (Last 24 hours) at 12/25/2022 1156 Last data filed at 12/25/2022 1050 Gross per 24 hour  Intake 2067.32 ml  Output 305 ml  Net 1762.32 ml   Filed Weights   12/23/22 1542 12/24/22 0500  Weight: 45.4 kg 47 kg    Examination:  General exam: Appears calm and comfortable  Respiratory system: Clear to auscultation. Respiratory effort normal. Cardiovascular system: S1 & S2 heard, RRR. No JVD, murmurs, rubs, gallops or clicks. No pedal edema. Gastrointestinal system: Abdomen is nondistended, soft and nontender. No organomegaly or masses felt. Normal bowel sounds heard.  Cholecystostomy tube in place Central nervous system: Alert and oriented. No focal neurological deficits. Extremities: No edema  Data Reviewed: I have personally reviewed following labs and imaging studies  CBC: Recent Labs  Lab 12/23/22 1243 12/23/22 1616  12/24/22 0540 12/25/22 0500  WBC 18.3 Repeated and verified X2.* 17.4* 12.6* 9.7  NEUTROABS 15.8* 14.7*  --   --   HGB 12.8 12.0 10.6* 10.1*  HCT 38.7 34.7* 32.2* 29.5*  MCV 100.2* 94.6 100.3* 99.0  PLT 760.0* 679* 560* 501*   Basic Metabolic Panel: Recent Labs  Lab 12/23/22 1243 12/23/22 1616 12/24/22 0540 12/25/22 0500  NA 134* 135 130* 131*  K 4.0 3.7 3.6 3.8  CL 95* 93* 95* 99  CO2 32 31 27 27   GLUCOSE 116* 108* 98 103*  BUN 12 13 14 11   CREATININE 0.48 0.43* 0.46 0.50  CALCIUM 9.4 9.2 8.1* 8.1*  MG  --   --  2.0  --    GFR: Estimated Creatinine Clearance: 53.4 mL/min (by C-G formula based on SCr of 0.5 mg/dL). Liver Function Tests: Recent Labs  Lab 12/23/22 1243 12/23/22 1616 12/24/22 0540 12/25/22 0500  AST 19 19 21 22   ALT 13 12 14 12   ALKPHOS 611* 515* 460* 421*  BILITOT 0.8 0.9 1.1 1.0  PROT 5.9* 5.7* 5.2* 4.6*  ALBUMIN 3.3* 3.1* 2.5* 2.3*   Recent Labs  Lab 12/23/22 1616 12/25/22 0500  LIPASE 43 35  AMYLASE 36  --    No results for input(s): "AMMONIA" in the last 168 hours. Coagulation Profile: No results for input(s): "INR", "PROTIME" in the last 168  hours. Cardiac Enzymes: No results for input(s): "CKTOTAL", "CKMB", "CKMBINDEX", "TROPONINI" in the last 168 hours. BNP (last 3 results) No results for input(s): "PROBNP" in the last 8760 hours. HbA1C: No results for input(s): "HGBA1C" in the last 72 hours. CBG: No results for input(s): "GLUCAP" in the last 168 hours.  Lipid Profile: No results for input(s): "CHOL", "HDL", "LDLCALC", "TRIG", "CHOLHDL", "LDLDIRECT" in the last 72 hours. Thyroid Function Tests: No results for input(s): "TSH", "T4TOTAL", "FREET4", "T3FREE", "THYROIDAB" in the last 72 hours. Anemia Panel: No results for input(s): "VITAMINB12", "FOLATE", "FERRITIN", "TIBC", "IRON", "RETICCTPCT" in the last 72 hours. Sepsis Labs: Recent Labs  Lab 12/23/22 1616  LATICACIDVEN 0.8    Recent Results (from the past 240 hour(s))   Culture, blood (routine x 2)     Status: None (Preliminary result)   Collection Time: 12/23/22  3:55 PM   Specimen: BLOOD  Result Value Ref Range Status   Specimen Description   Final    BLOOD Left Chest Port Performed at [facility], [address] [place], [place] [address]    Special Requests   Final    Blood Culture results may not be optimal due to an inadequate volume of blood received in culture bottles BOTTLES DRAWN AEROBIC AND ANAEROBIC Performed at [facility], [address], [place], [place] [address]    Culture   Final    NO GROWTH 2 DAYS Performed at [facility], [address], [place], [place] [address]    Report Status PENDING  Incomplete  Culture, blood (routine x 2)     Status: None (Preliminary result)   Collection Time: 12/24/22  8:45 AM   Specimen: BLOOD RIGHT ARM  Result Value Ref Range Status   Specimen Description   Final    BLOOD RIGHT ARM BOTTLES DRAWN AEROBIC AND ANAEROBIC Performed at [facility], [address], [place], [place] [address]    Special Requests   Final    Blood Culture adequate volume Performed at [facility], [address], [place], [place] [address]    Culture   Final    NO GROWTH < 24 HOURS Performed at [facility], [address], [place], [place] [address]    Report Status PENDING  Incomplete         Radiology Studies: CT ABDOMEN PELVIS W CONTRAST  Result Date: 12/23/2022 CLINICAL DATA:  Status post recent drain placement pancreas cancer continued pain EXAM: CT ABDOMEN AND PELVIS WITH CONTRAST TECHNIQUE: Multidetector CT imaging of the abdomen and pelvis was performed using the standard protocol following bolus administration of intravenous contrast. RADIATION DOSE REDUCTION: This exam was performed according to the departmental dose-optimization program which includes automated exposure control, adjustment of the mA and/or kV according to  patient size and/or use of iterative reconstruction technique. CONTRAST:  85mL OMNIPAQUE IOHEXOL 300 MG/ML  SOLN COMPARISON:  CT 12/12/2022, MRI 12/09/2022, CT 12/08/2022, 07/24/2021 FINDINGS: Lower chest: Lung bases demonstrate no acute airspace disease. Similar small volume pericardial effusion. Small hiatal hernia and mild distal esophageal thickening. Small distal esophageal varices. Hepatobiliary: Multiple hypodense liver lesions, reference prior MRI for further description and characterisation. Interim placement of percutaneous cholecystostomy tube with decompression of the gallbladder. Heterogeneous enhancement pattern of the liver which may be due to combination of steatosis and perfusion abnormality. Central intra hepatic ducts and extrahepatic biliary dilatation, common duct measures up to 15 mm. Abrupt ductal cut off on coronal views due to pancreatic  mass lesion. Pancreas: Poorly defined infiltrative mass involving the pancreatic head and neck, measures roughly 6 by 4.8 cm on series 2, image 28. Distal pancreatic atrophy with ductal dilatation. Soft tissue stranding at the pancreatic bed consistent with inflammation. Increased size of fluid collection between pancreas and stomach, now measuring 6.5 by 4.7 cm, previously 5.6 by 2.5 cm. Spleen: Normal in size without focal abnormality. Adrenals/Urinary Tract: Stable adrenal glands with diffuse thickening but no dominant mass. Kidneys show no hydronephrosis. The bladder is normal Stomach/Bowel: Moderate fluid distension of the stomach. Wall thickening of the pylorus and duodenal C sweep with inflammation. Focal wall thickening and area of fluid near duodenal bulb, coronal series 5, image 62, potentially due to an area of ulceration. No free gas in the street gin. Remainder of the bowel shows no evidence for obstruction or wall thickening Vascular/Lymphatic: Advanced aortic atherosclerosis. No aneurysm. Narrowing of superior mesenteric artery by infiltrative  pancreas mass. Occluded appearing splenic and superior mesenteric veins. 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 Numerous collateral vessels throughout the mesentery. Reproductive: Calcified uterine fibroids.  No adnexal mass Other: No free air. No large volume pelvic ascites. Increased organized fluid collection between the head of pancreas and the second portion of duodenum, this now measures 4 x 2.6 cm, previously 2.4 x 2.2 cm. Musculoskeletal: No acute osseous abnormality. IMPRESSION: 1. Interim placement of percutaneous cholecystostomy tube with decompression of the gallbladder. 2. Mixed findings of pancreatic mass and inflammatory change. Poorly defined infiltrative mass involving the pancreatic head and neck consistent with known pancreatic adenocarcinoma. Persistent stranding at the pancreatic bed consistent with pancreatic inflammatory process. Increased size of peripancreatic fluid collections compared to the most recent prior CT. 3. Distended stomach with wall thickening of the antrum and pylorus suspicious for inflammation. Possible small duodenal ulcer but no perforation. Wall thickening and inflammation of the duodenal C sweep. 4. Multiple hypodense liver lesions, reference prior MRI for further description and characterization. 5. Occluded appearing splenic and superior mesenteric veins. 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 Numerous collateral vessels throughout the mesentery. These findings are present on the prior exam 6. Aortic atherosclerosis. Aortic Atherosclerosis (ICD10-I70.0). Electronically Signed   By: Jasmine Pang M.D.   On: 12/23/2022 18:51     Scheduled Meds:  apixaban  5 mg Oral BID   Chlorhexidine Gluconate Cloth  6 each Topical Daily   gabapentin  900 mg Oral QHS   mirtazapine  45 mg Oral QHS   pantoprazole (PROTONIX) IV  40 mg Intravenous Q12H   QUEtiapine  100-200 mg Oral QHS   sodium chloride flush  3 mL Intravenous Q12H   sucralfate  1 g Oral TID WC & HS   Continuous  Infusions:  piperacillin-tazobactam (ZOSYN)  IV 3.375 g (12/25/22 0412)     LOS: 2 days    Time spent: 38 min  Alwyn Ren, MD  12/25/2022, 11:56 AM

## 2022-12-26 ENCOUNTER — Encounter (HOSPITAL_COMMUNITY): Payer: Self-pay | Admitting: Family Medicine

## 2022-12-26 DIAGNOSIS — E46 Unspecified protein-calorie malnutrition: Secondary | ICD-10-CM | POA: Diagnosis not present

## 2022-12-26 DIAGNOSIS — K311 Adult hypertrophic pyloric stenosis: Secondary | ICD-10-CM | POA: Diagnosis not present

## 2022-12-26 DIAGNOSIS — R1110 Vomiting, unspecified: Secondary | ICD-10-CM | POA: Diagnosis not present

## 2022-12-26 DIAGNOSIS — C250 Malignant neoplasm of head of pancreas: Secondary | ICD-10-CM

## 2022-12-26 DIAGNOSIS — Z7901 Long term (current) use of anticoagulants: Secondary | ICD-10-CM

## 2022-12-26 DIAGNOSIS — I81 Portal vein thrombosis: Secondary | ICD-10-CM

## 2022-12-26 DIAGNOSIS — Z939 Artificial opening status, unspecified: Secondary | ICD-10-CM

## 2022-12-26 DIAGNOSIS — K859 Acute pancreatitis without necrosis or infection, unspecified: Secondary | ICD-10-CM | POA: Diagnosis not present

## 2022-12-26 DIAGNOSIS — K861 Other chronic pancreatitis: Secondary | ICD-10-CM | POA: Diagnosis not present

## 2022-12-26 LAB — COMPREHENSIVE METABOLIC PANEL
ALT: 11 U/L (ref 0–44)
AST: 20 U/L (ref 15–41)
Albumin: 2.4 g/dL — ABNORMAL LOW (ref 3.5–5.0)
Alkaline Phosphatase: 441 U/L — ABNORMAL HIGH (ref 38–126)
Anion gap: 5 (ref 5–15)
BUN: 8 mg/dL (ref 8–23)
CO2: 28 mmol/L (ref 22–32)
Calcium: 8.3 mg/dL — ABNORMAL LOW (ref 8.9–10.3)
Chloride: 100 mmol/L (ref 98–111)
Creatinine, Ser: 0.41 mg/dL — ABNORMAL LOW (ref 0.44–1.00)
GFR, Estimated: >60 mL/min (ref >60)
Glucose, Bld: 108 mg/dL — ABNORMAL HIGH (ref 70–99)
Potassium: 3.9 mmol/L (ref 3.5–5.1)
Sodium: 133 mmol/L — ABNORMAL LOW (ref 135–145)
Total Bilirubin: 1 mg/dL (ref <1.2)
Total Protein: 4.9 g/dL — ABNORMAL LOW (ref 6.5–8.1)

## 2022-12-26 LAB — CBC
HCT: 31.7 % — ABNORMAL LOW (ref 36.0–46.0)
Hemoglobin: 10.5 g/dL — ABNORMAL LOW (ref 12.0–15.0)
MCH: 33.1 pg (ref 26.0–34.0)
MCHC: 33.1 g/dL (ref 30.0–36.0)
MCV: 100 fL (ref 80.0–100.0)
Platelets: 546 10*3/uL — ABNORMAL HIGH (ref 150–400)
RBC: 3.17 MIL/uL — ABNORMAL LOW (ref 3.87–5.11)
RDW: 14 % (ref 11.5–15.5)
WBC: 9.8 10*3/uL (ref 4.0–10.5)
nRBC: 0 % (ref 0.0–0.2)

## 2022-12-26 MED ORDER — VITAMIN D (ERGOCALCIFEROL) 1.25 MG (50000 UNIT) PO CAPS
50000.0000 [IU] | ORAL_CAPSULE | ORAL | Status: DC
  Administered 2022-12-26: 50000 [IU] via ORAL
  Filled 2022-12-26: qty 1

## 2022-12-26 MED ORDER — HEPARIN (PORCINE) 25000 UT/250ML-% IV SOLN
850.0000 [IU]/h | INTRAVENOUS | Status: DC
  Administered 2022-12-26: 750 [IU]/h via INTRAVENOUS
  Filled 2022-12-26: qty 250

## 2022-12-26 NOTE — Plan of Care (Signed)

## 2022-12-26 NOTE — Progress Notes (Signed)
PHARMACY - ANTICOAGULATION CONSULT NOTE  Pharmacy Consult for IV heparin Indication: splenic vein thrombus  No Known Allergies  Patient Measurements: Height: 5\' 3"  (160 cm) Weight: 48.1 kg (106 lb 0.7 oz) IBW/kg (Calculated) : 52.4 Heparin Dosing Weight:   Vital Signs: Temp: 97.8 F (36.6 C) (11/30 0820) Temp Source: Oral (11/30 0820) BP: 122/74 (11/30 0820) Pulse Rate: 91 (11/30 0820)  Labs: Recent Labs    12/24/22 0540 12/25/22 0500 12/26/22 0430  HGB 10.6* 10.1* 10.5*  HCT 32.2* 29.5* 31.7*  PLT 560* 501* 546*  CREATININE 0.46 0.50 0.41*    Estimated Creatinine Clearance: 54.7 mL/min (A) (by C-G formula based on SCr of 0.41 mg/dL (L)).   Medical History: Past Medical History:  Diagnosis Date   Alcohol abuse    Anxiety    Chickenpox    Depression    Neuromuscular disorder (HCC)    neuropathy   Pancreatic cancer (HCC)    Substance abuse (HCC)     Medications:  Medications Prior to Admission  Medication Sig Dispense Refill Last Dose   apixaban (ELIQUIS) 5 MG TABS tablet Take 2 tablets (10 mg total) by mouth 2 (two) times daily for 7 days, THEN 1 tablet (5 mg total) 2 (two) times daily for 21 days. 60 tablet 0 12/23/2022 at 0900   Ensure Max Protein (ENSURE MAX PROTEIN) LIQD Take 330 mLs (11 oz total) by mouth 2 (two) times daily.   12/24/2022   gabapentin (NEURONTIN) 300 MG capsule Take 2 capsules (600 mg total) by mouth 2 (two) times daily. (Patient taking differently: Take 900 mg by mouth at bedtime.) 60 capsule 0 12/22/2022   mirtazapine (REMERON) 45 MG tablet Take 45 mg by mouth at bedtime.   12/23/2022   oxyCODONE (OXY IR/ROXICODONE) 5 MG immediate release tablet Take 1 tablet (5 mg total) by mouth every 6 (six) hours as needed for up to 5 days for moderate pain (pain score 4-6) or severe pain (pain score 7-10). 20 tablet 0 12/23/2022   pantoprazole (PROTONIX) 40 MG tablet Take 1 tablet (40 mg total) by mouth 2 (two) times daily. 180 tablet 0 12/23/2022    QUEtiapine (SEROQUEL) 100 MG tablet Take 200 mg by mouth at bedtime.   12/23/2022   sucralfate (CARAFATE) 1 GM/10ML suspension Take 10 mLs (1 g total) by mouth 4 (four) times daily -  with meals and at bedtime. 420 mL 1 12/23/2022   albuterol (VENTOLIN HFA) 108 (90 Base) MCG/ACT inhaler Inhale 2 puffs into the lungs every 6 (six) hours as needed for wheezing or shortness of breath. (Patient not taking: Reported on 12/08/2022) 8.5 g 1 Not Taking   APIXABAN (ELIQUIS) VTE STARTER PACK (10MG  AND 5MG ) Take as directed on package: start with two-5mg  tablets twice daily for 7 days. On day 8, switch to one-5mg  tablet twice daily. 74 each 0    folic acid (FOLVITE) 1 MG tablet Take 1 tablet (1 mg total) by mouth daily. (Patient not taking: Reported on 12/08/2022) 30 tablet 1 Not Taking   sodium chloride flush (NS) 0.9 % SOLN Use 5 - 10 mLs to flush abdominal drain once daily as directed 300 mL 3    thiamine 100 MG tablet Take 1 tablet (100 mg total) by mouth daily. (Patient not taking: Reported on 09/12/2021) 30 tablet 1 Not Taking   topiramate (TOPAMAX) 50 MG tablet Take 50 mg by mouth 2 (two) times daily. (Patient not taking: Reported on 12/24/2022)   Not Taking    Assessment:  Pharmacy is consulted to start heparin drip in 62 yo female with PMH of splenic vein thombus. Pt has been on apixaban 5 mg PO BID, but medication is now being held as pt may undergo GI procedure on 12/2. LD Of apixaban was on 11/30 at 0938   Today, 12/26/22  Hgb 10.5, plt 546  SCr 0.41 mg/dl, CrCl 55 ml/min  Will monitor using aPTT as HL will be elevated due to apixaban use    Goal of Therapy:  Heparin level 0.3-0.7 units/ml Monitor platelets by anticoagulation protocol: Yes   Plan:  Starting at 2200, start heparin 750 units/ hr  Obtain aPTT 6 hours after start of infusion  Monitor CBC, HL, aPTT  Monitor for signs and symptoms of bleeding   Adalberto Cole, PharmD, BCPS 12/26/2022 1:53 PM

## 2022-12-26 NOTE — Progress Notes (Signed)
   12/26/22 0533  Assess: MEWS Score  Temp 98.2 F (36.8 C)  BP 96/72  MAP (mmHg) 82  Pulse Rate 93  ECG Heart Rate (!) 44  Resp 18  SpO2 95 %  O2 Device Room Air  Assess: MEWS Score  MEWS Temp 0  MEWS Systolic 1  MEWS Pulse 1  MEWS RR 0  MEWS LOC 0  MEWS Score 2  MEWS Score Color Yellow  Assess: if the MEWS score is Yellow or Red  Were vital signs accurate and taken at a resting state? Yes  Does the patient meet 2 or more of the SIRS criteria? No  MEWS guidelines implemented  Yes, yellow  Treat  MEWS Interventions Considered administering scheduled or prn medications/treatments as ordered  Take Vital Signs  Increase Vital Sign Frequency  Yellow: Q2hr x1, continue Q4hrs until patient remains green for 12hrs  Escalate  MEWS: Escalate Yellow: Discuss with charge nurse and consider notifying provider and/or RRT  Notify: Charge Nurse/RN  Name of Charge Nurse/RN Notified Meredith, RN  Provider Notification  Provider Name/Title Anthoney Harada, NP  Date Provider Notified 12/26/22  Time Provider Notified [phone]  Method of Notification Page  Notification Reason Other (Comment) (yellow MEWS)  Provider response No new orders  Date of Provider Response 12/26/22  Time of Provider Response 0538  Assess: SIRS CRITERIA  SIRS Temperature  0  SIRS Pulse 0  SIRS Respirations  0  SIRS WBC 0  SIRS Score Sum  0

## 2022-12-26 NOTE — Progress Notes (Signed)
Spoke with Corrie Dandy, pharmacist, regarding starting heparin drip without baseline PTT.  Corrie Dandy stated that they know it will be high, and a followup PTT will be drawn at 0400.  Anthoney Harada made aware.

## 2022-12-26 NOTE — Consult Note (Addendum)
Consultation  Referring Provider:     Dr. Mosetta Putt Primary Care Physician:  Allwardt, Crist Infante, PA-C Primary Gastroenterologist: Dr. 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  She has a picture concerning for recurrent problems.  Dr. Mosetta Putt has asked [place] to consider duodenal stenting.  I think that is a reasonable consideration, there are also pancreatic and peripancreatic fluid collections that are enlarging that may be causing duodenal compression.  Aspiration with those could potentially relieve the issue but based upon all of the available evidence I do think there is significant potential for recurrent problems which could interfere with delivery of chemotherapy.  She wants to wait until Monday and see if a procedure is possible.  Dr. Meridee Score returns and will be doing procedures on Monday I told her we cannot promise that he would be able to intervene.  In anticipation of that I am stopping her Eliquis and I believe she should start heparin.  Will ask [facility] to order.  During the discussion she expressed frustration and wondered about the potential for treatment success. She does have a complicated and advanced cancer that will be difficult to cure despite our best efforts, I think.  Quality of life is  unlikely to be good with or without treatment.  Consider goals of care discussion pending clinical course over the next few days.  [name], MD, [facility] See Loretha Stapler on call - gastroenterology for best contact person 12/26/2022 12:44 PM         HPI:   Kathryn Underwood is a 62 y.o. female admitted after an episode of intense epigastric pain and voluminous vomiting, in the setting of adenocarcinoma of the pancreas, peripancreatic fluid collections and duodenal stenosis/stricture related to those problems.  She is feeling better since admission, she had seen her primary care provider the day after that and due to leukocytosis and the history was sent to the hospital and was admitted 12/23/2022.  Since then she has tolerated a liquid diet on full liquids now.  She is having bowel movements.  No significant abdominal pain and no vomiting.  Dr. Mosetta Putt of oncology saw her and is concerned about her ability to eat well enough to maintain nutrition and to tolerate chemotherapy.  We were asked to consult because of this and to consider duodenal stent which apparently she has discussed with Dr. Meridee Score previously.  Originally admitted on 12/08/2022.  CT scanning raised the possibility of pancreatic cancer in the head with peripancreatic fluid collections, compression of the SMV and SMA and evidence of portal vein thrombosis.  MR abdomen MRCP with and without contrast 12/09/2022 IMPRESSION: Large locally aggressive heterogeneous soft tissue mass with spiculations identified centered of the neck and head of the pancreas. Associated involvement of the SMA and SMV with the occlusion of the SMV and narrowing of the SMA. 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 Worrisome for pancreatic neoplasm or  adenocarcinoma.   Relatively poor visualization of the rest of the pancreas with the areas of distortion poor enhancement. This could be the  sequela of neoplasm in the additional changes of pancreatitis. Several fluid collections are identified which could represent pseudo cysts with the largest extending along the lesser sac measuring up to 6 cm. Additional dominant collection seen caudal to the pancreatic head.   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 The gallbladder is also dilated with layering sludge and or stones.   Multiple cystic liver lesions identified. There is 1 enhancing lesion in the dome in segment 8 which is indeterminate. With the pancreatic findings this could be spread of disease but would has a differential recommend attention on short follow-up.   Geographic areas of fatty liver infiltration which can relate to the hepatic blood flow abnormalities.     Electronically Signed   By: Karen Kays M.D.   On: 12/09/2022 14:48    She had an EGD on 12/10/2022:  - LA Grade D erosive esophagitis with bleeding (found through the majority of the esophagus). Biopsied. - Benign-appearing esophageal stenosis noted distally. - Z-line irregular, 39 cm from the incisors. - 3 cm hiatal hernia. - A large amount of food (residue) in the stomach. - Retained food in the duodenum.   EUS and FNA on 12/12/2022 EGD findings as above with severe esophagitis a hiatal hernia and a large amount of food and fluid in the stomach with 1100 cc suctioning.  Congested duodenal mucosa leading to duodenal deformity that would not allow the linear echoendoscope or duodena scope into the second portion of the duodenum.  EUS findings showed a few cystic lesions in the pancreatic head genuine body some of which were aspirated, retroperitoneal peripancreatic fluid collections, mass in the genu cytology demonstrated adenocarcinoma.  Staging T4 N0 MX.  Some enlarged lymph nodes were seen but were  thought to be benign.  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 Meridee Score Pancreatic head cyst fluid CEA 640 ng/mL Amylase 3 units/L Glucose 9.2 mg/dL   Pancreatic neck cyst fluid CEA 47,020 ng/mL Amylase 410 units/L Glucose 28 mg/dL    CT abdomen pelvis with contrast 12/23/2022 (images reviewed) IMPRESSION: 1. Interim placement of percutaneous cholecystostomy tube with decompression of the gallbladder. 2. Mixed findings of pancreatic mass and inflammatory change. Poorly defined infiltrative mass involving the pancreatic head and neck consistent with known pancreatic adenocarcinoma. Persistent stranding at the pancreatic bed consistent with pancreatic inflammatory process. Increased size of peripancreatic fluid collections compared to the most recent prior CT. 3. Distended stomach with wall thickening of the antrum and pylorus suspicious for inflammation. Possible small duodenal ulcer but no perforation. Wall thickening and inflammation of the duodenal C sweep. 4. Multiple hypodense liver lesions, reference prior MRI for further description and characterization. 5. Occluded appearing splenic and superior mesenteric veins. 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 Numerous collateral vessels throughout the mesentery. These findings are present on the prior exam 6. Aortic atherosclerosis.   Aortic Atherosclerosis (ICD10-I70.0). Past Medical History:  Diagnosis Date   Alcohol abuse    Anxiety    Chickenpox    Depression    Neuromuscular disorder (HCC)    neuropathy   Pancreatic cancer (HCC)    Substance abuse (HCC)     Past Surgical History:  Procedure Laterality Date   BIOPSY  12/10/2022   Procedure: BIOPSY;  Surgeon: Lemar Lofty., MD;  Location: [facility];  Service: Gastroenterology;;   CESAREAN SECTION     ESOPHAGOGASTRODUODENOSCOPY N/A 08/14/2021   Procedure: ESOPHAGOGASTRODUODENOSCOPY (EGD);  Surgeon: Rachael Fee, MD;   Location: [facility];  Service: Gastroenterology;  Laterality: N/A;   ESOPHAGOGASTRODUODENOSCOPY N/A 12/12/2022   Procedure: ESOPHAGOGASTRODUODENOSCOPY (EGD);  Surgeon: Lemar Lofty., MD;  Location: [facility];  Service: Gastroenterology;  Laterality: N/A;   ESOPHAGOGASTRODUODENOSCOPY (EGD) WITH PROPOFOL N/A 12/10/2022   Procedure: ESOPHAGOGASTRODUODENOSCOPY (EGD) WITH PROPOFOL;  Surgeon: Meridee Score Netty Starring., MD;  Location: [facility];  Service: Gastroenterology;  Laterality: N/A;   EUS N/A 08/14/2021   Procedure: UPPER ENDOSCOPIC ULTRASOUND (EUS) RADIAL;  Surgeon: Rachael Fee, MD;  Location: [facility];  Service: Gastroenterology;  Laterality: N/A;   EUS N/A 12/12/2022   Procedure: UPPER ENDOSCOPIC ULTRASOUND (EUS) LINEAR;  Surgeon: Lemar Lofty., MD;  Location: [facility];  Service: Gastroenterology;  Laterality: N/A;   FINE NEEDLE ASPIRATION N/A 08/14/2021   Procedure: FINE NEEDLE ASPIRATION (FNA) LINEAR;  Surgeon: Rachael Fee, MD;  Location: [facility];  Service: Gastroenterology;  Laterality: N/A;   FINE NEEDLE ASPIRATION N/A 12/12/2022   Procedure: FINE NEEDLE ASPIRATION (FNA) LINEAR;  Surgeon: Lemar Lofty., MD;  Location: [facility];  Service: Gastroenterology;  Laterality: N/A;   IR IMAGING GUIDED PORT INSERTION  12/18/2022   IR PERC CHOLECYSTOSTOMY  12/13/2022   TONSILLECTOMY      Family History  Problem Relation Age of Onset   Dementia Mother    Arthritis Father    Asthma Father    Asthma Brother    Diabetes Maternal Grandmother    Dementia Maternal Grandmother    Diabetes Paternal Grandmother    Parkinson's disease Maternal Grandfather    Parkinson's disease Paternal Grandfather    Breast cancer Neg Hx    Colon cancer Neg Hx     Social History   Tobacco Use   Smoking status: Some Days    Current packs/day: 0.50    Types: Cigarettes   Smokeless tobacco: Never  Vaping Use   Vaping status: Never Used  Substance  Use Topics   Alcohol use: Not Currently    Alcohol/week: 16.0 standard drinks of alcohol    Types: 16 Glasses of wine per week    Comment: daily (1.5 liter bottle wine every 12)   Drug use: Not Currently    Types: Cocaine, Marijuana    Prior to Admission medications   Medication Sig Start Date End Date Taking? Authorizing Provider  apixaban (ELIQUIS) 5 MG TABS tablet Take 2 tablets (10 mg total) by mouth 2 (two) times daily for 7 days, THEN 1 tablet (5 mg total) 2 (two) times daily for 21 days. 12/18/22 01/15/23 Yes Almon Hercules, MD  Ensure Max Protein (ENSURE MAX PROTEIN) LIQD Take 330 mLs (11 oz total) by mouth 2 (two) times daily. 12/18/22  Yes Almon Hercules, MD  gabapentin (NEURONTIN) 300 MG capsule Take 2 capsules (600 mg total) by mouth 2 (two) times daily. Patient taking differently: Take 900 mg by mouth at bedtime. 10/09/17  Yes Rolan Bucco, MD  mirtazapine (REMERON) 45 MG tablet Take 45 mg by mouth at bedtime. 11/05/21  Yes [provider]  oxyCODONE (OXY IR/ROXICODONE) 5 MG immediate release tablet Take 1 tablet (5 mg total) by mouth every 6 (six) hours as needed for up to 5 days for moderate pain (pain score 4-6) or severe pain (pain score 7-10). 12/23/22 12/28/22 Yes Allwardt, Crist Infante, PA-C  pantoprazole (PROTONIX) 40 MG tablet Take 1 tablet (40 mg total) by mouth 2 (two) times daily. 12/18/22  Yes Almon Hercules, MD  QUEtiapine (SEROQUEL) 100 MG tablet Take 200 mg by mouth at bedtime.   Yes [provider]  sucralfate (CARAFATE) 1 GM/10ML suspension Take 10 mLs (1 g total) by mouth 4 (four) times daily -  with meals and at bedtime. 12/23/22  Yes Allwardt, Alyssa M, PA-C  albuterol (VENTOLIN HFA) 108 (90 Base) MCG/ACT inhaler Inhale 2 puffs into the lungs every 6 (six) hours as needed for wheezing or shortness of breath. Patient not taking: Reported on 12/08/2022 08/06/21   Burnadette Pop, MD  APIXABAN Everlene Balls) VTE STARTER PACK (10MG  AND 5MG ) Take as directed on  package: start with two-5mg  tablets twice daily for 7 days. On day 8, switch to one-5mg  tablet twice daily. 12/18/22   Almon Hercules, MD  folic acid (FOLVITE) 1 MG tablet Take 1 tablet (1 mg total) by mouth daily. Patient not taking: Reported on 12/08/2022 08/07/21   Burnadette Pop, MD  sodium chloride flush (NS) 0.9 % SOLN Use 5 - 10 mLs to flush abdominal drain once daily as directed 12/18/22   Allred, Darrell K, PA-C  thiamine 100 MG tablet Take 1 tablet (100 mg total) by mouth daily. Patient not taking: Reported on 09/12/2021 08/07/21   Burnadette Pop, MD  topiramate (TOPAMAX) 50 MG tablet Take 50 mg by mouth 2 (two) times daily. Patient not taking: Reported on 12/24/2022 04/29/22   [provider]    Current Facility-Administered Medications  Medication Dose Route Frequency Provider Last Rate Last Admin   acetaminophen (TYLENOL) tablet 650 mg  650 mg Oral Q6H PRN Opyd, Lavone Neri, MD       Or   acetaminophen (TYLENOL) suppository 650 mg  650 mg Rectal Q6H PRN Opyd, Lavone Neri, MD       albuterol (PROVENTIL) (2.5 MG/3ML) 0.083% nebulizer solution 3 mL  3 mL Inhalation Q6H PRN Opyd, Lavone Neri, MD       alum & mag hydroxide-simeth (MAALOX/MYLANTA) 200-200-20 MG/5ML suspension 30 mL  30 mL Oral Q4H PRN Opyd, Lavone Neri, MD   30 mL at 12/24/22 0117   Chlorhexidine Gluconate Cloth 2 % PADS 6 each  6 each Topical Daily Anthoney Harada, NP   6 each at 12/25/22 1111   gabapentin (NEURONTIN) capsule 900 mg  900 mg Oral QHS Briscoe Deutscher, MD   900 mg at 12/25/22 2115   HYDROmorphone (DILAUDID) injection 0.5 mg  0.5 mg Intravenous Q3H PRN Opyd, Lavone Neri, MD   0.5 mg at 12/26/22 1131   lactated ringers infusion   Intravenous Continuous Alwyn Ren, MD 75 mL/hr at 12/26/22 0224 New Bag at 12/26/22 0224   mirtazapine (REMERON) tablet 45 mg  45 mg Oral QHS Briscoe Deutscher, MD   45 mg at 12/25/22 2115   oxyCODONE (Oxy IR/ROXICODONE) immediate release tablet 5 mg  5 mg Oral Q4H PRN Opyd,  Lavone Neri, MD   5 mg at 12/26/22 0823   pantoprazole (PROTONIX) injection 40 mg  40 mg Intravenous Q12H Briscoe Deutscher, MD   40 mg at 12/26/22 0938   piperacillin-tazobactam (ZOSYN) IVPB 3.375 g  3.375 g Intravenous Q8H Alwyn Ren, MD 12.5 mL/hr at 12/26/22 1134 3.375 g at 12/26/22 1134   prochlorperazine (COMPAZINE) injection 5 mg  5 mg Intravenous Q6H PRN Opyd, Lavone Neri, MD       QUEtiapine (SEROQUEL) tablet 100-200  mg  100-200 mg Oral QHS Opyd, Lavone Neri, MD   100 mg at 12/25/22 2115   sodium chloride flush (NS) 0.9 % injection 3 mL  3 mL Intravenous Q12H Opyd, Lavone Neri, MD   3 mL at 12/26/22 0940   sucralfate (CARAFATE) 1 GM/10ML suspension 1 g  1 g Oral TID WC & HS Opyd, Lavone Neri, MD   1 g at 12/26/22 1131   Vitamin D (Ergocalciferol) (DRISDOL) 1.25 MG (50000 UNIT) capsule 50,000 Units  50,000 Units Oral Q7 days Alwyn Ren, MD   50,000 Units at 12/26/22 1133    Allergies as of 12/23/2022   (No Known Allergies)     Review of Systems:    This is positive for those things mentioned in the HPI. All other review of systems are negative.       Physical Exam:  Vital signs in last 24 hours: Temp:  [97.8 F (36.6 C)-98.2 F (36.8 C)] 97.8 F (36.6 C) (11/30 0820) Pulse Rate:  [86-93] 91 (11/30 0820) Resp:  [16-18] 18 (11/30 0533) BP: (96-122)/(68-81) 122/74 (11/30 0820) SpO2:  [94 %-97 %] 94 % (11/30 0820) Weight:  [48.1 kg] 48.1 kg (11/30 0500) Last BM Date : 12/25/22  General:  Thin, chronically ill white woman in no acute distress somewhat emaciated Eyes:  anicteric. Lungs: Clear to auscultation bilaterally. Heart:   S1S2, no rubs, murmurs, gallops. Abdomen:  soft, protuberant/moderately distended and tympanitic in the upper abdomen, non-tender, no hepatosplenomegaly, hernia, or mass and BS+.  There is a cholecystostomy tube in the right upper quadrant Neuro:  A&O x 3.  Psych:  appropriate mood and  Affect.   Data Reviewed:   LAB RESULTS: Recent  Labs    12/24/22 0540 12/25/22 0500 12/26/22 0430  WBC 12.6* 9.7 9.8  HGB 10.6* 10.1* 10.5*  HCT 32.2* 29.5* 31.7*  PLT 560* 501* 546*   BMET Recent Labs    12/24/22 0540 12/25/22 0500 12/26/22 0430  NA 130* 131* 133*  K 3.6 3.8 3.9  CL 95* 99 100  CO2 27 27 28   GLUCOSE 98 103* 108*  BUN 14 11 8   CREATININE 0.46 0.50 0.41*  CALCIUM 8.1* 8.1* 8.3*   LFT Recent Labs    12/26/22 0430  PROT 4.9*  ALBUMIN 2.4*  AST 20  ALT 11  ALKPHOS 441*  BILITOT 1.0       Thanks   LOS: 3 days   @Aireana Ryland  Sena Slate, MD, [facility] @  12/26/2022, 12:33 PM

## 2022-12-26 NOTE — Progress Notes (Signed)
PROGRESS NOTE    Kathryn Underwood  RKY:[id number] DOB: 1960-05-25 DOA: 12/23/2022 PCP: Bary Leriche, PA-C    Brief Narrative: 62 y.o. female with medical history significant for alcoholism, pancreatitis with pseudocyst, anxiety, depression, neuropathy, and recent diagnosis of pancreatic adenocarcinoma who presents for evaluation of elevated WBC on outpatient blood work.    Patient reports that she has had persistent epigastric pain and nausea since the recent hospital discharge.  She had eaten some grits yesterday, had severe epigastric pain following this that kept her up all night.  She then vomited a large volume this morning which relieved her pain.  She reports having a normal bowel movement this morning.  She denies any fevers or chills.  She has not noticed any change in the character or volume of her cholecystostomy tube drainage.    She saw a PA in the clinic today for hospital follow-up, had blood work performed, and received a call regarding elevated WBC.  She was advised to seek evaluation in the emergency department for this.   [facility] ED Course: Upon arrival to the ED, patient is found to be afebrile and saturating mid 90s on room air with mild tachycardia and SBP as low as 83.  Labs are most notable for WBC 17,400, platelets 679,000, alkaline phosphatase 515,000, normal lipase, and normal lactic acid.    CT- Interim placement of percutaneous cholecystostomy tube with decompression of the gallbladder.  Mixed findings of pancreatic mass and inflammatory change. Poorly defined infiltrative mass involving the pancreatic head and neck consistent with known pancreatic adenocarcinoma. Persistent stranding at the pancreatic bed consistent with pancreatic inflammatory process. Increased size of peripancreatic fluid collections compared to the most recent prior CT.  Distended stomach with wall thickening of the antrum and pylorus suspicious for inflammation. Possible  small duodenal ulcer but no perforation. Wall thickening and inflammation of the duodenal C sweep. Multiple hypodense liver lesions, reference prior MRI for further description and characterization. Occluded appearing splenic and superior mesenteric veins. 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 Numerous collateral vessels throughout the mesentery. These findings are present on the prior exam Patient was admitted to the hospital 12/08/2022 and discharged 12/18/2022, old records reviewed.  She underwent EGD and endoscopic ultrasound on 12/12/2022, pathology confirmed pancreatic adenocarcinoma .  Percutaneous cholecystostomy tube on 12/13/2022, water cath placed prior to discharge on 12/18/2022, she had shingles during that hospital stay Assessment & Plan:   Principal Problem:   Acute on chronic pancreatitis [facility]) Active Problems:   Thrombosis of splenic artery (HCC)   COPD (chronic obstructive pulmonary disease) (HCC)   Depression with anxiety   Acute esophagitis   Pancreatic adenocarcinoma (HCC)   SIRS (systemic inflammatory response syndrome) (HCC)   1. SIRS -patient was sent by her PCP to the ER due to findings of leukocytosis on outpatient labs.  At the time of admission her white count was 18 came down to 12.6 with IV fluids Vanco and Zosyn.  Patient was tachypneic tachycardic and hypotensive initially which improved with IV hydration.  Blood cultures were drawn and she was started on broad-spectrum antibiotics.  No source of infection identified. ?  Unclear source of infection-UA negative for UTI positive for ketones and proteins  2. Pancreatic adenocarcinoma  - Recent diagnosis, Port has been placed and ready to use  - Patient has appt with Dr. Mosetta Putt of oncology scheduled for 12/2 per her report  -Informed Dr. Mosetta Putt of patient's admission and appreciate her input GI consulted and appreciate input plan  for duodenal stenting most likely on Monday if schedule allows by Dr. Irish Lack already.    3. Esophagitis; ?duodenal ulcer -recent EGD and endoscopic ultrasound with erosive esophagitis and duodenal ulcer without perforation or obstruction.  Continue PPI and Carafate.   4. Splenic vein thrombosis  -Hold Eliquis in anticipation for the procedure on Monday -Will start heparin meanwhile   5. GB distension  - Cholecystostomy tube in place, continue drain care   She has an appointment with interventional radiology on January 29, 2023 at 930 at [facility]   6. Depression, anxiety  - Continue Remeron and Seroquel     7. COPD  - Not in exacerbation  - Albuterol as needed   Estimated body mass index is 18.78 kg/m as calculated from the following:   Height as of this encounter: 5\' 3"  (1.6 m).   Weight as of this encounter: 48.1 kg.  DVT prophylaxis:eliquis Code Status: full Family Communication:none Disposition Plan:  Status is: Inpatient Remains inpatient appropriate because: SIRS   Consultants: onc  Procedures:none Antimicrobials:  zosyn  Subjective:  Tolerating full liquid diet gi notes reviewed   objective: Vitals:   12/25/22 2154 12/26/22 0500 12/26/22 0533 12/26/22 0820  BP: 116/81  96/72 122/74  Pulse: 86  93 91  Resp: 18  18   Temp: 98.2 F (36.8 C)  98.2 F (36.8 C) 97.8 F (36.6 C)  TempSrc: Oral  Oral Oral  SpO2: 97%  95% 94%  Weight:  48.1 kg    Height:        Intake/Output Summary (Last 24 hours) at 12/26/2022 1104 Last data filed at 12/26/2022 0940 Gross per 24 hour  Intake 1410.21 ml  Output 300 ml  Net 1110.21 ml   Filed Weights   12/23/22 1542 12/24/22 0500 12/26/22 0500  Weight: 45.4 kg 47 kg 48.1 kg    Examination:  General exam: Appears in nad   Respiratory system: Clear to auscultation. Respiratory effort normal. Cardiovascular system: S1 & S2 heard, RRR. No JVD, murmurs, rubs, gallops or clicks. No pedal edema. Gastrointestinal system: Abdomen is nondistended, soft and nontender. No organomegaly or masses felt. Normal bowel  sounds heard.  Cholecystostomy tube in place Central nervous system: Alert and oriented. No focal neurological deficits. Extremities: No edema  Data Reviewed: I have personally reviewed following labs and imaging studies  CBC: Recent Labs  Lab 12/23/22 1243 12/23/22 1616 12/24/22 0540 12/25/22 0500 12/26/22 0430  WBC 18.3 Repeated and verified X2.* 17.4* 12.6* 9.7 9.8  NEUTROABS 15.8* 14.7*  --   --   --   HGB 12.8 12.0 10.6* 10.1* 10.5*  HCT 38.7 34.7* 32.2* 29.5* 31.7*  MCV 100.2* 94.6 100.3* 99.0 100.0  PLT 760.0* 679* 560* 501* 546*   Basic Metabolic Panel: Recent Labs  Lab 12/23/22 1243 12/23/22 1616 12/24/22 0540 12/25/22 0500 12/26/22 0430  NA 134* 135 130* 131* 133*  K 4.0 3.7 3.6 3.8 3.9  CL 95* 93* 95* 99 100  CO2 32 31 27 27 28   GLUCOSE 116* 108* 98 103* 108*  BUN 12 13 14 11 8   CREATININE 0.48 0.43* 0.46 0.50 0.41*  CALCIUM 9.4 9.2 8.1* 8.1* 8.3*  MG  --   --  2.0  --   --    GFR: Estimated Creatinine Clearance: 54.7 mL/min (A) (by C-G formula based on SCr of 0.41 mg/dL (L)). Liver Function Tests: Recent Labs  Lab 12/23/22 1243 12/23/22 1616 12/24/22 0540 12/25/22 0500 12/26/22 0430  AST 19 19  21 22 20   ALT 13 12 14 12 11   ALKPHOS 611* 515* 460* 421* 441*  BILITOT 0.8 0.9 1.1 1.0 1.0  PROT 5.9* 5.7* 5.2* 4.6* 4.9*  ALBUMIN 3.3* 3.1* 2.5* 2.3* 2.4*   Recent Labs  Lab 12/23/22 1616 12/25/22 0500  LIPASE 43 35  AMYLASE 36  --    No results for input(s): "AMMONIA" in the last 168 hours. Coagulation Profile: No results for input(s): "INR", "PROTIME" in the last 168 hours. Cardiac Enzymes: No results for input(s): "CKTOTAL", "CKMB", "CKMBINDEX", "TROPONINI" in the last 168 hours. BNP (last 3 results) No results for input(s): "PROBNP" in the last 8760 hours. HbA1C: No results for input(s): "HGBA1C" in the last 72 hours. CBG: No results for input(s): "GLUCAP" in the last 168 hours.  Lipid Profile: No results for input(s): "CHOL", "HDL",  "LDLCALC", "TRIG", "CHOLHDL", "LDLDIRECT" in the last 72 hours. Thyroid Function Tests: No results for input(s): "TSH", "T4TOTAL", "FREET4", "T3FREE", "THYROIDAB" in the last 72 hours. Anemia Panel: No results for input(s): "VITAMINB12", "FOLATE", "FERRITIN", "TIBC", "IRON", "RETICCTPCT" in the last 72 hours. Sepsis Labs: Recent Labs  Lab 12/23/22 1616  LATICACIDVEN 0.8    Recent Results (from the past 240 hour(s))  Culture, blood (routine x 2)     Status: None (Preliminary result)   Collection Time: 12/23/22  3:55 PM   Specimen: BLOOD  Result Value Ref Range Status   Specimen Description   Final    BLOOD Left Chest Port Performed at [facility], [address], [place], [place] [address]    Special Requests   Final    Blood Culture results may not be optimal due to an inadequate volume of blood received in culture bottles BOTTLES DRAWN AEROBIC AND ANAEROBIC Performed at [facility], [address], [place], [place] [address]    Culture   Final    NO GROWTH 3 DAYS Performed at [facility], [address] [place], [place] [address]    Report Status PENDING  Incomplete  Culture, blood (routine x 2)     Status: None (Preliminary result)   Collection Time: 12/24/22  8:45 AM   Specimen: BLOOD RIGHT ARM  Result Value Ref Range Status   Specimen Description   Final    BLOOD RIGHT ARM BOTTLES DRAWN AEROBIC AND ANAEROBIC Performed at [facility], [address] [place], [place] [address]    Special Requests   Final    Blood Culture adequate volume Performed at [facility], [address], [place], [place] [address]    Culture   Final    NO GROWTH 2 DAYS Performed at [facility], [address], [place], [place] [address]    Report Status PENDING  Incomplete  MRSA Next Gen by PCR, Nasal     Status: None   Collection Time: 12/25/22  1:43 PM   Specimen: Nasal Mucosa; Nasal Swab  Result Value Ref  Range Status   MRSA by PCR Next Gen NOT DETECTED NOT DETECTED Final    Comment: (NOTE) The GeneXpert MRSA Assay (FDA approved for NASAL specimens only), is one component of a comprehensive MRSA colonization surveillance program. It is not intended to diagnose MRSA infection nor to guide or monitor treatment for MRSA infections. Test performance is not FDA approved in patients less than 70 years old. Performed at [facility], [address], [place], [place] [address]  Radiology Studies: No results found.   Scheduled Meds:  apixaban  5 mg Oral BID   Chlorhexidine Gluconate Cloth  6 each Topical Daily   gabapentin  900 mg Oral QHS   mirtazapine  45 mg Oral QHS   pantoprazole (PROTONIX) IV  40 mg Intravenous Q12H   QUEtiapine  100-200 mg Oral QHS   sodium chloride flush  3 mL Intravenous Q12H   sucralfate  1 g Oral TID WC & HS   Vitamin D (Ergocalciferol)  50,000 Units Oral Q7 days   Continuous Infusions:  lactated ringers 75 mL/hr at 12/26/22 0224   piperacillin-tazobactam (ZOSYN)  IV 3.375 g (12/26/22 0420)     LOS: 3 days    Time spent: 38 min  Alwyn Ren, MD  12/26/2022, 11:04 AM

## 2022-12-27 DIAGNOSIS — C250 Malignant neoplasm of head of pancreas: Secondary | ICD-10-CM | POA: Diagnosis not present

## 2022-12-27 DIAGNOSIS — K311 Adult hypertrophic pyloric stenosis: Secondary | ICD-10-CM | POA: Diagnosis not present

## 2022-12-27 DIAGNOSIS — R1110 Vomiting, unspecified: Secondary | ICD-10-CM | POA: Diagnosis not present

## 2022-12-27 DIAGNOSIS — K861 Other chronic pancreatitis: Secondary | ICD-10-CM | POA: Diagnosis not present

## 2022-12-27 DIAGNOSIS — E46 Unspecified protein-calorie malnutrition: Secondary | ICD-10-CM | POA: Diagnosis not present

## 2022-12-27 DIAGNOSIS — K859 Acute pancreatitis without necrosis or infection, unspecified: Secondary | ICD-10-CM | POA: Diagnosis not present

## 2022-12-27 LAB — CBC
HCT: 31.8 % — ABNORMAL LOW (ref 36.0–46.0)
Hemoglobin: 10.8 g/dL — ABNORMAL LOW (ref 12.0–15.0)
MCH: 33.4 pg (ref 26.0–34.0)
MCHC: 34 g/dL (ref 30.0–36.0)
MCV: 98.5 fL (ref 80.0–100.0)
Platelets: 528 10*3/uL — ABNORMAL HIGH (ref 150–400)
RBC: 3.23 MIL/uL — ABNORMAL LOW (ref 3.87–5.11)
RDW: 14.1 % (ref 11.5–15.5)
WBC: 8.5 10*3/uL (ref 4.0–10.5)
nRBC: 0 % (ref 0.0–0.2)

## 2022-12-27 LAB — COMPREHENSIVE METABOLIC PANEL
ALT: 18 U/L (ref 0–44)
AST: 41 U/L (ref 15–41)
Albumin: 2.6 g/dL — ABNORMAL LOW (ref 3.5–5.0)
Alkaline Phosphatase: 691 U/L — ABNORMAL HIGH (ref 38–126)
Anion gap: 5 (ref 5–15)
BUN: 6 mg/dL — ABNORMAL LOW (ref 8–23)
CO2: 28 mmol/L (ref 22–32)
Calcium: 8.5 mg/dL — ABNORMAL LOW (ref 8.9–10.3)
Chloride: 98 mmol/L (ref 98–111)
Creatinine, Ser: 0.45 mg/dL (ref 0.44–1.00)
GFR, Estimated: >60 mL/min (ref >60)
Glucose, Bld: 110 mg/dL — ABNORMAL HIGH (ref 70–99)
Potassium: 3.8 mmol/L (ref 3.5–5.1)
Sodium: 131 mmol/L — ABNORMAL LOW (ref 135–145)
Total Bilirubin: 1.5 mg/dL — ABNORMAL HIGH (ref <1.2)
Total Protein: 5.4 g/dL — ABNORMAL LOW (ref 6.5–8.1)

## 2022-12-27 LAB — APTT
aPTT: 46 s — ABNORMAL HIGH (ref 24–36)
aPTT: 97 s — ABNORMAL HIGH (ref 24–36)

## 2022-12-27 MED ORDER — VITAMIN D (ERGOCALCIFEROL) 1.25 MG (50000 UNIT) PO CAPS: 50000.0000 [IU] | ORAL_CAPSULE | ORAL | 6 refills | Status: DC

## 2022-12-27 MED ORDER — HEPARIN SOD (PORK) LOCK FLUSH 100 UNIT/ML IV SOLN
500.0000 [IU] | Freq: Once | INTRAVENOUS | Status: AC
  Administered 2022-12-27: 500 [IU] via INTRAVENOUS
  Filled 2022-12-27: qty 5

## 2022-12-27 MED ORDER — PANTOPRAZOLE SODIUM 40 MG PO TBEC: 40.0000 mg | DELAYED_RELEASE_TABLET | Freq: Two times a day (BID) | ORAL | 0 refills | Status: DC

## 2022-12-27 MED ORDER — OXYCODONE HCL 5 MG PO TABS: 5.0000 mg | ORAL_TABLET | ORAL | 0 refills | Status: DC | PRN

## 2022-12-27 NOTE — Discharge Instructions (Signed)
Follow-up with interventional radiology on January 3 at [address]

## 2022-12-27 NOTE — Discharge Summary (Signed)
Physician Discharge Summary  Kathryn Underwood ZOX:[id number] DOB: Jun 21, 1960 DOA: 12/23/2022  PCP: Bary Leriche, PA-C  Admit date: 12/23/2022 Discharge date: 12/27/2022  Admitted From: Home Disposition: Home   Recommendations for Outpatient Follow-up:  Follow up with PCP in 1-2 weeks Please obtain BMP/CBC in one week Please follow up with Dr. Mosetta Putt and Dr. Meridee Score  Home Health: None Equipment/Devices: None  Discharge Condition: Stable CODE STATUS: Full code Diet recommendation: Full liquids and protein drinks Brief/Interim Summary: 63 y.o. female with medical history significant for alcoholism, pancreatitis with pseudocyst, anxiety, depression, neuropathy, and recent diagnosis of pancreatic adenocarcinoma who presents for evaluation of elevated WBC on outpatient blood work.  She was admitted with severe epigastric pain nausea and vomiting at the time of admission.  She threw up the grits that she ate.  She has had a cholecystostomy tube drainage placed during her prior hospital visit.  She denies diarrhea or constipation.   Discharge Diagnoses:  Principal Problem:   Acute on chronic pancreatitis [facility]) Active Problems:   Thrombosis of splenic artery (HCC)   COPD (chronic obstructive pulmonary disease) (HCC)   Depression with anxiety   Acute esophagitis   Partial gastric outlet obstruction   Pancreatic adenocarcinoma (HCC)   SIRS (systemic inflammatory response syndrome) (HCC)    1. SIRS -patient was sent by her PCP to the ER due to findings of leukocytosis on outpatient labs.  At the time of admission her white count was 18 came down to 12.6 with IV fluids Vanco and Zosyn.  Patient was tachypneic tachycardic and hypotensive initially which improved with IV hydration.  Blood cultures were drawn and she was started on broad-spectrum antibiotics.  No source of infection identified.  UA was negative.  However she received 3 days of Vanco and Zosyn.   2. Pancreatic adenocarcinoma   - Recent diagnosis, Port has been placed and ready to use  - Patient has appt with Dr. Mosetta Putt of oncology scheduled for 12/2 per her report.  Patient was seen by Dr. Parke Poisson and Dr. Leone Payor during this hospital stay.  Dr. Irish Lack already did not have an opening to put at ureteral stenting on 12/28/2022 therefore she was discharged on a full liquid diet and a high-protein drinks and to follow-up with GI and oncology as an outpatient.   3. Esophagitis; duodenal ulcer -recent EGD and endoscopic ultrasound with erosive esophagitis and duodenal ulcer without perforation or obstruction.  Continue PPI and Carafate.   4. Splenic vein thrombosis continue outpatient Eliquis   5. GB distension  - Cholecystostomy tube in place, continue drain care   She has an appointment with interventional radiology on January 29, 2023 at 930 at [facility]   6. Depression, anxiety  - Continue Remeron and Seroquel     7. COPD  - Not in exacerbation  Continue home meds  Estimated body mass index is 18.63 kg/m as calculated from the following:   Height as of this encounter: 5\' 3"  (1.6 m).   Weight as of this encounter: 47.7 kg.  Discharge Instructions  Discharge Instructions     Diet - low sodium heart healthy   Complete by: As directed    Increase activity slowly   Complete by: As directed    No wound care   Complete by: As directed       Allergies as of 12/27/2022   No Known Allergies      Medication List     STOP taking these medications    folic  acid 1 MG tablet Commonly known as: FOLVITE   thiamine 100 MG tablet Commonly known as: VITAMIN B1   topiramate 50 MG tablet Commonly known as: TOPAMAX       TAKE these medications    albuterol 108 (90 Base) MCG/ACT inhaler Commonly known as: VENTOLIN HFA Inhale 2 puffs into the lungs every 6 (six) hours as needed for wheezing or shortness of breath.   Apixaban Starter Pack (10mg  and 5mg ) Commonly known as: ELIQUIS STARTER PACK Take as  directed on package: start with two-5mg  tablets twice daily for 7 days. On day 8, switch to one-5mg  tablet twice daily.   Eliquis 5 MG Tabs tablet Generic drug: apixaban Take 2 tablets (10 mg total) by mouth 2 (two) times daily for 7 days, THEN 1 tablet (5 mg total) 2 (two) times daily for 21 days. Start taking on: December 18, 2022   Ensure Max Protein Liqd Take 330 mLs (11 oz total) by mouth 2 (two) times daily.   gabapentin 300 MG capsule Commonly known as: NEURONTIN Take 2 capsules (600 mg total) by mouth 2 (two) times daily. What changed:  how much to take when to take this   mirtazapine 45 MG tablet Commonly known as: REMERON Take 45 mg by mouth at bedtime.   oxyCODONE 5 MG immediate release tablet Commonly known as: Oxy IR/ROXICODONE Take 1 tablet (5 mg total) by mouth every 4 (four) hours as needed for moderate pain (pain score 4-6). What changed:  when to take this reasons to take this   pantoprazole 40 MG tablet Commonly known as: PROTONIX Take 1 tablet (40 mg total) by mouth 2 (two) times daily.   QUEtiapine 100 MG tablet Commonly known as: SEROQUEL Take 200 mg by mouth at bedtime.   sodium chloride flush 0.9 % Soln Commonly known as: NS Use 5 - 10 mLs to flush abdominal drain once daily as directed   sucralfate 1 GM/10ML suspension Commonly known as: Carafate Take 10 mLs (1 g total) by mouth 4 (four) times daily -  with meals and at bedtime.   Vitamin D (Ergocalciferol) 1.25 MG (50000 UNIT) Caps capsule Commonly known as: DRISDOL Take 1 capsule (50,000 Units total) by mouth every 7 (seven) days. Start taking on: January 02, 2023        No Known Allergies  Consultations: Oncology and GI   Procedures/Studies: CT ABDOMEN PELVIS W CONTRAST  Result Date: 12/23/2022 CLINICAL DATA:  Status post recent drain placement pancreas cancer continued pain EXAM: CT ABDOMEN AND PELVIS WITH CONTRAST TECHNIQUE: Multidetector CT imaging of the abdomen and pelvis  was performed using the standard protocol following bolus administration of intravenous contrast. RADIATION DOSE REDUCTION: This exam was performed according to the departmental dose-optimization program which includes automated exposure control, adjustment of the mA and/or kV according to patient size and/or use of iterative reconstruction technique. CONTRAST:  85mL OMNIPAQUE IOHEXOL 300 MG/ML  SOLN COMPARISON:  CT 12/12/2022, MRI 12/09/2022, CT 12/08/2022, 07/24/2021 FINDINGS: Lower chest: Lung bases demonstrate no acute airspace disease. Similar small volume pericardial effusion. Small hiatal hernia and mild distal esophageal thickening. Small distal esophageal varices. Hepatobiliary: Multiple hypodense liver lesions, reference prior MRI for further description and characterisation. Interim placement of percutaneous cholecystostomy tube with decompression of the gallbladder. Heterogeneous enhancement pattern of the liver which may be due to combination of steatosis and perfusion abnormality. Central intra hepatic ducts and extrahepatic biliary dilatation, common duct measures up to 15 mm. Abrupt ductal cut off on coronal views  due to pancreatic mass lesion. Pancreas: Poorly defined infiltrative mass involving the pancreatic head and neck, measures roughly 6 by 4.8 cm on series 2, image 28. Distal pancreatic atrophy with ductal dilatation. Soft tissue stranding at the pancreatic bed consistent with inflammation. Increased size of fluid collection between pancreas and stomach, now measuring 6.5 by 4.7 cm, previously 5.6 by 2.5 cm. Spleen: Normal in size without focal abnormality. Adrenals/Urinary Tract: Stable adrenal glands with diffuse thickening but no dominant mass. Kidneys show no hydronephrosis. The bladder is normal Stomach/Bowel: Moderate fluid distension of the stomach. Wall thickening of the pylorus and duodenal C sweep with inflammation. Focal wall thickening and area of fluid near duodenal bulb, coronal  series 5, image 62, potentially due to an area of ulceration. No free gas in the street gin. Remainder of the bowel shows no evidence for obstruction or wall thickening Vascular/Lymphatic: Advanced aortic atherosclerosis. No aneurysm. Narrowing of superior mesenteric artery by infiltrative pancreas mass. Occluded appearing splenic and superior mesenteric veins. 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 Numerous collateral vessels throughout the mesentery. Reproductive: Calcified uterine fibroids.  No adnexal mass Other: No free air. No large volume pelvic ascites. Increased organized fluid collection between the head of pancreas and the second portion of duodenum, this now measures 4 x 2.6 cm, previously 2.4 x 2.2 cm. Musculoskeletal: No acute osseous abnormality. IMPRESSION: 1. Interim placement of percutaneous cholecystostomy tube with decompression of the gallbladder. 2. Mixed findings of pancreatic mass and inflammatory change. Poorly defined infiltrative mass involving the pancreatic head and neck consistent with known pancreatic adenocarcinoma. Persistent stranding at the pancreatic bed consistent with pancreatic inflammatory process. Increased size of peripancreatic fluid collections compared to the most recent prior CT. 3. Distended stomach with wall thickening of the antrum and pylorus suspicious for inflammation. Possible small duodenal ulcer but no perforation. Wall thickening and inflammation of the duodenal C sweep. 4. Multiple hypodense liver lesions, reference prior MRI for further description and characterization. 5. Occluded appearing splenic and superior mesenteric veins. 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 Numerous collateral vessels throughout the mesentery. These findings are present on the prior exam 6. Aortic atherosclerosis. Aortic Atherosclerosis (ICD10-I70.0). Electronically Signed   By: Jasmine Pang M.D.   On: 12/23/2022 18:51   IR IMAGING GUIDED PORT INSERTION  Result Date:  12/18/2022 INDICATION: 62 year old with pancreatic cancer. Port-A-Cath needed for treatment. EXAM: FLUOROSCOPIC AND ULTRASOUND GUIDED PLACEMENT OF A SUBCUTANEOUS PORT. Physician: Rachelle Hora. Henn, MD MEDICATIONS: Moderate sedation ANESTHESIA/SEDATION: Moderate (conscious) sedation was employed during this procedure. A total of Versed 1mg  and fentanyl 50 mcg was administered intravenously at the order of the provider performing the procedure. Total intra-service moderate sedation time: 57 minutes. Patient's level of consciousness and vital signs were monitored continuously by radiology nurse throughout the procedure under the supervision of the provider performing the procedure. FLUOROSCOPY: Radiation Exposure Index (as provided by the fluoroscopic device): 3 mGy Kerma COMPLICATIONS: None immediate. PROCEDURE: The risks of the procedure were explained to the patient. Informed consent was obtained. Patient was placed supine on the interventional table. Ultrasound confirmed a patent left internal jugular vein. The left chest and neck were cleaned with a skin antiseptic and a sterile drape was placed. Maximal barrier sterile technique was utilized including caps, mask, sterile gowns, sterile gloves, sterile drape, hand hygiene and skin antiseptic. The left neck was anesthetized with 1% lidocaine. Small incision was made in the left neck with a blade. Micropuncture set was placed in the left IJ with ultrasound guidance. The micropuncture  wire was used for measurement purposes. The left chest was anesthetized with 1% lidocaine with epinephrine. #15 blade was used to make an incision and a subcutaneous port pocket was formed. 8 french Power Port was assembled. Subcutaneous tunnel was formed with a stiff tunneling device. The port catheter was brought through the subcutaneous tunnel. The port was placed in the subcutaneous pocket. The micropuncture set was exchanged for a peel-away sheath. The catheter was placed through the  peel-away sheath but the tip was too short in the upper SVC. Attempted to place a longer catheter over a wire but this was unsuccessful. As a result, the access was completely removed. Left jugular vein was punctured again using ultrasound guidance and micropuncture dilator set was placed. A new port catheter was tunneled between the subcutaneous pocket and the vein site. A new peel-away sheath was placed and the port catheter was advanced into the central venous system. Catheter tip was placed at the superior cavoatrial junction. The catheter was cut and attached to the port. Port was placed within the subcutaneous pocket. Catheter placement was confirmed with fluoroscopy. The port was accessed and flushed with saline. The port pocket was closed using two layers of absorbable sutures and Dermabond. The vein skin site was closed using a single layer of absorbable suture and Dermabond. Sterile dressings were applied. Patient tolerated the procedure well without an immediate complication. Ultrasound and fluoroscopic images were taken and saved for this procedure. IMPRESSION: Placement of a subcutaneous port device. Catheter tip at the superior cavoatrial junction and ready to be used. Electronically Signed   By: Richarda Overlie M.D.   On: 12/18/2022 14:03   CT CHEST WO CONTRAST  Result Date: 12/17/2022 CLINICAL DATA:  Staging for recently diagnosed pancreatic cancer. EXAM: CT CHEST WITHOUT CONTRAST TECHNIQUE: Multidetector CT imaging of the chest was performed following the standard protocol without IV contrast. RADIATION DOSE REDUCTION: This exam was performed according to the departmental dose-optimization program which includes automated exposure control, adjustment of the mA and/or kV according to patient size and/or use of iterative reconstruction technique. COMPARISON:  Chest, abdomen and pelvis CT dated 12/08/2022 and abdomen and pelvis CT dated 12/12/2022. Abdomen MRI dated 12/09/2022. Abdomen and pelvis CT  dated 07/27/2021. Chest CT dated 07/24/2021. FINDINGS: Cardiovascular: Atheromatous calcifications, including the coronary arteries and aorta. Normal sized heart. Small pericardial effusion with an interval decrease in size with a current maximum thickness of 1.1 cm. Mediastinum/Nodes: No enlarged mediastinal or axillary lymph nodes. Thyroid gland and trachea are unremarkable. Previously demonstrated diffuse low-density wall thickening involving the distal esophagus. Lungs/Pleura: Stable small area of ground-glass opacity in the medial left upper lobe. This remains less prominent than seen initially on 07/24/2021 suggesting an infectious or inflammatory process. Mild bilateral linear atelectasis and scarring. No solid lung nodules or pleural fluid. Upper Abdomen: The previously described large pancreatic head mass is partially included and unchanged. Again demonstrated is somewhat patchy, confluent low density in the liver on the right felt to represent a flow anomaly on previous studies. The previously demonstrated portal vein thrombus is not visible without intravenous contrast today. Multiple small liver cysts are unchanged. Musculoskeletal: Lower cervical spine degenerative changes and minimal thoracic spine degenerative changes. No evidence of bony metastatic disease. IMPRESSION: 1. No evidence of metastatic disease in the chest. 2. Stable small area of ground-glass opacity in the medial left upper lobe, likely an infectious or inflammatory process. 3. Small pericardial effusion with an interval decrease in size. 4. Previously demonstrated diffuse low-density  wall thickening involving the distal esophagus. This most likely due to reflux esophagitis. 5. Calcific coronary artery and aortic atherosclerosis. 6. Stable recently described pancreatic head mass and probable perfusion abnormality in the liver. Without the previous studies for comparison, metastatic disease/direct spread of tumor in the liver could have  a similar appearance. Aortic Atherosclerosis (ICD10-I70.0). Electronically Signed   By: Beckie Salts M.D.   On: 12/17/2022 11:43   IR Perc Cholecystostomy  Addendum Date: 12/13/2022   ADDENDUM REPORT: 12/13/2022 20:18 ADDENDUM: Contrast: 5 mL Omnipaque 300 Electronically Signed   By: Richarda Overlie M.D.   On: 12/13/2022 20:18   Result Date: 12/13/2022 INDICATION: 62 year old with abdominal pain and severe gallbladder distension. New fluid around the gallbladder after upper endoscopy and EUS. Concern for a pancreatic malignancy. EXAM: IMAGE GUIDED PERCUTANEOUS CHOLECYSTOSTOMY TUBE PLACEMENT MEDICATIONS: Inpatient and receiving IV antibiotics. ANESTHESIA/SEDATION: Moderate (conscious) sedation was employed during this procedure. A total of Versed 1.5 mg and Fentanyl 50 mcg was administered intravenously by the radiology nurse. Total intra-service moderate Sedation Time: 15 minutes. The patient's level of consciousness and vital signs were monitored continuously by radiology nursing throughout the procedure under my direct supervision. FLUOROSCOPY: Radiation Exposure Index (as provided by the fluoroscopic device): 0.1 mGy Kerma COMPLICATIONS: None immediate. PROCEDURE: Informed written consent was obtained from the patient after a thorough discussion of the procedural risks, benefits and alternatives. All questions were addressed. A timeout was performed prior to the initiation of the procedure. Patient was placed supine. The right side of the abdomen was prepped and draped in sterile fashion. Maximal barrier sterile technique was utilized including caps, mask, sterile gowns, sterile gloves, sterile drape, hand hygiene and skin antiseptic. Ultrasound demonstrated a very distended gallbladder. The skin was anesthetized using 1% lidocaine. Using ultrasound guidance, 21 gauge needle was directed into the gallbladder and a 0.018 wire was placed. A transitional dilator set was placed. Wire was advanced into the  gallbladder and the tract was dilated to accommodate a 10 [place] multipurpose drain. Approximately 200 mL of dark green fluid was removed. The gallbladder was decompressed at the end of the procedure. Contrast injection confirmed placement in the gallbladder. Drain was sutured to skin and attached to a gravity bag. Fluid was sent for culture and cytology. Fluoroscopic and ultrasound images were taken and saved for documentation. FINDINGS: Gallbladder was severely distended. Fluid and edematous tissue around the gallbladder. Gallbladder was decompressed at the end of the procedure. 200 mL of fluid was removed. IMPRESSION: Successful image guided placement of a percutaneous cholecystostomy tube. Electronically Signed: By: Richarda Overlie M.D. On: 12/13/2022 19:47   CT ABDOMEN PELVIS W CONTRAST  Result Date: 12/12/2022 CLINICAL DATA:  Acute abdominal pain,  pancreatitis, pancreatic pass EXAM: CT ABDOMEN AND PELVIS WITH CONTRAST TECHNIQUE: Multidetector CT imaging of the abdomen and pelvis was performed using the standard protocol following bolus administration of intravenous contrast. RADIATION DOSE REDUCTION: This exam was performed according to the departmental dose-optimization program which includes automated exposure control, adjustment of the mA and/or kV according to patient size and/or use of iterative reconstruction technique. CONTRAST:  OMNIPAQUE IOHEXOL 300 MG/ML  SOLN COMPARISON:  12/09/2022, 12/08/2022 FINDINGS: Lower chest: No acute pleural or parenchymal lung disease. Stable pericardial effusion. Stable distal esophageal wall thickening. Small hiatal hernia. Hepatobiliary: Marked distension of the gallbladder is again noted, with intrahepatic duct dilation and proximal common bile duct distension, due to occlusion by the infiltrative pancreatic mass image previously. Heterogeneous appearance of the liver parenchyma within the  inferior right lobe and extending along the gallbladder fossa could  likely due to known portal vein thrombosis or geographic hepatic steatosis as seen on recent MRI. Multiple liver hypodensities are unchanged, previously characterized on MRI. Pancreas: The infiltrative mass centered at the pancreatic head is again noted, measuring up to 6.1 x 4.4 cm, compatible with pancreatic neoplasm based on MRI findings. Upstream pancreatic parenchymal atrophy. Decreased fluid collections surrounding the pancreas, now measuring 5.6 x 2.5 cm image 36/2 and 2.4 x 2.2 cm reference image 44/2. Spleen: Normal in size without focal abnormality. Adrenals/Urinary Tract: Stable appearance of the adrenals and kidneys. Bladder is decompressed, limiting its evaluation. Stomach/Bowel: Prominent gastric distension again noted, with functional gastric outlet obstruction due to mass effect by the infiltrative pancreatic mass described above. Likely ulceration within the gastric antrum measuring up to 1.8 cm reference image 26/5. No evidence of perforation. No evidence of bowel obstruction or ileus. Vascular/Lymphatic: Continued occlusion of the splenic vein, SMV, and proximal aspect of the portal vein again noted due to extrinsic compression by the infiltrative pancreatic mass. There is nonocclusive thrombus within the main portal vein near the bifurcation. Extensive venous collaterals are seen throughout the mesentery unchanged since prior exam. Stable aortic atherosclerosis. No discrete pathologic adenopathy identified. Numerous subcentimeter mesenteric lymph nodes adjacent to the pancreatic mass in the upper abdomen are nonspecific. Reproductive: Calcified uterine fibroids unchanged. No adnexal masses. Other: Free fluid surrounds the distended gallbladder within the right upper quadrant. No free intraperitoneal gas. No abdominal wall hernia. Musculoskeletal: No acute or destructive bony abnormalities. Reconstructed images demonstrate no additional findings. IMPRESSION: 1. Stable infiltrating pancreatic mass,  with thrombosis of the splenic vein, SMV, and proximal portal vein. Nonocclusive thrombus within the main portal vein just proximal to the bifurcation. 2. Decreased size of the peripancreatic fluid collections as above. New simple appearing free fluid surrounding a distended gallbladder and right upper quadrant could reflect interval rupture of these peripancreatic fluid collections. 3. Continued gastric distension compatible with functional gastric outlet obstruction due to the infiltrating pancreatic mass. Suspected ulcer within the gastric antrum, without evidence of perforation. 4. Marked gallbladder distension, with intrahepatic and extrahepatic biliary duct dilation, due to downstream common bile duct obstruction due to the pancreatic mass as seen on recent MRI. No evidence of gallbladder wall thickening or calcified gallstones. 5.  Aortic Atherosclerosis (ICD10-I70.0). Electronically Signed   By: Sharlet Salina M.D.   On: 12/12/2022 17:56   MR ABDOMEN MRCP W WO CONTAST  Result Date: 12/09/2022 CLINICAL DATA:  Pancreatitis with a pancreatic mass. EXAM: MRI ABDOMEN WITHOUT AND WITH CONTRAST (INCLUDING MRCP) TECHNIQUE: Multiplanar multisequence MR imaging of the abdomen was performed both before and after the administration of intravenous contrast. Heavily T2-weighted images of the biliary and pancreatic ducts were obtained, and three-dimensional MRCP images were rendered by post processing. CONTRAST:  5mL GADAVIST GADOBUTROL 1 MMOL/ML IV SOLN COMPARISON:  CT scans including 12/08/2022, 07/27/2021, 07/24/2021 for comparison. MRCP 08/03/2021. FINDINGS: Lower chest: Slight linear signal at the lung bases. Likely scar or atelectasis. Moderate pericardial effusion again identified. Hepatobiliary: There are geographic areas of signal drop on out of phase imaging in the liver particularly involving segments 5, 6 and 4 consistent with a geographic areas of fatty infiltration. There is mosaic appearance to  enhancement of the liver in the arterial phase as well. As seen on prior CT there is thrombus within the main portal vein with several upper abdominal collateral vessels. Separate multiple hepatic cysts identified which are predominantly under  a cm in size. On series 27, image 11 in the dome of the right hepatic lobe segment 8 is a small enhancing nodule measuring 10 mm. This has mildly bright on T2 precontrast. This has an indeterminate lesion. There is moderate intrahepatic biliary ductal dilatation diffusely. The common hepatic duct has a diameter of 15 mm proximally with focal high-grade stricture over a long segment of the common duct extending down to the pancreatic head and ampulla. Gallbladder is dilated with layering sludge and possible stones. Pancreas: Dysmorphic appearance to the pancreas. They are smaller areas of residual pancreatic normal enhancement in the body and head/uncinate region. Centered along the neck and head region is a spiculated heterogeneous mass as seen on CT scan showing restricted diffusion. This has mass somewhat difficult to measure with the surrounding extensive other abnormality but is estimated in dimension on series 27, image 50 4.9 x 5.5 cm. This extends caudal to the pancreas into the central mesentery and is associated with occlusion of the splenic vein. There is encasement of the splenic artery. Extensive central mesenteric collaterals identified. There are several areas which has poor enhancement along the tail as well which could be the sequela of additional history of pancreatitis. The fluid collection extending anterior to the pancreas along the lesser sac on today's examination measures 6.0 by 5.2 cm. There are several other smaller collections identified adjacent to this larger. There is also a significant fluid collection in the central mesentery just caudal to the head of the pancreas on series 26, image 25 measuring 5.0 by 3.1 cm. In principle, these has a  differential but easily could be pseudocysts. No associated nodular wall enhancement. Spleen:  Within normal limits in size and appearance. Adrenals/Urinary Tract: No masses identified. No evidence of hydronephrosis. Stomach/Bowel: Debris noted in the nondilated stomach. The visualized small and large bowel are nondilated. Scattered colonic stool. Vascular/Lymphatic: Atherosclerotic changes identified. Normal caliber aorta and IVC. 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 The aforementioned mass lesion does appear to encase the SMA with some narrowing. Other:  Mild ascites. Musculoskeletal: Mild degenerative changes along the spine. IMPRESSION: Large locally aggressive heterogeneous soft tissue mass with spiculations identified centered of the neck and head of the pancreas. Associated involvement of the SMA and SMV with the occlusion of the SMV and narrowing of the SMA. 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 Worrisome for pancreatic neoplasm or adenocarcinoma. Relatively poor visualization of the rest of the pancreas with the areas of distortion poor enhancement. This could be the sequela of neoplasm in the additional changes of pancreatitis. Several fluid collections are identified which could represent pseudo cysts with the largest extending along the lesser sac measuring up to 6 cm. Additional dominant collection seen caudal to the pancreatic head. 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 The gallbladder is also dilated with layering sludge and or stones. Multiple cystic liver lesions identified. There is 1 enhancing lesion in the dome in segment 8 which is indeterminate. With the pancreatic findings this could be spread  of disease but would has a differential recommend attention on short follow-up. Geographic areas of fatty liver infiltration which can relate to the hepatic blood flow abnormalities. Electronically Signed   By: Karen Kays M.D.   On: 12/09/2022 14:48  MR 3D Recon At Scanner  Result Date: 12/09/2022 CLINICAL DATA:  Pancreatitis with a pancreatic mass. EXAM: MRI ABDOMEN WITHOUT AND WITH CONTRAST (INCLUDING MRCP) TECHNIQUE: Multiplanar multisequence MR imaging of the abdomen was performed both before and after the administration of intravenous contrast. Heavily T2-weighted images of the biliary and pancreatic ducts were obtained, and three-dimensional MRCP images were rendered by post processing. CONTRAST:  5mL GADAVIST GADOBUTROL 1 MMOL/ML IV SOLN COMPARISON:  CT scans including 12/08/2022, 07/27/2021, 07/24/2021 for comparison. MRCP 08/03/2021. FINDINGS: Lower chest: Slight linear signal at the lung bases. Likely scar or atelectasis. Moderate pericardial effusion again identified. Hepatobiliary: There are geographic areas of signal drop on out of phase imaging in the liver particularly involving segments 5, 6 and 4 consistent with a geographic areas of fatty infiltration. There is mosaic appearance to enhancement of the liver in the arterial phase as well. As seen on prior CT there is thrombus within the main portal vein with several upper abdominal collateral vessels. Separate multiple hepatic cysts identified which are predominantly under a cm in size. On series 27, image 11 in the dome of the right hepatic lobe segment 8 is a small enhancing nodule measuring 10 mm. This has mildly bright on T2 precontrast. This has an indeterminate lesion. There is moderate intrahepatic biliary ductal dilatation diffusely. The common hepatic duct has a diameter of 15 mm proximally with focal high-grade stricture over a long segment of the common duct extending down to the pancreatic head and ampulla. Gallbladder is dilated  with layering sludge and possible stones. Pancreas: Dysmorphic appearance to the pancreas. They are smaller areas of residual pancreatic normal enhancement in the body and head/uncinate region. Centered along the neck and head region is a spiculated heterogeneous mass as seen on CT scan showing restricted diffusion. This has mass somewhat difficult to measure with the surrounding extensive other abnormality but is estimated in dimension on series 27, image 50 4.9 x 5.5 cm. This extends caudal to the pancreas into the central mesentery and is associated with occlusion of the splenic vein. There is encasement of the splenic artery. Extensive central mesenteric collaterals identified. There are several areas which has poor enhancement along the tail as well which could be the sequela of additional history of pancreatitis. The fluid collection extending anterior to the pancreas along the lesser sac on today's examination measures 6.0 by 5.2 cm. There are several other smaller collections identified adjacent to this larger. There is also a significant fluid collection in the central mesentery just caudal to the head of the pancreas on series 26, image 25 measuring 5.0 by 3.1 cm. In principle, these has a differential but easily could be pseudocysts. No associated nodular wall enhancement. Spleen:  Within normal limits in size and appearance. Adrenals/Urinary Tract: No masses identified. No evidence of hydronephrosis. Stomach/Bowel: Debris noted in the nondilated stomach. The visualized small and large bowel are nondilated. Scattered colonic stool. Vascular/Lymphatic: Atherosclerotic changes identified. Normal caliber aorta and IVC. 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 The aforementioned mass lesion does appear to encase the SMA with some narrowing. Other:  Mild ascites. Musculoskeletal: Mild degenerative changes  along the spine. IMPRESSION: Large locally aggressive heterogeneous soft tissue mass with spiculations identified centered of the neck and head of the pancreas. Associated involvement of the SMA and SMV with the occlusion of the SMV and narrowing of the SMA. Extensive collateral vessels  identified including evidence of portal vein thrombus. Worrisome for pancreatic neoplasm or adenocarcinoma. Relatively poor visualization of the rest of the pancreas with the areas of distortion poor enhancement. This could be the sequela of neoplasm in the additional changes of pancreatitis. Several fluid collections are identified which could represent pseudo cysts with the largest extending along the lesser sac measuring up to 6 cm. Additional dominant collection seen caudal to the pancreatic head. 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 The gallbladder is also dilated with layering sludge and or stones. Multiple cystic liver lesions identified. There is 1 enhancing lesion in the dome in segment 8 which is indeterminate. With the pancreatic findings this could be spread of disease but would has a differential recommend attention on short follow-up. Geographic areas of fatty liver infiltration which can relate to the hepatic blood flow abnormalities. Electronically Signed   By: Karen Kays M.D.   On: 12/09/2022 14:48   CT CHEST ABDOMEN PELVIS W CONTRAST  Result Date: 12/08/2022 CLINICAL DATA:  Dysphagia, decreasing appetite and weight loss * Tracking Code: BO * EXAM: CT CHEST, ABDOMEN, AND PELVIS WITH CONTRAST TECHNIQUE: Multidetector CT imaging of the chest, abdomen and pelvis was performed following the standard protocol during bolus administration of intravenous contrast. RADIATION DOSE REDUCTION: This exam was performed according to the departmental dose-optimization program  which includes automated exposure control, adjustment of the mA and/or kV according to patient size and/or use of iterative reconstruction technique. CONTRAST:  75mL OMNIPAQUE IOHEXOL 300 MG/ML  SOLN COMPARISON:  CT chest, 07/24/2021, abdomen pelvis, 07/27/2021 FINDINGS: CT CHEST FINDINGS Cardiovascular: Scattered aortic atherosclerosis. Normal heart size. Left coronary artery calcifications. Small pericardial effusion. Mediastinum/Nodes: No enlarged mediastinal, hilar, or axillary lymph nodes. Thyroid gland, trachea, and esophagus demonstrate no significant findings. Lungs/Pleura: Diminished ground-glass opacity of the medial left apex measuring 0.7 cm, previously 1.0 cm, nonspecific and infectious or inflammatory requiring no specific further follow-up (series 4, image 37). Mild centrilobular emphysema. Benign, bandlike scarring of the left lung base. No pleural effusion or pneumothorax. Musculoskeletal: No chest wall abnormality. No acute osseous findings. CT ABDOMEN PELVIS FINDINGS Hepatobiliary: Heterogeneous perfusion of the liver. Severe intra hepatic biliary ductal dilatation with a distended gallbladder. 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 Partial cavernous transformation. Pancreas: Very extensive, expansile soft tissue, fat stranding, and cystic change throughout the pancreatic head, encasing multiple adjacent structures including the portal vein and central mesenteric vessels, the superior mesenteric artery, and portions of the descending duodenum (series 2, image 66). Maximum apparent cross-section of the pancreatic head is 6.4 x 5.3 cm (series 2, image 71). Severe atrophy of the distal pancreatic parenchyma as well as diffuse ductal dilatation distally. Multiple fluid collections, including a collection overlying the pancreatic neck  and body measuring 6.9 x 5.3 cm (series 2, image 69), and extending inferiorly from the lateral pancreatic head measuring 4.0 x 3.5 cm (series 2, image 77). Spleen: Normal in size without significant abnormality. Adrenals/Urinary Tract: Adenomatous thickening of the adrenal glands, requiring no specific further follow-up or characterization. Kidneys are normal, without renal calculi, solid lesion, or hydronephrosis. Bladder is unremarkable. Stomach/Bowel: Stomach is within normal limits. Appendix appears normal. No evidence of bowel wall thickening, distention, or inflammatory changes. Vascular/Lymphatic:  Aortic atherosclerosis. No enlarged abdominal or pelvic lymph nodes. Reproductive: Calcified uterine fibroids. Other: No abdominal wall hernia or abnormality. No ascites. Musculoskeletal: No acute osseous findings. IMPRESSION: 1. Very extensive, expansile soft tissue, fat stranding, and cystic change throughout the pancreatic head, encasing multiple adjacent structures including the portal vein and central mesenteric vessels, the superior mesenteric artery, and portions of the descending duodenum. Maximum apparent cross-section of the pancreatic head is 6.4 x 5.3 cm. 2. Severe intrahepatic biliary ductal dilatation with a distended gallbladder. 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 3. Multiple peripancreatic fluid collections, including a collection overlying the pancreatic neck and body measuring 6.9 x 5.3 cm, and extending inferiorly from the lateral pancreatic head measuring 4.0 x 3.5 cm, of uncertain acuity, consistent with acute pancreatic fluid collections or pseudocysts. The presence or absence of infection within this fluid is not established by imaging. 4. Constellation of findings is of uncertain significance,  highly concerning for pancreatic adenocarcinoma complicated by pancreatitis, however at least conceivably this could reflect benign, although unusually severe acute on chronic pancreatitis. 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 5. No evidence of lymphadenopathy or metastatic disease in the chest, abdomen, or pelvis. 6. Emphysema. 7. Coronary artery disease. Aortic Atherosclerosis (ICD10-I70.0) and Emphysema (ICD10-J43.9). Electronically Signed   By: Jearld Lesch M.D.   On: 12/08/2022 14:56   (Echo, Carotid, EGD, Colonoscopy, ERCP)    Subjective: Patient is resting in bed she is tolerating full liquid diet  Discharge Exam: Vitals:   12/26/22 2340 12/27/22 0422  BP: 117/70 124/75  Pulse: 84 87  Resp: 14 14  Temp: 98.3 F (36.8 C) 98 F (36.7 C)  SpO2: 97% 96%   Vitals:   12/26/22 1603 12/26/22 1935 12/26/22 2340 12/27/22 0422  BP: 117/74 125/76 117/70 124/75  Pulse: 84 87 84 87  Resp: 18 14 14 14   Temp: 98.5 F (36.9 C) 98.9 F (37.2 C) 98.3 F (36.8 C) 98 F (36.7 C)  TempSrc: Oral Oral Oral Oral  SpO2: 96% 95% 97% 96%  Weight:    47.7 kg  Height:        General: Pt is alert, awake, not in acute distress Cardiovascular: RRR, S1/S2 +, no rubs, no gallops Respiratory: CTA bilaterally, no wheezing, no rhonchi Abdominal: Soft, NT, ND, bowel sounds + Extremities: no edema, no cyanosis    The results of significant diagnostics from this hospitalization (including imaging, microbiology, ancillary and laboratory) are listed below for reference.     Microbiology: Recent Results (from the past 240 hour(s))  Culture, blood (routine x 2)     Status: None (Preliminary result)   Collection Time: 12/23/22  3:55 PM   Specimen: BLOOD  Result Value Ref Range Status   Specimen Description   Final    BLOOD Left  Chest Port Performed at [facility], [address], [place], [place] [address]    Special Requests   Final    Blood Culture results may not be optimal due to an inadequate volume of blood received in culture bottles BOTTLES DRAWN AEROBIC AND ANAEROBIC Performed at [facility], [address] [place], [place] [address]    Culture   Final  NO GROWTH 4 DAYS Performed at [facility], [address], [place], [place] [address]    Report Status PENDING  Incomplete  Culture, blood (routine x 2)     Status: None (Preliminary result)   Collection Time: 12/24/22  8:45 AM   Specimen: BLOOD RIGHT ARM  Result Value Ref Range Status   Specimen Description   Final    BLOOD RIGHT ARM BOTTLES DRAWN AEROBIC AND ANAEROBIC Performed at [facility], [address] [place], [place] [address]    Special Requests   Final    Blood Culture adequate volume Performed at [facility], [address] [place], [place] [address]    Culture   Final    NO GROWTH 3 DAYS Performed at [facility], [address] [place], [place] [address]    Report Status PENDING  Incomplete  MRSA Next Gen by PCR, Nasal     Status: None   Collection Time: 12/25/22  1:43 PM   Specimen: Nasal Mucosa; Nasal Swab  Result Value Ref Range Status   MRSA by PCR Next Gen NOT DETECTED NOT DETECTED Final    Comment: (NOTE) The GeneXpert MRSA Assay (FDA approved for NASAL specimens only), is one component of a comprehensive MRSA colonization surveillance program. It is not intended to diagnose MRSA infection nor to guide or monitor treatment for MRSA infections. Test performance is not FDA approved in patients less than 56 years old. Performed at [facility], [address], [place], [place] [address]      Labs: BNP (last 3 results) No results for input(s): "BNP" in the last 8760 hours. Basic Metabolic Panel: Recent Labs  Lab  12/23/22 1616 12/24/22 0540 12/25/22 0500 12/26/22 0430 12/27/22 0420  NA 135 130* 131* 133* 131*  K 3.7 3.6 3.8 3.9 3.8  CL 93* 95* 99 100 98  CO2 31 27 27 28 28   GLUCOSE 108* 98 103* 108* 110*  BUN 13 14 11 8  6*  CREATININE 0.43* 0.46 0.50 0.41* 0.45  CALCIUM 9.2 8.1* 8.1* 8.3* 8.5*  MG  --  2.0  --   --   --    Liver Function Tests: Recent Labs  Lab 12/23/22 1616 12/24/22 0540 12/25/22 0500 12/26/22 0430 12/27/22 0420  AST 19 21 22 20  41  ALT 12 14 12 11 18   ALKPHOS 515* 460* 421* 441* 691*  BILITOT 0.9 1.1 1.0 1.0 1.5*  PROT 5.7* 5.2* 4.6* 4.9* 5.4*  ALBUMIN 3.1* 2.5* 2.3* 2.4* 2.6*   Recent Labs  Lab 12/23/22 1616 12/25/22 0500  LIPASE 43 35  AMYLASE 36  --    No results for input(s): "AMMONIA" in the last 168 hours. CBC: Recent Labs  Lab 12/23/22 1243 12/23/22 1616 12/24/22 0540 12/25/22 0500 12/26/22 0430 12/27/22 0420  WBC 18.3 Repeated and verified X2.* 17.4* 12.6* 9.7 9.8 8.5  NEUTROABS 15.8* 14.7*  --   --   --   --   HGB 12.8 12.0 10.6* 10.1* 10.5* 10.8*  HCT 38.7 34.7* 32.2* 29.5* 31.7* 31.8*  MCV 100.2* 94.6 100.3* 99.0 100.0 98.5  PLT 760.0* 679* 560* 501* 546* 528*   Cardiac Enzymes: No results for input(s): "CKTOTAL", "CKMB", "CKMBINDEX", "TROPONINI" in the last 168 hours. BNP: Invalid input(s): "POCBNP" CBG: No results for input(s): "GLUCAP" in the last 168 hours. D-Dimer No results for input(s): "DDIMER" in the last 72 hours. Hgb A1c No results for input(s): "HGBA1C" in the last 72  hours. Lipid Profile No results for input(s): "CHOL", "HDL", "LDLCALC", "TRIG", "CHOLHDL", "LDLDIRECT" in the last 72 hours. Thyroid function studies No results for input(s): "TSH", "T4TOTAL", "T3FREE", "THYROIDAB" in the last 72 hours.  Invalid input(s): "FREET3" Anemia work up No results for input(s): "VITAMINB12", "FOLATE", "FERRITIN", "TIBC", "IRON", "RETICCTPCT" in the last 72 hours. Urinalysis    Component Value Date/Time   COLORURINE  YELLOW 12/23/2022 1616   APPEARANCEUR CLEAR 12/23/2022 1616   LABSPEC >1.046 (H) 12/23/2022 1616   PHURINE 8.0 12/23/2022 1616   GLUCOSEU NEGATIVE 12/23/2022 1616   HGBUR NEGATIVE 12/23/2022 1616   BILIRUBINUR NEGATIVE 12/23/2022 1616   KETONESUR 15 (A) 12/23/2022 1616   PROTEINUR TRACE (A) 12/23/2022 1616   UROBILINOGEN 0.2 01/23/2010 1750   NITRITE NEGATIVE 12/23/2022 1616   LEUKOCYTESUR NEGATIVE 12/23/2022 1616   Sepsis Labs Recent Labs  Lab 12/24/22 0540 12/25/22 0500 12/26/22 0430 12/27/22 0420  WBC 12.6* 9.7 9.8 8.5   Microbiology Recent Results (from the past 240 hour(s))  Culture, blood (routine x 2)     Status: None (Preliminary result)   Collection Time: 12/23/22  3:55 PM   Specimen: BLOOD  Result Value Ref Range Status   Specimen Description   Final    BLOOD Left Chest Port Performed at [facility], [address] [place], [place] [address]    Special Requests   Final    Blood Culture results may not be optimal due to an inadequate volume of blood received in culture bottles BOTTLES DRAWN AEROBIC AND ANAEROBIC Performed at [facility], [address], [place], [place] [address]    Culture   Final    NO GROWTH 4 DAYS Performed at [facility], [address], [place], [place] [address]    Report Status PENDING  Incomplete  Culture, blood (routine x 2)     Status: None (Preliminary result)   Collection Time: 12/24/22  8:45 AM   Specimen: BLOOD RIGHT ARM  Result Value Ref Range Status   Specimen Description   Final    BLOOD RIGHT ARM BOTTLES DRAWN AEROBIC AND ANAEROBIC Performed at [facility], [address] [place], [place] [address]    Special Requests   Final    Blood Culture adequate volume Performed at [facility], [address], [place], [place] [address]    Culture   Final    NO GROWTH 3 DAYS Performed at [facility], [address], [place], [place] [address]     Report Status PENDING  Incomplete  MRSA Next Gen by PCR, Nasal     Status: None   Collection Time: 12/25/22  1:43 PM   Specimen: Nasal Mucosa; Nasal Swab  Result Value Ref Range Status   MRSA by PCR Next Gen NOT DETECTED NOT DETECTED Final    Comment: (NOTE) The GeneXpert MRSA Assay (FDA approved for NASAL specimens only), is one component of a comprehensive MRSA colonization surveillance program. It is not intended to diagnose MRSA infection nor to guide or monitor treatment for MRSA infections. Test performance is not FDA approved in patients less than 77 years old. Performed at [facility], [address], [place], [place] [address]      Time coordinating discharge: 38 minutes  SIGNED  Alwyn Ren, MD  [facility] 12/27/2022, 1:23 PM

## 2022-12-27 NOTE — Progress Notes (Signed)
PHARMACY - ANTICOAGULATION CONSULT NOTE  Pharmacy Consult for IV heparin Indication: splenic vein thrombus  No Known Allergies  Patient Measurements: Height: 5\' 3"  (160 cm) Weight: 47.7 kg (105 lb 2.6 oz) IBW/kg (Calculated) : 52.4 Heparin Dosing Weight:   Vital Signs: Temp: 98 F (36.7 C) (12/01 0422) Temp Source: Oral (12/01 0422) BP: 124/75 (12/01 0422) Pulse Rate: 87 (12/01 0422)  Labs: Recent Labs    12/25/22 0500 12/26/22 0430 12/27/22 0420 12/27/22 1211  HGB 10.1* 10.5* 10.8*  --   HCT 29.5* 31.7* 31.8*  --   PLT 501* 546* 528*  --   APTT  --   --  46* 97*  CREATININE 0.50 0.41* 0.45  --     Estimated Creatinine Clearance: 54.2 mL/min (by C-G formula based on SCr of 0.45 mg/dL).   Medical History: Past Medical History:  Diagnosis Date   Alcohol abuse    Anxiety    Chickenpox    Depression    Neuromuscular disorder (HCC)    neuropathy   Pancreatic cancer (HCC)    Substance abuse (HCC)     Medications:  Medications Prior to Admission  Medication Sig Dispense Refill Last Dose   apixaban (ELIQUIS) 5 MG TABS tablet Take 2 tablets (10 mg total) by mouth 2 (two) times daily for 7 days, THEN 1 tablet (5 mg total) 2 (two) times daily for 21 days. 60 tablet 0 12/23/2022 at 0900   Ensure Max Protein (ENSURE MAX PROTEIN) LIQD Take 330 mLs (11 oz total) by mouth 2 (two) times daily.   12/24/2022   gabapentin (NEURONTIN) 300 MG capsule Take 2 capsules (600 mg total) by mouth 2 (two) times daily. (Patient taking differently: Take 900 mg by mouth at bedtime.) 60 capsule 0 12/22/2022   mirtazapine (REMERON) 45 MG tablet Take 45 mg by mouth at bedtime.   12/23/2022   oxyCODONE (OXY IR/ROXICODONE) 5 MG immediate release tablet Take 1 tablet (5 mg total) by mouth every 6 (six) hours as needed for up to 5 days for moderate pain (pain score 4-6) or severe pain (pain score 7-10). 20 tablet 0 12/23/2022   QUEtiapine (SEROQUEL) 100 MG tablet Take 200 mg by mouth at bedtime.    12/23/2022   sucralfate (CARAFATE) 1 GM/10ML suspension Take 10 mLs (1 g total) by mouth 4 (four) times daily -  with meals and at bedtime. 420 mL 1 12/23/2022   [DISCONTINUED] pantoprazole (PROTONIX) 40 MG tablet Take 1 tablet (40 mg total) by mouth 2 (two) times daily. 180 tablet 0 12/23/2022   albuterol (VENTOLIN HFA) 108 (90 Base) MCG/ACT inhaler Inhale 2 puffs into the lungs every 6 (six) hours as needed for wheezing or shortness of breath. (Patient not taking: Reported on 12/08/2022) 8.5 g 1 Not Taking   APIXABAN (ELIQUIS) VTE STARTER PACK (10MG  AND 5MG ) Take as directed on package: start with two-5mg  tablets twice daily for 7 days. On day 8, switch to one-5mg  tablet twice daily. 74 each 0    folic acid (FOLVITE) 1 MG tablet Take 1 tablet (1 mg total) by mouth daily. (Patient not taking: Reported on 12/08/2022) 30 tablet 1 Not Taking   sodium chloride flush (NS) 0.9 % SOLN Use 5 - 10 mLs to flush abdominal drain once daily as directed 300 mL 3    thiamine 100 MG tablet Take 1 tablet (100 mg total) by mouth daily. (Patient not taking: Reported on 09/12/2021) 30 tablet 1 Not Taking   topiramate (TOPAMAX) 50 MG tablet  Take 50 mg by mouth 2 (two) times daily. (Patient not taking: Reported on 12/24/2022)   Not Taking    Assessment: Pharmacy is consulted to start heparin drip in 62 yo female with PMH of splenic vein thombus. Pt has been on apixaban 5 mg PO BID, but medication is now being held as pt may undergo GI procedure on 12/2. LD Of apixaban was on 11/30 at 0938   Today, 12/27/22  aPTT 97 therapeutic on 850 units/hr Hgb 10.8, plt 528 No bleeding noted Will monitor using aPTT as HL will be elevated due to apixaban use    Goal of Therapy:  Heparin level 0.3-0.7 units/ml Monitor platelets by anticoagulation protocol: Yes   Plan:  Continue  heparin drip at  850 units/hr Monitor CBC, HL, aPTT  Monitor for signs and symptoms of bleeding   Adalberto Cole, PharmD, BCPS 12/27/2022 1:25  PM

## 2022-12-27 NOTE — Plan of Care (Signed)

## 2022-12-27 NOTE — Progress Notes (Signed)
   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 Meridee Score would be able to perform a procedure tomorrow and potentially place a duodenal stent if he felt appropriate plus or minus drainage of cysts if needed.  He is not able to do that, I have learned and we do not have other expertise available to help with this.  The patient is tolerating full liquids and I think should be discharged back on her Eliquis and I will communicate with Dr. Mosetta Putt regarding this situation.  Discussed with Dr. Ashley Royalty via secure chat and she will update the patient as when I saw her earlier I did not have this information.  [name], MD, [facility] See Loretha Stapler on call - gastroenterology for best contact person 12/27/2022 11:59 AM     Subjective  Tolerating full liquid still   Objective  BP 124/75 (BP Location: Right Arm)   Pulse 87   Temp 98 F (36.7 C) (Oral)   Resp 14   Ht 5\' 3"  (1.6 m)   Wt 47.7 kg   SpO2 96%   BMI 18.63 kg/m  Thin, frail chronically ill white woman in no acute distress  Recent Labs  Lab 12/23/22 1616 12/24/22 0540 12/25/22 0500 12/26/22 0430 12/27/22 0420  AST 19 21 22 20  41  ALT 12 14 12 11 18   ALKPHOS 515* 460* 421* 441* 691*  BILITOT 0.9 1.1 1.0 1.0 1.5*  PROT 5.7* 5.2* 4.6* 4.9* 5.4*  ALBUMIN 3.1* 2.5* 2.3* 2.4* 2.6*   Recent Labs  Lab 12/23/22 1616 12/24/22 0540  12/25/22 0500 12/26/22 0430 12/27/22 0420  NA 135 130* 131* 133* 131*  K 3.7 3.6 3.8 3.9 3.8  CL 93* 95* 99 100 98  CO2 31 27 27 28 28   GLUCOSE 108* 98 103* 108* 110*  BUN 13 14 11 8  6*  CREATININE 0.43* 0.46 0.50 0.41* 0.45  CALCIUM 9.2 8.1* 8.1* 8.3* 8.5*  MG  --  2.0  --   --   --    Recent Labs  Lab 12/25/22 0500 12/26/22 0430 12/27/22 0420  HGB 10.1* 10.5* 10.8*  HCT 29.5* 31.7* 31.8*  WBC 9.7 9.8 8.5  PLT 501* 546* 528*     [name], MD, [facility] See AMION on call - gastroenterology for best contact person 12/27/2022 11:57 AM

## 2022-12-27 NOTE — Progress Notes (Signed)
Pt discharged to home. DC instructions given. No concerns voiced. Pt left unit stable and in wheelchair in the care of significant other. Reminded to stop and pick up meds, that were e-prescribed by MD, from pharmacy. Verbalized understanding.

## 2022-12-27 NOTE — Progress Notes (Signed)
PHARMACY - ANTICOAGULATION CONSULT NOTE  Pharmacy Consult for IV heparin Indication: splenic vein thrombus  No Known Allergies  Patient Measurements: Height: 5\' 3"  (160 cm) Weight: 47.7 kg (105 lb 2.6 oz) IBW/kg (Calculated) : 52.4 Heparin Dosing Weight:   Vital Signs: Temp: 98 F (36.7 C) (12/01 0422) Temp Source: Oral (12/01 0422) BP: 124/75 (12/01 0422) Pulse Rate: 87 (12/01 0422)  Labs: Recent Labs    12/24/22 0540 12/25/22 0500 12/26/22 0430 12/27/22 0420  HGB 10.6* 10.1* 10.5* 10.8*  HCT 32.2* 29.5* 31.7* 31.8*  PLT 560* 501* 546* 528*  APTT  --   --   --  46*  CREATININE 0.46 0.50 0.41*  --     Estimated Creatinine Clearance: 54.2 mL/min (A) (by C-G formula based on SCr of 0.41 mg/dL (L)).   Medical History: Past Medical History:  Diagnosis Date   Alcohol abuse    Anxiety    Chickenpox    Depression    Neuromuscular disorder (HCC)    neuropathy   Pancreatic cancer (HCC)    Substance abuse (HCC)     Medications:  Medications Prior to Admission  Medication Sig Dispense Refill Last Dose   apixaban (ELIQUIS) 5 MG TABS tablet Take 2 tablets (10 mg total) by mouth 2 (two) times daily for 7 days, THEN 1 tablet (5 mg total) 2 (two) times daily for 21 days. 60 tablet 0 12/23/2022 at 0900   Ensure Max Protein (ENSURE MAX PROTEIN) LIQD Take 330 mLs (11 oz total) by mouth 2 (two) times daily.   12/24/2022   gabapentin (NEURONTIN) 300 MG capsule Take 2 capsules (600 mg total) by mouth 2 (two) times daily. (Patient taking differently: Take 900 mg by mouth at bedtime.) 60 capsule 0 12/22/2022   mirtazapine (REMERON) 45 MG tablet Take 45 mg by mouth at bedtime.   12/23/2022   oxyCODONE (OXY IR/ROXICODONE) 5 MG immediate release tablet Take 1 tablet (5 mg total) by mouth every 6 (six) hours as needed for up to 5 days for moderate pain (pain score 4-6) or severe pain (pain score 7-10). 20 tablet 0 12/23/2022   pantoprazole (PROTONIX) 40 MG tablet Take 1 tablet (40 mg  total) by mouth 2 (two) times daily. 180 tablet 0 12/23/2022   QUEtiapine (SEROQUEL) 100 MG tablet Take 200 mg by mouth at bedtime.   12/23/2022   sucralfate (CARAFATE) 1 GM/10ML suspension Take 10 mLs (1 g total) by mouth 4 (four) times daily -  with meals and at bedtime. 420 mL 1 12/23/2022   albuterol (VENTOLIN HFA) 108 (90 Base) MCG/ACT inhaler Inhale 2 puffs into the lungs every 6 (six) hours as needed for wheezing or shortness of breath. (Patient not taking: Reported on 12/08/2022) 8.5 g 1 Not Taking   APIXABAN (ELIQUIS) VTE STARTER PACK (10MG  AND 5MG ) Take as directed on package: start with two-5mg  tablets twice daily for 7 days. On day 8, switch to one-5mg  tablet twice daily. 74 each 0    folic acid (FOLVITE) 1 MG tablet Take 1 tablet (1 mg total) by mouth daily. (Patient not taking: Reported on 12/08/2022) 30 tablet 1 Not Taking   sodium chloride flush (NS) 0.9 % SOLN Use 5 - 10 mLs to flush abdominal drain once daily as directed 300 mL 3    thiamine 100 MG tablet Take 1 tablet (100 mg total) by mouth daily. (Patient not taking: Reported on 09/12/2021) 30 tablet 1 Not Taking   topiramate (TOPAMAX) 50 MG tablet Take 50 mg  by mouth 2 (two) times daily. (Patient not taking: Reported on 12/24/2022)   Not Taking    Assessment: Pharmacy is consulted to start heparin drip in 62 yo female with PMH of splenic vein thombus. Pt has been on apixaban 5 mg PO BID, but medication is now being held as pt may undergo GI procedure on 12/2. LD Of apixaban was on 11/30 at 0938   Today, 12/27/22  aPTT 46 subtherapeutic on 750 units/hr Hgb 10.8, plt 528 No bleeding noted Will monitor using aPTT as HL will be elevated due to apixaban use    Goal of Therapy:  Heparin level 0.3-0.7 units/ml Monitor platelets by anticoagulation protocol: Yes   Plan:  Increase heparin drip to 850 units/hr aPTT in 6 hours Monitor CBC, HL, aPTT  Monitor for signs and symptoms of bleeding   Arley Phenix RPh 12/27/2022,  5:08 AM

## 2022-12-27 NOTE — TOC Transition Note (Signed)
Transition of Care [facility]) - CM/SW Discharge Note   Patient Details  Name: PAYTYN PUCK MRN: [id number] Date of Birth: 1960-03-24  [facility]) CM/SW Contact:  Darleene Cleaver, LCSW Phone Number: 12/27/2022, 3:35 PM   Clinical Narrative:     Patient discharging home, no TOC needs.   Final next level of care: Home/Self Care Barriers to Discharge: Barriers Resolved   Patient Goals and CMS Choice CMS Medicare.gov Compare Post Acute Care list provided to::  (n/a)    Discharge Placement                         Discharge Plan and Services Additional resources added to the After Visit Summary for   In-house Referral: NA Discharge Planning Services: CM Consult Post Acute Care Choice: NA          DME Arranged: N/A DME Agency: NA       HH Arranged: NA HH Agency: NA        Social Determinants of Health (SDOH) Interventions SDOH Screenings   Food Insecurity: No Food Insecurity (12/23/2022)  Housing: Low Risk  (12/23/2022)  Transportation Needs: No Transportation Needs (12/23/2022)  Utilities: Not At Risk (12/23/2022)  Tobacco Use: High Risk (12/24/2022)     Readmission Risk Interventions    12/25/2022   10:56 AM  Readmission Risk Prevention Plan  Transportation Screening Complete  PCP or Specialist Appt within 3-5 Days Complete  HRI or Home Care Consult Complete  Social Work Consult for Recovery Care Planning/Counseling Complete  Palliative Care Screening Not Applicable  Medication Review [employer]) Referral to Pharmacy

## 2022-12-28 ENCOUNTER — Inpatient Hospital Stay: Payer: Medicaid Other

## 2022-12-28 ENCOUNTER — Other Ambulatory Visit (HOSPITAL_COMMUNITY): Payer: Self-pay

## 2022-12-28 ENCOUNTER — Encounter: Payer: Self-pay | Admitting: Hematology

## 2022-12-28 ENCOUNTER — Inpatient Hospital Stay: Payer: Medicaid Other | Attending: Hematology | Admitting: Hematology

## 2022-12-28 ENCOUNTER — Telehealth: Payer: Self-pay | Admitting: *Deleted

## 2022-12-28 ENCOUNTER — Telehealth: Payer: Self-pay | Admitting: Hematology

## 2022-12-28 VITALS — BP 100/66 | HR 120 | Temp 98.0°F | Resp 19 | Wt 100.1 lb

## 2022-12-28 DIAGNOSIS — C258 Malignant neoplasm of overlapping sites of pancreas: Secondary | ICD-10-CM | POA: Insufficient documentation

## 2022-12-28 DIAGNOSIS — C259 Malignant neoplasm of pancreas, unspecified: Secondary | ICD-10-CM

## 2022-12-28 DIAGNOSIS — E860 Dehydration: Secondary | ICD-10-CM | POA: Diagnosis not present

## 2022-12-28 DIAGNOSIS — Z5111 Encounter for antineoplastic chemotherapy: Secondary | ICD-10-CM | POA: Insufficient documentation

## 2022-12-28 LAB — CMP (CANCER CENTER ONLY)
ALT: 16 U/L (ref 0–44)
AST: 23 U/L (ref 15–41)
Albumin: 3.7 g/dL (ref 3.5–5.0)
Alkaline Phosphatase: 758 U/L — ABNORMAL HIGH (ref 38–126)
Anion gap: 8 (ref 5–15)
BUN: 8 mg/dL (ref 8–23)
CO2: 36 mmol/L — ABNORMAL HIGH (ref 22–32)
Calcium: 10.3 mg/dL (ref 8.9–10.3)
Chloride: 92 mmol/L — ABNORMAL LOW (ref 98–111)
Creatinine: 0.48 mg/dL (ref 0.44–1.00)
GFR, Estimated: >60 mL/min (ref >60)
Glucose, Bld: 113 mg/dL — ABNORMAL HIGH (ref 70–99)
Potassium: 4.3 mmol/L (ref 3.5–5.1)
Sodium: 136 mmol/L (ref 135–145)
Total Bilirubin: 1.3 mg/dL — ABNORMAL HIGH (ref <1.2)
Total Protein: 6.7 g/dL (ref 6.5–8.1)

## 2022-12-28 LAB — CBC WITH DIFFERENTIAL (CANCER CENTER ONLY)
Abs Immature Granulocytes: 0.04 10*3/uL (ref 0.00–0.07)
Basophils Absolute: 0.1 10*3/uL (ref 0.0–0.1)
Basophils Relative: 1 %
Eosinophils Absolute: 0.3 10*3/uL (ref 0.0–0.5)
Eosinophils Relative: 3 %
HCT: 39 % (ref 36.0–46.0)
Hemoglobin: 13.6 g/dL (ref 12.0–15.0)
Immature Granulocytes: 0 %
Lymphocytes Relative: 16 %
Lymphs Abs: 1.8 10*3/uL (ref 0.7–4.0)
MCH: 33.6 pg (ref 26.0–34.0)
MCHC: 34.9 g/dL (ref 30.0–36.0)
MCV: 96.3 fL (ref 80.0–100.0)
Monocytes Absolute: 0.9 10*3/uL (ref 0.1–1.0)
Monocytes Relative: 7 %
Neutro Abs: 8.3 10*3/uL — ABNORMAL HIGH (ref 1.7–7.7)
Neutrophils Relative %: 73 %
Platelet Count: 705 10*3/uL — ABNORMAL HIGH (ref 150–400)
RBC: 4.05 MIL/uL (ref 3.87–5.11)
RDW: 14.3 % (ref 11.5–15.5)
WBC Count: 11.4 10*3/uL — ABNORMAL HIGH (ref 4.0–10.5)
nRBC: 0 % (ref 0.0–0.2)

## 2022-12-28 LAB — CULTURE, BLOOD (ROUTINE X 2): Culture: NO GROWTH

## 2022-12-28 MED ORDER — PROCHLORPERAZINE MALEATE 10 MG PO TABS: 10.0000 mg | ORAL_TABLET | Freq: Four times a day (QID) | ORAL | 1 refills | Status: DC | PRN

## 2022-12-28 MED ORDER — ONDANSETRON HCL 8 MG PO TABS: 8.0000 mg | ORAL_TABLET | Freq: Three times a day (TID) | ORAL | 1 refills | Status: DC | PRN

## 2022-12-28 MED ORDER — LIDOCAINE-PRILOCAINE 2.5-2.5 % EX CREA: TOPICAL_CREAM | CUTANEOUS | 3 refills | Status: DC

## 2022-12-28 MED ORDER — SODIUM CHLORIDE 0.9 % IV SOLN
INTRAVENOUS | Status: AC
Start: 2022-12-28 — End: 2022-12-28

## 2022-12-28 NOTE — Patient Instructions (Signed)

## 2022-12-28 NOTE — Assessment & Plan Note (Signed)
-  [id number] -Diagnosed in November 2024 -She presented with recurrent abdominal pain, nausea, pancreatitis to hospital -Abdominal CT and MRI showed pancreatitis, large mass in the neck and head of pancreas with involvement of SMA and SMV, biopsy confirmed adenocarcinoma. -She was readmitted to hospital for recurrent nausea and vomiting, likely partial gastric outlet obstruction from pancreatic tumor.  She is able to tolerate full liquid diet  -Plan to start chemotherapy soon

## 2022-12-28 NOTE — Transitions of Care (Post Inpatient/ED Visit) (Signed)
   12/28/2022  Name: KIARNA HEGGER MRN: [id number] DOB: 08/19/1960  Today's TOC FU Call Status: Today's TOC FU Call Status:: Unsuccessful Call (1st Attempt) Unsuccessful Call (1st Attempt) Date: 12/28/22  Attempted to reach the patient regarding the most recent Inpatient/ED visit.  Follow Up Plan: Additional outreach attempts will be made to reach the patient to complete the Transitions of Care (Post Inpatient/ED visit) call.   Gean Maidens BSN RN Population Health- Transition of Care Team.  [facility] [phone]

## 2022-12-28 NOTE — Progress Notes (Signed)
START ON PATHWAY REGIMEN - Pancreatic Adenocarcinoma     A cycle is every 28 days:     Nab-paclitaxel (protein bound)      Gemcitabine   **Always confirm dose/schedule in your pharmacy ordering system**  Patient Characteristics: Preoperative, M0 (Clinical Staging), Borderline Resectable, PS ? 2, BRCA1/2 and PALB2 Mutation Absent/Unknown Therapeutic Status: Preoperative, M0 (Clinical Staging) AJCC T Category: cT2 AJCC N Category: cN0 Resectability Status: Borderline Resectable AJCC M Category: cM0 AJCC 8 Stage Grouping: IB ECOG Performance Status: 2 BRCA1/2 Mutation Status: Awaiting Test Results PALB2 Mutation Status: Awaiting Test Results Intent of Therapy: Curative Intent, Discussed with Patient

## 2022-12-28 NOTE — Progress Notes (Signed)
[facility]   [phone] Fax:([phone]) [phone]   Clinic Follow up Note   Patient Care Team: Allwardt, Crist Infante, PA-C as PCP - General (Physician Assistant) Geraldine Contras, MD as Referring Physician (Psychiatry) Malachy Mood, MD as Consulting Physician (Hematology and Oncology)  Date of Service:  12/28/2022  CHIEF COMPLAINT: f/u after recent hospital admission  CURRENT THERAPY:  Pending neoadjuvant chemotherapy  Oncology History   Pancreatic adenocarcinoma (HCC) -cT3N0Mo -Diagnosed in November 2024 -She presented with recurrent abdominal pain, nausea, pancreatitis to hospital -Abdominal CT and MRI showed pancreatitis, large mass in the neck and head of pancreas with involvement of SMA and SMV, biopsy confirmed adenocarcinoma. -She was readmitted to hospital for recurrent nausea and vomiting, likely partial gastric outlet obstruction from pancreatic tumor.  She is able to tolerate full liquid diet  -Plan to start chemotherapy soon    Assessment and Plan    Pancreatic Cancer Newly diagnosed pancreatic cancer with obstruction in the stomach and duodenum. Plan to start moderate intensity chemotherapy (gemcitabine and Abraxane) due to overall weakness. Discussed chemotherapy regimen, potential need for a procedure if tumor does not shrink, and weekly administration schedule with possible adjustments based on tolerance. - Start chemotherapy with gemcitabine and Abraxane -Chemotherapy consent: Side effects including but does not not limited to, fatigue, nausea, vomiting, diarrhea, hair loss, neuropathy, fluid retention, renal and kidney dysfunction, neutropenic fever, needed for blood transfusion, bleeding, were discussed with patient in great detail. She agrees to proceed. -The goal of chemotherapy is curative if she is able to have surgery - Schedule chemo class - Order genetic testing - Refer to dietician - Prescribe Zofran and Compazine for nausea - Prescribe  lidocaine cream for port access - Schedule follow-up appointments  Nutritional Deficiency Difficulty eating solid foods due to obstruction, currently on liquid nutrition (Boost Max, yogurt). Need for multivitamin supplementation due to limited intake of fruits and vegetables. Dietician to provide detailed nutritional plan. - Continue liquid nutrition (Boost Max, yogurt) - Start multivitamin supplementation - Refer to dietician for detailed nutritional plan  Dehydration and hypotension  Low blood pressure and dizziness likely due to dehydration. Blood counts indicate dehydration. Emphasized importance of maintaining hydration to avoid hospital readmission. - Administer IV fluids - Monitor hydration status  Pain Management Currently taking oxycodone for pain management. No current pain reported. Discussed importance of monitoring pain levels and adjusting medication as needed. - Continue oxycodone as needed - Monitor pain levels  General Health Maintenance Need for multivitamin and other supplements due to limited diet. Liquid forms of medications may be easier to tolerate. - Continue current medications (Eliquis, gabapentin, metyrapone, Protonix, Seroquel, vitamin D) - Start multivitamin supplementation  Plan -Due to hypertension, will give IV fluids today -I recommended neoadjuvant chemotherapy gemcitabine and Abraxane - Schedule chemo class - Schedule genetic testing.  -Follow-up with first cycle chemotherapy later this week or next week        SUMMARY OF ONCOLOGIC HISTORY: Oncology History  Pancreatic adenocarcinoma (HCC)  12/12/2022 Cancer Staging   Staging form: Exocrine Pancreas, AJCC 8th Edition - Clinical stage from 12/12/2022: Stage IIA (cT3, cN0, cM0) - Signed by Malachy Mood, MD on 12/28/2022 Total positive nodes: 0   12/16/2022 Initial Diagnosis   Pancreatic adenocarcinoma (HCC)   12/31/2022 -  Chemotherapy   Patient is on Treatment Plan : PANCREATIC Abraxane  D1,8,15 + Gemcitabine D1,8,15 q28d        Discussed the use of AI scribe software for clinical note transcription with the  patient, who gave verbal consent to proceed.  History of Present Illness   The patient is a 62 year old female who was recently diagnosed with pancreatic cancer. She was recently discharged from the hospital and is currently managing her diet with low fiber foods and protein smoothies. She is experiencing nocturia and urinary urgency, which is causing her significant discomfort and frustration. She is currently on several medications including Eliquis, Gabapentin, Metyrapone, Protonix, Seroquel, and a multivitamin.         All other systems were reviewed with the patient and are negative.  MEDICAL HISTORY:  Past Medical History:  Diagnosis Date   Alcohol abuse    Anxiety    Chickenpox    Depression    Neuromuscular disorder (HCC)    neuropathy   Pancreatic cancer (HCC)    Substance abuse (HCC)     SURGICAL HISTORY: Past Surgical History:  Procedure Laterality Date   BIOPSY  12/10/2022   Procedure: BIOPSY;  Surgeon: Mansouraty, Netty Starring., MD;  Location: [facility];  Service: Gastroenterology;;   CESAREAN SECTION     ESOPHAGOGASTRODUODENOSCOPY N/A 08/14/2021   Procedure: ESOPHAGOGASTRODUODENOSCOPY (EGD);  Surgeon: Rachael Fee, MD;  Location: [facility];  Service: Gastroenterology;  Laterality: N/A;   ESOPHAGOGASTRODUODENOSCOPY N/A 12/12/2022   Procedure: ESOPHAGOGASTRODUODENOSCOPY (EGD);  Surgeon: Lemar Lofty., MD;  Location: [facility];  Service: Gastroenterology;  Laterality: N/A;   ESOPHAGOGASTRODUODENOSCOPY (EGD) WITH PROPOFOL N/A 12/10/2022   Procedure: ESOPHAGOGASTRODUODENOSCOPY (EGD) WITH PROPOFOL;  Surgeon: Meridee Score Netty Starring., MD;  Location: [facility];  Service: Gastroenterology;  Laterality: N/A;   EUS N/A 08/14/2021   Procedure: UPPER ENDOSCOPIC ULTRASOUND (EUS) RADIAL;  Surgeon: Rachael Fee, MD;  Location: [facility];  Service: Gastroenterology;  Laterality: N/A;   EUS N/A 12/12/2022   Procedure: UPPER ENDOSCOPIC ULTRASOUND (EUS) LINEAR;  Surgeon: Lemar Lofty., MD;  Location: [facility];  Service: Gastroenterology;  Laterality: N/A;   FINE NEEDLE ASPIRATION N/A 08/14/2021   Procedure: FINE NEEDLE ASPIRATION (FNA) LINEAR;  Surgeon: Rachael Fee, MD;  Location: [facility];  Service: Gastroenterology;  Laterality: N/A;   FINE NEEDLE ASPIRATION N/A 12/12/2022   Procedure: FINE NEEDLE ASPIRATION (FNA) LINEAR;  Surgeon: Lemar Lofty., MD;  Location: [facility];  Service: Gastroenterology;  Laterality: N/A;   IR IMAGING GUIDED PORT INSERTION  12/18/2022   IR PERC CHOLECYSTOSTOMY  12/13/2022   TONSILLECTOMY      I have reviewed the social history and family history with the patient and they are unchanged from previous note.  ALLERGIES:  has No Known Allergies.  MEDICATIONS:  Current Outpatient Medications  Medication Sig Dispense Refill   apixaban (ELIQUIS) 5 MG TABS tablet Take 2 tablets (10 mg total) by mouth 2 (two) times daily for 7 days, THEN 1 tablet (5 mg total) 2 (two) times daily for 21 days. 60 tablet 0   APIXABAN (ELIQUIS) VTE STARTER PACK (10MG  AND 5MG ) Take as directed on package: start with two-5mg  tablets twice daily for 7 days. On day 8, switch to one-5mg  tablet twice daily. 74 each 0   Ensure Max Protein (ENSURE MAX PROTEIN) LIQD Take 330 mLs (11 oz total) by mouth 2 (two) times daily.     gabapentin (NEURONTIN) 300 MG capsule Take 2 capsules (600 mg total) by mouth 2 (two) times daily. (Patient taking differently: Take 900 mg by mouth at bedtime.) 60 capsule 0   lidocaine-prilocaine (EMLA) cream Apply to affected area once 30 g 3   mirtazapine (REMERON) 45 MG tablet Take  45 mg by mouth at bedtime.     ondansetron (ZOFRAN) 8 MG tablet Take 1 tablet (8 mg total) by mouth every 8 (eight) hours as needed for nausea or vomiting. 30 tablet 1   oxyCODONE (OXY  IR/ROXICODONE) 5 MG immediate release tablet Take 1 tablet (5 mg total) by mouth every 4 (four) hours as needed for moderate pain (pain score 4-6). 30 tablet 0   pantoprazole (PROTONIX) 40 MG tablet Take 1 tablet (40 mg total) by mouth 2 (two) times daily. 180 tablet 0   prochlorperazine (COMPAZINE) 10 MG tablet Take 1 tablet (10 mg total) by mouth every 6 (six) hours as needed for nausea or vomiting. 30 tablet 1   QUEtiapine (SEROQUEL) 100 MG tablet Take 200 mg by mouth at bedtime.     sodium chloride flush (NS) 0.9 % SOLN Use 5 - 10 mLs to flush abdominal drain once daily as directed 300 mL 3   sucralfate (CARAFATE) 1 GM/10ML suspension Take 10 mLs (1 g total) by mouth 4 (four) times daily -  with meals and at bedtime. 420 mL 1   [START ON 01/02/2023] Vitamin D, Ergocalciferol, (DRISDOL) 1.25 MG (50000 UNIT) CAPS capsule Take 1 capsule (50,000 Units total) by mouth every 7 (seven) days. 5 capsule 6   No current facility-administered medications for this visit.    PHYSICAL EXAMINATION: ECOG PERFORMANCE STATUS: 2 - Symptomatic, <50% confined to bed  Vitals:   12/28/22 1133 12/28/22 1134  BP: (!) 87/67 100/66  Pulse: (!) 125 (!) 120  Resp:    Temp:    SpO2:     Wt Readings from Last 3 Encounters:  12/28/22 100 lb 1.6 oz (45.4 kg)  12/27/22 105 lb 2.6 oz (47.7 kg)  12/23/22 100 lb (45.4 kg)     GENERAL:alert, no distress, chronically ill-appearing  SKIN: skin color, texture, turgor are normal, no rashes or significant lesions EYES: normal, Conjunctiva are pink and non-injected, sclera clear NECK: supple, thyroid normal size, non-tender, without nodularity LYMPH:  no palpable lymphadenopathy in the cervical, axillary  LUNGS: clear to auscultation and percussion with normal breathing effort HEART: regular rate & rhythm and no murmurs and no lower extremity edema ABDOMEN:abdomen soft, non-tender and normal bowel sounds Musculoskeletal:no cyanosis of digits and no clubbing  NEURO: alert  & oriented x 3 with fluent speech, no focal motor/sensory deficits   LABORATORY DATA:  I have reviewed the data as listed    Latest Ref Rng & Units 12/28/2022   11:07 AM 12/27/2022    4:20 AM 12/26/2022    4:30 AM  CBC  WBC 4.0 - 10.5 K/uL 11.4  8.5  9.8   Hemoglobin 12.0 - 15.0 g/dL 66.4  40.3  47.4   Hematocrit 36.0 - 46.0 % 39.0  31.8  31.7   Platelets 150 - 400 K/uL 705  528  546         Latest Ref Rng & Units 12/28/2022   11:07 AM 12/27/2022    4:20 AM 12/26/2022    4:30 AM  CMP  Glucose 70 - 99 mg/dL 259  563  875   BUN 8 - 23 mg/dL 8  6  8    Creatinine 0.44 - 1.00 mg/dL 6.43  3.29  5.18   Sodium 135 - 145 mmol/L 136  131  133   Potassium 3.5 - 5.1 mmol/L 4.3  3.8  3.9   Chloride 98 - 111 mmol/L 92  98  100   CO2 22 -  32 mmol/L 36  28  28   Calcium 8.9 - 10.3 mg/dL 40.1  8.5  8.3   Total Protein 6.5 - 8.1 g/dL 6.7  5.4  4.9   Total Bilirubin <1.2 mg/dL 1.3  1.5  1.0   Alkaline Phos 38 - 126 U/L 758  691  441   AST 15 - 41 U/L 23  41  20   ALT 0 - 44 U/L 16  18  11        RADIOGRAPHIC STUDIES: I have personally reviewed the radiological images as listed and agreed with the findings in the report. No results found.    Orders Placed This Encounter  Procedures   Consent Attestation for Oncology Treatment    Order Specific Question:   The patient is informed of risks, benefits, side-effects of the prescribed oncology treatment. Potential short term and long term side effects and response rates discussed. After a long discussion, the patient made informed decision to proceed.    Answer:   Yes   CBC with Differential (Cancer Center Only)    Standing Status:   Future    Standing Expiration Date:   12/31/2023   CMP (Cancer Center only)    Standing Status:   Future    Standing Expiration Date:   12/31/2023   CBC with Differential (Cancer Center Only)    Standing Status:   Future    Standing Expiration Date:   01/07/2024   CMP (Cancer Center only)    Standing Status:    Future    Standing Expiration Date:   01/07/2024   CBC with Differential (Cancer Center Only)    Standing Status:   Future    Standing Expiration Date:   01/21/2024   CMP (Cancer Center only)    Standing Status:   Future    Standing Expiration Date:   01/21/2024   CBC with Differential (Cancer Center Only)    Standing Status:   Future    Standing Expiration Date:   01/28/2024   CMP (Cancer Center only)    Standing Status:   Future    Standing Expiration Date:   01/28/2024   Ambulatory Referral to [facility] Nutrition    Referral Priority:   Urgent    Referral Type:   Consultation    Referral Reason:   Specialty Services Required    Number of Visits Requested:   1   All questions were answered. The patient knows to call the clinic with any problems, questions or concerns. No barriers to learning was detected. The total time spent in the appointment was 60 minutes, >50% on face-to-face counseling .     Malachy Mood, MD 12/28/2022

## 2022-12-29 ENCOUNTER — Telehealth: Payer: Self-pay

## 2022-12-29 ENCOUNTER — Other Ambulatory Visit: Payer: Self-pay

## 2022-12-29 LAB — CULTURE, BLOOD (ROUTINE X 2)
Culture: NO GROWTH
Special Requests: ADEQUATE

## 2022-12-29 LAB — CANCER ANTIGEN 19-9: CA 19-9: 445 U/mL — ABNORMAL HIGH (ref 0–35)

## 2022-12-29 NOTE — Transitions of Care (Post Inpatient/ED Visit) (Signed)
   12/29/2022  Name: MYKIAH MAUNG MRN: [id number] DOB: 07-15-1960  Today's TOC FU Call Status: Today's TOC FU Call Status:: Unsuccessful Call (2nd Attempt) Unsuccessful Call (2nd Attempt) Date: 12/29/22  Attempted to reach the patient regarding the most recent Inpatient/ED visit.  Follow Up Plan: Additional outreach attempts will be made to reach the patient to complete the Transitions of Care (Post Inpatient/ED visit) call.      Antionette Fairy, RN,BSN,CCM RN Care Manager Transitions of Care  [facility]  Direct Phone: [phone] Toll Free: [phone] Fax: [phone]

## 2022-12-29 NOTE — Transitions of Care (Post Inpatient/ED Visit) (Signed)
12/29/2022  Name: Kathryn Underwood MRN: [id number] DOB: 01-20-1961  Today's TOC FU Call Status: Today's TOC FU Call Status:: Successful TOC FU Call Completed TOC FU Call Complete Date: 12/29/22 Patient's Name and Date of Birth confirmed.  Transition Care Management Follow-up Telephone Call Date of Discharge: 12/27/22 Discharge Facility: [facility]) Type of Discharge: Inpatient Admission Primary Inpatient Discharge Diagnosis:: "intrabominal infection" How have you been since you were released from the hospital?: Same ('Doing ok"-saw oncologist yest-was "dehydrated and had to get IV infusions"-feels a little better-had some N&V late last night/early this AM-didn't take nausea med-forgot-will try taking as needed, appetite fair-Ensure Max 2x/day) Any questions or concerns?: No  Items Reviewed: Did you receive and understand the discharge instructions provided?: Yes Medications obtained,verified, and reconciled?: Yes (Medications Reviewed) Any new allergies since your discharge?: No Dietary orders reviewed?: Yes Type of Diet Ordered:: low salt/heart healthy Do you have support at home?: Yes People in Home: significant other Name of Support/Comfort Primary Source: Stan  Medications Reviewed Today: Medications Reviewed Today     Reviewed by Charlyn Minerva, RN (Registered Nurse) on 12/29/22 at 1351  Med List Status: <None>   Medication Order Taking? Sig Documenting Provider Last Dose Status Informant  apixaban (ELIQUIS) 5 MG TABS tablet [id number] Yes Take 2 tablets (10 mg total) by mouth 2 (two) times daily for 7 days, THEN 1 tablet (5 mg total) 2 (two) times daily for 21 days. Almon Hercules, MD Taking Active Self, Pharmacy Records, Spouse/Significant Other  APIXABAN Everlene Balls) VTE STARTER PACK (10MG  AND 5MG ) [id number]  Take as directed on package: start with two-5mg  tablets twice daily for 7 days. On day 8, switch to one-5mg  tablet twice daily. Almon Hercules, MD  Active  Self, Pharmacy Records, Spouse/Significant Other  Ensure Max Protein (ENSURE MAX PROTEIN) LIQD [id number] Yes Take 330 mLs (11 oz total) by mouth 2 (two) times daily. Almon Hercules, MD Taking Active Self, Pharmacy Records, Spouse/Significant Other  gabapentin (NEURONTIN) 300 MG capsule [id number] Yes Take 2 capsules (600 mg total) by mouth 2 (two) times daily.  Patient taking differently: Take 900 mg by mouth at bedtime.   Rolan Bucco, MD Taking Active Self, Pharmacy Records, Spouse/Significant Other  lidocaine-prilocaine (EMLA) cream [id number] Yes Apply to affected area once Malachy Mood, MD Taking Active   mirtazapine (REMERON) 45 MG tablet [id number] Yes Take 45 mg by mouth at bedtime. [provider] Taking Active Self, Pharmacy Records, Spouse/Significant Other  ondansetron (ZOFRAN) 8 MG tablet [id number] Yes Take 1 tablet (8 mg total) by mouth every 8 (eight) hours as needed for nausea or vomiting. Malachy Mood, MD Taking Active   oxyCODONE (OXY IR/ROXICODONE) 5 MG immediate release tablet [id number] Yes Take 1 tablet (5 mg total) by mouth every 4 (four) hours as needed for moderate pain (pain score 4-6). Alwyn Ren, MD Taking Active   pantoprazole (PROTONIX) 40 MG tablet [id number] Yes Take 1 tablet (40 mg total) by mouth 2 (two) times daily. Alwyn Ren, MD Taking Active   prochlorperazine (COMPAZINE) 10 MG tablet [id number] Yes Take 1 tablet (10 mg total) by mouth every 6 (six) hours as needed for nausea or vomiting. Malachy Mood, MD Taking Active   QUEtiapine (SEROQUEL) 100 MG tablet [id number] Yes Take 200 mg by mouth at bedtime. [provider] Taking Active Self, Pharmacy Records, Spouse/Significant Other  sodium chloride flush (NS) 0.9 % SOLN [id number] Yes Use 5 - 10 mLs to flush abdominal drain once daily as  directed Allred, Rosalita Levan, PA-C Taking Active Self, Pharmacy Records, Spouse/Significant Other  sucralfate (CARAFATE) 1 GM/10ML suspension [id number] Yes Take  10 mLs (1 g total) by mouth 4 (four) times daily -  with meals and at bedtime. Allwardt, Crist Infante, PA-C Taking Active Self, Pharmacy Records, Spouse/Significant Other  Vitamin D, Ergocalciferol, (DRISDOL) 1.25 MG (50000 UNIT) CAPS capsule [id number] Yes Take 1 capsule (50,000 Units total) by mouth every 7 (seven) days. Alwyn Ren, MD Taking Active             Home Care and Equipment/Supplies: Were Home Health Services Ordered?: NA Any new equipment or medical supplies ordered?: NA  Functional Questionnaire: Do you need assistance with bathing/showering or dressing?: No Do you need assistance with meal preparation?: No Do you need assistance with eating?: No Do you have difficulty maintaining continence: No Do you need assistance with getting out of bed/getting out of a chair/moving?: No Do you have difficulty managing or taking your medications?: No  Follow up appointments reviewed: PCP Follow-up appointment confirmed?: NA Specialist Hospital Follow-up appointment confirmed?: Yes Date of Specialist follow-up appointment?: 12/28/22 Follow-Up Specialty Provider:: Dr. Mosetta Putt Do you need transportation to your follow-up appointment?: No (pt confirms her boyfriend takes her to all appts) Do you understand care options if your condition(s) worsen?: Yes-patient verbalized understanding  SDOH Interventions Today    Flowsheet Row Most Recent Value  SDOH Interventions   Food Insecurity Interventions Intervention Not Indicated  Housing Interventions Intervention Not Indicated  Transportation Interventions Intervention Not Indicated  Utilities Interventions Intervention Not Indicated      TOC interventions discussed/reviewed: -Discussed/reviewed insurance/health plans benefits -Doctor visit discussed/reviewed -PCP -Doctor visits discussed/reviewed-Specialist -Provided Verbal Education: 30-day TOC program, nutrition, meds & their functions, pain mgmt, non-pharmacological and  pharmacological symptom mgmt. of N&V, fall/safety measures in the home -Disease mgmt. discussed/reviewed: cancer, chemo txs and sx mgmt measures   Antionette Fairy, RN,BSN,CCM RN Care Manager Transitions of Care  [place]  Direct Phone: [phone] Toll Free: [phone] Fax: [phone]

## 2022-12-30 ENCOUNTER — Telehealth: Payer: Self-pay | Admitting: Hematology

## 2022-12-31 ENCOUNTER — Other Ambulatory Visit: Payer: Medicaid Other

## 2023-01-01 ENCOUNTER — Other Ambulatory Visit: Payer: Self-pay

## 2023-01-03 ENCOUNTER — Inpatient Hospital Stay (HOSPITAL_COMMUNITY)
Admission: EM | Admit: 2023-01-03 | Discharge: 2023-01-12 | DRG: 871 | Disposition: A | Discharge status: Home Health | Payer: Medicaid Other | Attending: Family Medicine | Admitting: Family Medicine

## 2023-01-03 ENCOUNTER — Emergency Department (HOSPITAL_COMMUNITY): Payer: Medicaid Other

## 2023-01-03 ENCOUNTER — Other Ambulatory Visit: Payer: Self-pay

## 2023-01-03 DIAGNOSIS — D75839 Thrombocytosis, unspecified: Secondary | ICD-10-CM | POA: Diagnosis present

## 2023-01-03 DIAGNOSIS — K863 Pseudocyst of pancreas: Secondary | ICD-10-CM | POA: Diagnosis present

## 2023-01-03 DIAGNOSIS — J189 Pneumonia, unspecified organism: Secondary | ICD-10-CM | POA: Diagnosis present

## 2023-01-03 DIAGNOSIS — N170 Acute kidney failure with tubular necrosis: Secondary | ICD-10-CM | POA: Diagnosis present

## 2023-01-03 DIAGNOSIS — J690 Pneumonitis due to inhalation of food and vomit: Secondary | ICD-10-CM | POA: Diagnosis present

## 2023-01-03 DIAGNOSIS — Z681 Body mass index (BMI) 19 or less, adult: Secondary | ICD-10-CM

## 2023-01-03 DIAGNOSIS — Z86718 Personal history of other venous thrombosis and embolism: Secondary | ICD-10-CM

## 2023-01-03 DIAGNOSIS — K269 Duodenal ulcer, unspecified as acute or chronic, without hemorrhage or perforation: Secondary | ICD-10-CM | POA: Diagnosis present

## 2023-01-03 DIAGNOSIS — F1020 Alcohol dependence, uncomplicated: Secondary | ICD-10-CM | POA: Diagnosis present

## 2023-01-03 DIAGNOSIS — K3189 Other diseases of stomach and duodenum: Secondary | ICD-10-CM | POA: Diagnosis present

## 2023-01-03 DIAGNOSIS — Z66 Do not resuscitate: Secondary | ICD-10-CM | POA: Diagnosis present

## 2023-01-03 DIAGNOSIS — R6510 Systemic inflammatory response syndrome (SIRS) of non-infectious origin without acute organ dysfunction: Secondary | ICD-10-CM | POA: Diagnosis not present

## 2023-01-03 DIAGNOSIS — F319 Bipolar disorder, unspecified: Secondary | ICD-10-CM | POA: Diagnosis present

## 2023-01-03 DIAGNOSIS — Z79899 Other long term (current) drug therapy: Secondary | ICD-10-CM

## 2023-01-03 DIAGNOSIS — E8809 Other disorders of plasma-protein metabolism, not elsewhere classified: Secondary | ICD-10-CM | POA: Diagnosis present

## 2023-01-03 DIAGNOSIS — Z825 Family history of asthma and other chronic lower respiratory diseases: Secondary | ICD-10-CM

## 2023-01-03 DIAGNOSIS — K208 Other esophagitis without bleeding: Secondary | ICD-10-CM | POA: Diagnosis not present

## 2023-01-03 DIAGNOSIS — R64 Cachexia: Secondary | ICD-10-CM | POA: Diagnosis present

## 2023-01-03 DIAGNOSIS — K311 Adult hypertrophic pyloric stenosis: Secondary | ICD-10-CM | POA: Diagnosis present

## 2023-01-03 DIAGNOSIS — R627 Adult failure to thrive: Secondary | ICD-10-CM | POA: Diagnosis not present

## 2023-01-03 DIAGNOSIS — Z8261 Family history of arthritis: Secondary | ICD-10-CM

## 2023-01-03 DIAGNOSIS — E872 Acidosis, unspecified: Secondary | ICD-10-CM | POA: Diagnosis present

## 2023-01-03 DIAGNOSIS — F17210 Nicotine dependence, cigarettes, uncomplicated: Secondary | ICD-10-CM | POA: Diagnosis present

## 2023-01-03 DIAGNOSIS — E43 Unspecified severe protein-calorie malnutrition: Secondary | ICD-10-CM | POA: Diagnosis present

## 2023-01-03 DIAGNOSIS — C259 Malignant neoplasm of pancreas, unspecified: Secondary | ICD-10-CM | POA: Diagnosis present

## 2023-01-03 DIAGNOSIS — A419 Sepsis, unspecified organism: Principal | ICD-10-CM | POA: Diagnosis present

## 2023-01-03 DIAGNOSIS — E871 Hypo-osmolality and hyponatremia: Secondary | ICD-10-CM | POA: Diagnosis present

## 2023-01-03 DIAGNOSIS — K449 Diaphragmatic hernia without obstruction or gangrene: Secondary | ICD-10-CM | POA: Diagnosis not present

## 2023-01-03 DIAGNOSIS — C258 Malignant neoplasm of overlapping sites of pancreas: Secondary | ICD-10-CM | POA: Diagnosis present

## 2023-01-03 DIAGNOSIS — K859 Acute pancreatitis without necrosis or infection, unspecified: Secondary | ICD-10-CM | POA: Diagnosis present

## 2023-01-03 DIAGNOSIS — Z820 Family history of epilepsy and other diseases of the nervous system: Secondary | ICD-10-CM

## 2023-01-03 DIAGNOSIS — E861 Hypovolemia: Secondary | ICD-10-CM | POA: Diagnosis present

## 2023-01-03 DIAGNOSIS — R531 Weakness: Secondary | ICD-10-CM | POA: Diagnosis not present

## 2023-01-03 DIAGNOSIS — R54 Age-related physical debility: Secondary | ICD-10-CM | POA: Diagnosis present

## 2023-01-03 DIAGNOSIS — Z515 Encounter for palliative care: Secondary | ICD-10-CM

## 2023-01-03 DIAGNOSIS — J9811 Atelectasis: Secondary | ICD-10-CM | POA: Diagnosis present

## 2023-01-03 DIAGNOSIS — E873 Alkalosis: Secondary | ICD-10-CM | POA: Diagnosis present

## 2023-01-03 DIAGNOSIS — R160 Hepatomegaly, not elsewhere classified: Secondary | ICD-10-CM | POA: Diagnosis present

## 2023-01-03 DIAGNOSIS — I81 Portal vein thrombosis: Secondary | ICD-10-CM | POA: Diagnosis present

## 2023-01-03 DIAGNOSIS — E876 Hypokalemia: Secondary | ICD-10-CM | POA: Diagnosis not present

## 2023-01-03 DIAGNOSIS — J440 Chronic obstructive pulmonary disease with acute lower respiratory infection: Secondary | ICD-10-CM | POA: Diagnosis present

## 2023-01-03 DIAGNOSIS — D630 Anemia in neoplastic disease: Secondary | ICD-10-CM | POA: Diagnosis present

## 2023-01-03 DIAGNOSIS — K862 Cyst of pancreas: Secondary | ICD-10-CM | POA: Diagnosis not present

## 2023-01-03 DIAGNOSIS — K769 Liver disease, unspecified: Secondary | ICD-10-CM | POA: Diagnosis present

## 2023-01-03 DIAGNOSIS — Z818 Family history of other mental and behavioral disorders: Secondary | ICD-10-CM

## 2023-01-03 DIAGNOSIS — E778 Other disorders of glycoprotein metabolism: Secondary | ICD-10-CM | POA: Diagnosis present

## 2023-01-03 DIAGNOSIS — F418 Other specified anxiety disorders: Secondary | ICD-10-CM | POA: Diagnosis present

## 2023-01-03 DIAGNOSIS — R6251 Failure to thrive (child): Secondary | ICD-10-CM

## 2023-01-03 DIAGNOSIS — Z7901 Long term (current) use of anticoagulants: Secondary | ICD-10-CM

## 2023-01-03 DIAGNOSIS — Z833 Family history of diabetes mellitus: Secondary | ICD-10-CM

## 2023-01-03 DIAGNOSIS — D72829 Elevated white blood cell count, unspecified: Secondary | ICD-10-CM | POA: Diagnosis not present

## 2023-01-03 DIAGNOSIS — G629 Polyneuropathy, unspecified: Secondary | ICD-10-CM | POA: Diagnosis present

## 2023-01-03 DIAGNOSIS — K315 Obstruction of duodenum: Secondary | ICD-10-CM | POA: Diagnosis present

## 2023-01-03 DIAGNOSIS — R634 Abnormal weight loss: Secondary | ICD-10-CM | POA: Diagnosis not present

## 2023-01-03 DIAGNOSIS — R6520 Severe sepsis without septic shock: Secondary | ICD-10-CM | POA: Diagnosis present

## 2023-01-03 LAB — BASIC METABOLIC PANEL
Anion gap: 11 (ref 5–15)
BUN: 55 mg/dL — ABNORMAL HIGH (ref 8–23)
CO2: 38 mmol/L — ABNORMAL HIGH (ref 22–32)
Calcium: 8.2 mg/dL — ABNORMAL LOW (ref 8.9–10.3)
Chloride: 73 mmol/L — ABNORMAL LOW (ref 98–111)
Creatinine, Ser: 0.68 mg/dL (ref 0.44–1.00)
GFR, Estimated: >60 mL/min (ref >60)
Glucose, Bld: 145 mg/dL — ABNORMAL HIGH (ref 70–99)
Potassium: 2.8 mmol/L — ABNORMAL LOW (ref 3.5–5.1)
Sodium: 122 mmol/L — ABNORMAL LOW (ref 135–145)

## 2023-01-03 LAB — COMPREHENSIVE METABOLIC PANEL
ALT: 10 U/L (ref 0–44)
AST: 17 U/L (ref 15–41)
Albumin: 2.6 g/dL — ABNORMAL LOW (ref 3.5–5.0)
Alkaline Phosphatase: 139 U/L — ABNORMAL HIGH (ref 38–126)
Anion gap: 16 — ABNORMAL HIGH (ref 5–15)
BUN: 57 mg/dL — ABNORMAL HIGH (ref 8–23)
CO2: 38 mmol/L — ABNORMAL HIGH (ref 22–32)
Calcium: 8.1 mg/dL — ABNORMAL LOW (ref 8.9–10.3)
Chloride: 65 mmol/L — ABNORMAL LOW (ref 98–111)
Creatinine, Ser: 0.82 mg/dL (ref 0.44–1.00)
GFR, Estimated: >60 mL/min (ref >60)
Glucose, Bld: 147 mg/dL — ABNORMAL HIGH (ref 70–99)
Potassium: 2.6 mmol/L — CL (ref 3.5–5.1)
Sodium: 119 mmol/L — CL (ref 135–145)
Total Bilirubin: 0.4 mg/dL (ref <1.2)
Total Protein: 5.4 g/dL — ABNORMAL LOW (ref 6.5–8.1)

## 2023-01-03 LAB — BLOOD GAS, VENOUS
Acid-Base Excess: 35.3 mmol/L — ABNORMAL HIGH (ref 0.0–2.0)
Bicarbonate: 62.8 mmol/L — ABNORMAL HIGH (ref 20.0–28.0)
O2 Saturation: 35.1 %
Patient temperature: 37
pCO2, Ven: 67 mm[Hg] — ABNORMAL HIGH (ref 44–60)
pH, Ven: 7.58 — ABNORMAL HIGH (ref 7.25–7.43)
pO2, Ven: <31 mm[Hg] — CL (ref 32–45)

## 2023-01-03 LAB — URINALYSIS, ROUTINE W REFLEX MICROSCOPIC
Bilirubin Urine: NEGATIVE
Glucose, UA: NEGATIVE mg/dL
Hgb urine dipstick: NEGATIVE
Ketones, ur: NEGATIVE mg/dL
Leukocytes,Ua: NEGATIVE
Nitrite: NEGATIVE
Protein, ur: NEGATIVE mg/dL
Specific Gravity, Urine: 1.015 (ref 1.005–1.030)
pH: 5 (ref 5.0–8.0)

## 2023-01-03 LAB — CBC
HCT: 34.3 % — ABNORMAL LOW (ref 36.0–46.0)
Hemoglobin: 11.9 g/dL — ABNORMAL LOW (ref 12.0–15.0)
MCH: 32.8 pg (ref 26.0–34.0)
MCHC: 34.7 g/dL (ref 30.0–36.0)
MCV: 94.5 fL (ref 80.0–100.0)
Platelets: 615 10*3/uL — ABNORMAL HIGH (ref 150–400)
RBC: 3.63 MIL/uL — ABNORMAL LOW (ref 3.87–5.11)
RDW: 13.2 % (ref 11.5–15.5)
WBC: 31 10*3/uL — ABNORMAL HIGH (ref 4.0–10.5)
nRBC: 0 % (ref 0.0–0.2)

## 2023-01-03 LAB — I-STAT CG4 LACTIC ACID, ED
Lactic Acid, Venous: 2.8 mmol/L (ref 0.5–1.9)
Lactic Acid, Venous: 1.1 mmol/L (ref 0.5–1.9)

## 2023-01-03 LAB — LIPASE, BLOOD: Lipase: 43 U/L (ref 11–51)

## 2023-01-03 MED ORDER — PANTOPRAZOLE SODIUM 40 MG IV SOLR
40.0000 mg | Freq: Two times a day (BID) | INTRAVENOUS | Status: DC
Start: 2023-01-03 — End: 2023-01-12
  Administered 2023-01-03 – 2023-01-12 (×18): 40 mg via INTRAVENOUS
  Filled 2023-01-03 (×18): qty 10

## 2023-01-03 MED ORDER — SODIUM CHLORIDE 0.9 % IV BOLUS: 150.0000 mL | Freq: Once | INTRAVENOUS | Status: DC

## 2023-01-03 MED ORDER — ONDANSETRON HCL 4 MG/2ML IJ SOLN: 4.0000 mg | Freq: Four times a day (QID) | INTRAMUSCULAR | Status: DC | PRN

## 2023-01-03 MED ORDER — SODIUM CHLORIDE 0.9 % IV SOLN
3.0000 g | Freq: Four times a day (QID) | INTRAVENOUS | Status: DC
  Administered 2023-01-04 – 2023-01-10 (×26): 3 g via INTRAVENOUS
  Filled 2023-01-03 (×26): qty 8

## 2023-01-03 MED ORDER — SODIUM CHLORIDE 0.9 % IV SOLN
1.0000 g | INTRAVENOUS | Status: DC
  Administered 2023-01-03: 1 g via INTRAVENOUS
  Filled 2023-01-03: qty 10

## 2023-01-03 MED ORDER — POTASSIUM CHLORIDE 10 MEQ/100ML IV SOLN
10.0000 meq | INTRAVENOUS | Status: DC
  Filled 2023-01-03 (×4): qty 100

## 2023-01-03 MED ORDER — CHLORHEXIDINE GLUCONATE CLOTH 2 % EX PADS
6.0000 | MEDICATED_PAD | Freq: Every day | CUTANEOUS | Status: DC
  Administered 2023-01-04 – 2023-01-12 (×8): 6 via TOPICAL

## 2023-01-03 MED ORDER — QUETIAPINE FUMARATE 200 MG PO TABS
200.0000 mg | ORAL_TABLET | Freq: Every day | ORAL | Status: DC
  Administered 2023-01-03 – 2023-01-11 (×8): 200 mg via ORAL
  Filled 2023-01-03 (×2): qty 1
  Filled 2023-01-03 (×3): qty 2
  Filled 2023-01-03 (×4): qty 1

## 2023-01-03 MED ORDER — MORPHINE SULFATE (PF) 2 MG/ML IV SOLN
2.0000 mg | INTRAVENOUS | Status: DC | PRN
  Administered 2023-01-04: 2 mg via INTRAVENOUS
  Administered 2023-01-04 – 2023-01-05 (×2): 4 mg via INTRAVENOUS
  Administered 2023-01-05 – 2023-01-08 (×6): 2 mg via INTRAVENOUS
  Administered 2023-01-09: 4 mg via INTRAVENOUS
  Administered 2023-01-09 (×5): 2 mg via INTRAVENOUS
  Administered 2023-01-10 – 2023-01-12 (×11): 4 mg via INTRAVENOUS
  Administered 2023-01-12: 2 mg via INTRAVENOUS
  Administered 2023-01-12: 4 mg via INTRAVENOUS
  Filled 2023-01-03: qty 2
  Filled 2023-01-03: qty 1
  Filled 2023-01-03 (×3): qty 2
  Filled 2023-01-03 (×2): qty 1
  Filled 2023-01-03: qty 2
  Filled 2023-01-03: qty 1
  Filled 2023-01-03: qty 2
  Filled 2023-01-03: qty 1
  Filled 2023-01-03: qty 2
  Filled 2023-01-03: qty 1
  Filled 2023-01-03: qty 2
  Filled 2023-01-03: qty 1
  Filled 2023-01-03: qty 2
  Filled 2023-01-03 (×4): qty 1
  Filled 2023-01-03: qty 2
  Filled 2023-01-03 (×2): qty 1
  Filled 2023-01-03: qty 2
  Filled 2023-01-03: qty 1
  Filled 2023-01-03: qty 2
  Filled 2023-01-03: qty 1
  Filled 2023-01-03 (×3): qty 2

## 2023-01-03 MED ORDER — ENOXAPARIN SODIUM 60 MG/0.6ML IJ SOSY
45.0000 mg | PREFILLED_SYRINGE | Freq: Two times a day (BID) | INTRAMUSCULAR | Status: DC
  Administered 2023-01-03: 45 mg via SUBCUTANEOUS
  Filled 2023-01-03 (×2): qty 0.6

## 2023-01-03 MED ORDER — PANTOPRAZOLE SODIUM 40 MG IV SOLR: 40.0000 mg | Freq: Two times a day (BID) | INTRAVENOUS | Status: DC

## 2023-01-03 MED ORDER — APIXABAN 5 MG PO TABS: 5.0000 mg | ORAL_TABLET | Freq: Two times a day (BID) | ORAL | Status: DC

## 2023-01-03 MED ORDER — SODIUM CHLORIDE 0.9 % IV SOLN
INTRAVENOUS | Status: DC
Start: 2023-01-03 — End: 2023-01-04

## 2023-01-03 MED ORDER — SUCRALFATE 1 GM/10ML PO SUSP
1.0000 g | Freq: Three times a day (TID) | ORAL | Status: DC
Start: 2023-01-03 — End: 2023-01-12
  Administered 2023-01-03 – 2023-01-12 (×28): 1 g via ORAL
  Filled 2023-01-03 (×28): qty 10

## 2023-01-03 MED ORDER — OXYCODONE HCL 5 MG PO TABS: 5.0000 mg | ORAL_TABLET | ORAL | Status: DC | PRN

## 2023-01-03 MED ORDER — SODIUM CHLORIDE 0.9 % IV SOLN
500.0000 mg | INTRAVENOUS | Status: DC
  Administered 2023-01-03: 500 mg via INTRAVENOUS
  Filled 2023-01-03: qty 5

## 2023-01-03 MED ORDER — MIRTAZAPINE 15 MG PO TABS
45.0000 mg | ORAL_TABLET | Freq: Every day | ORAL | Status: DC
  Administered 2023-01-03 – 2023-01-11 (×8): 45 mg via ORAL
  Filled 2023-01-03: qty 2
  Filled 2023-01-03 (×4): qty 3
  Filled 2023-01-03: qty 2
  Filled 2023-01-03: qty 3
  Filled 2023-01-03: qty 2
  Filled 2023-01-03: qty 3

## 2023-01-03 MED ORDER — MORPHINE SULFATE (PF) 2 MG/ML IV SOLN: 2.0000 mg | INTRAVENOUS | Status: DC | PRN

## 2023-01-03 MED ORDER — SODIUM CHLORIDE 0.9 % IV BOLUS
1000.0000 mL | Freq: Once | INTRAVENOUS | Status: AC
  Administered 2023-01-03: 1000 mL via INTRAVENOUS

## 2023-01-03 MED ORDER — PANTOPRAZOLE SODIUM 40 MG PO TBEC: 40.0000 mg | DELAYED_RELEASE_TABLET | Freq: Two times a day (BID) | ORAL | Status: DC

## 2023-01-03 MED ORDER — ONDANSETRON HCL 4 MG PO TABS: 4.0000 mg | ORAL_TABLET | Freq: Four times a day (QID) | ORAL | Status: DC | PRN

## 2023-01-03 MED ORDER — POTASSIUM CHLORIDE 10 MEQ/100ML IV SOLN
10.0000 meq | INTRAVENOUS | Status: AC
  Administered 2023-01-03 – 2023-01-04 (×4): 10 meq via INTRAVENOUS
  Filled 2023-01-03 (×4): qty 100

## 2023-01-03 MED ORDER — SODIUM CHLORIDE 0.9% FLUSH: 10.0000 mL | INTRAVENOUS | Status: DC | PRN

## 2023-01-03 NOTE — Assessment & Plan Note (Signed)
Not yet able to start chemo or radiation.

## 2023-01-03 NOTE — ED Triage Notes (Signed)
Pt arrived via POV. C/o N/V for several days and accompanying weakness.  Pancreatic cancer. Begins chemo soon.  Could not get a good Spo2 sat in triage.  AOx4

## 2023-01-03 NOTE — Progress Notes (Signed)
PHARMACY NOTE -  Unasyn  Pharmacy has been assisting with dosing of Unasyn for aspiration pneumonia. Dosage remains stable at 3g IV q6 hr and further renal adjustments per institutional Pharmacy antibiotic protocol  Pharmacy will sign off, following peripherally for culture results, dose adjustments, and length of therapy. Please reconsult if a change in clinical status warrants re-evaluation of dosage.  Bernadene Person, PharmD, BCPS [phone] 01/03/2023, 9:54 PM

## 2023-01-03 NOTE — Assessment & Plan Note (Signed)
RD consult, I suspect she's gonna need to go on TPN.

## 2023-01-03 NOTE — ED Notes (Signed)
ED TO INPATIENT HANDOFF REPORT  ED Nurse Name and Phone #: Sunday Spillers, RN (ED)  S Name/Age/Gender Kathryn Underwood 62 y.o. female Room/Bed: WA14/WA14  Code Status   Code Status: Full Code  Home/SNF/Other Home Patient oriented to: self, place, time, and situation Is this baseline? Yes   Triage Complete: Triage complete  Chief Complaint Hyponatremia [E87.1]  Triage Note Pt arrived via POV. C/o N/V for several days and accompanying weakness.  Pancreatic cancer. Begins chemo soon.  Could not get a good Spo2 sat in triage.  AOx4   Allergies No Known Allergies  Level of Care/Admitting Diagnosis ED Disposition     ED Disposition  Admit   Condition  --   Comment  Hospital Area: [facility] [[id number]]  Level of Care: Progressive [102]  Admit to Progressive based on following criteria: GI, ENDOCRINE disease patients with GI bleeding, acute liver failure or pancreatitis, stable with diabetic ketoacidosis or thyrotoxicosis (hypothyroid) state.  Admit to Progressive based on following criteria: MULTISYSTEM THREATS such as stable sepsis, metabolic/electrolyte imbalance with or without encephalopathy that is responding to early treatment.  May admit patient to [facility] or [facility] if equivalent level of care is available:: No  Covid Evaluation: Asymptomatic - no recent exposure (last 10 days) testing not required  Diagnosis: Hyponatremia [161096]  Admitting Physician: Hillary Bow [[id number]]  Attending Physician: Hillary Bow [phone]  Certification:: I certify this patient will need inpatient services for at least 2 midnights  Expected Medical Readiness: 01/08/2023          B Medical/Surgery History Past Medical History:  Diagnosis Date   Alcohol abuse    Anxiety    Chickenpox    Depression    Neuromuscular disorder (HCC)    neuropathy   Pancreatic cancer (HCC)    Substance abuse (HCC)    Past Surgical History:  Procedure Laterality  Date   BIOPSY  12/10/2022   Procedure: BIOPSY;  Surgeon: Lemar Lofty., MD;  Location: [facility];  Service: Gastroenterology;;   CESAREAN SECTION     ESOPHAGOGASTRODUODENOSCOPY N/A 08/14/2021   Procedure: ESOPHAGOGASTRODUODENOSCOPY (EGD);  Surgeon: Rachael Fee, MD;  Location: [facility];  Service: Gastroenterology;  Laterality: N/A;   ESOPHAGOGASTRODUODENOSCOPY N/A 12/12/2022   Procedure: ESOPHAGOGASTRODUODENOSCOPY (EGD);  Surgeon: Lemar Lofty., MD;  Location: [facility];  Service: Gastroenterology;  Laterality: N/A;   ESOPHAGOGASTRODUODENOSCOPY (EGD) WITH PROPOFOL N/A 12/10/2022   Procedure: ESOPHAGOGASTRODUODENOSCOPY (EGD) WITH PROPOFOL;  Surgeon: Meridee Score Netty Starring., MD;  Location: [facility];  Service: Gastroenterology;  Laterality: N/A;   EUS N/A 08/14/2021   Procedure: UPPER ENDOSCOPIC ULTRASOUND (EUS) RADIAL;  Surgeon: Rachael Fee, MD;  Location: [facility];  Service: Gastroenterology;  Laterality: N/A;   EUS N/A 12/12/2022   Procedure: UPPER ENDOSCOPIC ULTRASOUND (EUS) LINEAR;  Surgeon: Lemar Lofty., MD;  Location: [facility];  Service: Gastroenterology;  Laterality: N/A;   FINE NEEDLE ASPIRATION N/A 08/14/2021   Procedure: FINE NEEDLE ASPIRATION (FNA) LINEAR;  Surgeon: Rachael Fee, MD;  Location: [facility];  Service: Gastroenterology;  Laterality: N/A;   FINE NEEDLE ASPIRATION N/A 12/12/2022   Procedure: FINE NEEDLE ASPIRATION (FNA) LINEAR;  Surgeon: Lemar Lofty., MD;  Location: [facility];  Service: Gastroenterology;  Laterality: N/A;   IR IMAGING GUIDED PORT INSERTION  12/18/2022   IR PERC CHOLECYSTOSTOMY  12/13/2022   TONSILLECTOMY       A IV Location/Drains/Wounds Patient Lines/Drains/Airways Status     Active Line/Drains/Airways  Name Placement date Placement time Site Days   Implanted Port 12/18/22 Left Chest 12/18/22  1035  Chest  16   Biliary Tube Cook slip-coat 10 Fr. RUQ 12/13/22  1655   RUQ  21   Wound / Incision (Open or Dehisced) 12/18/22 Puncture Neck Left venous access for [facility] 12/18/22  1051  Neck  16            Intake/Output Last 24 hours  Intake/Output Summary (Last 24 hours) at 01/03/2023 2150 Last data filed at 01/03/2023 2146 Gross per 24 hour  Intake 750 ml  Output --  Net 750 ml    Labs/Imaging Results for orders placed or performed during the hospital encounter of 01/03/23 (from the past 48 hour(s))  CBC     Status: Abnormal   Collection Time: 01/03/23  5:02 PM  Result Value Ref Range   WBC 31.0 (H) 4.0 - 10.5 K/uL   RBC 3.63 (L) 3.87 - 5.11 MIL/uL   Hemoglobin 11.9 (L) 12.0 - 15.0 g/dL   HCT 09.8 (L) 11.9 - 14.7 %   MCV 94.5 80.0 - 100.0 fL   MCH 32.8 26.0 - 34.0 pg   MCHC 34.7 30.0 - 36.0 g/dL   RDW 82.9 56.2 - 13.0 %   Platelets 615 (H) 150 - 400 K/uL   nRBC 0.0 0.0 - 0.2 %    Comment: Performed at [facility], [address], [place], [place] [address]  Blood gas, venous (at [facility] and [facility])     Status: Abnormal   Collection Time: 01/03/23  5:02 PM  Result Value Ref Range   pH, Ven 7.58 (H) 7.25 - 7.43   pCO2, Ven 67 (H) 44 - 60 mmHg   pO2, Ven <31 (LL) 32 - 45 mmHg    Comment: CRITICAL RESULT CALLED TO, READ BACK BY AND VERIFIED WITH: KISER,C. RN AT 4696 01/03/23 MULLINS,T    Bicarbonate 62.8 (H) 20.0 - 28.0 mmol/L   Acid-Base Excess 35.3 (H) 0.0 - 2.0 mmol/L   O2 Saturation 35.1 %   Patient temperature 37.0     Comment: Performed at [facility], [address] [place], [place] [address]  I-Stat CG4 Lactic Acid     Status: Abnormal   Collection Time: 01/03/23  5:16 PM  Result Value Ref Range   Lactic Acid, Venous 2.8 (HH) 0.5 - 1.9 mmol/L   Comment NOTIFIED PHYSICIAN   Comprehensive metabolic panel     Status: Abnormal   Collection Time: 01/03/23  6:50 PM  Result Value Ref Range   Sodium 119 (LL) 135 - 145 mmol/L    Comment: CRITICAL RESULT CALLED TO, READ BACK BY AND VERIFIED WITH Vedia Coffer, M  RN @ [phone] 01/03/23. GILBERTL    Potassium 2.6 (LL) 3.5 - 5.1 mmol/L    Comment: CRITICAL RESULT CALLED TO, READ BACK BY AND VERIFIED WITH Vedia Coffer, M RN @ [phone] 01/03/23. GILBERTL    Chloride 65 (L) 98 - 111 mmol/L   CO2 38 (H) 22 - 32 mmol/L   Glucose, Bld 147 (H) 70 - 99 mg/dL    Comment: Glucose reference range applies only to samples taken after fasting for at least 8 hours.   BUN 57 (H) 8 - 23 mg/dL   Creatinine, Ser 0.27 0.44 - 1.00 mg/dL   Calcium 8.1 (L) 8.9 - 10.3 mg/dL   Total Protein 5.4 (L) 6.5 - 8.1 g/dL   Albumin 2.6 (L) 3.5 - 5.0 g/dL   AST 17 15 -  41 U/L   ALT 10 0 - 44 U/L   Alkaline Phosphatase 139 (H) 38 - 126 U/L   Total Bilirubin 0.4 <1.2 mg/dL   GFR, Estimated >60 >45 mL/min    Comment: (NOTE) Calculated using the CKD-EPI Creatinine Equation (2021)    Anion gap 16 (H) 5 - 15    Comment: Performed at [facility], [address] [place], [place] [address]  Lipase, blood     Status: None   Collection Time: 01/03/23  6:50 PM  Result Value Ref Range   Lipase 43 11 - 51 U/L    Comment: Performed at [facility], [address], [place], [place] [address]  I-Stat CG4 Lactic Acid     Status: None   Collection Time: 01/03/23  7:49 PM  Result Value Ref Range   Lactic Acid, Venous 1.1 0.5 - 1.9 mmol/L  Urinalysis, Routine w reflex microscopic -Urine, Clean Catch     Status: None   Collection Time: 01/03/23  9:33 PM  Result Value Ref Range   Color, Urine YELLOW YELLOW   APPearance CLEAR CLEAR   Specific Gravity, Urine 1.015 1.005 - 1.030   pH 5.0 5.0 - 8.0   Glucose, UA NEGATIVE NEGATIVE mg/dL   Hgb urine dipstick NEGATIVE NEGATIVE   Bilirubin Urine NEGATIVE NEGATIVE   Ketones, ur NEGATIVE NEGATIVE mg/dL   Protein, ur NEGATIVE NEGATIVE mg/dL   Nitrite NEGATIVE NEGATIVE   Leukocytes,Ua NEGATIVE NEGATIVE    Comment: Performed at [facility], [address] [place], [place] [address]   CT ABDOMEN PELVIS WO  CONTRAST  Result Date: 01/03/2023 CLINICAL DATA:  Abdominal pain. Nausea vomiting for several days. Reported history of pancreatic carcinoma. EXAM: CT ABDOMEN AND PELVIS WITHOUT CONTRAST TECHNIQUE: Multidetector CT imaging of the abdomen and pelvis was performed following the standard protocol without IV contrast. RADIATION DOSE REDUCTION: This exam was performed according to the departmental dose-optimization program which includes automated exposure control, adjustment of the mA and/or kV according to patient size and/or use of iterative reconstruction technique. COMPARISON:  12/23/2022. FINDINGS: Lower chest: Ill-defined peribronchovascular ground-glass and more confluent opacities, most evident in the left lower lobe, new since the prior CT. No pleural effusion. Small pericardial effusion similar to the prior CT. Fluid mildly distends the distal esophagus. Hepatobiliary: Ill-defined hypoattenuation involving segments 4 and 5 as well as portions of segment 6, better defined on the prior contrast enhanced CT, consistent with metastatic disease. There also several more well-defined hypoattenuating liver masses there are stable. No new liver abnormalities. Pigtail drainage catheter lies along the inferior margin of the right lobe, unchanged. Pancreas: Heterogeneous mass with ill-defined margins arises from the pancreatic head with coarse central calcifications. This is unchanged from the prior CT. Fluid collection abuts the right inferior pancreatic head and adjacent duodenum measuring 3.7 cm. Larger fluid collection abuts the proximal pancreatic tail, posterior to the stomach, 5.8 cm in size. These are similar to the prior CT suspected to be pseudocysts. There is some fat stranding adjacent to the pancreas, similar to the prior CT. Spleen: Normal. Adrenals/Urinary Tract: Mild bilateral adrenal gland thickening consistent with hyperplasia. Normal kidneys, ureters and bladder. Stomach/Bowel: Significant gastric  distension. No stomach wall thickening. Duodenum broadly abuts the pancreatic head mass. Wall not defined separate from the mass on this unenhanced exam. Small bowel and colon are normal in caliber. No wall thickening or inflammation. Mild to moderate increase in the colonic stool burden similar to the prior  CT. Vascular/Lymphatic: Aortic atherosclerotic calcifications. No defined enlarged lymph nodes. Reproductive: Partly calcified uterine fibroids.  No adnexal masses. Other: No ascites. Musculoskeletal: No fracture or acute finding.  No bone lesion. IMPRESSION: 1. Significant stomach distention. This is suspected to be due to partial gastric outlet obstruction at the level of the duodenum due to the large pancreatic mass. 2. Pancreatic mass, liver lesions and right upper quadrant drainage catheter are without significant change from the recent prior CT, better defined on the prior study due to the presence of intravenous contrast. 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 These are also unchanged from the recent prior CT. Electronically Signed   By: Amie Portland M.D.   On: 01/03/2023 19:09   DG Chest Portable 1 View  Result Date: 01/03/2023 CLINICAL DATA:  Shortness of breath EXAM: PORTABLE CHEST 1 VIEW COMPARISON:  CT chest dated 12/17/2022 FINDINGS: Mild left lower lobe opacity, atelectasis versus pneumonia. Right lung is clear. No pleural effusion or pneumothorax. The heart is normal in size. Left chest power port terminates in the upper right atrium. IMPRESSION: Mild left lower lobe opacity, atelectasis versus pneumonia. Electronically Signed   By: Charline Bills M.D.   On: 01/03/2023 17:28    Pending Labs Unresulted Labs (From admission, onward)     Start     Ordered   01/04/23 0500  CBC  Tomorrow morning,   R        01/03/23 2138   01/04/23 0500  Comprehensive metabolic panel  Tomorrow morning,   R        01/03/23 2138   01/04/23 0000  Basic  metabolic panel  Now then every 6 hours,   R      01/03/23 2056   01/03/23 1833  Blood culture (routine x 2)  BLOOD CULTURE X 2,   R     Question:  Patient immune status  Answer:  Normal   01/03/23 1832            Vitals/Pain Today's Vitals   01/03/23 2129 01/03/23 2130 01/03/23 2131 01/03/23 2133  BP:    (!) 103/53  Pulse: 83 87 88 87  Resp: 16 16 17 18   Temp:    98.2 F (36.8 C)  TempSrc:    Oral  SpO2: 95% 95% 95% 95%  Weight:      Height:      PainSc:    0-No pain    Isolation Precautions No active isolations  Medications Medications  potassium chloride 10 mEq in 100 mL IVPB (10 mEq Intravenous New Bag/Given 01/03/23 2131)  0.9 %  sodium chloride infusion ( Intravenous New Bag/Given 01/03/23 2147)  mirtazapine (REMERON) tablet 45 mg (has no administration in time range)  QUEtiapine (SEROQUEL) tablet 200 mg (has no administration in time range)  sucralfate (CARAFATE) 1 GM/10ML suspension 1 g (has no administration in time range)  morphine (PF) 2 MG/ML injection 2-4 mg (has no administration in time range)  pantoprazole (PROTONIX) injection 40 mg (has no administration in time range)  enoxaparin (LOVENOX) injection 45 mg (has no administration in time range)  Ampicillin-Sulbactam (UNASYN) 3 g in sodium chloride 0.9 % 100 mL IVPB (has no administration in time range)  ondansetron (ZOFRAN) tablet 4 mg (has no administration in time range)    Or  ondansetron (ZOFRAN) injection 4 mg (has no administration in time range)  sodium chloride 0.9 % bolus 1,000 mL (0 mLs Intravenous Stopped 01/03/23 1904)  sodium chloride 0.9 %  bolus 1,000 mL (0 mLs Intravenous Stopped 01/03/23 2146)    Mobility walks with device     Focused Assessments See chart   R Recommendations: See Admitting Provider Note  Report given to:   Additional Notes: Pt is alert and oriented x 4

## 2023-01-03 NOTE — ED Provider Notes (Signed)
[place] EMERGENCY DEPARTMENT AT [facility] Provider Note   CSN: [id number] Arrival date & time: 01/03/23  1617     History  Chief Complaint  Patient presents with   Emesis   Nausea   Weakness    IMONI CHAMPOUX is a 62 y.o. female.  62 year old female with history of pancreatic cancer presents with increasing weakness for several days.  She has noted increasing emesis as well 2.  Slight abdominal discomfort.  Denies any fever.  No bloody stools.  Emesis has been dark.  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  States has been slightly short of breath.  Her dizziness is worse when she stands up.  Has not had a good appetite.  No change in her urination status.       Home Medications Prior to Admission medications   Medication Sig Start Date End Date Taking? Authorizing Provider  apixaban (ELIQUIS) 5 MG TABS tablet Take 2 tablets (10 mg total) by mouth 2 (two) times daily for 7 days, THEN 1 tablet (5 mg total) 2 (two) times daily for 21 days. 12/18/22 01/15/23  Almon Hercules, MD  APIXABAN Everlene Balls) VTE STARTER PACK (10MG  AND 5MG ) Take as directed on package: start with two-5mg  tablets twice daily for 7 days. On day 8, switch to one-5mg  tablet twice daily. 12/18/22   Almon Hercules, MD  Ensure Max Protein (ENSURE MAX PROTEIN) LIQD Take 330 mLs (11 oz total) by mouth 2 (two) times daily. 12/18/22   Almon Hercules, MD  gabapentin (NEURONTIN) 300 MG capsule Take 2 capsules (600 mg total) by mouth 2 (two) times daily. Patient taking differently: Take 900 mg by mouth at bedtime. 10/09/17   Rolan Bucco, MD  lidocaine-prilocaine (EMLA) cream Apply to affected area once 12/28/22   Malachy Mood, MD  mirtazapine (REMERON) 45 MG tablet Take 45 mg by mouth at bedtime. 11/05/21   [provider]  ondansetron (ZOFRAN) 8 MG tablet Take 1 tablet (8 mg total) by mouth every 8 (eight) hours as needed for nausea or vomiting. 12/28/22   Malachy Mood, MD   oxyCODONE (OXY IR/ROXICODONE) 5 MG immediate release tablet Take 1 tablet (5 mg total) by mouth every 4 (four) hours as needed for moderate pain (pain score 4-6). 12/27/22   Alwyn Ren, MD  pantoprazole (PROTONIX) 40 MG tablet Take 1 tablet (40 mg total) by mouth 2 (two) times daily. 12/27/22   Alwyn Ren, MD  prochlorperazine (COMPAZINE) 10 MG tablet Take 1 tablet (10 mg total) by mouth every 6 (six) hours as needed for nausea or vomiting. 12/28/22   Malachy Mood, MD  QUEtiapine (SEROQUEL) 100 MG tablet Take 200 mg by mouth at bedtime.    [provider]  sodium chloride flush (NS) 0.9 % SOLN Use 5 - 10 mLs to flush abdominal drain once daily as directed 12/18/22   Allred, Darrell K, PA-C  sucralfate (CARAFATE) 1 GM/10ML suspension Take 10 mLs (1 g total) by mouth 4 (four) times daily -  with meals and at bedtime. 12/23/22   Allwardt, Crist Infante, PA-C  Vitamin D, Ergocalciferol, (DRISDOL) 1.25 MG (50000 UNIT) CAPS capsule Take 1 capsule (50,000 Units total) by mouth every 7 (seven) days. 01/02/23   Alwyn Ren, MD      Allergies    Patient has no known allergies.    Review of Systems   Review of Systems  All other systems reviewed and  are negative.   Physical Exam Updated Vital Signs BP (!) 77/62   Pulse (!) 118   Temp 98 F (36.7 C) (Oral)   Resp (!) 21   Ht 1.6 m (5\' 3" )   Wt 45.4 kg   SpO2 (!) 77%   BMI 17.71 kg/m  Physical Exam Vitals and nursing note reviewed.  Constitutional:      Appearance: She is cachectic. She is not toxic-appearing.  HENT:     Head: Normocephalic and atraumatic.  Eyes:     General: Lids are normal.     Conjunctiva/sclera: Conjunctivae normal.     Pupils: Pupils are equal, round, and reactive to light.  Neck:     Thyroid: No thyroid mass.     Trachea: No tracheal deviation.  Cardiovascular:     Rate and Rhythm: Regular rhythm. Tachycardia present.     Heart sounds: Normal heart sounds. No murmur heard.    No  gallop.  Pulmonary:     Effort: Pulmonary effort is normal. No respiratory distress.     Breath sounds: Normal breath sounds. No stridor. No decreased breath sounds, wheezing, rhonchi or rales.  Abdominal:     General: There is distension.     Palpations: Abdomen is soft.     Tenderness: There is no abdominal tenderness. There is no rebound.  Musculoskeletal:        General: No tenderness. Normal range of motion.     Cervical back: Normal range of motion and neck supple.  Skin:    General: Skin is warm and dry.     Findings: No abrasion or rash.  Neurological:     Mental Status: She is alert and oriented to person, place, and time. Mental status is at baseline.     GCS: GCS eye subscore is 4. GCS verbal subscore is 5. GCS motor subscore is 6.     Cranial Nerves: Cranial nerves are intact. No cranial nerve deficit.     Sensory: No sensory deficit.     Motor: Motor function is intact.  Psychiatric:        Attention and Perception: Attention normal.        Speech: Speech normal.        Behavior: Behavior normal.     ED Results / Procedures / Treatments   Labs (all labs ordered are listed, but only abnormal results are displayed) Labs Reviewed  CBC - Abnormal; Notable for the following components:      Result Value   WBC 31.0 (*)    RBC 3.63 (*)    Hemoglobin 11.9 (*)    HCT 34.3 (*)    Platelets 615 (*)    All other components within normal limits  BLOOD GAS, VENOUS - Abnormal; Notable for the following components:   pH, Ven 7.58 (*)    pCO2, Ven 67 (*)    pO2, Ven <31 (*)    Bicarbonate 62.8 (*)    Acid-Base Excess 35.3 (*)    All other components within normal limits  I-STAT CG4 LACTIC ACID, ED - Abnormal; Notable for the following components:   Lactic Acid, Venous 2.8 (*)    All other components within normal limits  LIPASE, BLOOD  COMPREHENSIVE METABOLIC PANEL  URINALYSIS, ROUTINE W REFLEX MICROSCOPIC    EKG None  Radiology No results  found.  Procedures Procedures    Medications Ordered in ED Medications  sodium chloride 0.9 % bolus 1,000 mL (has no administration in time range)  sodium chloride  0.9 % bolus 1,000 mL (1,000 mLs Intravenous New Bag/Given 01/03/23 1710)    ED Course/ Medical Decision Making/ A&P                                 Medical Decision Making Amount and/or Complexity of Data Reviewed Labs: ordered. Radiology: ordered.  Risk Prescription drug management.   Patient hypotensive.  Given IV fluids.  Blood pressure is greatly in proved.  Severe electrolyte abnormalities noted with patient having hyponatremia.  Given IV saline for this.  Portable chest x-ray concerning for pneumonia.  Patient started on IV antibiotics.  Lactate initially elevated 2.8.  Repeat normalized.  Abdominal CT with possible partial gastric L obstruction.  This is a finding she has had before in the past.  Family states that there has been some discussion about possible stent placement for this.  Patient also severely hypokalemic and given IV potassium runs.  Plan will be for admission at this time.  Plan discussed with family.  CRITICAL CARE Performed by: Toy Baker Total critical care time: 60 minutes Critical care time was exclusive of separately billable procedures and treating other patients. Critical care was necessary to treat or prevent imminent or life-threatening deterioration. Critical care was time spent personally by me on the following activities: development of treatment plan with patient and/or surrogate as well as nursing, discussions with consultants, evaluation of patient's response to treatment, examination of patient, obtaining history from patient or surrogate, ordering and performing treatments and interventions, ordering and review of laboratory studies, ordering and review of radiographic studies, pulse oximetry and re-evaluation of patient's condition.         Final Clinical Impression(s)  / ED Diagnoses Final diagnoses:  None    Rx / DC Orders ED Discharge Orders     None         Lorre Nick, MD 01/03/23 2106

## 2023-01-03 NOTE — Assessment & Plan Note (Addendum)
Pt with at least partial gastric outlet obstruction due to combination of pancreatic CA + duodenal ulcer. NPO Switching eliquis to LMWH given NPO status Switching PPI to IV Cont Carafate IVF as discussed under hyponatremia Message sent to [facility] for AM consult.  Looks like they were discussing duodenal stent as of last admit.

## 2023-01-03 NOTE — Assessment & Plan Note (Addendum)
Pt with RLL PNA vs IAP as source with WBC 31k + tachycardia. Got rocephin + azithro in ED Will put pt on unasyn for the moment to cover aspiration PNA and IAI Check BCx

## 2023-01-03 NOTE — Assessment & Plan Note (Signed)
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

## 2023-01-03 NOTE — H&P (Signed)
History and Physical    Patient: Kathryn Underwood PIR:[id number] DOB: 1960-07-13 DOA: 01/03/2023 DOS: the patient was seen and examined on 01/03/2023 PCP: Allwardt, Crist Infante, PA-C  Patient coming from: Home  Chief Complaint:  Chief Complaint  Patient presents with   Emesis   Nausea   Weakness   HPI: Kathryn Underwood is a 62 y.o. female with medical history significant of alcoholism, pancreatitis with pseudocyst, anxiety, depression, neuropathy, and recent diagnosis of pancreatic adenocarcinoma.  Pt on eliquis for portal vein thrombosis.  Pt recently admitted 11/27-12/1 with SIRS, duodenal ulcer, and partial gastric outlet obstruction.  Ultimately no source for SIRS found after 3 days on ABx.  Some discussion of duodenal stent but ultimately pt tolerating full liquids and discharged.  Pt now back in to ED with increasing generalized weakness for past couple of days.  Slight abd discomfort, no fevers.  Has had increased N/V over past few days.  Slight SOB.  Dizziness on standing.  Found to be pre-renal and have sodium of 119 down from 136 on 12/2  CT shows gastric outlet obstruction, also RLL PNA findings.  WBC 31k up from 11.4 on discharge.   Review of Systems: As mentioned in the history of present illness. All other systems reviewed and are negative. Past Medical History:  Diagnosis Date   Alcohol abuse    Anxiety    Chickenpox    Depression    Neuromuscular disorder (HCC)    neuropathy   Pancreatic cancer (HCC)    Substance abuse [facility])    Past Surgical History:  Procedure Laterality Date   BIOPSY  12/10/2022   Procedure: BIOPSY;  Surgeon: Meridee Score, Netty Starring., MD;  Location: [facility];  Service: Gastroenterology;;   CESAREAN SECTION     ESOPHAGOGASTRODUODENOSCOPY N/A 08/14/2021   Procedure: ESOPHAGOGASTRODUODENOSCOPY (EGD);  Surgeon: Rachael Fee, MD;  Location: [facility];  Service: Gastroenterology;  Laterality: N/A;   ESOPHAGOGASTRODUODENOSCOPY N/A  12/12/2022   Procedure: ESOPHAGOGASTRODUODENOSCOPY (EGD);  Surgeon: Lemar Lofty., MD;  Location: [facility];  Service: Gastroenterology;  Laterality: N/A;   ESOPHAGOGASTRODUODENOSCOPY (EGD) WITH PROPOFOL N/A 12/10/2022   Procedure: ESOPHAGOGASTRODUODENOSCOPY (EGD) WITH PROPOFOL;  Surgeon: Meridee Score Netty Starring., MD;  Location: [facility];  Service: Gastroenterology;  Laterality: N/A;   EUS N/A 08/14/2021   Procedure: UPPER ENDOSCOPIC ULTRASOUND (EUS) RADIAL;  Surgeon: Rachael Fee, MD;  Location: [facility];  Service: Gastroenterology;  Laterality: N/A;   EUS N/A 12/12/2022   Procedure: UPPER ENDOSCOPIC ULTRASOUND (EUS) LINEAR;  Surgeon: Lemar Lofty., MD;  Location: [facility];  Service: Gastroenterology;  Laterality: N/A;   FINE NEEDLE ASPIRATION N/A 08/14/2021   Procedure: FINE NEEDLE ASPIRATION (FNA) LINEAR;  Surgeon: Rachael Fee, MD;  Location: [facility];  Service: Gastroenterology;  Laterality: N/A;   FINE NEEDLE ASPIRATION N/A 12/12/2022   Procedure: FINE NEEDLE ASPIRATION (FNA) LINEAR;  Surgeon: Lemar Lofty., MD;  Location: [facility];  Service: Gastroenterology;  Laterality: N/A;   IR IMAGING GUIDED PORT INSERTION  12/18/2022   IR PERC CHOLECYSTOSTOMY  12/13/2022   TONSILLECTOMY     Social History:  reports that she has been smoking cigarettes. She has never used smokeless tobacco. She reports that she does not currently use alcohol after a past usage of about 16.0 standard drinks of alcohol per week. She reports that she does not currently use drugs after having used the following drugs: Cocaine and Marijuana.  No Known Allergies  Family History  Problem Relation Age of Onset  Dementia Mother    Arthritis Father    Asthma Father    Asthma Brother    Diabetes Maternal Grandmother    Dementia Maternal Grandmother    Diabetes Paternal Grandmother    Parkinson's disease Maternal Grandfather    Parkinson's disease Paternal  Grandfather    Breast cancer Neg Hx    Colon cancer Neg Hx     Prior to Admission medications   Medication Sig Start Date End Date Taking? Authorizing Provider  apixaban (ELIQUIS) 5 MG TABS tablet Take 2 tablets (10 mg total) by mouth 2 (two) times daily for 7 days, THEN 1 tablet (5 mg total) 2 (two) times daily for 21 days. 12/18/22 01/15/23  Almon Hercules, MD  APIXABAN Everlene Balls) VTE STARTER PACK (10MG  AND 5MG ) Take as directed on package: start with two-5mg  tablets twice daily for 7 days. On day 8, switch to one-5mg  tablet twice daily. 12/18/22   Almon Hercules, MD  Ensure Max Protein (ENSURE MAX PROTEIN) LIQD Take 330 mLs (11 oz total) by mouth 2 (two) times daily. 12/18/22   Almon Hercules, MD  gabapentin (NEURONTIN) 300 MG capsule Take 2 capsules (600 mg total) by mouth 2 (two) times daily. Patient taking differently: Take 900 mg by mouth at bedtime. 10/09/17   Rolan Bucco, MD  lidocaine-prilocaine (EMLA) cream Apply to affected area once 12/28/22   Malachy Mood, MD  mirtazapine (REMERON) 45 MG tablet Take 45 mg by mouth at bedtime. 11/05/21   [provider]  ondansetron (ZOFRAN) 8 MG tablet Take 1 tablet (8 mg total) by mouth every 8 (eight) hours as needed for nausea or vomiting. 12/28/22   Malachy Mood, MD  oxyCODONE (OXY IR/ROXICODONE) 5 MG immediate release tablet Take 1 tablet (5 mg total) by mouth every 4 (four) hours as needed for moderate pain (pain score 4-6). 12/27/22   Alwyn Ren, MD  pantoprazole (PROTONIX) 40 MG tablet Take 1 tablet (40 mg total) by mouth 2 (two) times daily. 12/27/22   Alwyn Ren, MD  prochlorperazine (COMPAZINE) 10 MG tablet Take 1 tablet (10 mg total) by mouth every 6 (six) hours as needed for nausea or vomiting. 12/28/22   Malachy Mood, MD  QUEtiapine (SEROQUEL) 100 MG tablet Take 200 mg by mouth at bedtime.    [provider]  sodium chloride flush (NS) 0.9 % SOLN Use 5 - 10 mLs to flush abdominal drain once daily as directed  12/18/22   Allred, Darrell K, PA-C  sucralfate (CARAFATE) 1 GM/10ML suspension Take 10 mLs (1 g total) by mouth 4 (four) times daily -  with meals and at bedtime. 12/23/22   Allwardt, Crist Infante, PA-C  Vitamin D, Ergocalciferol, (DRISDOL) 1.25 MG (50000 UNIT) CAPS capsule Take 1 capsule (50,000 Units total) by mouth every 7 (seven) days. 01/02/23   Alwyn Ren, MD    Physical Exam: Vitals:   01/03/23 2130 01/03/23 2131 01/03/23 2133 01/03/23 2254  BP:   (!) 103/53 108/67  Pulse: 87 88 87 95  Resp: 16 17 18 16   Temp:   98.2 F (36.8 C) 97.9 F (36.6 C)  TempSrc:   Oral Oral  SpO2: 95% 95% 95% 91%  Weight:      Height:       Constitutional: Cachectic Respiratory: clear to auscultation bilaterally, no wheezing, no crackles. Normal respiratory effort. No accessory muscle use.  Cardiovascular: Tachycardic  Abdomen: Mild TTP Neurologic: CN 2-12 grossly intact. Sensation intact, DTR normal. Strength 5/5 in  all 4.  Psychiatric: Normal judgment and insight. Alert and oriented x 3. Normal mood.   Data Reviewed: {Tip this will not be part of the note when signed- Document your independent interpretation of telemetry tracing, EKG, lab, Radiology test or any other diagnostic tests. Add any new diagnostic test ordered today. (Optional):[id number]}   Labs on Admission: I have personally reviewed following labs and imaging studies  CBC: Recent Labs  Lab 12/28/22 1107 01/03/23 1702  WBC 11.4* 31.0*  NEUTROABS 8.3*  --   HGB 13.6 11.9*  HCT 39.0 34.3*  MCV 96.3 94.5  PLT 705* 615*   Basic Metabolic Panel: Recent Labs  Lab 12/28/22 1107 01/03/23 1850 01/03/23 2302  NA 136 119* 122*  K 4.3 2.6* 2.8*  CL 92* 65* 73*  CO2 36* 38* 38*  GLUCOSE 113* 147* 145*  BUN 8 57* 55*  CREATININE 0.48 0.82 0.68  CALCIUM 10.3 8.1* 8.2*   GFR: Estimated Creatinine Clearance: 51.6 mL/min (by C-G formula based on SCr of 0.68 mg/dL). Liver Function Tests: Recent Labs  Lab 12/28/22 1107  01/03/23 1850  AST 23 17  ALT 16 10  ALKPHOS 758* 139*  BILITOT 1.3* 0.4  PROT 6.7 5.4*  ALBUMIN 3.7 2.6*   Recent Labs  Lab 01/03/23 1850  LIPASE 43   No results for input(s): "AMMONIA" in the last 168 hours. Coagulation Profile: No results for input(s): "INR", "PROTIME" in the last 168 hours. Cardiac Enzymes: No results for input(s): "CKTOTAL", "CKMB", "CKMBINDEX", "TROPONINI" in the last 168 hours. BNP (last 3 results) No results for input(s): "PROBNP" in the last 8760 hours. HbA1C: No results for input(s): "HGBA1C" in the last 72 hours. CBG: No results for input(s): "GLUCAP" in the last 168 hours. Lipid Profile: No results for input(s): "CHOL", "HDL", "LDLCALC", "TRIG", "CHOLHDL", "LDLDIRECT" in the last 72 hours. Thyroid Function Tests: No results for input(s): "TSH", "T4TOTAL", "FREET4", "T3FREE", "THYROIDAB" in the last 72 hours. Anemia Panel: No results for input(s): "VITAMINB12", "FOLATE", "FERRITIN", "TIBC", "IRON", "RETICCTPCT" in the last 72 hours. Urine analysis:    Component Value Date/Time   COLORURINE YELLOW 01/03/2023 2133   APPEARANCEUR CLEAR 01/03/2023 2133   LABSPEC 1.015 01/03/2023 2133   PHURINE 5.0 01/03/2023 2133   GLUCOSEU NEGATIVE 01/03/2023 2133   HGBUR NEGATIVE 01/03/2023 2133   BILIRUBINUR NEGATIVE 01/03/2023 2133   KETONESUR NEGATIVE 01/03/2023 2133   PROTEINUR NEGATIVE 01/03/2023 2133   UROBILINOGEN 0.2 01/23/2010 1750   NITRITE NEGATIVE 01/03/2023 2133   LEUKOCYTESUR NEGATIVE 01/03/2023 2133    Radiological Exams on Admission: CT ABDOMEN PELVIS WO CONTRAST  Result Date: 01/03/2023 CLINICAL DATA:  Abdominal pain. Nausea vomiting for several days. Reported history of pancreatic carcinoma. EXAM: CT ABDOMEN AND PELVIS WITHOUT CONTRAST TECHNIQUE: Multidetector CT imaging of the abdomen and pelvis was performed following the standard protocol without IV contrast. RADIATION DOSE REDUCTION: This exam was performed according to the  departmental dose-optimization program which includes automated exposure control, adjustment of the mA and/or kV according to patient size and/or use of iterative reconstruction technique. COMPARISON:  12/23/2022. FINDINGS: Lower chest: Ill-defined peribronchovascular ground-glass and more confluent opacities, most evident in the left lower lobe, new since the prior CT. No pleural effusion. Small pericardial effusion similar to the prior CT. Fluid mildly distends the distal esophagus. Hepatobiliary: Ill-defined hypoattenuation involving segments 4 and 5 as well as portions of segment 6, better defined on the prior contrast enhanced CT, consistent with metastatic disease. There also several more well-defined hypoattenuating liver masses there are  stable. No new liver abnormalities. Pigtail drainage catheter lies along the inferior margin of the right lobe, unchanged. Pancreas: Heterogeneous mass with ill-defined margins arises from the pancreatic head with coarse central calcifications. This is unchanged from the prior CT. Fluid collection abuts the right inferior pancreatic head and adjacent duodenum measuring 3.7 cm. Larger fluid collection abuts the proximal pancreatic tail, posterior to the stomach, 5.8 cm in size. These are similar to the prior CT suspected to be pseudocysts. There is some fat stranding adjacent to the pancreas, similar to the prior CT. Spleen: Normal. Adrenals/Urinary Tract: Mild bilateral adrenal gland thickening consistent with hyperplasia. Normal kidneys, ureters and bladder. Stomach/Bowel: Significant gastric distension. No stomach wall thickening. Duodenum broadly abuts the pancreatic head mass. Wall not defined separate from the mass on this unenhanced exam. Small bowel and colon are normal in caliber. No wall thickening or inflammation. Mild to moderate increase in the colonic stool burden similar to the prior CT. Vascular/Lymphatic: Aortic atherosclerotic calcifications. No defined  enlarged lymph nodes. Reproductive: Partly calcified uterine fibroids.  No adnexal masses. Other: No ascites. Musculoskeletal: No fracture or acute finding.  No bone lesion. IMPRESSION: 1. Significant stomach distention. This is suspected to be due to partial gastric outlet obstruction at the level of the duodenum due to the large pancreatic mass. 2. Pancreatic mass, liver lesions and right upper quadrant drainage catheter are without significant change from the recent prior CT, better defined on the prior study due to the presence of intravenous contrast. 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 These are also unchanged from the recent prior CT. Electronically Signed   By: Amie Portland M.D.   On: 01/03/2023 19:09   DG Chest Portable 1 View  Result Date: 01/03/2023 CLINICAL DATA:  Shortness of breath EXAM: PORTABLE CHEST 1 VIEW COMPARISON:  CT chest dated 12/17/2022 FINDINGS: Mild left lower lobe opacity, atelectasis versus pneumonia. Right lung is clear. No pleural effusion or pneumothorax. The heart is normal in size. Left chest power port terminates in the upper right atrium. IMPRESSION: Mild left lower lobe opacity, atelectasis versus pneumonia. Electronically Signed   By: Charline Bills M.D.   On: 01/03/2023 17:28    EKG: Independently reviewed.   Assessment and Plan: * Hyponatremia Pt with hypovolemic hyponatremia due to decreased PO intake, increased N/V from gastric outlet obstruction. IVF: NS 2L bolus and 75cc /hr for the moment BMP Q6h to monitor sodium  SIRS (systemic inflammatory response syndrome) (HCC) Pt with RLL PNA vs IAP as source with WBC 31k + tachycardia. Got rocephin + azithro in ED Will put pt on unasyn for the moment to cover aspiration PNA and IAI Check BCx  Pancreatic adenocarcinoma [facility]) Not yet able to start chemo or radiation.  Partial gastric outlet obstruction Pt with at least partial gastric outlet obstruction  due to combination of pancreatic CA + duodenal ulcer. NPO Switching eliquis to LMWH given NPO status Switching PPI to IV Cont Carafate IVF as discussed under hyponatremia Message sent to [facility] for AM consult.  Looks like they were discussing duodenal stent as of last admit.  Protein-calorie malnutrition, severe RD consult, I suspect she's gonna need to go on TPN.  Pancreatic pseudocyst Pseudocysts as described on CT      Advance Care Planning:   Code Status: Full Code  Consults: Message sent to [facility]  Family Communication: ***  Severity of Illness: The appropriate patient status for this patient is INPATIENT. Inpatient status is judged to be  reasonable and necessary in order to provide the required intensity of service to ensure the patient's safety. The patient's presenting symptoms, physical exam findings, and initial radiographic and laboratory data in the context of their chronic comorbidities is felt to place them at high risk for further clinical deterioration. Furthermore, it is not anticipated that the patient will be medically stable for discharge from the hospital within 2 midnights of admission.   * I certify that at the point of admission it is my clinical judgment that the patient will require inpatient hospital care spanning beyond 2 midnights from the point of admission due to high intensity of service, high risk for further deterioration and high frequency of surveillance required.*  Author: Hillary Bow., DO 01/03/2023 11:46 PM  For on call review [email or web address].

## 2023-01-03 NOTE — Assessment & Plan Note (Addendum)
Pt with hypovolemic hyponatremia due to decreased PO intake, increased N/V from gastric outlet obstruction. IVF: NS 2L bolus and 75cc /hr for the moment BMP Q6h to monitor sodium

## 2023-01-04 ENCOUNTER — Encounter (HOSPITAL_COMMUNITY): Payer: Self-pay | Admitting: Internal Medicine

## 2023-01-04 ENCOUNTER — Other Ambulatory Visit: Payer: Self-pay

## 2023-01-04 ENCOUNTER — Inpatient Hospital Stay (HOSPITAL_COMMUNITY): Payer: Medicaid Other

## 2023-01-04 ENCOUNTER — Other Ambulatory Visit: Payer: Medicaid Other | Admitting: Pharmacist

## 2023-01-04 DIAGNOSIS — K315 Obstruction of duodenum: Secondary | ICD-10-CM

## 2023-01-04 DIAGNOSIS — R6251 Failure to thrive (child): Secondary | ICD-10-CM

## 2023-01-04 DIAGNOSIS — D72829 Elevated white blood cell count, unspecified: Secondary | ICD-10-CM

## 2023-01-04 DIAGNOSIS — E871 Hypo-osmolality and hyponatremia: Secondary | ICD-10-CM | POA: Diagnosis not present

## 2023-01-04 DIAGNOSIS — I81 Portal vein thrombosis: Secondary | ICD-10-CM

## 2023-01-04 DIAGNOSIS — E876 Hypokalemia: Secondary | ICD-10-CM

## 2023-01-04 DIAGNOSIS — R634 Abnormal weight loss: Secondary | ICD-10-CM

## 2023-01-04 DIAGNOSIS — C259 Malignant neoplasm of pancreas, unspecified: Secondary | ICD-10-CM

## 2023-01-04 DIAGNOSIS — K311 Adult hypertrophic pyloric stenosis: Secondary | ICD-10-CM

## 2023-01-04 DIAGNOSIS — C258 Malignant neoplasm of overlapping sites of pancreas: Secondary | ICD-10-CM

## 2023-01-04 LAB — BASIC METABOLIC PANEL
Anion gap: 9 (ref 5–15)
Anion gap: 10 (ref 5–15)
BUN: 35 mg/dL — ABNORMAL HIGH (ref 8–23)
BUN: 30 mg/dL — ABNORMAL HIGH (ref 8–23)
CO2: 36 mmol/L — ABNORMAL HIGH (ref 22–32)
CO2: 34 mmol/L — ABNORMAL HIGH (ref 22–32)
Calcium: 8.2 mg/dL — ABNORMAL LOW (ref 8.9–10.3)
Calcium: 8 mg/dL — ABNORMAL LOW (ref 8.9–10.3)
Chloride: 83 mmol/L — ABNORMAL LOW (ref 98–111)
Chloride: 84 mmol/L — ABNORMAL LOW (ref 98–111)
Creatinine, Ser: 0.48 mg/dL (ref 0.44–1.00)
Creatinine, Ser: 0.33 mg/dL — ABNORMAL LOW (ref 0.44–1.00)
GFR, Estimated: >60 mL/min (ref >60)
GFR, Estimated: >60 mL/min (ref >60)
Glucose, Bld: 111 mg/dL — ABNORMAL HIGH (ref 70–99)
Glucose, Bld: 113 mg/dL — ABNORMAL HIGH (ref 70–99)
Potassium: 2.9 mmol/L — ABNORMAL LOW (ref 3.5–5.1)
Potassium: 2.7 mmol/L — CL (ref 3.5–5.1)
Sodium: 128 mmol/L — ABNORMAL LOW (ref 135–145)
Sodium: 128 mmol/L — ABNORMAL LOW (ref 135–145)

## 2023-01-04 LAB — COMPREHENSIVE METABOLIC PANEL
ALT: 9 U/L (ref 0–44)
AST: 14 U/L — ABNORMAL LOW (ref 15–41)
Albumin: 2.3 g/dL — ABNORMAL LOW (ref 3.5–5.0)
Alkaline Phosphatase: 112 U/L (ref 38–126)
Anion gap: 9 (ref 5–15)
BUN: 43 mg/dL — ABNORMAL HIGH (ref 8–23)
CO2: 36 mmol/L — ABNORMAL HIGH (ref 22–32)
Calcium: 8.1 mg/dL — ABNORMAL LOW (ref 8.9–10.3)
Chloride: 79 mmol/L — ABNORMAL LOW (ref 98–111)
Creatinine, Ser: 0.55 mg/dL (ref 0.44–1.00)
GFR, Estimated: >60 mL/min (ref >60)
Glucose, Bld: 110 mg/dL — ABNORMAL HIGH (ref 70–99)
Potassium: 3.1 mmol/L — ABNORMAL LOW (ref 3.5–5.1)
Sodium: 124 mmol/L — ABNORMAL LOW (ref 135–145)
Total Bilirubin: 0.5 mg/dL (ref <1.2)
Total Protein: 5 g/dL — ABNORMAL LOW (ref 6.5–8.1)

## 2023-01-04 LAB — CBC
HCT: 26.7 % — ABNORMAL LOW (ref 36.0–46.0)
Hemoglobin: 9.3 g/dL — ABNORMAL LOW (ref 12.0–15.0)
MCH: 33.2 pg (ref 26.0–34.0)
MCHC: 34.8 g/dL (ref 30.0–36.0)
MCV: 95.4 fL (ref 80.0–100.0)
Platelets: 468 10*3/uL — ABNORMAL HIGH (ref 150–400)
RBC: 2.8 MIL/uL — ABNORMAL LOW (ref 3.87–5.11)
RDW: 13.3 % (ref 11.5–15.5)
WBC: 32.3 10*3/uL — ABNORMAL HIGH (ref 4.0–10.5)
nRBC: 0 % (ref 0.0–0.2)

## 2023-01-04 LAB — AMMONIA: Ammonia: 27 umol/L (ref 9–35)

## 2023-01-04 LAB — MAGNESIUM: Magnesium: 2.3 mg/dL (ref 1.7–2.4)

## 2023-01-04 MED ORDER — THIAMINE HCL 100 MG/ML IJ SOLN: 100.0000 mg | Freq: Every day | INTRAMUSCULAR | Status: DC

## 2023-01-04 MED ORDER — KCL IN DEXTROSE-NACL 20-5-0.9 MEQ/L-%-% IV SOLN
INTRAVENOUS | Status: AC
  Filled 2023-01-04: qty 1000

## 2023-01-04 MED ORDER — THIAMINE HCL 100 MG/ML IJ SOLN
100.0000 mg | Freq: Every day | INTRAMUSCULAR | Status: DC
  Administered 2023-01-04 – 2023-01-12 (×9): 100 mg via INTRAVENOUS
  Filled 2023-01-04 (×9): qty 2

## 2023-01-04 MED ORDER — POTASSIUM CHLORIDE 10 MEQ/100ML IV SOLN
10.0000 meq | INTRAVENOUS | Status: AC
  Administered 2023-01-04 – 2023-01-05 (×5): 10 meq via INTRAVENOUS
  Filled 2023-01-04 (×5): qty 100

## 2023-01-04 MED ORDER — LORAZEPAM 2 MG/ML IJ SOLN
0.5000 mg | INTRAMUSCULAR | Status: DC | PRN
  Administered 2023-01-04 – 2023-01-08 (×10): 0.5 mg via INTRAVENOUS
  Filled 2023-01-04 (×10): qty 1

## 2023-01-04 MED ORDER — POTASSIUM CHLORIDE 10 MEQ/100ML IV SOLN: 10.0000 meq | INTRAVENOUS | Status: DC

## 2023-01-04 MED ORDER — POTASSIUM CHLORIDE 10 MEQ/100ML IV SOLN
10.0000 meq | INTRAVENOUS | Status: AC
  Administered 2023-01-04 (×2): 10 meq via INTRAVENOUS

## 2023-01-04 MED ORDER — ENOXAPARIN SODIUM 60 MG/0.6ML IJ SOSY
45.0000 mg | PREFILLED_SYRINGE | Freq: Two times a day (BID) | INTRAMUSCULAR | Status: AC
  Administered 2023-01-04 (×2): 45 mg via SUBCUTANEOUS
  Filled 2023-01-04 (×2): qty 0.6

## 2023-01-04 NOTE — Progress Notes (Signed)
Initial Nutrition Assessment  DOCUMENTATION CODES:   Severe malnutrition in context of chronic illness  INTERVENTION:  - NPO per MD.   - Will monitor for diet advancement. If unable to advance diet in the next 1-2 days, would recommend consideration of TPN if medically appropriate.   NUTRITION DIAGNOSIS:   Severe Malnutrition related to chronic illness (pancreatic adenocarcinoma) as evidenced by severe fat depletion, severe muscle depletion, energy intake < 75% for > or equal to 1 month.  GOAL:   Patient will meet greater than or equal to 90% of their needs  MONITOR:   Diet advancement, Labs, Weight trends  REASON FOR ASSESSMENT:   Consult New TPN/TNA ("Gastric outlet obstruction due to pancreatic CA, cachexia.  I suspect she's gonna need TPN.")  ASSESSMENT:   62 y.o. female with history of previous EtOH induced pancreatitis, prior history of substance abuse, anxiety and depression who was very recently diagnosed with pancreatic adenocarcinoma during admission in November 2024 who presented with N/V and weakness. Admitted for AKI, severe hyponatremia, and underlying gastric outlet obstruction with pancreatic malignancy and pseudocysts.   Met with patient and partner at bedside. UBW reported to be 125-130# and patient reports last weighing this 1-2 months ago. Notes she noticed she was losing weight ~1 month ago but states that by then she had already been losing some.  Per EMR, only weight within the past year is from 1 month ago on 11/12, when patient was weighed at 110#. She lost to 100# by 11/27, which is a 10# or 9% weight loss in 2 weeks. This is severe and significant for the time frame.  Patient shares she has not been eating normally for 6 months. Over the past 3-4 months she has mostly been eating liquids or soft foods such as pasta. Drinking Ensure during recent admission but was not drinking prior to that.   Over the past 2 weeks, she has only been able to get down  full liquids. However, partner notes that she has been vomiting everything she eats/drinks back up. Also reports that the materials vomited usually looked undigested and that she would vomit more in the evening, after trying to eat all day. Appetite has been good she just hasn't been able to keep anything down.  Patient understanding frustrated during visit. She remains NPO at this time.  GI following and plan for EGD and duodenal stent placement tomorrow.  GI recommending NGT which was ordered by Hospitalist however no tube yet placed.   As patient has had ~6 months of inadequate intake and almost 2 weeks of minimal/no intake, recommend aggressive nutrition interventions if within goals of care. If unable to advance diet in the next 1-2 days, would recommend consideration of TPN.   Medications reviewed and include: Remeron, Carafate, 100mg  thiamine., D5 @ 63mL/hr (provides 163 kcals over 24 hours)  Labs reviewed: Na 124 K+ 3.1   NUTRITION - FOCUSED PHYSICAL EXAM:  Flowsheet Row Most Recent Value  Orbital Region Mild depletion  Upper Arm Region Severe depletion  Thoracic and Lumbar Region Severe depletion  Buccal Region Severe depletion  Temple Region Severe depletion  Clavicle Bone Region Severe depletion  Clavicle and Acromion Bone Region Severe depletion  Scapular Bone Region Unable to assess  Dorsal Hand Severe depletion  Patellar Region Severe depletion  Anterior Thigh Region Severe depletion  Posterior Calf Region Severe depletion  Edema (RD Assessment) None  Hair Reviewed  Eyes Reviewed  Mouth Reviewed  Skin Reviewed  Nails Reviewed  Diet Order:   Diet Order             Diet NPO time specified  Diet effective 0500           Diet NPO time specified Except for: Sips with Meds, Ice Chips  Diet effective now                   EDUCATION NEEDS:  Education needs have been addressed  Skin:  Skin Assessment: Reviewed RN Assessment  Last BM:   12/8  Height:  Ht Readings from Last 1 Encounters:  01/03/23 5\' 3"  (1.6 m)   Weight:  Wt Readings from Last 1 Encounters:  01/03/23 45.4 kg   BMI:  Body mass index is 17.71 kg/m.  Estimated Nutritional Needs:  Kcal:  1800-2000 kcals Protein:  80-90 grams Fluid:  >/= 1.8L    Shelle Iron RD, LDN Contact via Secure Chat.

## 2023-01-04 NOTE — TOC CM/SW Note (Signed)
Transition of Care [facility]) - Inpatient Brief Assessment   Patient Details  Name: Kathryn Underwood MRN: [id number] Date of Birth: May 07, 1960  [facility] CM/SW Contact:    Larrie Kass, LCSW Phone Number: 01/04/2023, 10:47 AM   Clinical Narrative:   Transition of Care Department [facility]) has reviewed patient and no TOC needs have been identified at this time. We will continue to monitor patient advancement through interdisciplinary progression rounds. If new patient transition needs arise, please place a TOC consult.    Transition of Care Asessment: Insurance and Status: Insurance coverage has been reviewed Patient has primary care physician: Yes Home environment has been reviewed: home Prior level of function:: independent Prior/Current Home Services: No current home services Social Determinants of Health Reivew: SDOH reviewed no interventions necessary Readmission risk has been reviewed: Yes Transition of care needs: no transition of care needs at this time

## 2023-01-04 NOTE — Progress Notes (Signed)
HOSPITALIST ROUNDING NOTE LATRINIA BASHOR GNF:[id number]  DOB: 1960/02/14  DOA: 01/03/2023  PCP: Allwardt, Alyssa M, PA-C  01/04/2023,7:59 AM   LOS: 1 day      Code Status: Full code From: Home  current Dispo: Unclear     62 year old home dwelling white female  pancreatic adenocarcinoma cT3 N0 M0 diagnosed 11/2022 in the setting of prior pancreatitis with pseudocyst EtOH Bipolar Hospitalization 11/12--11/22 fatigue intentional weight loss imaging = pancreatic neoplasm-EGD EUS 11/16 caused abdominal pain CT ABD GU 00 secondary to infiltrating pancreatic mass + ulcer-GB was distended-IR performed [facility] cholecystotomy 11/17-she was discharged on full liquid diet to follow-up in the outpatient setting and Port-A-Cath was placed   readmit 11/27 through 12/27/2022 epigastric pain nausea?  SIRS--treated empirically IV antibiotic recs no source obtained felt to be secondary to her esophagitis  discussion regarding nab-paclitaxel gemcitabine on 12/2 after outpatient follow-up Dr. Mosetta Putt was given IV fluids additionally  12/8 readmit N/V/weakness, increasing emesis with dark color Sodium 122 potassium 2.8 BUN/creatinine 55/0.6 ( 8/0.4), bicarb 38 WBC 31 platelet 615 hemoglobin 11.9 CXR?  LLL opacity atelectasis PNA CT ABD pelvis-significant stomach distention 2/2 partial gastric outlet obstruction level of duodenum, pancreatic mass liver lesions RUQ without significant change primary pancreatic lesions suspected to be pseudocysts unchanged  Plan  ATN/AKI secondary to nausea vomiting Continue saline 75 cc/H-add potassium to fluids-hopeful for improvement over next 24 hours  Severe hyponatremia, hypokalemia, met alkalosis  runs of K  6 rounds earlier --K to fluids as above--check magnesium and replace as able Alkalosis 2/2 loss stomach acid  Leukocytosis?  Pneumonia Started on Unasyn-monitor trends fever chills WBC Fluids as above  Underlying gastric outlet obstruction with pancreatic malignancy and  pseudocysts Difficult situation-might need NG tube --discussed with patient .  amenable Keep n.p.o--replace electrolytes with fluids  Requires possible duodenal stent vs other-defer to GI  may use morphine 2-4 every 4 as needed severe pain  Occluded splenic superior mesenteric vein/portal confluence Previously on anticoagulation with apixaban, continues at this time on Lovenox 45 twice daily-hold for procedure  Cholecystotomy previously 11/22 As outpatient follow-up with IR on 01/29/2023-still putting out > 25 cc/d--so would wait for drain study  Previous EtOH Bipolar Continue Remeron 45 and Seroquel 200 at bedtime Because of difficulty coping and anxiety have placed on Ativan 0.5 every 4 as needed for 6 doses  Previous COPD Not on home meds  Cancer related cachexia BMI 18 nutrition as able  DVT prophylaxis: lovenox  Status is: Inpatient Remains inpatient appropriate because:   Requires GI input/procedure      Subjective: Anxious.  No vomit.  Some flatus-no cp, fever   Objective + exam Vitals:   01/03/23 2131 01/03/23 2133 01/03/23 2254 01/04/23 0341  BP:  (!) 103/53 108/67 (!) 93/47  Pulse: 88 87 95 94  Resp: 17 18 16 14   Temp:  98.2 F (36.8 C) 97.9 F (36.6 C) 97.9 F (36.6 C)  TempSrc:  Oral Oral Oral  SpO2: 95% 95% 91% 90%  Weight:      Height:       Filed Weights   01/03/23 1628  Weight: 45.4 kg    Examination:  Cacehctic, anxious wf S1 s2 no m--tachycardic--port in place Abd soft, non distended, no rebound--drain reviewed in place--draining green liquid> 15 ml,  Power 5/5   Data Reviewed: reviewed   CBC    Component Value Date/Time   WBC 31.0 (H) 01/03/2023 1702   RBC 3.63 (L) 01/03/2023 1702   HGB  11.9 (L) 01/03/2023 1702   HGB 13.6 12/28/2022 1107   HCT 34.3 (L) 01/03/2023 1702   PLT 615 (H) 01/03/2023 1702   PLT 705 (H) 12/28/2022 1107   MCV 94.5 01/03/2023 1702   MCH 32.8 01/03/2023 1702   MCHC 34.7 01/03/2023 1702   RDW 13.2  01/03/2023 1702   LYMPHSABS 1.8 12/28/2022 1107   MONOABS 0.9 12/28/2022 1107   EOSABS 0.3 12/28/2022 1107   BASOSABS 0.1 12/28/2022 1107      Latest Ref Rng & Units 01/04/2023    6:00 AM 01/03/2023   11:02 PM 01/03/2023    6:50 PM  CMP  Glucose 70 - 99 mg/dL 696  295  284   BUN 8 - 23 mg/dL 43  55  57   Creatinine 0.44 - 1.00 mg/dL 1.32  4.40  1.02   Sodium 135 - 145 mmol/L 124  122  119   Potassium 3.5 - 5.1 mmol/L 3.1  2.8  2.6   Chloride 98 - 111 mmol/L 79  73  65   CO2 22 - 32 mmol/L 36  38  38   Calcium 8.9 - 10.3 mg/dL 8.1  8.2  8.1   Total Protein 6.5 - 8.1 g/dL 5.0   5.4   Total Bilirubin <1.2 mg/dL 0.5   0.4   Alkaline Phos 38 - 126 U/L 112   139   AST 15 - 41 U/L 14   17   ALT 0 - 44 U/L 9   10      Scheduled Meds:  Chlorhexidine Gluconate Cloth  6 each Topical Daily   enoxaparin (LOVENOX) injection  45 mg Subcutaneous BID   mirtazapine  45 mg Oral QHS   pantoprazole (PROTONIX) IV  40 mg Intravenous Q12H   QUEtiapine  200 mg Oral QHS   sucralfate  1 g Oral TID WC & HS   thiamine (VITAMIN B1) injection  100 mg Intravenous Daily   Continuous Infusions:  sodium chloride 75 mL/hr at 01/03/23 2307   ampicillin-sulbactam (UNASYN) IV 3 g (01/04/23 0519)    Time  50  Rhetta Mura, MD  [facility]

## 2023-01-04 NOTE — Progress Notes (Addendum)
Kathryn Underwood   DOB:[birth year]   ZO#:[id number]   [id number]#:[id number]  [place] follow-up note  Subjective: Patient is well-known to me, under my care for her newly diagnosed pancreas cancer.  She was readmitted to hospital due to gastric outlet obstruction from her pancreatic cancer.  She has NG tube in, nausea has improved, overall not feeling well.   Objective:  Vitals:   01/04/23 1051 01/04/23 1324  BP: (!) 97/56 96/60  Pulse:  100  Resp:  16  Temp:  98.8 F (37.1 C)  SpO2:  96%    Body mass index is 17.71 kg/m.  Intake/Output Summary (Last 24 hours) at 01/04/2023 1624 Last data filed at 01/04/2023 1300 Gross per 24 hour  Intake 1698.64 ml  Output 1400 ml  Net 298.64 ml     Sclerae unicteric  Oropharynx clear  MSK no focal spinal tenderness, no peripheral edema  Neuro nonfocal    CBG (last 3)  No results for input(s): "GLUCAP" in the last 72 hours.   Labs:   Urine Studies No results for input(s): "UHGB", "CRYS" in the last 72 hours.  Invalid input(s): "UACOL", "UAPR", "USPG", "UPH", "UTP", "UGL", "UKET", "UBIL", "UNIT", "UROB", "ULEU", "UEPI", "UWBC", "URBC", "UBAC", "CAST", "UCOM", "BILUA"  Basic Metabolic Panel: Recent Labs  Lab 01/03/23 1850 01/03/23 2302 01/04/23 0600 01/04/23 1150  NA 119* 122* 124* 128*  K 2.6* 2.8* 3.1* 2.9*  CL 65* 73* 79* 83*  CO2 38* 38* 36* 36*  GLUCOSE 147* 145* 110* 111*  BUN 57* 55* 43* 35*  CREATININE 0.82 0.68 0.55 0.48  CALCIUM 8.1* 8.2* 8.1* 8.2*  MG  --   --  2.3  --    GFR Estimated Creatinine Clearance: 51.6 mL/min (by C-G formula based on SCr of 0.48 mg/dL). Liver Function Tests: Recent Labs  Lab 01/03/23 1850 01/04/23 0600  AST 17 14*  ALT 10 9  ALKPHOS 139* 112  BILITOT 0.4 0.5  PROT 5.4* 5.0*  ALBUMIN 2.6* 2.3*   Recent Labs  Lab 01/03/23 1850  LIPASE 43   Recent Labs  Lab 01/04/23 0600  AMMONIA 27   Coagulation profile No results for input(s): "INR", "PROTIME" in the last 168  hours.  CBC: Recent Labs  Lab 01/03/23 1702 01/04/23 1150  WBC 31.0* 32.3*  HGB 11.9* 9.3*  HCT 34.3* 26.7*  MCV 94.5 95.4  PLT 615* 468*   Cardiac Enzymes: No results for input(s): "CKTOTAL", "CKMB", "CKMBINDEX", "TROPONINI" in the last 168 hours. BNP: Invalid input(s): "POCBNP" CBG: No results for input(s): "GLUCAP" in the last 168 hours. D-Dimer No results for input(s): "DDIMER" in the last 72 hours. Hgb A1c No results for input(s): "HGBA1C" in the last 72 hours. Lipid Profile No results for input(s): "CHOL", "HDL", "LDLCALC", "TRIG", "CHOLHDL", "LDLDIRECT" in the last 72 hours. Thyroid function studies No results for input(s): "TSH", "T4TOTAL", "T3FREE", "THYROIDAB" in the last 72 hours.  Invalid input(s): "FREET3" Anemia work up No results for input(s): "VITAMINB12", "FOLATE", "FERRITIN", "TIBC", "IRON", "RETICCTPCT" in the last 72 hours. Microbiology Recent Results (from the past 240 hour(s))  Blood culture (routine x 2)     Status: None (Preliminary result)   Collection Time: 01/03/23  6:33 PM   Specimen: BLOOD  Result Value Ref Range Status   Specimen Description   Final    BLOOD PORTA CATH Performed at [facility], [address] [place], [place] [address]    Special Requests   Final    BOTTLES DRAWN AEROBIC AND ANAEROBIC  Blood Culture adequate volume Performed at [facility], [address], [place], [place] [address]    Culture   Final    NO GROWTH < 12 HOURS Performed at [facility], [address], [place], [place] [address]    Report Status PENDING  Incomplete  Blood culture (routine x 2)     Status: None (Preliminary result)   Collection Time: 01/03/23  6:38 PM   Specimen: BLOOD  Result Value Ref Range Status   Specimen Description   Final    BLOOD LEFT ANTECUBITAL Performed at [facility], [address], [place], [place] [address]    Special Requests   Final    BOTTLES DRAWN AEROBIC AND ANAEROBIC  Blood Culture adequate volume Performed at [facility], [address], [place], [place] [address]    Culture   Final    NO GROWTH < 12 HOURS Performed at [facility], [address], [place], [place] [address]    Report Status PENDING  Incomplete      Studies:  CT ABDOMEN PELVIS WO CONTRAST  Result Date: 01/03/2023 CLINICAL DATA:  Abdominal pain. Nausea vomiting for several days. Reported history of pancreatic carcinoma. EXAM: CT ABDOMEN AND PELVIS WITHOUT CONTRAST TECHNIQUE: Multidetector CT imaging of the abdomen and pelvis was performed following the standard protocol without IV contrast. RADIATION DOSE REDUCTION: This exam was performed according to the departmental dose-optimization program which includes automated exposure control, adjustment of the mA and/or kV according to patient size and/or use of iterative reconstruction technique. COMPARISON:  12/23/2022. FINDINGS: Lower chest: Ill-defined peribronchovascular ground-glass and more confluent opacities, most evident in the left lower lobe, new since the prior CT. No pleural effusion. Small pericardial effusion similar to the prior CT. Fluid mildly distends the distal esophagus. Hepatobiliary: Ill-defined hypoattenuation involving segments 4 and 5 as well as portions of segment 6, better defined on the prior contrast enhanced CT, consistent with metastatic disease. There also several more well-defined hypoattenuating liver masses there are stable. No new liver abnormalities. Pigtail drainage catheter lies along the inferior margin of the right lobe, unchanged. Pancreas: Heterogeneous mass with ill-defined margins arises from the pancreatic head with coarse central calcifications. This is unchanged from the prior CT. Fluid collection abuts the right inferior pancreatic head and adjacent duodenum measuring 3.7 cm. Larger fluid collection abuts the proximal pancreatic tail, posterior to the stomach, 5.8 cm in size.  These are similar to the prior CT suspected to be pseudocysts. There is some fat stranding adjacent to the pancreas, similar to the prior CT. Spleen: Normal. Adrenals/Urinary Tract: Mild bilateral adrenal gland thickening consistent with hyperplasia. Normal kidneys, ureters and bladder. Stomach/Bowel: Significant gastric distension. No stomach wall thickening. Duodenum broadly abuts the pancreatic head mass. Wall not defined separate from the mass on this unenhanced exam. Small bowel and colon are normal in caliber. No wall thickening or inflammation. Mild to moderate increase in the colonic stool burden similar to the prior CT. Vascular/Lymphatic: Aortic atherosclerotic calcifications. No defined enlarged lymph nodes. Reproductive: Partly calcified uterine fibroids.  No adnexal masses. Other: No ascites. Musculoskeletal: No fracture or acute finding.  No bone lesion. IMPRESSION: 1. Significant stomach distention. This is suspected to be due to partial gastric outlet obstruction at the level of the duodenum due to the large pancreatic mass. 2. Pancreatic mass, liver lesions and right upper quadrant drainage catheter are without significant change from the recent prior CT, better defined on the prior  study due to the presence of intravenous contrast. 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 These are also unchanged from the recent prior CT. Electronically Signed   By: Amie Portland M.D.   On: 01/03/2023 19:09   DG Chest Portable 1 View  Result Date: 01/03/2023 CLINICAL DATA:  Shortness of breath EXAM: PORTABLE CHEST 1 VIEW COMPARISON:  CT chest dated 12/17/2022 FINDINGS: Mild left lower lobe opacity, atelectasis versus pneumonia. Right lung is clear. No pleural effusion or pneumothorax. The heart is normal in size. Left chest power port terminates in the upper right atrium. IMPRESSION: Mild left lower lobe opacity, atelectasis versus pneumonia. Electronically Signed    By: Charline Bills M.D.   On: 01/03/2023 17:28    Assessment: 62 y.o. female   Gastric outlet obstruction secondary to pancreatic cancer and pseudocysts AKI secondary to nausea and vomiting Severe hyponatremia and hypokalemia Newly diagnosed pancreatic cancer History of alcohol use and pancreatitis COPD Severe protein and calorie malnutrition secondary to malignancy and low oral intake    Plan:  -I reviewed her CT scan from yesterday, which showed gastric outlet obstruction at the level of duodenum secondary to pancreatic mass -GI has been consulted, Dr. Meridee Score plan to do EGD and duodenal stent placement, possible pancreatic pseudocyst aspiration -If duodenal stent placement is not feasible, will consider consulting IR for PEG and J-tube placement  -Other management per primary team -Will cancel her first cycle chemo which is scheduled for this Wednesday, and postpone for 1 to 2 weeks -I will follow-up in 1 to 2 days   Malachy Mood, MD 01/04/2023  4:24 PM

## 2023-01-04 NOTE — Progress Notes (Signed)
OT Cancellation Note  Patient Details Name: Kathryn Underwood MRN: [id number] DOB: 1960-12-27   Cancelled Treatment:    Reason Eval/Treat Not Completed: Medical issues which prohibited therapy Patient sleeping in bed with recent ativan administration per nurse. OT to continue to follow and check back as schedule will allow.  Rosalio Loud, MS Acute Rehabilitation Department Office# [phone]  01/04/2023, 1:44 PM

## 2023-01-04 NOTE — Consult Note (Addendum)
Consultation  Referring Provider: No ref. provider found Primary Care Physician:  Allwardt, Crist Infante, PA-C Primary Gastroenterologist:  Dr.Perry  Reason for Consultation: New diagnosis of pancreatic adenocarcinoma, admitted with progressive weakness, inability to keep down p.o.'s and CT concerning for progressive duodenal obstruction  HPI: Kathryn Underwood is a 62 y.o. female, with history of previous EtOH induced pancreatitis, prior history of substance abuse, anxiety and depression who was unfortunately very recently diagnosed with pancreatic adenocarcinoma during admission in November 2024.  She was found on imaging to have portal vein thrombosis at that time as well and has been on Eliquis. Had admission 11/27 through 12/27/2022 with SIRS, partial gastric outlet obstruction and duodenal stenosis. She had undergone EGD on 12/10/2022 per Dr. Irish Lack Roddy with finding of grade D esophagitis, a large amount of retained food was found in the stomach and in the duodenum.  EUS on 12/12/2022 again with retained food in the stomach, there were a few cystic lesions in the pancreatic head/genu and body as well as large peripancreatic fluid collections, there were a few enlarged lymph nodes.  Pancreatic, after 1 of these was aspirated there was a mass found at the genu and this was sampled.  Cytology from the cystic fluid collections were negative for malignancy, the cytology from the genu mass was positive for adenocarcinoma.  There was found to be diffusely congested mucosa in the duodenal bulb duodenal sweep and second portion of the duodenum.  Patient was ultimately able to keep down full liquids and discharge to home with instructions to maintain a full liquid diet.  She is being followed by Dr. Mosetta Putt, was seen in her office on 12 2 with plans to start chemo later this week. She came to the emergency room yesterday due to progressive weakness over the previous several days, has been vomiting  intermittently dark material.  She is not a great historian but says she has been keeping some liquids down and some protein shakes down but it sounds like she vomits at least once every day and has frequently been vomiting at night sometimes waking her from sleep.  She is trying to keep her medications down.  She is uncertain about the degree of weight loss.  Labs on arrival WBC 31,000 hemoglobin 11.9/hematocrit 34.3/platelets 615 Lactate 2.8 Blood cultures pending Sodium 119/potassium 2.6/BUN 57/creatinine 0.82 LFTs normal with the exception of alk phos of 139  Today sodium 134/potassium 3.1/BUN 43/creatinine 0.55 CBC is pending  CT of the abdomen and pelvis yesterday shows groundglass ill-defined peribronchovascular and more confluent opacities most evident in the left lower lobe new since prior CT, no effusion, there is a small pericardial effusion, there are ill-defined hypoattenuation areas in the liver defined on prior contrast-enhanced CT consistent with metastatic disease, there is significant gastric distention due to partial gastric outlet obstruction abutment of the pancreatic head mass-.heterogeneous mass with ill-defined margins arising from the pancreatic head with coarse calcifications unchanged from prior CT there is a fluid collection abutting the right inferior pancreatic head and adjacent duodenum measuring 3.7 cm and a larger fluid collection at the proximal pancreatic tail measuring 5.8 cm   Patient is miserable this morning does not understand why she is in the hospital and what we are planning to do, does not understand why she is n.p.o., and is tearful very anxious  Past Medical History:  Diagnosis Date   Alcohol abuse    Anxiety    Chickenpox    Depression    Neuromuscular  disorder (HCC)    neuropathy   Pancreatic cancer (HCC)    Substance abuse [facility])     Past Surgical History:  Procedure Laterality Date   BIOPSY  12/10/2022   Procedure: BIOPSY;  Surgeon:  Meridee Score Netty Starring., MD;  Location: [facility];  Service: Gastroenterology;;   CESAREAN SECTION     ESOPHAGOGASTRODUODENOSCOPY N/A 08/14/2021   Procedure: ESOPHAGOGASTRODUODENOSCOPY (EGD);  Surgeon: Rachael Fee, MD;  Location: [facility];  Service: Gastroenterology;  Laterality: N/A;   ESOPHAGOGASTRODUODENOSCOPY N/A 12/12/2022   Procedure: ESOPHAGOGASTRODUODENOSCOPY (EGD);  Surgeon: Lemar Lofty., MD;  Location: [facility];  Service: Gastroenterology;  Laterality: N/A;   ESOPHAGOGASTRODUODENOSCOPY (EGD) WITH PROPOFOL N/A 12/10/2022   Procedure: ESOPHAGOGASTRODUODENOSCOPY (EGD) WITH PROPOFOL;  Surgeon: Meridee Score Netty Starring., MD;  Location: [facility];  Service: Gastroenterology;  Laterality: N/A;   EUS N/A 08/14/2021   Procedure: UPPER ENDOSCOPIC ULTRASOUND (EUS) RADIAL;  Surgeon: Rachael Fee, MD;  Location: [facility];  Service: Gastroenterology;  Laterality: N/A;   EUS N/A 12/12/2022   Procedure: UPPER ENDOSCOPIC ULTRASOUND (EUS) LINEAR;  Surgeon: Lemar Lofty., MD;  Location: [facility];  Service: Gastroenterology;  Laterality: N/A;   FINE NEEDLE ASPIRATION N/A 08/14/2021   Procedure: FINE NEEDLE ASPIRATION (FNA) LINEAR;  Surgeon: Rachael Fee, MD;  Location: [facility];  Service: Gastroenterology;  Laterality: N/A;   FINE NEEDLE ASPIRATION N/A 12/12/2022   Procedure: FINE NEEDLE ASPIRATION (FNA) LINEAR;  Surgeon: Lemar Lofty., MD;  Location: [facility];  Service: Gastroenterology;  Laterality: N/A;   IR IMAGING GUIDED PORT INSERTION  12/18/2022   IR PERC CHOLECYSTOSTOMY  12/13/2022   TONSILLECTOMY      Prior to Admission medications   Medication Sig Start Date End Date Taking? Authorizing Provider  acetaminophen (TYLENOL) 500 MG tablet Take 500-1,000 mg by mouth every 6 (six) hours as needed for moderate pain (pain score 4-6).   Yes [provider]  APIXABAN Everlene Balls) VTE STARTER PACK (10MG  AND 5MG ) Take as directed  on package: start with two-5mg  tablets twice daily for 7 days. On day 8, switch to one-5mg  tablet twice daily. Patient taking differently: Take 5 mg by mouth 2 (two) times daily. Take as directed on package: start with two-5mg  tablets twice daily for 7 days. On day 8, switch to one-5mg  tablet twice daily. 12/18/22  Yes Almon Hercules, MD  Ensure Max Protein (ENSURE MAX PROTEIN) LIQD Take 330 mLs (11 oz total) by mouth 2 (two) times daily. 12/18/22  Yes Almon Hercules, MD  gabapentin (NEURONTIN) 300 MG capsule Take 2 capsules (600 mg total) by mouth 2 (two) times daily. Patient taking differently: Take 900 mg by mouth at bedtime. 10/09/17  Yes Rolan Bucco, MD  lidocaine-prilocaine (EMLA) cream Apply to affected area once 12/28/22  Yes Malachy Mood, MD  mirtazapine (REMERON) 45 MG tablet Take 45 mg by mouth at bedtime. 11/05/21  Yes [provider]  ondansetron (ZOFRAN) 8 MG tablet Take 1 tablet (8 mg total) by mouth every 8 (eight) hours as needed for nausea or vomiting. 12/28/22  Yes Malachy Mood, MD  oxyCODONE (OXY IR/ROXICODONE) 5 MG immediate release tablet Take 1 tablet (5 mg total) by mouth every 4 (four) hours as needed for moderate pain (pain score 4-6). 12/27/22  Yes Alwyn Ren, MD  pantoprazole (PROTONIX) 40 MG tablet Take 1 tablet (40 mg total) by mouth 2 (two) times daily. 12/27/22  Yes Alwyn Ren, MD  prochlorperazine (COMPAZINE) 10 MG tablet Take 1 tablet (10 mg  total) by mouth every 6 (six) hours as needed for nausea or vomiting. 12/28/22  Yes Malachy Mood, MD  QUEtiapine (SEROQUEL) 100 MG tablet Take 200 mg by mouth at bedtime.   Yes [provider]  sucralfate (CARAFATE) 1 GM/10ML suspension Take 10 mLs (1 g total) by mouth 4 (four) times daily -  with meals and at bedtime. 12/23/22  Yes Allwardt, Alyssa M, PA-C  Vitamin D, Ergocalciferol, (DRISDOL) 1.25 MG (50000 UNIT) CAPS capsule Take 1 capsule (50,000 Units total) by mouth every 7 (seven) days. 01/02/23  Yes  Alwyn Ren, MD  apixaban (ELIQUIS) 5 MG TABS tablet Take 2 tablets (10 mg total) by mouth 2 (two) times daily for 7 days, THEN 1 tablet (5 mg total) 2 (two) times daily for 21 days. Patient not taking: Reported on 01/04/2023 12/18/22 01/15/23  Almon Hercules, MD  sodium chloride flush (NS) 0.9 % SOLN Use 5 - 10 mLs to flush abdominal drain once daily as directed 12/18/22   Allred, Darrell K, PA-C    Current Facility-Administered Medications  Medication Dose Route Frequency Provider Last Rate Last Admin   0.9 %  sodium chloride infusion   Intravenous Continuous Hillary Bow, DO 75 mL/hr at 01/03/23 2307 New Bag at 01/03/23 2307   Ampicillin-Sulbactam (UNASYN) 3 g in sodium chloride 0.9 % 100 mL IVPB  3 g Intravenous Q6H Danford Bad, RPH 200 mL/hr at 01/04/23 0519 3 g at 01/04/23 1610   Chlorhexidine Gluconate Cloth 2 % PADS 6 each  6 each Topical Daily Lyda Perone M, DO       enoxaparin (LOVENOX) injection 45 mg  45 mg Subcutaneous BID Veneda Kirksey S, PA-C       LORazepam (ATIVAN) injection 0.5 mg  0.5 mg Intravenous Q4H PRN Rhetta Mura, MD       mirtazapine (REMERON) tablet 45 mg  45 mg Oral QHS Lyda Perone M, DO   45 mg at 01/03/23 2314   morphine (PF) 2 MG/ML injection 2-4 mg  2-4 mg Intravenous Q4H PRN Hillary Bow, DO       ondansetron [facility]) tablet 4 mg  4 mg Oral Q6H PRN Hillary Bow, DO       Or   ondansetron [facility]) injection 4 mg  4 mg Intravenous Q6H PRN Hillary Bow, DO       pantoprazole (PROTONIX) injection 40 mg  40 mg Intravenous Q12H Lyda Perone M, DO   40 mg at 01/03/23 2203   potassium chloride 10 mEq in 100 mL IVPB  10 mEq Intravenous Q1 Hr x 4 Samtani, Jai-Gurmukh, MD       QUEtiapine (SEROQUEL) tablet 200 mg  200 mg Oral QHS Lyda Perone M, DO   200 mg at 01/03/23 2314   sodium chloride flush (NS) 0.9 % injection 10-40 mL  10-40 mL Intracatheter PRN Hillary Bow, DO       sucralfate (CARAFATE) 1 GM/10ML suspension 1 g   1 g Oral TID WC & HS Hillary Bow, DO   1 g at 01/03/23 2313   thiamine (VITAMIN B1) injection 100 mg  100 mg Intravenous Daily Lyda Perone M, DO   100 mg at 01/04/23 0302    Allergies as of 01/03/2023   (No Known Allergies)    Family History  Problem Relation Age of Onset   Dementia Mother    Arthritis Father    Asthma Father    Asthma Brother    Diabetes  Maternal Grandmother    Dementia Maternal Grandmother    Diabetes Paternal Grandmother    Parkinson's disease Maternal Grandfather    Parkinson's disease Paternal Grandfather    Breast cancer Neg Hx    Colon cancer Neg Hx     Social History   Socioeconomic History   Marital status: Divorced    Spouse name: Not on file   Number of children: Not on file   Years of education: Not on file   Highest education level: Not on file  Occupational History   Not on file  Tobacco Use   Smoking status: Some Days    Current packs/day: 0.50    Types: Cigarettes   Smokeless tobacco: Never  Vaping Use   Vaping status: Never Used  Substance and Sexual Activity   Alcohol use: Not Currently    Alcohol/week: 16.0 standard drinks of alcohol    Types: 16 Glasses of wine per week    Comment: daily (1.5 liter bottle wine every 12)   Drug use: Not Currently    Types: Cocaine, Marijuana   Sexual activity: Yes    Partners: Male    Comment: female partner  Other Topics Concern   Not on file  Social History Narrative   Divorced. Homemaker. HS grad.    Lives with life partner (stan)   Drinks caffeine.   Wears seatbelt. Smoke dectector in the home. No firearms in the home.    Feels safe in her relationships.     Social Determinants of Health   Financial Resource Strain: Not on file  Food Insecurity: No Food Insecurity (01/03/2023)   Hunger Vital Sign    Worried About Running Out of Food in the Last Year: Never true    Ran Out of Food in the Last Year: Never true  Transportation Needs: No Transportation Needs (01/03/2023)    PRAPARE - Administrator, Civil Service (Medical): No    Lack of Transportation (Non-Medical): No  Physical Activity: Not on file  Stress: Not on file  Social Connections: Not on file  Intimate Partner Violence: Not At Risk (01/03/2023)   Humiliation, Afraid, Rape, and Kick questionnaire    Fear of Current or Ex-Partner: No    Emotionally Abused: No    Physically Abused: No    Sexually Abused: No    Review of Systems: Pertinent positive and negative review of systems were noted in the above HPI section.  All other review of systems was otherwise negative.   Physical Exam: Vital signs in last 24 hours: Temp:  [97.9 F (36.6 C)-98.2 F (36.8 C)] 98 F (36.7 C) (12/09 0859) Pulse Rate:  [25-146] 92 (12/09 0859) Resp:  [14-28] 16 (12/09 0859) BP: (77-150)/(47-129) 90/57 (12/09 0859) SpO2:  [59 %-100 %] 93 % (12/09 0859) Weight:  [45.4 kg] 45.4 kg (12/08 1628)   General:   Alert,  Well-developed acute and chronically ill older white female, cachectic appearing, anxious and distressed this morning cooperative in NAD Head:  Normocephalic and atraumatic. Eyes:  Sclera clear, no icterus.   Conjunctiva pink. Ears:  Normal auditory acuity. Nose:  No deformity, discharge,  or lesions. Mouth:  No deformity or lesions.   Neck:  Supple; no masses or thyromegaly. Lungs decreased breath sounds bilateral bases  heart:  Regular rate and rhythm; no murmurs, clicks, rubs,  or gallops. Abdomen:  Soft, BS active, mild tenderness across the upper abdomen nonpalp mass or hsm.   Rectal:  Deferred  Msk:  Symmetrical without  gross deformities. . Pulses:  Normal pulses noted. Extremities:  Without clubbing or edema. Neurologic:  Alert and  oriented x3;  , anxious, somewhat tearful and distressed Skin:  Intact without significant lesions or rashes.. Psych:  Alert and cooperative.   Intake/Output from previous day: 12/08 0701 - 12/09 0700 In: 1698.6 [P.O.:50; I.V.:193.7; IV  Piggyback:1455] Out: -  Intake/Output this shift: No intake/output data recorded.  Lab Results: Recent Labs    01/03/23 1702  WBC 31.0*  HGB 11.9*  HCT 34.3*  PLT 615*   BMET Recent Labs    01/03/23 1850 01/03/23 2302 01/04/23 0600  NA 119* 122* 124*  K 2.6* 2.8* 3.1*  CL 65* 73* 79*  CO2 38* 38* 36*  GLUCOSE 147* 145* 110*  BUN 57* 55* 43*  CREATININE 0.82 0.68 0.55  CALCIUM 8.1* 8.2* 8.1*   LFT Recent Labs    01/04/23 0600  PROT 5.0*  ALBUMIN 2.3*  AST 14*  ALT 9  ALKPHOS 112  BILITOT 0.5   PT/INR No results for input(s): "LABPROT", "INR" in the last 72 hours. Hepatitis Panel No results for input(s): "HEPBSAG", "HCVAB", "HEPAIGM", "HEPBIGM" in the last 72 hours.    IMPRESSION:  #56 62 year old white female with very recent diagnosis of pancreatic adenocarcinoma with diagnosis made in November 2024.  She has not yet started chemotherapy. She was found to have a duodenal stenosis with her initial workup but able to tolerate full liquids at that time and was to remain on full liquids at home. She presents now a couple of weeks later with progressive weakness, obvious weight loss continues to attempt protein shakes and liquids but it sounds as if she is vomiting at least once daily and frequently vomiting at nighttime CT imaging consistent with progressive gastric outlet obstruction from duodenal obstruction from pancreatic mass  Previously noted liver lesions are all  now being labeled consistent with metastatic disease  Pt required percutaneous cholecystostomy 12/12/22, and has  common hepatic duct dilation to 15 mm and a high grade stricture over long segment of CBD down to pancreatic head  on MRI/MRCP 12/09/22   #3 significant hyponatremia secondary to above #4.  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 tomorrow Ice chips today then n.p.o. after midnight Ideally  she should have an NG tube but I do not think she will tolerate right now due to severe anxiety Continue IV PPI twice daily  Keep head of bed elevated Continue electrolyte replacement Await blood cultures, on Unasyn Patient is tentatively scheduled for tomorrow 01/05/2023 at 1 PM for EGD and duodenal stent placement and possible EUS with aspiration of pancreatic fluid collection with Dr. Mitchell Heir she is not medically ready for this we we will move to Wednesday afternoon.  Kyzen Horn EsterwoodPA-C  01/04/2023, 9:44 AM

## 2023-01-05 DIAGNOSIS — R634 Abnormal weight loss: Secondary | ICD-10-CM | POA: Diagnosis not present

## 2023-01-05 DIAGNOSIS — E871 Hypo-osmolality and hyponatremia: Secondary | ICD-10-CM | POA: Diagnosis not present

## 2023-01-05 DIAGNOSIS — K863 Pseudocyst of pancreas: Secondary | ICD-10-CM | POA: Diagnosis not present

## 2023-01-05 DIAGNOSIS — K315 Obstruction of duodenum: Secondary | ICD-10-CM | POA: Diagnosis not present

## 2023-01-05 DIAGNOSIS — C258 Malignant neoplasm of overlapping sites of pancreas: Secondary | ICD-10-CM | POA: Diagnosis not present

## 2023-01-05 LAB — CBC WITH DIFFERENTIAL/PLATELET
Abs Immature Granulocytes: 0.4 10*3/uL — ABNORMAL HIGH (ref 0.00–0.07)
Basophils Absolute: 0.1 10*3/uL (ref 0.0–0.1)
Basophils Relative: 0 %
Eosinophils Absolute: 0.5 10*3/uL (ref 0.0–0.5)
Eosinophils Relative: 1 %
HCT: 26 % — ABNORMAL LOW (ref 36.0–46.0)
Hemoglobin: 8.6 g/dL — ABNORMAL LOW (ref 12.0–15.0)
Immature Granulocytes: 1 %
Lymphocytes Relative: 5 %
Lymphs Abs: 1.7 10*3/uL (ref 0.7–4.0)
MCH: 32.7 pg (ref 26.0–34.0)
MCHC: 33.1 g/dL (ref 30.0–36.0)
MCV: 98.9 fL (ref 80.0–100.0)
Monocytes Absolute: 2.8 10*3/uL — ABNORMAL HIGH (ref 0.1–1.0)
Monocytes Relative: 9 %
Neutro Abs: 27.4 10*3/uL — ABNORMAL HIGH (ref 1.7–7.7)
Neutrophils Relative %: 84 %
Platelets: 443 10*3/uL — ABNORMAL HIGH (ref 150–400)
RBC: 2.63 MIL/uL — ABNORMAL LOW (ref 3.87–5.11)
RDW: 13.6 % (ref 11.5–15.5)
WBC: 32.9 10*3/uL — ABNORMAL HIGH (ref 4.0–10.5)
nRBC: 0 % (ref 0.0–0.2)

## 2023-01-05 LAB — BASIC METABOLIC PANEL
Anion gap: 7 (ref 5–15)
BUN: 29 mg/dL — ABNORMAL HIGH (ref 8–23)
CO2: 35 mmol/L — ABNORMAL HIGH (ref 22–32)
Calcium: 7.9 mg/dL — ABNORMAL LOW (ref 8.9–10.3)
Chloride: 86 mmol/L — ABNORMAL LOW (ref 98–111)
Creatinine, Ser: 0.35 mg/dL — ABNORMAL LOW (ref 0.44–1.00)
GFR, Estimated: >60 mL/min (ref >60)
Glucose, Bld: 105 mg/dL — ABNORMAL HIGH (ref 70–99)
Potassium: 3.3 mmol/L — ABNORMAL LOW (ref 3.5–5.1)
Sodium: 128 mmol/L — ABNORMAL LOW (ref 135–145)

## 2023-01-05 MED ORDER — KCL IN DEXTROSE-NACL 40-5-0.9 MEQ/L-%-% IV SOLN
INTRAVENOUS | Status: AC
  Filled 2023-01-05: qty 1000

## 2023-01-05 MED ORDER — MORPHINE SULFATE (PF) 2 MG/ML IV SOLN
2.0000 mg | Freq: Once | INTRAVENOUS | Status: AC
  Administered 2023-01-05: 2 mg via INTRAVENOUS

## 2023-01-05 MED ORDER — ENOXAPARIN SODIUM 60 MG/0.6ML IJ SOSY
50.0000 mg | PREFILLED_SYRINGE | Freq: Once | INTRAMUSCULAR | Status: AC
  Administered 2023-01-05: 50 mg via SUBCUTANEOUS
  Filled 2023-01-05: qty 0.6

## 2023-01-05 NOTE — Plan of Care (Signed)

## 2023-01-05 NOTE — Evaluation (Signed)
Occupational Therapy Evaluation Patient Details Name: Kathryn Underwood MRN: [id number] DOB: [birth year] Today's Date: 01/05/2023   History of Present Illness Pt is a 62 yr old female presenting with weakness, abdominal discomfort, and dizziness in standing. She was found to have hyponatremia, AKI, and gastric outlet obstruction with suspected pancreatic malignancy and pseudocysts. Pt recently admitted 11/27-12/1 with SIRS, duodenal ulcer, and partial gastric outlet obstruction. PMH: ETOH abuse, pancreatitis with pseudocyst, anxiety, depression, neuropathy, and recent diagnosis of pancreatic adenocarcinoma.   Clinical Impression   The pt is currently presenting below her baseline level of functioning for self-care management, given the below listed deficits (see OT problem list). During the therapy session today, she required CGA to stand & perform a step-pivot transfer to the bedside chair and SBA for donning/doffing socks seated in the chair. She was noted to be with general deconditioning, decreased activity tolerance/quick fatigue, and subjective reports of increased weakness. As such, she gently deferred attempts at progressive functional activity, including ambulation.  Pt's significant other inquired about the medical plan of care going forward and expressed a desire to speak with medical team in this regard; he further indicated concerns about being able to manage the pt's care in the home (MD and nurse made aware). OT will continue to follow the pt for further services in the acute care setting to maximize the pt's independence with self care tasks and to decrease the risk for further weakness and deconditioning, should pt and her family continue to desire therapy services going forward. OT to update recommendations accordingly, pending pt & family preferences and decisions on goals of care.      If plan is discharge home, recommend the following: A little help with walking and/or transfers;A  little help with bathing/dressing/bathroom;Assistance with cooking/housework;Assist for transportation;Help with stairs or ramp for entrance    Functional Status Assessment  Patient has had a recent decline in their functional status and demonstrates the ability to make significant improvements in function in a reasonable and predictable amount of time.  Equipment Recommendations  None recommended by OT    Recommendations for Other Services       Precautions / Restrictions Precautions Precautions: Fall Restrictions Weight Bearing Restrictions: No      Mobility Bed Mobility Overal bed mobility: Modified Independent     Transfers Overall transfer level: Needs assistance Equipment used: None Transfers: Sit to/from Stand, Bed to chair/wheelchair/BSC Sit to Stand: Contact guard assist     Step pivot transfers: Contact guard assist     General transfer comment: Slightly increased time to steady in standing. Pt reports general fatigue due to no P.O. intake. Agreeable to short transfer to chair.      Balance     Sitting balance-Leahy Scale: Good         Standing balance comment: CGA to chair             ADL either performed or assessed with clinical judgement   ADL Overall ADL's : Needs assistance/impaired;Independent Eating/Feeding: Independent;Sitting Eating/Feeding Details (indicate cue type and reason): based on clinical judgement Grooming: Set up;Sitting           Upper Body Dressing : Set up;Sitting   Lower Body Dressing: Set up;Supervision/safety Lower Body Dressing Details (indicate cue type and reason): The pt doffed then donned her socks with SBA seated in bedside chair. She implemented the figure 4 technique to perform.  Pertinent Vitals/Pain Pain Assessment Pain Assessment: Faces Faces Pain Scale: Hurts little more Pain Location: back Pain Descriptors / Indicators: Discomfort, Sore Pain Intervention(s):  Limited activity within patient's tolerance, Monitored during session, Repositioned     Extremity/Trunk Assessment Upper Extremity Assessment Upper Extremity Assessment: RUE deficits/detail;Overall [facility] for tasks assessed   Lower Extremity Assessment Lower Extremity Assessment: Generalized weakness       Communication Communication Communication: No apparent difficulties   Cognition Arousal: Alert Behavior During Therapy: WFL for tasks assessed/performed Overall Cognitive Status: Within Functional Limits for tasks assessed                        Shoulder Instructions      Home Living Family/patient expects to be discharged to:: Private residence Living Arrangements: Spouse/significant other Available Help at Discharge: Family Type of Home: House     Entrance Stairs-Rails: None Home Layout: Multi-level;Able to live on main level with bedroom/bathroom               Home Equipment: BSC/3in1;Shower seat;Hospital bed;Wheelchair - manual   Additional Comments: Denies history of falls. Reports fiance has been working from home recently. Pt reports son and daughter live close (unsure how much assist they can provide in addition to fiance.)      Prior Functioning/Environment Prior Level of Function : Independent/Modified Independent             Mobility Comments: No AD used at baseline. Denies history of falls. ADLs Comments: Reports independent with bathing/dressing/toileting. Has not been driving lately.        OT Problem List: Decreased strength;Decreased activity tolerance;Impaired balance (sitting and/or standing);Pain      OT Treatment/Interventions: Self-care/ADL training;Therapeutic exercise;Energy conservation;DME and/or AE instruction;Therapeutic activities;Patient/family education;Balance training    OT Goals(Current goals can be found in the care plan section) Acute Rehab OT Goals OT Goal Formulation: With patient/family Time For Goal  Achievement: 01/19/23 Potential to Achieve Goals:  (Guarded at current) ADL Goals Pt Will Perform Grooming: with modified independence;standing Pt Will Perform Upper Body Dressing: Independently;sitting Pt Will Perform Lower Body Dressing: with modified independence;sit to/from stand Pt Will Transfer to Toilet: with modified independence;ambulating Pt Will Perform Toileting - Clothing Manipulation and hygiene: with modified independence;sit to/from stand  OT Frequency: Min 1X/week    Co-evaluation PT/OT/SLP Co-Evaluation/Treatment: Yes Reason for Co-Treatment: To address functional/ADL transfers PT goals addressed during session: Mobility/safety with mobility OT goals addressed during session: ADL's and self-care      AM-PAC OT "6 Clicks" Daily Activity     Outcome Measure Help from another person eating meals?: Total (NG tube at current) Help from another person taking care of personal grooming?: A Little Help from another person toileting, which includes using toliet, bedpan, or urinal?: A Little Help from another person bathing (including washing, rinsing, drying)?: A Little Help from another person to put on and taking off regular upper body clothing?: A Little Help from another person to put on and taking off regular lower body clothing?: A Little 6 Click Score: 16   End of Session Equipment Utilized During Treatment: Oxygen Nurse Communication: Other (comment) (pt's spouse's concerns regarding medical treatment plan and questions about goals of care)  Activity Tolerance: Patient limited by fatigue Patient left: in chair;with call bell/phone within reach;with family/visitor present  OT Visit Diagnosis: Muscle weakness (generalized) (M62.81);Pain                Time: 1610-9604 OT Time Calculation (min): 42 min Charges:  OT General Charges $OT Visit: 1 Visit OT Evaluation $OT Eval Moderate Complexity: 1 Mod    Andersson Larrabee L Nayellie Sanseverino, OTR/L 01/05/2023, 7:58 PM

## 2023-01-05 NOTE — Progress Notes (Signed)
HOSPITALIST ROUNDING NOTE CHEVI BURKLE ZOX:[id number]  DOB: Sep 16, 1960  DOA: 01/03/2023  PCP: Allwardt, Alyssa M, PA-C  01/05/2023,3:40 PM   LOS: 2 days      Code Status: Full code From: Home  current Dispo: Unclear     62 year old home dwelling white female  pancreatic adenocarcinoma cT3 N0 M0 diagnosed 11/2022 in the setting of prior pancreatitis with pseudocyst EtOH Bipolar Hospitalization 11/12--11/22 fatigue intentional weight loss imaging = pancreatic neoplasm-EGD EUS 11/16 caused abdominal pain CT ABD GU 00 secondary to infiltrating pancreatic mass + ulcer-GB was distended-IR performed [facility] cholecystotomy 11/17-she was discharged on full liquid diet to follow-up in the outpatient setting and Port-A-Cath was placed   readmit 11/27 through 12/27/2022 epigastric pain nausea?  SIRS--treated empirically IV antibiotic recs no source obtained felt to be secondary to her esophagitis  discussion regarding nab-paclitaxel gemcitabine on 12/2 after outpatient follow-up Dr. Mosetta Putt was given IV fluids additionally  12/8 readmit N/V/weakness, increasing emesis with dark color Sodium 122 potassium 2.8 BUN/creatinine 55/0.6 ( 8/0.4), bicarb 38 WBC 31 platelet 615 hemoglobin 11.9 CXR?  LLL opacity atelectasis PNA CT ABD pelvis-significant stomach distention 2/2 partial gastric outlet obstruction level of duodenum, pancreatic mass liver lesions RUQ without significant change primary pancreatic lesions suspected to be pseudocysts unchanged 12/20 GI consulted--planning for endoscopy EGD + fluoroscopy 2/2 impending gastric outlet obstruction not allowing linear echoendoscope to pass through  Plan  ATN/AKI secondary to nausea vomiting Severe hyponatremia, hypokalemia, met alkalosis secondary to loss of abdominal succus/acid Sodium slightly improved, potassium slightly improved Check magnesium in a.m.-alkalosis will improve with stopping the vomiting Continue D5 saline KCl 40 cc/H  Leukocytosis?  Pneumonia  versus pancreatic pseudocyst Started on Unasyn-white count still quite elevated--- portends poor prognosis in addition to her cachexia, hypoproteinemia  Underlying gastric outlet obstruction with pancreatic malignancy and pseudocysts NG tube placed not putting out a whole lot Keep in place at this time  Occluded splenic superior mesenteric vein/portal confluence--- could have some bearing on leukocytosis Cont apixaban, continues at this time on Lovenox therapeutic dosing per pharmacy twice daily-hold for procedure  Cholecystotomy previously 11/22 As outpatient follow-up with IR on 01/29/2023-still putting out > 25 cc/d--so would wait for drain study  Previous EtOH Bipolar Continue Remeron 45 and Seroquel 200 at bedtime if able to tolerate p.o. Because of difficulty coping and anxiety have placed on Ativan 0.5 every 4 as needed for 6 doses  Previous COPD Not on home meds  Cancer related cachexia BMI 18 nutrition as able  I sat with her and her significant other at the bedside today and we had a conversation about current state of events--- we went over her hospitalization today including poor p.o. intake, cachexia and also probably the need for artificial nutrition I am concerned that we will end up doing a lot of procedures that may have no durable longstanding benefit for her and subject her to the harm from these procedures I have broached the topic of DNR versus full code trajectory and she confirms DNR and I have changed CODE STATUS accordingly I have asked palliative care to come by and talk to the patient and reemphasize what the goals of care may be as her performance score is abysmal and it may stand to reason that if there is poor recoverability from some of these procedures and no durable improvement she may be better served by being on hospice > 35 minutes extra discussion about these issues   DVT prophylaxis: lovenox  Status is: Inpatient  Remains inpatient appropriate  because:   Requires GI input/procedure      Subjective:  Less anxious looks a little bit sedate no pain no fever no chills   Objective + exam Vitals:   01/04/23 2059 01/04/23 2130 01/05/23 0525 01/05/23 0953  BP: (!) 106/50 111/62 100/76 102/72  Pulse: 98 89  94  Resp:   20 19  Temp: 97.9 F (36.6 C) 97.9 F (36.6 C) 98.5 F (36.9 C) 98.6 F (37 C)  TempSrc: Oral Oral Oral Oral  SpO2: 97% 98% 91% 93%  Weight:      Height:       Filed Weights   01/03/23 1628  Weight: 45.4 kg    Examination:  Anxious cachectic S1-S2 no murmur tachycardic Cholecystotomy drain in place draining minimally No lower extremity edema     Data Reviewed: reviewed   CBC    Component Value Date/Time   WBC 32.9 (H) 01/05/2023 0027   RBC 2.63 (L) 01/05/2023 0027   HGB 8.6 (L) 01/05/2023 0027   HGB 13.6 12/28/2022 1107   HCT 26.0 (L) 01/05/2023 0027   PLT 443 (H) 01/05/2023 0027   PLT 705 (H) 12/28/2022 1107   MCV 98.9 01/05/2023 0027   MCH 32.7 01/05/2023 0027   MCHC 33.1 01/05/2023 0027   RDW 13.6 01/05/2023 0027   LYMPHSABS 1.7 01/05/2023 0027   MONOABS 2.8 (H) 01/05/2023 0027   EOSABS 0.5 01/05/2023 0027   BASOSABS 0.1 01/05/2023 0027      Latest Ref Rng & Units 01/05/2023   12:26 AM 01/04/2023    6:59 PM 01/04/2023   11:50 AM  CMP  Glucose 70 - 99 mg/dL 478  295  621   BUN 8 - 23 mg/dL 29  30  35   Creatinine 0.44 - 1.00 mg/dL 3.08  6.57  8.46   Sodium 135 - 145 mmol/L 128  128  128   Potassium 3.5 - 5.1 mmol/L 3.3  2.7  2.9   Chloride 98 - 111 mmol/L 86  84  83   CO2 22 - 32 mmol/L 35  34  36   Calcium 8.9 - 10.3 mg/dL 7.9  8.0  8.2      Scheduled Meds:  Chlorhexidine Gluconate Cloth  6 each Topical Daily   enoxaparin (LOVENOX) injection  50 mg Subcutaneous Once   mirtazapine  45 mg Oral QHS   pantoprazole (PROTONIX) IV  40 mg Intravenous Q12H   QUEtiapine  200 mg Oral QHS   sucralfate  1 g Oral TID WC & HS   thiamine (VITAMIN B1) injection  100 mg  Intravenous Daily   Continuous Infusions:  ampicillin-sulbactam (UNASYN) IV 3 g (01/05/23 1353)   dextrose 5 % and 0.9 % NaCl with KCl 40 mEq/L 75 mL/hr at 01/05/23 1220    Time  40 + 25 palliative minutes  Rhetta Mura, MD  [facility]

## 2023-01-05 NOTE — Progress Notes (Signed)
Patient ID: Kathryn Underwood, female   DOB: Oct 08, 1960, 62 y.o.   MRN: [id number]    Progress Note   Subjective   Day 3 2 CC;pancreatic cancer with outlet obstruction,malnutrition, weakness,SIRS  IV Unasyn  Blood cultures no growth thus far  Labs today-WBC 32.9/hemoglobin 8.6/hematocrit 26.0/platelets 443 Sodium 128/potassium 3.3/BUN 29/creatinine 0.35     Objective   Vital signs in last 24 hours: Temp:  [97.9 F (36.6 C)-99.3 F (37.4 C)] 98.5 F (36.9 C) (12/10 0525) Pulse Rate:  [89-100] 89 (12/09 2130) Resp:  [16-20] 20 (12/10 0525) BP: (90-111)/(50-76) 100/76 (12/10 0525) SpO2:  [91 %-98 %] 91 % (12/10 0525) Last BM Date : 01/03/23 General: Acute and chronically ill-appearing older white female   white female in NAD, weak,frail NG tube in very little output out 10 cc Heart:  Regular rate and rhythm; no murmurs Lungs: Respirations even and unlabored, decreased breath sounds bilaterally more prominent on the left Abdomen:  Soft, nontender and nondistended. Normal bowel sounds.  Cholecystostomy tube draining bilious effluent about 75 cc Extremities:  Without edema. Neurologic:  Alert and oriented,  grossly normal neurologically. Psych:  Cooperative.  Calmer, more somnolent today  Intake/Output from previous day: 12/09 0701 - 12/10 0700 In: 1561.4 [I.V.:637.8; IV Piggyback:923.6] Out: 1600 [Urine:1400; Drains:200] Intake/Output this shift: No intake/output data recorded.  Lab Results: Recent Labs    01/03/23 1702 01/04/23 1150 01/05/23 0027  WBC 31.0* 32.3* 32.9*  HGB 11.9* 9.3* 8.6*  HCT 34.3* 26.7* 26.0*  PLT 615* 468* 443*   BMET Recent Labs    01/04/23 1150 01/04/23 1859 01/05/23 0026  NA 128* 128* 128*  K 2.9* 2.7* 3.3*  CL 83* 84* 86*  CO2 36* 34* 35*  GLUCOSE 111* 113* 105*  BUN 35* 30* 29*  CREATININE 0.48 0.33* 0.35*  CALCIUM 8.2* 8.0* 7.9*   LFT Recent Labs    01/04/23 0600  PROT 5.0*  ALBUMIN 2.3*  AST 14*  ALT 9  ALKPHOS 112   BILITOT 0.5   PT/INR No results for input(s): "LABPROT", "INR" in the last 72 hours.  Studies/Results: DG Abd Portable 1V  Result Date: 01/04/2023 CLINICAL DATA:  Nasogastric tube placement. EXAM: PORTABLE ABDOMEN - 1 VIEW COMPARISON:  Portable chest dated 01/03/2023 and abdomen and pelvis CT dated 01/03/2023. FINDINGS: Nasogastric tube side hole just distal to the gastroesophageal junction and tip in the proximal to mid stomach. With left jugular catheter tip at the superior cavoatrial junction. Resolved left basilar atelectasis. The included bowel-gas pattern is unremarkable. IMPRESSION: Nasogastric tube tip in the proximal to mid stomach. Electronically Signed   By: Beckie Salts M.D.   On: 01/04/2023 17:29   CT ABDOMEN PELVIS WO CONTRAST  Result Date: 01/03/2023 CLINICAL DATA:  Abdominal pain. Nausea vomiting for several days. Reported history of pancreatic carcinoma. EXAM: CT ABDOMEN AND PELVIS WITHOUT CONTRAST TECHNIQUE: Multidetector CT imaging of the abdomen and pelvis was performed following the standard protocol without IV contrast. RADIATION DOSE REDUCTION: This exam was performed according to the departmental dose-optimization program which includes automated exposure control, adjustment of the mA and/or kV according to patient size and/or use of iterative reconstruction technique. COMPARISON:  12/23/2022. FINDINGS: Lower chest: Ill-defined peribronchovascular ground-glass and more confluent opacities, most evident in the left lower lobe, new since the prior CT. No pleural effusion. Small pericardial effusion similar to the prior CT. Fluid mildly distends the distal esophagus. Hepatobiliary: Ill-defined hypoattenuation involving segments 4 and 5 as well as portions of segment 6, better  defined on the prior contrast enhanced CT, consistent with metastatic disease. There also several more well-defined hypoattenuating liver masses there are stable. No new liver abnormalities. Pigtail drainage  catheter lies along the inferior margin of the right lobe, unchanged. Pancreas: Heterogeneous mass with ill-defined margins arises from the pancreatic head with coarse central calcifications. This is unchanged from the prior CT. Fluid collection abuts the right inferior pancreatic head and adjacent duodenum measuring 3.7 cm. Larger fluid collection abuts the proximal pancreatic tail, posterior to the stomach, 5.8 cm in size. These are similar to the prior CT suspected to be pseudocysts. There is some fat stranding adjacent to the pancreas, similar to the prior CT. Spleen: Normal. Adrenals/Urinary Tract: Mild bilateral adrenal gland thickening consistent with hyperplasia. Normal kidneys, ureters and bladder. Stomach/Bowel: Significant gastric distension. No stomach wall thickening. Duodenum broadly abuts the pancreatic head mass. Wall not defined separate from the mass on this unenhanced exam. Small bowel and colon are normal in caliber. No wall thickening or inflammation. Mild to moderate increase in the colonic stool burden similar to the prior CT. Vascular/Lymphatic: Aortic atherosclerotic calcifications. No defined enlarged lymph nodes. Reproductive: Partly calcified uterine fibroids.  No adnexal masses. Other: No ascites. Musculoskeletal: No fracture or acute finding.  No bone lesion. IMPRESSION: 1. Significant stomach distention. This is suspected to be due to partial gastric outlet obstruction at the level of the duodenum due to the large pancreatic mass. 2. Pancreatic mass, liver lesions and right upper quadrant drainage catheter are without significant change from the recent prior CT, better defined on the prior study due to the presence of intravenous contrast. 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 These are also unchanged from the recent prior CT. Electronically Signed   By: Amie Portland M.D.   On: 01/03/2023 19:09   DG Chest Portable 1 View  Result  Date: 01/03/2023 CLINICAL DATA:  Shortness of breath EXAM: PORTABLE CHEST 1 VIEW COMPARISON:  CT chest dated 12/17/2022 FINDINGS: Mild left lower lobe opacity, atelectasis versus pneumonia. Right lung is clear. No pleural effusion or pneumothorax. The heart is normal in size. Left chest power port terminates in the upper right atrium. IMPRESSION: Mild left lower lobe opacity, atelectasis versus pneumonia. Electronically Signed   By: Charline Bills M.D.   On: 01/03/2023 17:28       Assessment / Plan:    #74 62 year old female with new diagnosis of advanced pancreatic cancer diagnosed November 2024 who presents with symptoms consistent with gastric outlet obstruction, inability to take much p.o. at home over the past weeks and increasing frequency of vomiting.  Quite ill on admission with severe hyponatremia, severe hypokalemia, marked leukocytosis. Hyponatremia and hypokalemia significantly improved She continues with significant leukocytosis, suspect underlying infection i.e. 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 after midnight Antibiotics as per primary service Will plan to schedule for EGD with duodenal stent placement for tomorrow with Dr. Meridee Score, and possible EUS with aspiration  of pancreatic fluid collection  Will discuss initiating TPN, she has a Port-A-Cath  in place, and even if we are able to get a duodenal stent and she will still be limited to clear to full liquids and is quite malnourished at this point   Repeat labs in a.m.  Principal Problem:   Hyponatremia Active Problems:   Pancreatic pseudocyst   Protein-calorie malnutrition, severe   Partial gastric outlet obstruction   Pancreatic adenocarcinoma (HCC)   SIRS (systemic inflammatory response syndrome) (HCC)   Overlapping malignant neoplasm of pancreas (HCC)   Failure to thrive (child)   Gastric outlet obstruction     LOS: 2 days   Kathryn Benegas EsterwoodPA-C  01/05/2023, 8:38 AM

## 2023-01-05 NOTE — Evaluation (Addendum)
Physical Therapy Evaluation Patient Details Name: Kathryn Underwood MRN: [id number] DOB: 01-14-1961 Today's Date: 01/05/2023  History of Present Illness  Pt is a 62 y.o. female presenting with hypovolemic hyponatremia due to decreased PO intake, increased N/V from gastric outlet obstruction. Pt recently admitted 11/27-12/1 with SIRS, duodenal ulcer, and partial gastric outlet obstruction. Past medical history significant of alcoholism, pancreatitis with pseudocyst, anxiety, depression, neuropathy, and recent diagnosis of pancreatic adenocarcinoma.   Clinical Impression  Pt is a 62 y.o. female with above HPI resulting in the deficits listed below (see PT Problem List). At baseline prior to recent hospitalizations, pt was independent with mobility and ADLs. Pt with increased generalized fatigue and decreased activity tolerance on eval. Pt was able to perform bed mobility with modified independence (HOB elevated) sit to stand transfers and step pivot transfer to recliner chair with CGA and no AD use. Pt's fiance entering during session- he has been assisting at home, currently working from home. Pt's fiance has expressed concerns on ability to continue care for pt at home. Pt and family requesting spiritual care and hospice consults. If pt/family decide to return home they have wheelchair, hospital bed, shower seat, and BSC already per pt and fiance report. Pt will benefit from trial of skilled PT services to maximize functional mobility, increase independence to maximize quality of life and decrease caregiver burden, and education for pt and family training.           If plan is discharge home, recommend the following: A little help with walking and/or transfers;A little help with bathing/dressing/bathroom;Help with stairs or ramp for entrance;Assist for transportation;Assistance with cooking/housework   Can travel by private vehicle        Equipment Recommendations    Recommendations for Other  Services       Functional Status Assessment Patient has had a recent decline in their functional status and/or demonstrates limited ability to make significant improvements in function in a reasonable and predictable amount of time     Precautions / Restrictions Precautions Precautions: Fall Restrictions Weight Bearing Restrictions: No      Mobility  Bed Mobility Overal bed mobility: Modified Independent             General bed mobility comments: increased time, HOB elevated.    Transfers Overall transfer level: Needs assistance Equipment used: None Transfers: Sit to/from Stand, Bed to chair/wheelchair/BSC Sit to Stand: Contact guard assist   Step pivot transfers: Contact guard assist       General transfer comment: Slightly increased time to steady in standing. Pt reports general fatigue due to no P.O. intake. Agreeable to short transfer to chair.    Ambulation/Gait                  Stairs            Wheelchair Mobility     Tilt Bed    Modified Rankin (Stroke Patients Only)       Balance Overall balance assessment: Mild deficits observed, not formally tested                                           Pertinent Vitals/Pain Pain Assessment Pain Assessment: Faces Faces Pain Scale: Hurts little more Pain Location: back Pain Descriptors / Indicators: Discomfort, Sore Pain Intervention(s): Limited activity within patient's tolerance, Monitored during session, Repositioned    Home Living Family/patient  expects to be discharged to:: Private residence Living Arrangements: Spouse/significant other Available Help at Discharge: Family Type of Home: House Home Access: Stairs to enter Entrance Stairs-Rails: None Entrance Stairs-Number of Steps: 2   Home Layout: Multi-level;Able to live on main level with bedroom/bathroom Home Equipment: BSC/3in1;Shower seat;Hospital bed;Wheelchair - manual Additional Comments: denies history  of falls. Reports fiance has been working from home recently. Pt reports son and daughter live close (unsure how much assist they can provide in addition to fiance.)    Prior Function Prior Level of Function : Independent/Modified Independent             Mobility Comments: no AD at baseline. Denies history of falls. ADLs Comments: Reports independent with bathing/dressing. Has not been driving lately.     Extremity/Trunk Assessment   Upper Extremity Assessment Upper Extremity Assessment: Defer to OT evaluation    Lower Extremity Assessment Lower Extremity Assessment: Generalized weakness    Cervical / Trunk Assessment Cervical / Trunk Assessment: Normal  Communication   Communication Communication: No apparent difficulties  Cognition Arousal: Alert Behavior During Therapy: WFL for tasks assessed/performed Overall Cognitive Status: Within Functional Limits for tasks assessed                                          General Comments General comments (skin integrity, edema, etc.): care team notified on pt's fiance expressing concerns on ability to care for pt at home, request for hospice and spiritual care consults.    Exercises     Assessment/Plan    PT Assessment Patient needs continued PT services  PT Problem List Decreased strength;Decreased activity tolerance;Decreased balance;Decreased mobility       PT Treatment Interventions DME instruction;Gait training;Stair training;Functional mobility training;Therapeutic activities;Therapeutic exercise;Balance training;Patient/family education;Wheelchair mobility training    PT Goals (Current goals can be found in the Care Plan section)  Acute Rehab PT Goals Patient Stated Goal: Go home PT Goal Formulation: With patient/family Time For Goal Achievement: 01/19/23 Potential to Achieve Goals: Fair    Frequency Min 1X/week     Co-evaluation               AM-PAC PT "6 Clicks" Mobility   Outcome Measure Help needed turning from your back to your side while in a flat bed without using bedrails?: None Help needed moving from lying on your back to sitting on the side of a flat bed without using bedrails?: None Help needed moving to and from a bed to a chair (including a wheelchair)?: A Little Help needed standing up from a chair using your arms (e.g., wheelchair or bedside chair)?: A Little Help needed to walk in hospital room?: A Little Help needed climbing 3-5 steps with a railing? : A Lot 6 Click Score: 19    End of Session   Activity Tolerance: Patient limited by fatigue Patient left: in chair;with call bell/phone within reach;with chair alarm set;with family/visitor present Nurse Communication: Mobility status;Other (comment) (Pt's significant other concerns about ability to take care of pt at home, etc. discussed with care team.) PT Visit Diagnosis: Muscle weakness (generalized) (M62.81);Difficulty in walking, not elsewhere classified (R26.2)    Time: 1191-4782 PT Time Calculation (min) (ACUTE ONLY): 48 min   Charges:   PT Evaluation $PT Eval Moderate Complexity: 1 Mod PT Treatments $Therapeutic Activity: 8-22 mins PT General Charges $$ ACUTE PT VISIT: 1 Visit  Lyman Speller., PT, DPT  Acute Rehabilitation Services  Office [phone]    01/05/2023, 5:30 PM

## 2023-01-05 NOTE — Progress Notes (Signed)
OT Cancellation Note  Patient Details Name: Kathryn Underwood MRN: [id number] DOB: 1960/09/22   Cancelled Treatment:    Reason Eval/Treat Not Completed: Fatigue/lethargy limiting ability to participate. Pt gently deferred participation in therapy due to fatigue and reporting possibility of having a procedure this p.m. Will continue attempts to complete eval.   Reuben Likes, OTR/L 01/05/2023, 3:22 PM

## 2023-01-05 NOTE — Progress Notes (Addendum)
   01/05/23 1710  Spiritual Encounters  Type of Visit Initial  Care provided to: Patient  Referral source Other (comment) (Spiritual Consult)  Reason for visit Routine spiritual support  OnCall Visit No   Chaplain responded to a spiritual consult for support of the patient. The patient, Kathryn Underwood was alert when I entered the room however she did not remember asking for a chaplain. Additionally she declined my offer to listen if she wished to talk.   As I departed I advised her nurse that if she changed her mind to put in a new consult and someone would respond.   Valerie Roys [facility] [phone]

## 2023-01-06 ENCOUNTER — Inpatient Hospital Stay: Payer: Medicaid Other

## 2023-01-06 ENCOUNTER — Other Ambulatory Visit: Payer: Medicaid Other

## 2023-01-06 ENCOUNTER — Encounter: Payer: Self-pay | Admitting: Gastroenterology

## 2023-01-06 ENCOUNTER — Inpatient Hospital Stay (HOSPITAL_COMMUNITY): Payer: Medicaid Other

## 2023-01-06 ENCOUNTER — Inpatient Hospital Stay: Payer: Medicaid Other | Admitting: Hematology

## 2023-01-06 ENCOUNTER — Inpatient Hospital Stay (HOSPITAL_COMMUNITY): Payer: Medicaid Other | Admitting: Anesthesiology

## 2023-01-06 ENCOUNTER — Inpatient Hospital Stay: Payer: Medicaid Other | Admitting: Dietician

## 2023-01-06 ENCOUNTER — Encounter (HOSPITAL_COMMUNITY)
Admission: EM | Disposition: A | Discharge status: Home Health | Payer: Self-pay | Source: Home / Self Care | Attending: Internal Medicine

## 2023-01-06 ENCOUNTER — Encounter (HOSPITAL_COMMUNITY): Payer: Self-pay | Admitting: Internal Medicine

## 2023-01-06 DIAGNOSIS — C258 Malignant neoplasm of overlapping sites of pancreas: Secondary | ICD-10-CM | POA: Diagnosis not present

## 2023-01-06 DIAGNOSIS — K863 Pseudocyst of pancreas: Secondary | ICD-10-CM | POA: Diagnosis not present

## 2023-01-06 DIAGNOSIS — E43 Unspecified severe protein-calorie malnutrition: Secondary | ICD-10-CM | POA: Diagnosis not present

## 2023-01-06 DIAGNOSIS — K208 Other esophagitis without bleeding: Secondary | ICD-10-CM

## 2023-01-06 DIAGNOSIS — K862 Cyst of pancreas: Secondary | ICD-10-CM | POA: Diagnosis not present

## 2023-01-06 DIAGNOSIS — K315 Obstruction of duodenum: Secondary | ICD-10-CM | POA: Diagnosis not present

## 2023-01-06 DIAGNOSIS — E871 Hypo-osmolality and hyponatremia: Secondary | ICD-10-CM | POA: Diagnosis not present

## 2023-01-06 HISTORY — PX: FINE NEEDLE ASPIRATION: SHX6590

## 2023-01-06 HISTORY — PX: ESOPHAGOGASTRODUODENOSCOPY (EGD) WITH PROPOFOL: SHX5813

## 2023-01-06 HISTORY — PX: EUS: SHX5427

## 2023-01-06 HISTORY — PX: DUODENAL STENT PLACEMENT: SHX5541

## 2023-01-06 LAB — CBC WITH DIFFERENTIAL/PLATELET
Abs Immature Granulocytes: 0.26 10*3/uL — ABNORMAL HIGH (ref 0.00–0.07)
Basophils Absolute: 0.2 10*3/uL — ABNORMAL HIGH (ref 0.0–0.1)
Basophils Relative: 1 %
Eosinophils Absolute: 1.9 10*3/uL — ABNORMAL HIGH (ref 0.0–0.5)
Eosinophils Relative: 8 %
HCT: 24.9 % — ABNORMAL LOW (ref 36.0–46.0)
Hemoglobin: 8.1 g/dL — ABNORMAL LOW (ref 12.0–15.0)
Immature Granulocytes: 1 %
Lymphocytes Relative: 9 %
Lymphs Abs: 2 10*3/uL (ref 0.7–4.0)
MCH: 32.7 pg (ref 26.0–34.0)
MCHC: 32.5 g/dL (ref 30.0–36.0)
MCV: 100.4 fL — ABNORMAL HIGH (ref 80.0–100.0)
Monocytes Absolute: 2.5 10*3/uL — ABNORMAL HIGH (ref 0.1–1.0)
Monocytes Relative: 11 %
Neutro Abs: 16.2 10*3/uL — ABNORMAL HIGH (ref 1.7–7.7)
Neutrophils Relative %: 70 %
Platelets: 435 10*3/uL — ABNORMAL HIGH (ref 150–400)
RBC: 2.48 MIL/uL — ABNORMAL LOW (ref 3.87–5.11)
RDW: 13.5 % (ref 11.5–15.5)
WBC: 22.9 10*3/uL — ABNORMAL HIGH (ref 4.0–10.5)
nRBC: 0 % (ref 0.0–0.2)

## 2023-01-06 LAB — COMPREHENSIVE METABOLIC PANEL
ALT: 10 U/L (ref 0–44)
AST: 11 U/L — ABNORMAL LOW (ref 15–41)
Albumin: 2 g/dL — ABNORMAL LOW (ref 3.5–5.0)
Alkaline Phosphatase: 122 U/L (ref 38–126)
Anion gap: 5 (ref 5–15)
BUN: 15 mg/dL (ref 8–23)
CO2: 31 mmol/L (ref 22–32)
Calcium: 7.9 mg/dL — ABNORMAL LOW (ref 8.9–10.3)
Chloride: 95 mmol/L — ABNORMAL LOW (ref 98–111)
Creatinine, Ser: 0.36 mg/dL — ABNORMAL LOW (ref 0.44–1.00)
GFR, Estimated: >60 mL/min (ref >60)
Glucose, Bld: 127 mg/dL — ABNORMAL HIGH (ref 70–99)
Potassium: 3.5 mmol/L (ref 3.5–5.1)
Sodium: 131 mmol/L — ABNORMAL LOW (ref 135–145)
Total Bilirubin: 0.3 mg/dL (ref <1.2)
Total Protein: 4.7 g/dL — ABNORMAL LOW (ref 6.5–8.1)

## 2023-01-06 LAB — MAGNESIUM: Magnesium: 1.7 mg/dL (ref 1.7–2.4)

## 2023-01-06 SURGERY — ESOPHAGOGASTRODUODENOSCOPY (EGD) WITH PROPOFOL
Anesthesia: Monitor Anesthesia Care

## 2023-01-06 MED ORDER — KCL IN DEXTROSE-NACL 40-5-0.9 MEQ/L-%-% IV SOLN
INTRAVENOUS | Status: AC
  Filled 2023-01-06 (×3): qty 1000

## 2023-01-06 MED ORDER — HALOPERIDOL LACTATE 5 MG/ML IJ SOLN
1.0000 mg | Freq: Four times a day (QID) | INTRAMUSCULAR | Status: DC | PRN
  Administered 2023-01-06: 1 mg via INTRAVENOUS
  Filled 2023-01-06: qty 1

## 2023-01-06 MED ORDER — SODIUM CHLORIDE 0.9 % IV SOLN: INTRAVENOUS | Status: DC | PRN

## 2023-01-06 MED ORDER — FENTANYL CITRATE (PF) 250 MCG/5ML IJ SOLN
INTRAMUSCULAR | Status: DC | PRN
  Administered 2023-01-06 (×2): 12.5 ug via INTRAVENOUS

## 2023-01-06 MED ORDER — MELATONIN 5 MG PO TABS: 5.0000 mg | ORAL_TABLET | Freq: Every evening | ORAL | Status: DC | PRN

## 2023-01-06 MED ORDER — PHENYLEPHRINE HCL-NACL 20-0.9 MG/250ML-% IV SOLN
INTRAVENOUS | Status: DC | PRN
  Administered 2023-01-06: 20 ug/min via INTRAVENOUS

## 2023-01-06 MED ORDER — PHENYLEPHRINE HCL (PRESSORS) 10 MG/ML IV SOLN
INTRAVENOUS | Status: AC
  Filled 2023-01-06: qty 1

## 2023-01-06 MED ORDER — FENTANYL CITRATE (PF) 100 MCG/2ML IJ SOLN
INTRAMUSCULAR | Status: AC
  Filled 2023-01-06: qty 2

## 2023-01-06 MED ORDER — LIDOCAINE 2% (20 MG/ML) 5 ML SYRINGE
INTRAMUSCULAR | Status: DC | PRN
  Administered 2023-01-06: 20 mg via INTRAVENOUS

## 2023-01-06 MED ORDER — PROPOFOL 10 MG/ML IV BOLUS
INTRAVENOUS | Status: DC | PRN
  Administered 2023-01-06: 50 mg via INTRAVENOUS

## 2023-01-06 MED ORDER — ROCURONIUM BROMIDE 10 MG/ML (PF) SYRINGE
PREFILLED_SYRINGE | INTRAVENOUS | Status: DC | PRN
  Administered 2023-01-06: 40 mg via INTRAVENOUS

## 2023-01-06 MED ORDER — PROPOFOL 500 MG/50ML IV EMUL
INTRAVENOUS | Status: DC | PRN
  Administered 2023-01-06: 50 mg via INTRAVENOUS
  Administered 2023-01-06: 20 ug/kg/min via INTRAVENOUS

## 2023-01-06 MED ORDER — LACTATED RINGERS IV SOLN: INTRAVENOUS | Status: DC | PRN

## 2023-01-06 MED ORDER — SUGAMMADEX SODIUM 200 MG/2ML IV SOLN
INTRAVENOUS | Status: DC | PRN
  Administered 2023-01-06 (×2): 200 mg via INTRAVENOUS

## 2023-01-06 MED ORDER — PHENYLEPHRINE HCL (PRESSORS) 10 MG/ML IV SOLN
INTRAVENOUS | Status: DC | PRN
  Administered 2023-01-06: 200 ug via INTRAVENOUS
  Administered 2023-01-06 (×3): 80 ug via INTRAVENOUS
  Administered 2023-01-06: 160 ug via INTRAVENOUS

## 2023-01-06 SURGICAL SUPPLY — 14 items

## 2023-01-06 NOTE — Progress Notes (Signed)
Patient was observed to be anxious and confuse, her dose of Ativan was given but she later pulled out her NG Tube . On Call made aware.

## 2023-01-06 NOTE — H&P (View-Only) (Signed)
Patient ID: Kathryn Underwood, female   DOB: 03-25-1960, 62 y.o.   MRN: [id number]    Progress Note   Subjective   Day # 3 CC; new diagnosis of pancreatic cancer with outlet obstruction, malnutrition, weakness, SIRS  IV Unasyn  Labs today-WBC 22.9/hemoglobin 8.1/hematocrit 24.9 Sodium 131/potassium 3.5/BUN 15/creatinine 0.36 Albumin 2 LFTs within normal limits Blood cultures no growth  Patient pulled out the NG tube last p.m.  Significant other at bedside, he says that she called him in the middle of the night very confused, she is still confused this morning, somewhat restless, intermittently will say something appropriate.  I do not think she is able to sign consent for herself today.  Her son Kathryn Underwood is also at bedside this morning. They are waiting for palliative care meeting this morning, however her son says that he wants to do what ever is necessary to be able to spend more time with his mother and thinks that she wants the same.  He has signed her consent for the procedure today.  Long conversation with them regarding current severity of her illness, and hurdles that she will need to overcome before even being able to start chemotherapy and low likelihood that she will tolerate chemo. We reviewed significant anesthesia risks with procedure, and 10% risk of perforation with duodenal stent placement.  They are aware that if a perforation would occur she would require surgery, and would have a very difficult time surviving surgery.  Her significant other is open to the idea of hospice care when that becomes necessary and [facility]. We also discussed that if duodenal stent is placed that would only allow her to tolerate liquids, and if she cannot tolerate liquids may still require a feeding tube placement into jejunum.   Objective   Vital signs in last 24 hours: Temp:  [97.5 F (36.4 C)-98.6 F (37 C)] 97.5 F (36.4 C) (12/11 0607) Pulse Rate:  [92-97] 97 (12/11 0607) Resp:  [19-20]  20 (12/11 4098) BP: (99-109)/(58-72) 99/58 (12/11 1191) SpO2:  [93 %-100 %] 100 % (12/11 0607) Last BM Date : 01/05/23 General:   Acute and chronically ill appearing white female in NAD alert, somewhat restless mentally confused not participating in conversation- productive cough this morning Heart:  Regular rate and rhythm; no murmurs Lungs: Respirations even and unlabored, creased breath sounds bilaterally, left greater than right Abdomen:  Soft, nontender and nondistended. Normal bowel sounds.  Cholecystostomy tube with bilious effluent Extremities:  Without edema. Neurologic:  Alert and oriented x 2, intermittent confusion and restlessness y. Psych:  Cooperative.   Intake/Output from previous day: 12/10 0701 - 12/11 0700 In: 1846.8 [P.O.:220; I.V.:1325; IV Piggyback:301.8] Out: 1000 [Urine:700; Drains:300] Intake/Output this shift: No intake/output data recorded.  Lab Results: Recent Labs    01/04/23 1150 01/05/23 0027 01/06/23 0333  WBC 32.3* 32.9* 22.9*  HGB 9.3* 8.6* 8.1*  HCT 26.7* 26.0* 24.9*  PLT 468* 443* 435*   BMET Recent Labs    01/04/23 1859 01/05/23 0026 01/06/23 0333  NA 128* 128* 131*  K 2.7* 3.3* 3.5  CL 84* 86* 95*  CO2 34* 35* 31  GLUCOSE 113* 105* 127*  BUN 30* 29* 15  CREATININE 0.33* 0.35* 0.36*  CALCIUM 8.0* 7.9* 7.9*   LFT Recent Labs    01/06/23 0333  PROT 4.7*  ALBUMIN 2.0*  AST 11*  ALT 10  ALKPHOS 122  BILITOT 0.3   PT/INR No results for input(s): "LABPROT", "INR" in the last 72 hours.  Studies/Results: DG Abd Portable 1V  Result Date: 01/04/2023 CLINICAL DATA:  Nasogastric tube placement. EXAM: PORTABLE ABDOMEN - 1 VIEW COMPARISON:  Portable chest dated 01/03/2023 and abdomen and pelvis CT dated 01/03/2023. FINDINGS: Nasogastric tube side hole just distal to the gastroesophageal junction and tip in the proximal to mid stomach. With left jugular catheter tip at the superior cavoatrial junction. Resolved left basilar  atelectasis. The included bowel-gas pattern is unremarkable. IMPRESSION: Nasogastric tube tip in the proximal to mid stomach. Electronically Signed   By: Beckie Salts M.D.   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  Her son wants to proceed with what ever is necessary to give her a chance to improve her situation so that they can have more time together.  He is in favor of proceeding with EGD and duodenal stent placement, and possible aspiration of pancreatic fluid collection. They are aware of significant risks with the procedure as outlined above. Patient is currently scheduled for 1230 today with Dr. Meridee Score -we will plan to proceed unless we hear otherwise after palliative care conversations.    Principal Problem:    Hyponatremia Active Problems:   Pancreatic pseudocyst   Protein-calorie malnutrition, severe   Partial gastric outlet obstruction   Pancreatic adenocarcinoma (HCC)   SIRS (systemic inflammatory response syndrome) (HCC)   Overlapping malignant neoplasm of pancreas (HCC)   Failure to thrive (child)   Gastric outlet obstruction     LOS: 3 days   Laneka Mcgrory PA-C 01/06/2023, 8:36 AM

## 2023-01-06 NOTE — Transfer of Care (Signed)
Immediate Anesthesia Transfer of Care Note  Patient: Kathryn Underwood  Procedure(s) Performed: ESOPHAGOGASTRODUODENOSCOPY (EGD) WITH PROPOFOL FULL UPPER ENDOSCOPIC ULTRASOUND (EUS) RADIAL DUODENAL STENT PLACEMENT FINE NEEDLE ASPIRATION  Patient Location: PACU  Anesthesia Type:General  Level of Consciousness: awake, alert , and oriented  Airway & Oxygen Therapy: Patient Spontanous Breathing and Patient connected to face mask oxygen  Post-op Assessment: Report given to RN and Post -op Vital signs reviewed and stable  Post vital signs: Reviewed and stable  Last Vitals:  Vitals Value Taken Time  BP 92/58 01/06/23 1521  Temp 36.4 C 01/06/23 1518  Pulse 96 01/06/23 1522  Resp 33 01/06/23 1522  SpO2 100 % 01/06/23 1522  Vitals shown include unfiled device data.  Last Pain:  Vitals:   01/06/23 1518  TempSrc: Temporal  PainSc: 0-No pain      Patients Stated Pain Goal: 2 (01/05/23 1251)  Complications: No notable events documented.

## 2023-01-06 NOTE — Progress Notes (Signed)
    Patient Name: Kathryn Underwood           DOB: Sep 24, 1960  MRN: [id number]      Admission Date: 01/03/2023  Attending Provider: Glade Lloyd, MD  Primary Diagnosis: Hyponatremia   Level of care: Progressive    CROSS COVER NOTE   Date of Service   01/06/2023   Kathryn Underwood, 62 y.o. female, was admitted on 01/03/2023 for Hyponatremia.    HPI/Events of Note   Increased agitation, restlessness, confusion RN reports patient has become more confused and restless since coming back from EGD procedure.   Currently attempting to get out of bed multiple times.  High fall risk.   Unfortunately, she is not following commands despite multiple attempts at redirection by staff and family.  Patient has already received IV Haldol for agitation and morphine for pain.     Interventions/ Plan   Melatonin has also been ordered to assist with sleep cycle. Restraint use due to patient's level of agitation and high fall risk. Restraint to be discontinued as soon as patient is able to safely have them removed.        Anthoney Harada, DNP, ACNPC- AG [facility]

## 2023-01-06 NOTE — Progress Notes (Signed)
Patient ID: Kathryn Underwood, female   DOB: 03-25-1960, 62 y.o.   MRN: [id number]    Progress Note   Subjective   Day # 3 CC; new diagnosis of pancreatic cancer with outlet obstruction, malnutrition, weakness, SIRS  IV Unasyn  Labs today-WBC 22.9/hemoglobin 8.1/hematocrit 24.9 Sodium 131/potassium 3.5/BUN 15/creatinine 0.36 Albumin 2 LFTs within normal limits Blood cultures no growth  Patient pulled out the NG tube last p.m.  Significant other at bedside, he says that she called him in the middle of the night very confused, she is still confused this morning, somewhat restless, intermittently will say something appropriate.  I do not think she is able to sign consent for herself today.  Her son Kathryn Underwood is also at bedside this morning. They are waiting for palliative care meeting this morning, however her son says that he wants to do what ever is necessary to be able to spend more time with his mother and thinks that she wants the same.  He has signed her consent for the procedure today.  Long conversation with them regarding current severity of her illness, and hurdles that she will need to overcome before even being able to start chemotherapy and low likelihood that she will tolerate chemo. We reviewed significant anesthesia risks with procedure, and 10% risk of perforation with duodenal stent placement.  They are aware that if a perforation would occur she would require surgery, and would have a very difficult time surviving surgery.  Her significant other is open to the idea of hospice care when that becomes necessary and [facility]. We also discussed that if duodenal stent is placed that would only allow her to tolerate liquids, and if she cannot tolerate liquids may still require a feeding tube placement into jejunum.   Objective   Vital signs in last 24 hours: Temp:  [97.5 F (36.4 C)-98.6 F (37 C)] 97.5 F (36.4 C) (12/11 0607) Pulse Rate:  [92-97] 97 (12/11 0607) Resp:  [19-20]  20 (12/11 4098) BP: (99-109)/(58-72) 99/58 (12/11 1191) SpO2:  [93 %-100 %] 100 % (12/11 0607) Last BM Date : 01/05/23 General:   Acute and chronically ill appearing white female in NAD alert, somewhat restless mentally confused not participating in conversation- productive cough this morning Heart:  Regular rate and rhythm; no murmurs Lungs: Respirations even and unlabored, creased breath sounds bilaterally, left greater than right Abdomen:  Soft, nontender and nondistended. Normal bowel sounds.  Cholecystostomy tube with bilious effluent Extremities:  Without edema. Neurologic:  Alert and oriented x 2, intermittent confusion and restlessness y. Psych:  Cooperative.   Intake/Output from previous day: 12/10 0701 - 12/11 0700 In: 1846.8 [P.O.:220; I.V.:1325; IV Piggyback:301.8] Out: 1000 [Urine:700; Drains:300] Intake/Output this shift: No intake/output data recorded.  Lab Results: Recent Labs    01/04/23 1150 01/05/23 0027 01/06/23 0333  WBC 32.3* 32.9* 22.9*  HGB 9.3* 8.6* 8.1*  HCT 26.7* 26.0* 24.9*  PLT 468* 443* 435*   BMET Recent Labs    01/04/23 1859 01/05/23 0026 01/06/23 0333  NA 128* 128* 131*  K 2.7* 3.3* 3.5  CL 84* 86* 95*  CO2 34* 35* 31  GLUCOSE 113* 105* 127*  BUN 30* 29* 15  CREATININE 0.33* 0.35* 0.36*  CALCIUM 8.0* 7.9* 7.9*   LFT Recent Labs    01/06/23 0333  PROT 4.7*  ALBUMIN 2.0*  AST 11*  ALT 10  ALKPHOS 122  BILITOT 0.3   PT/INR No results for input(s): "LABPROT", "INR" in the last 72 hours.  Studies/Results: DG Abd Portable 1V  Result Date: 01/04/2023 CLINICAL DATA:  Nasogastric tube placement. EXAM: PORTABLE ABDOMEN - 1 VIEW COMPARISON:  Portable chest dated 01/03/2023 and abdomen and pelvis CT dated 01/03/2023. FINDINGS: Nasogastric tube side hole just distal to the gastroesophageal junction and tip in the proximal to mid stomach. With left jugular catheter tip at the superior cavoatrial junction. Resolved left basilar  atelectasis. The included bowel-gas pattern is unremarkable. IMPRESSION: Nasogastric tube tip in the proximal to mid stomach. Electronically Signed   By: Beckie Salts M.D.   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  Her son wants to proceed with what ever is necessary to give her a chance to improve her situation so that they can have more time together.  He is in favor of proceeding with EGD and duodenal stent placement, and possible aspiration of pancreatic fluid collection. They are aware of significant risks with the procedure as outlined above. Patient is currently scheduled for 1230 today with Dr. Meridee Score -we will plan to proceed unless we hear otherwise after palliative care conversations.    Principal Problem:    Hyponatremia Active Problems:   Pancreatic pseudocyst   Protein-calorie malnutrition, severe   Partial gastric outlet obstruction   Pancreatic adenocarcinoma (HCC)   SIRS (systemic inflammatory response syndrome) (HCC)   Overlapping malignant neoplasm of pancreas (HCC)   Failure to thrive (child)   Gastric outlet obstruction     LOS: 3 days   Laneka Mcgrory PA-C 01/06/2023, 8:36 AM

## 2023-01-06 NOTE — Op Note (Addendum)
[facility] Patient Name: Kathryn Underwood Procedure Date: 01/06/2023 MRN: [id number] Attending MD: Corliss Parish , MD, [id number] Date of Birth: [birth year] CSN: [id number] Age: 62 Admit Type: Inpatient Procedure:                Upper EUS Indications:              Pancreatic cyst on CT scan, Abnormal                            abdominal/pelvic CT scan, Endoscopy to confirm                            gastric obstruction that was demonstrated on                            previous imaging study, Nausea with vomiting Providers:                Corliss Parish, MD, Stephens Shire RN, RN,                            Kandice Robinsons, Technician Referring MD:             Malachy Mood, MD, inpatient medical service Medicines:                General Anesthesia, Patient is already on scheduled                            antibiotics so none given Complications:            No immediate complications. Estimated Blood Loss:     Estimated blood loss was minimal. Procedure:                Pre-Anesthesia Assessment:                           - Prior to the procedure, a History and Physical                            was performed, and patient medications and                            allergies were reviewed. The patient's tolerance of                            previous anesthesia was also reviewed. The risks                            and benefits of the procedure and the sedation                            options and risks were discussed with the patient.                            All questions were answered, and informed consent  was obtained. Prior Anticoagulants: The patient has                            taken Lovenox (enoxaparin), last dose was 1 day                            prior to procedure. ASA Grade Assessment: III - A                            patient with severe systemic disease. After                            reviewing the risks and  benefits, the patient was                            deemed in satisfactory condition to undergo the                            procedure.                           After obtaining informed consent, the endoscope was                            passed under direct vision. Throughout the                            procedure, the patient's blood pressure, pulse, and                            oxygen saturations were monitored continuously. The                            [id number] ([id number]) Olympus therapeutic endoscope                            was introduced through the mouth, and advanced to                            the second part of duodenum. The [id number]                            ([id number]) Olympus linear ultrasound scope was                            introduced through the mouth, and advanced to the                            stomach for ultrasound examination. The upper EUS                            was accomplished without difficulty. The patient  tolerated the procedure. Scope In: Scope Out: Findings:      ENDOSCOPIC FINDING: :      LA Grade D (one or more mucosal breaks involving at least 75% of       esophageal circumference) esophagitis with no bleeding was found in the       distal esophagus (though compared to prior, this looks slightly improved       and shorter only 8 cm).      A 2 cm hiatal hernia was present.      A distention deformity was found in the entire examined stomach.      An acquired intrinsic severe stenosis was found in the duodenal bulb       apex/duodenal sweep into the proximal second portion of the duodenum and       was traversed with the therapeutic endoscope with gentle pressure.       Placement of a 0.035 inch x 450 cm straight Hydra Jagwire was attempted.       This passed successfully deep into the small bowel. This was stented       with a 22 mm x 9 cm WallFlex stent under fluoroscopic and endoscopic        guidance.      No other gross lesions were noted in the second portion of the duodenum.      ENDOSONOGRAPHIC FINDING (performed after EGD therapeutics noted above): :      An anechoic lesion suggestive of a cyst was identified in the genu/body       of the pancreas. It is not in obvious communication with the pancreatic       duct. The lesion measured 55 mm by 39 mm in maximal cross-sectional       diameter. There was a single compartment thinly septated. The outer wall       of the lesion was thin. There was no associated mass. There was internal       debris within the fluid-filled cavity consistent with likely mild       sterile necrosis. Due to the patient's significant elevation in WBC,       only improving with antibiotic therapy, to rule out that the patient had       infected necrosis or pseudocyst, diagnostic and therapeutic needle       aspiration for fluid was performed. Color Doppler imaging was utilized       prior to needle puncture to confirm a lack of significant vascular       structures within the needle path. One pass was made with the [facility]       Scientific expect 22 gauge needle using a transgastric approach. A       stylet was used. The amount of fluid collected was 80 mL. The fluid was       cloudy, yellow and watery (not clearly infected). Sample(s) were sent       for bacterial cultures.      Endosonographic imaging in the visualized portion of the liver showed no       mass.      The celiac region was visualized. Impression:               EGD impression:                           - LA Grade D erosive esophagitis with no bleeding  noted distally (somewhat improved from prior                            endoscopic evaluation).                           - 2 cm hiatal hernia.                           -Distention deformity of the entire stomach.                           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                            Wire placed for visualization fluoroscopically. 22                            mm x 9 cm wallflex prosthesis placed.                           - No gross lesions in the rest of the second                            portion of the duodenum.                           EUS impression:                           - A cystic lesion was seen in the genu/body of the                            pancreas. Cytology results are pending. However,                            the endosonographic appearance is consistent with a                            pancreatic pseudocyst with small amount of walled                            off necrosis. Fine needle aspiration for fluid                            performed. Moderate Sedation:      Not Applicable - Patient had care per Anesthesia. Recommendation:           - The patient will be observed post-procedure,                            until all discharge criteria are met.                           -  Return patient to hospital ward for ongoing care.                           - Patient has a contact number available for                            emergencies. The signs and symptoms of potential                            delayed complications were discussed with the                            patient. Return to normal activities tomorrow.                            Written discharge instructions were provided to the                            patient.                           - Clear liquid diet for next 24 to 48 hours (when                            mental status is improved). If tolerates, then can                            transition to full liquid diet for 24 hours and                            then advance to low residue diet thereafter.                            IntraStent diet given to patient's family.                           - Observe patient's clinical course.                            - Follow-up culture data.                           - Continue current antibiotic therapy. Otherwise if                            patient is improving and if culture data is                            negative for infected necrosis, then allow medicine                            service to discuss timing of overall antibiotic  therapy as per their reasoning to continue. If                            infected necrosis is found, then 10 days of                            antibiotic therapy is reasonable with repeat                            imaging.                           - Hold on restart of DOAC for at least 48 hours. If                            anticoagulation needs are determined to be needed                            sooner, may start heparin drip at midnight on 12/12                            without bolus and monitor.                           - PPI twice daily IV while not taking orals and                            transition to oral as able. Carafate should also be                            restarted when able.                           - The findings and recommendations were discussed                            with the patient.                           - The findings and recommendations were discussed                            with the patient's family.                           - The findings and recommendations were discussed                            with the referring physician. Procedure Code(s):        --- Professional ---                           [phone], Esophagogastroduodenoscopy, flexible,  transoral; with transendoscopic ultrasound-guided                            intramural or transmural fine needle                            aspiration/biopsy(s), (includes endoscopic                            ultrasound examination limited to the esophagus,                            stomach or  duodenum, and adjacent structures)                           43266, Esophagogastroduodenoscopy, flexible,                            transoral; with placement of endoscopic stent                            (includes pre- and post-dilation and guide wire                            passage, when performed)                           [id number], Intraluminal dilation of strictures and/or                            obstructions (eg, esophagus), radiological                            supervision and interpretation Diagnosis Code(s):        --- Professional ---                           K20.80, Other esophagitis without bleeding                           K44.9, Diaphragmatic hernia without obstruction or                            gangrene                           K31.89, Other diseases of stomach and duodenum                           K31.5, Obstruction of duodenum                           K86.2, Cyst of pancreas                           R11.2, Nausea with vomiting, unspecified  R93.5, Abnormal findings on diagnostic imaging of                            other abdominal regions, including retroperitoneum                           R93.3, Abnormal findings on diagnostic imaging of                            other parts of digestive tract CPT copyright 2022 [employer] Association. All rights reserved. The codes documented in this report are preliminary and upon coder review may  be revised to meet current compliance requirements. Corliss Parish, MD 01/06/2023 3:16:18 PM Number of Addenda: 0

## 2023-01-06 NOTE — Progress Notes (Signed)
PROGRESS NOTE    Kathryn Underwood  HCW:[id number] DOB: 10/01/1960 DOA: 01/03/2023 PCP: Bary Leriche, PA-C   Brief Narrative:  62 year old female with previous alcohol induced pancreatitis, prior history of substance abuse, anxiety and depression, recent diagnosis of pancreatic adenocarcinoma and pancreatic pseudocyst during admission in November 2024 along with portal vein thrombosis along with cholecystostomy tube placement on 12/13/2022 with subsequent admission from 12/23/2022 to 12/27/2022 for partial gastric outlet obstruction and duodenal stenosis which was managed conservatively presented with worsening nausea, vomiting and weakness on presentation, her sodium was 119.  CT showed gastric outlet obstruction along with right lower lobe pneumonia.  WBCs of 31.  She was started on IV fluids and antibiotics.  GI/oncology/palliative care were consulted.  Assessment & Plan:   Gastric outlet obstruction, most likely from pancreatic malignancy and pseudocysts -Patient pulled out her NG tube last night.  GI following: Follow recommendations.  Patient might need EGD and duodenal stenting. -Continue n.p.o.  AKI -From dehydration, poor oral intake, nausea and vomiting -Renal function has improved.  On IV fluids  Possible aspiration pneumonia -Continue Unasyn  Leukocytosis -Improving.  Monitor  Thrombocytosis -Possibly reactive.  Monitor intermittently  Anemia of chronic disease -From cancer and chemotherapy  Recent diagnosis of pancreatic adenocarcinoma and pancreatic pseudocyst Portal vein thrombosis Goals of care -Oncology following.  Overall prognosis is guarded to poor.  If duodenal stent placement is not feasible, patient might need IR consultation for feeding tube placement. -Palliative care consultation pending -Hold Eliquis for possible GI intubated  Hyponatremia -Improving.  Monitor  Cholecystostomy previously placed on 12/13/2022 -Outpatient follow-up with IR.   Currently draining and functioning.  History of prior alcohol abuse Bipolar disorder -Continue Remeron and Seroquel.  Continue as needed Ativan  History of COPD -Currently stable.  Cancer related cachexia Severe malnutrition -Follow palliative care consult recommendations    DVT prophylaxis: Hold Eliquis for possible GI intervention  code Status: DNR Family Communication: Significant other and son at bedside Disposition Plan: Status is: Inpatient Remains inpatient appropriate because: Of severity of illness   Consultants: Oncology/GI/palliative care  Procedures: None Antimicrobials: Unasyn from 01/03/2023 onwards   Subjective: Patient seen and examined at bedside.  Poor historian.  Patient apparently pulled out NG tube overnight.  Required IV Ativan earlier this morning as per nursing staff.  No fever, seizures reported. Objective: Vitals:   01/05/23 0525 01/05/23 0953 01/05/23 2035 01/06/23 0607  BP: 100/76 102/72 109/63 (!) 99/58  Pulse:  94 92 97  Resp: 20 19 19 20   Temp: 98.5 F (36.9 C) 98.6 F (37 C) 98.2 F (36.8 C) (!) 97.5 F (36.4 C)  TempSrc: Oral Oral Oral Oral  SpO2: 91% 93% 100% 100%  Weight:      Height:        Intake/Output Summary (Last 24 hours) at 01/06/2023 1135 Last data filed at 01/06/2023 0700 Gross per 24 hour  Intake 1846.84 ml  Output 1000 ml  Net 846.84 ml   Filed Weights   01/03/23 1628  Weight: 45.4 kg    Examination:  General exam: Looks chronically ill and deconditioned.  On room air.  Extremity thinly built Respiratory system: Bilateral decreased breath sounds at bases with scattered crackles Cardiovascular system: S1 & S2 heard, Rate controlled Gastrointestinal system: Abdomen is nondistended, soft and nontender. Normal bowel sounds heard. Extremities: No cyanosis, clubbing, edema  Central nervous system: Awake.  Slow to respond.  Poor historian.  No focal neurological deficits. Moving extremities Skin: No rashes,  lesions  or ulcers Psychiatry: Flat affect.  Not agitated.   Data Reviewed: I have personally reviewed following labs and imaging studies  CBC: Recent Labs  Lab 01/03/23 1702 01/04/23 1150 01/05/23 0027 01/06/23 0333  WBC 31.0* 32.3* 32.9* 22.9*  NEUTROABS  --   --  27.4* 16.2*  HGB 11.9* 9.3* 8.6* 8.1*  HCT 34.3* 26.7* 26.0* 24.9*  MCV 94.5 95.4 98.9 100.4*  PLT 615* 468* 443* 435*   Basic Metabolic Panel: Recent Labs  Lab 01/04/23 0600 01/04/23 1150 01/04/23 1859 01/05/23 0026 01/06/23 0333  NA 124* 128* 128* 128* 131*  K 3.1* 2.9* 2.7* 3.3* 3.5  CL 79* 83* 84* 86* 95*  CO2 36* 36* 34* 35* 31  GLUCOSE 110* 111* 113* 105* 127*  BUN 43* 35* 30* 29* 15  CREATININE 0.55 0.48 0.33* 0.35* 0.36*  CALCIUM 8.1* 8.2* 8.0* 7.9* 7.9*  MG 2.3  --   --   --  1.7   GFR: Estimated Creatinine Clearance: 51.6 mL/min (A) (by C-G formula based on SCr of 0.36 mg/dL (L)). Liver Function Tests: Recent Labs  Lab 01/03/23 1850 01/04/23 0600 01/06/23 0333  AST 17 14* 11*  ALT 10 9 10   ALKPHOS 139* 112 122  BILITOT 0.4 0.5 0.3  PROT 5.4* 5.0* 4.7*  ALBUMIN 2.6* 2.3* 2.0*   Recent Labs  Lab 01/03/23 1850  LIPASE 43   Recent Labs  Lab 01/04/23 0600  AMMONIA 27   Coagulation Profile: No results for input(s): "INR", "PROTIME" in the last 168 hours. Cardiac Enzymes: No results for input(s): "CKTOTAL", "CKMB", "CKMBINDEX", "TROPONINI" in the last 168 hours. BNP (last 3 results) No results for input(s): "PROBNP" in the last 8760 hours. HbA1C: No results for input(s): "HGBA1C" in the last 72 hours. CBG: No results for input(s): "GLUCAP" in the last 168 hours. Lipid Profile: No results for input(s): "CHOL", "HDL", "LDLCALC", "TRIG", "CHOLHDL", "LDLDIRECT" in the last 72 hours. Thyroid Function Tests: No results for input(s): "TSH", "T4TOTAL", "FREET4", "T3FREE", "THYROIDAB" in the last 72 hours. Anemia Panel: No results for input(s): "VITAMINB12", "FOLATE", "FERRITIN",  "TIBC", "IRON", "RETICCTPCT" in the last 72 hours. Sepsis Labs: Recent Labs  Lab 01/03/23 1716 01/03/23 1949  LATICACIDVEN 2.8* 1.1    Recent Results (from the past 240 hour(s))  Blood culture (routine x 2)     Status: None (Preliminary result)   Collection Time: 01/03/23  6:33 PM   Specimen: BLOOD  Result Value Ref Range Status   Specimen Description   Final    BLOOD PORTA CATH Performed at [facility], [address], [place], [place] [address]    Special Requests   Final    BOTTLES DRAWN AEROBIC AND ANAEROBIC Blood Culture adequate volume Performed at [facility], [address], [place], [place] [address]    Culture   Final    NO GROWTH 3 DAYS Performed at [facility], [address], [place], [place] [address]    Report Status PENDING  Incomplete  Blood culture (routine x 2)     Status: None (Preliminary result)   Collection Time: 01/03/23  6:38 PM   Specimen: BLOOD  Result Value Ref Range Status   Specimen Description   Final    BLOOD LEFT ANTECUBITAL Performed at [facility], [address], [place], [place] [address]    Special Requests   Final    BOTTLES DRAWN AEROBIC AND ANAEROBIC Blood Culture adequate volume Performed at [facility], [address] [place], [place] [address]    Culture   Final    NO GROWTH 3 DAYS Performed at [facility], [address], [place], [place] [address]    Report Status PENDING  Incomplete         Radiology Studies: DG Abd Portable 1V  Result Date: 01/04/2023 CLINICAL DATA:  Nasogastric tube placement. EXAM: PORTABLE ABDOMEN - 1 VIEW COMPARISON:  Portable chest dated 01/03/2023 and abdomen and pelvis CT dated 01/03/2023. FINDINGS: Nasogastric tube side hole just distal to the gastroesophageal junction and tip in the proximal to mid stomach. With left jugular catheter tip at the superior cavoatrial junction. Resolved left basilar atelectasis. The included  bowel-gas pattern is unremarkable. IMPRESSION: Nasogastric tube tip in the proximal to mid stomach. Electronically Signed   By: Beckie Salts M.D.   On: 01/04/2023 17:29        Scheduled Meds:  Chlorhexidine Gluconate Cloth  6 each Topical Daily   mirtazapine  45 mg Oral QHS   pantoprazole (PROTONIX) IV  40 mg Intravenous Q12H   QUEtiapine  200 mg Oral QHS   sucralfate  1 g Oral TID WC & HS   thiamine (VITAMIN B1) injection  100 mg Intravenous Daily   Continuous Infusions:  ampicillin-sulbactam (UNASYN) IV 3 g (01/06/23 0559)   dextrose 5 % and 0.9 % NaCl with KCl 40 mEq/L 75 mL/hr at 01/05/23 1220          Jaquay Posthumus, MD [facility] 01/06/2023, 11:35 AM

## 2023-01-06 NOTE — Interval H&P Note (Signed)
History and Physical Interval Note:  01/06/2023 1:50 PM  Kathryn Underwood  has presented today for surgery, with the diagnosis of pancreatic cancer.  The various methods of treatment have been discussed with the patient and family. After consideration of risks, benefits and other options for treatment, the patient has consented to  Procedure(s) with comments: ESOPHAGOGASTRODUODENOSCOPY (EGD) WITH PROPOFOL (N/A) - with duodenal stent placement FULL UPPER ENDOSCOPIC ULTRASOUND (EUS) RADIAL (N/A) DUODENAL STENT PLACEMENT (N/A) as a surgical intervention.  The patient's history has been reviewed, patient examined, no change in status, stable for surgery.  I have reviewed the patient's chart and labs.  Questions were answered to the patient's satisfaction.     [employer]

## 2023-01-06 NOTE — Consult Note (Signed)
Consultation Note Date: 01/06/2023   Patient Name: Kathryn Underwood  DOB: [birth year]  MRN: [id number]  Age / Sex: 62 y.o., female  PCP: Allwardt, Crist Infante, PA-C Referring Physician: Glade Lloyd, MD  Reason for Consultation: Establishing goals of care  HPI/Patient Profile: 62 y.o. female admitted on 01/03/2023  62 year old home dwelling white female  pancreatic adenocarcinoma cT3 N0 M0 diagnosed 11/2022 in the setting of prior pancreatitis with pseudocyst EtOH Bipolar Hospitalization 11/12--11/22 fatigue intentional weight loss imaging = pancreatic neoplasm-EGD EUS 11/16 caused abdominal pain CT ABD GU 00 secondary to infiltrating pancreatic mass + ulcer-GB was distended-IR performed [facility] cholecystotomy 11/17-she was discharged on full liquid diet to follow-up in the outpatient setting and Port-A-Cath was placed    readmit 11/27 through 12/27/2022 epigastric pain nausea?  SIRS--treated empirically IV antibiotic recs no source obtained felt to be secondary to her esophagitis  discussion regarding nab-paclitaxel gemcitabine on 12/2 after outpatient follow-up Dr. Mosetta Putt was given IV fluids additionally   12/8 readmit N/V/weakness, increasing emesis with dark color Sodium 122 potassium 2.8 BUN/creatinine 55/0.6 ( 8/0.4), bicarb 38 WBC 31 platelet 615 hemoglobin 11.9 CXR?  LLL opacity atelectasis PNA CT ABD pelvis-significant stomach distention 2/2 partial gastric outlet obstruction level of duodenum, pancreatic mass liver lesions RUQ without significant change primary pancreatic lesions suspected to be pseudocysts unchanged 12/10 GI consulted--planning for endoscopy EGD + fluoroscopy 2/2 impending gastric outlet obstruction.  Clinical Assessment and Goals of Care: Palliative consult has been requested for goals of care discussions. Chart reviewed Patient seen and examined Son and significant other present at  bedside.  Patient has a son and her daughter.  Patient has been with his significant other for over 30 years.  Patient has recently been diagnosed with pancreatic cancer.  She has been more confused overnight and this a.m. Goals of care discussions undertaken.  Patient awakens and answers a few questions appropriately but is not able to participate fully in goals of care discussions. Palliative medicine is specialized medical care for people living with serious illness. It focuses on providing relief from the symptoms and stress of a serious illness. The goal is to improve quality of life for both the patient and the family. Goals of care: Broad aims of medical therapy in relation to the patient's values and preferences. Our aim is to provide medical care aimed at enabling patients to achieve the goals that matter most to them, given the circumstances of their particular medical situation and their constraints.    NEXT OF KIN  Son and significant other Son Chelsye Frew [phone] Significant other/Fiance Mr Ward.   SUMMARY OF RECOMMENDATIONS   Goals of care discussions: CODE STATUS of DNR/DNI reviewed discussed and agreed upon to continue.  At end-of-life, patient does not desire aggressive artificial resuscitative efforts. Continue current mode of care, consider interventions that are being planned such as EGD, possible stent placement for gastric outlet obstruction. Discussed frankly but compassionately with patient, son and significant other about the serious nature of the patient's illness and  the possibility of a markedly limited prognosis.  At present, the goals are not for comfort-focused care. Palliative team to continue to follow along.  Code Status/Advance Care Planning: DNR   Symptom Management:     Palliative Prophylaxis:  Delirium Protocol  Additional Recommendations (Limitations, Scope, Preferences): Full Scope Treatment  Psycho-social/Spiritual:  Desire for further  Chaplaincy support:yes Additional Recommendations: Caregiving  Support/Resources  Prognosis:  Unable to determine  Discharge Planning: To Be Determined      Primary Diagnoses: Present on Admission:  Pancreatic adenocarcinoma (HCC)  SIRS (systemic inflammatory response syndrome) (HCC)  Pancreatic pseudocyst  Hyponatremia  Partial gastric outlet obstruction  Protein-calorie malnutrition, severe   I have reviewed the medical record, interviewed the patient and family, and examined the patient. The following aspects are pertinent.  Past Medical History:  Diagnosis Date   Alcohol abuse    Anxiety    Chickenpox    Depression    Neuromuscular disorder (HCC)    neuropathy   Pancreatic cancer (HCC)    Substance abuse (HCC)    Social History   Socioeconomic History   Marital status: Divorced    Spouse name: Not on file   Number of children: Not on file   Years of education: Not on file   Highest education level: Not on file  Occupational History   Not on file  Tobacco Use   Smoking status: Some Days    Current packs/day: 0.50    Types: Cigarettes   Smokeless tobacco: Never  Vaping Use   Vaping status: Never Used  Substance and Sexual Activity   Alcohol use: Not Currently    Alcohol/week: 16.0 standard drinks of alcohol    Types: 16 Glasses of wine per week    Comment: daily (1.5 liter bottle wine every 12)   Drug use: Not Currently    Types: Cocaine, Marijuana   Sexual activity: Yes    Partners: Male    Comment: female partner  Other Topics Concern   Not on file  Social History Narrative   Divorced. Homemaker. HS grad.    Lives with life partner (stan)   Drinks caffeine.   Wears seatbelt. Smoke dectector in the home. No firearms in the home.    Feels safe in her relationships.     Social Determinants of Health   Financial Resource Strain: Not on file  Food Insecurity: No Food Insecurity (01/03/2023)   Hunger Vital Sign    Worried About Running Out of Food  in the Last Year: Never true    Ran Out of Food in the Last Year: Never true  Transportation Needs: No Transportation Needs (01/03/2023)   PRAPARE - Administrator, Civil Service (Medical): No    Lack of Transportation (Non-Medical): No  Physical Activity: Not on file  Stress: Not on file  Social Connections: Not on file   Family History  Problem Relation Age of Onset   Dementia Mother    Arthritis Father    Asthma Father    Asthma Brother    Diabetes Maternal Grandmother    Dementia Maternal Grandmother    Diabetes Paternal Grandmother    Parkinson's disease Maternal Grandfather    Parkinson's disease Paternal Grandfather    Breast cancer Neg Hx    Colon cancer Neg Hx    Scheduled Meds:  Chlorhexidine Gluconate Cloth  6 each Topical Daily   mirtazapine  45 mg Oral QHS   pantoprazole (PROTONIX) IV  40 mg Intravenous Q12H  QUEtiapine  200 mg Oral QHS   sucralfate  1 g Oral TID WC & HS   thiamine (VITAMIN B1) injection  100 mg Intravenous Daily   Continuous Infusions:  ampicillin-sulbactam (UNASYN) IV 3 g (01/06/23 0559)   dextrose 5 % and 0.9 % NaCl with KCl 40 mEq/L 75 mL/hr at 01/05/23 1220   PRN Meds:.LORazepam, morphine injection, ondansetron **OR** ondansetron (ZOFRAN) IV, sodium chloride flush Medications Prior to Admission:  Prior to Admission medications   Medication Sig Start Date End Date Taking? Authorizing Provider  acetaminophen (TYLENOL) 500 MG tablet Take 500-1,000 mg by mouth every 6 (six) hours as needed for moderate pain (pain score 4-6).   Yes [provider]  APIXABAN Everlene Balls) VTE STARTER PACK (10MG  AND 5MG ) Take as directed on package: start with two-5mg  tablets twice daily for 7 days. On day 8, switch to one-5mg  tablet twice daily. Patient taking differently: Take 5 mg by mouth 2 (two) times daily. Take as directed on package: start with two-5mg  tablets twice daily for 7 days. On day 8, switch to one-5mg  tablet twice daily. 12/18/22   Yes Almon Hercules, MD  Ensure Max Protein (ENSURE MAX PROTEIN) LIQD Take 330 mLs (11 oz total) by mouth 2 (two) times daily. 12/18/22  Yes Almon Hercules, MD  gabapentin (NEURONTIN) 300 MG capsule Take 2 capsules (600 mg total) by mouth 2 (two) times daily. Patient taking differently: Take 900 mg by mouth at bedtime. 10/09/17  Yes Rolan Bucco, MD  lidocaine-prilocaine (EMLA) cream Apply to affected area once 12/28/22  Yes Malachy Mood, MD  mirtazapine (REMERON) 45 MG tablet Take 45 mg by mouth at bedtime. 11/05/21  Yes [provider]  ondansetron (ZOFRAN) 8 MG tablet Take 1 tablet (8 mg total) by mouth every 8 (eight) hours as needed for nausea or vomiting. 12/28/22  Yes Malachy Mood, MD  oxyCODONE (OXY IR/ROXICODONE) 5 MG immediate release tablet Take 1 tablet (5 mg total) by mouth every 4 (four) hours as needed for moderate pain (pain score 4-6). 12/27/22  Yes Alwyn Ren, MD  pantoprazole (PROTONIX) 40 MG tablet Take 1 tablet (40 mg total) by mouth 2 (two) times daily. 12/27/22  Yes Alwyn Ren, MD  prochlorperazine (COMPAZINE) 10 MG tablet Take 1 tablet (10 mg total) by mouth every 6 (six) hours as needed for nausea or vomiting. 12/28/22  Yes Malachy Mood, MD  QUEtiapine (SEROQUEL) 100 MG tablet Take 200 mg by mouth at bedtime.   Yes [provider]  sucralfate (CARAFATE) 1 GM/10ML suspension Take 10 mLs (1 g total) by mouth 4 (four) times daily -  with meals and at bedtime. 12/23/22  Yes Allwardt, Alyssa M, PA-C  Vitamin D, Ergocalciferol, (DRISDOL) 1.25 MG (50000 UNIT) CAPS capsule Take 1 capsule (50,000 Units total) by mouth every 7 (seven) days. 01/02/23  Yes Alwyn Ren, MD  apixaban (ELIQUIS) 5 MG TABS tablet Take 2 tablets (10 mg total) by mouth 2 (two) times daily for 7 days, THEN 1 tablet (5 mg total) 2 (two) times daily for 21 days. Patient not taking: Reported on 01/04/2023 12/18/22 01/15/23  Almon Hercules, MD  sodium chloride flush (NS) 0.9 % SOLN Use 5 -  10 mLs to flush abdominal drain once daily as directed 12/18/22   Allred, Darrell K, PA-C   No Known Allergies Review of Systems +weakness  Physical Exam Cachectic appearing Restless Has cholecystostomy drain Does not have edema Has some muscle wasting S1-S2 Regular work of  breathing  Vital Signs: BP (!) 99/58 (BP Location: Left Arm)   Pulse 97   Temp (!) 97.5 F (36.4 C) (Oral)   Resp 20   Ht 5\' 3"  (1.6 m)   Wt 45.4 kg   SpO2 100%   BMI 17.71 kg/m  Pain Scale: 0-10   Pain Score: 0-No pain   SpO2: SpO2: 100 % O2 Device:SpO2: 100 % O2 Flow Rate: .O2 Flow Rate (L/min): 4 L/min  IO: Intake/output summary:  Intake/Output Summary (Last 24 hours) at 01/06/2023 1039 Last data filed at 01/06/2023 0700 Gross per 24 hour  Intake 1846.84 ml  Output 1000 ml  Net 846.84 ml    LBM: Last BM Date : 01/05/23 Baseline Weight: Weight: 45.4 kg Most recent weight: Weight: 45.4 kg     Palliative Assessment/Data:   PPS 40%  Time In:  9.30 Time Out:  10.30 Time Total:  60  Greater than 50%  of this time was spent counseling and coordinating care related to the above assessment and plan.  Signed by: Rosalin Hawking, MD   Please contact Palliative Medicine Team phone at [phone] for questions and concerns.  For individual provider: See Loretha Stapler

## 2023-01-06 NOTE — Progress Notes (Signed)

## 2023-01-06 NOTE — Anesthesia Preprocedure Evaluation (Addendum)
Anesthesia Evaluation  Patient identified by MRN, date of birth, ID band Patient confused    Reviewed: Allergy & Precautions, NPO status , Patient's Chart, lab work & pertinent test results  History of Anesthesia Complications Negative for: history of anesthetic complications  Airway Mallampati: III  TM Distance: >3 FB Neck ROM: Full    Dental  (+) Dental Advisory Given, Chipped   Pulmonary COPD, Current Smoker and Patient abstained from smoking.   Pulmonary exam normal        Cardiovascular negative cardio ROS  Rhythm:Regular Rate:Tachycardia     Neuro/Psych  PSYCHIATRIC DISORDERS Anxiety Depression    negative neurological ROS     GI/Hepatic ,GERD  Medicated and Controlled,,(+)     substance abuse  alcohol use Pancreatic cancer    Endo/Other   Na 131 Ca 7.9 Cl 95   Renal/GU negative Renal ROS     Musculoskeletal negative musculoskeletal ROS (+)    Abdominal   Peds  Hematology  (+) Blood dyscrasia, anemia  On eliquis    Anesthesia Other Findings   Reproductive/Obstetrics                             Anesthesia Physical Anesthesia Plan  ASA: 4  Anesthesia Plan: General   Post-op Pain Management: Minimal or no pain anticipated   Induction:   PONV Risk Score and Plan: 2 and Treatment may vary due to age or medical condition and TIVA  Airway Management Planned: Oral ETT  Additional Equipment: None  Intra-op Plan:   Post-operative Plan:   Informed Consent: I have reviewed the patients History and Physical, chart, labs and discussed the procedure including the risks, benefits and alternatives for the proposed anesthesia with the patient or authorized representative who has indicated his/her understanding and acceptance.   Patient has DNR.  Discussed DNR with power of attorney and Suspend DNR.   Consent reviewed with POA and Dental advisory given  Plan Discussed  with: CRNA, Anesthesiologist and Surgeon  Anesthesia Plan Comments:        Anesthesia Quick Evaluation

## 2023-01-06 NOTE — Progress Notes (Signed)
PT very restless. Hand mittens applied to keep patient from pulling at IV. PRN Medication (Ativan, morphine, Haldol) has not helped alleviate her symptoms of unrest. Patient is irritated yelling, pulling at line, Tele  monitor and constantly trying to get out of bed. Secure chat PM Hospitalist for order for belt restraints to maintain PT safety. Hospitalist said she will round on PT and access her at bedside.

## 2023-01-07 ENCOUNTER — Encounter (HOSPITAL_COMMUNITY): Payer: Self-pay | Admitting: Gastroenterology

## 2023-01-07 DIAGNOSIS — K863 Pseudocyst of pancreas: Secondary | ICD-10-CM | POA: Diagnosis not present

## 2023-01-07 DIAGNOSIS — C258 Malignant neoplasm of overlapping sites of pancreas: Secondary | ICD-10-CM | POA: Diagnosis not present

## 2023-01-07 DIAGNOSIS — R634 Abnormal weight loss: Secondary | ICD-10-CM | POA: Diagnosis not present

## 2023-01-07 DIAGNOSIS — E871 Hypo-osmolality and hyponatremia: Secondary | ICD-10-CM | POA: Diagnosis not present

## 2023-01-07 DIAGNOSIS — K315 Obstruction of duodenum: Secondary | ICD-10-CM | POA: Diagnosis not present

## 2023-01-07 LAB — COMPREHENSIVE METABOLIC PANEL
ALT: 9 U/L (ref 0–44)
AST: 10 U/L — ABNORMAL LOW (ref 15–41)
Albumin: 2 g/dL — ABNORMAL LOW (ref 3.5–5.0)
Alkaline Phosphatase: 133 U/L — ABNORMAL HIGH (ref 38–126)
Anion gap: 4 — ABNORMAL LOW (ref 5–15)
BUN: 7 mg/dL — ABNORMAL LOW (ref 8–23)
CO2: 28 mmol/L (ref 22–32)
Calcium: 7.7 mg/dL — ABNORMAL LOW (ref 8.9–10.3)
Chloride: 100 mmol/L (ref 98–111)
Creatinine, Ser: <0.3 mg/dL — ABNORMAL LOW (ref 0.44–1.00)
Glucose, Bld: 116 mg/dL — ABNORMAL HIGH (ref 70–99)
Potassium: 4.2 mmol/L (ref 3.5–5.1)
Sodium: 132 mmol/L — ABNORMAL LOW (ref 135–145)
Total Bilirubin: 0.5 mg/dL (ref <1.2)
Total Protein: 4.5 g/dL — ABNORMAL LOW (ref 6.5–8.1)

## 2023-01-07 LAB — CBC WITH DIFFERENTIAL/PLATELET
Abs Immature Granulocytes: 0.27 10*3/uL — ABNORMAL HIGH (ref 0.00–0.07)
Basophils Absolute: 0.1 10*3/uL (ref 0.0–0.1)
Basophils Relative: 1 %
Eosinophils Absolute: 1.2 10*3/uL — ABNORMAL HIGH (ref 0.0–0.5)
Eosinophils Relative: 9 %
HCT: 25.7 % — ABNORMAL LOW (ref 36.0–46.0)
Hemoglobin: 8.3 g/dL — ABNORMAL LOW (ref 12.0–15.0)
Immature Granulocytes: 2 %
Lymphocytes Relative: 15 %
Lymphs Abs: 2 10*3/uL (ref 0.7–4.0)
MCH: 32.4 pg (ref 26.0–34.0)
MCHC: 32.3 g/dL (ref 30.0–36.0)
MCV: 100.4 fL — ABNORMAL HIGH (ref 80.0–100.0)
Monocytes Absolute: 1.4 10*3/uL — ABNORMAL HIGH (ref 0.1–1.0)
Monocytes Relative: 10 %
Neutro Abs: 8.3 10*3/uL — ABNORMAL HIGH (ref 1.7–7.7)
Neutrophils Relative %: 63 %
Platelets: 453 10*3/uL — ABNORMAL HIGH (ref 150–400)
RBC: 2.56 MIL/uL — ABNORMAL LOW (ref 3.87–5.11)
RDW: 13.7 % (ref 11.5–15.5)
WBC: 13.3 10*3/uL — ABNORMAL HIGH (ref 4.0–10.5)
nRBC: 0 % (ref 0.0–0.2)

## 2023-01-07 LAB — MAGNESIUM: Magnesium: 1.5 mg/dL — ABNORMAL LOW (ref 1.7–2.4)

## 2023-01-07 MED ORDER — ENSURE ENLIVE PO LIQD
237.0000 mL | Freq: Three times a day (TID) | ORAL | Status: DC
  Administered 2023-01-07 – 2023-01-12 (×16): 237 mL via ORAL

## 2023-01-07 MED ORDER — VITAMIN D (ERGOCALCIFEROL) 1.25 MG (50000 UNIT) PO CAPS
50000.0000 [IU] | ORAL_CAPSULE | ORAL | Status: DC
  Filled 2023-01-07: qty 1

## 2023-01-07 MED ORDER — GABAPENTIN 100 MG PO CAPS
200.0000 mg | ORAL_CAPSULE | Freq: Every day | ORAL | Status: DC
  Administered 2023-01-07 – 2023-01-11 (×5): 200 mg via ORAL
  Filled 2023-01-07 (×5): qty 2

## 2023-01-07 MED ORDER — MAGNESIUM SULFATE 4 GM/100ML IV SOLN
4.0000 g | Freq: Once | INTRAVENOUS | Status: AC
  Administered 2023-01-07: 4 g via INTRAVENOUS
  Filled 2023-01-07: qty 100

## 2023-01-07 MED ORDER — ADULT MULTIVITAMIN W/MINERALS CH
1.0000 | ORAL_TABLET | Freq: Every day | ORAL | Status: DC
  Administered 2023-01-09 – 2023-01-12 (×4): 1 via ORAL
  Filled 2023-01-07 (×5): qty 1

## 2023-01-07 MED ORDER — KCL IN DEXTROSE-NACL 40-5-0.9 MEQ/L-%-% IV SOLN
INTRAVENOUS | Status: AC
  Filled 2023-01-07: qty 1000

## 2023-01-07 MED ORDER — BOOST / RESOURCE BREEZE PO LIQD CUSTOM
1.0000 | Freq: Three times a day (TID) | ORAL | Status: DC
  Administered 2023-01-07: 1 via ORAL

## 2023-01-07 NOTE — Plan of Care (Signed)
  Problem: Coping: Goal: Level of anxiety will decrease Outcome: Progressing   Problem: Elimination: Goal: Will not experience complications related to bowel motility Outcome: Progressing   Problem: Safety: Goal: Ability to remain free from injury will improve Outcome: Progressing   

## 2023-01-07 NOTE — Anesthesia Postprocedure Evaluation (Signed)
Anesthesia Post Note  Patient: Kathryn Underwood  Procedure(s) Performed: ESOPHAGOGASTRODUODENOSCOPY (EGD) WITH PROPOFOL FULL UPPER ENDOSCOPIC ULTRASOUND (EUS) RADIAL DUODENAL STENT PLACEMENT FINE NEEDLE ASPIRATION     Patient location during evaluation: PACU Anesthesia Type: General Level of consciousness: awake and alert Pain management: pain level controlled Vital Signs Assessment: post-procedure vital signs reviewed and stable Respiratory status: spontaneous breathing, nonlabored ventilation, respiratory function stable and patient connected to nasal cannula oxygen Cardiovascular status: blood pressure returned to baseline and stable Postop Assessment: no apparent nausea or vomiting Anesthetic complications: no   No notable events documented.  Last Vitals:  Vitals:   01/07/23 0552 01/07/23 0812  BP: 115/71 101/65  Pulse: 96 (!) 103  Resp: 18   Temp: 36.5 C 36.9 C  SpO2: 100% 99%    Last Pain:  Vitals:   01/07/23 0812  TempSrc: Oral  PainSc:                  [name]

## 2023-01-07 NOTE — Plan of Care (Signed)
  Problem: Education: Goal: Knowledge of General Education information will improve Description: Including pain rating scale, medication(s)/side effects and non-pharmacologic comfort measures Outcome: Progressing   Problem: Activity: Goal: Risk for activity intolerance will decrease Outcome: Progressing   Problem: Nutrition: Goal: Adequate nutrition will be maintained Outcome: Progressing   Problem: Coping: Goal: Level of anxiety will decrease Outcome: Progressing   

## 2023-01-07 NOTE — Progress Notes (Addendum)
PROGRESS NOTE    Kathryn Underwood  ZDG:[id number] DOB: 1960-08-27 DOA: 01/03/2023 PCP: Bary Leriche, PA-C   Brief Narrative:  62 year old female with previous alcohol induced pancreatitis, prior history of substance abuse, anxiety and depression, recent diagnosis of pancreatic adenocarcinoma and pancreatic pseudocyst during admission in November 2024 along with portal vein thrombosis along with cholecystostomy tube placement on 12/13/2022 with subsequent admission from 12/23/2022 to 12/27/2022 for partial gastric outlet obstruction and duodenal stenosis which was managed conservatively presented with worsening nausea, vomiting and weakness on presentation, her sodium was 119.  CT showed gastric outlet obstruction along with right lower lobe pneumonia.  WBCs of 31.  She was started on IV fluids and antibiotics.  GI/oncology/palliative care were consulted.  Assessment & Plan:   Gastric outlet obstruction, most likely from pancreatic malignancy and pseudocysts -GI following: Status post EGD on 01/06/2023 which showed LA grade D erosive esophagitis along with duodenal stenosis in the duodenal bulb and a cystic lesion in the pancreas, fine-needle aspiration for fluid performed.  Follow further GI recommendations.  Diet advancement as per GI.  AKI -From dehydration, poor oral intake, nausea and vomiting -Renal function has improved.  On IV fluids  Possible aspiration pneumonia -Continue Unasyn  Severe sepsis: Present on admission Lactic acidosis: Resolved -Due to aspiration pneumonia.  Presented with tachycardia, tachypnea, leukocytosis, pneumonia, AKI and lactic acidosis -Cultures negative so far.  Antibiotic plan as above.  Leukocytosis -Improving.  Monitor  Thrombocytosis -Possibly reactive.  Monitor intermittently  Anemia of chronic disease -From cancer and chemotherapy  Recent diagnosis of pancreatic adenocarcinoma and pancreatic pseudocyst Portal vein thrombosis Goals of  care -Oncology following.  Overall prognosis is guarded to poor.   -Palliative care following -Eliquis on hold for now.   Hyponatremia -Improving.  Monitor  Hypomagnesemia -Replace.  Repeat a.m. labs  Cholecystostomy previously placed on 12/13/2022 -Outpatient follow-up with IR.  Currently draining and functioning.  History of prior alcohol abuse Bipolar disorder -Continue Remeron and Seroquel.  Continue as needed Ativan  History of COPD -Currently stable.  Cancer related cachexia Severe malnutrition -Follow palliative care consult recommendations    DVT prophylaxis: Eliquis on hold code Status: DNR Family Communication: Significant other and son at bedside Disposition Plan: Status is: Inpatient Remains inpatient appropriate because: Of severity of illness   Consultants: Oncology/GI/palliative care  Procedures: EGD on 01/06/2023 Antimicrobials: Unasyn from 01/03/2023 onwards   Subjective: Patient seen and examined at bedside.  Poor historian.  Patient had worsening agitation overnight requiring IV Ativan/Haldol.  No fever, vomiting, worsening abdominal pain reported.   Objective: Vitals:   01/06/23 1556 01/06/23 1839 01/06/23 2018 01/07/23 0552  BP: 92/64 104/68 (!) 96/59 115/71  Pulse: 97 92 89 96  Resp: (!) 32  16 18  Temp: 98.2 F (36.8 C)  99.3 F (37.4 C) 97.7 F (36.5 C)  TempSrc: Oral  Oral Oral  SpO2: 95% 100% 100% 100%  Weight:      Height:        Intake/Output Summary (Last 24 hours) at 01/07/2023 0748 Last data filed at 01/07/2023 0600 Gross per 24 hour  Intake 1801.83 ml  Output 1250 ml  Net 551.83 ml   Filed Weights   01/03/23 1628  Weight: 45.4 kg    Examination:  General: Currently on room air.  No distress.  Extremely deconditioned looking and chronically ill.  Very thinly built. ENT/neck: No thyromegaly.  JVD is not elevated  respiratory: Decreased breath sounds at bases bilaterally with some crackles; no  wheezing  CVS: S1-S2  heard, rate controlled currently Abdominal: Soft, nontender, slightly distended; no organomegaly, bowel sounds are heard Extremities: Trace lower extremity edema; no cyanosis  CNS: Extremely slow to respond.  Poor historian.  No focal neurologic deficit.  Moves extremities Lymph: No obvious lymphadenopathy Skin: No obvious ecchymosis/lesions  psych: Currently not agitated.  Flat affect mostly. musculoskeletal: No obvious joint swelling/deformity    Data Reviewed: I have personally reviewed following labs and imaging studies  CBC: Recent Labs  Lab 01/03/23 1702 01/04/23 1150 01/05/23 0027 01/06/23 0333 01/07/23 0410  WBC 31.0* 32.3* 32.9* 22.9* 13.3*  NEUTROABS  --   --  27.4* 16.2* 8.3*  HGB 11.9* 9.3* 8.6* 8.1* 8.3*  HCT 34.3* 26.7* 26.0* 24.9* 25.7*  MCV 94.5 95.4 98.9 100.4* 100.4*  PLT 615* 468* 443* 435* 453*   Basic Metabolic Panel: Recent Labs  Lab 01/04/23 0600 01/04/23 1150 01/04/23 1859 01/05/23 0026 01/06/23 0333 01/07/23 0410  NA 124* 128* 128* 128* 131* 132*  K 3.1* 2.9* 2.7* 3.3* 3.5 4.2  CL 79* 83* 84* 86* 95* 100  CO2 36* 36* 34* 35* 31 28  GLUCOSE 110* 111* 113* 105* 127* 116*  BUN 43* 35* 30* 29* 15 7*  CREATININE 0.55 0.48 0.33* 0.35* 0.36* <0.30*  CALCIUM 8.1* 8.2* 8.0* 7.9* 7.9* 7.7*  MG 2.3  --   --   --  1.7 1.5*   GFR: CrCl cannot be calculated (This lab value cannot be used to calculate CrCl because it is not a number: <0.30). Liver Function Tests: Recent Labs  Lab 01/03/23 1850 01/04/23 0600 01/06/23 0333 01/07/23 0410  AST 17 14* 11* 10*  ALT 10 9 10 9   ALKPHOS 139* 112 122 133*  BILITOT 0.4 0.5 0.3 0.5  PROT 5.4* 5.0* 4.7* 4.5*  ALBUMIN 2.6* 2.3* 2.0* 2.0*   Recent Labs  Lab 01/03/23 1850  LIPASE 43   Recent Labs  Lab 01/04/23 0600  AMMONIA 27   Coagulation Profile: No results for input(s): "INR", "PROTIME" in the last 168 hours. Cardiac Enzymes: No results for input(s): "CKTOTAL", "CKMB", "CKMBINDEX", "TROPONINI"  in the last 168 hours. BNP (last 3 results) No results for input(s): "PROBNP" in the last 8760 hours. HbA1C: No results for input(s): "HGBA1C" in the last 72 hours. CBG: No results for input(s): "GLUCAP" in the last 168 hours. Lipid Profile: No results for input(s): "CHOL", "HDL", "LDLCALC", "TRIG", "CHOLHDL", "LDLDIRECT" in the last 72 hours. Thyroid Function Tests: No results for input(s): "TSH", "T4TOTAL", "FREET4", "T3FREE", "THYROIDAB" in the last 72 hours. Anemia Panel: No results for input(s): "VITAMINB12", "FOLATE", "FERRITIN", "TIBC", "IRON", "RETICCTPCT" in the last 72 hours. Sepsis Labs: Recent Labs  Lab 01/03/23 1716 01/03/23 1949  LATICACIDVEN 2.8* 1.1    Recent Results (from the past 240 hours)  Blood culture (routine x 2)     Status: None (Preliminary result)   Collection Time: 01/03/23  6:33 PM   Specimen: BLOOD  Result Value Ref Range Status   Specimen Description   Final    BLOOD PORTA CATH Performed at [facility], [address], [place], [place] [address]    Special Requests   Final    BOTTLES DRAWN AEROBIC AND ANAEROBIC Blood Culture adequate volume Performed at [facility], [address], [place], [place] [address]    Culture   Final    NO GROWTH 3 DAYS Performed at [facility], [address] [place], [place] [address]  Report Status PENDING  Incomplete  Blood culture (routine x 2)     Status: None (Preliminary result)   Collection Time: 01/03/23  6:38 PM   Specimen: BLOOD  Result Value Ref Range Status   Specimen Description   Final    BLOOD LEFT ANTECUBITAL Performed at [facility], [address] [place], [place] [address]    Special Requests   Final    BOTTLES DRAWN AEROBIC AND ANAEROBIC Blood Culture adequate volume Performed at [facility], [address], [place], [place] [address]    Culture   Final    NO GROWTH 3 DAYS Performed at [facility], [address], [place], [place] [address]    Report Status PENDING  Incomplete  Aerobic/Anaerobic Culture w Gram Stain (surgical/deep wound)     Status: None (Preliminary result)   Collection Time: 01/06/23  2:49 PM   Specimen: PATH GI biopsy; Tissue  Result Value Ref Range Status   Specimen Description   Final    CYSTS Performed at [facility], [address], [place], [place] [address]    Special Requests   Final    NONE Performed at [facility], [address] [place], [place] [address]    Gram Stain   Final    NO WBC SEEN NO ORGANISMS SEEN Performed at [facility], [address] [place], [place] [address]    Culture PENDING  Incomplete   Report Status PENDING  Incomplete         Radiology Studies: DG ESOPHAGUS DILATION Result Date: 01/06/2023 ESOPHAGEAL DILATATION: Fluoroscopy was provided for use by the requesting physician.  No images were obtained for radiographic interpretation.  DG C-Arm 1-60 Min-No Report Result Date: 01/06/2023 Fluoroscopy was utilized by the requesting physician.  No radiographic interpretation.        Scheduled Meds:  Chlorhexidine Gluconate Cloth  6 each Topical Daily   mirtazapine  45 mg Oral QHS   pantoprazole (PROTONIX) IV  40 mg Intravenous Q12H   QUEtiapine  200 mg Oral QHS   sucralfate  1 g Oral TID WC & HS   thiamine (VITAMIN B1) injection  100 mg Intravenous Daily   Continuous Infusions:  ampicillin-sulbactam (UNASYN) IV 3 g (01/07/23 0635)   dextrose 5 % and 0.9 % NaCl with KCl 40 mEq/L 75 mL/hr at 01/06/23 1601          Jacklin Zwick, MD [facility] 01/07/2023, 7:48 AM

## 2023-01-07 NOTE — Progress Notes (Signed)
[facility] Progress Note  CC:  new diagnosis of pancreatic cancer with outlet obstruction, malnutrition, weakness, SIRS   Subjective:  Son at bedside.  Patient says that she sipped on some broth last night.  She seems confused on and off.  Says that she is passing flatus.  Says that she is having abdominal pain, 7/10 at times but then calls it "soreness".  Objective:  Vital signs in last 24 hours: Temp:  [97.6 F (36.4 C)-99.3 F (37.4 C)] 98.4 F (36.9 C) (12/12 0812) Pulse Rate:  [89-103] 103 (12/12 0812) Resp:  [16-32] 18 (12/12 0552) BP: (85-115)/(44-71) 101/65 (12/12 0812) SpO2:  [95 %-100 %] 99 % (12/12 0812) Last BM Date : 01/05/23 General:  Alert, thin and frail, in NAD; seems somewhat confused Heart:  Slightly tachy; no murmurs Pulm:  CTAB.  No W/R/R. Abdomen:  Soft, non-distended.  BS present.  Minimally tender. Extremities:  Without edema. Intake/Output from previous day: 12/11 0701 - 12/12 0700 In: 1801.8 [I.V.:1403.7; IV Piggyback:398.2] Out: 1250 [Urine:550; Drains:700]  Lab Results: Recent Labs    01/05/23 0027 01/06/23 0333 01/07/23 0410  WBC 32.9* 22.9* 13.3*  HGB 8.6* 8.1* 8.3*  HCT 26.0* 24.9* 25.7*  PLT 443* 435* 453*   BMET Recent Labs    01/05/23 0026 01/06/23 0333 01/07/23 0410  NA 128* 131* 132*  K 3.3* 3.5 4.2  CL 86* 95* 100  CO2 35* 31 28  GLUCOSE 105* 127* 116*  BUN 29* 15 7*  CREATININE 0.35* 0.36* <0.30*  CALCIUM 7.9* 7.9* 7.7*   LFT Recent Labs    01/07/23 0410  PROT 4.5*  ALBUMIN 2.0*  AST 10*  ALT 9  ALKPHOS 133*  BILITOT 0.5   Assessment / Plan: #47 62 year old white female with new diagnosis of pancreatic cancer made November 2024, readmitted 3 days ago with symptoms consistent with gastric outlet obstruction secondary to progressive duodenal stenosis from pancreatic mass. Had not been able to consistently take p.o.'s at home and had increased frequency of vomiting   Has not yet started  chemotherapy, had been scheduled for this week.   NG had been placed for decompression, had not been draining much and patient pulled NG.   Severe leukocytosis on admission, probable pneumonia on imaging versus pulmonary spread of malignancy Blood cultures are negative She continues on IV Unasyn And WBC has improved today to 13.3K  12/11 procedure: EGD impression: - LA Grade D erosive esophagitis with no bleeding noted distally ( somewhat improved from prior endoscopic evaluation) . - 2 cm hiatal hernia. - Distention deformity of the entire stomach. - Acquired duodenal stenosis in the duodenal bulb apex/ duodenal sweep into the proximal D2 region. Wire placed for visualization fluoroscopically. 22 mm x 9 cm wallflex prosthesis placed. - No gross lesions in the rest of the second portion of the duodenum.  EUS impression: - A cystic lesion was seen in the genu/ body of the pancreas. Cytology results are pending. However, the endosonographic appearance is consistent with a pancreatic pseudocyst with small amount of walled off necrosis. Fine needle aspiration for fluid performed.   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 when able. -Carafate suspension 4 times daily when able. -Fluid studies  from aspirated pancreatic fluid negative for infection so far.  Continue Unasyn for now. -Hold Eliquis until 12/13 p.m., okay to start IV heparin if need to anticoagulate sooner. -Clear liquid diet will advance to full liquids as she tolerates this.   LOS: 4 days   Kathryn Underwood. Kathryn Underwood  01/07/2023, 10:25 AM

## 2023-01-07 NOTE — TOC Initial Note (Signed)
Transition of Care [facility]) - Initial/Assessment Note    Patient Details  Name: Kathryn Underwood MRN: [id number] Date of Birth: 1960-02-07  Transition of Care [facility]) CM/SW Contact:    Larrie Kass, LCSW Phone Number: 01/07/2023, 3:20 PM  Clinical Narrative:                 CSW met with pt and pt's daughter Victorino Dike to discuss recommendations for home health services. CSW explained the limitations to her Medicaid benefit, pt will only receive 3 sessions. Pt verbalized understanding and agreed to services. Pt's daughter inquired about additional help in the home. CSW informed pt about private duty care, which is an OOP cost. Pt's daughter reports that Pt is not able to afford private pay. CSW also informed pt about PCS services that she may qualify for through her Medicaid. Pt and family will need to follow up with pt's Medicaid worker at social services to start the process. CSW has added resources to pt's AVS and also emailed pt's daughter additional information as well. Pt denies any DME needs and reports having a bedside commode, walker, and shower bench.   Iantha Fallen accepted pt for home health PT. Will need home health orders. TOC to follow for d/c needs.   Expected Discharge Plan: Home w Home Health Services Barriers to Discharge: Continued Medical Work up   Patient Goals and CMS Choice Patient states their goals for this hospitalization and ongoing recovery are:: retrun home with home health          Expected Discharge Plan and Services       Living arrangements for the past 2 months: Single Family Home                           HH Arranged: PT HH Agency: [facility] Date [facility] Agency Contacted: 01/07/23 Time HH Agency Contacted: 1518 Representative spoke with at [facility] Agency: Amy  Prior Living Arrangements/Services Living arrangements for the past 2 months: Single Family Home Lives with:: Self Patient language and need for interpreter reviewed:: Yes Do you  feel safe going back to the place where you live?: Yes      Need for Family Participation in Patient Care: Yes (Comment) Care giver support system in place?: No (comment) Current home services: DME Criminal Activity/Legal Involvement Pertinent to Current Situation/Hospitalization: No - Comment as needed  Activities of Daily Living   ADL Screening (condition at time of admission) Independently performs ADLs?: No Does the patient have a NEW difficulty with bathing/dressing/toileting/self-feeding that is expected to last >3 days?: Yes (Initiates electronic notice to provider for possible OT consult) Does the patient have a NEW difficulty with getting in/out of bed, walking, or climbing stairs that is expected to last >3 days?: Yes (Initiates electronic notice to provider for possible PT consult) Does the patient have a NEW difficulty with communication that is expected to last >3 days?: No Is the patient deaf or have difficulty hearing?: No Does the patient have difficulty seeing, even when wearing glasses/contacts?: No Does the patient have difficulty concentrating, remembering, or making decisions?: No  Permission Sought/Granted                  Emotional Assessment Appearance:: Appears stated age Attitude/Demeanor/Rapport: Gracious, Charismatic, Engaged Affect (typically observed): Accepting Orientation: : Oriented to Self, Oriented to Place, Oriented to  Time, Oriented to Situation   Psych Involvement: No (comment)  Admission diagnosis:  Hyponatremia [E87.1] Weakness [  R53.1] Patient Active Problem List   Diagnosis Date Noted   Overlapping malignant neoplasm of pancreas (HCC) 01/04/2023   Failure to thrive (child) 01/04/2023   Gastric outlet obstruction 01/04/2023   Hyponatremia 01/03/2023   Dehydration 12/28/2022   SIRS (systemic inflammatory response syndrome) (HCC) 12/24/2022   Abdominal pain 12/23/2022   Pancreatic adenocarcinoma (HCC) 12/16/2022   Biliary obstruction  12/15/2022   Partial gastric outlet obstruction 12/15/2022   High serum carbohydrate antigen 19-9 (CA19-9) 12/12/2022   Pancreatic cyst 12/12/2022   Abnormal CT scan, gastrointestinal tract 12/11/2022   Acute esophagitis 12/10/2022   Acute on chronic pancreatitis (HCC) 12/09/2022   Protein malnutrition (HCC) 12/09/2022   Unintentional weight loss 12/09/2022   Esophageal dysphagia 12/09/2022   Dilation of pancreatic duct 12/09/2022   Protein-calorie malnutrition, severe 12/09/2022   Pancreatic mass 12/08/2022   Multifocal atrial tachycardia (HCC) 08/31/2021   Delirium tremens (HCC) 07/27/2021   Pancreas hemorrhage 07/27/2021   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   Uterine fibroid 07/27/2021   COPD (chronic obstructive pulmonary disease) (HCC) 07/27/2021   Insomnia 07/27/2021   Pulmonary nodule 07/25/2021   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/25/2021   Alcohol induced acute pancreatitis without necrosis or infection 10/05/2017   Alcoholic ketoacidosis 10/04/2017   Epigastric pain 10/04/2017   MDD (major depressive disorder), recurrent severe, 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   Alcohol withdrawal (HCC) 04/25/2016   Sinus tachycardia 04/25/2016   Transaminitis 04/25/2016   Leukocytosis 04/25/2016   Closed nondisplaced fracture of fifth right metatarsal bone 04/08/2016   Acute pain of right shoulder 04/08/2016   Vitamin D deficiency 12/11/2015   Alcohol dependence with unspecified alcohol-induced disorder (HCC) 12/09/2015   Fatigue 12/09/2015   Encounter for alcohol abuse counseling and surveillance 12/09/2015   Cough 01/04/2015   Tobacco abuse 01/04/2015   Depression with anxiety 01/04/2015   PCP:  Bary Leriche, PA-C Pharmacy:   [facility] [phone] [facility] [place] [place] - [address] [place] [address] Phone: [phone] Fax: [phone]  [facility] [address] [place] [address] Phone: [phone] Fax: [phone]  [facility] [address] [place] [address] Phone: [phone] Fax: [phone]  [facility] [address] [place] [address] Phone: [phone] Fax: [phone]  [facility] [address] [place] [address] Phone: [phone] Fax: [phone]     Social Drivers of Health (SDOH) Social History: SDOH Screenings   Food Insecurity: No Food Insecurity (01/03/2023)  Housing: Low Risk  (01/03/2023)  Transportation Needs: No Transportation Needs (01/03/2023)  Utilities: Not At Risk (01/03/2023)  Tobacco Use: High Risk (01/06/2023)   SDOH Interventions:     Readmission Risk Interventions    12/25/2022   10:56 AM  Readmission Risk Prevention Plan  Transportation Screening Complete  PCP or Specialist Appt within 3-5 Days Complete  HRI or Home Care Consult Complete  Social Work Consult for Recovery Care Planning/Counseling Complete  Palliative Care Screening Not Applicable  Medication Review Oceanographer) Referral to Pharmacy

## 2023-01-07 NOTE — Progress Notes (Signed)
   01/07/23 1223  Assess: MEWS Score  Temp 98.4 F (36.9 C)  BP (!) 94/57  MAP (mmHg) 70  Pulse Rate (!) 103  SpO2 98 %  O2 Device Room Air  Assess: MEWS Score  MEWS Temp 0  MEWS Systolic 1  MEWS Pulse 1  MEWS RR 0  MEWS LOC 0  MEWS Score 2  MEWS Score Color Yellow  Assess: if the MEWS score is Yellow or Red  Were vital signs accurate and taken at a resting state? Yes  Does the patient meet 2 or more of the SIRS criteria? Yes  Does the patient have a confirmed or suspected source of infection? Yes  MEWS guidelines implemented  Yes, yellow  Treat  MEWS Interventions Considered administering scheduled or prn medications/treatments as ordered  Take Vital Signs  Increase Vital Sign Frequency  Yellow: Q2hr x1, continue Q4hrs until patient remains green for 12hrs  Escalate  MEWS: Escalate Yellow: Discuss with charge nurse and consider notifying provider and/or RRT  Notify: Charge Nurse/RN  Name of Charge Nurse/RN Notified Leisure centre manager  Assess: SIRS CRITERIA  SIRS Temperature  0  SIRS Respirations  0  SIRS Pulse 1  SIRS WBC 1  SIRS Score Sum  2

## 2023-01-07 NOTE — Progress Notes (Signed)
Daily Progress Note   Patient Name: Kathryn Underwood       Date: 01/07/2023 DOB: Jul 31, 1960  Age: 62 y.o. MRN#: [id number] Attending Physician: Glade Lloyd, MD Primary Care Physician: Allwardt, Crist Infante, PA-C Admit Date: 01/03/2023  Reason for Consultation/Follow-up: Establishing goals of care  Subjective: Awake alert at present, son present at bedside.  Was confused and required Haldol overnight.  Patient to attempt Jell-O and broth today.  States she takes gabapentin at home for anxiety.  Length of Stay: 4  Current Medications: Scheduled Meds:   Chlorhexidine Gluconate Cloth  6 each Topical Daily   feeding supplement  1 Container Oral TID BM   mirtazapine  45 mg Oral QHS   pantoprazole (PROTONIX) IV  40 mg Intravenous Q12H   QUEtiapine  200 mg Oral QHS   sucralfate  1 g Oral TID WC & HS   thiamine (VITAMIN B1) injection  100 mg Intravenous Daily    Continuous Infusions:  ampicillin-sulbactam (UNASYN) IV 3 g (01/07/23 1323)   dextrose 5 % and 0.9 % NaCl with KCl 40 mEq/L 75 mL/hr at 01/06/23 1601    PRN Meds: haloperidol lactate, LORazepam, melatonin, morphine injection, ondansetron **OR** ondansetron (ZOFRAN) IV, sodium chloride flush  Physical Exam         Awake alert sitting up in bed Answers a few questions appropriately Appears thin and frail Regular work of breathing Does not have edema Mild generalized abdominal tenderness  Vital Signs: BP (!) 94/57 (BP Location: Left Arm)   Pulse (!) 103   Temp 98.4 F (36.9 C) (Oral)   Resp 18   Ht 5\' 3"  (1.6 m)   Wt 45.4 kg   SpO2 98%   BMI 17.71 kg/m  SpO2: SpO2: 98 % O2 Device: O2 Device: Room Air O2 Flow Rate: O2 Flow Rate (L/min): 2 L/min  Intake/output summary:  Intake/Output Summary (Last 24 hours) at  01/07/2023 1336 Last data filed at 01/07/2023 0600 Gross per 24 hour  Intake 1801.83 ml  Output 1250 ml  Net 551.83 ml   LBM: Last BM Date : 01/05/23 Baseline Weight: Weight: 45.4 kg Most recent weight: Weight: 45.4 kg       Palliative Assessment/Data:      Patient Active Problem List   Diagnosis Date Noted   Overlapping malignant neoplasm of  pancreas (HCC) 01/04/2023   Failure to thrive (child) 01/04/2023   Gastric outlet obstruction 01/04/2023   Hyponatremia 01/03/2023   Dehydration 12/28/2022   SIRS (systemic inflammatory response syndrome) (HCC) 12/24/2022   Abdominal pain 12/23/2022   Pancreatic adenocarcinoma (HCC) 12/16/2022   Biliary obstruction 12/15/2022   Partial gastric outlet obstruction 12/15/2022   High serum carbohydrate antigen 19-9 (CA19-9) 12/12/2022   Pancreatic cyst 12/12/2022   Abnormal CT scan, gastrointestinal tract 12/11/2022   Acute esophagitis 12/10/2022   Acute on chronic pancreatitis (HCC) 12/09/2022   Protein malnutrition (HCC) 12/09/2022   Unintentional weight loss 12/09/2022   Esophageal dysphagia 12/09/2022   Dilation of pancreatic duct 12/09/2022   Protein-calorie malnutrition, severe 12/09/2022   Pancreatic mass 12/08/2022   Multifocal atrial tachycardia (HCC) 08/31/2021   Delirium tremens (HCC) 07/27/2021   Pancreas hemorrhage 07/27/2021   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   Uterine fibroid 07/27/2021   COPD (chronic obstructive pulmonary disease) (HCC) 07/27/2021   Insomnia 07/27/2021   Pulmonary nodule 07/25/2021   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/25/2021   Alcohol induced acute pancreatitis without necrosis or infection 10/05/2017   Alcoholic ketoacidosis 10/04/2017   Epigastric pain 10/04/2017   MDD (major depressive disorder), recurrent severe, 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   Alcohol withdrawal (HCC) 04/25/2016   Sinus tachycardia 04/25/2016   Transaminitis 04/25/2016   Leukocytosis 04/25/2016   Closed  nondisplaced fracture of fifth right metatarsal bone 04/08/2016   Acute pain of right shoulder 04/08/2016   Vitamin D deficiency 12/11/2015   Alcohol dependence with unspecified alcohol-induced disorder (HCC) 12/09/2015   Fatigue 12/09/2015   Encounter for alcohol abuse counseling and surveillance 12/09/2015   Cough 01/04/2015   Tobacco abuse 01/04/2015   Depression with anxiety 01/04/2015    Palliative Care Assessment & Plan   Patient Profile: 62 year old lady with new diagnosis of pancreatic cancer with outlet obstruction malnutrition weakness.  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 Possibly also has hepatic metastases, has electrolyte abnormalities, has anxiety, malnutrition.  Recommendations/Plan: Palliative care consulted for goals of care discussions.  At present, patient and family wish to continue with current scope of care.  DNR DNI has been established.  Palliative team to follow peripherally and to monitor hospital course, monitor oral intake, monitor patient's overall condition.  Code Status:    Code Status Orders  (From admission, onward)           Start     Ordered   01/06/23 1039  Do not attempt resuscitation (DNR)- Limited -Do Not Intubate (DNI)  (Code Status)  Continuous       Question Answer Comment  If pulseless and not breathing No CPR or chest compressions.   In Pre-Arrest Conditions (Patient Is Breathing and Has A Pulse) Do not intubate. Provide all appropriate non-invasive medical interventions. Avoid ICU transfer unless indicated or  required.   Consent: Discussion documented in EHR or advanced directives reviewed      01/06/23 1038           Code Status History     Date Active Date Inactive Code Status Order  ID Comments User Context   01/05/2023 1158 01/06/2023 1038 Do not attempt resuscitation (DNR) - Comfort care [id number]  Rhetta Mura, MD Inpatient   01/03/2023 2138 01/05/2023 1158 Full Code [id number]  Hillary Bow, DO ED   12/24/2022 0040 12/27/2022 2103 Full Code [id number]  Briscoe Deutscher, MD Inpatient   12/08/2022 1826 12/18/2022 2325 Full Code [id number]  Steffanie Rainwater, MD Inpatient   07/24/2021 1554 08/06/2021 2317 Full Code [id number]  Lurline Del, MD Inpatient   03/10/2018 0513 03/10/2018 1602 Full Code [id number]  Paula Libra, MD ED   11/08/2016 0923 11/12/2016 2026 Full Code [id number]  Zigmund Daniel., MD Inpatient   11/08/2016 0510 11/08/2016 0923 Full Code [id number]  Shon Baton, MD ED   04/25/2016 2039 04/28/2016 2011 Full Code [id number]  Jackie Plum, MD Inpatient       Prognosis:  Guarded   Discharge Planning: To Be Determined  Care plan was discussed with patient, son and RN in the room.   Thank you for allowing the Palliative Medicine Team to assist in the care of this patient.  Mod MDM.      Greater than 50%  of this time was spent counseling and coordinating care related to the above assessment and plan.  Rosalin Hawking, MD  Please contact Palliative Medicine Team phone at [phone] for questions and concerns.

## 2023-01-07 NOTE — Progress Notes (Signed)
Nutrition Follow-up  DOCUMENTATION CODES:   Severe malnutrition in context of chronic illness  INTERVENTION:  - Full Liquid diet per GI. - Ensure Plus High Protein po BID, each supplement provides 350 kcal and 20 grams of protein. - Encourage intake at all meals and of supplements.  - Add Multivitamin with minerals daily - Add vitamin D supplementation, 50,000 units weekly x2 months. - Monitor weight trends.   NUTRITION DIAGNOSIS:   Severe Malnutrition related to chronic illness (pancreatic adenocarcinoma) as evidenced by severe fat depletion, severe muscle depletion, energy intake < 75% for > or equal to 1 month. *ongoing  GOAL:   Patient will meet greater than or equal to 90% of their needs *progressing   MONITOR:   Diet advancement, Labs, Weight trends  REASON FOR ASSESSMENT:   Consult New TPN/TNA ("Gastric outlet obstruction due to pancreatic CA, cachexia.  I suspect she's gonna need TPN.")  ASSESSMENT:   62 y.o. female with history of previous EtOH induced pancreatitis, prior history of substance abuse, anxiety and depression who was very recently diagnosed with pancreatic adenocarcinoma during admission in November 2024 who presented with N/V and weakness. Admitted for AKI, severe hyponatremia, and underlying gastric outlet obstruction with pancreatic malignancy and pseudocysts.  12/8 Admit 12/11 pulled out NGT; s/p Upper EUS; advanced to CLD 12/12 FLD  Patient a little confused during visit. RN at bedside reports she had some ginger ale last night but unsure how much. Was too lethargic early this AM for PO but later on had a couple sips of broth and tolerated well. She is agreeable to receive Boost Breeze.   Patient asking about eating fruit or smoothies. Discussed food items allowed on the clear liquid diet.  Diet advanced to full liquids after visit. GI following after EUS and advancing diet as tolerated.  Will order Ensure to support intake and add  multivitamin.  Vitamin D noted to be low, supplementation discussed with MD who is on board with repletion plan.  Need to monitor PO intake closely due to restrictive diet and severe malnutrition. Palliative care following patient, she is currently DNR/DNI.     Medications reviewed and include: Remeron, Carafate, 100mg  thiamine  Labs reviewed:  Na 132 Magnesium 1.5 Vitamin D: 10.16 (LOW)   Diet Order:   Diet Order             Diet full liquid Room service appropriate? Yes; Fluid consistency: Thin  Diet effective now                   EDUCATION NEEDS:  Education needs have been addressed  Skin:  Skin Assessment: Reviewed RN Assessment  Last BM:  12/10  Height:  Ht Readings from Last 1 Encounters:  01/03/23 5\' 3"  (1.6 m)   Weight:  Wt Readings from Last 1 Encounters:  01/03/23 45.4 kg    BMI:  Body mass index is 17.71 kg/m.  Estimated Nutritional Needs:  Kcal:  1800-2000 kcals Protein:  80-90 grams Fluid:  >/= 1.8L    Shelle Iron RD, LDN Contact via Secure Chat.

## 2023-01-07 NOTE — Progress Notes (Signed)
Physical Therapy Treatment Patient Details Name: Kathryn Underwood MRN: [id number] DOB: 06-05-1960 Today's Date: 01/07/2023   History of Present Illness Pt is a 62 y.o. female presenting with hypovolemic hyponatremia due to decreased PO intake, increased N/V from gastric outlet obstruction. Pt recently admitted 11/27-12/1 with SIRS, duodenal ulcer, and partial gastric outlet obstruction. Past medical history significant of alcoholism, pancreatitis with pseudocyst, anxiety, depression, neuropathy, and recent diagnosis of pancreatic adenocarcinoma.    PT Comments  Pt tolerated increased activity level today. She ambulated 77' with RW with min A with RW to navigate obstacles. SpO2 93% on room air with activity. Pt requires verbal cues for safe technique with mobility.     If plan is discharge home, recommend the following: A little help with walking and/or transfers;A little help with bathing/dressing/bathroom;Help with stairs or ramp for entrance;Assist for transportation;Assistance with cooking/housework   Can travel by private vehicle        Equipment Recommendations  None recommended by PT    Recommendations for Other Services       Precautions / Restrictions Precautions Precautions: Fall Restrictions Weight Bearing Restrictions Per Provider Order: No     Mobility  Bed Mobility Overal bed mobility: Needs Assistance Bed Mobility: Supine to Sit     Supine to sit: Min assist, HOB elevated     General bed mobility comments: assist to raise trunk    Transfers Overall transfer level: Needs assistance Equipment used: Rolling walker (2 wheels) Transfers: Sit to/from Stand Sit to Stand: Min assist, From elevated surface           General transfer comment: VCs for hand placement    Ambulation/Gait Ambulation/Gait assistance: Min assist Gait Distance (Feet): 75 Feet Assistive device: Rolling walker (2 wheels) Gait Pattern/deviations: Step-through pattern, Decreased  stride length Gait velocity: decr     General Gait Details: VCs to increase step length and for positioning in RW; min A to steer RW around obstacles; SpO2 93% on room air   Stairs             Wheelchair Mobility     Tilt Bed    Modified Rankin (Stroke Patients Only)       Balance   Sitting-balance support: Feet supported, No upper extremity supported Sitting balance-Leahy Scale: Good       Standing balance-Leahy Scale: Fair                              Cognition Arousal: Lethargic Behavior During Therapy: WFL for tasks assessed/performed Overall Cognitive Status: Within Functional Limits for tasks assessed                                          Exercises      General Comments        Pertinent Vitals/Pain Pain Assessment Faces Pain Scale: Hurts even more Pain Location: back Pain Descriptors / Indicators: Discomfort, Sore Pain Intervention(s): Limited activity within patient's tolerance, Monitored during session, Repositioned    Home Living                          Prior Function            PT Goals (current goals can now be found in the care plan section) Acute Rehab PT Goals Patient Stated Goal: Go  home; likes to bake cookies and pies PT Goal Formulation: With patient/family Time For Goal Achievement: 01/19/23 Potential to Achieve Goals: Fair Progress towards PT goals: Progressing toward goals    Frequency    Min 1X/week      PT Plan      Co-evaluation              AM-PAC PT "6 Clicks" Mobility   Outcome Measure  Help needed turning from your back to your side while in a flat bed without using bedrails?: A Little Help needed moving from lying on your back to sitting on the side of a flat bed without using bedrails?: A Little Help needed moving to and from a bed to a chair (including a wheelchair)?: A Little Help needed standing up from a chair using your arms (e.g., wheelchair or  bedside chair)?: A Little Help needed to walk in hospital room?: A Little Help needed climbing 3-5 steps with a railing? : A Lot 6 Click Score: 17    End of Session Equipment Utilized During Treatment: Gait belt Activity Tolerance: Patient limited by fatigue;Patient tolerated treatment well Patient left: in chair;with call bell/phone within reach;with chair alarm set;with family/visitor present Nurse Communication: Mobility status PT Visit Diagnosis: Muscle weakness (generalized) (M62.81);Difficulty in walking, not elsewhere classified (R26.2)     Time: 1610-9604 PT Time Calculation (min) (ACUTE ONLY): 18 min  Charges:    $Gait Training: 8-22 mins PT General Charges $$ ACUTE PT VISIT: 1 Visit                     Tamala Ser PT 01/07/2023  Acute Rehabilitation Services  Office [phone]

## 2023-01-08 LAB — CULTURE, BLOOD (ROUTINE X 2)
Culture: NO GROWTH
Culture: NO GROWTH
Special Requests: ADEQUATE
Special Requests: ADEQUATE

## 2023-01-08 LAB — COMPREHENSIVE METABOLIC PANEL
ALT: 13 U/L (ref 0–44)
AST: 19 U/L (ref 15–41)
Albumin: 2 g/dL — ABNORMAL LOW (ref 3.5–5.0)
Alkaline Phosphatase: 379 U/L — ABNORMAL HIGH (ref 38–126)
Anion gap: 4 — ABNORMAL LOW (ref 5–15)
BUN: <5 mg/dL — ABNORMAL LOW (ref 8–23)
CO2: 25 mmol/L (ref 22–32)
Calcium: 7.5 mg/dL — ABNORMAL LOW (ref 8.9–10.3)
Chloride: 104 mmol/L (ref 98–111)
Creatinine, Ser: 0.36 mg/dL — ABNORMAL LOW (ref 0.44–1.00)
GFR, Estimated: >60 mL/min (ref >60)
Glucose, Bld: 134 mg/dL — ABNORMAL HIGH (ref 70–99)
Potassium: 4.6 mmol/L (ref 3.5–5.1)
Sodium: 133 mmol/L — ABNORMAL LOW (ref 135–145)
Total Bilirubin: 0.4 mg/dL (ref <1.2)
Total Protein: 4.6 g/dL — ABNORMAL LOW (ref 6.5–8.1)

## 2023-01-08 LAB — CBC WITH DIFFERENTIAL/PLATELET
Abs Immature Granulocytes: 0.5 10*3/uL — ABNORMAL HIGH (ref 0.00–0.07)
Basophils Absolute: 0.2 10*3/uL — ABNORMAL HIGH (ref 0.0–0.1)
Basophils Relative: 1 %
Eosinophils Absolute: 1.2 10*3/uL — ABNORMAL HIGH (ref 0.0–0.5)
Eosinophils Relative: 9 %
HCT: 26.6 % — ABNORMAL LOW (ref 36.0–46.0)
Hemoglobin: 8.8 g/dL — ABNORMAL LOW (ref 12.0–15.0)
Immature Granulocytes: 4 %
Lymphocytes Relative: 23 %
Lymphs Abs: 3 10*3/uL (ref 0.7–4.0)
MCH: 32.6 pg (ref 26.0–34.0)
MCHC: 33.1 g/dL (ref 30.0–36.0)
MCV: 98.5 fL (ref 80.0–100.0)
Monocytes Absolute: 1.6 10*3/uL — ABNORMAL HIGH (ref 0.1–1.0)
Monocytes Relative: 12 %
Neutro Abs: 6.4 10*3/uL (ref 1.7–7.7)
Neutrophils Relative %: 51 %
Platelets: 538 10*3/uL — ABNORMAL HIGH (ref 150–400)
RBC: 2.7 MIL/uL — ABNORMAL LOW (ref 3.87–5.11)
RDW: 14.3 % (ref 11.5–15.5)
WBC: 12.8 10*3/uL — ABNORMAL HIGH (ref 4.0–10.5)
nRBC: 0 % (ref 0.0–0.2)

## 2023-01-08 LAB — MAGNESIUM: Magnesium: 2.1 mg/dL (ref 1.7–2.4)

## 2023-01-08 MED ORDER — SODIUM CHLORIDE 0.9 % IV BOLUS
500.0000 mL | Freq: Once | INTRAVENOUS | Status: AC
  Administered 2023-01-08: 500 mL via INTRAVENOUS

## 2023-01-08 MED ORDER — KCL IN DEXTROSE-NACL 40-5-0.9 MEQ/L-%-% IV SOLN
INTRAVENOUS | Status: DC
Start: 2023-01-08 — End: 2023-01-08
  Filled 2023-01-08: qty 1000

## 2023-01-08 MED ORDER — DEXTROSE-SODIUM CHLORIDE 5-0.9 % IV SOLN: INTRAVENOUS | Status: DC

## 2023-01-08 MED ORDER — APIXABAN 5 MG PO TABS
5.0000 mg | ORAL_TABLET | Freq: Two times a day (BID) | ORAL | Status: DC
  Administered 2023-01-08 – 2023-01-12 (×8): 5 mg via ORAL
  Filled 2023-01-08 (×8): qty 1

## 2023-01-08 NOTE — Progress Notes (Addendum)
    Patient Name: Kathryn Underwood           DOB: Aug 15, 1960  MRN: [id number]      Admission Date: 01/03/2023  Attending Provider: Glade Lloyd, MD  Primary Diagnosis: Hyponatremia   Level of care: Med-Surg    CROSS COVER NOTE   Date of Service   01/08/2023   Kathryn Underwood, 62 y.o. female, was admitted on 01/03/2023 for Hyponatremia.    HPI/Events of Note   Asymptomatic tachycardia, HR 130-140s while resting. All other vital stable. Current SBP 90-100s.  No acute changes or distress reported.    Interventions/ Plan   Fluid bolus, 500 cc EKG - post bolus-sinus rhythm, HR 90s        Anthoney Harada, DNP, [employer]

## 2023-01-08 NOTE — Plan of Care (Signed)
  Problem: Health Behavior/Discharge Planning: Goal: Ability to manage health-related needs will improve Outcome: Progressing   Problem: Activity: Goal: Risk for activity intolerance will decrease Outcome: Progressing   Problem: Nutrition: Goal: Adequate nutrition will be maintained Outcome: Progressing   Problem: Coping: Goal: Level of anxiety will decrease Outcome: Progressing   Problem: Elimination: Goal: Will not experience complications related to urinary retention Outcome: Progressing   

## 2023-01-08 NOTE — Progress Notes (Addendum)
Kathryn Underwood   DOB:1960/10/20   UJ#:[id number]      ASSESSMENT & PLAN:  1.  Pancreatic cancer [id number]  -Newly diagnosed in November 2024 -Patient was scheduled for first cycle of chemotherapy with gemcitabine and Abraxane on 01/06/2023 but had to be postponed. -No chemotherapy at this time.   -Recommend palliative evaluation.   -If patient gets better may reconsider therapy options. -Follows with medical oncology/Dr. Mosetta Putt  2.  Gastric outlet obstruction -Likely secondary to pancreatic mass -Status post EGD on 01/06/2023 -Patient reports abdomen feels better today  3.  Severe protein and calorie malnutrition -Secondary to malignancy and low oral intake -Encourage liquid nutrition with boost max, multivitamin supplementation.  4.  Severe sepsis -Present on admission -Continue antibiotics   Code Status DNR-Limited  Goals of care: Palliative care recommended   Subjective:  Patient seen awake alert and oriented laying in bed.  Family member at bedside.  Patient readmitted on 01/06/2023 due to to worsening nausea, vomiting, and weakness.  Patient appears weak and cachectic.   Discussed with patient and family member no chemotherapy at this time; we recommend palliative care.  Objective:  Vitals:   01/08/23 0447 01/08/23 0613  BP: 102/66 (!) 84/51  Pulse: (!) 108 (!) 102  Resp: 16 18  Temp: 97.7 F (36.5 C) 98.3 F (36.8 C)  SpO2: 96% 94%     Intake/Output Summary (Last 24 hours) at 01/08/2023 0945 Last data filed at 01/08/2023 9562 Gross per 24 hour  Intake 2418.27 ml  Output 475 ml  Net 1943.27 ml     REVIEW OF SYSTEMS:   Constitutional: +fatigue, Denies fevers, chills or abnormal night sweats Eyes: Denies blurriness of vision, double vision or watery eyes Ears, nose, mouth, throat, and face: Denies mucositis or sore throat Respiratory: Denies cough, dyspnea or wheezes Cardiovascular: Denies palpitation, chest discomfort or lower extremity  swelling Gastrointestinal:  +nausea, heartburn or change in bowel habits Skin: Denies abnormal skin rashes Lymphatics: Denies new lymphadenopathy or easy bruising Neurological: Denies numbness, tingling or new weaknesses Behavioral/Psych: Mood is stable, no new changes  All other systems were reviewed with the patient and are negative.  PHYSICAL EXAMINATION: ECOG PERFORMANCE STATUS: 3 - Symptomatic, >50% confined to bed  Vitals:   01/08/23 0447 01/08/23 0613  BP: 102/66 (!) 84/51  Pulse: (!) 108 (!) 102  Resp: 16 18  Temp: 97.7 F (36.5 C) 98.3 F (36.8 C)  SpO2: 96% 94%   Filed Weights   01/03/23 1628  Weight: 100 lb (45.4 kg)    GENERAL: alert, +weak, +cachectic  SKIN: skin color, texture, turgor are normal, no rashes or significant lesions EYES: normal, conjunctiva are pink and non-injected, sclera clear OROPHARYNX: no exudate, no erythema and lips, buccal mucosa, and tongue normal  NECK: supple, thyroid normal size, non-tender, without nodularity LYMPH: no palpable lymphadenopathy in the cervical, axillary or inguinal LUNGS: clear to auscultation and percussion with normal breathing effort HEART: regular rate & rhythm and no murmurs and no lower extremity edema ABDOMEN: abdomen soft, non-tender and normal bowel sounds MUSCULOSKELETAL: no cyanosis of digits and no clubbing  PSYCH: alert & oriented x 3 with fluent speech NEURO: no focal motor/sensory deficits   All questions were answered. The patient knows to call the clinic with any problems, questions or concerns.   The total time spent in the appointment was 40 minutes encounter with patient including review of chart and various tests results, discussions about plan of care and coordination of care plan  Dietrich Pates Rouson, NP 01/08/2023 9:45 AM    Labs Reviewed:  Lab Results  Component Value Date   WBC 12.8 (H) 01/08/2023   HGB 8.8 (L) 01/08/2023   HCT 26.6 (L) 01/08/2023   MCV 98.5 01/08/2023   PLT 538 (H)  01/08/2023   Recent Labs    12/08/22 1231 12/08/22 1236 12/13/22 0611 12/14/22 0601 12/16/22 0554 12/17/22 0655 12/17/22 0657 01/06/23 0333 01/07/23 0410 01/08/23 0430  NA  --    < > 133*   < > 133*  --    < > 131* 132* 133*  K  --    < > 5.0   < > 2.9*  --    < > 3.5 4.2 4.6  CL  --    < > 105   < > 99  --    < > 95* 100 104  CO2  --    < > 23   < > 26  --    < > 31 28 25   GLUCOSE  --    < > 144*   < > 114*  --    < > 127* 116* 134*  BUN  --    < > 17   < > 5*  --    < > 15 7* <5*  CREATININE  --    < > 0.76   < > 0.46  --    < > 0.36* <0.30* 0.36*  CALCIUM  --    < > 7.7*   < > 8.4*  --    < > 7.9* 7.7* 7.5*  GFRNONAA  --    < > >60   < > >60  --    < > >60 NOT CALCULATED >60  PROT 7.0   < > 5.2*   < > 5.8* 5.0*   < > 4.7* 4.5* 4.6*  ALBUMIN 3.5   < > 2.4*   < > 2.6*  2.6* 2.3*   < > 2.0* 2.0* 2.0*  AST 26   < > 67*   < > 20 12*   < > 11* 10* 19  ALT 9   < > 26   < > 22 14   < > 10 9 13   ALKPHOS 88   < > 188*   < > 224* 173*   < > 122 133* 379*  BILITOT 0.6   < > 1.5*   < > 1.0 0.9   < > 0.3 0.5 0.4  BILIDIR 0.1  --  0.7*  --  0.3* 0.2  --   --   --   --   IBILI 0.5  --   --   --  0.7 0.7  --   --   --   --    < > = values in this interval not displayed.    Studies Reviewed:  DG ESOPHAGUS DILATION Result Date: 01/06/2023 ESOPHAGEAL DILATATION: Fluoroscopy was provided for use by the requesting physician.  No images were obtained for radiographic interpretation.  DG C-Arm 1-60 Min-No Report Result Date: 01/06/2023 Fluoroscopy was utilized by the requesting physician.  No radiographic interpretation.   DG Abd Portable 1V Result Date: 01/04/2023 CLINICAL DATA:  Nasogastric tube placement. EXAM: PORTABLE ABDOMEN - 1 VIEW COMPARISON:  Portable chest dated 01/03/2023 and abdomen and pelvis CT dated 01/03/2023. FINDINGS: Nasogastric tube side hole just distal to the gastroesophageal junction and tip in the proximal to mid stomach. With left jugular catheter tip  at the superior  cavoatrial junction. Resolved left basilar atelectasis. The included bowel-gas pattern is unremarkable. IMPRESSION: Nasogastric tube tip in the proximal to mid stomach. Electronically Signed   By: Beckie Salts M.D.   On: 01/04/2023 17:29   CT ABDOMEN PELVIS WO CONTRAST Result Date: 01/03/2023 CLINICAL DATA:  Abdominal pain. Nausea vomiting for several days. Reported history of pancreatic carcinoma. EXAM: CT ABDOMEN AND PELVIS WITHOUT CONTRAST TECHNIQUE: Multidetector CT imaging of the abdomen and pelvis was performed following the standard protocol without IV contrast. RADIATION DOSE REDUCTION: This exam was performed according to the departmental dose-optimization program which includes automated exposure control, adjustment of the mA and/or kV according to patient size and/or use of iterative reconstruction technique. COMPARISON:  12/23/2022. FINDINGS: Lower chest: Ill-defined peribronchovascular ground-glass and more confluent opacities, most evident in the left lower lobe, new since the prior CT. No pleural effusion. Small pericardial effusion similar to the prior CT. Fluid mildly distends the distal esophagus. Hepatobiliary: Ill-defined hypoattenuation involving segments 4 and 5 as well as portions of segment 6, better defined on the prior contrast enhanced CT, consistent with metastatic disease. There also several more well-defined hypoattenuating liver masses there are stable. No new liver abnormalities. Pigtail drainage catheter lies along the inferior margin of the right lobe, unchanged. Pancreas: Heterogeneous mass with ill-defined margins arises from the pancreatic head with coarse central calcifications. This is unchanged from the prior CT. Fluid collection abuts the right inferior pancreatic head and adjacent duodenum measuring 3.7 cm. Larger fluid collection abuts the proximal pancreatic tail, posterior to the stomach, 5.8 cm in size. These are similar to the prior CT suspected to be pseudocysts.  There is some fat stranding adjacent to the pancreas, similar to the prior CT. Spleen: Normal. Adrenals/Urinary Tract: Mild bilateral adrenal gland thickening consistent with hyperplasia. Normal kidneys, ureters and bladder. Stomach/Bowel: Significant gastric distension. No stomach wall thickening. Duodenum broadly abuts the pancreatic head mass. Wall not defined separate from the mass on this unenhanced exam. Small bowel and colon are normal in caliber. No wall thickening or inflammation. Mild to moderate increase in the colonic stool burden similar to the prior CT. Vascular/Lymphatic: Aortic atherosclerotic calcifications. No defined enlarged lymph nodes. Reproductive: Partly calcified uterine fibroids.  No adnexal masses. Other: No ascites. Musculoskeletal: No fracture or acute finding.  No bone lesion. IMPRESSION: 1. Significant stomach distention. This is suspected to be due to partial gastric outlet obstruction at the level of the duodenum due to the large pancreatic mass. 2. Pancreatic mass, liver lesions and right upper quadrant drainage catheter are without significant change from the recent prior CT, better defined on the prior study due to the presence of intravenous contrast. 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 These are also unchanged from the recent prior CT. Electronically Signed   By: Amie Portland M.D.   On: 01/03/2023 19:09   DG Chest Portable 1 View Result Date: 01/03/2023 CLINICAL DATA:  Shortness of breath EXAM: PORTABLE CHEST 1 VIEW COMPARISON:  CT chest dated 12/17/2022 FINDINGS: Mild left lower lobe opacity, atelectasis versus pneumonia. Right lung is clear. No pleural effusion or pneumothorax. The heart is normal in size. Left chest power port terminates in the upper right atrium. IMPRESSION: Mild left lower lobe opacity, atelectasis versus pneumonia. Electronically Signed   By: Charline Bills M.D.   On: 01/03/2023 17:28   CT ABDOMEN  PELVIS W CONTRAST Result Date: 12/23/2022 CLINICAL DATA:  Status post recent drain placement pancreas cancer continued  pain EXAM: CT ABDOMEN AND PELVIS WITH CONTRAST TECHNIQUE: Multidetector CT imaging of the abdomen and pelvis was performed using the standard protocol following bolus administration of intravenous contrast. RADIATION DOSE REDUCTION: This exam was performed according to the departmental dose-optimization program which includes automated exposure control, adjustment of the mA and/or kV according to patient size and/or use of iterative reconstruction technique. CONTRAST:  85mL OMNIPAQUE IOHEXOL 300 MG/ML  SOLN COMPARISON:  CT 12/12/2022, MRI 12/09/2022, CT 12/08/2022, 07/24/2021 FINDINGS: Lower chest: Lung bases demonstrate no acute airspace disease. Similar small volume pericardial effusion. Small hiatal hernia and mild distal esophageal thickening. Small distal esophageal varices. Hepatobiliary: Multiple hypodense liver lesions, reference prior MRI for further description and characterisation. Interim placement of percutaneous cholecystostomy tube with decompression of the gallbladder. Heterogeneous enhancement pattern of the liver which may be due to combination of steatosis and perfusion abnormality. Central intra hepatic ducts and extrahepatic biliary dilatation, common duct measures up to 15 mm. Abrupt ductal cut off on coronal views due to pancreatic mass lesion. Pancreas: Poorly defined infiltrative mass involving the pancreatic head and neck, measures roughly 6 by 4.8 cm on series 2, image 28. Distal pancreatic atrophy with ductal dilatation. Soft tissue stranding at the pancreatic bed consistent with inflammation. Increased size of fluid collection between pancreas and stomach, now measuring 6.5 by 4.7 cm, previously 5.6 by 2.5 cm. Spleen: Normal in size without focal abnormality. Adrenals/Urinary Tract: Stable adrenal glands with diffuse thickening but no dominant mass. Kidneys show no  hydronephrosis. The bladder is normal Stomach/Bowel: Moderate fluid distension of the stomach. Wall thickening of the pylorus and duodenal C sweep with inflammation. Focal wall thickening and area of fluid near duodenal bulb, coronal series 5, image 62, potentially due to an area of ulceration. No free gas in the street gin. Remainder of the bowel shows no evidence for obstruction or wall thickening Vascular/Lymphatic: Advanced aortic atherosclerosis. No aneurysm. Narrowing of superior mesenteric artery by infiltrative pancreas mass. Occluded appearing splenic and superior mesenteric veins. 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 Numerous collateral vessels throughout the mesentery. Reproductive: Calcified uterine fibroids.  No adnexal mass Other: No free air. No large volume pelvic ascites. Increased organized fluid collection between the head of pancreas and the second portion of duodenum, this now measures 4 x 2.6 cm, previously 2.4 x 2.2 cm. Musculoskeletal: No acute osseous abnormality. IMPRESSION: 1. Interim placement of percutaneous cholecystostomy tube with decompression of the gallbladder. 2. Mixed findings of pancreatic mass and inflammatory change. Poorly defined infiltrative mass involving the pancreatic head and neck consistent with known pancreatic adenocarcinoma. Persistent stranding at the pancreatic bed consistent with pancreatic inflammatory process. Increased size of peripancreatic fluid collections compared to the most recent prior CT. 3. Distended stomach with wall thickening of the antrum and pylorus suspicious for inflammation. Possible small duodenal ulcer but no perforation. Wall thickening and inflammation of the duodenal C sweep. 4. Multiple hypodense liver lesions, reference prior MRI for further description and characterization. 5. Occluded appearing splenic and superior mesenteric veins. 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 Numerous collateral vessels throughout the mesentery. These findings  are present on the prior exam 6. Aortic atherosclerosis. Aortic Atherosclerosis (ICD10-I70.0). Electronically Signed   By: Jasmine Pang M.D.   On: 12/23/2022 18:51   IR IMAGING GUIDED PORT INSERTION Result Date: 12/18/2022 INDICATION: 63 year old with pancreatic cancer. Port-A-Cath needed for treatment. EXAM: FLUOROSCOPIC AND ULTRASOUND GUIDED PLACEMENT OF A SUBCUTANEOUS PORT. Physician: Rachelle Hora. Henn, MD MEDICATIONS: Moderate sedation ANESTHESIA/SEDATION: Moderate (conscious) sedation was employed during this  procedure. A total of Versed 1mg  and fentanyl 50 mcg was administered intravenously at the order of the provider performing the procedure. Total intra-service moderate sedation time: 57 minutes. Patient's level of consciousness and vital signs were monitored continuously by radiology nurse throughout the procedure under the supervision of the provider performing the procedure. FLUOROSCOPY: Radiation Exposure Index (as provided by the fluoroscopic device): 3 mGy Kerma COMPLICATIONS: None immediate. PROCEDURE: The risks of the procedure were explained to the patient. Informed consent was obtained. Patient was placed supine on the interventional table. Ultrasound confirmed a patent left internal jugular vein. The left chest and neck were cleaned with a skin antiseptic and a sterile drape was placed. Maximal barrier sterile technique was utilized including caps, mask, sterile gowns, sterile gloves, sterile drape, hand hygiene and skin antiseptic. The left neck was anesthetized with 1% lidocaine. Small incision was made in the left neck with a blade. Micropuncture set was placed in the left IJ with ultrasound guidance. The micropuncture wire was used for measurement purposes. The left chest was anesthetized with 1% lidocaine with epinephrine. #15 blade was used to make an incision and a subcutaneous port pocket was formed. 8 french Power Port was assembled. Subcutaneous tunnel was formed with a stiff tunneling  device. The port catheter was brought through the subcutaneous tunnel. The port was placed in the subcutaneous pocket. The micropuncture set was exchanged for a peel-away sheath. The catheter was placed through the peel-away sheath but the tip was too short in the upper SVC. Attempted to place a longer catheter over a wire but this was unsuccessful. As a result, the access was completely removed. Left jugular vein was punctured again using ultrasound guidance and micropuncture dilator set was placed. A new port catheter was tunneled between the subcutaneous pocket and the vein site. A new peel-away sheath was placed and the port catheter was advanced into the central venous system. Catheter tip was placed at the superior cavoatrial junction. The catheter was cut and attached to the port. Port was placed within the subcutaneous pocket. Catheter placement was confirmed with fluoroscopy. The port was accessed and flushed with saline. The port pocket was closed using two layers of absorbable sutures and Dermabond. The vein skin site was closed using a single layer of absorbable suture and Dermabond. Sterile dressings were applied. Patient tolerated the procedure well without an immediate complication. Ultrasound and fluoroscopic images were taken and saved for this procedure. IMPRESSION: Placement of a subcutaneous port device. Catheter tip at the superior cavoatrial junction and ready to be used. Electronically Signed   By: Richarda Overlie M.D.   On: 12/18/2022 14:03   CT CHEST WO CONTRAST Result Date: 12/17/2022 CLINICAL DATA:  Staging for recently diagnosed pancreatic cancer. EXAM: CT CHEST WITHOUT CONTRAST TECHNIQUE: Multidetector CT imaging of the chest was performed following the standard protocol without IV contrast. RADIATION DOSE REDUCTION: This exam was performed according to the departmental dose-optimization program which includes automated exposure control, adjustment of the mA and/or kV according to patient  size and/or use of iterative reconstruction technique. COMPARISON:  Chest, abdomen and pelvis CT dated 12/08/2022 and abdomen and pelvis CT dated 12/12/2022. Abdomen MRI dated 12/09/2022. Abdomen and pelvis CT dated 07/27/2021. Chest CT dated 07/24/2021. FINDINGS: Cardiovascular: Atheromatous calcifications, including the coronary arteries and aorta. Normal sized heart. Small pericardial effusion with an interval decrease in size with a current maximum thickness of 1.1 cm. Mediastinum/Nodes: No enlarged mediastinal or axillary lymph nodes. Thyroid gland and trachea are unremarkable.  Previously demonstrated diffuse low-density wall thickening involving the distal esophagus. Lungs/Pleura: Stable small area of ground-glass opacity in the medial left upper lobe. This remains less prominent than seen initially on 07/24/2021 suggesting an infectious or inflammatory process. Mild bilateral linear atelectasis and scarring. No solid lung nodules or pleural fluid. Upper Abdomen: The previously described large pancreatic head mass is partially included and unchanged. Again demonstrated is somewhat patchy, confluent low density in the liver on the right felt to represent a flow anomaly on previous studies. The previously demonstrated portal vein thrombus is not visible without intravenous contrast today. Multiple small liver cysts are unchanged. Musculoskeletal: Lower cervical spine degenerative changes and minimal thoracic spine degenerative changes. No evidence of bony metastatic disease. IMPRESSION: 1. No evidence of metastatic disease in the chest. 2. Stable small area of ground-glass opacity in the medial left upper lobe, likely an infectious or inflammatory process. 3. Small pericardial effusion with an interval decrease in size. 4. Previously demonstrated diffuse low-density wall thickening involving the distal esophagus. This most likely due to reflux esophagitis. 5. Calcific coronary artery and aortic atherosclerosis.  6. Stable recently described pancreatic head mass and probable perfusion abnormality in the liver. Without the previous studies for comparison, metastatic disease/direct spread of tumor in the liver could have a similar appearance. Aortic Atherosclerosis (ICD10-I70.0). Electronically Signed   By: Beckie Salts M.D.   On: 12/17/2022 11:43   IR Perc Cholecystostomy Addendum Date: 12/13/2022 ADDENDUM REPORT: 12/13/2022 20:18 ADDENDUM: Contrast: 5 mL Omnipaque 300 Electronically Signed   By: Richarda Overlie M.D.   On: 12/13/2022 20:18   Result Date: 12/13/2022 INDICATION: 62 year old with abdominal pain and severe gallbladder distension. New fluid around the gallbladder after upper endoscopy and EUS. Concern for a pancreatic malignancy. EXAM: IMAGE GUIDED PERCUTANEOUS CHOLECYSTOSTOMY TUBE PLACEMENT MEDICATIONS: Inpatient and receiving IV antibiotics. ANESTHESIA/SEDATION: Moderate (conscious) sedation was employed during this procedure. A total of Versed 1.5 mg and Fentanyl 50 mcg was administered intravenously by the radiology nurse. Total intra-service moderate Sedation Time: 15 minutes. The patient's level of consciousness and vital signs were monitored continuously by radiology nursing throughout the procedure under my direct supervision. FLUOROSCOPY: Radiation Exposure Index (as provided by the fluoroscopic device): 0.1 mGy Kerma COMPLICATIONS: None immediate. PROCEDURE: Informed written consent was obtained from the patient after a thorough discussion of the procedural risks, benefits and alternatives. All questions were addressed. A timeout was performed prior to the initiation of the procedure. Patient was placed supine. The right side of the abdomen was prepped and draped in sterile fashion. Maximal barrier sterile technique was utilized including caps, mask, sterile gowns, sterile gloves, sterile drape, hand hygiene and skin antiseptic. Ultrasound demonstrated a very distended gallbladder. The skin was  anesthetized using 1% lidocaine. Using ultrasound guidance, 21 gauge needle was directed into the gallbladder and a 0.018 wire was placed. A transitional dilator set was placed. Wire was advanced into the gallbladder and the tract was dilated to accommodate a 10 [place] multipurpose drain. Approximately 200 mL of dark green fluid was removed. The gallbladder was decompressed at the end of the procedure. Contrast injection confirmed placement in the gallbladder. Drain was sutured to skin and attached to a gravity bag. Fluid was sent for culture and cytology. Fluoroscopic and ultrasound images were taken and saved for documentation. FINDINGS: Gallbladder was severely distended. Fluid and edematous tissue around the gallbladder. Gallbladder was decompressed at the end of the procedure. 200 mL of fluid was removed. IMPRESSION: Successful image guided placement of a percutaneous  cholecystostomy tube. Electronically Signed: By: Richarda Overlie M.D. On: 12/13/2022 19:47   CT ABDOMEN PELVIS W CONTRAST Result Date: 12/12/2022 CLINICAL DATA:  Acute abdominal pain,  pancreatitis, pancreatic pass EXAM: CT ABDOMEN AND PELVIS WITH CONTRAST TECHNIQUE: Multidetector CT imaging of the abdomen and pelvis was performed using the standard protocol following bolus administration of intravenous contrast. RADIATION DOSE REDUCTION: This exam was performed according to the departmental dose-optimization program which includes automated exposure control, adjustment of the mA and/or kV according to patient size and/or use of iterative reconstruction technique. CONTRAST:  OMNIPAQUE IOHEXOL 300 MG/ML  SOLN COMPARISON:  12/09/2022, 12/08/2022 FINDINGS: Lower chest: No acute pleural or parenchymal lung disease. Stable pericardial effusion. Stable distal esophageal wall thickening. Small hiatal hernia. Hepatobiliary: Marked distension of the gallbladder is again noted, with intrahepatic duct dilation and proximal common bile duct distension,  due to occlusion by the infiltrative pancreatic mass image previously. Heterogeneous appearance of the liver parenchyma within the inferior right lobe and extending along the gallbladder fossa could likely due to known portal vein thrombosis or geographic hepatic steatosis as seen on recent MRI. Multiple liver hypodensities are unchanged, previously characterized on MRI. Pancreas: The infiltrative mass centered at the pancreatic head is again noted, measuring up to 6.1 x 4.4 cm, compatible with pancreatic neoplasm based on MRI findings. Upstream pancreatic parenchymal atrophy. Decreased fluid collections surrounding the pancreas, now measuring 5.6 x 2.5 cm image 36/2 and 2.4 x 2.2 cm reference image 44/2. Spleen: Normal in size without focal abnormality. Adrenals/Urinary Tract: Stable appearance of the adrenals and kidneys. Bladder is decompressed, limiting its evaluation. Stomach/Bowel: Prominent gastric distension again noted, with functional gastric outlet obstruction due to mass effect by the infiltrative pancreatic mass described above. Likely ulceration within the gastric antrum measuring up to 1.8 cm reference image 26/5. No evidence of perforation. No evidence of bowel obstruction or ileus. Vascular/Lymphatic: Continued occlusion of the splenic vein, SMV, and proximal aspect of the portal vein again noted due to extrinsic compression by the infiltrative pancreatic mass. There is nonocclusive thrombus within the main portal vein near the bifurcation. Extensive venous collaterals are seen throughout the mesentery unchanged since prior exam. Stable aortic atherosclerosis. No discrete pathologic adenopathy identified. Numerous subcentimeter mesenteric lymph nodes adjacent to the pancreatic mass in the upper abdomen are nonspecific. Reproductive: Calcified uterine fibroids unchanged. No adnexal masses. Other: Free fluid surrounds the distended gallbladder within the right upper quadrant. No free intraperitoneal  gas. No abdominal wall hernia. Musculoskeletal: No acute or destructive bony abnormalities. Reconstructed images demonstrate no additional findings. IMPRESSION: 1. Stable infiltrating pancreatic mass, with thrombosis of the splenic vein, SMV, and proximal portal vein. Nonocclusive thrombus within the main portal vein just proximal to the bifurcation. 2. Decreased size of the peripancreatic fluid collections as above. New simple appearing free fluid surrounding a distended gallbladder and right upper quadrant could reflect interval rupture of these peripancreatic fluid collections. 3. Continued gastric distension compatible with functional gastric outlet obstruction due to the infiltrating pancreatic mass. Suspected ulcer within the gastric antrum, without evidence of perforation. 4. Marked gallbladder distension, with intrahepatic and extrahepatic biliary duct dilation, due to downstream common bile duct obstruction due to the pancreatic mass as seen on recent MRI. No evidence of gallbladder wall thickening or calcified gallstones. 5.  Aortic Atherosclerosis (ICD10-I70.0). Electronically Signed   By: Sharlet Salina M.D.   On: 12/12/2022 17:56   Addendum I have seen the patient, examined her. I agree with the assessment and and plan  and have edited the notes.   Carliss is tolerating liquid well, no nausea or vomiting.  She also had a bowel movement this morning.  Not sure if she is able to take oral nutrition adequately, she wants to try.  We discussed that she may not be a candidate for chemo and/or surgery if she overall condition does not improve adequately.  I will follow-up again early next week if she is still in hospital.  Appreciate the hospitalist and palliative care teams' great assistance.   Malachy Mood  01/08/2023

## 2023-01-08 NOTE — Progress Notes (Signed)
PROGRESS NOTE    Kathryn Underwood  ZHY:[id number] DOB: Feb 27, 1960 DOA: 01/03/2023 PCP: Bary Leriche, PA-C   Brief Narrative:  62 year old female with previous alcohol induced pancreatitis, prior history of substance abuse, anxiety and depression, recent diagnosis of pancreatic adenocarcinoma and pancreatic pseudocyst during admission in November 2024 along with portal vein thrombosis along with cholecystostomy tube placement on 12/13/2022 with subsequent admission from 12/23/2022 to 12/27/2022 for partial gastric outlet obstruction and duodenal stenosis which was managed conservatively presented with worsening nausea, vomiting and weakness on presentation, her sodium was 119.  CT showed gastric outlet obstruction along with right lower lobe pneumonia.  WBCs of 31.  She was started on IV fluids and antibiotics.  GI/oncology/palliative care were consulted.  Assessment & Plan:   Gastric outlet obstruction, most likely from pancreatic malignancy and pseudocysts -GI following: Status post EGD on 01/06/2023 which showed LA grade D erosive esophagitis along with duodenal stenosis in the duodenal bulb and a cystic lesion in the pancreas, fine-needle aspiration for fluid performed.  Follow further GI recommendations.  Diet advancement as per GI.  AKI -From dehydration, poor oral intake, nausea and vomiting -Renal function has improved.  On IV fluids  Possible aspiration pneumonia -Continue Unasyn  Severe sepsis: Present on admission Lactic acidosis: Resolved -Due to aspiration pneumonia.  Presented with tachycardia, tachypnea, leukocytosis, pneumonia, AKI and lactic acidosis -Cultures negative so far.  Antibiotic plan as above.  Leukocytosis -Improving.  Monitor  Thrombocytosis -Possibly reactive.  Monitor intermittently  Anemia of chronic disease -From cancer and chemotherapy  Recent diagnosis of pancreatic adenocarcinoma and pancreatic pseudocyst Portal vein thrombosis Goals of  care -Oncology following.  Overall prognosis is guarded to poor.   -Palliative care following -Eliquis on hold for now.  Will resume once cleared by GI  Hyponatremia -Improving.  Monitor  Hypomagnesemia -Improved.  Cholecystostomy previously placed on 12/13/2022 -Outpatient follow-up with IR.  Currently draining and functioning.  History of prior alcohol abuse Bipolar disorder -Continue Remeron and Seroquel.  Continue as needed Ativan  History of COPD -Currently stable.  Cancer related cachexia Severe malnutrition -Consult nutrition   DVT prophylaxis: Eliquis on hold code Status: DNR Family Communication: Significant other and son at bedside on 01/07/2023.  None at bedside today. Disposition Plan: Status is: Inpatient Remains inpatient appropriate because: Of severity of illness   Consultants: Oncology/GI/palliative care  Procedures: EGD on 01/06/2023 Antimicrobials: Unasyn from 01/03/2023 onwards   Subjective: Patient seen and examined at bedside.  Poor historian.  Patient had episodes of tachycardia overnight as per nursing staff.  No agitation, seizures, vomiting reported.   Objective: Vitals:   01/08/23 0322 01/08/23 0400 01/08/23 0447 01/08/23 0613  BP: 92/67 94/67 102/66 (!) 84/51  Pulse: (!) 140 (!) 142 (!) 108 (!) 102  Resp: 18 18 16 18   Temp: 98.3 F (36.8 C)  97.7 F (36.5 C) 98.3 F (36.8 C)  TempSrc: Oral  Oral   SpO2: 95% 99% 96% 94%  Weight:      Height:        Intake/Output Summary (Last 24 hours) at 01/08/2023 2952 Last data filed at 01/08/2023 0556 Gross per 24 hour  Intake 2418.27 ml  Output 475 ml  Net 1943.27 ml   Filed Weights   01/03/23 1628  Weight: 45.4 kg    Examination:  General: No acute distress.  Remains on room air.  Extremely deconditioned looking and chronically ill.  Very thinly built. ENT/neck: No palpable neck masses or JVD elevation noted respiratory: Bilateral  decreased breath sounds at bases with scattered  crackles CVS: Intermittent tachycardic; S1 and S2 are heard Abdominal: Soft, nontender, distended mildly; no organomegaly, bowel sounds normally heard  extremities: No clubbing; mild lower extremity edema present  CNS: More awake this morning and answers some questions but still slow to respond.  No focal neurologic deficit.  Able to move extremities Lymph: No obvious cervical lymphadenopathy Skin: No obvious petechiae/rashes psych: flat affect.  Not agitated currently musculoskeletal: No obvious joint erythema/tenderness   Data Reviewed: I have personally reviewed following labs and imaging studies  CBC: Recent Labs  Lab 01/04/23 1150 01/05/23 0027 01/06/23 0333 01/07/23 0410 01/08/23 0430  WBC 32.3* 32.9* 22.9* 13.3* 12.8*  NEUTROABS  --  27.4* 16.2* 8.3* 6.4  HGB 9.3* 8.6* 8.1* 8.3* 8.8*  HCT 26.7* 26.0* 24.9* 25.7* 26.6*  MCV 95.4 98.9 100.4* 100.4* 98.5  PLT 468* 443* 435* 453* 538*   Basic Metabolic Panel: Recent Labs  Lab 01/04/23 0600 01/04/23 1150 01/04/23 1859 01/05/23 0026 01/06/23 0333 01/07/23 0410 01/08/23 0430  NA 124*   < > 128* 128* 131* 132* 133*  K 3.1*   < > 2.7* 3.3* 3.5 4.2 4.6  CL 79*   < > 84* 86* 95* 100 104  CO2 36*   < > 34* 35* 31 28 25   GLUCOSE 110*   < > 113* 105* 127* 116* 134*  BUN 43*   < > 30* 29* 15 7* <5*  CREATININE 0.55   < > 0.33* 0.35* 0.36* <0.30* 0.36*  CALCIUM 8.1*   < > 8.0* 7.9* 7.9* 7.7* 7.5*  MG 2.3  --   --   --  1.7 1.5* 2.1   < > = values in this interval not displayed.   GFR: Estimated Creatinine Clearance: 51.6 mL/min (A) (by C-G formula based on SCr of 0.36 mg/dL (L)). Liver Function Tests: Recent Labs  Lab 01/03/23 1850 01/04/23 0600 01/06/23 0333 01/07/23 0410 01/08/23 0430  AST 17 14* 11* 10* 19  ALT 10 9 10 9 13   ALKPHOS 139* 112 122 133* 379*  BILITOT 0.4 0.5 0.3 0.5 0.4  PROT 5.4* 5.0* 4.7* 4.5* 4.6*  ALBUMIN 2.6* 2.3* 2.0* 2.0* 2.0*   Recent Labs  Lab 01/03/23 1850  LIPASE 43   Recent  Labs  Lab 01/04/23 0600  AMMONIA 27   Coagulation Profile: No results for input(s): "INR", "PROTIME" in the last 168 hours. Cardiac Enzymes: No results for input(s): "CKTOTAL", "CKMB", "CKMBINDEX", "TROPONINI" in the last 168 hours. BNP (last 3 results) No results for input(s): "PROBNP" in the last 8760 hours. HbA1C: No results for input(s): "HGBA1C" in the last 72 hours. CBG: No results for input(s): "GLUCAP" in the last 168 hours. Lipid Profile: No results for input(s): "CHOL", "HDL", "LDLCALC", "TRIG", "CHOLHDL", "LDLDIRECT" in the last 72 hours. Thyroid Function Tests: No results for input(s): "TSH", "T4TOTAL", "FREET4", "T3FREE", "THYROIDAB" in the last 72 hours. Anemia Panel: No results for input(s): "VITAMINB12", "FOLATE", "FERRITIN", "TIBC", "IRON", "RETICCTPCT" in the last 72 hours. Sepsis Labs: Recent Labs  Lab 01/03/23 1716 01/03/23 1949  LATICACIDVEN 2.8* 1.1    Recent Results (from the past 240 hours)  Blood culture (routine x 2)     Status: None (Preliminary result)   Collection Time: 01/03/23  6:33 PM   Specimen: BLOOD  Result Value Ref Range Status   Specimen Description   Final    BLOOD PORTA CATH Performed at [facility], [address] [place], [place]  [address]    Special Requests   Final    BOTTLES DRAWN AEROBIC AND ANAEROBIC Blood Culture adequate volume Performed at [facility], [address] [place], [place] [address]    Culture   Final    NO GROWTH 4 DAYS Performed at [facility], [address], [place], [place] [address]    Report Status PENDING  Incomplete  Blood culture (routine x 2)     Status: None (Preliminary result)   Collection Time: 01/03/23  6:38 PM   Specimen: BLOOD  Result Value Ref Range Status   Specimen Description   Final    BLOOD LEFT ANTECUBITAL Performed at [facility], [address] [place], [place] [address]    Special Requests   Final    BOTTLES DRAWN AEROBIC AND  ANAEROBIC Blood Culture adequate volume Performed at [facility], [address] [place], [place] [address]    Culture   Final    NO GROWTH 4 DAYS Performed at [facility], [address], [place], [place] [address]    Report Status PENDING  Incomplete  Aerobic/Anaerobic Culture w Gram Stain (surgical/deep wound)     Status: None (Preliminary result)   Collection Time: 01/06/23  2:49 PM   Specimen: PATH GI biopsy; Tissue  Result Value Ref Range Status   Specimen Description   Final    CYSTS Performed at [facility], [address] [place], [place] [address]    Special Requests   Final    NONE Performed at [facility], [address], [place], [place] [address]    Gram Stain NO WBC SEEN NO ORGANISMS SEEN   Final   Culture   Final    NO GROWTH < 24 HOURS Performed at [facility], [address], [place], [place] [address]    Report Status PENDING  Incomplete         Radiology Studies: DG ESOPHAGUS DILATION Result Date: 01/06/2023 ESOPHAGEAL DILATATION: Fluoroscopy was provided for use by the requesting physician.  No images were obtained for radiographic interpretation.  DG C-Arm 1-60 Min-No Report Result Date: 01/06/2023 Fluoroscopy was utilized by the requesting physician.  No radiographic interpretation.        Scheduled Meds:  Chlorhexidine Gluconate Cloth  6 each Topical Daily   feeding supplement  237 mL Oral TID BM   gabapentin  200 mg Oral QHS   mirtazapine  45 mg Oral QHS   multivitamin with minerals  1 tablet Oral Daily   pantoprazole (PROTONIX) IV  40 mg Intravenous Q12H   QUEtiapine  200 mg Oral QHS   sucralfate  1 g Oral TID WC & HS   thiamine (VITAMIN B1) injection  100 mg Intravenous Daily   Vitamin D (Ergocalciferol)  50,000 Units Oral Q7 days   Continuous Infusions:  ampicillin-sulbactam (UNASYN) IV 3 g (01/08/23 0553)   dextrose 5 % and 0.9 % NaCl with KCl 40 mEq/L 75 mL/hr at  01/08/23 0451          Glade Lloyd, MD [facility] 01/08/2023, 8:22 AM

## 2023-01-08 NOTE — Progress Notes (Signed)
Sent secure chat to Anthoney Harada, NP: Pt is in yellow MEWS--- heart rate 120/125 earlier with anxiety- medicated with Ativan... No c/o pain.. Current heart rate of 130. No visible distress BP 97/74- RED mews  notified rapid response nurse- Mandy.. she just said to keep her updated. Pt remains warm, dry,no visible distress. In bed resting, afebrile.    Anthoney Harada, NP asked that pulse be checked manually. Nurse checked and she had an apical heart rate of 144. Informed her of this.  She then inquired about her oral hydration, edema. Nurse informed her that, per EPIC, it has been noted that she has consumed of oral intake. Orders received to administer bolus of NS and to reassess vitals when it is done.

## 2023-01-08 NOTE — Progress Notes (Signed)
Anthoney Harada, NP came up to unit. She stated that she had seen the 12 lead EKG results. No new orders at present time. Pt remains warm, dry, no visible distress.

## 2023-01-08 NOTE — Progress Notes (Signed)
Occupational Therapy Re-Evaluation Patient Details Name: Kathryn Underwood MRN: [id number] DOB: August 21, 1960 Today's Date: 01/08/2023   History of Present Illness Pt is a 62 y.o. female presenting with hypovolemic hyponatremia due to decreased PO intake, increased N/V from gastric outlet obstruction. Pt recently admitted 11/27-12/1 with SIRS, duodenal ulcer, and partial gastric outlet obstruction. Past medical history significant of alcoholism, pancreatitis with pseudocyst, anxiety, depression, neuropathy, and recent diagnosis of pancreatic adenocarcinoma. EGD on 01/06/23 with radial duodenal stent placement. Pt pulled out NG tube 12/11. L chest port and    Clinical Impression   Pt reporting that she feels fatigued, but agreeable to session working on functional activity tolerance and energy conservation education. Pt limited by decreased balance, strength, and generalized weakness. Currently functioning at setup for UB ADLs, CGA for LB ADLs and transfers. Anticipate need for frequent rest breaks and continued education on energy conservation. Pt may be agreeable to Kathryn Underwood if family can provide adequate support at hospital discharge, but would like to discuss with family. Pt performing sit <> stand transfers to work on strength and functional performance needed for toileting and ADLs. Anticipate the need for follow up [facility] OT services upon acute hospital DC.       If plan is discharge home, recommend the following: A little help with walking and/or transfers;A little help with bathing/dressing/bathroom;Assistance with cooking/housework;Assist for transportation;Help with stairs or ramp for entrance       Equipment Recommendations  None recommended by OT       Precautions / Restrictions Precautions Precautions: Fall Precaution Comments: R chest port, GI stent, biliary cook tube R Restrictions Weight Bearing Restrictions Per Provider Order: No      Mobility Bed Mobility Overal bed mobility: Needs  Assistance Bed Mobility: Supine to Sit     Supine to sit: HOB elevated, Supervision     General bed mobility comments: assist to raise trunk    Transfers Overall transfer level: Needs assistance Equipment used: None Transfers: Sit to/from Stand Sit to Stand: From elevated surface, Contact guard assist                  Balance Overall balance assessment: Mild deficits observed, not formally tested Sitting-balance support: Feet supported, No upper extremity supported Sitting balance-Leahy Scale: Good       Standing balance-Leahy Scale: Fair                             ADL either performed or assessed with clinical judgement   ADL Overall ADL's : Needs assistance/impaired;Independent Eating/Feeding: Independent;Sitting   Grooming: Set up;Sitting   Upper Body Bathing: Set up;Sitting   Lower Body Bathing: Contact guard assist;Sitting/lateral leans;Sit to/from stand   Upper Body Dressing : Set up;Sitting   Lower Body Dressing: Contact guard assist;Sitting/lateral leans;Sit to/from stand   Toilet Transfer: Contact guard assist;BSC/3in1;Stand-pivot   Toileting- Clothing Manipulation and Hygiene: Contact guard assist       Functional mobility during ADLs: Contact guard assist General ADL Comments: Pt limited by fatigue and decreased activity tolerance. Anticipate ADL sessions will require energy conservation strategies and frequent rest breaks.      Pertinent Vitals/Pain Pain Assessment Faces Pain Scale: Hurts little more     Extremity/Trunk Assessment Upper Extremity Assessment Upper Extremity Assessment: Generalized weakness   Lower Extremity Assessment Lower Extremity Assessment: Generalized weakness          Cognition Arousal: Lethargic Behavior During Therapy: WFL for tasks assessed/performed Overall Cognitive  Status: Within Functional Limits for tasks assessed                                          Exercises  Exercises: Other exercises Other Exercises Other Exercises: pt performing 4x STS with ~10 sec bouts in standing with rest breaks as needed for recovery     OT Goals(Current goals can be found in the care plan section) Acute Rehab OT Goals OT Goal Formulation: With patient Time For Goal Achievement: 01/22/23 Potential to Achieve Goals: Fair ADL Goals Pt Will Perform Grooming: with modified independence;standing Pt Will Perform Upper Body Dressing: Independently;sitting Pt Will Perform Lower Body Dressing: with modified independence;sit to/from stand Pt Will Transfer to Toilet: with modified independence;ambulating Pt Will Perform Toileting - Clothing Manipulation and hygiene: with modified independence;sit to/from stand  OT Frequency: Min 1X/week       AM-PAC OT "6 Clicks" Daily Activity     Outcome Measure Help from another person eating meals?: A Little Help from another person taking care of personal grooming?: A Little Help from another person toileting, which includes using toliet, bedpan, or urinal?: A Little Help from another person bathing (including washing, rinsing, drying)?: A Little Help from another person to put on and taking off regular upper body clothing?: A Little Help from another person to put on and taking off regular lower body clothing?: A Little 6 Click Score: 18   End of Session Nurse Communication: Mobility status  Activity Tolerance: Patient limited by fatigue Patient left: in bed;with call bell/phone within reach;with bed alarm set  OT Visit Diagnosis: Muscle weakness (generalized) (M62.81);Pain Pain - Right/Left:  ("everywhere")                Time: 1610-9604 OT Time Calculation (min): 19 min Charges:  OT General Charges $OT Visit: 1 Visit OT Treatments $Self Care/Home Management : 8-22 mins  [name] L. Marrissa Dai, OTR/L  01/08/23, 4:26 PM   [name] 01/08/2023, 4:24 PM

## 2023-01-08 NOTE — Progress Notes (Signed)
Messaged Anthoney Harada, NP that the 12 lead EKG has been completed and that current heart rate had gone down to 100.

## 2023-01-08 NOTE — Progress Notes (Signed)
Messaged Anthoney Harada, NP the following:  Bolus in... current BP is 94/67, heart rate 142, temp- will recheck due to her drinking cold water, respirations-18, 99% room air.  Orders received to do a 12 lead EKG and to let her know when it has been completed.

## 2023-01-09 DIAGNOSIS — K311 Adult hypertrophic pyloric stenosis: Secondary | ICD-10-CM | POA: Diagnosis not present

## 2023-01-09 DIAGNOSIS — R531 Weakness: Secondary | ICD-10-CM | POA: Diagnosis not present

## 2023-01-09 DIAGNOSIS — E871 Hypo-osmolality and hyponatremia: Secondary | ICD-10-CM | POA: Diagnosis not present

## 2023-01-09 DIAGNOSIS — C258 Malignant neoplasm of overlapping sites of pancreas: Secondary | ICD-10-CM | POA: Diagnosis not present

## 2023-01-09 LAB — CBC WITH DIFFERENTIAL/PLATELET
Abs Immature Granulocytes: 0.99 K/uL — ABNORMAL HIGH (ref 0.00–0.07)
Basophils Absolute: 0.2 K/uL — ABNORMAL HIGH (ref 0.0–0.1)
Basophils Relative: 1 %
Eosinophils Absolute: 1.1 K/uL — ABNORMAL HIGH (ref 0.0–0.5)
Eosinophils Relative: 7 %
HCT: 25.9 % — ABNORMAL LOW (ref 36.0–46.0)
Hemoglobin: 8.6 g/dL — ABNORMAL LOW (ref 12.0–15.0)
Immature Granulocytes: 6 %
Lymphocytes Relative: 20 %
Lymphs Abs: 3.2 K/uL (ref 0.7–4.0)
MCH: 32.5 pg (ref 26.0–34.0)
MCHC: 33.2 g/dL (ref 30.0–36.0)
MCV: 97.7 fL (ref 80.0–100.0)
Monocytes Absolute: 2.2 K/uL — ABNORMAL HIGH (ref 0.1–1.0)
Monocytes Relative: 14 %
Neutro Abs: 8.6 K/uL — ABNORMAL HIGH (ref 1.7–7.7)
Neutrophils Relative %: 52 %
Platelets: 559 K/uL — ABNORMAL HIGH (ref 150–400)
RBC: 2.65 MIL/uL — ABNORMAL LOW (ref 3.87–5.11)
RDW: 14.4 % (ref 11.5–15.5)
WBC: 16.2 K/uL — ABNORMAL HIGH (ref 4.0–10.5)
nRBC: 0 % (ref 0.0–0.2)

## 2023-01-09 LAB — COMPREHENSIVE METABOLIC PANEL
ALT: 22 U/L (ref 0–44)
AST: 39 U/L (ref 15–41)
Albumin: 2 g/dL — ABNORMAL LOW (ref 3.5–5.0)
Alkaline Phosphatase: 598 U/L — ABNORMAL HIGH (ref 38–126)
Anion gap: 5 (ref 5–15)
BUN: <5 mg/dL — ABNORMAL LOW (ref 8–23)
CO2: 24 mmol/L (ref 22–32)
Calcium: 7.6 mg/dL — ABNORMAL LOW (ref 8.9–10.3)
Chloride: 102 mmol/L (ref 98–111)
Creatinine, Ser: 0.36 mg/dL — ABNORMAL LOW (ref 0.44–1.00)
GFR, Estimated: >60 mL/min (ref >60)
Glucose, Bld: 145 mg/dL — ABNORMAL HIGH (ref 70–99)
Potassium: 4.3 mmol/L (ref 3.5–5.1)
Sodium: 131 mmol/L — ABNORMAL LOW (ref 135–145)
Total Bilirubin: 0.4 mg/dL (ref <1.2)
Total Protein: 4.6 g/dL — ABNORMAL LOW (ref 6.5–8.1)

## 2023-01-09 LAB — MAGNESIUM: Magnesium: 1.8 mg/dL (ref 1.7–2.4)

## 2023-01-09 NOTE — Plan of Care (Signed)
  Problem: Education: Goal: Knowledge of General Education information will improve Description: Including pain rating scale, medication(s)/side effects and non-pharmacologic comfort measures Outcome: Progressing   Problem: Activity: Goal: Risk for activity intolerance will decrease Outcome: Progressing   Problem: Nutrition: Goal: Adequate nutrition will be maintained Outcome: Progressing   Problem: Coping: Goal: Level of anxiety will decrease Outcome: Progressing   Problem: Elimination: Goal: Will not experience complications related to bowel motility Outcome: Progressing Goal: Will not experience complications related to urinary retention Outcome: Progressing   Problem: Pain Management: Goal: General experience of comfort will improve Outcome: Progressing   Problem: Safety: Goal: Ability to remain free from injury will improve Outcome: Progressing   Problem: Skin Integrity: Goal: Risk for impaired skin integrity will decrease Outcome: Progressing

## 2023-01-09 NOTE — Progress Notes (Signed)
PROGRESS NOTE    Kathryn Underwood  ZHY:[id number] DOB: 03-12-1960 DOA: 01/03/2023 PCP: Bary Leriche, PA-C   Brief Narrative:  62 year old female with previous alcohol induced pancreatitis, prior history of substance abuse, anxiety and depression, recent diagnosis of pancreatic adenocarcinoma and pancreatic pseudocyst during admission in November 2024 along with portal vein thrombosis along with cholecystostomy tube placement on 12/13/2022 with subsequent admission from 12/23/2022 to 12/27/2022 for partial gastric outlet obstruction and duodenal stenosis which was managed conservatively presented with worsening nausea, vomiting and weakness on presentation, her sodium was 119.  CT showed gastric outlet obstruction along with right lower lobe pneumonia.  WBCs of 31.  She was started on IV fluids and antibiotics.  GI/oncology/palliative care were consulted.  Assessment & Plan:   Gastric outlet obstruction, most likely from pancreatic malignancy and pseudocysts -GI following: Status post EGD/EUS on 01/06/2023 which showed LA grade D erosive esophagitis along with duodenal stenosis in the duodenal bulb which was stented and a cystic lesion in the pancreas, fine-needle aspiration for fluid performed.  GI signed off on 01/07/2023 and recommended full liquid diet for 48 hours and if tolerating full liquid diet, diet could be advanced to low residue diet. -Currently tolerating full liquid diet.  Will try and advance diet to low residue diet today.  AKI -From dehydration, poor oral intake, nausea and vomiting -Renal function has improved.  On IV fluids  Possible aspiration pneumonia -Continue Unasyn.  Today is day #7.  DC antibiotics after today.  Severe sepsis: Present on admission; resolved Lactic acidosis: Resolved -Due to aspiration pneumonia.  Presented with tachycardia, tachypnea, leukocytosis, pneumonia, AKI and lactic acidosis -Cultures negative so far.  Antibiotic plan as  above.  Leukocytosis -Worsening today.  Monitor  Thrombocytosis -Possibly reactive.  Monitor intermittently  Anemia of chronic disease -From cancer and chemotherapy  Recent diagnosis of pancreatic adenocarcinoma and pancreatic pseudocyst Portal vein thrombosis Goals of care -Oncology following.  Overall prognosis is guarded to poor.   -Palliative care following -Eliquis resumed from 01/08/2023 evening onwards as per GI recommendations   Hyponatremia -Mild.  Monitor  Hypomagnesemia -Improved.  Cholecystostomy previously placed on 12/13/2022 -Outpatient follow-up with IR.  Currently draining and functioning.  History of prior alcohol abuse Bipolar disorder -Continue Remeron and Seroquel.  Continue as needed Ativan  History of COPD -Currently stable.  Cancer related cachexia Severe malnutrition -Consult nutrition   DVT prophylaxis: Eliquis on hold code Status: DNR Family Communication: Significant other and son at bedside on 01/07/2023.  None at bedside today. Disposition Plan: Status is: Inpatient Remains inpatient appropriate because: Of severity of illness   Consultants: Oncology/GI/palliative care  Procedures: EGD on 01/06/2023 Antimicrobials: Unasyn from 01/03/2023 onwards   Subjective: Patient seen and examined at bedside.  Poor historian.  No fever, vomiting, agitation reported.  Tolerating full liquid diet.  Objective: Vitals:   01/08/23 0613 01/08/23 1015 01/08/23 2032 01/09/23 0559  BP: (!) 84/51 108/66 134/80 125/70  Pulse: (!) 102 100 (!) 105   Resp: 18 20 18 18   Temp: 98.3 F (36.8 C) 98 F (36.7 C) 98.5 F (36.9 C) 98.3 F (36.8 C)  TempSrc:   Oral Oral  SpO2: 94% 96% 98% 95%  Weight:      Height:        Intake/Output Summary (Last 24 hours) at 01/09/2023 0805 Last data filed at 01/09/2023 0521 Gross per 24 hour  Intake 1545.45 ml  Output 1165 ml  Net 380.45 ml   Filed Weights   01/03/23  1628  Weight: 45.4 kg     Examination:  General: On room air.  No distress.  Extremely deconditioned looking and chronically ill.  Very thinly built. ENT/neck: No obvious JVD elevation or palpable thyromegaly noted respiratory: Decreased breath sounds at bases with some scattered crackles  CVS: S1-S2 heard; mild intermittent tachycardia present Abdominal: Soft, nontender, slightly distended; no organomegaly, normal bowel sounds heard  extremities: Trace lower extremity edema present; no cyanosis CNS: Awake; still slow to respond but answers some questions.  No obvious focal deficits noted  lymph: No obvious palpable lymphadenopathy Skin: No obvious lesions/ecchymosis psych: Extremely flat affect.  Showing no signs of agitation currently musculoskeletal: No obvious joint swelling/erythema  Data Reviewed: I have personally reviewed following labs and imaging studies  CBC: Recent Labs  Lab 01/05/23 0027 01/06/23 0333 01/07/23 0410 01/08/23 0430 01/09/23 0221  WBC 32.9* 22.9* 13.3* 12.8* 16.2*  NEUTROABS 27.4* 16.2* 8.3* 6.4 8.6*  HGB 8.6* 8.1* 8.3* 8.8* 8.6*  HCT 26.0* 24.9* 25.7* 26.6* 25.9*  MCV 98.9 100.4* 100.4* 98.5 97.7  PLT 443* 435* 453* 538* 559*   Basic Metabolic Panel: Recent Labs  Lab 01/04/23 0600 01/04/23 1150 01/05/23 0026 01/06/23 0333 01/07/23 0410 01/08/23 0430 01/09/23 0221  NA 124*   < > 128* 131* 132* 133* 131*  K 3.1*   < > 3.3* 3.5 4.2 4.6 4.3  CL 79*   < > 86* 95* 100 104 102  CO2 36*   < > 35* 31 28 25 24   GLUCOSE 110*   < > 105* 127* 116* 134* 145*  BUN 43*   < > 29* 15 7* <5* <5*  CREATININE 0.55   < > 0.35* 0.36* <0.30* 0.36* 0.36*  CALCIUM 8.1*   < > 7.9* 7.9* 7.7* 7.5* 7.6*  MG 2.3  --   --  1.7 1.5* 2.1 1.8   < > = values in this interval not displayed.   GFR: Estimated Creatinine Clearance: 51.6 mL/min (A) (by C-G formula based on SCr of 0.36 mg/dL (L)). Liver Function Tests: Recent Labs  Lab 01/04/23 0600 01/06/23 0333 01/07/23 0410 01/08/23 0430  01/09/23 0221  AST 14* 11* 10* 19 39  ALT 9 10 9 13 22   ALKPHOS 112 122 133* 379* 598*  BILITOT 0.5 0.3 0.5 0.4 0.4  PROT 5.0* 4.7* 4.5* 4.6* 4.6*  ALBUMIN 2.3* 2.0* 2.0* 2.0* 2.0*   Recent Labs  Lab 01/03/23 1850  LIPASE 43   Recent Labs  Lab 01/04/23 0600  AMMONIA 27   Coagulation Profile: No results for input(s): "INR", "PROTIME" in the last 168 hours. Cardiac Enzymes: No results for input(s): "CKTOTAL", "CKMB", "CKMBINDEX", "TROPONINI" in the last 168 hours. BNP (last 3 results) No results for input(s): "PROBNP" in the last 8760 hours. HbA1C: No results for input(s): "HGBA1C" in the last 72 hours. CBG: No results for input(s): "GLUCAP" in the last 168 hours. Lipid Profile: No results for input(s): "CHOL", "HDL", "LDLCALC", "TRIG", "CHOLHDL", "LDLDIRECT" in the last 72 hours. Thyroid Function Tests: No results for input(s): "TSH", "T4TOTAL", "FREET4", "T3FREE", "THYROIDAB" in the last 72 hours. Anemia Panel: No results for input(s): "VITAMINB12", "FOLATE", "FERRITIN", "TIBC", "IRON", "RETICCTPCT" in the last 72 hours. Sepsis Labs: Recent Labs  Lab 01/03/23 1716 01/03/23 1949  LATICACIDVEN 2.8* 1.1    Recent Results (from the past 240 hours)  Blood culture (routine x 2)     Status: None   Collection Time: 01/03/23  6:33 PM   Specimen: BLOOD  Result Value  Ref Range Status   Specimen Description   Final    BLOOD PORTA CATH Performed at [facility], [address] [place], [place] [address]    Special Requests   Final    BOTTLES DRAWN AEROBIC AND ANAEROBIC Blood Culture adequate volume Performed at [facility], [address] [place], [place] [address]    Culture   Final    NO GROWTH 5 DAYS Performed at [facility], [address], [place], [place] [address]    Report Status 01/08/2023 FINAL  Final  Blood culture (routine x 2)     Status: None   Collection Time: 01/03/23  6:38 PM   Specimen: BLOOD  Result Value Ref  Range Status   Specimen Description   Final    BLOOD LEFT ANTECUBITAL Performed at [facility], [address] [place], [place] [address]    Special Requests   Final    BOTTLES DRAWN AEROBIC AND ANAEROBIC Blood Culture adequate volume Performed at [facility], [address] [place], [place] [address]    Culture   Final    NO GROWTH 5 DAYS Performed at [facility], [address] [place], [place] [address]    Report Status 01/08/2023 FINAL  Final  Aerobic/Anaerobic Culture w Gram Stain (surgical/deep wound)     Status: None (Preliminary result)   Collection Time: 01/06/23  2:49 PM   Specimen: PATH GI biopsy; Tissue  Result Value Ref Range Status   Specimen Description   Final    CYSTS Performed at [facility], [address], [place], [place] [address]    Special Requests   Final    NONE Performed at [facility], [address] [place], [place] [address]    Gram Stain   Final    NO WBC SEEN NO ORGANISMS SEEN Performed at [facility], [address] [place], [place] [address]    Culture   Final    RARE YEAST CULTURE REINCUBATED FOR BETTER GROWTH NO ANAEROBES ISOLATED; CULTURE IN PROGRESS FOR 5 DAYS    Report Status PENDING  Incomplete         Radiology Studies: No results found.       Scheduled Meds:  apixaban  5 mg Oral BID   Chlorhexidine Gluconate Cloth  6 each Topical Daily   feeding supplement  237 mL Oral TID BM   gabapentin  200 mg Oral QHS   mirtazapine  45 mg Oral QHS   multivitamin with minerals  1 tablet Oral Daily   pantoprazole (PROTONIX) IV  40 mg Intravenous Q12H   QUEtiapine  200 mg Oral QHS   sucralfate  1 g Oral TID WC & HS   thiamine (VITAMIN B1) injection  100 mg Intravenous Daily   Vitamin D (Ergocalciferol)  50,000 Units Oral Q7 days   Continuous Infusions:  ampicillin-sulbactam (UNASYN) IV 3 g (01/09/23 0518)   dextrose 5 % and 0.9 % NaCl 50 mL/hr at  01/08/23 1720          Glade Lloyd, MD [facility] 01/09/2023, 8:05 AM

## 2023-01-09 NOTE — Progress Notes (Signed)
Mobility Specialist - Progress Note   01/09/23 1117  Mobility  Activity Ambulated with assistance in hallway  Level of Assistance Contact guard assist, steadying assist  Assistive Device Front wheel walker  Distance Ambulated (ft) 60 ft  Range of Motion/Exercises Active  Activity Response Tolerated well  Mobility Referral Yes  Mobility visit 1 Mobility  Mobility Specialist Start Time (ACUTE ONLY) 1107  Mobility Specialist Stop Time (ACUTE ONLY) 1117  Mobility Specialist Time Calculation (min) (ACUTE ONLY) 10 min   Pt was found in bed and agreeable to ambulate. C/o back pain which limited distance. At EOS returned to bed with all needs met. Call bell in reach.  Billey Chang Mobility Specialist

## 2023-01-09 NOTE — Plan of Care (Signed)
?  Problem: Education: ?Goal: Knowledge of General Education information will improve ?Description: Including pain rating scale, medication(s)/side effects and non-pharmacologic comfort measures ?Outcome: Progressing ?  ?Problem: Health Behavior/Discharge Planning: ?Goal: Ability to manage health-related needs will improve ?Outcome: Progressing ?  ?Problem: Activity: ?Goal: Risk for activity intolerance will decrease ?Outcome: Progressing ?  ?Problem: Nutrition: ?Goal: Adequate nutrition will be maintained ?Outcome: Progressing ?  ?Problem: Elimination: ?Goal: Will not experience complications related to urinary retention ?Outcome: Progressing ?  ?

## 2023-01-10 MED ORDER — ORAL CARE MOUTH RINSE: 15.0000 mL | OROMUCOSAL | Status: DC | PRN

## 2023-01-10 NOTE — Progress Notes (Signed)
Mobility Specialist - Progress Note   01/10/23 1156  Mobility  Activity Ambulated with assistance in hallway  Level of Assistance Standby assist, set-up cues, supervision of patient - no hands on  Assistive Device Front wheel walker  Distance Ambulated (ft) 200 ft  Range of Motion/Exercises Active  Activity Response Tolerated well  Mobility Referral Yes  Mobility visit 1 Mobility  Mobility Specialist Start Time (ACUTE ONLY) 1145  Mobility Specialist Stop Time (ACUTE ONLY) 1156  Mobility Specialist Time Calculation (min) (ACUTE ONLY) 11 min   Pt was found in bed and agreeable to ambulate. No complaints with session. At EOS returned to bed with all needs met. Call bell in reach and son in room.  Billey Chang Mobility Specialist

## 2023-01-10 NOTE — Progress Notes (Addendum)
Daily Progress Note   Patient Name: Kathryn Underwood       Date: 01/10/2023 DOB: 1960-06-29  Age: 62 y.o. MRN#: [id number] Attending Physician: Glade Lloyd, MD Primary Care Physician: Allwardt, Crist Infante, PA-C Admit Date: 01/03/2023  Reason for Consultation/Follow-up: Establishing goals of care  Subjective: I saw and examined Kathryn Underwood today.  At time of my encounter she is sitting up in bed in no distress or getting ready to order dinner.  She reports she had a good day overall and her appetite seems to be returning a little.  Discussed with her regarding most recent conversation with Dr. Mosetta Putt with recommendation for consideration of palliative care due to poor functional and nutritional status.  She reports that she understands concerns regarding this but feels she still has a chance to get better and wants to continue to focus on improving nutrition and strength with the hope of further disease modifying therapy in the future.  Length of Stay: 7  Current Medications: Scheduled Meds:   apixaban  5 mg Oral BID   Chlorhexidine Gluconate Cloth  6 each Topical Daily   feeding supplement  237 mL Oral TID BM   gabapentin  200 mg Oral QHS   mirtazapine  45 mg Oral QHS   multivitamin with minerals  1 tablet Oral Daily   pantoprazole (PROTONIX) IV  40 mg Intravenous Q12H   QUEtiapine  200 mg Oral QHS   sucralfate  1 g Oral TID WC & HS   thiamine (VITAMIN B1) injection  100 mg Intravenous Daily   Vitamin D (Ergocalciferol)  50,000 Units Oral Q7 days    Continuous Infusions:    PRN Meds: haloperidol lactate, LORazepam, melatonin, morphine injection, ondansetron **OR** ondansetron (ZOFRAN) IV, mouth rinse, sodium chloride flush  Physical Exam         Awake alert sitting up in  bed Answers a few questions appropriately Appears thin and frail Regular work of breathing Does not have edema Mild generalized abdominal tenderness  Vital Signs: BP 95/73 (BP Location: Left Arm)   Pulse 100   Temp 98.6 F (37 C) (Oral)   Resp 18   Ht 5\' 3"  (1.6 m)   Wt 45.4 kg   SpO2 97%   BMI 17.71 kg/m  SpO2: SpO2: 97 % O2 Device: O2 Device: Room  Air O2 Flow Rate: O2 Flow Rate (L/min): 2 L/min  Intake/output summary:  Intake/Output Summary (Last 24 hours) at 01/10/2023 0934 Last data filed at 01/10/2023 0840 Gross per 24 hour  Intake 1281.33 ml  Output 1625 ml  Net -343.67 ml   LBM: Last BM Date : 01/10/23 Baseline Weight: Weight: 45.4 kg Most recent weight: Weight: 45.4 kg       Palliative Assessment/Data:      Patient Active Problem List   Diagnosis Date Noted   Hyponatremia 01/03/2023   SIRS (systemic inflammatory response syndrome) (HCC) 12/24/2022   Pancreatic adenocarcinoma (HCC) 12/16/2022   Partial gastric outlet obstruction 12/15/2022   Protein-calorie malnutrition, severe 12/09/2022   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/25/2021   Pancreas hemorrhage 07/27/2021   Alcohol induced acute pancreatitis without necrosis or infection 10/05/2017   Delirium tremens (HCC) 07/27/2021   Alcohol dependence with unspecified alcohol-induced disorder (HCC) 12/09/2015   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   Sinus tachycardia 04/25/2016   COPD (chronic obstructive pulmonary disease) (HCC) 07/27/2021   Uterine fibroid 07/27/2021   Pulmonary nodule 07/25/2021   Overlapping malignant neoplasm of pancreas (HCC) 01/04/2023   Failure to thrive (child) 01/04/2023   Gastric outlet obstruction 01/04/2023   Dehydration 12/28/2022   Abdominal pain 12/23/2022   Biliary obstruction 12/15/2022   High serum carbohydrate antigen 19-9 (CA19-9) 12/12/2022   Pancreatic cyst 12/12/2022   Abnormal CT scan, gastrointestinal tract 12/11/2022   Acute esophagitis 12/10/2022   Acute  on chronic pancreatitis (HCC) 12/09/2022   Protein malnutrition (HCC) 12/09/2022   Unintentional weight loss 12/09/2022   Esophageal dysphagia 12/09/2022   Dilation of pancreatic duct 12/09/2022   Pancreatic mass 12/08/2022   Multifocal atrial tachycardia (HCC) 08/31/2021   Insomnia 07/27/2021   Alcoholic ketoacidosis 10/04/2017   Epigastric pain 10/04/2017   MDD (major depressive disorder), recurrent severe, 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 Possibly also has hepatic metastases, has electrolyte abnormalities, has anxiety, malnutrition.  Recommendations/Plan: Palliative care consulted for goals of care discussions.  DNR DNI has been established as a limit of care.  While she is not currently a candidate for further disease modifying therapy, her hope is to improve her nutrition and functional status to the point where she can receive more disease modifying treatment.   She trust Dr. Mosetta Putt to continue to guide her with decision making regarding this.  Palliative team to follow peripherally and to monitor hospital course, monitor oral intake, monitor patient's overall condition.  Code Status:    Code Status  Orders  (From admission, onward)           Start     Ordered   01/06/23 1039  Do not attempt resuscitation (DNR)- Limited -Do Not Intubate (DNI)  (Code Status)  Continuous       Question Answer Comment  If pulseless and not breathing No CPR or chest compressions.   In Pre-Arrest Conditions (Patient Is Breathing and Has A Pulse) Do not intubate. Provide all appropriate non-invasive medical interventions. Avoid ICU transfer unless indicated or required.   Consent: Discussion documented in EHR or advanced directives reviewed      01/06/23 1038           Code Status History     Date Active Date Inactive Code Status Order ID Comments User Context   01/05/2023 1158 01/06/2023 1038 Do not attempt resuscitation (DNR) - Comfort care [id number]  Rhetta Mura, MD Inpatient   01/03/2023 2138 01/05/2023 1158 Full Code [id number]  Hillary Bow, DO ED   12/24/2022 0040 12/27/2022 2103 Full Code [id number]  Briscoe Deutscher, MD Inpatient   12/08/2022 1826 12/18/2022 2325 Full Code [id number]  Steffanie Rainwater, MD Inpatient   07/24/2021 1554 08/06/2021 2317 Full Code [id number]  Lurline Del, MD Inpatient   03/10/2018 0513 03/10/2018 1602 Full Code [id number]  Paula Libra, MD ED   11/08/2016 0923 11/12/2016 2026 Full Code [id number]  Zigmund Daniel., MD Inpatient   11/08/2016 0510 11/08/2016 0923 Full Code [id number]  Shon Baton, MD ED   04/25/2016 2039 04/28/2016 2011 Full Code [id number]  Jackie Plum, MD Inpatient       Prognosis:  Guarded   Discharge Planning: To Be Determined  Care plan was discussed with patient, son and RN in the room.   Thank you for allowing the Palliative Medicine Team to assist in the care of  this patient.  Mod MDM.   Total time 45 minutes  greater than 50%  of this time was spent counseling and coordinating care related to the above assessment and plan.  Romie Minus, MD  Please contact Palliative Medicine Team phone at [phone] for questions and concerns.

## 2023-01-10 NOTE — Plan of Care (Signed)
  Problem: Education: Goal: Knowledge of General Education information will improve Description: Including pain rating scale, medication(s)/side effects and non-pharmacologic comfort measures Outcome: Progressing   Problem: Health Behavior/Discharge Planning: Goal: Ability to manage health-related needs will improve Outcome: Progressing   Problem: Activity: Goal: Risk for activity intolerance will decrease Outcome: Progressing   Problem: Coping: Goal: Level of anxiety will decrease Outcome: Progressing   Problem: Elimination: Goal: Will not experience complications related to bowel motility Outcome: Progressing Goal: Will not experience complications related to urinary retention Outcome: Progressing   Problem: Pain Management: Goal: General experience of comfort will improve Outcome: Progressing   Problem: Safety: Goal: Ability to remain free from injury will improve Outcome: Progressing   Problem: Skin Integrity: Goal: Risk for impaired skin integrity will decrease Outcome: Progressing   Problem: Safety: Goal: Non-violent Restraint(s) Outcome: Completed/Met

## 2023-01-10 NOTE — Progress Notes (Signed)
PROGRESS NOTE    Kathryn Underwood  VZD:[id number] DOB: 09/09/1960 DOA: 01/03/2023 PCP: Bary Leriche, PA-C   Brief Narrative:  62 year old female with previous alcohol induced pancreatitis, prior history of substance abuse, anxiety and depression, recent diagnosis of pancreatic adenocarcinoma and pancreatic pseudocyst during admission in November 2024 along with portal vein thrombosis along with cholecystostomy tube placement on 12/13/2022 with subsequent admission from 12/23/2022 to 12/27/2022 for partial gastric outlet obstruction and duodenal stenosis which was managed conservatively presented with worsening nausea, vomiting and weakness on presentation, her sodium was 119.  CT showed gastric outlet obstruction along with right lower lobe pneumonia.  WBCs of 31.  She was started on IV fluids and antibiotics.  GI/oncology/palliative care were consulted.  Assessment & Plan:   Gastric outlet obstruction, most likely from pancreatic malignancy and pseudocysts -GI following: Status post EGD/EUS on 01/06/2023 which showed LA grade D erosive esophagitis along with duodenal stenosis in the duodenal bulb which was stented and a cystic lesion in the pancreas, fine-needle aspiration for fluid performed.  GI signed off on 01/07/2023 and recommended full liquid diet for 48 hours and if tolerating full liquid diet, diet could be advanced to low residue diet. -Currently tolerating soft/low fiber diet.    AKI -From dehydration, poor oral intake, nausea and vomiting -Renal function has improved.  IV fluids discontinued on 01/09/2023.  Possible aspiration pneumonia -Patient has received 7 days of IV Unasyn.  DC antibiotics.  Severe sepsis: Present on admission; resolved Lactic acidosis: Resolved -Due to aspiration pneumonia.  Presented with tachycardia, tachypnea, leukocytosis, pneumonia, AKI and lactic acidosis -Cultures negative so far.  Antibiotic plan as above.  Leukocytosis -Labs pending today.   Monitor.  Thrombocytosis -Possibly reactive.  Monitor intermittently  Anemia of chronic disease -From cancer and chemotherapy  Recent diagnosis of pancreatic adenocarcinoma and pancreatic pseudocyst Portal vein thrombosis Goals of care -Oncology following.  Overall prognosis is guarded to poor.   -Palliative care following -Eliquis resumed from 01/08/2023 evening onwards as per GI recommendations   Hyponatremia -Mild.  Monitor  Hypomagnesemia -Improved.  Cholecystostomy previously placed on 12/13/2022 -Outpatient follow-up with IR.  Currently draining and functioning.  History of prior alcohol abuse Bipolar disorder -Continue Remeron and Seroquel.  Continue as needed Ativan  History of COPD -Currently stable.  Cancer related cachexia Severe malnutrition/hypoalbuminemia -Consult nutrition   DVT prophylaxis: Eliquis  code Status: DNR Family Communication: Significant other and son at bedside on 01/07/2023.  None at bedside today. Disposition Plan: Status is: Inpatient Remains inpatient appropriate because: Of severity of illness   Consultants: Oncology/GI/palliative care  Procedures: EGD on 01/06/2023 Antimicrobials: Unasyn from 01/03/2023 onwards   Subjective: Patient seen and examined at bedside.  Poor historian.  Tolerating soft diet.  Feels okay.  Denies any worsening nausea, vomiting, fever or shortness of breath.   Objective: Vitals:   01/09/23 1900 01/09/23 2215 01/10/23 0218 01/10/23 0629  BP: 97/65 100/65 (!) 90/58 95/73  Pulse: (!) 101 (!) 103 (!) 106 100  Resp: 20 18 16 18   Temp: 98.3 F (36.8 C)  97.8 F (36.6 C) 98.6 F (37 C)  TempSrc: Oral  Oral Oral  SpO2: 94% 96% 97% 97%  Weight:      Height:        Intake/Output Summary (Last 24 hours) at 01/10/2023 0817 Last data filed at 01/10/2023 0345 Gross per 24 hour  Intake 1704.53 ml  Output 1525 ml  Net 179.53 ml   Filed Weights   01/03/23 1628  Weight: 45.4 kg     Examination:  General: No acute distress.  Remains on room air.  Chronically ill and deconditioned looking.  Very thinly built. ENT/neck: No obvious neck masses or JVD elevation noted  respiratory: Bilateral decreased breath sounds at bases with some crackles CVS: Intermittently tachycardic; S1 and S2 are heard  abdominal: Soft, nontender, distended mildly; no organomegaly, bowel sounds are normally heard  extremities: No clubbing; mild lower extremity edema present  CNS: Slow to respond but answering some questions.  No focal deficits noted lymph: No lymphadenopathy palpable Skin: No obvious petechiae/rashes  psych: Not agitated.  Mostly flat affect.   Musculoskeletal: No obvious joint deformity/tenderness  Data Reviewed: I have personally reviewed following labs and imaging studies  CBC: Recent Labs  Lab 01/05/23 0027 01/06/23 0333 01/07/23 0410 01/08/23 0430 01/09/23 0221  WBC 32.9* 22.9* 13.3* 12.8* 16.2*  NEUTROABS 27.4* 16.2* 8.3* 6.4 8.6*  HGB 8.6* 8.1* 8.3* 8.8* 8.6*  HCT 26.0* 24.9* 25.7* 26.6* 25.9*  MCV 98.9 100.4* 100.4* 98.5 97.7  PLT 443* 435* 453* 538* 559*   Basic Metabolic Panel: Recent Labs  Lab 01/04/23 0600 01/04/23 1150 01/05/23 0026 01/06/23 0333 01/07/23 0410 01/08/23 0430 01/09/23 0221  NA 124*   < > 128* 131* 132* 133* 131*  K 3.1*   < > 3.3* 3.5 4.2 4.6 4.3  CL 79*   < > 86* 95* 100 104 102  CO2 36*   < > 35* 31 28 25 24   GLUCOSE 110*   < > 105* 127* 116* 134* 145*  BUN 43*   < > 29* 15 7* <5* <5*  CREATININE 0.55   < > 0.35* 0.36* <0.30* 0.36* 0.36*  CALCIUM 8.1*   < > 7.9* 7.9* 7.7* 7.5* 7.6*  MG 2.3  --   --  1.7 1.5* 2.1 1.8   < > = values in this interval not displayed.   GFR: Estimated Creatinine Clearance: 51.6 mL/min (A) (by C-G formula based on SCr of 0.36 mg/dL (L)). Liver Function Tests: Recent Labs  Lab 01/04/23 0600 01/06/23 0333 01/07/23 0410 01/08/23 0430 01/09/23 0221  AST 14* 11* 10* 19 39  ALT 9 10 9 13 22    ALKPHOS 112 122 133* 379* 598*  BILITOT 0.5 0.3 0.5 0.4 0.4  PROT 5.0* 4.7* 4.5* 4.6* 4.6*  ALBUMIN 2.3* 2.0* 2.0* 2.0* 2.0*   Recent Labs  Lab 01/03/23 1850  LIPASE 43   Recent Labs  Lab 01/04/23 0600  AMMONIA 27   Coagulation Profile: No results for input(s): "INR", "PROTIME" in the last 168 hours. Cardiac Enzymes: No results for input(s): "CKTOTAL", "CKMB", "CKMBINDEX", "TROPONINI" in the last 168 hours. BNP (last 3 results) No results for input(s): "PROBNP" in the last 8760 hours. HbA1C: No results for input(s): "HGBA1C" in the last 72 hours. CBG: No results for input(s): "GLUCAP" in the last 168 hours. Lipid Profile: No results for input(s): "CHOL", "HDL", "LDLCALC", "TRIG", "CHOLHDL", "LDLDIRECT" in the last 72 hours. Thyroid Function Tests: No results for input(s): "TSH", "T4TOTAL", "FREET4", "T3FREE", "THYROIDAB" in the last 72 hours. Anemia Panel: No results for input(s): "VITAMINB12", "FOLATE", "FERRITIN", "TIBC", "IRON", "RETICCTPCT" in the last 72 hours. Sepsis Labs: Recent Labs  Lab 01/03/23 1716 01/03/23 1949  LATICACIDVEN 2.8* 1.1    Recent Results (from the past 240 hours)  Blood culture (routine x 2)     Status: None   Collection Time: 01/03/23  6:33 PM   Specimen: BLOOD  Result Value Ref Range Status  Specimen Description   Final    BLOOD PORTA CATH Performed at [facility], [address] [place], [place] [address]    Special Requests   Final    BOTTLES DRAWN AEROBIC AND ANAEROBIC Blood Culture adequate volume Performed at [facility], [address] [place], [place] [address]    Culture   Final    NO GROWTH 5 DAYS Performed at [facility], [address], [place], [place] [address]    Report Status 01/08/2023 FINAL  Final  Blood culture (routine x 2)     Status: None   Collection Time: 01/03/23  6:38 PM   Specimen: BLOOD  Result Value Ref Range Status   Specimen Description   Final    BLOOD LEFT  ANTECUBITAL Performed at [facility], [address] [place], [place] [address]    Special Requests   Final    BOTTLES DRAWN AEROBIC AND ANAEROBIC Blood Culture adequate volume Performed at [facility], [address], [place], [place] [address]    Culture   Final    NO GROWTH 5 DAYS Performed at [facility], [address] [place], [place] [address]    Report Status 01/08/2023 FINAL  Final  Aerobic/Anaerobic Culture w Gram Stain (surgical/deep wound)     Status: None (Preliminary result)   Collection Time: 01/06/23  2:49 PM   Specimen: PATH GI biopsy; Tissue  Result Value Ref Range Status   Specimen Description   Final    CYSTS Performed at [facility], [address], [place], [place] [address]    Special Requests   Final    NONE Performed at [facility], [address], [place], [place] [address]    Gram Stain   Final    NO WBC SEEN NO ORGANISMS SEEN Performed at [facility], [address], [place], [place] [address]    Culture   Final    RARE YEAST CULTURE REINCUBATED FOR BETTER GROWTH NO ANAEROBES ISOLATED; CULTURE IN PROGRESS FOR 5 DAYS    Report Status PENDING  Incomplete         Radiology Studies: No results found.       Scheduled Meds:  apixaban  5 mg Oral BID   Chlorhexidine Gluconate Cloth  6 each Topical Daily   feeding supplement  237 mL Oral TID BM   gabapentin  200 mg Oral QHS   mirtazapine  45 mg Oral QHS   multivitamin with minerals  1 tablet Oral Daily   pantoprazole (PROTONIX) IV  40 mg Intravenous Q12H   QUEtiapine  200 mg Oral QHS   sucralfate  1 g Oral TID WC & HS   thiamine (VITAMIN B1) injection  100 mg Intravenous Daily   Vitamin D (Ergocalciferol)  50,000 Units Oral Q7 days   Continuous Infusions:  ampicillin-sulbactam (UNASYN) IV 3 g (01/10/23 0631)          Glade Lloyd, MD [facility] 01/10/2023, 8:17 AM

## 2023-01-10 NOTE — Progress Notes (Signed)
   01/10/23 1030  Assess: MEWS Score  Temp 97.9 F (36.6 C)  BP 93/63  MAP (mmHg) 74  Pulse Rate (!) 107  Resp 16  Level of Consciousness Alert  SpO2 96 %  O2 Device Room Air  Patient Activity (if Appropriate) In bed  Assess: MEWS Score  MEWS Temp 0  MEWS Systolic 1  MEWS Pulse 1  MEWS RR 0  MEWS LOC 0  MEWS Score 2  MEWS Score Color Yellow  Assess: if the MEWS score is Yellow or Red  Were vital signs accurate and taken at a resting state? Yes  Does the patient meet 2 or more of the SIRS criteria? No  Does the patient have a confirmed or suspected source of infection? Yes  MEWS guidelines implemented  No, previously yellow, continue vital signs every 4 hours  Notify: Charge Nurse/RN  Name of Charge Nurse/RN Notified Jiles Prows, RN  Provider Notification  Provider Name/Title Glade Lloyd, MD  Date Provider Notified 01/10/23  Time Provider Notified 1040  Method of Notification Page  Notification Reason Other (Comment) (low BP and elevated HR)  Provider response No new orders  Date of Provider Response 01/10/23  Time of Provider Response 1041  Assess: SIRS CRITERIA  SIRS Temperature  0  SIRS Respirations  0  SIRS Pulse 1  SIRS WBC 0  SIRS Score Sum  1

## 2023-01-10 NOTE — Plan of Care (Signed)
  Problem: Nutrition: Goal: Adequate nutrition will be maintained Outcome: Progressing   

## 2023-01-10 NOTE — Progress Notes (Signed)
Patient had at least two pinkish stools during day shift. One occurrence was large with Type 5 consistency and the other occurrence was small with Type 6 consistency. RN made MD aware. MD told RN to continue to monitor. Will pass along to night shift RN.

## 2023-01-11 DIAGNOSIS — K311 Adult hypertrophic pyloric stenosis: Secondary | ICD-10-CM | POA: Diagnosis not present

## 2023-01-11 DIAGNOSIS — K863 Pseudocyst of pancreas: Secondary | ICD-10-CM | POA: Diagnosis not present

## 2023-01-11 DIAGNOSIS — R627 Adult failure to thrive: Secondary | ICD-10-CM | POA: Diagnosis not present

## 2023-01-11 DIAGNOSIS — C259 Malignant neoplasm of pancreas, unspecified: Secondary | ICD-10-CM | POA: Diagnosis not present

## 2023-01-11 LAB — CBC WITH DIFFERENTIAL/PLATELET
Abs Immature Granulocytes: 0.59 10*3/uL — ABNORMAL HIGH (ref 0.00–0.07)
Basophils Absolute: 0.1 10*3/uL (ref 0.0–0.1)
Basophils Relative: 1 %
Eosinophils Absolute: 0.5 10*3/uL (ref 0.0–0.5)
Eosinophils Relative: 4 %
HCT: 24.6 % — ABNORMAL LOW (ref 36.0–46.0)
Hemoglobin: 8.3 g/dL — ABNORMAL LOW (ref 12.0–15.0)
Immature Granulocytes: 4 %
Lymphocytes Relative: 20 %
Lymphs Abs: 2.9 10*3/uL (ref 0.7–4.0)
MCH: 32.5 pg (ref 26.0–34.0)
MCHC: 33.7 g/dL (ref 30.0–36.0)
MCV: 96.5 fL (ref 80.0–100.0)
Monocytes Absolute: 1.6 10*3/uL — ABNORMAL HIGH (ref 0.1–1.0)
Monocytes Relative: 11 %
Neutro Abs: 8.7 10*3/uL — ABNORMAL HIGH (ref 1.7–7.7)
Neutrophils Relative %: 60 %
Platelets: 536 10*3/uL — ABNORMAL HIGH (ref 150–400)
RBC: 2.55 MIL/uL — ABNORMAL LOW (ref 3.87–5.11)
RDW: 14.5 % (ref 11.5–15.5)
WBC: 14.4 10*3/uL — ABNORMAL HIGH (ref 4.0–10.5)
nRBC: 0 % (ref 0.0–0.2)

## 2023-01-11 LAB — AEROBIC/ANAEROBIC CULTURE W GRAM STAIN (SURGICAL/DEEP WOUND): Gram Stain: NONE SEEN

## 2023-01-11 LAB — COMPREHENSIVE METABOLIC PANEL
ALT: 23 U/L (ref 0–44)
AST: 23 U/L (ref 15–41)
Albumin: 2 g/dL — ABNORMAL LOW (ref 3.5–5.0)
Alkaline Phosphatase: 505 U/L — ABNORMAL HIGH (ref 38–126)
Anion gap: 6 (ref 5–15)
BUN: 11 mg/dL (ref 8–23)
CO2: 23 mmol/L (ref 22–32)
Calcium: 7.7 mg/dL — ABNORMAL LOW (ref 8.9–10.3)
Chloride: 101 mmol/L (ref 98–111)
Creatinine, Ser: 0.37 mg/dL — ABNORMAL LOW (ref 0.44–1.00)
GFR, Estimated: >60 mL/min (ref >60)
Glucose, Bld: 135 mg/dL — ABNORMAL HIGH (ref 70–99)
Potassium: 3.9 mmol/L (ref 3.5–5.1)
Sodium: 130 mmol/L — ABNORMAL LOW (ref 135–145)
Total Bilirubin: 0.5 mg/dL (ref <1.2)
Total Protein: 4.5 g/dL — ABNORMAL LOW (ref 6.5–8.1)

## 2023-01-11 LAB — MAGNESIUM: Magnesium: 2 mg/dL (ref 1.7–2.4)

## 2023-01-11 NOTE — Progress Notes (Signed)
Physical Therapy Treatment Patient Details Name: Kathryn Underwood MRN: [id number] DOB: Oct 28, 1960 Today's Date: 01/11/2023   History of Present Illness Pt is a 62 y.o. female presenting with hypovolemic hyponatremia due to decreased PO intake, increased N/V from gastric outlet obstruction. Pt recently admitted 11/27-12/1 with SIRS, duodenal ulcer, and partial gastric outlet obstruction. Past medical history significant of alcoholism, pancreatitis with pseudocyst, anxiety, depression, neuropathy, and recent diagnosis of pancreatic adenocarcinoma. EGD on 01/06/23 with radial duodenal stent placement. Pt pulled out NG tube 12/11.    PT Comments  Pt requested PT to come back. Pt wishing to ambulate at this time. Ambulation without a device on today-pt did well. Mild unsteadiness but no LOB. Pt tolerated distance well. Will continue to follow.     If plan is discharge home, recommend the following: A little help with walking and/or transfers;A little help with bathing/dressing/bathroom;Help with stairs or ramp for entrance;Assist for transportation;Assistance with cooking/housework   Can travel by private vehicle        Equipment Recommendations  None recommended by PT    Recommendations for Other Services       Precautions / Restrictions Precautions Precautions: Fall Precaution Comments: biliary tube Restrictions Weight Bearing Restrictions Per Provider Order: No     Mobility  Bed Mobility Overal bed mobility: Modified Independent                  Transfers Overall transfer level: Modified independent                      Ambulation/Gait Ambulation/Gait assistance: Supervision Gait Distance (Feet): 385 Feet Assistive device: None Gait Pattern/deviations: Step-through pattern       General Gait Details: Ambulated without device-fair gait speed. Some mild unsteadiness but no LOB. Tolerated distance well. O2 93%   Stairs             Wheelchair  Mobility     Tilt Bed    Modified Rankin (Stroke Patients Only)       Balance Overall balance assessment: Needs assistance         Standing balance support: During functional activity Standing balance-Leahy Scale: Fair                              Cognition Arousal: Alert Behavior During Therapy: WFL for tasks assessed/performed Overall Cognitive Status: Within Functional Limits for tasks assessed                                          Exercises      General Comments        Pertinent Vitals/Pain Pain Assessment Pain Assessment: No/denies pain    Home Living                          Prior Function            PT Goals (current goals can now be found in the care plan section) Progress towards PT goals: Progressing toward goals    Frequency    Min 1X/week      PT Plan      Co-evaluation              AM-PAC PT "6 Clicks" Mobility   Outcome Measure  Help needed turning from your back to  your side while in a flat bed without using bedrails?: None Help needed moving from lying on your back to sitting on the side of a flat bed without using bedrails?: None Help needed moving to and from a bed to a chair (including a wheelchair)?: None Help needed standing up from a chair using your arms (e.g., wheelchair or bedside chair)?: None Help needed to walk in hospital room?: A Little Help needed climbing 3-5 steps with a railing? : A Little 6 Click Score: 22    End of Session Equipment Utilized During Treatment: Gait belt Activity Tolerance: Patient tolerated treatment well Patient left: in bed;with call bell/phone within reach         Time: 1110-1120 PT Time Calculation (min) (ACUTE ONLY): 10 min  Charges:    $Gait Training: 8-22 mins PT General Charges $$ ACUTE PT VISIT: 1 Visit                         Faye Ramsay, PT Acute Rehabilitation  Office: [phone]

## 2023-01-11 NOTE — Plan of Care (Signed)

## 2023-01-11 NOTE — Progress Notes (Signed)
Nutrition Follow-up  DOCUMENTATION CODES:   Severe malnutrition in context of chronic illness  INTERVENTION:  - Soft diet per MD.  - Ensure Plus High Protein po TID, each supplement provides 350 kcal and 20 grams of protein. - Add Magic cup BID with meals, each supplement provides 290 kcal and 9 grams of protein - Encourage intake at all meals and of supplements.  - Continue Multivitamin with minerals daily - Continue vitamin D supplementation, 50,000 units weekly x2 months. - Monitor weight trends.   NUTRITION DIAGNOSIS:   Severe Malnutrition related to chronic illness (pancreatic adenocarcinoma) as evidenced by severe fat depletion, severe muscle depletion, energy intake < 75% for > or equal to 1 month. *ongoing  GOAL:   Patient will meet greater than or equal to 90% of their needs *progressing  MONITOR:   Diet advancement, Labs, Weight trends  REASON FOR ASSESSMENT:   Consult Assessment of nutrition requirement/status  ASSESSMENT:   63 y.o. female with history of previous EtOH induced pancreatitis, prior history of substance abuse, anxiety and depression who was very recently diagnosed with pancreatic adenocarcinoma during admission in November 2024 who presented with N/V and weakness. Admitted for AKI, severe hyponatremia, and underlying gastric outlet obstruction with pancreatic malignancy and pseudocysts.  12/8 Admit 12/11 pulled out NGT; s/p Upper EUS; advanced to CLD 12/12 FLD 12/14 Soft diet  Patient alert and oriented at time of visit, sitting up in bed. She reports eating very well over the past few days and good appetite. Documented to be consuming 100% of all meals. Also reports drinking Ensure each time it is offered and MAR confirms this. Discussed ongoing increased calorie and protein needs to support increased nutrient needs for cancer and healthy weight gain. Patient agreeable to also try Magic Cup to further support intake.   Admit weight:  100# Current weight: 102# I&O's: +5.4L  Medications reviewed and include: Remeron, MVI, 100mg  thiamine, 50,000 units vitamin D weekly x2 months  Labs reviewed:  Na 130   Diet Order:   Diet Order             DIET SOFT Room service appropriate? Yes; Fluid consistency: Thin  Diet effective now                   EDUCATION NEEDS:  Education needs have been addressed  Skin:  Skin Assessment: Reviewed RN Assessment  Last BM:  12/16  Height:  Ht Readings from Last 1 Encounters:  01/03/23 5\' 3"  (1.6 m)   Weight:  Wt Readings from Last 1 Encounters:  01/11/23 46.5 kg    BMI:  Body mass index is 18.17 kg/m.  Estimated Nutritional Needs:  Kcal:  1800-2000 kcals Protein:  80-90 grams Fluid:  >/= 1.8L    Kathryn Underwood RD, LDN Contact via Secure Chat.

## 2023-01-11 NOTE — Plan of Care (Signed)
  Problem: Education: Goal: Knowledge of General Education information will improve Description: Including pain rating scale, medication(s)/side effects and non-pharmacologic comfort measures Outcome: Progressing   Problem: Health Behavior/Discharge Planning: Goal: Ability to manage health-related needs will improve Outcome: Progressing   Problem: Clinical Measurements: Goal: Diagnostic test results will improve Outcome: Progressing Goal: Respiratory complications will improve Outcome: Progressing Goal: Cardiovascular complication will be avoided Outcome: Progressing   Problem: Activity: Goal: Risk for activity intolerance will decrease Outcome: Progressing   Problem: Nutrition: Goal: Adequate nutrition will be maintained Outcome: Progressing   Problem: Coping: Goal: Level of anxiety will decrease Outcome: Progressing   Problem: Elimination: Goal: Will not experience complications related to bowel motility Outcome: Progressing Goal: Will not experience complications related to urinary retention Outcome: Progressing   Problem: Pain Management: Goal: General experience of comfort will improve Outcome: Progressing   Problem: Safety: Goal: Ability to remain free from injury will improve Outcome: Progressing

## 2023-01-11 NOTE — Progress Notes (Signed)
   01/11/23 1300  Spiritual Encounters  Type of Visit Follow up  Care provided to: Patient  Referral source Chaplain assessment  Reason for visit Routine spiritual support  OnCall Visit No   Met patient at bedside who was being attended to by staff.  She askled for me to come back another time.   Will remain available as needed.    Resepctfully Submitted,   Rev. Dyke Brackett

## 2023-01-11 NOTE — Progress Notes (Signed)
Physical Therapy Treatment Patient Details Name: Kathryn Underwood MRN: [id number] DOB: 1960-05-05 Today's Date: 01/11/2023   History of Present Illness Pt is a 62 y.o. female presenting with hypovolemic hyponatremia due to decreased PO intake, increased N/V from gastric outlet obstruction. Pt recently admitted 11/27-12/1 with SIRS, duodenal ulcer, and partial gastric outlet obstruction. Past medical history significant of alcoholism, pancreatitis with pseudocyst, anxiety, depression, neuropathy, and recent diagnosis of pancreatic adenocarcinoma. EGD on 01/06/23 with radial duodenal stent placement. Pt pulled out NG tube 12/11.    PT Comments  Pt agreeable to working with therapy. RN in to give meds. Pt then requested to use BSC. Assisted pt over to BSC-left room at pt's request. Will continue to follow.     If plan is discharge home, recommend the following: A little help with walking and/or transfers;A little help with bathing/dressing/bathroom;Help with stairs or ramp for entrance;Assist for transportation;Assistance with cooking/housework   Can travel by private vehicle        Equipment Recommendations  None recommended by PT    Recommendations for Other Services       Precautions / Restrictions Precautions Precautions: Fall Precaution Comments: biliary tube Restrictions Weight Bearing Restrictions Per Provider Order: No     Mobility  Bed Mobility Overal bed mobility: Modified Independent                  Transfers Overall transfer level: Modified independent         Step pivot transfers: Supervision       General transfer comment: Step pivot over to BSC-left room at pt's request.    Ambulation/Gait Ambulation/Gait assistance: Supervision Gait Distance (Feet): 385 Feet Assistive device: None Gait Pattern/deviations: Step-through pattern       General Gait Details: Ambulated without device-fair gait speed. Some mild unsteadiness but no LOB. Tolerated  distance well. O2 93%   Stairs             Wheelchair Mobility     Tilt Bed    Modified Rankin (Stroke Patients Only)       Balance Overall balance assessment: Needs assistance         Standing balance support: During functional activity Standing balance-Leahy Scale: Fair                              Cognition Arousal: Alert Behavior During Therapy: WFL for tasks assessed/performed Overall Cognitive Status: Within Functional Limits for tasks assessed                                          Exercises      General Comments        Pertinent Vitals/Pain Pain Assessment Pain Assessment: No/denies pain    Home Living                          Prior Function            PT Goals (current goals can now be found in the care plan section) Progress towards PT goals: Progressing toward goals    Frequency    Min 1X/week      PT Plan      Co-evaluation              AM-PAC PT "6 Clicks" Mobility   Outcome Measure  Help needed turning from your back to your side while in a flat bed without using bedrails?: None Help needed moving from lying on your back to sitting on the side of a flat bed without using bedrails?: None Help needed moving to and from a bed to a chair (including a wheelchair)?: None Help needed standing up from a chair using your arms (e.g., wheelchair or bedside chair)?: None Help needed to walk in hospital room?: A Little Help needed climbing 3-5 steps with a railing? : A Little 6 Click Score: 22    End of Session Equipment Utilized During Treatment: Gait belt Activity Tolerance: Patient tolerated treatment well Patient left: with call bell/phone within reach (on bsc)   PT Visit Diagnosis: Muscle weakness (generalized) (M62.81);Difficulty in walking, not elsewhere classified (R26.2)     Time: 0921-0929 PT Time Calculation (min) (ACUTE ONLY): 8 min  Charges:    $Gait Training:  8-22 mins $Therapeutic Activity: 8-22 mins PT General Charges $$ ACUTE PT VISIT: 1 Visit                         Faye Ramsay, PT Acute Rehabilitation  Office: [phone]

## 2023-01-11 NOTE — Progress Notes (Signed)
Daily Progress Note   Patient Name: Kathryn Underwood       Date: 01/11/2023 DOB: [birth year]  Age: 62 y.o. MRN#: [id number] Attending Physician: Willeen Niece, MD Primary Care Physician: Allwardt, Crist Infante, PA-C Admit Date: 01/03/2023  Reason for Consultation/Follow-up: Establishing goals of care  Subjective: Patient is awake alert sitting up in bed, she states that her oral intake is improving, she states that her physical activity level is improving, she believes that her functional status is getting better.  She asks about whether she will be considered for chemotherapy.   Length of Stay: 8  Current Medications: Scheduled Meds:   apixaban  5 mg Oral BID   Chlorhexidine Gluconate Cloth  6 each Topical Daily   feeding supplement  237 mL Oral TID BM   gabapentin  200 mg Oral QHS   mirtazapine  45 mg Oral QHS   multivitamin with minerals  1 tablet Oral Daily   pantoprazole (PROTONIX) IV  40 mg Intravenous Q12H   QUEtiapine  200 mg Oral QHS   sucralfate  1 g Oral TID WC & HS   thiamine (VITAMIN B1) injection  100 mg Intravenous Daily   Vitamin D (Ergocalciferol)  50,000 Units Oral Q7 days    Continuous Infusions:    PRN Meds: haloperidol lactate, LORazepam, melatonin, morphine injection, ondansetron **OR** ondansetron (ZOFRAN) IV, mouth rinse, sodium chloride flush  Physical Exam         Awake alert sitting up in bed No distress Appears thin and frail Regular work of breathing Does not have edema Abdomen not distended  Vital Signs: BP 113/87 (BP Location: Left Arm)   Pulse 90   Temp 97.7 F (36.5 C) (Oral)   Resp 16   Ht 5\' 3"  (1.6 m)   Wt 46.5 kg   SpO2 98%   BMI 18.17 kg/m  SpO2: SpO2: 98 % O2 Device: O2 Device: Room Air O2 Flow Rate: O2 Flow Rate (L/min): 2  L/min  Intake/output summary:  Intake/Output Summary (Last 24 hours) at 01/11/2023 1241 Last data filed at 01/11/2023 1222 Gross per 24 hour  Intake 600 ml  Output 950 ml  Net -350 ml   LBM: Last BM Date : 01/11/23 Baseline Weight: Weight: 45.4 kg Most recent weight: Weight: 46.5 kg       Palliative  Assessment/Data:      Patient Active Problem List   Diagnosis Date Noted   Overlapping malignant neoplasm of pancreas (HCC) 01/04/2023   Failure to thrive (child) 01/04/2023   Gastric outlet obstruction 01/04/2023   Hyponatremia 01/03/2023   Dehydration 12/28/2022   SIRS (systemic inflammatory response syndrome) (HCC) 12/24/2022   Abdominal pain 12/23/2022   Pancreatic adenocarcinoma (HCC) 12/16/2022   Biliary obstruction 12/15/2022   Partial gastric outlet obstruction 12/15/2022   High serum carbohydrate antigen 19-9 (CA19-9) 12/12/2022   Pancreatic cyst 12/12/2022   Abnormal CT scan, gastrointestinal tract 12/11/2022   Acute esophagitis 12/10/2022   Acute on chronic pancreatitis (HCC) 12/09/2022   Protein malnutrition (HCC) 12/09/2022   Unintentional weight loss 12/09/2022   Esophageal dysphagia 12/09/2022   Dilation of pancreatic duct 12/09/2022   Protein-calorie malnutrition, severe 12/09/2022   Pancreatic mass 12/08/2022   Multifocal atrial tachycardia (HCC) 08/31/2021   Delirium tremens (HCC) 07/27/2021   Pancreas hemorrhage 07/27/2021   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   Uterine fibroid 07/27/2021   COPD (chronic obstructive pulmonary disease) (HCC) 07/27/2021   Insomnia 07/27/2021   Pulmonary nodule 07/25/2021   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/25/2021   Alcohol induced acute pancreatitis without necrosis or infection 10/05/2017   Alcoholic ketoacidosis 10/04/2017   Epigastric pain 10/04/2017   MDD (major depressive disorder), recurrent severe, 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   Alcohol withdrawal (HCC) 04/25/2016   Sinus tachycardia 04/25/2016    Transaminitis 04/25/2016   Leukocytosis 04/25/2016   Closed nondisplaced fracture of fifth right metatarsal bone 04/08/2016   Acute pain of right shoulder 04/08/2016   Vitamin D deficiency 12/11/2015   Alcohol dependence with unspecified alcohol-induced disorder (HCC) 12/09/2015   Fatigue 12/09/2015   Encounter for alcohol abuse counseling and surveillance 12/09/2015   Cough 01/04/2015   Tobacco abuse 01/04/2015   Depression with anxiety 01/04/2015    Palliative Care Assessment & Plan   Patient Profile: 62 year old lady with new diagnosis of pancreatic cancer with outlet obstruction malnutrition weakness.  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 Possibly also has hepatic metastases, has electrolyte abnormalities, has anxiety, malnutrition.  Recommendations/Plan: Palliative care consulted for goals of care discussions.  DNR DNI has been established as a limit of care.  Palliative care continues to follow for additional support and for ongoing goals of care discussions.  Patient was scheduled for cycle 1 chemotherapy with gem plus Abraxane 12-11 but that was postponed.  Patient has cholecystostomy drain, patient has been feeling much better, eating well drinking well and participating in physical therapy.    Code Status:    Code Status Orders  (From admission, onward)           Start     Ordered   01/06/23 1039  Do not attempt resuscitation (DNR)- Limited -Do Not Intubate (DNI)  (Code Status)  Continuous       Question Answer Comment  If pulseless and not breathing No  CPR or chest compressions.   In Pre-Arrest Conditions (Patient Is Breathing and Has A Pulse) Do not intubate. Provide all appropriate non-invasive medical interventions.  Avoid ICU transfer unless indicated or required.   Consent: Discussion documented in EHR or advanced directives reviewed      01/06/23 1038           Code Status History     Date Active Date Inactive Code Status Order ID Comments User Context   01/05/2023 1158 01/06/2023 1038 Do not attempt resuscitation (DNR) - Comfort care [id number]  Rhetta Mura, MD Inpatient   01/03/2023 2138 01/05/2023 1158 Full Code [id number]  Hillary Bow, DO ED   12/24/2022 0040 12/27/2022 2103 Full Code [id number]  Briscoe Deutscher, MD Inpatient   12/08/2022 1826 12/18/2022 2325 Full Code [id number]  Steffanie Rainwater, MD Inpatient   07/24/2021 1554 08/06/2021 2317 Full Code [id number]  Lurline Del, MD Inpatient   03/10/2018 0513 03/10/2018 1602 Full Code [id number]  Paula Libra, MD ED   11/08/2016 0923 11/12/2016 2026 Full Code [id number]  Zigmund Daniel., MD Inpatient   11/08/2016 0510 11/08/2016 0923 Full Code [id number]  Shon Baton, MD ED   04/25/2016 2039 04/28/2016 2011 Full Code [id number]  Jackie Plum, MD Inpatient       Prognosis:  Guarded   Discharge Planning: To Be Determined  Care plan was discussed with patient,  and RN in the room.   Thank you for allowing the Palliative Medicine Team to assist in the care of this patient.  Mod MDM.   Total time 45 minutes  greater than 50%  of this time was spent counseling and coordinating care related to the above assessment and plan.  Rosalin Hawking, MD  Please contact Palliative Medicine Team phone at [phone] for questions and concerns.

## 2023-01-11 NOTE — Progress Notes (Addendum)
Kathryn Underwood   DOB:[birth year]   ZO#:[id number]      ASSESSMENT & PLAN:  Pancreatic cancer [id number] -Patient was diagnosed in November 2024.-Scheduled for cycle 1 chemo with gem plus Abraxane 01/06/2023 but was postponed. -No inpatient chemotherapy planned. -Patient with cholecystostomy, draining, follow-up with IR as outpatient. -Patient reports feeling much better, eating well, drinking well.  May reconsider therapy options. -Patient will follow-up with medical oncology//Dr. Mosetta Putt  2.  Gastric outlet obstruction -Likely due to to pancreatic mass -Patient had EGD done on 01/06/2023 and now reports feeling much better.  3.  Severe protein and calorie malnutrition -Likely due to malignancy and poor oral intake -Continue to encourage liquid nutrition with boost, MVI supplementation.  4.  Sepsis -Present on admission.   -Cultures have been negative to date. -Continue antibiotics as ordered.  Code Status DNR-Limited   Subjective:  Patient seen awake alert and oriented x 3 sitting in her bed.  Reports that she feels much better.  Denies nausea/vomiting.  Admits to soft bowel movement and passing gas.  Reports that she is eating and drinking well, appetite much better.  Patient asking about start of first cycle of chemotherapy.   Objective:  Vitals:   01/10/23 2133 01/11/23 0500  BP: 112/72 113/87  Pulse: 90 90  Resp: 14 16  Temp: 97.7 F (36.5 C) 97.7 F (36.5 C)  SpO2: 98% 98%     Intake/Output Summary (Last 24 hours) at 01/11/2023 1111 Last data filed at 01/11/2023 9811 Gross per 24 hour  Intake 480 ml  Output 925 ml  Net -445 ml     REVIEW OF SYSTEMS:   Constitutional: Denies fevers, chills or abnormal night sweats Eyes: Denies blurriness of vision, double vision or watery eyes Ears, nose, mouth, throat, and face: Denies mucositis or sore throat Respiratory: Denies cough, dyspnea or wheezes Cardiovascular: Denies palpitation, chest discomfort or lower extremity  swelling Gastrointestinal:  Denies nausea, heartburn or change in bowel habits Skin: Denies abnormal skin rashes Lymphatics: Denies new lymphadenopathy or easy bruising Neurological: Denies numbness, tingling or new weaknesses Behavioral/Psych: Mood is stable, no new changes  All other systems were reviewed with the patient and are negative.  PHYSICAL EXAMINATION: ECOG PERFORMANCE STATUS: 1 - Symptomatic but completely ambulatory  Vitals:   01/10/23 2133 01/11/23 0500  BP: 112/72 113/87  Pulse: 90 90  Resp: 14 16  Temp: 97.7 F (36.5 C) 97.7 F (36.5 C)  SpO2: 98% 98%   Filed Weights   01/03/23 1628 01/11/23 1106  Weight: 100 lb (45.4 kg) 102 lb 9.6 oz (46.5 kg)    GENERAL: alert, cachectic, No distress and comfortable SKIN: + Pale skin color, texture, turgor are normal, no rashes or significant lesions EYES: normal, conjunctiva are pink and non-injected, sclera clear OROPHARYNX: no exudate, no erythema and lips, buccal mucosa, and tongue normal  NECK: supple, thyroid normal size, non-tender, without nodularity LYMPH: no palpable lymphadenopathy in the cervical, axillary or inguinal LUNGS: clear to auscultation and percussion with normal breathing effort HEART: regular rate & rhythm and no murmurs and no lower extremity edema ABDOMEN: abdomen soft, + mild tenderness and normal bowel sounds MUSCULOSKELETAL: no cyanosis of digits and no clubbing  PSYCH: alert & oriented x 3 with fluent speech NEURO: no focal motor/sensory deficits   All questions were answered. The patient knows to call the clinic with any problems, questions or concerns.   The total time spent in the appointment was 30 minutes encounter with patient including review of  chart and various tests results, discussions about plan of care and coordination of care plan  Dawson Bills, NP 01/11/2023 11:11 AM    Labs Reviewed:  Lab Results  Component Value Date   WBC 14.4 (H) 01/11/2023   HGB 8.3 (L)  01/11/2023   HCT 24.6 (L) 01/11/2023   MCV 96.5 01/11/2023   PLT 536 (H) 01/11/2023   Recent Labs    12/08/22 1231 12/08/22 1236 12/13/22 0611 12/14/22 0601 12/16/22 0554 12/17/22 0655 12/17/22 0657 01/08/23 0430 01/09/23 0221 01/11/23 0343  NA  --    < > 133*   < > 133*  --    < > 133* 131* 130*  K  --    < > 5.0   < > 2.9*  --    < > 4.6 4.3 3.9  CL  --    < > 105   < > 99  --    < > 104 102 101  CO2  --    < > 23   < > 26  --    < > 25 24 23   GLUCOSE  --    < > 144*   < > 114*  --    < > 134* 145* 135*  BUN  --    < > 17   < > 5*  --    < > <5* <5* 11  CREATININE  --    < > 0.76   < > 0.46  --    < > 0.36* 0.36* 0.37*  CALCIUM  --    < > 7.7*   < > 8.4*  --    < > 7.5* 7.6* 7.7*  GFRNONAA  --    < > >60   < > >60  --    < > >60 >60 >60  PROT 7.0   < > 5.2*   < > 5.8* 5.0*   < > 4.6* 4.6* 4.5*  ALBUMIN 3.5   < > 2.4*   < > 2.6*  2.6* 2.3*   < > 2.0* 2.0* 2.0*  AST 26   < > 67*   < > 20 12*   < > 19 39 23  ALT 9   < > 26   < > 22 14   < > 13 22 23   ALKPHOS 88   < > 188*   < > 224* 173*   < > 379* 598* 505*  BILITOT 0.6   < > 1.5*   < > 1.0 0.9   < > 0.4 0.4 0.5  BILIDIR 0.1  --  0.7*  --  0.3* 0.2  --   --   --   --   IBILI 0.5  --   --   --  0.7 0.7  --   --   --   --    < > = values in this interval not displayed.    Studies Reviewed:  DG ESOPHAGUS DILATION Result Date: 01/06/2023 ESOPHAGEAL DILATATION: Fluoroscopy was provided for use by the requesting physician.  No images were obtained for radiographic interpretation.  DG C-Arm 1-60 Min-No Report Result Date: 01/06/2023 Fluoroscopy was utilized by the requesting physician.  No radiographic interpretation.   DG Abd Portable 1V Result Date: 01/04/2023 CLINICAL DATA:  Nasogastric tube placement. EXAM: PORTABLE ABDOMEN - 1 VIEW COMPARISON:  Portable chest dated 01/03/2023 and abdomen and pelvis CT dated 01/03/2023. FINDINGS: Nasogastric tube side hole just distal to the  gastroesophageal junction and tip in the  proximal to mid stomach. With left jugular catheter tip at the superior cavoatrial junction. Resolved left basilar atelectasis. The included bowel-gas pattern is unremarkable. IMPRESSION: Nasogastric tube tip in the proximal to mid stomach. Electronically Signed   By: Beckie Salts M.D.   On: 01/04/2023 17:29   CT ABDOMEN PELVIS WO CONTRAST Result Date: 01/03/2023 CLINICAL DATA:  Abdominal pain. Nausea vomiting for several days. Reported history of pancreatic carcinoma. EXAM: CT ABDOMEN AND PELVIS WITHOUT CONTRAST TECHNIQUE: Multidetector CT imaging of the abdomen and pelvis was performed following the standard protocol without IV contrast. RADIATION DOSE REDUCTION: This exam was performed according to the departmental dose-optimization program which includes automated exposure control, adjustment of the mA and/or kV according to patient size and/or use of iterative reconstruction technique. COMPARISON:  12/23/2022. FINDINGS: Lower chest: Ill-defined peribronchovascular ground-glass and more confluent opacities, most evident in the left lower lobe, new since the prior CT. No pleural effusion. Small pericardial effusion similar to the prior CT. Fluid mildly distends the distal esophagus. Hepatobiliary: Ill-defined hypoattenuation involving segments 4 and 5 as well as portions of segment 6, better defined on the prior contrast enhanced CT, consistent with metastatic disease. There also several more well-defined hypoattenuating liver masses there are stable. No new liver abnormalities. Pigtail drainage catheter lies along the inferior margin of the right lobe, unchanged. Pancreas: Heterogeneous mass with ill-defined margins arises from the pancreatic head with coarse central calcifications. This is unchanged from the prior CT. Fluid collection abuts the right inferior pancreatic head and adjacent duodenum measuring 3.7 cm. Larger fluid collection abuts the proximal pancreatic tail, posterior to the stomach, 5.8 cm in  size. These are similar to the prior CT suspected to be pseudocysts. There is some fat stranding adjacent to the pancreas, similar to the prior CT. Spleen: Normal. Adrenals/Urinary Tract: Mild bilateral adrenal gland thickening consistent with hyperplasia. Normal kidneys, ureters and bladder. Stomach/Bowel: Significant gastric distension. No stomach wall thickening. Duodenum broadly abuts the pancreatic head mass. Wall not defined separate from the mass on this unenhanced exam. Small bowel and colon are normal in caliber. No wall thickening or inflammation. Mild to moderate increase in the colonic stool burden similar to the prior CT. Vascular/Lymphatic: Aortic atherosclerotic calcifications. No defined enlarged lymph nodes. Reproductive: Partly calcified uterine fibroids.  No adnexal masses. Other: No ascites. Musculoskeletal: No fracture or acute finding.  No bone lesion. IMPRESSION: 1. Significant stomach distention. This is suspected to be due to partial gastric outlet obstruction at the level of the duodenum due to the large pancreatic mass. 2. Pancreatic mass, liver lesions and right upper quadrant drainage catheter are without significant change from the recent prior CT, better defined on the prior study due to the presence of intravenous contrast. 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 These are also unchanged from the recent prior CT. Electronically Signed   By: Amie Portland M.D.   On: 01/03/2023 19:09   DG Chest Portable 1 View Result Date: 01/03/2023 CLINICAL DATA:  Shortness of breath EXAM: PORTABLE CHEST 1 VIEW COMPARISON:  CT chest dated 12/17/2022 FINDINGS: Mild left lower lobe opacity, atelectasis versus pneumonia. Right lung is clear. No pleural effusion or pneumothorax. The heart is normal in size. Left chest power port terminates in the upper right atrium. IMPRESSION: Mild left lower lobe opacity, atelectasis versus pneumonia. Electronically  Signed   By: Charline Bills M.D.   On: 01/03/2023 17:28   CT ABDOMEN PELVIS W  CONTRAST Result Date: 12/23/2022 CLINICAL DATA:  Status post recent drain placement pancreas cancer continued pain EXAM: CT ABDOMEN AND PELVIS WITH CONTRAST TECHNIQUE: Multidetector CT imaging of the abdomen and pelvis was performed using the standard protocol following bolus administration of intravenous contrast. RADIATION DOSE REDUCTION: This exam was performed according to the departmental dose-optimization program which includes automated exposure control, adjustment of the mA and/or kV according to patient size and/or use of iterative reconstruction technique. CONTRAST:  85mL OMNIPAQUE IOHEXOL 300 MG/ML  SOLN COMPARISON:  CT 12/12/2022, MRI 12/09/2022, CT 12/08/2022, 07/24/2021 FINDINGS: Lower chest: Lung bases demonstrate no acute airspace disease. Similar small volume pericardial effusion. Small hiatal hernia and mild distal esophageal thickening. Small distal esophageal varices. Hepatobiliary: Multiple hypodense liver lesions, reference prior MRI for further description and characterisation. Interim placement of percutaneous cholecystostomy tube with decompression of the gallbladder. Heterogeneous enhancement pattern of the liver which may be due to combination of steatosis and perfusion abnormality. Central intra hepatic ducts and extrahepatic biliary dilatation, common duct measures up to 15 mm. Abrupt ductal cut off on coronal views due to pancreatic mass lesion. Pancreas: Poorly defined infiltrative mass involving the pancreatic head and neck, measures roughly 6 by 4.8 cm on series 2, image 28. Distal pancreatic atrophy with ductal dilatation. Soft tissue stranding at the pancreatic bed consistent with inflammation. Increased size of fluid collection between pancreas and stomach, now measuring 6.5 by 4.7 cm, previously 5.6 by 2.5 cm. Spleen: Normal in size without focal abnormality. Adrenals/Urinary Tract: Stable  adrenal glands with diffuse thickening but no dominant mass. Kidneys show no hydronephrosis. The bladder is normal Stomach/Bowel: Moderate fluid distension of the stomach. Wall thickening of the pylorus and duodenal C sweep with inflammation. Focal wall thickening and area of fluid near duodenal bulb, coronal series 5, image 62, potentially due to an area of ulceration. No free gas in the street gin. Remainder of the bowel shows no evidence for obstruction or wall thickening Vascular/Lymphatic: Advanced aortic atherosclerosis. No aneurysm. Narrowing of superior mesenteric artery by infiltrative pancreas mass. Occluded appearing splenic and superior mesenteric veins. 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 Numerous collateral vessels throughout the mesentery. Reproductive: Calcified uterine fibroids.  No adnexal mass Other: No free air. No large volume pelvic ascites. Increased organized fluid collection between the head of pancreas and the second portion of duodenum, this now measures 4 x 2.6 cm, previously 2.4 x 2.2 cm. Musculoskeletal: No acute osseous abnormality. IMPRESSION: 1. Interim placement of percutaneous cholecystostomy tube with decompression of the gallbladder. 2. Mixed findings of pancreatic mass and inflammatory change. Poorly defined infiltrative mass involving the pancreatic head and neck consistent with known pancreatic adenocarcinoma. Persistent stranding at the pancreatic bed consistent with pancreatic inflammatory process. Increased size of peripancreatic fluid collections compared to the most recent prior CT. 3. Distended stomach with wall thickening of the antrum and pylorus suspicious for inflammation. Possible small duodenal ulcer but no perforation. Wall thickening and inflammation of the duodenal C sweep. 4. Multiple hypodense liver lesions, reference prior MRI for further description and characterization. 5. Occluded appearing splenic and superior mesenteric veins. Thrombus at the portal  confluence. Numerous collateral vessels throughout the mesentery. These findings are present on the prior exam 6. Aortic atherosclerosis. Aortic Atherosclerosis (ICD10-I70.0). Electronically Signed   By: Jasmine Pang M.D.   On: 12/23/2022 18:51   IR IMAGING GUIDED PORT INSERTION Result Date: 12/18/2022 INDICATION: 62 year old with pancreatic cancer. Port-A-Cath needed for treatment. EXAM: FLUOROSCOPIC AND ULTRASOUND GUIDED PLACEMENT OF A SUBCUTANEOUS PORT. Physician:  Adam R. Henn, MD MEDICATIONS: Moderate sedation ANESTHESIA/SEDATION: Moderate (conscious) sedation was employed during this procedure. A total of Versed 1mg  and fentanyl 50 mcg was administered intravenously at the order of the provider performing the procedure. Total intra-service moderate sedation time: 57 minutes. Patient's level of consciousness and vital signs were monitored continuously by radiology nurse throughout the procedure under the supervision of the provider performing the procedure. FLUOROSCOPY: Radiation Exposure Index (as provided by the fluoroscopic device): 3 mGy Kerma COMPLICATIONS: None immediate. PROCEDURE: The risks of the procedure were explained to the patient. Informed consent was obtained. Patient was placed supine on the interventional table. Ultrasound confirmed a patent left internal jugular vein. The left chest and neck were cleaned with a skin antiseptic and a sterile drape was placed. Maximal barrier sterile technique was utilized including caps, mask, sterile gowns, sterile gloves, sterile drape, hand hygiene and skin antiseptic. The left neck was anesthetized with 1% lidocaine. Small incision was made in the left neck with a blade. Micropuncture set was placed in the left IJ with ultrasound guidance. The micropuncture wire was used for measurement purposes. The left chest was anesthetized with 1% lidocaine with epinephrine. #15 blade was used to make an incision and a subcutaneous port pocket was formed. 8 french  Power Port was assembled. Subcutaneous tunnel was formed with a stiff tunneling device. The port catheter was brought through the subcutaneous tunnel. The port was placed in the subcutaneous pocket. The micropuncture set was exchanged for a peel-away sheath. The catheter was placed through the peel-away sheath but the tip was too short in the upper SVC. Attempted to place a longer catheter over a wire but this was unsuccessful. As a result, the access was completely removed. Left jugular vein was punctured again using ultrasound guidance and micropuncture dilator set was placed. A new port catheter was tunneled between the subcutaneous pocket and the vein site. A new peel-away sheath was placed and the port catheter was advanced into the central venous system. Catheter tip was placed at the superior cavoatrial junction. The catheter was cut and attached to the port. Port was placed within the subcutaneous pocket. Catheter placement was confirmed with fluoroscopy. The port was accessed and flushed with saline. The port pocket was closed using two layers of absorbable sutures and Dermabond. The vein skin site was closed using a single layer of absorbable suture and Dermabond. Sterile dressings were applied. Patient tolerated the procedure well without an immediate complication. Ultrasound and fluoroscopic images were taken and saved for this procedure. IMPRESSION: Placement of a subcutaneous port device. Catheter tip at the superior cavoatrial junction and ready to be used. Electronically Signed   By: Richarda Overlie M.D.   On: 12/18/2022 14:03   CT CHEST WO CONTRAST Result Date: 12/17/2022 CLINICAL DATA:  Staging for recently diagnosed pancreatic cancer. EXAM: CT CHEST WITHOUT CONTRAST TECHNIQUE: Multidetector CT imaging of the chest was performed following the standard protocol without IV contrast. RADIATION DOSE REDUCTION: This exam was performed according to the departmental dose-optimization program which includes  automated exposure control, adjustment of the mA and/or kV according to patient size and/or use of iterative reconstruction technique. COMPARISON:  Chest, abdomen and pelvis CT dated 12/08/2022 and abdomen and pelvis CT dated 12/12/2022. Abdomen MRI dated 12/09/2022. Abdomen and pelvis CT dated 07/27/2021. Chest CT dated 07/24/2021. FINDINGS: Cardiovascular: Atheromatous calcifications, including the coronary arteries and aorta. Normal sized heart. Small pericardial effusion with an interval decrease in size with a current maximum thickness of 1.1  cm. Mediastinum/Nodes: No enlarged mediastinal or axillary lymph nodes. Thyroid gland and trachea are unremarkable. Previously demonstrated diffuse low-density wall thickening involving the distal esophagus. Lungs/Pleura: Stable small area of ground-glass opacity in the medial left upper lobe. This remains less prominent than seen initially on 07/24/2021 suggesting an infectious or inflammatory process. Mild bilateral linear atelectasis and scarring. No solid lung nodules or pleural fluid. Upper Abdomen: The previously described large pancreatic head mass is partially included and unchanged. Again demonstrated is somewhat patchy, confluent low density in the liver on the right felt to represent a flow anomaly on previous studies. The previously demonstrated portal vein thrombus is not visible without intravenous contrast today. Multiple small liver cysts are unchanged. Musculoskeletal: Lower cervical spine degenerative changes and minimal thoracic spine degenerative changes. No evidence of bony metastatic disease. IMPRESSION: 1. No evidence of metastatic disease in the chest. 2. Stable small area of ground-glass opacity in the medial left upper lobe, likely an infectious or inflammatory process. 3. Small pericardial effusion with an interval decrease in size. 4. Previously demonstrated diffuse low-density wall thickening involving the distal esophagus. This most likely due  to reflux esophagitis. 5. Calcific coronary artery and aortic atherosclerosis. 6. Stable recently described pancreatic head mass and probable perfusion abnormality in the liver. Without the previous studies for comparison, metastatic disease/direct spread of tumor in the liver could have a similar appearance. Aortic Atherosclerosis (ICD10-I70.0). Electronically Signed   By: Beckie Salts M.D.   On: 12/17/2022 11:43   IR Perc Cholecystostomy Addendum Date: 12/13/2022 ADDENDUM REPORT: 12/13/2022 20:18 ADDENDUM: Contrast: 5 mL Omnipaque 300 Electronically Signed   By: Richarda Overlie M.D.   On: 12/13/2022 20:18   Result Date: 12/13/2022 INDICATION: 62 year old with abdominal pain and severe gallbladder distension. New fluid around the gallbladder after upper endoscopy and EUS. Concern for a pancreatic malignancy. EXAM: IMAGE GUIDED PERCUTANEOUS CHOLECYSTOSTOMY TUBE PLACEMENT MEDICATIONS: Inpatient and receiving IV antibiotics. ANESTHESIA/SEDATION: Moderate (conscious) sedation was employed during this procedure. A total of Versed 1.5 mg and Fentanyl 50 mcg was administered intravenously by the radiology nurse. Total intra-service moderate Sedation Time: 15 minutes. The patient's level of consciousness and vital signs were monitored continuously by radiology nursing throughout the procedure under my direct supervision. FLUOROSCOPY: Radiation Exposure Index (as provided by the fluoroscopic device): 0.1 mGy Kerma COMPLICATIONS: None immediate. PROCEDURE: Informed written consent was obtained from the patient after a thorough discussion of the procedural risks, benefits and alternatives. All questions were addressed. A timeout was performed prior to the initiation of the procedure. Patient was placed supine. The right side of the abdomen was prepped and draped in sterile fashion. Maximal barrier sterile technique was utilized including caps, mask, sterile gowns, sterile gloves, sterile drape, hand hygiene and skin  antiseptic. Ultrasound demonstrated a very distended gallbladder. The skin was anesthetized using 1% lidocaine. Using ultrasound guidance, 21 gauge needle was directed into the gallbladder and a 0.018 wire was placed. A transitional dilator set was placed. Wire was advanced into the gallbladder and the tract was dilated to accommodate a 10 [place] multipurpose drain. Approximately 200 mL of dark green fluid was removed. The gallbladder was decompressed at the end of the procedure. Contrast injection confirmed placement in the gallbladder. Drain was sutured to skin and attached to a gravity bag. Fluid was sent for culture and cytology. Fluoroscopic and ultrasound images were taken and saved for documentation. FINDINGS: Gallbladder was severely distended. Fluid and edematous tissue around the gallbladder. Gallbladder was decompressed at the end of the  procedure. 200 mL of fluid was removed. IMPRESSION: Successful image guided placement of a percutaneous cholecystostomy tube. Electronically Signed: By: Richarda Overlie M.D. On: 12/13/2022 19:47   CT ABDOMEN PELVIS W CONTRAST Result Date: 12/12/2022 CLINICAL DATA:  Acute abdominal pain,  pancreatitis, pancreatic pass EXAM: CT ABDOMEN AND PELVIS WITH CONTRAST TECHNIQUE: Multidetector CT imaging of the abdomen and pelvis was performed using the standard protocol following bolus administration of intravenous contrast. RADIATION DOSE REDUCTION: This exam was performed according to the departmental dose-optimization program which includes automated exposure control, adjustment of the mA and/or kV according to patient size and/or use of iterative reconstruction technique. CONTRAST:  OMNIPAQUE IOHEXOL 300 MG/ML  SOLN COMPARISON:  12/09/2022, 12/08/2022 FINDINGS: Lower chest: No acute pleural or parenchymal lung disease. Stable pericardial effusion. Stable distal esophageal wall thickening. Small hiatal hernia. Hepatobiliary: Marked distension of the gallbladder is again  noted, with intrahepatic duct dilation and proximal common bile duct distension, due to occlusion by the infiltrative pancreatic mass image previously. Heterogeneous appearance of the liver parenchyma within the inferior right lobe and extending along the gallbladder fossa could likely due to known portal vein thrombosis or geographic hepatic steatosis as seen on recent MRI. Multiple liver hypodensities are unchanged, previously characterized on MRI. Pancreas: The infiltrative mass centered at the pancreatic head is again noted, measuring up to 6.1 x 4.4 cm, compatible with pancreatic neoplasm based on MRI findings. Upstream pancreatic parenchymal atrophy. Decreased fluid collections surrounding the pancreas, now measuring 5.6 x 2.5 cm image 36/2 and 2.4 x 2.2 cm reference image 44/2. Spleen: Normal in size without focal abnormality. Adrenals/Urinary Tract: Stable appearance of the adrenals and kidneys. Bladder is decompressed, limiting its evaluation. Stomach/Bowel: Prominent gastric distension again noted, with functional gastric outlet obstruction due to mass effect by the infiltrative pancreatic mass described above. Likely ulceration within the gastric antrum measuring up to 1.8 cm reference image 26/5. No evidence of perforation. No evidence of bowel obstruction or ileus. Vascular/Lymphatic: Continued occlusion of the splenic vein, SMV, and proximal aspect of the portal vein again noted due to extrinsic compression by the infiltrative pancreatic mass. There is nonocclusive thrombus within the main portal vein near the bifurcation. Extensive venous collaterals are seen throughout the mesentery unchanged since prior exam. Stable aortic atherosclerosis. No discrete pathologic adenopathy identified. Numerous subcentimeter mesenteric lymph nodes adjacent to the pancreatic mass in the upper abdomen are nonspecific. Reproductive: Calcified uterine fibroids unchanged. No adnexal masses. Other: Free fluid surrounds the  distended gallbladder within the right upper quadrant. No free intraperitoneal gas. No abdominal wall hernia. Musculoskeletal: No acute or destructive bony abnormalities. Reconstructed images demonstrate no additional findings. IMPRESSION: 1. Stable infiltrating pancreatic mass, with thrombosis of the splenic vein, SMV, and proximal portal vein. Nonocclusive thrombus within the main portal vein just proximal to the bifurcation. 2. Decreased size of the peripancreatic fluid collections as above. New simple appearing free fluid surrounding a distended gallbladder and right upper quadrant could reflect interval rupture of these peripancreatic fluid collections. 3. Continued gastric distension compatible with functional gastric outlet obstruction due to the infiltrating pancreatic mass. Suspected ulcer within the gastric antrum, without evidence of perforation. 4. Marked gallbladder distension, with intrahepatic and extrahepatic biliary duct dilation, due to downstream common bile duct obstruction due to the pancreatic mass as seen on recent MRI. No evidence of gallbladder wall thickening or calcified gallstones. 5.  Aortic Atherosclerosis (ICD10-I70.0). Electronically Signed   By: Sharlet Salina M.D.   On: 12/12/2022 17:56   Addendum  I have seen the patient, examined her. I agree with the assessment and and plan and have edited the notes.   Is doing much better overall, she is eating well, and has been able to ambulate.  She is eager to go home.  She really improved after her duodenal stent placement and pancreatic cyst aspiration last week.  I will follow-up her in office after Thanksgiving, plan to let her try chemotherapy if she continues to improve.  All questions were answered.  I spent a total of 35 minutes for her visit today, more than 50% time on face-to-face counseling.  Malachy Mood MD 01/11/2023

## 2023-01-11 NOTE — Progress Notes (Signed)
PROGRESS NOTE    JEFFRIE ARONICA  VOZ:[id number] DOB: October 25, 1960 DOA: 01/03/2023 PCP: Bary Leriche, PA-C   Brief Narrative: This 62 year old female with previous alcohol induced pancreatitis, prior history of substance abuse, anxiety and depression, recent diagnosis of pancreatic adenocarcinoma and pancreatic pseudocyst during admission in November 2024 along with portal vein thrombosis along with cholecystostomy tube placement on 12/13/2022 with subsequent admission from 12/23/2022 to 12/27/2022 for partial gastric outlet obstruction and duodenal stenosis which was managed conservatively now presented with worsening nausea, vomiting and weakness on presentation, her sodium was 119. CT showed gastric outlet obstruction along with right lower lobe pneumonia. WBCs of 31. She was started on IV fluids and antibiotics. GI/oncology/palliative care were consulted.   Assessment & Plan:   Principal Problem:   Hyponatremia Active Problems:   Partial gastric outlet obstruction   Pancreatic adenocarcinoma (HCC)   SIRS (systemic inflammatory response syndrome) (HCC)   Protein-calorie malnutrition, severe   Pancreatic pseudocyst   Overlapping malignant neoplasm of pancreas (HCC)   Failure to thrive (child)   Gastric outlet obstruction   Gastric outlet obstruction: Most likely from pancreatic malignancy and pseudocysts. GI following: Status post EGD/EUS on 01/06/2023 which showed LA grade D erosive esophagitis along with duodenal stenosis in the duodenal bulb which was stented and a cystic lesion in the pancreas, fine-needle aspiration for fluid performed.  GI signed off on 01/07/2023 and recommended full liquid diet for 48 hours and if tolerating full liquid diet, diet could be advanced to low residue diet. Currently tolerating soft/low fiber diet.     Acute Kidney Injury: Likely from dehydration, poor oral intake, nausea and vomiting. Renal function has improved.  IV fluids discontinued on  01/09/2023.   Possible aspiration pneumonia Patient has received 7 days of IV Unasyn.  DC antibiotics.   Severe sepsis: Present on admission; resolved Lactic acidosis: Resolved Likely due to aspiration pneumonia.  Presented with tachycardia, tachypnea, leukocytosis, pneumonia, AKI and lactic acidosis Cultures negative so far.  Completed IV Unasyn for 7 days.   Leukocytosis Continue to monitor.   Thrombocytosis: Possibly reactive.  Monitor intermittently   Anemia of chronic disease From cancer and chemotherapy.   Recent diagnosis of pancreatic adenocarcinoma and pancreatic pseudocyst Portal vein thrombosis Goals of care discussion: -Oncology following.  Overall prognosis is guarded to poor.   -Palliative care following -Eliquis resumed from 01/08/2023 evening onwards as per GI recommendations    Hyponatremia: -Mild.  Monitor   Hypomagnesemia: Replaced, Improved.   Cholecystostomy previously placed on 12/13/2022 Outpatient follow-up with IR.  Currently draining and functioning.   History of prior alcohol abuse Bipolar disorder Continue Remeron and Seroquel.  Continue as needed Ativan   History of COPD Currently stable.   Cancer related cachexia Severe malnutrition/hypoalbuminemia -Consult nutrition.   DVT prophylaxis: Eliquis Code Status: DNR Family Communication: No family at bed side Disposition Plan:     Status is: Inpatient Remains inpatient appropriate because: Due to severity of illness.   Consultants:  Palliative care ID  Procedures:   Antimicrobials:  Anti-infectives (From admission, onward)    Start     Dose/Rate Route Frequency Ordered Stop   01/04/23 0000  Ampicillin-Sulbactam (UNASYN) 3 g in sodium chloride 0.9 % 100 mL IVPB  Status:  Discontinued        3 g 200 mL/hr over 30 Minutes Intravenous Every 6 hours 01/03/23 2135 01/10/23 0821   01/03/23 1815  cefTRIAXone (ROCEPHIN) 1 g in sodium chloride 0.9 % 100 mL IVPB  Status:  Discontinued         1 g 200 mL/hr over 30 Minutes Intravenous Every 24 hours 01/03/23 1806 01/03/23 2135   01/03/23 1815  azithromycin (ZITHROMAX) 500 mg in sodium chloride 0.9 % 250 mL IVPB  Status:  Discontinued        500 mg 250 mL/hr over 60 Minutes Intravenous Every 24 hours 01/03/23 1806 01/03/23 2135      Subjective: Patient was seen and examined at bedside.  Overnight events noted.   She reports feeling better but still reports feeling very weak and tired, She reports not ready to be discharged today.  Family has to do preparation before she is discharged home.  Objective: Vitals:   01/10/23 2133 01/11/23 0500 01/11/23 1106 01/11/23 1352  BP: 112/72 113/87  111/76  Pulse: 90 90  79  Resp: 14 16  18   Temp: 97.7 F (36.5 C) 97.7 F (36.5 C)  98.2 F (36.8 C)  TempSrc: Oral Oral  Oral  SpO2: 98% 98%  99%  Weight:   46.5 kg   Height:        Intake/Output Summary (Last 24 hours) at 01/11/2023 1402 Last data filed at 01/11/2023 1222 Gross per 24 hour  Intake 240 ml  Output 950 ml  Net -710 ml   Filed Weights   01/03/23 1628 01/11/23 1106  Weight: 45.4 kg 46.5 kg    Examination:  General exam: Appears calm and comfortable, severely deconditioned, not in any acute distress. Respiratory system: Clear to auscultation. Respiratory effort normal.  RR 15 Cardiovascular system: S1 & S2 heard, RRR. No JVD, murmurs, rubs, gallops or clicks. No pedal edema. Gastrointestinal system: Abdomen is non distended, soft and non tender. Normal bowel sounds heard. Central nervous system: Alert and oriented X 3. No focal neurological deficits. Extremities: No edema, no cyanosis, no clubbing. Skin: No rashes, lesions or ulcers Psychiatry: Judgement and insight appear normal. Mood & affect appropriate.     Data Reviewed: I have personally reviewed following labs and imaging studies  CBC: Recent Labs  Lab 01/06/23 0333 01/07/23 0410 01/08/23 0430 01/09/23 0221 01/11/23 0343  WBC 22.9*  13.3* 12.8* 16.2* 14.4*  NEUTROABS 16.2* 8.3* 6.4 8.6* 8.7*  HGB 8.1* 8.3* 8.8* 8.6* 8.3*  HCT 24.9* 25.7* 26.6* 25.9* 24.6*  MCV 100.4* 100.4* 98.5 97.7 96.5  PLT 435* 453* 538* 559* 536*   Basic Metabolic Panel: Recent Labs  Lab 01/06/23 0333 01/07/23 0410 01/08/23 0430 01/09/23 0221 01/11/23 0343  NA 131* 132* 133* 131* 130*  K 3.5 4.2 4.6 4.3 3.9  CL 95* 100 104 102 101  CO2 31 28 25 24 23   GLUCOSE 127* 116* 134* 145* 135*  BUN 15 7* <5* <5* 11  CREATININE 0.36* <0.30* 0.36* 0.36* 0.37*  CALCIUM 7.9* 7.7* 7.5* 7.6* 7.7*  MG 1.7 1.5* 2.1 1.8 2.0   GFR: Estimated Creatinine Clearance: 52.8 mL/min (A) (by C-G formula based on SCr of 0.37 mg/dL (L)). Liver Function Tests: Recent Labs  Lab 01/06/23 0333 01/07/23 0410 01/08/23 0430 01/09/23 0221 01/11/23 0343  AST 11* 10* 19 39 23  ALT 10 9 13 22 23   ALKPHOS 122 133* 379* 598* 505*  BILITOT 0.3 0.5 0.4 0.4 0.5  PROT 4.7* 4.5* 4.6* 4.6* 4.5*  ALBUMIN 2.0* 2.0* 2.0* 2.0* 2.0*   No results for input(s): "LIPASE", "AMYLASE" in the last 168 hours. No results for input(s): "AMMONIA" in the last 168 hours. Coagulation Profile: No results for input(s): "INR", "PROTIME" in the last  168 hours. Cardiac Enzymes: No results for input(s): "CKTOTAL", "CKMB", "CKMBINDEX", "TROPONINI" in the last 168 hours. BNP (last 3 results) No results for input(s): "PROBNP" in the last 8760 hours. HbA1C: No results for input(s): "HGBA1C" in the last 72 hours. CBG: No results for input(s): "GLUCAP" in the last 168 hours. Lipid Profile: No results for input(s): "CHOL", "HDL", "LDLCALC", "TRIG", "CHOLHDL", "LDLDIRECT" in the last 72 hours. Thyroid Function Tests: No results for input(s): "TSH", "T4TOTAL", "FREET4", "T3FREE", "THYROIDAB" in the last 72 hours. Anemia Panel: No results for input(s): "VITAMINB12", "FOLATE", "FERRITIN", "TIBC", "IRON", "RETICCTPCT" in the last 72 hours. Sepsis Labs: No results for input(s): "PROCALCITON",  "LATICACIDVEN" in the last 168 hours.  Recent Results (from the past 240 hours)  Blood culture (routine x 2)     Status: None   Collection Time: 01/03/23  6:33 PM   Specimen: BLOOD  Result Value Ref Range Status   Specimen Description   Final    BLOOD PORTA CATH Performed at [facility], [address] [place], [place] [address]    Special Requests   Final    BOTTLES DRAWN AEROBIC AND ANAEROBIC Blood Culture adequate volume Performed at [facility], [address], [place], [place] [address]    Culture   Final    NO GROWTH 5 DAYS Performed at [facility], [address] [place], [place] [address]    Report Status 01/08/2023 FINAL  Final  Blood culture (routine x 2)     Status: None   Collection Time: 01/03/23  6:38 PM   Specimen: BLOOD  Result Value Ref Range Status   Specimen Description   Final    BLOOD LEFT ANTECUBITAL Performed at [facility], [address], [place], [place] [address]    Special Requests   Final    BOTTLES DRAWN AEROBIC AND ANAEROBIC Blood Culture adequate volume Performed at [facility], [address] [place], [place] [address]    Culture   Final    NO GROWTH 5 DAYS Performed at [facility], [address] [place], [place] [address]    Report Status 01/08/2023 FINAL  Final  Aerobic/Anaerobic Culture w Gram Stain (surgical/deep wound)     Status: None (Preliminary result)   Collection Time: 01/06/23  2:49 PM   Specimen: PATH GI biopsy; Tissue  Result Value Ref Range Status   Specimen Description   Final    CYSTS Performed at [facility], [address], [place], [place] [address]    Special Requests   Final    NONE Performed at [facility], [address], [place], [place] [address]    Gram Stain   Final    NO WBC SEEN NO ORGANISMS SEEN Performed at [facility], [address], [place], [place] [address]    Culture   Final    RARE  CANDIDA ALBICANS NO ANAEROBES ISOLATED; CULTURE IN PROGRESS FOR 5 DAYS    Report Status PENDING  Incomplete    Radiology Studies: No results found.  Scheduled Meds:  apixaban  5 mg Oral BID   Chlorhexidine Gluconate Cloth  6 each Topical Daily   feeding supplement  237 mL Oral TID BM   gabapentin  200 mg Oral QHS   mirtazapine  45 mg Oral QHS   multivitamin with minerals  1 tablet Oral Daily   pantoprazole (PROTONIX) IV  40 mg Intravenous Q12H   QUEtiapine  200 mg Oral QHS   sucralfate  1 g Oral TID WC & HS   thiamine (VITAMIN B1) injection  100 mg Intravenous Daily   Vitamin D (Ergocalciferol)  50,000 Units Oral Q7 days   Continuous Infusions:   LOS: 8 days    Time spent: 50 mins    Willeen Niece, MD [facility]   If 7PM-7AM, please contact night-coverage

## 2023-01-12 LAB — CBC
HCT: 24.3 % — ABNORMAL LOW (ref 36.0–46.0)
Hemoglobin: 8.2 g/dL — ABNORMAL LOW (ref 12.0–15.0)
MCH: 32.3 pg (ref 26.0–34.0)
MCHC: 33.7 g/dL (ref 30.0–36.0)
MCV: 95.7 fL (ref 80.0–100.0)
Platelets: 574 10*3/uL — ABNORMAL HIGH (ref 150–400)
RBC: 2.54 MIL/uL — ABNORMAL LOW (ref 3.87–5.11)
RDW: 14.4 % (ref 11.5–15.5)
WBC: 13.5 10*3/uL — ABNORMAL HIGH (ref 4.0–10.5)
nRBC: 0 % (ref 0.0–0.2)

## 2023-01-12 LAB — PHOSPHORUS: Phosphorus: 3.6 mg/dL (ref 2.5–4.6)

## 2023-01-12 LAB — BASIC METABOLIC PANEL
Anion gap: 5 (ref 5–15)
BUN: 13 mg/dL (ref 8–23)
CO2: 27 mmol/L (ref 22–32)
Calcium: 8 mg/dL — ABNORMAL LOW (ref 8.9–10.3)
Chloride: 101 mmol/L (ref 98–111)
Creatinine, Ser: 0.41 mg/dL — ABNORMAL LOW (ref 0.44–1.00)
GFR, Estimated: >60 mL/min (ref >60)
Glucose, Bld: 121 mg/dL — ABNORMAL HIGH (ref 70–99)
Potassium: 4.3 mmol/L (ref 3.5–5.1)
Sodium: 133 mmol/L — ABNORMAL LOW (ref 135–145)

## 2023-01-12 LAB — MAGNESIUM: Magnesium: 2 mg/dL (ref 1.7–2.4)

## 2023-01-12 MED ORDER — HEPARIN SOD (PORK) LOCK FLUSH 100 UNIT/ML IV SOLN
500.0000 [IU] | INTRAVENOUS | Status: AC | PRN
  Administered 2023-01-12: 500 [IU]
  Filled 2023-01-12: qty 5

## 2023-01-12 MED ORDER — APIXABAN 5 MG PO TABS: 5.0000 mg | ORAL_TABLET | Freq: Two times a day (BID) | ORAL | 0 refills | Status: DC

## 2023-01-12 NOTE — Plan of Care (Signed)
  Problem: Activity: Goal: Risk for activity intolerance will decrease Outcome: Progressing   Problem: Pain Management: Goal: General experience of comfort will improve Outcome: Progressing   Problem: Safety: Goal: Ability to remain free from injury will improve Outcome: Progressing   Problem: Skin Integrity: Goal: Risk for impaired skin integrity will decrease Outcome: Progressing

## 2023-01-12 NOTE — Progress Notes (Signed)
OT Cancellation Note  Patient Details Name: Kathryn Underwood MRN: [id number] DOB: 1960-11-07   Cancelled Treatment:    Reason Eval/Treat Not Completed: Other (comment);Patient declined, no reason specified. Attempted 2x to see pt. First attempt pt just finishing lunch, second attempt pt getting dressed for discharge and declining assist from this author.   Sueko Dimichele L. Mykal Kirchman, OTR/L  01/12/23, 2:45 PM

## 2023-01-12 NOTE — Discharge Summary (Signed)
Physician Discharge Summary  Kathryn Underwood ZOX:[id number] DOB: 02-02-1960 DOA: 01/03/2023  PCP: Bary Leriche, PA-C  Admit date: 01/03/2023  Discharge date: 01/12/2023  Admitted From: Home.  Disposition:  Home Health Services.  Recommendations for Outpatient Follow-up:  Follow up with PCP in 1-2 weeks Please obtain BMP/CBC in one week   Home Health:Home PT/OT Equipment/Devices:None  Discharge Condition: Stable CODE STATUS: DNR Diet recommendation: Heart Healthy  Brief Summary / Hospital course : This 62 year old female with previous alcohol induced pancreatitis, prior history of substance abuse, anxiety and depression, recent diagnosis of pancreatic adenocarcinoma and pancreatic pseudocyst during admission in November 2024 along with portal vein thrombosis along with cholecystostomy tube placement on 12/13/2022 with subsequent admission from 12/23/2022 to 12/27/2022 for partial gastric outlet obstruction and duodenal stenosis which was managed conservatively now presented with worsening nausea, vomiting and weakness on presentation, her sodium was 119. CT showed gastric outlet obstruction along with right lower lobe pneumonia. WBCs of 31. She was started on IV fluids and antibiotics. GI/oncology/palliative care were consulted.  Patient is status post EGD/EUS on 01/06/2023 which showed LA grade D erosive esophagitis along with duodenal stenosis which was stented and a cystic lesion in the pancreatic fine-needle aspiration was performed.  GI signed off.  Patient started on clear liquid diet tolerated well advance to low residue diet tolerating well.  Renal functions improved.  Patient completed antibiotics for 7 days for sepsis possible aspiration pneumonia.  Patient resumed on Eliquis as per GI recommendation given history of portal vein thrombosis.  Patient feels better wants to be discharged.  Home health services arranged.  Patient being discharged home.   Discharge Diagnoses:   Principal Problem:   Hyponatremia Active Problems:   Partial gastric outlet obstruction   Pancreatic adenocarcinoma (HCC)   SIRS (systemic inflammatory response syndrome) (HCC)   Protein-calorie malnutrition, severe   Pancreatic pseudocyst   Overlapping malignant neoplasm of pancreas (HCC)   Failure to thrive (child)   Gastric outlet obstruction  Gastric outlet obstruction: Most likely from pancreatic malignancy and pseudocysts. GI following: Status post EGD/EUS on 01/06/2023 which showed LA grade D erosive esophagitis along with duodenal stenosis in the duodenal bulb which was stented and a cystic lesion in the pancreas, fine-needle aspiration for fluid performed.  GI signed off on 01/07/2023 and recommended full liquid diet for 48 hours and if tolerating full liquid diet, diet could be advanced to low residue diet. Currently tolerating soft/low fiber diet.     Acute Kidney Injury: Likely from dehydration, poor oral intake, nausea and vomiting. Renal function has improved.  IV fluids discontinued on 01/09/2023.   Possible aspiration pneumonia: Patient has received 7 days of IV Unasyn.  DC antibiotics.   Severe sepsis: Present on admission; resolved. Lactic acidosis: Resolved Likely due to aspiration pneumonia.  Presented with tachycardia, tachypnea, leukocytosis, pneumonia, AKI and lactic acidosis Cultures negative so far.  Completed IV Unasyn for 7 days.   Leukocytosis Continue to monitor.   Thrombocytosis: Possibly reactive.  Monitor intermittently   Anemia of chronic disease From cancer and chemotherapy.   Recent diagnosis of pancreatic adenocarcinoma and pancreatic pseudocyst Portal vein thrombosis Goals of care discussion: -Oncology following.  Overall prognosis is guarded to poor.   -Palliative care following -Eliquis resumed from 01/08/2023 evening onwards as per GI recommendations    Hyponatremia: -Mild.  Monitor   Hypomagnesemia: Replaced, Improved.    Cholecystostomy previously placed on 12/13/2022 Outpatient follow-up with IR.  Currently draining and functioning.   History of  prior alcohol abuse Bipolar disorder Continue Remeron and Seroquel.  Continue as needed Ativan   History of COPD Currently stable.   Cancer related cachexia Severe malnutrition/hypoalbuminemia -Consult nutrition.   Discharge Instructions  Discharge Instructions     Call MD for:  difficulty breathing, headache or visual disturbances   Complete by: As directed    Call MD for:  persistant dizziness or light-headedness   Complete by: As directed    Call MD for:  persistant nausea and vomiting   Complete by: As directed    Diet - low sodium heart healthy   Complete by: As directed    Diet Carb Modified   Complete by: As directed    Discharge instructions   Complete by: As directed    Advised to follow-up with primary care physician in 1 week. Advised to continue current medications as prescribed   Increase activity slowly   Complete by: As directed    No wound care   Complete by: As directed       Allergies as of 01/12/2023   No Known Allergies      Medication List     TAKE these medications    acetaminophen 500 MG tablet Commonly known as: TYLENOL Take 500-1,000 mg by mouth every 6 (six) hours as needed for moderate pain (pain score 4-6).   apixaban 5 MG Tabs tablet Commonly known as: ELIQUIS Take 1 tablet (5 mg total) by mouth 2 (two) times daily. What changed:  See the new instructions. Another medication with the same name was removed. Continue taking this medication, and follow the directions you see here.   Ensure Max Protein Liqd Take 330 mLs (11 oz total) by mouth 2 (two) times daily.   gabapentin 300 MG capsule Commonly known as: NEURONTIN Take 2 capsules (600 mg total) by mouth 2 (two) times daily. What changed:  how much to take when to take this   lidocaine-prilocaine cream Commonly known as: EMLA Apply to  affected area once   mirtazapine 45 MG tablet Commonly known as: REMERON Take 45 mg by mouth at bedtime.   ondansetron 8 MG tablet Commonly known as: Zofran Take 1 tablet (8 mg total) by mouth every 8 (eight) hours as needed for nausea or vomiting.   oxyCODONE 5 MG immediate release tablet Commonly known as: Oxy IR/ROXICODONE Take 1 tablet (5 mg total) by mouth every 4 (four) hours as needed for moderate pain (pain score 4-6).   pantoprazole 40 MG tablet Commonly known as: PROTONIX Take 1 tablet (40 mg total) by mouth 2 (two) times daily.   prochlorperazine 10 MG tablet Commonly known as: COMPAZINE Take 1 tablet (10 mg total) by mouth every 6 (six) hours as needed for nausea or vomiting.   QUEtiapine 100 MG tablet Commonly known as: SEROQUEL Take 200 mg by mouth at bedtime.   sodium chloride flush 0.9 % Soln Commonly known as: NS Use 5 - 10 mLs to flush abdominal drain once daily as directed   sucralfate 1 GM/10ML suspension Commonly known as: Carafate Take 10 mLs (1 g total) by mouth 4 (four) times daily -  with meals and at bedtime.   Vitamin D (Ergocalciferol) 1.25 MG (50000 UNIT) Caps capsule Commonly known as: DRISDOL Take 1 capsule (50,000 Units total) by mouth every 7 (seven) days.         Follow-up Information     [facility] Shirlee [facility]ick (Formerly Follow up.   Why: [facility] is your home health company  for phsyical therapy, will follow up 24hrs to 48hrs after discharge. Contact information: [address], [address] [place] [address] [phone]         Allwardt, Crist Infante, PA-C Follow up in 1 week(s).   Specialty: Physician Assistant Contact information: [address] [place] [address] [phone]                No Known Allergies  Consultations: Oncology Gastroenterology IR    Procedures/Studies: DG ESOPHAGUS DILATION Result Date: 01/06/2023 ESOPHAGEAL DILATATION: Fluoroscopy was provided for use by the  requesting physician.  No images were obtained for radiographic interpretation.  DG C-Arm 1-60 Min-No Report Result Date: 01/06/2023 Fluoroscopy was utilized by the requesting physician.  No radiographic interpretation.   DG Abd Portable 1V Result Date: 01/04/2023 CLINICAL DATA:  Nasogastric tube placement. EXAM: PORTABLE ABDOMEN - 1 VIEW COMPARISON:  Portable chest dated 01/03/2023 and abdomen and pelvis CT dated 01/03/2023. FINDINGS: Nasogastric tube side hole just distal to the gastroesophageal junction and tip in the proximal to mid stomach. With left jugular catheter tip at the superior cavoatrial junction. Resolved left basilar atelectasis. The included bowel-gas pattern is unremarkable. IMPRESSION: Nasogastric tube tip in the proximal to mid stomach. Electronically Signed   By: Beckie Salts M.D.   On: 01/04/2023 17:29   CT ABDOMEN PELVIS WO CONTRAST Result Date: 01/03/2023 CLINICAL DATA:  Abdominal pain. Nausea vomiting for several days. Reported history of pancreatic carcinoma. EXAM: CT ABDOMEN AND PELVIS WITHOUT CONTRAST TECHNIQUE: Multidetector CT imaging of the abdomen and pelvis was performed following the standard protocol without IV contrast. RADIATION DOSE REDUCTION: This exam was performed according to the departmental dose-optimization program which includes automated exposure control, adjustment of the mA and/or kV according to patient size and/or use of iterative reconstruction technique. COMPARISON:  12/23/2022. FINDINGS: Lower chest: Ill-defined peribronchovascular ground-glass and more confluent opacities, most evident in the left lower lobe, new since the prior CT. No pleural effusion. Small pericardial effusion similar to the prior CT. Fluid mildly distends the distal esophagus. Hepatobiliary: Ill-defined hypoattenuation involving segments 4 and 5 as well as portions of segment 6, better defined on the prior contrast enhanced CT, consistent with metastatic disease. There also  several more well-defined hypoattenuating liver masses there are stable. No new liver abnormalities. Pigtail drainage catheter lies along the inferior margin of the right lobe, unchanged. Pancreas: Heterogeneous mass with ill-defined margins arises from the pancreatic head with coarse central calcifications. This is unchanged from the prior CT. Fluid collection abuts the right inferior pancreatic head and adjacent duodenum measuring 3.7 cm. Larger fluid collection abuts the proximal pancreatic tail, posterior to the stomach, 5.8 cm in size. These are similar to the prior CT suspected to be pseudocysts. There is some fat stranding adjacent to the pancreas, similar to the prior CT. Spleen: Normal. Adrenals/Urinary Tract: Mild bilateral adrenal gland thickening consistent with hyperplasia. Normal kidneys, ureters and bladder. Stomach/Bowel: Significant gastric distension. No stomach wall thickening. Duodenum broadly abuts the pancreatic head mass. Wall not defined separate from the mass on this unenhanced exam. Small bowel and colon are normal in caliber. No wall thickening or inflammation. Mild to moderate increase in the colonic stool burden similar to the prior CT. Vascular/Lymphatic: Aortic atherosclerotic calcifications. No defined enlarged lymph nodes. Reproductive: Partly calcified uterine fibroids.  No adnexal masses. Other: No ascites. Musculoskeletal: No fracture or acute finding.  No bone lesion. IMPRESSION: 1. Significant stomach distention. This is suspected to be due to partial gastric outlet  obstruction at the level of the duodenum due to the large pancreatic mass. 2. Pancreatic mass, liver lesions and right upper quadrant drainage catheter are without significant change from the recent prior CT, better defined on the prior study due to the presence of intravenous contrast. 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 These are also unchanged from the recent prior  CT. Electronically Signed   By: Amie Portland M.D.   On: 01/03/2023 19:09   DG Chest Portable 1 View Result Date: 01/03/2023 CLINICAL DATA:  Shortness of breath EXAM: PORTABLE CHEST 1 VIEW COMPARISON:  CT chest dated 12/17/2022 FINDINGS: Mild left lower lobe opacity, atelectasis versus pneumonia. Right lung is clear. No pleural effusion or pneumothorax. The heart is normal in size. Left chest power port terminates in the upper right atrium. IMPRESSION: Mild left lower lobe opacity, atelectasis versus pneumonia. Electronically Signed   By: Charline Bills M.D.   On: 01/03/2023 17:28   CT ABDOMEN PELVIS W CONTRAST Result Date: 12/23/2022 CLINICAL DATA:  Status post recent drain placement pancreas cancer continued pain EXAM: CT ABDOMEN AND PELVIS WITH CONTRAST TECHNIQUE: Multidetector CT imaging of the abdomen and pelvis was performed using the standard protocol following bolus administration of intravenous contrast. RADIATION DOSE REDUCTION: This exam was performed according to the departmental dose-optimization program which includes automated exposure control, adjustment of the mA and/or kV according to patient size and/or use of iterative reconstruction technique. CONTRAST:  85mL OMNIPAQUE IOHEXOL 300 MG/ML  SOLN COMPARISON:  CT 12/12/2022, MRI 12/09/2022, CT 12/08/2022, 07/24/2021 FINDINGS: Lower chest: Lung bases demonstrate no acute airspace disease. Similar small volume pericardial effusion. Small hiatal hernia and mild distal esophageal thickening. Small distal esophageal varices. Hepatobiliary: Multiple hypodense liver lesions, reference prior MRI for further description and characterisation. Interim placement of percutaneous cholecystostomy tube with decompression of the gallbladder. Heterogeneous enhancement pattern of the liver which may be due to combination of steatosis and perfusion abnormality. Central intra hepatic ducts and extrahepatic biliary dilatation, common duct measures up to 15 mm.  Abrupt ductal cut off on coronal views due to pancreatic mass lesion. Pancreas: Poorly defined infiltrative mass involving the pancreatic head and neck, measures roughly 6 by 4.8 cm on series 2, image 28. Distal pancreatic atrophy with ductal dilatation. Soft tissue stranding at the pancreatic bed consistent with inflammation. Increased size of fluid collection between pancreas and stomach, now measuring 6.5 by 4.7 cm, previously 5.6 by 2.5 cm. Spleen: Normal in size without focal abnormality. Adrenals/Urinary Tract: Stable adrenal glands with diffuse thickening but no dominant mass. Kidneys show no hydronephrosis. The bladder is normal Stomach/Bowel: Moderate fluid distension of the stomach. Wall thickening of the pylorus and duodenal C sweep with inflammation. Focal wall thickening and area of fluid near duodenal bulb, coronal series 5, image 62, potentially due to an area of ulceration. No free gas in the street gin. Remainder of the bowel shows no evidence for obstruction or wall thickening Vascular/Lymphatic: Advanced aortic atherosclerosis. No aneurysm. Narrowing of superior mesenteric artery by infiltrative pancreas mass. Occluded appearing splenic and superior mesenteric veins. 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 Numerous collateral vessels throughout the mesentery. Reproductive: Calcified uterine fibroids.  No adnexal mass Other: No free air. No large volume pelvic ascites. Increased organized fluid collection between the head of pancreas and the second portion of duodenum, this now measures 4 x 2.6 cm, previously 2.4 x 2.2 cm. Musculoskeletal: No acute osseous abnormality. IMPRESSION: 1. Interim placement of percutaneous cholecystostomy tube with decompression of the  gallbladder. 2. Mixed findings of pancreatic mass and inflammatory change. Poorly defined infiltrative mass involving the pancreatic head and neck consistent with known pancreatic adenocarcinoma. Persistent stranding at the pancreatic bed  consistent with pancreatic inflammatory process. Increased size of peripancreatic fluid collections compared to the most recent prior CT. 3. Distended stomach with wall thickening of the antrum and pylorus suspicious for inflammation. Possible small duodenal ulcer but no perforation. Wall thickening and inflammation of the duodenal C sweep. 4. Multiple hypodense liver lesions, reference prior MRI for further description and characterization. 5. Occluded appearing splenic and superior mesenteric veins. 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 Numerous collateral vessels throughout the mesentery. These findings are present on the prior exam 6. Aortic atherosclerosis. Aortic Atherosclerosis (ICD10-I70.0). Electronically Signed   By: Jasmine Pang M.D.   On: 12/23/2022 18:51   IR IMAGING GUIDED PORT INSERTION Result Date: 12/18/2022 INDICATION: 62 year old with pancreatic cancer. Port-A-Cath needed for treatment. EXAM: FLUOROSCOPIC AND ULTRASOUND GUIDED PLACEMENT OF A SUBCUTANEOUS PORT. Physician: Rachelle Hora. Henn, MD MEDICATIONS: Moderate sedation ANESTHESIA/SEDATION: Moderate (conscious) sedation was employed during this procedure. A total of Versed 1mg  and fentanyl 50 mcg was administered intravenously at the order of the provider performing the procedure. Total intra-service moderate sedation time: 57 minutes. Patient's level of consciousness and vital signs were monitored continuously by radiology nurse throughout the procedure under the supervision of the provider performing the procedure. FLUOROSCOPY: Radiation Exposure Index (as provided by the fluoroscopic device): 3 mGy Kerma COMPLICATIONS: None immediate. PROCEDURE: The risks of the procedure were explained to the patient. Informed consent was obtained. Patient was placed supine on the interventional table. Ultrasound confirmed a patent left internal jugular vein. The left chest and neck were cleaned with a skin antiseptic and a sterile drape was placed. Maximal  barrier sterile technique was utilized including caps, mask, sterile gowns, sterile gloves, sterile drape, hand hygiene and skin antiseptic. The left neck was anesthetized with 1% lidocaine. Small incision was made in the left neck with a blade. Micropuncture set was placed in the left IJ with ultrasound guidance. The micropuncture wire was used for measurement purposes. The left chest was anesthetized with 1% lidocaine with epinephrine. #15 blade was used to make an incision and a subcutaneous port pocket was formed. 8 french Power Port was assembled. Subcutaneous tunnel was formed with a stiff tunneling device. The port catheter was brought through the subcutaneous tunnel. The port was placed in the subcutaneous pocket. The micropuncture set was exchanged for a peel-away sheath. The catheter was placed through the peel-away sheath but the tip was too short in the upper SVC. Attempted to place a longer catheter over a wire but this was unsuccessful. As a result, the access was completely removed. Left jugular vein was punctured again using ultrasound guidance and micropuncture dilator set was placed. A new port catheter was tunneled between the subcutaneous pocket and the vein site. A new peel-away sheath was placed and the port catheter was advanced into the central venous system. Catheter tip was placed at the superior cavoatrial junction. The catheter was cut and attached to the port. Port was placed within the subcutaneous pocket. Catheter placement was confirmed with fluoroscopy. The port was accessed and flushed with saline. The port pocket was closed using two layers of absorbable sutures and Dermabond. The vein skin site was closed using a single layer of absorbable suture and Dermabond. Sterile dressings were applied. Patient tolerated the procedure well without an immediate complication. Ultrasound and fluoroscopic images were taken and  saved for this procedure. IMPRESSION: Placement of a subcutaneous  port device. Catheter tip at the superior cavoatrial junction and ready to be used. Electronically Signed   By: Richarda Overlie M.D.   On: 12/18/2022 14:03   CT CHEST WO CONTRAST Result Date: 12/17/2022 CLINICAL DATA:  Staging for recently diagnosed pancreatic cancer. EXAM: CT CHEST WITHOUT CONTRAST TECHNIQUE: Multidetector CT imaging of the chest was performed following the standard protocol without IV contrast. RADIATION DOSE REDUCTION: This exam was performed according to the departmental dose-optimization program which includes automated exposure control, adjustment of the mA and/or kV according to patient size and/or use of iterative reconstruction technique. COMPARISON:  Chest, abdomen and pelvis CT dated 12/08/2022 and abdomen and pelvis CT dated 12/12/2022. Abdomen MRI dated 12/09/2022. Abdomen and pelvis CT dated 07/27/2021. Chest CT dated 07/24/2021. FINDINGS: Cardiovascular: Atheromatous calcifications, including the coronary arteries and aorta. Normal sized heart. Small pericardial effusion with an interval decrease in size with a current maximum thickness of 1.1 cm. Mediastinum/Nodes: No enlarged mediastinal or axillary lymph nodes. Thyroid gland and trachea are unremarkable. Previously demonstrated diffuse low-density wall thickening involving the distal esophagus. Lungs/Pleura: Stable small area of ground-glass opacity in the medial left upper lobe. This remains less prominent than seen initially on 07/24/2021 suggesting an infectious or inflammatory process. Mild bilateral linear atelectasis and scarring. No solid lung nodules or pleural fluid. Upper Abdomen: The previously described large pancreatic head mass is partially included and unchanged. Again demonstrated is somewhat patchy, confluent low density in the liver on the right felt to represent a flow anomaly on previous studies. The previously demonstrated portal vein thrombus is not visible without intravenous contrast today. Multiple small  liver cysts are unchanged. Musculoskeletal: Lower cervical spine degenerative changes and minimal thoracic spine degenerative changes. No evidence of bony metastatic disease. IMPRESSION: 1. No evidence of metastatic disease in the chest. 2. Stable small area of ground-glass opacity in the medial left upper lobe, likely an infectious or inflammatory process. 3. Small pericardial effusion with an interval decrease in size. 4. Previously demonstrated diffuse low-density wall thickening involving the distal esophagus. This most likely due to reflux esophagitis. 5. Calcific coronary artery and aortic atherosclerosis. 6. Stable recently described pancreatic head mass and probable perfusion abnormality in the liver. Without the previous studies for comparison, metastatic disease/direct spread of tumor in the liver could have a similar appearance. Aortic Atherosclerosis (ICD10-I70.0). Electronically Signed   By: Beckie Salts M.D.   On: 12/17/2022 11:43   IR Perc Cholecystostomy Addendum Date: 12/13/2022 ADDENDUM REPORT: 12/13/2022 20:18 ADDENDUM: Contrast: 5 mL Omnipaque 300 Electronically Signed   By: Richarda Overlie M.D.   On: 12/13/2022 20:18   Result Date: 12/13/2022 INDICATION: 62 year old with abdominal pain and severe gallbladder distension. New fluid around the gallbladder after upper endoscopy and EUS. Concern for a pancreatic malignancy. EXAM: IMAGE GUIDED PERCUTANEOUS CHOLECYSTOSTOMY TUBE PLACEMENT MEDICATIONS: Inpatient and receiving IV antibiotics. ANESTHESIA/SEDATION: Moderate (conscious) sedation was employed during this procedure. A total of Versed 1.5 mg and Fentanyl 50 mcg was administered intravenously by the radiology nurse. Total intra-service moderate Sedation Time: 15 minutes. The patient's level of consciousness and vital signs were monitored continuously by radiology nursing throughout the procedure under my direct supervision. FLUOROSCOPY: Radiation Exposure Index (as provided by the fluoroscopic  device): 0.1 mGy Kerma COMPLICATIONS: None immediate. PROCEDURE: Informed written consent was obtained from the patient after a thorough discussion of the procedural risks, benefits and alternatives. All questions were addressed. A timeout was performed prior to  the initiation of the procedure. Patient was placed supine. The right side of the abdomen was prepped and draped in sterile fashion. Maximal barrier sterile technique was utilized including caps, mask, sterile gowns, sterile gloves, sterile drape, hand hygiene and skin antiseptic. Ultrasound demonstrated a very distended gallbladder. The skin was anesthetized using 1% lidocaine. Using ultrasound guidance, 21 gauge needle was directed into the gallbladder and a 0.018 wire was placed. A transitional dilator set was placed. Wire was advanced into the gallbladder and the tract was dilated to accommodate a 10 [place] multipurpose drain. Approximately 200 mL of dark green fluid was removed. The gallbladder was decompressed at the end of the procedure. Contrast injection confirmed placement in the gallbladder. Drain was sutured to skin and attached to a gravity bag. Fluid was sent for culture and cytology. Fluoroscopic and ultrasound images were taken and saved for documentation. FINDINGS: Gallbladder was severely distended. Fluid and edematous tissue around the gallbladder. Gallbladder was decompressed at the end of the procedure. 200 mL of fluid was removed. IMPRESSION: Successful image guided placement of a percutaneous cholecystostomy tube. Electronically Signed: By: Richarda Overlie M.D. On: 12/13/2022 19:47    Subjective: Seen and examined at bedside.  Overnight events noted.  Patient reports doing much better,  wants to be discharged.  Discharge Exam: Vitals:   01/12/23 0449 01/12/23 1356  BP: 101/68 110/66  Pulse: (!) 106 (!) 105  Resp: 18 18  Temp: 98.4 F (36.9 C) 98.2 F (36.8 C)  SpO2: 97% 96%   Vitals:   01/11/23 1352 01/11/23 1952 01/12/23  0449 01/12/23 1356  BP: 111/76 115/72 101/68 110/66  Pulse: 79 95 (!) 106 (!) 105  Resp: 18 18 18 18   Temp: 98.2 F (36.8 C) 98.1 F (36.7 C) 98.4 F (36.9 C) 98.2 F (36.8 C)  TempSrc: Oral Oral Oral Oral  SpO2: 99% 99% 97% 96%  Weight:      Height:        General: Pt is alert, awake, not in acute distress Cardiovascular: RRR, S1/S2 +, no rubs, no gallops Respiratory: CTA bilaterally, no wheezing, no rhonchi Abdominal: Soft, NT, ND, bowel sounds + Extremities: no edema, no cyanosis    The results of significant diagnostics from this hospitalization (including imaging, microbiology, ancillary and laboratory) are listed below for reference.     Microbiology: Recent Results (from the past 240 hours)  Blood culture (routine x 2)     Status: None   Collection Time: 01/03/23  6:33 PM   Specimen: BLOOD  Result Value Ref Range Status   Specimen Description   Final    BLOOD PORTA CATH Performed at [facility], [address] [place], [place] [address]    Special Requests   Final    BOTTLES DRAWN AEROBIC AND ANAEROBIC Blood Culture adequate volume Performed at [facility], [address] [place], [place] [address]    Culture   Final    NO GROWTH 5 DAYS Performed at [facility], [address], [place], [place] [address]    Report Status 01/08/2023 FINAL  Final  Blood culture (routine x 2)     Status: None   Collection Time: 01/03/23  6:38 PM   Specimen: BLOOD  Result Value Ref Range Status   Specimen Description   Final    BLOOD LEFT ANTECUBITAL Performed at [facility], [address] [place], [place] [address]    Special Requests   Final  BOTTLES DRAWN AEROBIC AND ANAEROBIC Blood Culture adequate volume Performed at [facility], [address], [place], [place] [address]    Culture   Final    NO GROWTH 5 DAYS Performed at [facility], [address] [place], [place] [address]    Report  Status 01/08/2023 FINAL  Final  Aerobic/Anaerobic Culture w Gram Stain (surgical/deep wound)     Status: None   Collection Time: 01/06/23  2:49 PM   Specimen: PATH GI biopsy; Tissue  Result Value Ref Range Status   Specimen Description   Final    CYSTS Performed at [facility], [address] [place], [place] [address]    Special Requests   Final    NONE Performed at [facility], [address], [place], [place] [address]    Gram Stain NO WBC SEEN NO ORGANISMS SEEN   Final   Culture   Final    RARE CANDIDA ALBICANS NO ANAEROBES ISOLATED Performed at [facility], [address], [place], [place] [address]    Report Status 01/11/2023 FINAL  Final     Labs: BNP (last 3 results) No results for input(s): "BNP" in the last 8760 hours. Basic Metabolic Panel: Recent Labs  Lab 01/07/23 0410 01/08/23 0430 01/09/23 0221 01/11/23 0343 01/12/23 0334  NA 132* 133* 131* 130* 133*  K 4.2 4.6 4.3 3.9 4.3  CL 100 104 102 101 101  CO2 28 25 24 23 27   GLUCOSE 116* 134* 145* 135* 121*  BUN 7* <5* <5* 11 13  CREATININE <0.30* 0.36* 0.36* 0.37* 0.41*  CALCIUM 7.7* 7.5* 7.6* 7.7* 8.0*  MG 1.5* 2.1 1.8 2.0 2.0  PHOS  --   --   --   --  3.6   Liver Function Tests: Recent Labs  Lab 01/06/23 0333 01/07/23 0410 01/08/23 0430 01/09/23 0221 01/11/23 0343  AST 11* 10* 19 39 23  ALT 10 9 13 22 23   ALKPHOS 122 133* 379* 598* 505*  BILITOT 0.3 0.5 0.4 0.4 0.5  PROT 4.7* 4.5* 4.6* 4.6* 4.5*  ALBUMIN 2.0* 2.0* 2.0* 2.0* 2.0*   No results for input(s): "LIPASE", "AMYLASE" in the last 168 hours. No results for input(s): "AMMONIA" in the last 168 hours. CBC: Recent Labs  Lab 01/06/23 0333 01/07/23 0410 01/08/23 0430 01/09/23 0221 01/11/23 0343 01/12/23 0334  WBC 22.9* 13.3* 12.8* 16.2* 14.4* 13.5*  NEUTROABS 16.2* 8.3* 6.4 8.6* 8.7*  --   HGB 8.1* 8.3* 8.8* 8.6* 8.3* 8.2*  HCT 24.9* 25.7* 26.6* 25.9* 24.6* 24.3*  MCV 100.4* 100.4* 98.5 97.7 96.5  95.7  PLT 435* 453* 538* 559* 536* 574*   Cardiac Enzymes: No results for input(s): "CKTOTAL", "CKMB", "CKMBINDEX", "TROPONINI" in the last 168 hours. BNP: Invalid input(s): "POCBNP" CBG: No results for input(s): "GLUCAP" in the last 168 hours. D-Dimer No results for input(s): "DDIMER" in the last 72 hours. Hgb A1c No results for input(s): "HGBA1C" in the last 72 hours. Lipid Profile No results for input(s): "CHOL", "HDL", "LDLCALC", "TRIG", "CHOLHDL", "LDLDIRECT" in the last 72 hours. Thyroid function studies No results for input(s): "TSH", "T4TOTAL", "T3FREE", "THYROIDAB" in the last 72 hours.  Invalid input(s): "FREET3" Anemia work up No results for input(s): "VITAMINB12", "FOLATE", "FERRITIN", "TIBC", "IRON", "RETICCTPCT" in the last 72 hours. Urinalysis    Component Value Date/Time   COLORURINE YELLOW 01/03/2023 2133   APPEARANCEUR CLEAR 01/03/2023 2133   LABSPEC 1.015 01/03/2023 2133   PHURINE 5.0 01/03/2023  2133   GLUCOSEU NEGATIVE 01/03/2023 2133   HGBUR NEGATIVE 01/03/2023 2133   BILIRUBINUR NEGATIVE 01/03/2023 2133   KETONESUR NEGATIVE 01/03/2023 2133   PROTEINUR NEGATIVE 01/03/2023 2133   UROBILINOGEN 0.2 01/23/2010 1750   NITRITE NEGATIVE 01/03/2023 2133   LEUKOCYTESUR NEGATIVE 01/03/2023 2133   Sepsis Labs Recent Labs  Lab 01/08/23 0430 01/09/23 0221 01/11/23 0343 01/12/23 0334  WBC 12.8* 16.2* 14.4* 13.5*   Microbiology Recent Results (from the past 240 hours)  Blood culture (routine x 2)     Status: None   Collection Time: 01/03/23  6:33 PM   Specimen: BLOOD  Result Value Ref Range Status   Specimen Description   Final    BLOOD PORTA CATH Performed at [facility], [address], [place], [place] [address]    Special Requests   Final    BOTTLES DRAWN AEROBIC AND ANAEROBIC Blood Culture adequate volume Performed at [facility], [address] [place], [place] [address]    Culture   Final    NO GROWTH 5  DAYS Performed at [facility], [address], [place], [place] [address]    Report Status 01/08/2023 FINAL  Final  Blood culture (routine x 2)     Status: None   Collection Time: 01/03/23  6:38 PM   Specimen: BLOOD  Result Value Ref Range Status   Specimen Description   Final    BLOOD LEFT ANTECUBITAL Performed at [facility], [address] [place], [place] [address]    Special Requests   Final    BOTTLES DRAWN AEROBIC AND ANAEROBIC Blood Culture adequate volume Performed at [facility], [address], [place], [place] [address]    Culture   Final    NO GROWTH 5 DAYS Performed at [facility], [address], [place], [place] [address]    Report Status 01/08/2023 FINAL  Final  Aerobic/Anaerobic Culture w Gram Stain (surgical/deep wound)     Status: None   Collection Time: 01/06/23  2:49 PM   Specimen: PATH GI biopsy; Tissue  Result Value Ref Range Status   Specimen Description   Final    CYSTS Performed at [facility], [address], [place], [place] [address]    Special Requests   Final    NONE Performed at [facility], [address] [place], [place] [address]    Gram Stain NO WBC SEEN NO ORGANISMS SEEN   Final   Culture   Final    RARE CANDIDA ALBICANS NO ANAEROBES ISOLATED Performed at [facility], [address] [place], [place] [address]    Report Status 01/11/2023 FINAL  Final     Time coordinating discharge: Over 30 minutes  SIGNED:   Willeen Niece, MD  [facility] 01/12/2023, 2:07 PM Pager   If 7PM-7AM, please contact night-coverage

## 2023-01-12 NOTE — Progress Notes (Signed)
Pt given discharge paper work but patient refused discharge teaching, saying " I will read it on my way home".  RN confirmed correct pharmacy and need to get eliquis prescription.  Taken to front entrance with belongings.  Transportation provided by husband.

## 2023-01-12 NOTE — Discharge Instructions (Signed)
Advised to follow-up with primary care physician in 1 week. Advised to continue current medications as prescribed. 

## 2023-01-13 ENCOUNTER — Other Ambulatory Visit: Payer: Self-pay | Admitting: Hematology

## 2023-01-13 ENCOUNTER — Telehealth: Payer: Self-pay

## 2023-01-13 ENCOUNTER — Other Ambulatory Visit: Payer: Medicaid Other

## 2023-01-13 NOTE — Transitions of Care (Post Inpatient/ED Visit) (Unsigned)
   01/13/2023  Name: Kathryn Underwood MRN: [id number] DOB: 30-Mar-1960  Today's TOC FU Call Status: Unsuccessful Call (1st Attempt) Date: 01/13/23  Attempted to reach the patient regarding the most recent Inpatient/ED visit.  Follow Up Plan: Additional outreach attempts will be made to reach the patient to complete the Transitions of Care (Post Inpatient/ED visit) call.   Signature Karena Addison, LPN [facility] Nurse Health Advisor Direct Dial [phone]

## 2023-01-14 ENCOUNTER — Other Ambulatory Visit: Payer: Medicaid Other

## 2023-01-14 ENCOUNTER — Inpatient Hospital Stay: Payer: Medicaid Other | Admitting: Nurse Practitioner

## 2023-01-14 ENCOUNTER — Telehealth: Payer: Self-pay | Admitting: *Deleted

## 2023-01-14 ENCOUNTER — Inpatient Hospital Stay: Payer: Medicaid Other

## 2023-01-14 NOTE — Transitions of Care (Post Inpatient/ED Visit) (Signed)
01/14/2023  Name: Kathryn Underwood MRN: [id number] DOB: [birth year]  Today's TOC FU Call Status: Today's TOC FU Call Status:: Successful TOC FU Call Completed Unsuccessful Call (1st Attempt) Date: 01/13/23 [facility] FU Call Complete Date: 01/14/23 Patient's Name and Date of Birth confirmed.  Transition Care Management Follow-up Telephone Call Date of Discharge: 01/12/23 Discharge Facility: [facility]) Type of Discharge: Inpatient Admission Primary Inpatient Discharge Diagnosis:: weakness How have you been since you were released from the hospital?: Better Any questions or concerns?: No  Items Reviewed: Did you receive and understand the discharge instructions provided?: Yes Medications obtained,verified, and reconciled?: Yes (Medications Reviewed) Any new allergies since your discharge?: No Dietary orders reviewed?: NA Do you have support at home?: Yes People in Home: significant other  Medications Reviewed Today: Medications Reviewed Today     Reviewed by Karena Addison, LPN (Licensed Practical Nurse) on 01/14/23 at 1234  Med List Status: <None>   Medication Order Taking? Sig Documenting Provider Last Dose Status Informant  acetaminophen (TYLENOL) 500 MG tablet [id number] No Take 500-1,000 mg by mouth every 6 (six) hours as needed for moderate pain (pain score 4-6). [provider] Unk Active Spouse/Significant Other, Pharmacy Records  apixaban (ELIQUIS) 5 MG TABS tablet [id number]  Take 1 tablet (5 mg total) by mouth 2 (two) times daily. Willeen Niece, MD  Active   Ensure Max Protein (ENSURE MAX PROTEIN) LIQD [id number] No Take 330 mLs (11 oz total) by mouth 2 (two) times daily. Almon Hercules, MD 01/03/2023 Active Pharmacy Records, Spouse/Significant Other  gabapentin (NEURONTIN) 300 MG capsule [id number] No Take 2 capsules (600 mg total) by mouth 2 (two) times daily.  Patient taking differently: Take 900 mg by mouth at bedtime.   Rolan Bucco, MD Past Week Active  Pharmacy Records, Spouse/Significant Other  lidocaine-prilocaine (EMLA) cream [id number] No Apply to affected area once Malachy Mood, MD Unk Active Spouse/Significant Other, Pharmacy Records  mirtazapine (REMERON) 45 MG tablet [id number] No Take 45 mg by mouth at bedtime. [provider] Past Week Active Pharmacy Records, Spouse/Significant Other  ondansetron (ZOFRAN) 8 MG tablet [id number] No Take 1 tablet (8 mg total) by mouth every 8 (eight) hours as needed for nausea or vomiting. Malachy Mood, MD 01/03/2023 Active Spouse/Significant Other, Pharmacy Records  oxyCODONE (OXY IR/ROXICODONE) 5 MG immediate release tablet [id number] No Take 1 tablet (5 mg total) by mouth every 4 (four) hours as needed for moderate pain (pain score 4-6). Alwyn Ren, MD Past Week Active Spouse/Significant Other, Pharmacy Records           Med Note Sedonia Small Jan 04, 2023  8:34 AM) S/O does not know if she took yesterday, but definitely within past 7 days.   pantoprazole (PROTONIX) 40 MG tablet [id number] No Take 1 tablet (40 mg total) by mouth 2 (two) times daily. Alwyn Ren, MD 01/03/2023 Active Spouse/Significant Other, Pharmacy Records  prochlorperazine (COMPAZINE) 10 MG tablet [id number] No Take 1 tablet (10 mg total) by mouth every 6 (six) hours as needed for nausea or vomiting. Malachy Mood, MD 01/03/2023 Active Spouse/Significant Other, Pharmacy Records  QUEtiapine (SEROQUEL) 100 MG tablet [id number] No Take 200 mg by mouth at bedtime. [provider] Past Week Active Pharmacy Records, Spouse/Significant Other  sodium chloride flush (NS) 0.9 % SOLN [id number] No Use 5 - 10 mLs to flush abdominal drain once daily as directed Allred, Rosalita Levan, PA-C Taking Active Pharmacy Records, Spouse/Significant Other  sucralfate (CARAFATE) 1 GM/10ML suspension [id number]  No Take 10 mLs (1 g total) by mouth 4 (four) times daily -  with meals and at bedtime. Allwardt, Crist Infante, PA-C 01/03/2023 Active  Pharmacy Records, Spouse/Significant Other  Vitamin D, Ergocalciferol, (DRISDOL) 1.25 MG (50000 UNIT) CAPS capsule [id number] No Take 1 capsule (50,000 Units total) by mouth every 7 (seven) days. Alwyn Ren, MD Past Week Active Spouse/Significant Other, Pharmacy Records            Home Care and Equipment/Supplies: Were Home Health Services Ordered?: NA Any new equipment or medical supplies ordered?: NA  Functional Questionnaire: Do you need assistance with bathing/showering or dressing?: No Do you need assistance with meal preparation?: No Do you need assistance with eating?: No Do you have difficulty maintaining continence: No Do you need assistance with getting out of bed/getting out of a chair/moving?: No Do you have difficulty managing or taking your medications?: No  Follow up appointments reviewed: PCP Follow-up appointment confirmed?: No (no avail appts, sent message to staff to schedule) MD Provider Line Number:[phone] Given: No Specialist Hospital Follow-up appointment confirmed?: NA Do you need transportation to your follow-up appointment?: No Do you understand care options if your condition(s) worsen?: Yes-patient verbalized understanding    SIGNATURE Karena Addison, LPN [facility] Nurse Health Advisor Direct Dial [phone]

## 2023-01-14 NOTE — Telephone Encounter (Signed)
Noted and agreed, thank you. 

## 2023-01-14 NOTE — Telephone Encounter (Signed)
Okay to give orders? Please advise.  Copied from CRM [id number]. Topic: Clinical - Home Health Verbal Orders >> Jan 14, 2023 12:51 PM Corin V wrote: Caller/Agency: [facility] Callback Number: [phone] Service Requested: Occupational Therapy Frequency: 1 week 1 12/29-1/4 Any new concerns about the patient? No

## 2023-01-14 NOTE — Telephone Encounter (Signed)
Copied from CRM [id number]. Topic: Clinical - Home Health Verbal Orders >> Jan 14, 2023  1:16 PM Joanette Gula wrote: Caller/Agency: [facility] Callback Number: [phone] Service Requested: Skilled Nursing Home Health Frequency: Any new concerns about the patient? Yes... Heart rate was 112...  BP was 92/58.Marland Kitchen Have not been able to make bowel movements.

## 2023-01-15 ENCOUNTER — Telehealth: Payer: Self-pay | Admitting: Physician Assistant

## 2023-01-15 ENCOUNTER — Ambulatory Visit: Payer: Self-pay | Admitting: Physician Assistant

## 2023-01-15 NOTE — Telephone Encounter (Signed)
  Chief Complaint: back pain Symptoms: back pain goes from ribs around to back Frequency: constant at night, intermittent during day Pertinent Negatives: Patient denies fever, weakness, abdominal pain, pain with urination Disposition: [] ED /[] Urgent Care (no appt availability in office) / [x] Appointment(In office/virtual)/ []  [place] [facility]/ [] Home Care/ [] Refused Recommended Disposition /[] [place] [facility]/ []  Follow-up with PCP Additional Notes: patient stated she started experiencing back pain while in  hospital after stents were placed, she was released from hospital on December 17. Patient confirmed she made physicians in hospital during her hospital stay aware of the back pain, states she was being treated with morphine. Patient states she has taken some oxycodone she had from before her hospital visit to help with pain but she ran out yesterday. Patient states she has been in and out of the hospital 3 times this year. Denies abdominal pain, fever, weakness, pain with urination. Based off disposition, patient needs to be seen sooner than earliest available appointment time. Will check with office to see if they can fit her in sooner. Attempted to call patient back to give her home care advice for how to manage pain until she is seen - left a voicemail.    Copied from CRM [id number]. Topic: Clinical - Red Word Triage >> Jan 15, 2023 10:18 AM Fonda Kinder J wrote: Red Word that prompted transfer to Nurse Triage: Pain Reason for Disposition  [1] MODERATE back pain (e.g., interferes with normal activities) AND [2] present > 3 days  Answer Assessment - Initial Assessment Questions 1. ONSET: "When did the pain begin?"      Since she was in the hospital, 2. LOCATION: "Where does it hurt?" (upper, mid or lower back)     Lower back 3. SEVERITY: "How bad is the pain?"  (e.g., Scale 1-10; mild, moderate, or severe)   - MILD (1-3): Doesn't interfere with normal activities.    - MODERATE (4-7):  Interferes with normal activities or awakens from sleep.    - SEVERE (8-10): Excruciating pain, unable to do any normal activities.      7 4. PATTERN: "Is the pain constant?" (e.g., yes, no; constant, intermittent)      Pretty constant at night, comes and goes some during the day 5. RADIATION: "Does the pain shoot into your legs or somewhere else?"     Wraps around from her rib cage all around her back 6. CAUSE:  "What do you think is causing the back pain?"      "I really don't - they were treating me with morphine in the hospital" 7. BACK OVERUSE:  "Any recent lifting of heavy objects, strenuous work or exercise?"     No 8. MEDICINES: "What have you taken so far for the pain?" (e.g., nothing, acetaminophen, NSAIDS)     Has been taking oxycodone that she has had before going into the hospital 9. NEUROLOGIC SYMPTOMS: "Do you have any weakness, numbness, or problems with bowel/bladder control?"     No 10. OTHER SYMPTOMS: "Do you have any other symptoms?" (e.g., fever, abdomen pain, burning with urination, blood in urine)       No  Protocols used: Back Pain-A-AH

## 2023-01-15 NOTE — Telephone Encounter (Signed)
Pt was in the hospital for a stint and would like to know if she can get more  oxyCODONE (OXY IR/ROXICODONE) 5 MG immediate release tablet  She is still in a lot of pain. Please advise.

## 2023-01-16 ENCOUNTER — Other Ambulatory Visit: Payer: Self-pay

## 2023-01-16 ENCOUNTER — Emergency Department (HOSPITAL_BASED_OUTPATIENT_CLINIC_OR_DEPARTMENT_OTHER)
Admission: EM | Admit: 2023-01-16 | Discharge: 2023-01-16 | Disposition: A | Discharge status: Home / Self Care | Payer: Medicaid Other | Attending: Emergency Medicine | Admitting: Emergency Medicine

## 2023-01-16 ENCOUNTER — Encounter (HOSPITAL_BASED_OUTPATIENT_CLINIC_OR_DEPARTMENT_OTHER): Payer: Self-pay

## 2023-01-16 DIAGNOSIS — Z8507 Personal history of malignant neoplasm of pancreas: Secondary | ICD-10-CM | POA: Insufficient documentation

## 2023-01-16 DIAGNOSIS — T85590A Other mechanical complication of bile duct prosthesis, initial encounter: Secondary | ICD-10-CM | POA: Diagnosis present

## 2023-01-16 DIAGNOSIS — Z7901 Long term (current) use of anticoagulants: Secondary | ICD-10-CM | POA: Diagnosis not present

## 2023-01-16 DIAGNOSIS — T85518A Breakdown (mechanical) of other gastrointestinal prosthetic devices, implants and grafts, initial encounter: Secondary | ICD-10-CM

## 2023-01-16 NOTE — ED Notes (Signed)
Pt is only needing a replacement tubing/bag for her urostomy, which we do not carry. Reached out to [facility] long ER charge nurse Gaetano Net.

## 2023-01-16 NOTE — ED Triage Notes (Signed)
Patient presents with c/o of leaking urinary drainage system. Denies urinary complaints or issues. Requesting new drainage system.

## 2023-01-16 NOTE — ED Provider Notes (Signed)
[place] [facility]MENT AT [facility] Provider Note   CSN: [id number] Arrival date & time: 01/16/23  1109     History  Chief Complaint  Patient presents with   Medical Management of Chronic Issues    Kathryn Underwood is a 62 y.o. female.  Pt is a 62y/o female with hx of pancreatic CA with biliary drain who is presenting today because her bag system is leaking and she needs a new bag.  She does report that she has significant ongoing chronic problems including pain related to her cancer but the only reason she came today is to get a new bag.  The history is provided by the patient and the spouse.       Home Medications Prior to Admission medications   Medication Sig Start Date End Date Taking? Authorizing Provider  acetaminophen (TYLENOL) 500 MG tablet Take 500-1,000 mg by mouth every 6 (six) hours as needed for moderate pain (pain score 4-6).    [provider]  apixaban (ELIQUIS) 5 MG TABS tablet Take 1 tablet (5 mg total) by mouth 2 (two) times daily. 01/12/23   Willeen Niece, MD  Ensure Max Protein (ENSURE MAX PROTEIN) LIQD Take 330 mLs (11 oz total) by mouth 2 (two) times daily. 12/18/22   Almon Hercules, MD  gabapentin (NEURONTIN) 300 MG capsule Take 2 capsules (600 mg total) by mouth 2 (two) times daily. Patient taking differently: Take 900 mg by mouth at bedtime. 10/09/17   Rolan Bucco, MD  lidocaine-prilocaine (EMLA) cream Apply to affected area once 12/28/22   Malachy Mood, MD  mirtazapine (REMERON) 45 MG tablet Take 45 mg by mouth at bedtime. 11/05/21   [provider]  ondansetron (ZOFRAN) 8 MG tablet Take 1 tablet (8 mg total) by mouth every 8 (eight) hours as needed for nausea or vomiting. 12/28/22   Malachy Mood, MD  oxyCODONE (OXY IR/ROXICODONE) 5 MG immediate release tablet Take 1 tablet (5 mg total) by mouth every 4 (four) hours as needed for moderate pain (pain score 4-6). 12/27/22   Alwyn Ren, MD  pantoprazole (PROTONIX)  40 MG tablet Take 1 tablet (40 mg total) by mouth 2 (two) times daily. 12/27/22   Alwyn Ren, MD  prochlorperazine (COMPAZINE) 10 MG tablet Take 1 tablet (10 mg total) by mouth every 6 (six) hours as needed for nausea or vomiting. 12/28/22   Malachy Mood, MD  QUEtiapine (SEROQUEL) 100 MG tablet Take 200 mg by mouth at bedtime.    [provider]  sodium chloride flush (NS) 0.9 % SOLN Use 5 - 10 mLs to flush abdominal drain once daily as directed 12/18/22   Allred, Darrell K, PA-C  sucralfate (CARAFATE) 1 GM/10ML suspension Take 10 mLs (1 g total) by mouth 4 (four) times daily -  with meals and at bedtime. 12/23/22   Allwardt, Crist Infante, PA-C  Vitamin D, Ergocalciferol, (DRISDOL) 1.25 MG (50000 UNIT) CAPS capsule Take 1 capsule (50,000 Units total) by mouth every 7 (seven) days. 01/02/23   Alwyn Ren, MD      Allergies    Patient has no known allergies.    Review of Systems   Review of Systems  Physical Exam Updated Vital Signs BP 91/76 (BP Location: Right Arm)   Pulse (!) 118   Temp (!) 97.5 F (36.4 C)   Resp 19   SpO2 94%  Physical Exam Vitals and nursing note reviewed.  Abdominal:     Comments: Drain present on  the right abdomen and not leaking from the bag that the bile is draining into.  Neurological:     Mental Status: She is alert. Mental status is at baseline.     ED Results / Procedures / Treatments   Labs (all labs ordered are listed, but only abnormal results are displayed) Labs Reviewed - No data to display  EKG EKG Interpretation Date/Time:  Saturday January 16 2023 11:29:03 EST Ventricular Rate:  116 PR Interval:  150 QRS Duration:  72 QT Interval:  334 QTC Calculation: 464 R Axis:   85  Text Interpretation: Sinus tachycardia Anterior infarct , age undetermined When compared with ECG of 08-Jan-2023 04:57, No significant change was found Confirmed by Gwyneth Sprout ([id number]) on 01/16/2023 12:45:57 PM  Radiology No results  found.  Procedures Procedures    Medications Ordered in ED Medications - No data to display  ED Course/ Medical Decision Making/ A&P                                 Medical Decision Making  Pt here just requesting a replacement bag for her cystostomy tube.  We do not carry the bags here.  She has no other acute complaints at this time.  Attempting to find new bag and tubing for the pt.  No further work up done.        Final Clinical Impression(s) / ED Diagnoses Final diagnoses:  Cholecystostomy tube dysfunction, initial encounter    Rx / DC Orders ED Discharge Orders     None         Gwyneth Sprout, MD 01/16/23 1249

## 2023-01-16 NOTE — ED Notes (Signed)
Pt instructed to go to [facility] main entrance for bag

## 2023-01-18 ENCOUNTER — Other Ambulatory Visit: Payer: Self-pay | Admitting: Physician Assistant

## 2023-01-18 NOTE — Telephone Encounter (Signed)
Returned [facility] w/ [facility] call and advised ok for verbal orders

## 2023-01-18 NOTE — Telephone Encounter (Signed)
Please see pt message and advise 

## 2023-01-18 NOTE — Telephone Encounter (Signed)
Please see PCP note and schedule patient with any provider with openings

## 2023-01-18 NOTE — Telephone Encounter (Signed)
Returned call and lvm for patient with details per provider. Advised cb number incase needed

## 2023-01-19 ENCOUNTER — Telehealth: Payer: Self-pay

## 2023-01-19 ENCOUNTER — Encounter (HOSPITAL_COMMUNITY): Payer: Self-pay

## 2023-01-19 ENCOUNTER — Other Ambulatory Visit: Payer: Self-pay

## 2023-01-19 ENCOUNTER — Emergency Department (HOSPITAL_COMMUNITY)
Admission: EM | Admit: 2023-01-19 | Discharge: 2023-01-19 | Disposition: A | Discharge status: Home / Self Care | Payer: Medicaid Other | Attending: Student | Admitting: Student

## 2023-01-19 ENCOUNTER — Emergency Department (HOSPITAL_COMMUNITY): Payer: Medicaid Other

## 2023-01-19 DIAGNOSIS — R1013 Epigastric pain: Secondary | ICD-10-CM | POA: Diagnosis not present

## 2023-01-19 DIAGNOSIS — R109 Unspecified abdominal pain: Secondary | ICD-10-CM | POA: Diagnosis present

## 2023-01-19 DIAGNOSIS — J449 Chronic obstructive pulmonary disease, unspecified: Secondary | ICD-10-CM | POA: Diagnosis not present

## 2023-01-19 DIAGNOSIS — Z8507 Personal history of malignant neoplasm of pancreas: Secondary | ICD-10-CM | POA: Insufficient documentation

## 2023-01-19 DIAGNOSIS — R1084 Generalized abdominal pain: Secondary | ICD-10-CM | POA: Diagnosis not present

## 2023-01-19 DIAGNOSIS — D72829 Elevated white blood cell count, unspecified: Secondary | ICD-10-CM | POA: Diagnosis not present

## 2023-01-19 DIAGNOSIS — Z7901 Long term (current) use of anticoagulants: Secondary | ICD-10-CM | POA: Diagnosis not present

## 2023-01-19 LAB — CBC WITH DIFFERENTIAL/PLATELET
Abs Immature Granulocytes: 0.05 10*3/uL (ref 0.00–0.07)
Basophils Absolute: 0.1 10*3/uL (ref 0.0–0.1)
Basophils Relative: 1 %
Eosinophils Absolute: 0.3 10*3/uL (ref 0.0–0.5)
Eosinophils Relative: 3 %
HCT: 25 % — ABNORMAL LOW (ref 36.0–46.0)
Hemoglobin: 8.3 g/dL — ABNORMAL LOW (ref 12.0–15.0)
Immature Granulocytes: 1 %
Lymphocytes Relative: 19 %
Lymphs Abs: 2.1 10*3/uL (ref 0.7–4.0)
MCH: 32 pg (ref 26.0–34.0)
MCHC: 33.2 g/dL (ref 30.0–36.0)
MCV: 96.5 fL (ref 80.0–100.0)
Monocytes Absolute: 0.8 10*3/uL (ref 0.1–1.0)
Monocytes Relative: 7 %
Neutro Abs: 7.6 10*3/uL (ref 1.7–7.7)
Neutrophils Relative %: 69 %
Platelets: 635 10*3/uL — ABNORMAL HIGH (ref 150–400)
RBC: 2.59 MIL/uL — ABNORMAL LOW (ref 3.87–5.11)
RDW: 14.9 % (ref 11.5–15.5)
WBC: 10.9 10*3/uL — ABNORMAL HIGH (ref 4.0–10.5)
nRBC: 0 % (ref 0.0–0.2)

## 2023-01-19 LAB — LIPASE, BLOOD: Lipase: 54 U/L — ABNORMAL HIGH (ref 11–51)

## 2023-01-19 LAB — COMPREHENSIVE METABOLIC PANEL
ALT: 22 U/L (ref 0–44)
AST: 26 U/L (ref 15–41)
Albumin: 2.5 g/dL — ABNORMAL LOW (ref 3.5–5.0)
Alkaline Phosphatase: 683 U/L — ABNORMAL HIGH (ref 38–126)
Anion gap: 8 (ref 5–15)
BUN: 20 mg/dL (ref 8–23)
CO2: 25 mmol/L (ref 22–32)
Calcium: 8.4 mg/dL — ABNORMAL LOW (ref 8.9–10.3)
Chloride: 103 mmol/L (ref 98–111)
Creatinine, Ser: 0.36 mg/dL — ABNORMAL LOW (ref 0.44–1.00)
GFR, Estimated: >60 mL/min (ref >60)
Glucose, Bld: 155 mg/dL — ABNORMAL HIGH (ref 70–99)
Potassium: 3.4 mmol/L — ABNORMAL LOW (ref 3.5–5.1)
Sodium: 136 mmol/L (ref 135–145)
Total Bilirubin: 0.5 mg/dL (ref <1.2)
Total Protein: 5.6 g/dL — ABNORMAL LOW (ref 6.5–8.1)

## 2023-01-19 LAB — I-STAT CG4 LACTIC ACID, ED: Lactic Acid, Venous: 1.4 mmol/L (ref 0.5–1.9)

## 2023-01-19 MED ORDER — OXYCODONE HCL 5 MG PO TABS: 5.0000 mg | ORAL_TABLET | ORAL | 0 refills | Status: DC | PRN

## 2023-01-19 MED ORDER — MORPHINE SULFATE (PF) 4 MG/ML IV SOLN
4.0000 mg | Freq: Once | INTRAVENOUS | Status: AC
  Administered 2023-01-19: 4 mg via INTRAVENOUS
  Filled 2023-01-19: qty 1

## 2023-01-19 MED ORDER — HEPARIN SOD (PORK) LOCK FLUSH 100 UNIT/ML IV SOLN
500.0000 [IU] | Freq: Once | INTRAVENOUS | Status: AC
  Administered 2023-01-19: 500 [IU]
  Filled 2023-01-19: qty 5

## 2023-01-19 MED ORDER — IOHEXOL 300 MG/ML  SOLN
75.0000 mL | Freq: Once | INTRAMUSCULAR | Status: AC | PRN
  Administered 2023-01-19: 75 mL via INTRAVENOUS

## 2023-01-19 MED ORDER — ONDANSETRON HCL 4 MG/2ML IJ SOLN
4.0000 mg | Freq: Once | INTRAMUSCULAR | Status: AC
  Administered 2023-01-19: 4 mg via INTRAVENOUS
  Filled 2023-01-19: qty 2

## 2023-01-19 NOTE — Telephone Encounter (Signed)
Called pt to advise Rx sent to pharmacy, pt wasn't aware was told by call center that didn't look like medication would be filled any longer. Advised of refill being sent and confirmed pt appt with PCP for 01/21/23. Pt verbalized understanding

## 2023-01-19 NOTE — Telephone Encounter (Signed)
Copied from CRM [id number]. Topic: Clinical - Medication Question >> Jan 19, 2023  9:53 AM Denese Killings wrote: Reason for CRM: Patient has questions regarding the discontinuation of sucralfate (CARAFATE) 1 GM/10ML suspension. Patient is requesting a callback.  Please see note above; pt refill request approved and medication above sent to pharmacy today for patient. Please advise

## 2023-01-19 NOTE — Telephone Encounter (Signed)
Please advise on refills... pt appt in office 01/21/23

## 2023-01-19 NOTE — ED Triage Notes (Signed)
Pt has pancreatic cancer and starts chemo next week. Pt was discharged from hospital x 1 week ago but pain has gotten worse. Pain is in abdomen and mid-back.

## 2023-01-19 NOTE — ED Provider Notes (Signed)
Patient is seen by Dr. Earle Gell pending results of abdominal CT which he states she is unchanged.  Her pain is much better at this time.  Will discharge home on analgesics   Lorre Nick, MD 01/19/23 0830

## 2023-01-19 NOTE — ED Provider Notes (Signed)
[place] EMERGENCY DEPARTMENT AT [facility] Provider Note  CSN: [id number] Arrival date & time: 01/19/23 9562  Chief Complaint(s) Abdominal Pain  HPI Kathryn Underwood is a 62 y.o. 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  States symptoms have been gradually worsening over the last week worse in epigastrium radiating around to the back.  Denies associated nausea and vomiting states that the symptoms have been greatly improved with her gastric stent.  Denies shortness of breath, fever, cough or other systemic symptoms.   Past Medical History Past Medical History:  Diagnosis Date   Alcohol abuse    Anxiety    Chickenpox    Depression    Neuromuscular disorder (HCC)    neuropathy   Pancreatic cancer (HCC)    Substance abuse (HCC)    Patient Active Problem List   Diagnosis Date Noted   Overlapping malignant neoplasm of pancreas (HCC) 01/04/2023   Failure to thrive (child) 01/04/2023   Gastric outlet obstruction 01/04/2023   Hyponatremia 01/03/2023   Dehydration 12/28/2022   SIRS (systemic inflammatory response syndrome) (HCC) 12/24/2022   Abdominal pain 12/23/2022   Pancreatic adenocarcinoma (HCC) 12/16/2022   Biliary obstruction 12/15/2022   Partial gastric outlet obstruction 12/15/2022   High serum carbohydrate antigen 19-9 (CA19-9) 12/12/2022   Pancreatic cyst 12/12/2022   Abnormal CT scan, gastrointestinal tract 12/11/2022   Acute esophagitis 12/10/2022   Acute on chronic pancreatitis (HCC) 12/09/2022   Protein malnutrition (HCC) 12/09/2022   Unintentional weight loss 12/09/2022   Esophageal dysphagia 12/09/2022   Dilation of pancreatic duct  12/09/2022   Protein-calorie malnutrition, severe 12/09/2022   Pancreatic mass 12/08/2022   Multifocal atrial tachycardia (HCC) 08/31/2021   Delirium tremens (HCC) 07/27/2021   Pancreas hemorrhage 07/27/2021   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   Uterine fibroid 07/27/2021   COPD (chronic obstructive pulmonary disease) (HCC) 07/27/2021   Insomnia 07/27/2021   Pulmonary nodule 07/25/2021   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/25/2021   Alcohol induced acute pancreatitis without necrosis or infection 10/05/2017   Alcoholic ketoacidosis 10/04/2017   Epigastric pain 10/04/2017   MDD (major depressive disorder), recurrent severe, 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   Alcohol withdrawal (HCC) 04/25/2016   Sinus tachycardia 04/25/2016   Transaminitis 04/25/2016   Leukocytosis 04/25/2016   Closed nondisplaced fracture of fifth right metatarsal bone 04/08/2016   Acute pain of right shoulder 04/08/2016   Vitamin D deficiency 12/11/2015   Alcohol dependence with unspecified alcohol-induced disorder (HCC) 12/09/2015   Fatigue 12/09/2015   Encounter for alcohol abuse counseling and surveillance 12/09/2015   Cough 01/04/2015   Tobacco abuse 01/04/2015   Depression with anxiety 01/04/2015   Home Medication(s) Prior to Admission medications   Medication Sig Start Date End Date Taking? Authorizing Provider  oxyCODONE (ROXICODONE) 5 MG immediate release tablet Take 1 tablet (5 mg total) by mouth every 4 (four) hours as needed for severe pain (pain score 7-10) or moderate pain (pain score 4-6). 01/19/23  Yes Lorre Nick, MD  acetaminophen (TYLENOL) 500 MG tablet Take 500-1,000 mg by mouth every 6 (six) hours as needed for moderate pain (pain score 4-6).    [provider]  apixaban (ELIQUIS) 5 MG TABS tablet Take 1 tablet (5 mg total) by mouth 2 (two) times daily. 01/12/23  Willeen Niece, MD  Ensure Max Protein (ENSURE MAX PROTEIN) LIQD Take 330 mLs (11 oz total) by mouth 2 (two) times  daily. 12/18/22   Almon Hercules, MD  gabapentin (NEURONTIN) 300 MG capsule Take 2 capsules (600 mg total) by mouth 2 (two) times daily. Patient taking differently: Take 900 mg by mouth at bedtime. 10/09/17   Rolan Bucco, MD  lidocaine-prilocaine (EMLA) cream Apply to affected area once 12/28/22   Malachy Mood, MD  mirtazapine (REMERON) 45 MG tablet Take 45 mg by mouth at bedtime. 11/05/21   [provider]  ondansetron (ZOFRAN) 8 MG tablet Take 1 tablet (8 mg total) by mouth every 8 (eight) hours as needed for nausea or vomiting. 12/28/22   Malachy Mood, MD  oxyCODONE (OXY IR/ROXICODONE) 5 MG immediate release tablet Take 1 tablet (5 mg total) by mouth every 4 (four) hours as needed for moderate pain (pain score 4-6). 12/27/22   Alwyn Ren, MD  pantoprazole (PROTONIX) 40 MG tablet Take 1 tablet (40 mg total) by mouth 2 (two) times daily. 12/27/22   Alwyn Ren, MD  prochlorperazine (COMPAZINE) 10 MG tablet Take 1 tablet (10 mg total) by mouth every 6 (six) hours as needed for nausea or vomiting. 12/28/22   Malachy Mood, MD  QUEtiapine (SEROQUEL) 100 MG tablet Take 200 mg by mouth at bedtime.    [provider]  sodium chloride flush (NS) 0.9 % SOLN Use 5 - 10 mLs to flush abdominal drain once daily as directed 12/18/22   Allred, Darrell K, PA-C  sucralfate (CARAFATE) 1 GM/10ML suspension TAKE 10 MLS (1 G TOTAL) BY MOUTH 4 (FOUR) TIMES DAILY - WITH MEALS AND AT BEDTIME. 01/19/23   Allwardt, Alyssa M, PA-C  Vitamin D, Ergocalciferol, (DRISDOL) 1.25 MG (50000 UNIT) CAPS capsule Take 1 capsule (50,000 Units total) by mouth every 7 (seven) days. 01/02/23   Alwyn Ren, MD                                                                                                                                    Past Surgical History Past Surgical History:  Procedure Laterality Date   BIOPSY  12/10/2022   Procedure: BIOPSY;  Surgeon: Meridee Score Netty Starring., MD;  Location: [facility];  Service: Gastroenterology;;   CESAREAN SECTION     DUODENAL STENT PLACEMENT N/A 01/06/2023   Procedure: DUODENAL STENT PLACEMENT;  Surgeon: Lemar Lofty., MD;  Location: [facility];  Service: Gastroenterology;  Laterality: N/A;   ESOPHAGOGASTRODUODENOSCOPY N/A 08/14/2021   Procedure: ESOPHAGOGASTRODUODENOSCOPY (EGD);  Surgeon: Rachael Fee, MD;  Location: [facility];  Service: Gastroenterology;  Laterality: N/A;   ESOPHAGOGASTRODUODENOSCOPY N/A 12/12/2022   Procedure: ESOPHAGOGASTRODUODENOSCOPY (EGD);  Surgeon: Lemar Lofty., MD;  Location: [facility];  Service: Gastroenterology;  Laterality: N/A;   ESOPHAGOGASTRODUODENOSCOPY (EGD) WITH PROPOFOL N/A 12/10/2022   Procedure: ESOPHAGOGASTRODUODENOSCOPY (EGD) WITH PROPOFOL;  Surgeon: Lemar Lofty., MD;  Location: [facility] END[facility]COPY;  Service: Gastroenterology;  Laterality: N/A;   ESOPHAGOGASTRODUODENOSCOPY (EGD) WITH PROPOFOL N/A 01/06/2023   Procedure: ESOPHAGOGASTRODUODENOSCOPY (EGD) WITH PROPOFOL;  Surgeon: Meridee Score Netty Starring., MD;  Location: [facility] ENDOSCOPY;  Service: Gastroenterology;  Laterality: N/A;  with duodenal stent placement   EUS N/A 08/14/2021   Procedure: UPPER ENDOSCOPIC ULTRASOUND (EUS) RADIAL;  Surgeon: Rachael Fee, MD;  Location: [facility] END[facility];  Service: Gastroenterology;  Laterality: N/A;   EUS N/A 12/12/2022   Procedure: UPPER ENDOSCOPIC ULTRASOUND (EUS) LINEAR;  Surgeon: Lemar Lofty., MD;  Location: [facility];  Service: Gastroenterology;  Laterality: N/A;   EUS N/A 01/06/2023   Procedure: FULL UPPER ENDOSCOPIC ULTRASOUND (EUS) RADIAL;  Surgeon: Meridee Score Netty Starring., MD;  Location: [facility];  Service: Gastroenterology;  Laterality: N/A;   FINE NEEDLE ASPIRATION N/A 08/14/2021   Procedure: FINE NEEDLE ASPIRATION (FNA) LINEAR;  Surgeon: Rachael Fee, MD;  Location: [facility];  Service: Gastroenterology;  Laterality: N/A;   FINE NEEDLE ASPIRATION N/A  12/12/2022   Procedure: FINE NEEDLE ASPIRATION (FNA) LINEAR;  Surgeon: Lemar Lofty., MD;  Location: [facility];  Service: Gastroenterology;  Laterality: N/A;   FINE NEEDLE ASPIRATION  01/06/2023   Procedure: FINE NEEDLE ASPIRATION;  Surgeon: Lemar Lofty., MD;  Location: [facility];  Service: Gastroenterology;;   IR IMAGING GUIDED PORT INSERTION  12/18/2022   IR PERC CHOLECYSTOSTOMY  12/13/2022   TONSILLECTOMY     Family History Family History  Problem Relation Age of Onset   Dementia Mother    Arthritis Father    Asthma Father    Asthma Brother    Diabetes Maternal Grandmother    Dementia Maternal Grandmother    Diabetes Paternal Grandmother    Parkinson's disease Maternal Grandfather    Parkinson's disease Paternal Grandfather    Breast cancer Neg Hx    Colon cancer Neg Hx     Social History Social History   Tobacco Use   Smoking status: Some Days    Current packs/day: 0.50    Types: Cigarettes   Smokeless tobacco: Never  Vaping Use   Vaping status: Never Used  Substance Use Topics   Alcohol use: Not Currently    Alcohol/week: 16.0 standard drinks of alcohol    Types: 16 Glasses of wine per week    Comment: daily (1.5 liter bottle wine every 12)   Drug use: Not Currently    Types: Cocaine, Marijuana   Allergies Patient has no known allergies.  Review of Systems Review of Systems  Gastrointestinal:  Positive for abdominal pain.    Physical Exam Vital Signs  I have reviewed the triage vital signs BP 110/68   Pulse (!) 102   Temp 98.9 F (37.2 C) (Oral)   Resp 15   SpO2 98%   Physical Exam Vitals and nursing note reviewed.  Constitutional:      General: She is not in acute distress.    Appearance: She is well-developed.  HENT:     Head: Normocephalic and atraumatic.  Eyes:     Conjunctiva/sclera: Conjunctivae normal.  Cardiovascular:     Rate and Rhythm: Normal rate and regular rhythm.     Heart sounds: No murmur  heard. Pulmonary:     Effort: Pulmonary effort is normal. No respiratory distress.     Breath sounds: Normal breath sounds.  Abdominal:     Palpations: Abdomen is soft.     Tenderness: There is abdominal tenderness in the epigastric area.  Musculoskeletal:  General: No swelling.     Cervical back: Neck supple.  Skin:    General: Skin is warm and dry.     Capillary Refill: Capillary refill takes less than 2 seconds.  Neurological:     Mental Status: She is alert.  Psychiatric:        Mood and Affect: Mood normal.     ED Results and Treatments Labs (all labs ordered are listed, but only abnormal results are displayed) Labs Reviewed  CBC WITH DIFFERENTIAL/PLATELET - Abnormal; Notable for the following components:      Result Value   WBC 10.9 (*)    RBC 2.59 (*)    Hemoglobin 8.3 (*)    HCT 25.0 (*)    Platelets 635 (*)    All other components within normal limits  COMPREHENSIVE METABOLIC PANEL - Abnormal; Notable for the following components:   Potassium 3.4 (*)    Glucose, Bld 155 (*)    Creatinine, Ser 0.36 (*)    Calcium 8.4 (*)    Total Protein 5.6 (*)    Albumin 2.5 (*)    Alkaline Phosphatase 683 (*)    All other components within normal limits  LIPASE, BLOOD - Abnormal; Notable for the following components:   Lipase 54 (*)    All other components within normal limits  I-STAT CG4 LACTIC ACID, ED                                                                                                                          Radiology CT ABDOMEN PELVIS W CONTRAST Result Date: 01/19/2023 CLINICAL DATA:  62 year old female with history of pancreatic cancer. Abdominal pain. * Tracking Code: BO * EXAM: CT ABDOMEN AND PELVIS WITH CONTRAST TECHNIQUE: Multidetector CT imaging of the abdomen and pelvis was performed using the standard protocol following bolus administration of intravenous contrast. RADIATION DOSE REDUCTION: This exam was performed according to the departmental  dose-optimization program which includes automated exposure control, adjustment of the mA and/or kV according to patient size and/or use of iterative reconstruction technique. CONTRAST:  75mL OMNIPAQUE IOHEXOL 300 MG/ML  SOLN COMPARISON:  CT of the abdomen and pelvis 01/03/2023. FINDINGS: Lower chest: Scattered areas of bronchial wall thickening with thickening of the peribronchovascular interstitium and some peribronchovascular ground-glass attenuation and micro nodularity, most evident in the left lower lobe, similar to the prior study, likely reflecting sequela of recurrent aspiration. Small amount of pericardial fluid and/or thickening, similar to the prior study, unlikely to be of hemodynamic significance at this time. Hepatobiliary: Multiple well-circumscribed low-attenuation liver lesions are similar to prior studies. The largest of these are compatible with simple cysts, measuring up to 1.3 cm in segment 8 (axial image 10 of series 2). The smaller lesions are too small to definitively characterize, but likely to represent tiny cysts and/or biliary hamartomas. Gallbladder is completely decompressed with a percutaneous cholecystostomy tube in position in the fundus of the gallbladder. There continues to be severe intra and extrahepatic  biliary ductal dilatation, with abrupt cut off of the proximal common bile duct (coronal image 44 of series 7), proximal to which the common bile duct measures up to 16 mm in diameter. Pancreas: Poorly defined infiltrative hypovascular mass in the head of the pancreas with highly infiltrative appearance and poorly defined margins, estimated to measure approximately 6.0 x 5.1 x 7.1 cm on today's study (axial image 25 of series 2 and coronal image 51 of series 7). Body and tail of the pancreas are severely atrophic. 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 Notably, there is a large collateral pathway in the right-side of the upper abdomen which supplies flow to the portal vein which remains patent at this time. Additional cavernous transformation is noted in the porta hepatis and around the gallbladder fossa. Rim enhancing fluid collection adjacent to the body of the pancreas, extending anteriorly and superiorly making contact with the lesser curvature of the stomach (axial image 28 of series 2) measuring 6.1 x 4.9 cm, most compatible with a pancreatic pseudocyst. A smaller but similar appearing lesion is also noted adjacent to the pancreatic head (axial image 33 of series 2) measuring 3.4 x 1.9 cm. Spleen: Unremarkable. Adrenals/Urinary Tract: Bilateral kidneys and adrenal glands are normal in appearance. No hydroureteronephrosis. Urinary bladder is unremarkable in appearance. Stomach/Bowel: Interval placement of a stent traversing the pylorus. Stomach is moderately distended filled with gas and fluid. No pathologic dilatation of small bowel or colon. The appendix is not confidently identified and may be surgically absent. Regardless, there are no inflammatory changes noted adjacent to the cecum to suggest the presence of an acute appendicitis at this time. Vascular/Lymphatic: Vascular findings pertinent to pancreatic neoplasm, as above. Atherosclerosis in the abdominal aorta and pelvic vasculature, without definite aneurysm or dissection in the abdominal or pelvic vasculature. Multiple prominent borderline enlarged lymph nodes are noted in the central small bowel mesentery measuring up to 8 mm in short axis (axial image 38 of series 2). Reproductive: Uterus is heterogeneous in appearance with multiple coarsely calcified lesions most compatible  fibroids, largest of which measures 3.6 cm in the body of the uterus. Ovaries are atrophic. Other: Trace ascites.  No pneumoperitoneum. Musculoskeletal: There are no aggressive appearing lytic or blastic lesions noted in the visualized portions of the skeleton. IMPRESSION: 1. Poorly defined infiltrative hypovascular mass centered in the head of the pancreas, compatible with the reported pancreatic neoplasm. This is highly invasive involving multiple vascular structures, as detailed above. Overall, the mass is currently estimated to measure approximately 6.0 x 5.1 x 7.1 cm. 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 2. 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 3. Previously described pancreatic head mass causes obstruction of the common bile duct with severe intrahepatic biliary ductal dilatation. 4. Multiple small lesions in the liver grossly stable compared to prior examinations, favored to represent tiny cysts. 5. Gallbladder is completely decompressed with percutaneous cholecystostomy tube in position. 6. Interval placement of stent traversing the pylorus. 7. Aortic atherosclerosis. 8. Additional incidental findings, as above.  Electronically Signed   By: Trudie Reed M.D.   On: 01/19/2023 07:54    Pertinent labs & imaging results that were available during my care of the patient were reviewed by me and considered in my medical decision making (see MDM for details).  Medications Ordered in ED Medications  morphine (PF) 4 MG/ML injection 4 mg (4 mg Intravenous Given 01/19/23 0617)  ondansetron (ZOFRAN) injection 4 mg (4 mg Intravenous Given 01/19/23 0613)  iohexol (OMNIPAQUE) 300 MG/ML solution 75 mL (75 mLs Intravenous Contrast Given 01/19/23 0716)   morphine (PF) 4 MG/ML injection 4 mg (4 mg Intravenous Given 01/19/23 0857)  heparin lock flush 100 unit/mL (500 Units Intracatheter Given 01/19/23 0912)                                                                                                                                     Procedures Procedures  (including critical care time)  Medical Decision Making / ED Course   This patient presents to the ED for concern of abdominal pain, this involves an extensive number of treatment options, and is a complaint that carries with it a high risk of complications and morbidity.  The differential diagnosis includes GERD/gastritis, peptic ulcer disease, pancreatitis, gastroparesis, pneumonia, pleurisy, pericarditis, cholecystotomy complication, progressive malignancy  MDM: Patient seen emergency room for evaluation of abdominal pain.  Physical exam with epigastric tenderness to palpation but is otherwise unremarkable.  Laboratory evaluation with a hemoglobin of 10.9, hemoglobin 8.3, lipase 54, potassium 3.4, alk phos 683.  Lactic acid normal.  Patient pain controlled with morphine and on reevaluation symptoms significant improving.  Pending CT at time of signout.  Please see provider signout note for continuation of workup.   Additional history obtained: -Additional history obtained from husband -External records from outside source obtained and reviewed including: Chart review including previous notes, labs, imaging, consultation notes   Lab Tests: -I ordered, reviewed, and interpreted labs.   The pertinent results include:   Labs Reviewed  CBC WITH DIFFERENTIAL/PLATELET - Abnormal; Notable for the following components:      Result Value   WBC 10.9 (*)    RBC 2.59 (*)    Hemoglobin 8.3 (*)    HCT 25.0 (*)    Platelets 635 (*)    All other components within normal limits  COMPREHENSIVE METABOLIC PANEL - Abnormal; Notable for the following components:   Potassium 3.4 (*)    Glucose,  Bld 155 (*)    Creatinine, Ser 0.36 (*)    Calcium 8.4 (*)    Total Protein 5.6 (*)    Albumin 2.5 (*)    Alkaline Phosphatase 683 (*)    All other components within normal limits  LIPASE, BLOOD - Abnormal; Notable for the following components:   Lipase 54 (*)    All other components within normal limits  I-STAT CG4 LACTIC ACID, ED      Imaging Studies  ordered: I ordered imaging studies including CTAP and this is pending  Medicines ordered and prescription drug management: Meds ordered this encounter  Medications   morphine (PF) 4 MG/ML injection 4 mg   ondansetron (ZOFRAN) injection 4 mg   iohexol (OMNIPAQUE) 300 MG/ML solution 75 mL   morphine (PF) 4 MG/ML injection 4 mg   oxyCODONE (ROXICODONE) 5 MG immediate release tablet    Sig: Take 1 tablet (5 mg total) by mouth every 4 (four) hours as needed for severe pain (pain score 7-10) or moderate pain (pain score 4-6).    Dispense:  15 tablet    Refill:  0   heparin lock flush 100 unit/mL    -I have reviewed the patients home medicines and have made adjustments as needed  Critical interventions none   Cardiac Monitoring: The patient was maintained on a cardiac monitor.  I personally viewed and interpreted the cardiac monitored which showed an underlying rhythm of: NSR  Social Determinants of Health:  Factors impacting patients care include: none   Reevaluation: After the interventions noted above, I reevaluated the patient and found that they have :improved  Co morbidities that complicate the patient evaluation  Past Medical History:  Diagnosis Date   Alcohol abuse    Anxiety    Chickenpox    Depression    Neuromuscular disorder (HCC)    neuropathy   Pancreatic cancer (HCC)    Substance abuse (HCC)       Dispostion: I considered admission for this patient, and disposition pending completion of imaging studies.  Please see provider signout note continuation of workup     Final Clinical Impression(s) /  ED Diagnoses Final diagnoses:  Generalized abdominal pain     @PCDICTATION @    Gl[place]ora Score, MD 01/20/23 [phone]

## 2023-01-20 ENCOUNTER — Other Ambulatory Visit: Payer: Self-pay

## 2023-01-21 ENCOUNTER — Encounter: Payer: Self-pay | Admitting: Physician Assistant

## 2023-01-21 ENCOUNTER — Ambulatory Visit (INDEPENDENT_AMBULATORY_CARE_PROVIDER_SITE_OTHER): Payer: Medicaid Other | Admitting: Physician Assistant

## 2023-01-21 VITALS — BP 113/69 | HR 113 | Temp 97.4°F | Ht 63.0 in | Wt 96.8 lb

## 2023-01-21 DIAGNOSIS — C259 Malignant neoplasm of pancreas, unspecified: Secondary | ICD-10-CM

## 2023-01-21 DIAGNOSIS — R7989 Other specified abnormal findings of blood chemistry: Secondary | ICD-10-CM

## 2023-01-21 DIAGNOSIS — R634 Abnormal weight loss: Secondary | ICD-10-CM

## 2023-01-21 LAB — COMPREHENSIVE METABOLIC PANEL
ALT: 20 U/L (ref 0–35)
AST: 17 U/L (ref 0–37)
Albumin: 2.9 g/dL — ABNORMAL LOW (ref 3.5–5.2)
Alkaline Phosphatase: 730 U/L — ABNORMAL HIGH (ref 39–117)
BUN: 19 mg/dL (ref 6–23)
CO2: 25 meq/L (ref 19–32)
Calcium: 8 mg/dL — ABNORMAL LOW (ref 8.4–10.5)
Chloride: 106 meq/L (ref 96–112)
Creatinine, Ser: 0.4 mg/dL (ref 0.40–1.20)
GFR: 105.04 mL/min (ref >60.00)
Glucose, Bld: 121 mg/dL — ABNORMAL HIGH (ref 70–99)
Potassium: 3.5 meq/L (ref 3.5–5.1)
Sodium: 138 meq/L (ref 135–145)
Total Bilirubin: 0.4 mg/dL (ref 0.2–1.2)
Total Protein: 5.5 g/dL — ABNORMAL LOW (ref 6.0–8.3)

## 2023-01-21 LAB — CBC WITH DIFFERENTIAL/PLATELET
Basophils Absolute: 0.1 10*3/uL (ref 0.0–0.1)
Basophils Relative: 1.3 % (ref 0.0–3.0)
Eosinophils Absolute: 0.4 10*3/uL (ref 0.0–0.7)
Eosinophils Relative: 5.6 % — ABNORMAL HIGH (ref 0.0–5.0)
HCT: 24.5 % — ABNORMAL LOW (ref 36.0–46.0)
Hemoglobin: 8.3 g/dL — ABNORMAL LOW (ref 12.0–15.0)
Lymphocytes Relative: 20.8 % (ref 12.0–46.0)
Lymphs Abs: 1.6 10*3/uL (ref 0.7–4.0)
MCHC: 33.8 g/dL (ref 30.0–36.0)
MCV: 97.8 fL (ref 78.0–100.0)
Monocytes Absolute: 0.8 10*3/uL (ref 0.1–1.0)
Monocytes Relative: 9.7 % (ref 3.0–12.0)
Neutro Abs: 5 10*3/uL (ref 1.4–7.7)
Neutrophils Relative %: 62.6 % (ref 43.0–77.0)
Platelets: 688 10*3/uL — ABNORMAL HIGH (ref 150.0–400.0)
RBC: 2.5 Mil/uL — ABNORMAL LOW (ref 3.87–5.11)
RDW: 15.7 % — ABNORMAL HIGH (ref 11.5–15.5)
WBC: 7.9 10*3/uL (ref 4.0–10.5)

## 2023-01-21 LAB — T4, FREE: Free T4: 0.6 ng/dL (ref 0.60–1.60)

## 2023-01-21 LAB — T3, FREE: T3, Free: 2.8 pg/mL (ref 2.3–4.2)

## 2023-01-21 LAB — TSH: TSH: 4.49 u[IU]/mL (ref 0.35–5.50)

## 2023-01-21 NOTE — Patient Instructions (Signed)
VISIT SUMMARY:  During today's visit, we discussed your ongoing issues related to pancreatic cancer, including severe abdominal pain, significant weight loss, and concerns about your duodenal stent and gallbladder drain. We reviewed your current pain management and nutritional intake, and we have outlined a plan to address these concerns.  YOUR PLAN:  -PANCREATIC CANCER: Pancreatic cancer is a type of cancer that begins in the tissues of your pancreas. We will coordinate with your oncologist to establish a more effective pain management regimen, including both extended release and immediate release options for oxycodone. We will check in with you later today to confirm the plan.  -UNINTENTIONAL WEIGHT LOSS: Unintentional weight loss can occur due to various reasons, including a hypermetabolic state caused by cancer. We will order blood work to assess your thyroid function and other potential causes of your weight loss.  -DUODENAL STENT: A duodenal stent is a tube placed in the duodenum to help keep it open and improve your ability to eat. Your stent is permanent and has helped with early satiety and weight loss issues.  -GALLBLADDER DRAIN: A gallbladder drain is used to remove fluid from the gallbladder. We will coordinate with your oncologist and/or surgeon to determine the plan for your gallbladder drain.   INSTRUCTIONS:  We will check in with you later today to confirm the pain management plan. Please follow up with your oncologist as scheduled. Additionally, we will order blood work to assess your thyroid function and other potential causes of your weight loss.

## 2023-01-21 NOTE — Progress Notes (Signed)
Chief Complaint:  Kathryn Underwood is a 62 y.o. female who presents today for a TCM visit. She is here with her partner today.    Subjective:  HPI:  Summary of Hospital admission: Reason for admission: Hyponatremia, pancreatic cancer  Date of admission: 01/03/23 Date of discharge: 01/12/23 Date of Interactive contact: 01/13/23 Summary of Hospital course: Pt presented to ER with worsening n/v/ weakness, sodium was 119. CT showed gastric outlet obstruction and RLL PNA. WBCs of 31 at that time. Started on IV fluids and anbx. GI/onc/palliative care all consulted during admission. Pt completed 7 days anbx for sepsis possible aspiration PNA. Pt felt better and wanted to go home. HH services were arranged.   Interim history:  Reports 4/10 upper abdominal pain today, but states she just took a pain pill a few hours ago. She is very nervous about running out of pain medication and states nobody is currently managing it.  The patient also reports a significant weight loss, dropping from 102 pounds to 96 pounds within a week, despite eating well. The patient has been consuming high-calorie foods and drinks, including whole milk and nutritional supplements, in an attempt to gain weight. The patient also reports having normal bowel movements and no vomiting.  The patient has a duodenal stent in place for the pancreatic cancer, which has improved her ability to eat. However, the patient also has a gallbladder drain, which recently burst and required emergency attention. The patient expresses concern about the drain and is unsure about when it can be removed.       ROS: Ongoing upper abdominal pain (better today), weight loss, fatigue, weakness, otherwise a complete review of systems was negative.   PMH:  The following were reviewed and entered/updated in epic: Past Medical History:  Diagnosis Date   Alcohol abuse    Anxiety    Chickenpox    Depression    Neuromuscular disorder (HCC)     neuropathy   Pancreatic cancer (HCC)    Substance abuse (HCC)    Patient Active Problem List   Diagnosis Date Noted   Overlapping malignant neoplasm of pancreas (HCC) 01/04/2023   Failure to thrive (child) 01/04/2023   Gastric outlet obstruction 01/04/2023   Hyponatremia 01/03/2023   Dehydration 12/28/2022   SIRS (systemic inflammatory response syndrome) (HCC) 12/24/2022   Abdominal pain 12/23/2022   Pancreatic adenocarcinoma (HCC) 12/16/2022   Biliary obstruction 12/15/2022   Partial gastric outlet obstruction 12/15/2022   High serum carbohydrate antigen 19-9 (CA19-9) 12/12/2022   Pancreatic cyst 12/12/2022   Abnormal CT scan, gastrointestinal tract 12/11/2022   Acute esophagitis 12/10/2022   Acute on chronic pancreatitis (HCC) 12/09/2022   Protein malnutrition (HCC) 12/09/2022   Unintentional weight loss 12/09/2022   Esophageal dysphagia 12/09/2022   Dilation of pancreatic duct 12/09/2022   Protein-calorie malnutrition, severe 12/09/2022   Pancreatic mass 12/08/2022   Multifocal atrial tachycardia (HCC) 08/31/2021   Delirium tremens (HCC) 07/27/2021   Pancreas hemorrhage 07/27/2021   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   Uterine fibroid 07/27/2021   COPD (chronic obstructive pulmonary disease) (HCC) 07/27/2021   Insomnia 07/27/2021   Pulmonary nodule 07/25/2021   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/25/2021   Alcohol induced acute pancreatitis without necrosis or infection 10/05/2017   Alcoholic ketoacidosis 10/04/2017   Epigastric pain 10/04/2017   MDD (major depressive disorder), recurrent severe, 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   Alcohol withdrawal (HCC) 04/25/2016   Sinus tachycardia 04/25/2016   Transaminitis 04/25/2016   Leukocytosis 04/25/2016   Closed  nondisplaced fracture of fifth right metatarsal bone 04/08/2016   Acute pain of right shoulder 04/08/2016   Vitamin D deficiency 12/11/2015   Alcohol dependence with unspecified alcohol-induced disorder (HCC)  12/09/2015   Fatigue 12/09/2015   Encounter for alcohol abuse counseling and surveillance 12/09/2015   Cough 01/04/2015   Tobacco abuse 01/04/2015   Depression with anxiety 01/04/2015   Past Surgical History:  Procedure Laterality Date   BIOPSY  12/10/2022   Procedure: BIOPSY;  Surgeon: Lemar Lofty., MD;  Location: [facility];  Service: Gastroenterology;;   CESAREAN SECTION     DUODENAL STENT PLACEMENT N/A 01/06/2023   Procedure: DUODENAL STENT PLACEMENT;  Surgeon: Lemar Lofty., MD;  Location: [facility];  Service: Gastroenterology;  Laterality: N/A;   ESOPHAGOGASTRODUODENOSCOPY N/A 08/14/2021   Procedure: ESOPHAGOGASTRODUODENOSCOPY (EGD);  Surgeon: Rachael Fee, MD;  Location: [facility];  Service: Gastroenterology;  Laterality: N/A;   ESOPHAGOGASTRODUODENOSCOPY N/A 12/12/2022   Procedure: ESOPHAGOGASTRODUODENOSCOPY (EGD);  Surgeon: Lemar Lofty., MD;  Location: [facility];  Service: Gastroenterology;  Laterality: N/A;   ESOPHAGOGASTRODUODENOSCOPY (EGD) WITH PROPOFOL N/A 12/10/2022   Procedure: ESOPHAGOGASTRODUODENOSCOPY (EGD) WITH PROPOFOL;  Surgeon: Meridee Score Netty Starring., MD;  Location: [facility];  Service: Gastroenterology;  Laterality: N/A;   ESOPHAGOGASTRODUODENOSCOPY (EGD) WITH PROPOFOL N/A 01/06/2023   Procedure: ESOPHAGOGASTRODUODENOSCOPY (EGD) WITH PROPOFOL;  Surgeon: Meridee Score Netty Starring., MD;  Location: [facility];  Service: Gastroenterology;  Laterality: N/A;  with duodenal stent placement   EUS N/A 08/14/2021   Procedure: UPPER ENDOSCOPIC ULTRASOUND (EUS) RADIAL;  Surgeon: Rachael Fee, MD;  Location: [facility];  Service: Gastroenterology;  Laterality: N/A;   EUS N/A 12/12/2022   Procedure: UPPER ENDOSCOPIC ULTRASOUND (EUS) LINEAR;  Surgeon: Lemar Lofty., MD;  Location: [facility];  Service: Gastroenterology;  Laterality: N/A;   EUS N/A 01/06/2023   Procedure: FULL UPPER ENDOSCOPIC ULTRASOUND (EUS) RADIAL;   Surgeon: Meridee Score Netty Starring., MD;  Location: [facility];  Service: Gastroenterology;  Laterality: N/A;   FINE NEEDLE ASPIRATION N/A 08/14/2021   Procedure: FINE NEEDLE ASPIRATION (FNA) LINEAR;  Surgeon: Rachael Fee, MD;  Location: [facility];  Service: Gastroenterology;  Laterality: N/A;   FINE NEEDLE ASPIRATION N/A 12/12/2022   Procedure: FINE NEEDLE ASPIRATION (FNA) LINEAR;  Surgeon: Lemar Lofty., MD;  Location: [facility];  Service: Gastroenterology;  Laterality: N/A;   FINE NEEDLE ASPIRATION  01/06/2023   Procedure: FINE NEEDLE ASPIRATION;  Surgeon: Meridee Score, Netty Starring., MD;  Location: [facility];  Service: Gastroenterology;;   IR IMAGING GUIDED PORT INSERTION  12/18/2022   IR PERC CHOLECYSTOSTOMY  12/13/2022   TONSILLECTOMY      Family History  Problem Relation Age of Onset   Dementia Mother    Arthritis Father    Asthma Father    Asthma Brother    Diabetes Maternal Grandmother    Dementia Maternal Grandmother    Diabetes Paternal Grandmother    Parkinson's disease Maternal Grandfather    Parkinson's disease Paternal Grandfather    Breast cancer Neg Hx    Colon cancer Neg Hx     Medications- Reconciled discharge and current medications in Epic.  Current Outpatient Medications  Medication Sig Dispense Refill   acetaminophen (TYLENOL) 500 MG tablet Take 500-1,000 mg by mouth every 6 (six) hours as needed for moderate pain (pain score 4-6).     Ensure Max Protein (ENSURE MAX PROTEIN) LIQD Take 330 mLs (11 oz total) by mouth 2 (two) times daily.     gabapentin (NEURONTIN) 300 MG capsule Take 2  capsules (600 mg total) by mouth 2 (two) times daily. (Patient taking differently: Take 900 mg by mouth at bedtime.) 60 capsule 0   lidocaine-prilocaine (EMLA) cream Apply to affected area once 30 g 3   mirtazapine (REMERON) 45 MG tablet Take 45 mg by mouth at bedtime.     ondansetron (ZOFRAN) 8 MG tablet Take 1 tablet (8 mg total) by mouth every 8 (eight)  hours as needed for nausea or vomiting. 30 tablet 1   pantoprazole (PROTONIX) 40 MG tablet Take 1 tablet (40 mg total) by mouth 2 (two) times daily. 180 tablet 0   prochlorperazine (COMPAZINE) 10 MG tablet Take 1 tablet (10 mg total) by mouth every 6 (six) hours as needed for nausea or vomiting. 30 tablet 1   QUEtiapine (SEROQUEL) 100 MG tablet Take 200 mg by mouth at bedtime.     sodium chloride flush (NS) 0.9 % SOLN Use 5 - 10 mLs to flush abdominal drain once daily as directed 300 mL 3   sucralfate (CARAFATE) 1 GM/10ML suspension TAKE 10 MLS (1 G TOTAL) BY MOUTH 4 (FOUR) TIMES DAILY - WITH MEALS AND AT BEDTIME. 420 mL 1   Vitamin D, Ergocalciferol, (DRISDOL) 1.25 MG (50000 UNIT) CAPS capsule Take 1 capsule (50,000 Units total) by mouth every 7 (seven) days. 5 capsule 6   apixaban (ELIQUIS) 5 MG TABS tablet Take 1 tablet (5 mg total) by mouth 2 (two) times daily. (Patient not taking: Reported on 01/21/2023) 60 tablet 0   oxyCODONE (OXY IR/ROXICODONE) 5 MG immediate release tablet Take 1 tablet (5 mg total) by mouth every 4 (four) hours as needed for moderate pain (pain score 4-6). (Patient not taking: Reported on 01/21/2023) 30 tablet 0   oxyCODONE (ROXICODONE) 5 MG immediate release tablet Take 1 tablet (5 mg total) by mouth every 4 (four) hours as needed for severe pain (pain score 7-10) or moderate pain (pain score 4-6). (Patient not taking: Reported on 01/21/2023) 15 tablet 0   No current facility-administered medications for this visit.    Allergies-reviewed and updated No Known Allergies  Social History   Socioeconomic History   Marital status: Divorced    Spouse name: Not on file   Number of children: Not on file   Years of education: Not on file   Highest education level: Not on file  Occupational History   Not on file  Tobacco Use   Smoking status: Some Days    Current packs/day: 0.50    Types: Cigarettes   Smokeless tobacco: Never  Vaping Use   Vaping status: Never Used   Substance and Sexual Activity   Alcohol use: Not Currently    Alcohol/week: 16.0 standard drinks of alcohol    Types: 16 Glasses of wine per week    Comment: daily (1.5 liter bottle wine every 12)   Drug use: Not Currently    Types: Cocaine, Marijuana   Sexual activity: Yes    Partners: Male    Comment: female partner  Other Topics Concern   Not on file  Social History Narrative   Divorced. Homemaker. HS grad.    Lives with life partner (stan)   Drinks caffeine.   Wears seatbelt. Smoke dectector in the home. No firearms in the home.    Feels safe in her relationships.     Social Drivers of Corporate investment banker Strain: Not on file  Food Insecurity: No Food Insecurity (01/03/2023)   Hunger Vital Sign    Worried About Running  Out of Food in the Last Year: Never true    Ran Out of Food in the Last Year: Never true  Transportation Needs: No Transportation Needs (01/03/2023)   PRAPARE - Administrator, Civil Service (Medical): No    Lack of Transportation (Non-Medical): No  Physical Activity: Not on file  Stress: Not on file  Social Connections: Not on file        Objective:  Physical Exam: BP 113/69   Pulse (!) 113   Temp (!) 97.4 F (36.3 C) (Temporal)   Ht 5\' 3"  (1.6 m)   Wt 96 lb 12.8 oz (43.9 kg)   SpO2 96%   BMI 17.15 kg/m   Gen: NAD, resting comfortably, gaunt appearance CV: Sinus tachycardia with no murmurs appreciated Pulm: NWOB, CTAB with no crackles, wheezes, or rhonchi GI: Normal bowel sounds present. Soft, Nontender, Protuberant abdomen. Drain present.  MSK: No edema, cyanosis, or clubbing noted; diffuse muscle wasting  Skin: Warm, dry; R chest large discolored scabbing nevus; bottom shows dried macular post shingles lesions Neuro: Grossly normal, moves all extremities Psych: Normal affect and thought content  Assessment/Plan:  Assessment and Plan    Pancreatic Cancer Severe abdominal and back pain, likely secondary to the pancreatic  cancer. Pain was managed with morphine in the hospital, but patient was discharged without adequate pain management. Currently taking oxycodone 5 mg IR, but running out (has 5 pills left as of today). -Coordinate with oncologist to establish a pain management regimen, considering both extended release and immediate release options for oxycodone. -Check in with patient later today to confirm plan.  Unintentional Weight Loss Significant weight loss despite good appetite and food intake. Possible hypermetabolic state due to cancer. -Order blood work to assess thyroid function and other potential causes of weight loss.  Duodenal Stent Placed for pancreatic cancer, has improved early satiety and weight loss issues. Confirmed with surgeon that stent is permanent.  Gallbladder Drain Still in place, duration unknown. Patient is managing well with it at home. -Coordinate with oncologist and/or surgeon to determine plan for gallbladder drain.  General Health Maintenance -Continue with home health visits for strength training and assistance with personal care. -Follow up with oncologist on 02/17/2023.        Britanni Yarde, PA-C    Time Spent: 45 minutes of total time was spent on the date of the encounter performing the following actions: chart review prior to seeing the patient, obtaining history, performing a medically necessary exam, counseling on the treatment plan, placing orders, and documenting in our EHR.

## 2023-01-22 ENCOUNTER — Other Ambulatory Visit: Payer: Self-pay

## 2023-01-22 ENCOUNTER — Other Ambulatory Visit: Payer: Self-pay | Admitting: *Deleted

## 2023-01-22 ENCOUNTER — Telehealth: Payer: Self-pay

## 2023-01-22 MED ORDER — OXYCODONE HCL 5 MG PO TABS: 5.0000 mg | ORAL_TABLET | ORAL | 0 refills | Status: DC | PRN

## 2023-01-22 NOTE — Telephone Encounter (Signed)
Her primary care messaged me earlier. I told her I did not feel comfortable filling because I had never seen her and this prescription was initially given during ED visit. I see this patient Monday and will fill then. Her Primary said they would fill until Monday.

## 2023-01-22 NOTE — Telephone Encounter (Signed)
Copied from CRM [id number]. Topic: Clinical - Medication Refill >> Jan 22, 2023  8:57 AM Kathryn Underwood wrote: Most Recent Primary Care Visit:  Provider: Bary Leriche  Department: [facility]  Visit Type: HOSPITAL FOLLOW UP  Date: 01/21/2023  Medication: oxyCODONE (OXY IR/ROXICODONE) 5 MG immediate release tablet   Has the patient contacted their pharmacy? Yes (Agent: If no, request that the patient contact the pharmacy for the refill. If patient does not wish to contact the pharmacy document the reason why and proceed with request.) (Agent: If yes, when and what did the pharmacy advise?)  Patient original prescription was written from hospital for 3 days for sever pain. Patient stated that she was advised by provider that she would refill the prescription for her due [place]. Patient is out of medication and needs this refilled.   Is this the correct pharmacy for this prescription? Yes If no, delete pharmacy and type the correct one.  This is the patient's preferred pharmacy:  [id number] - [place], [place] - [address] [place] [address] Phone: [phone] Fax: [phone]   Has the prescription been filled recently? Yes  Is the patient out of the medication? Yes  Has the patient been seen for an appointment in the last year OR does the patient have an upcoming appointment? Yes  Can we respond through MyChart? Yes  Agent: Please be advised that Rx refills may take up to 3 business days. We ask that you follow-up with your pharmacy.    Spoke with nurse on 01/20/2026 Stated Rx will be refilled by provider assistant

## 2023-01-22 NOTE — Telephone Encounter (Signed)
Secure chat with Herbert Seta today- she has not met patient yet. She will be seeing her on 12/30 with plans for oncology to take over management of her cancer pain. I will refill this medication to get her through the weekend to that appointment.

## 2023-01-22 NOTE — Telephone Encounter (Signed)
Do they want me to fill this for her? I am very unclear. She did see her PCP yesterday. This was written during ED visit, so I'm not sure who said they would fill it. If cancer center, I will take care of it. I see her back 12/30

## 2023-01-22 NOTE — Telephone Encounter (Signed)
See note

## 2023-01-22 NOTE — Telephone Encounter (Signed)
Pt called requesting if a refill for Oxycodone could be sent to her preferred pharmacy.  Stated Dr. Mosetta Putt is currently out of the office but this nurse will ask someone from Team regarding the refill request.

## 2023-01-25 ENCOUNTER — Inpatient Hospital Stay: Payer: Medicaid Other

## 2023-01-25 ENCOUNTER — Telehealth: Payer: Self-pay

## 2023-01-25 ENCOUNTER — Other Ambulatory Visit: Payer: Self-pay

## 2023-01-25 ENCOUNTER — Inpatient Hospital Stay (HOSPITAL_BASED_OUTPATIENT_CLINIC_OR_DEPARTMENT_OTHER): Payer: Medicaid Other | Admitting: Nurse Practitioner

## 2023-01-25 VITALS — BP 113/63 | HR 114 | Temp 98.6°F | Resp 18 | Wt 101.7 lb

## 2023-01-25 DIAGNOSIS — E860 Dehydration: Secondary | ICD-10-CM

## 2023-01-25 DIAGNOSIS — C259 Malignant neoplasm of pancreas, unspecified: Secondary | ICD-10-CM

## 2023-01-25 DIAGNOSIS — C258 Malignant neoplasm of overlapping sites of pancreas: Secondary | ICD-10-CM | POA: Diagnosis present

## 2023-01-25 DIAGNOSIS — D649 Anemia, unspecified: Secondary | ICD-10-CM | POA: Diagnosis not present

## 2023-01-25 DIAGNOSIS — Z5111 Encounter for antineoplastic chemotherapy: Secondary | ICD-10-CM | POA: Diagnosis present

## 2023-01-25 LAB — CBC WITH DIFFERENTIAL (CANCER CENTER ONLY)
Abs Immature Granulocytes: 0.03 10*3/uL (ref 0.00–0.07)
Basophils Absolute: 0.1 10*3/uL (ref 0.0–0.1)
Basophils Relative: 1 %
Eosinophils Absolute: 0.5 10*3/uL (ref 0.0–0.5)
Eosinophils Relative: 5 %
HCT: 23.4 % — ABNORMAL LOW (ref 36.0–46.0)
Hemoglobin: 7.8 g/dL — ABNORMAL LOW (ref 12.0–15.0)
Immature Granulocytes: 0 %
Lymphocytes Relative: 18 %
Lymphs Abs: 1.8 10*3/uL (ref 0.7–4.0)
MCH: 31.5 pg (ref 26.0–34.0)
MCHC: 33.3 g/dL (ref 30.0–36.0)
MCV: 94.4 fL (ref 80.0–100.0)
Monocytes Absolute: 0.9 10*3/uL (ref 0.1–1.0)
Monocytes Relative: 10 %
Neutro Abs: 6.6 10*3/uL (ref 1.7–7.7)
Neutrophils Relative %: 66 %
Platelet Count: 729 10*3/uL — ABNORMAL HIGH (ref 150–400)
RBC: 2.48 MIL/uL — ABNORMAL LOW (ref 3.87–5.11)
RDW: 15.3 % (ref 11.5–15.5)
WBC Count: 9.9 10*3/uL (ref 4.0–10.5)
nRBC: 0 % (ref 0.0–0.2)

## 2023-01-25 LAB — CMP (CANCER CENTER ONLY)
ALT: 43 U/L (ref 0–44)
AST: 30 U/L (ref 15–41)
Albumin: 2.9 g/dL — ABNORMAL LOW (ref 3.5–5.0)
Alkaline Phosphatase: 741 U/L — ABNORMAL HIGH (ref 38–126)
Anion gap: 6 (ref 5–15)
BUN: 16 mg/dL (ref 8–23)
CO2: 28 mmol/L (ref 22–32)
Calcium: 8.3 mg/dL — ABNORMAL LOW (ref 8.9–10.3)
Chloride: 103 mmol/L (ref 98–111)
Creatinine: 0.33 mg/dL — ABNORMAL LOW (ref 0.44–1.00)
GFR, Estimated: >60 mL/min (ref >60)
Glucose, Bld: 165 mg/dL — ABNORMAL HIGH (ref 70–99)
Potassium: 3.7 mmol/L (ref 3.5–5.1)
Sodium: 137 mmol/L (ref 135–145)
Total Bilirubin: 0.5 mg/dL (ref 0.0–1.2)
Total Protein: 5.5 g/dL — ABNORMAL LOW (ref 6.5–8.1)

## 2023-01-25 MED ORDER — OXYCODONE HCL 5 MG PO TABS: 5.0000 mg | ORAL_TABLET | ORAL | 0 refills | Status: DC | PRN

## 2023-01-25 MED ORDER — HEPARIN SOD (PORK) LOCK FLUSH 100 UNIT/ML IV SOLN
500.0000 [IU] | Freq: Once | INTRAVENOUS | Status: AC | PRN
  Administered 2023-01-25: 500 [IU]

## 2023-01-25 MED ORDER — SODIUM CHLORIDE 0.9% FLUSH
10.0000 mL | Freq: Once | INTRAVENOUS | Status: AC | PRN
Start: 2023-01-25 — End: 2023-01-25
  Administered 2023-01-25: 10 mL

## 2023-01-25 NOTE — Progress Notes (Signed)
Patient Care Team: Allwardt, Crist Infante, PA-C as PCP - General (Physician Assistant) Geraldine Contras, MD as Referring Physician (Psychiatry) Malachy Mood, MD as Consulting Physician (Hematology and Oncology)  Clinic Day:  01/26/2023  Referring physician: Malachy Mood, MD  ASSESSMENT & PLAN:   Assessment & Plan: Pancreatic adenocarcinoma [facility]) -[id number] -Diagnosed in November 2024 -She presented with recurrent abdominal pain, nausea, pancreatitis to hospital -Abdominal CT and MRI showed pancreatitis, large mass in the neck and head of pancreas with involvement of SMA and SMV, biopsy confirmed adenocarcinoma. -She was readmitted to hospital for recurrent nausea and vomiting, likely partial gastric outlet obstruction from pancreatic tumor.  She is able to tolerate full liquid diet  -Plan to start chemotherapy soon   Plan: Labs reviewed  -CBC showing WBC 9.9; Hgb 7.8; Hct 23.4; Plt 729; Anc 6.6 -CMP - K 3.7; glucose 165; BUN 16; Creatinine 0.33; eGFR >60; Ca 9.9; AST 30; ALT 43; AlkPhos 741 Will add blood transfusion of 1 unit of PRBCs with tomorrows treatment. Patient is agreeable. Will come early for additional labs.  -add B12, ferritin, iron and TIBC, type and screen, and ABO/rH Scheduled to have IR cholangiogram on 01/29/2023 with existing drain tube. She should keep this appointment as scheduled.  Ok to proceed with treatment Gemzar and Abraxane tomorrow, 02/26/2023   Refill oxycodone 5 mg with #60 tablets.  Will reassess pain at next visit. Follow up with IR 01/29/2023 and then labs/flush, follow up, and treatment as scheduled with Dr. Mosetta Putt 02/08/2023.   The patient understands the plans discussed today and is in agreement with them.  She knows to contact our office if she develops concerns prior to her next appointment.  I provided 30 minutes of face-to-face time during this encounter and > 50% was spent counseling as documented under my assessment and plan.    [name], NP  [facility] - A DEPT OF [facility] [address] [place] [address] Dept: [phone] Dept Fax: [phone]   Orders Placed This Encounter  Procedures   CBC (Cancer Center Only)    Pre-Transfusion Labs    Standing Status:   Future    Number of Occurrences:   1    Expiration Date:   01/25/2024   Ferritin    Standing Status:   Future    Number of Occurrences:   1    Expected Date:   01/26/2023    Expiration Date:   01/25/2024   Iron and Iron Binding Capacity ([facility] only)    Standing Status:   Future    Number of Occurrences:   1    Expected Date:   01/26/2023    Expiration Date:   01/25/2024   Vitamin B12    Standing Status:   Future    Number of Occurrences:   1    Expected Date:   01/26/2023    Expiration Date:   01/25/2024   Informed Consent Details: Physician/Practitioner Attestation; Transcribe to consent form and obtain patient signature    Standing Status:   Future    Number of Occurrences:   1    Expected Date:   01/26/2023    Expiration Date:   01/25/2024    Physician/Practitioner attestation of informed consent for blood and or blood product transfusion:   I, the physician/practitioner, attest that I have discussed with the patient the benefits, risks, side effects, alternatives, likelihood of achieving goals and potential  problems during recovery for the procedure that I have provided informed consent.    Product(s):   All Product(s)   Type and screen         Standing Status:   Future    Number of Occurrences:   1    Expiration Date:   01/25/2024      CHIEF COMPLAINT:  CC: pancreatic adenocarcinoma   Current Treatment:  abraxane and gemcitabine started 01/26/2023  INTERVAL HISTORY:  Kathryn Underwood is here today for repeat clinical assessment. She was last seen by Dr. Mosetta Putt on 12/28/2022. She is to be a new start of abrazane and Gemzar on 01/26/2023. She has considerable abdominal pain which  radiates around to her back. 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 She currently takes eliquis due to portal vein thrombosis. She has been to the ED twice over the past month. Most recent visit was 01/19/2023 due to abdominal pain. Reports generalized joint and bone pain.  She was discharged home with oxycodone to manage her pain. She states this is the only thing that has successfully managed her pain thus far. She asks that this be refilled today.   She denies fevers or chills. Her appetite is poor. Her weight has decreased 1 pounds over last 2 weeks .  I have reviewed the past medical history, past surgical history, social history and family history with the patient and they are unchanged from previous note.  ALLERGIES:  has no known allergies.  MEDICATIONS:  Current Outpatient Medications  Medication Sig Dispense Refill   acetaminophen (TYLENOL) 500 MG tablet Take 500-1,000 mg by mouth every 6 (six) hours as needed for moderate pain (pain score 4-6).     apixaban (ELIQUIS) 5 MG TABS tablet Take 1 tablet (5 mg total) by mouth 2 (two) times daily. (Patient not taking: Reported on 01/21/2023) 60 tablet 0   Ensure Max Protein (ENSURE MAX PROTEIN) LIQD Take 330 mLs (11 oz total) by mouth 2 (two) times daily.     gabapentin (NEURONTIN) 300 MG capsule Take 2 capsules (600 mg total) by mouth 2 (two) times daily. (Patient taking differently: Take 900 mg by mouth at bedtime.) 60 capsule 0   lidocaine-prilocaine (EMLA) cream Apply to affected area once 30 g 3   mirtazapine (REMERON) 45 MG tablet Take 45 mg by mouth at bedtime.     ondansetron (ZOFRAN) 8 MG tablet Take 1 tablet (8 mg total) by mouth every 8 (eight) hours as needed for nausea or vomiting. 30 tablet 1   oxyCODONE (OXY IR/ROXICODONE) 5 MG immediate release tablet Take 1 tablet (5 mg total) by mouth every 4 (four) hours as needed for moderate pain (pain score 4-6). 50  tablet 0   pantoprazole (PROTONIX) 40 MG tablet Take 1 tablet (40 mg total) by mouth 2 (two) times daily. 180 tablet 0   prochlorperazine (COMPAZINE) 10 MG tablet Take 1 tablet (10 mg total) by mouth every 6 (six) hours as needed for nausea or vomiting. 30 tablet 1   QUEtiapine (SEROQUEL) 100 MG tablet Take 200 mg by mouth at bedtime.     sodium chloride flush (NS) 0.9 % SOLN Use 5 - 10 mLs to flush abdominal drain once daily as directed 300 mL 3   sucralfate (CARAFATE) 1 GM/10ML suspension TAKE 10 MLS (1 G TOTAL) BY MOUTH 4 (FOUR) TIMES DAILY - WITH MEALS AND AT BEDTIME. 420 mL 1   Vitamin D, Ergocalciferol, (DRISDOL) 1.25  MG (50000 UNIT) CAPS capsule Take 1 capsule (50,000 Units total) by mouth every 7 (seven) days. 5 capsule 6   No current facility-administered medications for this visit.   Facility-Administered Medications Ordered in Other Visits  Medication Dose Route Frequency Provider Last Rate Last Admin   0.9 %  sodium chloride infusion (Manually program via Guardrails IV Fluids)  250 mL Intravenous Continuous Brissia Delisa E, NP       0.9 %  sodium chloride infusion   Intravenous Continuous Malachy Mood, MD 10 mL/hr at 01/26/23 1152 New Bag at 01/26/23 1152   gemcitabine (GEMZAR) 1,140 mg in sodium chloride 0.9 % 250 mL chemo infusion  800 mg/m2 (Treatment Plan Recorded) Intravenous Once Malachy Mood, MD       heparin lock flush 100 unit/mL  250 Units Intracatheter PRN [name], NP       heparin lock flush 100 unit/mL  500 Units Intracatheter Once PRN Malachy Mood, MD       PACLitaxel-protein bound (ABRAXANE) chemo infusion 150 mg  100 mg/m2 (Treatment Plan Recorded) Intravenous Once Malachy Mood, MD       sodium chloride flush (NS) 0.9 % injection 10 mL  10 mL Intracatheter PRN Berkley Cronkright E, NP       sodium chloride flush (NS) 0.9 % injection 10 mL  10 mL Intracatheter PRN Malachy Mood, MD        HISTORY OF PRESENT ILLNESS:   Oncology History  Pancreatic adenocarcinoma (HCC)   12/12/2022 Cancer Staging   Staging form: Exocrine Pancreas, AJCC 8th Edition - Clinical stage from 12/12/2022: Stage IIA (cT3, cN0, cM0) - Signed by Malachy Mood, MD on 12/28/2022 Total positive nodes: 0   12/16/2022 Initial Diagnosis   Pancreatic adenocarcinoma (HCC)   01/26/2023 -  Chemotherapy   Patient is on Treatment Plan : PANCREATIC Abraxane D1,8,15 + Gemcitabine D1,8,15 q28d         REVIEW OF SYSTEMS:   Constitutional: Denies fevers, chills or abnormal weight loss.  Has generalized joint and bone pain. Eyes: Denies blurriness of vision Ears, nose, mouth, throat, and face: Denies mucositis or sore throat Respiratory: Denies cough, dyspnea or wheezes Cardiovascular: Denies palpitation, chest discomfort or lower extremity swelling Gastrointestinal:  Denies nausea, heartburn or change in bowel habits.  Severe abdominal pain which radiates around to her back.  Mom usually rate her pain between 8 and 9 out of 10.  Oxycodone has been the only thing that helps. Skin: Denies abnormal skin rashes Lymphatics: Denies new lymphadenopathy or easy bruising Neurological:Denies numbness, tingling or new weaknesses Behavioral/Psych: Mood is stable, no new changes  All other systems were reviewed with the patient and are negative.   VITALS:   Today's Vitals   01/25/23 1340 01/25/23 1349 01/25/23 1409  BP: 113/63    Pulse: (!) 124  (!) 114  Resp: 18    Temp: 98.6 F (37 C)    TempSrc: Temporal    SpO2: 100%    Weight: 101 lb 11.2 oz (46.1 kg)    PainSc:  5     Body mass index is 18.02 kg/m.   Wt Readings from Last 3 Encounters:  01/25/23 101 lb 11.2 oz (46.1 kg)  01/21/23 96 lb 12.8 oz (43.9 kg)  01/11/23 102 lb 9.6 oz (46.5 kg)    Body mass index is 18.02 kg/m.  Performance status (ECOG): 2 - Symptomatic, <50% confined to bed  PHYSICAL EXAM:   GENERAL:alert and oriented.  Appears uncomfortable. SKIN: skin color,  texture, turgor are normal, no rashes or significant  lesions EYES: normal, Conjunctiva are pink and non-injected, sclera clear OROPHARYNX:no exudate, no erythema and lips, buccal mucosa, and tongue normal  NECK: supple, thyroid normal size, non-tender, without nodularity LYMPH:  no palpable lymphadenopathy in the cervical, axillary or inguinal LUNGS: clear to auscultation and percussion with normal breathing effort.  Breath sounds are diminished throughout the lung fields. HEART: regular rate & rhythm and no murmurs and no lower extremity edema.  Mild tachycardia ABDOMEN: Tender with cholecystomy tube patent.  Musculoskeletal:no cyanosis of digits and no clubbing  NEURO: alert & oriented x 3 with fluent speech, no focal motor/sensory deficits  LABORATORY DATA:  I have reviewed the data as listed    Component Value Date/Time   NA 135 01/26/2023 1107   K 4.6 01/26/2023 1107   CL 101 01/26/2023 1107   CO2 29 01/26/2023 1107   GLUCOSE 103 (H) 01/26/2023 1107   BUN 16 01/26/2023 1107   CREATININE 0.34 (L) 01/26/2023 1107   CALCIUM 8.5 (L) 01/26/2023 1107   PROT 5.7 (L) 01/26/2023 1107   ALBUMIN 3.0 (L) 01/26/2023 1107   AST 28 01/26/2023 1107   ALT 42 01/26/2023 1107   ALKPHOS 729 (H) 01/26/2023 1107   BILITOT 0.5 01/26/2023 1107   GFRNONAA >60 01/26/2023 1107   GFRAA >60 09/20/2018 1217    Lab Results  Component Value Date   WBC 9.9 01/26/2023   NEUTROABS 6.6 01/25/2023   HGB 8.0 (L) 01/26/2023   HCT 23.6 (L) 01/26/2023   MCV 92.9 01/26/2023   PLT 804 (H) 01/26/2023    RADIOGRAPHIC STUDIES: CT ABDOMEN PELVIS W CONTRAST Result Date: 01/19/2023 CLINICAL DATA:  62 year old female with history of pancreatic cancer. Abdominal pain. * Tracking Code: BO * EXAM: CT ABDOMEN AND PELVIS WITH CONTRAST TECHNIQUE: Multidetector CT imaging of the abdomen and pelvis was performed using the standard protocol following bolus administration of intravenous contrast. RADIATION DOSE REDUCTION: This exam was performed according to the departmental  dose-optimization program which includes automated exposure control, adjustment of the mA and/or kV according to patient size and/or use of iterative reconstruction technique. CONTRAST:  75mL OMNIPAQUE IOHEXOL 300 MG/ML  SOLN COMPARISON:  CT of the abdomen and pelvis 01/03/2023. FINDINGS: Lower chest: Scattered areas of bronchial wall thickening with thickening of the peribronchovascular interstitium and some peribronchovascular ground-glass attenuation and micro nodularity, most evident in the left lower lobe, similar to the prior study, likely reflecting sequela of recurrent aspiration. Small amount of pericardial fluid and/or thickening, similar to the prior study, unlikely to be of hemodynamic significance at this time. Hepatobiliary: Multiple well-circumscribed low-attenuation liver lesions are similar to prior studies. The largest of these are compatible with simple cysts, measuring up to 1.3 cm in segment 8 (axial image 10 of series 2). The smaller lesions are too small to definitively characterize, but likely to represent tiny cysts and/or biliary hamartomas. Gallbladder is completely decompressed with a percutaneous cholecystostomy tube in position in the fundus of the gallbladder. There continues to be severe intra and extrahepatic biliary ductal dilatation, with abrupt cut off of the proximal common bile duct (coronal image 44 of series 7), proximal to which the common bile duct measures up to 16 mm in diameter. Pancreas: Poorly defined infiltrative hypovascular mass in the head of the pancreas with highly infiltrative appearance and poorly defined margins, estimated to measure approximately 6.0 x 5.1 x 7.1 cm on today's study (axial image 25 of series 2 and coronal image 51  of series 7). Body and tail of the pancreas are severely atrophic. 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 Notably, there is a large collateral pathway in the right-side of the upper abdomen which supplies flow to the portal vein which remains patent at this time. Additional cavernous transformation is noted in the porta hepatis and around the gallbladder fossa. Rim enhancing fluid collection adjacent to the body of the pancreas, extending anteriorly and superiorly making contact with the lesser curvature of the stomach (axial image 28 of series 2) measuring 6.1 x 4.9 cm, most compatible with a pancreatic pseudocyst. A smaller but similar appearing lesion is also noted adjacent to the pancreatic head (axial image 33 of series 2) measuring 3.4 x 1.9 cm. Spleen: Unremarkable. Adrenals/Urinary Tract: Bilateral kidneys and adrenal glands are normal in appearance. No hydroureteronephrosis. Urinary bladder is unremarkable in appearance. Stomach/Bowel: Interval placement of a stent traversing the pylorus. Stomach is moderately distended filled with gas and fluid. No pathologic dilatation of small bowel or colon. The appendix is not confidently identified and may be surgically absent. Regardless, there are no inflammatory changes noted adjacent to the cecum to suggest the presence of an acute appendicitis at this time. Vascular/Lymphatic: Vascular findings pertinent to pancreatic neoplasm, as above. Atherosclerosis in the abdominal aorta and pelvic vasculature, without definite aneurysm or dissection in the abdominal or pelvic vasculature. Multiple prominent borderline enlarged lymph nodes are noted in the central small bowel mesentery measuring up to 8 mm in short axis (axial image 38 of series 2). Reproductive: Uterus is heterogeneous in appearance with multiple coarsely calcified lesions most compatible  fibroids, largest of which measures 3.6 cm in the body of the uterus. Ovaries are atrophic. Other: Trace ascites.  No pneumoperitoneum. Musculoskeletal: There are no aggressive appearing lytic or blastic lesions noted in the visualized portions of the skeleton. IMPRESSION: 1. Poorly defined infiltrative hypovascular mass centered in the head of the pancreas, compatible with the reported pancreatic neoplasm. This is highly invasive involving multiple vascular structures, as detailed above. Overall, the mass is currently estimated to measure approximately 6.0 x 5.1 x 7.1 cm. 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 2. 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 3. Previously described pancreatic head mass causes obstruction of the common bile duct with severe intrahepatic biliary ductal dilatation. 4. Multiple small lesions in the liver grossly stable compared to prior examinations, favored to represent tiny cysts. 5. Gallbladder is completely decompressed with percutaneous cholecystostomy tube in position. 6. Interval placement of stent traversing the pylorus. 7. Aortic atherosclerosis. 8. Additional incidental findings, as above. Electronically Signed   By: Trudie Reed M.D.   On: 01/19/2023 07:54   DG ESOPHAGUS DILATION Result Date: 01/06/2023 ESOPHAGEAL DILATATION: Fluoroscopy was provided for use by the requesting physician.  No images were obtained for radiographic interpretation.  DG C-Arm 1-60 Min-No Report Result Date: 01/06/2023 Fluoroscopy was utilized by the requesting physician.  No radiographic interpretation.   DG Abd Portable 1V Result Date: 01/04/2023 CLINICAL DATA:  Nasogastric tube placement.  EXAM: PORTABLE ABDOMEN - 1 VIEW COMPARISON:   Portable chest dated 01/03/2023 and abdomen and pelvis CT dated 01/03/2023. FINDINGS: Nasogastric tube side hole just distal to the gastroesophageal junction and tip in the proximal to mid stomach. With left jugular catheter tip at the superior cavoatrial junction. Resolved left basilar atelectasis. The included bowel-gas pattern is unremarkable. IMPRESSION: Nasogastric tube tip in the proximal to mid stomach. Electronically Signed   By: Beckie Salts M.D.   On: 01/04/2023 17:29   CT ABDOMEN PELVIS WO CONTRAST Result Date: 01/03/2023 CLINICAL DATA:  Abdominal pain. Nausea vomiting for several days. Reported history of pancreatic carcinoma. EXAM: CT ABDOMEN AND PELVIS WITHOUT CONTRAST TECHNIQUE: Multidetector CT imaging of the abdomen and pelvis was performed following the standard protocol without IV contrast. RADIATION DOSE REDUCTION: This exam was performed according to the departmental dose-optimization program which includes automated exposure control, adjustment of the mA and/or kV according to patient size and/or use of iterative reconstruction technique. COMPARISON:  12/23/2022. FINDINGS: Lower chest: Ill-defined peribronchovascular ground-glass and more confluent opacities, most evident in the left lower lobe, new since the prior CT. No pleural effusion. Small pericardial effusion similar to the prior CT. Fluid mildly distends the distal esophagus. Hepatobiliary: Ill-defined hypoattenuation involving segments 4 and 5 as well as portions of segment 6, better defined on the prior contrast enhanced CT, consistent with metastatic disease. There also several more well-defined hypoattenuating liver masses there are stable. No new liver abnormalities. Pigtail drainage catheter lies along the inferior margin of the right lobe, unchanged. Pancreas: Heterogeneous mass with ill-defined margins arises from the pancreatic head with coarse central calcifications. This is unchanged from the prior CT. Fluid collection  abuts the right inferior pancreatic head and adjacent duodenum measuring 3.7 cm. Larger fluid collection abuts the proximal pancreatic tail, posterior to the stomach, 5.8 cm in size. These are similar to the prior CT suspected to be pseudocysts. There is some fat stranding adjacent to the pancreas, similar to the prior CT. Spleen: Normal. Adrenals/Urinary Tract: Mild bilateral adrenal gland thickening consistent with hyperplasia. Normal kidneys, ureters and bladder. Stomach/Bowel: Significant gastric distension. No stomach wall thickening. Duodenum broadly abuts the pancreatic head mass. Wall not defined separate from the mass on this unenhanced exam. Small bowel and colon are normal in caliber. No wall thickening or inflammation. Mild to moderate increase in the colonic stool burden similar to the prior CT. Vascular/Lymphatic: Aortic atherosclerotic calcifications. No defined enlarged lymph nodes. Reproductive: Partly calcified uterine fibroids.  No adnexal masses. Other: No ascites. Musculoskeletal: No fracture or acute finding.  No bone lesion. IMPRESSION: 1. Significant stomach distention. This is suspected to be due to partial gastric outlet obstruction at the level of the duodenum due to the large pancreatic mass. 2. Pancreatic mass, liver lesions and right upper quadrant drainage catheter are without significant change from the recent prior CT, better defined on the prior study due to the presence of intravenous contrast. 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 These are also unchanged from the recent prior CT. Electronically Signed   By: Amie Portland M.D.   On: 01/03/2023 19:09   DG Chest Portable 1 View Result Date: 01/03/2023 CLINICAL DATA:  Shortness of breath EXAM: PORTABLE CHEST 1 VIEW COMPARISON:  CT chest dated 12/17/2022 FINDINGS: Mild left lower lobe opacity, atelectasis versus pneumonia. Right lung is clear. No pleural effusion or pneumothorax. The  heart is normal in size. Left chest power port terminates in the upper right atrium. IMPRESSION:  Mild left lower lobe opacity, atelectasis versus pneumonia. Electronically Signed   By: Charline Bills M.D.   On: 01/03/2023 17:28

## 2023-01-25 NOTE — Telephone Encounter (Signed)
Copied from CRM [id number]. Topic: Clinical - Medication Question >> Jan 25, 2023  8:51 AM Isabell A wrote: Reason for CRM: Patient states she is starting chemo therapy & she is not seeing Dr.Fang her oncologist until 1/15. Patient states she was prescribed pain medication to last her three days, she is requesting a refill to last until her next appointment.  Please see patient phone message and advise/send in refill for patient

## 2023-01-25 NOTE — Telephone Encounter (Signed)
Called pt to advise and pt states was taken care of in her visit today with Oncology

## 2023-01-25 NOTE — Progress Notes (Signed)
Nutrition  Patient scheduled to see nutrition following NP visit today.  Patient ready to go home following NP and did not want to stay for nutrition visit.  Wanted to be seen during infusion.  Appointment rescheduled for 1/15 during infusion.    Rayme Bui B. Freida Busman, RD, LDN Registered Dietitian [phone]

## 2023-01-25 NOTE — Telephone Encounter (Signed)
Copied from CRM [id number]. Topic: Clinical - Medication Question >> Jan 25, 2023  8:51 AM Isabell A wrote: Reason for CRM: Patient states she is starting chemo therapy & she is not seeing Dr.Fang her oncologist until 1/15. Patient states she was prescribed pain medication to last her three days, she is requesting a refill to last until her next appointment.  Please see pt call and advise

## 2023-01-26 ENCOUNTER — Encounter: Payer: Self-pay | Admitting: Hematology

## 2023-01-26 ENCOUNTER — Encounter: Payer: Self-pay | Admitting: Nurse Practitioner

## 2023-01-26 ENCOUNTER — Inpatient Hospital Stay: Payer: Medicaid Other

## 2023-01-26 ENCOUNTER — Telehealth: Payer: Self-pay

## 2023-01-26 VITALS — BP 134/80 | HR 96 | Temp 98.3°F | Resp 16

## 2023-01-26 DIAGNOSIS — E860 Dehydration: Secondary | ICD-10-CM

## 2023-01-26 DIAGNOSIS — C259 Malignant neoplasm of pancreas, unspecified: Secondary | ICD-10-CM

## 2023-01-26 DIAGNOSIS — Z5111 Encounter for antineoplastic chemotherapy: Secondary | ICD-10-CM | POA: Diagnosis not present

## 2023-01-26 DIAGNOSIS — D649 Anemia, unspecified: Secondary | ICD-10-CM

## 2023-01-26 LAB — CBC (CANCER CENTER ONLY)
HCT: 23.6 % — ABNORMAL LOW (ref 36.0–46.0)
Hemoglobin: 8 g/dL — ABNORMAL LOW (ref 12.0–15.0)
MCH: 31.5 pg (ref 26.0–34.0)
MCHC: 33.9 g/dL (ref 30.0–36.0)
MCV: 92.9 fL (ref 80.0–100.0)
Platelet Count: 804 10*3/uL — ABNORMAL HIGH (ref 150–400)
RBC: 2.54 MIL/uL — ABNORMAL LOW (ref 3.87–5.11)
RDW: 15.3 % (ref 11.5–15.5)
WBC Count: 9.9 10*3/uL (ref 4.0–10.5)
nRBC: 0 % (ref 0.0–0.2)

## 2023-01-26 LAB — CMP (CANCER CENTER ONLY)
ALT: 42 U/L (ref 0–44)
AST: 28 U/L (ref 15–41)
Albumin: 3 g/dL — ABNORMAL LOW (ref 3.5–5.0)
Alkaline Phosphatase: 729 U/L — ABNORMAL HIGH (ref 38–126)
Anion gap: 5 (ref 5–15)
BUN: 16 mg/dL (ref 8–23)
CO2: 29 mmol/L (ref 22–32)
Calcium: 8.5 mg/dL — ABNORMAL LOW (ref 8.9–10.3)
Chloride: 101 mmol/L (ref 98–111)
Creatinine: 0.34 mg/dL — ABNORMAL LOW (ref 0.44–1.00)
GFR, Estimated: >60 mL/min (ref >60)
Glucose, Bld: 103 mg/dL — ABNORMAL HIGH (ref 70–99)
Potassium: 4.6 mmol/L (ref 3.5–5.1)
Sodium: 135 mmol/L (ref 135–145)
Total Bilirubin: 0.5 mg/dL (ref 0.0–1.2)
Total Protein: 5.7 g/dL — ABNORMAL LOW (ref 6.5–8.1)

## 2023-01-26 LAB — ABO/RH: ABO/RH(D): O POS

## 2023-01-26 LAB — IRON AND IRON BINDING CAPACITY (CC-WL,HP ONLY)
Iron: 19 ug/dL — ABNORMAL LOW (ref 28–170)
Saturation Ratios: 6 % — ABNORMAL LOW (ref 10.4–31.8)
TIBC: 340 ug/dL (ref 250–450)
UIBC: 321 ug/dL (ref 148–442)

## 2023-01-26 LAB — VITAMIN B12: Vitamin B-12: 339 pg/mL (ref 180–914)

## 2023-01-26 LAB — PREPARE RBC (CROSSMATCH)

## 2023-01-26 LAB — FERRITIN: Ferritin: 12 ng/mL (ref 11–307)

## 2023-01-26 MED ORDER — SODIUM CHLORIDE 0.9% IV SOLUTION: 250.0000 mL | INTRAVENOUS | Status: DC

## 2023-01-26 MED ORDER — SODIUM CHLORIDE 0.9% FLUSH
10.0000 mL | Freq: Once | INTRAVENOUS | Status: AC | PRN
  Administered 2023-01-26: 10 mL

## 2023-01-26 MED ORDER — HEPARIN SOD (PORK) LOCK FLUSH 100 UNIT/ML IV SOLN
500.0000 [IU] | Freq: Once | INTRAVENOUS | Status: AC | PRN
  Administered 2023-01-26: 500 [IU]

## 2023-01-26 MED ORDER — HEPARIN SOD (PORK) LOCK FLUSH 100 UNIT/ML IV SOLN: 250.0000 [IU] | INTRAVENOUS | Status: DC | PRN

## 2023-01-26 MED ORDER — PROCHLORPERAZINE MALEATE 10 MG PO TABS
10.0000 mg | ORAL_TABLET | Freq: Once | ORAL | Status: AC
  Administered 2023-01-26: 10 mg via ORAL
  Filled 2023-01-26: qty 1

## 2023-01-26 MED ORDER — PACLITAXEL PROTEIN-BOUND CHEMO INJECTION 100 MG
100.0000 mg/m2 | Freq: Once | INTRAVENOUS | Status: AC
  Administered 2023-01-26: 150 mg via INTRAVENOUS
  Filled 2023-01-26: qty 30

## 2023-01-26 MED ORDER — SODIUM CHLORIDE 0.9 % IV SOLN
800.0000 mg/m2 | Freq: Once | INTRAVENOUS | Status: AC
  Administered 2023-01-26: 1140 mg via INTRAVENOUS
  Filled 2023-01-26: qty 29.98

## 2023-01-26 MED ORDER — SODIUM CHLORIDE 0.9% FLUSH
10.0000 mL | INTRAVENOUS | Status: DC | PRN
  Administered 2023-01-26: 10 mL

## 2023-01-26 MED ORDER — SODIUM CHLORIDE 0.9% FLUSH: 10.0000 mL | INTRAVENOUS | Status: DC | PRN

## 2023-01-26 MED ORDER — SODIUM CHLORIDE 0.9 % IV SOLN
INTRAVENOUS | Status: DC
Start: 2023-01-26 — End: 2023-01-26

## 2023-01-26 NOTE — Telephone Encounter (Signed)
 Copied from CRM [id number]. Topic: Clinical - Home Health Verbal Orders >> Jan 26, 2023 11:08 AM Isabell A wrote: Caller/Agency: Patrcia Ronn Rushing Number: [phone] Service Requested: Physical Therapy Frequency: Approve physical therapy for once a week for four weeks, nurse evaluation for chronic condition management & medication management  Any new concerns about the patient? Yes, just started chemo therapy & she is anemic. Potentially having mood problems.   Nurse can't go out until approved, please call back as soon as possible.  Returned call to [facility] with approval of verbal orders for patient for PT and nurse evaluation per PCP approval. Nothing further needed at this time

## 2023-01-26 NOTE — Patient Instructions (Signed)
 [facility] - A DEPT OF [facility]  Discharge Instructions: Thank you for choosing [facility] to provide your oncology and hematology care.   If you have a lab appointment with the Cancer Center, please go directly to the Cancer Center and check in at the registration area.   Wear comfortable clothing and clothing appropriate for easy access to any Portacath or PICC line.   We strive to give you quality time with your provider. You may need to reschedule your appointment if you arrive late (15 or more minutes).  Arriving late affects you and other patients whose appointments are after yours.  Also, if you miss three or more appointments without notifying the office, you may be dismissed from the clinic at the provider's discretion.      For prescription refill requests, have your pharmacy contact our office and allow 72 hours for refills to be completed.    Today you received the following chemotherapy and/or immunotherapy agents: Abraxane, Gemzar      To help prevent nausea and vomiting after your treatment, we encourage you to take your nausea medication as directed.  BELOW ARE SYMPTOMS THAT SHOULD BE REPORTED IMMEDIATELY: *FEVER GREATER THAN 100.4 F (38 C) OR HIGHER *CHILLS OR SWEATING *NAUSEA AND VOMITING THAT IS NOT CONTROLLED WITH YOUR NAUSEA MEDICATION *UNUSUAL SHORTNESS OF BREATH *UNUSUAL BRUISING OR BLEEDING *URINARY PROBLEMS (pain or burning when urinating, or frequent urination) *BOWEL PROBLEMS (unusual diarrhea, constipation, pain near the anus) TENDERNESS IN MOUTH AND THROAT WITH OR WITHOUT PRESENCE OF ULCERS (sore throat, sores in mouth, or a toothache) UNUSUAL RASH, SWELLING OR PAIN  UNUSUAL VAGINAL DISCHARGE OR ITCHING   Items with * indicate a potential emergency and should be followed up as soon as possible or go to the Emergency Department if any problems should occur.  Please show the CHEMOTHERAPY ALERT CARD or  IMMUNOTHERAPY ALERT CARD at check-in to the Emergency Department and triage nurse.  Should you have questions after your visit or need to cancel or reschedule your appointment, please contact [facility] - A DEPT OF [facility]  Dept: [phone]  and follow the prompts.  Office hours are 8:00 a.m. to 4:30 p.m. Monday - Friday. Please note that voicemails left after 4:00 p.m. may not be returned until the following business day.  We are closed weekends and major holidays. You have access to a nurse at all times for urgent questions. Please call the main number to the clinic Dept: [phone] and follow the prompts.   For any non-urgent questions, you may also contact your provider using MyChart. We now offer e-Visits for anyone 46 and older to request care online for non-urgent symptoms. For details visit [email or web address].   Also download the MyChart app! Go to the app store, search "MyChart", open the app, select [facility], and log in with your MyChart username and password.  Paclitaxel Nanoparticle Albumin-Bound Injection What is this medication? NANOPARTICLE ALBUMIN-BOUND PACLITAXEL (Na no PAHR ti kuhl al BYOO muhn-bound PAK li TAX el) treats some types of cancer. It works by slowing down the growth of cancer cells. This medicine may be used for other purposes; ask your health care provider or pharmacist if you have questions. COMMON BRAND NAME(S): Abraxane What should I tell my care team before I take this medication? They need to know if you have any of these conditions: Liver disease Low white blood cell levels  An unusual or allergic reaction to paclitaxel, albumin, other medications, foods, dyes, or preservatives If you or your partner are pregnant or trying to get pregnant Breast-feeding How should I use this medication? This medication is injected into a vein. It is given by your care team in a hospital or clinic setting. Talk to your care  team about the use of this medication in children. Special care may be needed. Overdosage: If you think you have taken too much of this medicine contact a poison control center or emergency room at once. NOTE: This medicine is only for you. Do not share this medicine with others. What if I miss a dose? Keep appointments for follow-up doses. It is important not to miss your dose. Call your care team if you are unable to keep an appointment. What may interact with this medication? Other medications may affect the way this medication works. Talk with your care team about all of the medications you take. They may suggest changes to your treatment plan to lower the risk of side effects and to make sure your medications work as intended. This list may not describe all possible interactions. Give your health care provider a list of all the medicines, herbs, non-prescription drugs, or dietary supplements you use. Also tell them if you smoke, drink alcohol, or use illegal drugs. Some items may interact with your medicine. What should I watch for while using this medication? Your condition will be monitored carefully while you are receiving this medication. You may need blood work while taking this medication. This medication may make you feel generally unwell. This is not uncommon as chemotherapy can affect healthy cells as well as cancer cells. Report any side effects. Continue your course of treatment even though you feel ill unless your care team tells you to stop. This medication can cause serious allergic reactions. To reduce the risk, your care team may give you other medications to take before receiving this one. Be sure to follow the directions from your care team. This medication may increase your risk of getting an infection. Call your care team for advice if you get a fever, chills, sore throat, or other symptoms of a cold or flu. Do not treat yourself. Try to avoid being around people who are  sick. This medication may increase your risk to bruise or bleed. Call your care team if you notice any unusual bleeding. Be careful brushing or flossing your teeth or using a toothpick because you may get an infection or bleed more easily. If you have any dental work done, tell your dentist you are receiving this medication. Talk to your care team if you or your partner may be pregnant. Serious birth defects can occur if you take this medication during pregnancy and for 6 months after the last dose. You will need a negative pregnancy test before starting this medication. Contraception is recommended while taking this medication and for 6 months after the last dose. Your care team can help you find the option that works for you. If your partner can get pregnant, use a condom during sex while taking this medication and for 3 months after the last dose. Do not breastfeed while taking this medication and for 2 weeks after the last dose. This medication may cause infertility. Talk to your care team if you are concerned about your fertility. What side effects may I notice from receiving this medication? Side effects that you should report to your care team as soon as possible: Allergic  reactions--skin rash, itching, hives, swelling of the face, lips, tongue, or throat Dry cough, shortness of breath or trouble breathing Infection--fever, chills, cough, sore throat, wounds that don't heal, pain or trouble when passing urine, general feeling of discomfort or being unwell Low red blood cell level--unusual weakness or fatigue, dizziness, headache, trouble breathing Pain, tingling, or numbness in the hands or feet Stomach pain, unusual weakness or fatigue, nausea, vomiting, diarrhea, or fever that lasts longer than expected Unusual bruising or bleeding Side effects that usually do not require medical attention (report to your care team if they continue or are bothersome): Diarrhea Fatigue Hair loss Loss of  appetite Nausea Vomiting This list may not describe all possible side effects. Call your doctor for medical advice about side effects. You may report side effects to FDA at 1-800-FDA-1088. Where should I keep my medication? This medication is given in a hospital or clinic. It will not be stored at home. NOTE: This sheet is a summary. It may not cover all possible information. If you have questions about this medicine, talk to your doctor, pharmacist, or health care provider.  2024 Elsevier/Gold Standard (2021-05-29 00:00:00)  Gemcitabine Injection What is this medication? GEMCITABINE (jem SYE ta been) treats some types of cancer. It works by slowing down the growth of cancer cells. This medicine may be used for other purposes; ask your health care provider or pharmacist if you have questions. COMMON BRAND NAME(S): Gemzar, Infugem What should I tell my care team before I take this medication? They need to know if you have any of these conditions: Blood disorders Infection Kidney disease Liver disease Lung or breathing disease, such as asthma or COPD Recent or ongoing radiation therapy An unusual or allergic reaction to gemcitabine, other medications, foods, dyes, or preservatives If you or your partner are pregnant or trying to get pregnant Breast-feeding How should I use this medication? This medication is injected into a vein. It is given by your care team in a hospital or clinic setting. Talk to your care team about the use of this medication in children. Special care may be needed. Overdosage: If you think you have taken too much of this medicine contact a poison control center or emergency room at once. NOTE: This medicine is only for you. Do not share this medicine with others. What if I miss a dose? Keep appointments for follow-up doses. It is important not to miss your dose. Call your care team if you are unable to keep an appointment. What may interact with this  medication? Interactions have not been studied. This list may not describe all possible interactions. Give your health care provider a list of all the medicines, herbs, non-prescription drugs, or dietary supplements you use. Also tell them if you smoke, drink alcohol, or use illegal drugs. Some items may interact with your medicine. What should I watch for while using this medication? Your condition will be monitored carefully while you are receiving this medication. This medication may make you feel generally unwell. This is not uncommon, as chemotherapy can affect healthy cells as well as cancer cells. Report any side effects. Continue your course of treatment even though you feel ill unless your care team tells you to stop. In some cases, you may be given additional medications to help with side effects. Follow all directions for their use. This medication may increase your risk of getting an infection. Call your care team for advice if you get a fever, chills, sore throat, or other symptoms  of a cold or flu. Do not treat yourself. Try to avoid being around people who are sick. This medication may increase your risk to bruise or bleed. Call your care team if you notice any unusual bleeding. Be careful brushing or flossing your teeth or using a toothpick because you may get an infection or bleed more easily. If you have any dental work done, tell your dentist you are receiving this medication. Avoid taking medications that contain aspirin, acetaminophen, ibuprofen, naproxen, or ketoprofen unless instructed by your care team. These medications may hide a fever. Talk to your care team if you or your partner wish to become pregnant or think you might be pregnant. This medication can cause serious birth defects if taken during pregnancy and for 6 months after the last dose. A negative pregnancy test is required before starting this medication. A reliable form of contraception is recommended while taking  this medication and for 6 months after the last dose. Talk to your care team about effective forms of contraception. Do not father a child while taking this medication and for 3 months after the last dose. Use a condom while having sex during this time period. Do not breastfeed while taking this medication and for at least 1 week after the last dose. This medication may cause infertility. Talk to your care team if you are concerned about your fertility. What side effects may I notice from receiving this medication? Side effects that you should report to your care team as soon as possible: Allergic reactions--skin rash, itching, hives, swelling of the face, lips, tongue, or throat Capillary leak syndrome--stomach or muscle pain, unusual weakness or fatigue, feeling faint or lightheaded, decrease in the amount of urine, swelling of the ankles, hands, or feet, trouble breathing Infection--fever, chills, cough, sore throat, wounds that don't heal, pain or trouble when passing urine, general feeling of discomfort or being unwell Liver injury--right upper belly pain, loss of appetite, nausea, light-colored stool, dark yellow or brown urine, yellowing skin or eyes, unusual weakness or fatigue Low red blood cell level--unusual weakness or fatigue, dizziness, headache, trouble breathing Lung injury--shortness of breath or trouble breathing, cough, spitting up blood, chest pain, fever Stomach pain, bloody diarrhea, pale skin, unusual weakness or fatigue, decrease in the amount of urine, which may be signs of hemolytic uremic syndrome Sudden and severe headache, confusion, change in vision, seizures, which may be signs of posterior reversible encephalopathy syndrome (PRES) Unusual bruising or bleeding Side effects that usually do not require medical attention (report to your care team if they continue or are bothersome): Diarrhea Drowsiness Hair loss Nausea Pain, redness, or swelling with sores inside the  mouth or throat Vomiting This list may not describe all possible side effects. Call your doctor for medical advice about side effects. You may report side effects to FDA at 1-800-FDA-1088. Where should I keep my medication? This medication is given in a hospital or clinic. It will not be stored at home. NOTE: This sheet is a summary. It may not cover all possible information. If you have questions about this medicine, talk to your doctor, pharmacist, or health care provider.  2024 Elsevier/Gold Standard (2021-05-20 00:00:00)

## 2023-01-26 NOTE — Progress Notes (Signed)
 Clarfied- today ok to treat with yesterday's labs. Hemoglobin of 7.8 and heart rate 102-105 is ok to treat today per Powell Lessen, NP.  Secondary set of labs were drawn today and the hemoglobin was 8.0- per patient choice and Powell Lessen, NP- ok to hold previously ordered unit of blood for today. Treatment only.

## 2023-01-28 ENCOUNTER — Telehealth: Payer: Self-pay

## 2023-01-28 NOTE — H&P (Addendum)
 Chief Complaint: Patient was seen in consultation today for cholangiogram with possible internalization/gallbladder drain exchange  Referring Physician(s): Dr. Aloha Finner, MD  Supervising Physician: Luverne Aran  Patient Status: [facility] - Out-pt    History of Present Illness: Kathryn Underwood is a 63 y.o. female with PMHx of anxiety/ depression, neuropathy, polysubstance abuse, ETOH pancreatitis, pancreatic pseudocyst, GERD, and pancreatic cancer.  Patient is known to IR service, having most recently undergone a percutaneous cholecystostomy on 12/13/22 and Port-A-Cath placement on 11/22 by Dr. Philip.   Patient was recently diagnosed with pancreatic adenocarcinoma and pancreatic pseudocyst during admission in November 2024, along with portal vein thrombosis with cholecystostomy tube placement on 12/13/2022. Subsequent admission from 12/23/2022 to 12/27/2022 for partial gastric outlet obstruction and duodenal stenosis which was managed conservatively.   She was re-admitted on 12/08 with recurring gastric outlet obstruction right lower lobe pneumonia. Patient is status post EGD/EUS on 01/06/2023 which showed LA grade D erosive esophagitis along with duodenal stenosis which was stented and a cystic lesion in the pancreatic fine-needle aspiration was performed. GI signed off. Patient resumed on Eliquis  as per GI recommendation given history of portal vein thrombosis.  Patient was discharged home on 12/17 with Home health services arranged.   Patient presents for follow-up on her perc chole drain with cholangiogram and possible internalization. She is scheduled for same in IR today.  Per Dr. Melba note on 01/07/23: Hopefully interventional radiology will be able to traverse her cystic duct at some point to allow stent placement into the distal biliary duct, otherwise percutaneous cholecystostomy tube will remain her biliary drainage.       Past Medical History:  Diagnosis  Date   Alcohol abuse    Anxiety    Chickenpox    Depression    Neuromuscular disorder (HCC)    neuropathy   Pancreatic cancer (HCC)    Substance abuse [facility])     Past Surgical History:  Procedure Laterality Date   BIOPSY  12/10/2022   Procedure: BIOPSY;  Surgeon: Mansouraty, Aloha Raddle., MD;  Location: [facility];  Service: Gastroenterology;;   CESAREAN SECTION     DUODENAL STENT PLACEMENT N/A 01/06/2023   Procedure: DUODENAL STENT PLACEMENT;  Surgeon: Finner Aloha Raddle., MD;  Location: [facility];  Service: Gastroenterology;  Laterality: N/A;   ESOPHAGOGASTRODUODENOSCOPY N/A 08/14/2021   Procedure: ESOPHAGOGASTRODUODENOSCOPY (EGD);  Surgeon: Teressa Toribio SQUIBB, MD;  Location: [facility];  Service: Gastroenterology;  Laterality: N/A;   ESOPHAGOGASTRODUODENOSCOPY N/A 12/12/2022   Procedure: ESOPHAGOGASTRODUODENOSCOPY (EGD);  Surgeon: Finner Aloha Raddle., MD;  Location: [facility];  Service: Gastroenterology;  Laterality: N/A;   ESOPHAGOGASTRODUODENOSCOPY (EGD) WITH PROPOFOL  N/A 12/10/2022   Procedure: ESOPHAGOGASTRODUODENOSCOPY (EGD) WITH PROPOFOL ;  Surgeon: Finner Aloha Raddle., MD;  Location: [facility];  Service: Gastroenterology;  Laterality: N/A;   ESOPHAGOGASTRODUODENOSCOPY (EGD) WITH PROPOFOL  N/A 01/06/2023   Procedure: ESOPHAGOGASTRODUODENOSCOPY (EGD) WITH PROPOFOL ;  Surgeon: Finner Aloha Raddle., MD;  Location: [facility];  Service: Gastroenterology;  Laterality: N/A;  with duodenal stent placement   EUS N/A 08/14/2021   Procedure: UPPER ENDOSCOPIC ULTRASOUND (EUS) RADIAL;  Surgeon: Teressa Toribio SQUIBB, MD;  Location: [facility];  Service: Gastroenterology;  Laterality: N/A;   EUS N/A 12/12/2022   Procedure: UPPER ENDOSCOPIC ULTRASOUND (EUS) LINEAR;  Surgeon: Finner Aloha Raddle., MD;  Location: [facility];  Service: Gastroenterology;  Laterality: N/A;   EUS N/A 01/06/2023   Procedure: FULL UPPER ENDOSCOPIC ULTRASOUND (EUS) RADIAL;  Surgeon:  Finner Aloha Raddle., MD;  Location: [facility];  Service: Gastroenterology;  Laterality:  N/A;   FINE NEEDLE ASPIRATION N/A 08/14/2021   Procedure: FINE NEEDLE ASPIRATION (FNA) LINEAR;  Surgeon: Teressa Toribio SQUIBB, MD;  Location: [facility];  Service: Gastroenterology;  Laterality: N/A;   FINE NEEDLE ASPIRATION N/A 12/12/2022   Procedure: FINE NEEDLE ASPIRATION (FNA) LINEAR;  Surgeon: Wilhelmenia Aloha Raddle., MD;  Location: [facility];  Service: Gastroenterology;  Laterality: N/A;   FINE NEEDLE ASPIRATION  01/06/2023   Procedure: FINE NEEDLE ASPIRATION;  Surgeon: Wilhelmenia Aloha Raddle., MD;  Location: [facility];  Service: Gastroenterology;;   IR IMAGING GUIDED PORT INSERTION  12/18/2022   IR PERC CHOLECYSTOSTOMY  12/13/2022   TONSILLECTOMY      Allergies: Patient has no known allergies.  Medications: Prior to Admission medications   Medication Sig Start Date End Date Taking? Authorizing Provider  acetaminophen  (TYLENOL ) 500 MG tablet Take 500-1,000 mg by mouth every 6 (six) hours as needed for moderate pain (pain score 4-6).    [provider]  apixaban  (ELIQUIS ) 5 MG TABS tablet Take 1 tablet (5 mg total) by mouth 2 (two) times daily. Patient not taking: Reported on 01/21/2023 01/12/23   Leotis Bogus, MD  Ensure Max Protein (ENSURE MAX PROTEIN) LIQD Take 330 mLs (11 oz total) by mouth 2 (two) times daily. 12/18/22   Gonfa, Taye T, MD  gabapentin  (NEURONTIN ) 300 MG capsule Take 2 capsules (600 mg total) by mouth 2 (two) times daily. Patient taking differently: Take 900 mg by mouth at bedtime. 10/09/17   Lenor Hollering, MD  lidocaine -prilocaine  (EMLA ) cream Apply to affected area once 12/28/22   Lanny Callander, MD  mirtazapine  (REMERON ) 45 MG tablet Take 45 mg by mouth at bedtime. 11/05/21   [provider]  ondansetron  (ZOFRAN ) 8 MG tablet Take 1 tablet (8 mg total) by mouth every 8 (eight) hours as needed for nausea or vomiting. 12/28/22   Lanny Callander, MD  oxyCODONE   (OXY IR/ROXICODONE ) 5 MG immediate release tablet Take 1 tablet (5 mg total) by mouth every 4 (four) hours as needed for moderate pain (pain score 4-6). 01/25/23   Hanford Powell BRAVO, NP  pantoprazole  (PROTONIX ) 40 MG tablet Take 1 tablet (40 mg total) by mouth 2 (two) times daily. 12/27/22   Will Almarie MATSU, MD  prochlorperazine  (COMPAZINE ) 10 MG tablet Take 1 tablet (10 mg total) by mouth every 6 (six) hours as needed for nausea or vomiting. 12/28/22   Lanny Callander, MD  QUEtiapine  (SEROQUEL ) 100 MG tablet Take 200 mg by mouth at bedtime.    [provider]  sodium chloride  flush (NS) 0.9 % SOLN Use 5 - 10 mLs to flush abdominal drain once daily as directed 12/18/22   Jennamarie Goings K, PA-C  sucralfate  (CARAFATE ) 1 GM/10ML suspension TAKE 10 MLS (1 G TOTAL) BY MOUTH 4 (FOUR) TIMES DAILY - WITH MEALS AND AT BEDTIME. 01/19/23   Allwardt, Alyssa M, PA-C  Vitamin D , Ergocalciferol , (DRISDOL ) 1.25 MG (50000 UNIT) CAPS capsule Take 1 capsule (50,000 Units total) by mouth every 7 (seven) days. 01/02/23   Will Almarie MATSU, MD     Family History  Problem Relation Age of Onset   Dementia Mother    Arthritis Father    Asthma Father    Asthma Brother    Diabetes Maternal Grandmother    Dementia Maternal Grandmother    Diabetes Paternal Grandmother    Parkinson's disease Maternal Grandfather    Parkinson's disease Paternal Grandfather    Breast cancer Neg Hx    Colon cancer Neg Hx  Social History   Socioeconomic History   Marital status: Divorced    Spouse name: Not on file   Number of children: Not on file   Years of education: Not on file   Highest education level: Not on file  Occupational History   Not on file  Tobacco Use   Smoking status: Some Days    Current packs/day: 0.50    Types: Cigarettes   Smokeless tobacco: Never  Vaping Use   Vaping status: Never Used  Substance and Sexual Activity   Alcohol use: Not Currently    Alcohol/week: 16.0 standard drinks of  alcohol    Types: 16 Glasses of wine per week    Comment: daily (1.5 liter bottle wine every 12)   Drug use: Not Currently    Types: Cocaine, Marijuana   Sexual activity: Yes    Partners: Male    Comment: female partner  Other Topics Concern   Not on file  Social History Narrative   Divorced. Homemaker. HS grad.    Lives with life partner (stan)   Drinks caffeine.   Wears seatbelt. Smoke dectector in the home. No firearms in the home.    Feels safe in her relationships.     Social Drivers of Corporate Investment Banker Strain: Not on file  Food Insecurity: No Food Insecurity (01/03/2023)   Hunger Vital Sign    Worried About Running Out of Food in the Last Year: Never true    Ran Out of Food in the Last Year: Never true  Transportation Needs: No Transportation Needs (01/03/2023)   PRAPARE - Administrator, Civil Service (Medical): No    Lack of Transportation (Non-Medical): No  Physical Activity: Not on file  Stress: Not on file  Social Connections: Not on file     Review of Systems: denies fever, HA,CP,worsening dyspnea, cough, N/V or bleeding; she has weight loss, weakness/fatigue, abd/back discomfort  Vital Signs: Vitals:   01/29/23 0815  BP: 118/67  Pulse: (!) 115  Resp: 18  Temp: 99.7 F (37.6 C)  SpO2: 96%   Code Status: FULL CODE    Physical Exam: awake/alert; chest- distant BS bilat; clean, intact left chest wall port a cath; heart- tachy but reg rhythm; abd-soft,+BS, sl dist, GB drain intact, insertion site ok and draining green bile; some mild diffuse tenderness to palpation, no LE edema  Imaging: CT ABDOMEN PELVIS W CONTRAST Result Date: 01/19/2023 CLINICAL DATA:  63 year old female with history of pancreatic cancer. Abdominal pain. * Tracking Code: BO * EXAM: CT ABDOMEN AND PELVIS WITH CONTRAST TECHNIQUE: Multidetector CT imaging of the abdomen and pelvis was performed using the standard protocol following bolus administration of intravenous  contrast. RADIATION DOSE REDUCTION: This exam was performed according to the departmental dose-optimization program which includes automated exposure control, adjustment of the mA and/or kV according to patient size and/or use of iterative reconstruction technique. CONTRAST:  75mL OMNIPAQUE  IOHEXOL  300 MG/ML  SOLN COMPARISON:  CT of the abdomen and pelvis 01/03/2023. FINDINGS: Lower chest: Scattered areas of bronchial wall thickening with thickening of the peribronchovascular interstitium and some peribronchovascular ground-glass attenuation and micro nodularity, most evident in the left lower lobe, similar to the prior study, likely reflecting sequela of recurrent aspiration. Small amount of pericardial fluid and/or thickening, similar to the prior study, unlikely to be of hemodynamic significance at this time. Hepatobiliary: Multiple well-circumscribed low-attenuation liver lesions are similar to prior studies. The largest of these are compatible with simple cysts, measuring  up to 1.3 cm in segment 8 (axial image 10 of series 2). The smaller lesions are too small to definitively characterize, but likely to represent tiny cysts and/or biliary hamartomas. Gallbladder is completely decompressed with a percutaneous cholecystostomy tube in position in the fundus of the gallbladder. There continues to be severe intra and extrahepatic biliary ductal dilatation, with abrupt cut off of the proximal common bile duct (coronal image 44 of series 7), proximal to which the common bile duct measures up to 16 mm in diameter. Pancreas: Poorly defined infiltrative hypovascular mass in the head of the pancreas with highly infiltrative appearance and poorly defined margins, estimated to measure approximately 6.0 x 5.1 x 7.1 cm on today's study (axial image 25 of series 2 and coronal image 51 of series 7). Body and tail of the pancreas are severely atrophic. 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 Notably, there is a large collateral pathway in the right-side of the upper abdomen which supplies flow to the portal vein which remains patent at this time. Additional cavernous transformation is noted in the porta hepatis and around the gallbladder fossa. Rim enhancing fluid collection adjacent to the body of the pancreas, extending anteriorly and superiorly making contact with the lesser curvature of the stomach (axial image 28 of series 2) measuring 6.1 x 4.9 cm, most compatible with a pancreatic pseudocyst. A smaller but similar appearing lesion is also noted adjacent to the pancreatic head (axial image 33 of series 2) measuring 3.4 x 1.9 cm. Spleen: Unremarkable. Adrenals/Urinary Tract: Bilateral kidneys and adrenal glands are normal in appearance. No hydroureteronephrosis. Urinary bladder is unremarkable in appearance. Stomach/Bowel: Interval placement of a stent traversing the pylorus. Stomach is moderately distended filled with gas and fluid. No pathologic dilatation of small bowel or colon. The appendix is not confidently identified and may be surgically absent. Regardless, there are no inflammatory changes noted adjacent to the cecum to suggest the presence of an acute appendicitis at this time. Vascular/Lymphatic: Vascular findings pertinent to pancreatic neoplasm, as above. Atherosclerosis in the abdominal aorta and pelvic vasculature, without definite aneurysm or dissection in the abdominal or pelvic vasculature. Multiple prominent borderline enlarged lymph nodes are noted in the central small bowel mesentery measuring up to 8 mm in short axis (axial image 38 of series 2). Reproductive:  Uterus is heterogeneous in appearance with multiple coarsely calcified lesions most compatible fibroids, largest of which measures 3.6 cm in the body of the uterus. Ovaries are atrophic. Other: Trace ascites.  No pneumoperitoneum. Musculoskeletal: There are no aggressive appearing lytic or blastic lesions noted in the visualized portions of the skeleton. IMPRESSION: 1. Poorly defined infiltrative hypovascular mass centered in the head of the pancreas, compatible with the reported pancreatic neoplasm. This is highly invasive involving multiple vascular structures, as detailed above. Overall, the mass is currently estimated to measure approximately 6.0 x 5.1 x 7.1 cm. 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 2. 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 3. Previously described pancreatic head mass causes obstruction of  the common bile duct with severe intrahepatic biliary ductal dilatation. 4. Multiple small lesions in the liver grossly stable compared to prior examinations, favored to represent tiny cysts. 5. Gallbladder is completely decompressed with percutaneous cholecystostomy tube in position. 6. Interval placement of stent traversing the pylorus. 7. Aortic atherosclerosis. 8. Additional incidental findings, as above. Electronically Signed   By: Toribio Aye M.D.   On: 01/19/2023 07:54   DG ESOPHAGUS DILATION Result Date: 01/06/2023 ESOPHAGEAL DILATATION: Fluoroscopy was provided for use by the requesting physician.  No images were obtained for radiographic interpretation.  DG C-Arm 1-60 Min-No Report Result Date: 01/06/2023 Fluoroscopy was utilized by the requesting physician.  No radiographic interpretation.   DG Abd Portable 1V Result Date:  01/04/2023 CLINICAL DATA:  Nasogastric tube placement. EXAM: PORTABLE ABDOMEN - 1 VIEW COMPARISON:  Portable chest dated 01/03/2023 and abdomen and pelvis CT dated 01/03/2023. FINDINGS: Nasogastric tube side hole just distal to the gastroesophageal junction and tip in the proximal to mid stomach. With left jugular catheter tip at the superior cavoatrial junction. Resolved left basilar atelectasis. The included bowel-gas pattern is unremarkable. IMPRESSION: Nasogastric tube tip in the proximal to mid stomach. Electronically Signed   By: Elspeth Bathe M.D.   On: 01/04/2023 17:29   CT ABDOMEN PELVIS WO CONTRAST Result Date: 01/03/2023 CLINICAL DATA:  Abdominal pain. Nausea vomiting for several days. Reported history of pancreatic carcinoma. EXAM: CT ABDOMEN AND PELVIS WITHOUT CONTRAST TECHNIQUE: Multidetector CT imaging of the abdomen and pelvis was performed following the standard protocol without IV contrast. RADIATION DOSE REDUCTION: This exam was performed according to the departmental dose-optimization program which includes automated exposure control, adjustment of the mA and/or kV according to patient size and/or use of iterative reconstruction technique. COMPARISON:  12/23/2022. FINDINGS: Lower chest: Ill-defined peribronchovascular ground-glass and more confluent opacities, most evident in the left lower lobe, new since the prior CT. No pleural effusion. Small pericardial effusion similar to the prior CT. Fluid mildly distends the distal esophagus. Hepatobiliary: Ill-defined hypoattenuation involving segments 4 and 5 as well as portions of segment 6, better defined on the prior contrast enhanced CT, consistent with metastatic disease. There also several more well-defined hypoattenuating liver masses there are stable. No new liver abnormalities. Pigtail drainage catheter lies along the inferior margin of the right lobe, unchanged. Pancreas: Heterogeneous mass with ill-defined margins arises from the  pancreatic head with coarse central calcifications. This is unchanged from the prior CT. Fluid collection abuts the right inferior pancreatic head and adjacent duodenum measuring 3.7 cm. Larger fluid collection abuts the proximal pancreatic tail, posterior to the stomach, 5.8 cm in size. These are similar to the prior CT suspected to be pseudocysts. There is some fat stranding adjacent to the pancreas, similar to the prior CT. Spleen: Normal. Adrenals/Urinary Tract: Mild bilateral adrenal gland thickening consistent with hyperplasia. Normal kidneys, ureters and bladder. Stomach/Bowel: Significant gastric distension. No stomach wall thickening. Duodenum broadly abuts the pancreatic head mass. Wall not defined separate from the mass on this unenhanced exam. Small bowel and colon are normal in caliber. No wall thickening or inflammation. Mild to moderate increase in the colonic stool burden similar to the prior CT. Vascular/Lymphatic: Aortic atherosclerotic calcifications. No defined enlarged lymph nodes. Reproductive: Partly calcified uterine fibroids.  No adnexal masses. Other: No ascites. Musculoskeletal: No fracture or acute finding.  No bone lesion. IMPRESSION: 1. Significant stomach distention. This is suspected to be due to partial gastric outlet obstruction at the level of the duodenum due to the  large pancreatic mass. 2. Pancreatic mass, liver lesions and right upper quadrant drainage catheter are without significant change from the recent prior CT, better defined on the prior study due to the presence of intravenous contrast. 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 These are also unchanged from the recent prior CT. Electronically Signed   By: Alm Parkins M.D.   On: 01/03/2023 19:09   DG Chest Portable 1 View Result Date: 01/03/2023 CLINICAL DATA:  Shortness of breath EXAM: PORTABLE CHEST 1 VIEW COMPARISON:  CT chest dated 12/17/2022 FINDINGS: Mild left lower  lobe opacity, atelectasis versus pneumonia. Right lung is clear. No pleural effusion or pneumothorax. The heart is normal in size. Left chest power port terminates in the upper right atrium. IMPRESSION: Mild left lower lobe opacity, atelectasis versus pneumonia. Electronically Signed   By: Pinkie Pebbles M.D.   On: 01/03/2023 17:28    Labs:  CBC: Recent Labs    01/19/23 0613 01/21/23 1148 01/25/23 1313 01/26/23 1107  WBC 10.9* 7.9 9.9 9.9  HGB 8.3* 8.3 Repeated and verified X2.* 7.8* 8.0*  HCT 25.0* 24.5 Repeated and verified X2.* 23.4* 23.6*  PLT 635* 688.0* 729* 804*    COAGS: Recent Labs    12/13/22 0611 12/27/22 0420 12/27/22 1211  INR 1.3*  --   --   APTT  --  46* 97*    BMP: Recent Labs    01/12/23 0334 01/19/23 0613 01/21/23 1148 01/25/23 1313 01/26/23 1107  NA 133* 136 138 137 135  K 4.3 3.4* 3.5 3.7 4.6  CL 101 103 106 103 101  CO2 27 25 25 28 29   GLUCOSE 121* 155* 121* 165* 103*  BUN 13 20 19 16 16   CALCIUM 8.0* 8.4* 8.0* 8.3* 8.5*  CREATININE 0.41* 0.36* 0.40 0.33* 0.34*  GFRNONAA >60 >60  --  >60 >60    LIVER FUNCTION TESTS: Recent Labs    01/19/23 0613 01/21/23 1148 01/25/23 1313 01/26/23 1107  BILITOT 0.5 0.4 0.5 0.5  AST 26 17 30 28   ALT 22 20 43 42  ALKPHOS 683* 730* 741* 729*  PROT 5.6* 5.5* 5.5* 5.7*  ALBUMIN 2.5* 2.9* 2.9* 3.0*    TUMOR MARKERS: No results for input(s): AFPTM, CEA, CA199, CHROMGRNA in the last 8760 hours.  Assessment and Plan: Patient is known to IR service, having most recently undergone a percutaneous cholecystostomy on 12/13/22 and Port-A-Cath placement on 11/22 by Dr. Philip.   Patient was recently diagnosed with pancreatic adenocarcinoma and pancreatic pseudocyst during admission in November 2024, along with portal vein thrombosis with cholecystostomy tube placement on 12/13/2022. Subsequent admission from 12/23/2022 to 12/27/2022 for partial gastric outlet obstruction and duodenal stenosis which was  managed conservatively.   She was re-admitted on 12/08 with recurring gastric outlet obstruction right lower lobe pneumonia. Patient is status post EGD/EUS on 01/06/2023 which showed LA grade D erosive esophagitis along with duodenal stenosis which was stented and a cystic lesion in the pancreatic fine-needle aspiration was performed. GI signed off. Patient resumed on Eliquis  as per GI recommendation given history of portal vein thrombosis.  Patient was discharged home on 12/17 with Home health services arranged.   Patient presents for follow-up on her perc chole drain with cholangiogram and possible internalization. She is scheduled for same in IR today.  Per Dr. Melba note on 01/07/23: Hopefully interventional radiology will be able to traverse her cystic duct at some point to allow stent placement into the distal biliary duct, otherwise percutaneous  cholecystostomy tube will remain her biliary drainage.    Risks and benefits of cholangiogram with gallbladder drain exchange/possible  internalization was discussed with the patient  including, but not limited to bleeding, infection, damage to adjacent structures.  All of the questions were answered and there is agreement to proceed.  Consent signed and in chart.  LABS PENDING    Thank you for this interesting consult.  I greatly enjoyed meeting Kathryn Underwood and look forward to participating in their care.  A copy of this report was sent to the requesting provider on this date.  Electronically Signed: Carlin DELENA Griffon, PA-C/Kevin Octavius Shin,PA-C 01/28/2023, 2:27 PM   I spent a total of 20 minutes  in face to face in clinical consultation, greater than 50% of which was counseling/coordinating care for cholangiogram with gallbladder drain exchange/possible internalization.

## 2023-01-28 NOTE — Telephone Encounter (Signed)
-----   Message from Nurse Lanora Manis A sent at 01/26/2023  3:04 PM EST ----- Regarding: First timer First time abraxane/gemzar- Dr. Mosetta Putt. Tolerated well. 01/26/23

## 2023-01-28 NOTE — Telephone Encounter (Signed)
 Kathryn Underwood states that she is doing fine. She is eating, drinking, and urinating well. She knows to call the office at [phone] if  she has any questions or concerns.

## 2023-01-29 ENCOUNTER — Ambulatory Visit (HOSPITAL_COMMUNITY)
Admission: RE | Admit: 2023-01-29 | Discharge: 2023-01-29 | Disposition: A | Discharge status: Home / Self Care | Payer: Medicaid Other | Source: Ambulatory Visit | Attending: Radiology

## 2023-01-29 ENCOUNTER — Other Ambulatory Visit: Payer: Self-pay

## 2023-01-29 ENCOUNTER — Encounter (HOSPITAL_COMMUNITY): Payer: Self-pay

## 2023-01-29 ENCOUNTER — Ambulatory Visit (HOSPITAL_COMMUNITY)
Admission: RE | Admit: 2023-01-29 | Discharge: 2023-01-29 | Disposition: A | Discharge status: Home / Self Care | Payer: Medicaid Other | Source: Ambulatory Visit | Attending: Radiology | Admitting: Radiology

## 2023-01-29 ENCOUNTER — Other Ambulatory Visit (HOSPITAL_COMMUNITY): Payer: Self-pay | Admitting: Radiology

## 2023-01-29 VITALS — BP 105/65 | HR 108 | Temp 98.6°F | Resp 18 | Ht 63.0 in | Wt 101.6 lb

## 2023-01-29 DIAGNOSIS — Z7901 Long term (current) use of anticoagulants: Secondary | ICD-10-CM | POA: Insufficient documentation

## 2023-01-29 DIAGNOSIS — K863 Pseudocyst of pancreas: Secondary | ICD-10-CM | POA: Diagnosis not present

## 2023-01-29 DIAGNOSIS — K219 Gastro-esophageal reflux disease without esophagitis: Secondary | ICD-10-CM | POA: Diagnosis not present

## 2023-01-29 DIAGNOSIS — K831 Obstruction of bile duct: Secondary | ICD-10-CM | POA: Diagnosis not present

## 2023-01-29 DIAGNOSIS — F419 Anxiety disorder, unspecified: Secondary | ICD-10-CM | POA: Diagnosis not present

## 2023-01-29 DIAGNOSIS — F32A Depression, unspecified: Secondary | ICD-10-CM | POA: Insufficient documentation

## 2023-01-29 DIAGNOSIS — C259 Malignant neoplasm of pancreas, unspecified: Secondary | ICD-10-CM | POA: Insufficient documentation

## 2023-01-29 DIAGNOSIS — Z434 Encounter for attention to other artificial openings of digestive tract: Secondary | ICD-10-CM | POA: Insufficient documentation

## 2023-01-29 DIAGNOSIS — F17210 Nicotine dependence, cigarettes, uncomplicated: Secondary | ICD-10-CM | POA: Diagnosis not present

## 2023-01-29 DIAGNOSIS — Z01818 Encounter for other preprocedural examination: Secondary | ICD-10-CM

## 2023-01-29 HISTORY — PX: IR CHOLANGIOGRAM EXISTING TUBE: IMG6040

## 2023-01-29 HISTORY — PX: IR EXCHANGE BILIARY DRAIN: IMG6046

## 2023-01-29 LAB — CBC
HCT: 24.4 % — ABNORMAL LOW (ref 36.0–46.0)
Hemoglobin: 7.8 g/dL — ABNORMAL LOW (ref 12.0–15.0)
MCH: 30.6 pg (ref 26.0–34.0)
MCHC: 32 g/dL (ref 30.0–36.0)
MCV: 95.7 fL (ref 80.0–100.0)
Platelets: 671 10*3/uL — ABNORMAL HIGH (ref 150–400)
RBC: 2.55 MIL/uL — ABNORMAL LOW (ref 3.87–5.11)
RDW: 15.6 % — ABNORMAL HIGH (ref 11.5–15.5)
WBC: 7.9 10*3/uL (ref 4.0–10.5)
nRBC: 0 % (ref 0.0–0.2)

## 2023-01-29 LAB — COMPREHENSIVE METABOLIC PANEL
ALT: 32 U/L (ref 0–44)
AST: 22 U/L (ref 15–41)
Albumin: 2.6 g/dL — ABNORMAL LOW (ref 3.5–5.0)
Alkaline Phosphatase: 511 U/L — ABNORMAL HIGH (ref 38–126)
Anion gap: 6 (ref 5–15)
BUN: 20 mg/dL (ref 8–23)
CO2: 27 mmol/L (ref 22–32)
Calcium: 8.4 mg/dL — ABNORMAL LOW (ref 8.9–10.3)
Chloride: 100 mmol/L (ref 98–111)
Creatinine, Ser: 0.31 mg/dL — ABNORMAL LOW (ref 0.44–1.00)
GFR, Estimated: >60 mL/min (ref >60)
Glucose, Bld: 119 mg/dL — ABNORMAL HIGH (ref 70–99)
Potassium: 3.9 mmol/L (ref 3.5–5.1)
Sodium: 133 mmol/L — ABNORMAL LOW (ref 135–145)
Total Bilirubin: 0.9 mg/dL (ref 0.0–1.2)
Total Protein: 6 g/dL — ABNORMAL LOW (ref 6.5–8.1)

## 2023-01-29 LAB — PROTIME-INR
INR: 1.2 (ref 0.8–1.2)
Prothrombin Time: 15.2 s (ref 11.4–15.2)

## 2023-01-29 MED ORDER — FENTANYL CITRATE (PF) 100 MCG/2ML IJ SOLN
INTRAMUSCULAR | Status: AC | PRN
  Administered 2023-01-29 (×2): 50 ug via INTRAVENOUS

## 2023-01-29 MED ORDER — LIDOCAINE HCL 1 % IJ SOLN
10.0000 mL | Freq: Once | INTRAMUSCULAR | Status: AC
  Administered 2023-01-29: 1 mL via INTRADERMAL

## 2023-01-29 MED ORDER — FENTANYL CITRATE (PF) 100 MCG/2ML IJ SOLN
INTRAMUSCULAR | Status: AC
  Filled 2023-01-29: qty 2

## 2023-01-29 MED ORDER — SODIUM CHLORIDE 0.9 % IV SOLN
2.0000 g | INTRAVENOUS | Status: DC
  Filled 2023-01-29: qty 20

## 2023-01-29 MED ORDER — LIDOCAINE HCL 1 % IJ SOLN
INTRAMUSCULAR | Status: AC
  Filled 2023-01-29: qty 20

## 2023-01-29 MED ORDER — IOHEXOL 300 MG/ML  SOLN
50.0000 mL | Freq: Once | INTRAMUSCULAR | Status: AC | PRN
Start: 2023-01-29 — End: 2023-01-29
  Administered 2023-01-29: 20 mL

## 2023-01-29 MED ORDER — HEPARIN SOD (PORK) LOCK FLUSH 100 UNIT/ML IV SOLN
500.0000 [IU] | INTRAVENOUS | Status: AC | PRN
  Administered 2023-01-29: 500 [IU]
  Filled 2023-01-29: qty 5

## 2023-01-29 MED ORDER — SODIUM CHLORIDE 0.9 % IV SOLN: INTRAVENOUS | Status: DC

## 2023-01-29 NOTE — Congregational Nurse Program (Signed)
 1200 Discharged to car by wheelchair by staff.

## 2023-01-29 NOTE — Discharge Instructions (Addendum)
 Discharge Instructions:   Please call Interventional Radiology clinic [phone] with any questions or concerns.  Follow your previous instructions for our dressing and showering.    Moderate Conscious Sedation, Adult, Care After This sheet gives you information about how to care for yourself after your procedure. Your health care provider may also give you more specific instructions. If you have problems or questions, contact your health care provider. What can I expect after the procedure? After the procedure, it is common to have: Sleepiness for several hours. Impaired judgment for several hours. Difficulty with balance. Vomiting if you eat too soon. Follow these instructions at home: For the time period you were told by your health care provider: Rest. Do not participate in activities where you could fall or become injured. Do not drive or use machinery. Do not drink alcohol. Do not take sleeping pills or medicines that cause drowsiness. Do not make important decisions or sign legal documents. Do not take care of children on your own. Eating and drinking  Follow the diet recommended by your health care provider. Drink enough fluid to keep your urine pale yellow. If you vomit: Drink water, juice, or soup when you can drink without vomiting. Make sure you have little or no nausea before eating solid foods. General instructions Take over-the-counter and prescription medicines only as told by your health care provider. Have a responsible adult stay with you for the time you are told. It is important to have someone help care for you until you are awake and alert. Do not smoke. Keep all follow-up visits as told by your health care provider. This is important. Contact a health care provider if: You are still sleepy or having trouble with balance after 24 hours. You feel light-headed. You keep feeling nauseous or you keep vomiting. You develop a rash. You have a fever. You  have redness or swelling around the IV site. Get help right away if: You have trouble breathing. You have new-onset confusion at home. Summary After the procedure, it is common to feel sleepy, have impaired judgment, or feel nauseous if you eat too soon. Rest after you get home. Know the things you should not do after the procedure. Follow the diet recommended by your health care provider and drink enough fluid to keep your urine pale yellow. Get help right away if you have trouble breathing or new-onset confusion at home. This information is not intended to replace advice given to you by your health care provider. Make sure you discuss any questions you have with your health care provider. Document Revised: 05/12/2019 Document Reviewed: 12/08/2018 Elsevier Patient Education  2023 [employer].

## 2023-01-30 LAB — TYPE AND SCREEN
ABO/RH(D): O POS
Antibody Screen: NEGATIVE
Unit division: 0

## 2023-01-30 LAB — BPAM RBC
Blood Product Expiration Date: 202501272359
Unit Type and Rh: 5100

## 2023-01-31 ENCOUNTER — Other Ambulatory Visit: Payer: Self-pay

## 2023-02-01 ENCOUNTER — Telehealth: Payer: Self-pay

## 2023-02-01 ENCOUNTER — Other Ambulatory Visit (HOSPITAL_COMMUNITY): Payer: Self-pay | Admitting: Radiology

## 2023-02-01 ENCOUNTER — Encounter (HOSPITAL_COMMUNITY): Payer: Self-pay | Admitting: Radiology

## 2023-02-01 DIAGNOSIS — K831 Obstruction of bile duct: Secondary | ICD-10-CM

## 2023-02-01 DIAGNOSIS — K8689 Other specified diseases of pancreas: Secondary | ICD-10-CM

## 2023-02-01 DIAGNOSIS — E46 Unspecified protein-calorie malnutrition: Secondary | ICD-10-CM

## 2023-02-01 DIAGNOSIS — K860 Alcohol-induced chronic pancreatitis: Secondary | ICD-10-CM

## 2023-02-01 NOTE — Telephone Encounter (Signed)
 Copied from CRM [id number]. Topic: Clinical - Home Health Verbal Orders >> Feb 01, 2023 11:46 AM Corean SAUNDERS wrote: Reason for CRM: Harrie from [facility] is requesting verbal orders in order to switch some appointment dates. Please call Harrie at  #[phone]  Returned Dina call to [facility] with verbal ok to change appointment to this week. Harrie states patient requested no visit last week due to having first Chemotherapy appointment. Per PCP verbal ok to approve change in visit/appointment. No further action needed at this time.

## 2023-02-02 NOTE — Telephone Encounter (Signed)
 Returned Harrie call to [facility] with verbal ok to change appointment to this week. Dena states patient requested no visit last week due to having first Chemotherapy appointment. Per PCP verbal ok to approve change in visit/appointment. No further action needed at this time.

## 2023-02-02 NOTE — Telephone Encounter (Signed)
 Copied from CRM [id number]. Topic: General - Call Back - No Documentation >> Feb 02, 2023 10:58 AM Joanell NOVAK wrote: Reason for CRM: [name] is calling from [facility] to confirm that the verbal orders were received for the patient and needs to speak with a nurse to move the appointment from last week to this week. Callback number would be [phone]

## 2023-02-02 NOTE — Telephone Encounter (Signed)
 Copied from CRM [id number]. Topic: General - Call Back - No Documentation >> Feb 02, 2023 10:58 AM Joanell NOVAK wrote: Reason for CRM: [name] is calling from [facility] to confirm that the verbal orders were received for the patient and needs to speak with a nurse to move the appointment from last week to this week. Callback number would be [phone] >> Feb 02, 2023 12:40 PM Tiffany H wrote: Harlene [facility] Phone: [phone] Fax: [phone] Harlene would like orders for CMA Visit change 01/29/23-02/03/23  Rescheduled skilled nursing visit 01/22/23-01/26/23  Plan of Care 01/14/23 All already verbally approved Harlene advised that she will be faxing those over due to transportation issues. Please be advised and anticipate paperwork.

## 2023-02-02 NOTE — Telephone Encounter (Signed)
 Tried to return patient call to advise spoke to [facility] (Dena) 01/31/22 and was given verbal approval from PCP; no answer at patient number no vm to lvm. If patient returns call please advise this was already completed.

## 2023-02-03 ENCOUNTER — Telehealth: Payer: Self-pay

## 2023-02-03 NOTE — Telephone Encounter (Signed)
 Copied from CRM [id number]. Topic: General - Other >> Feb 03, 2023  9:18 AM Mercedes MATSU wrote: Reason for CRM: Powell from [facility] called and stated that the patient refused home health services, that she said she's already doing chemo and physical therapy and she does not want home health services. Powell just wanted the PCP to be aware of this, if provider has any questions feel free to contact Heather @ [phone].  Please see call note from Home Health regarding patient refusal as FYI

## 2023-02-03 NOTE — Telephone Encounter (Signed)
 Copied from CRM [id number]. Topic: General - Other >> Feb 03, 2023  9:18 AM Mercedes MATSU wrote: Reason for CRM: Powell from [facility] called and stated that the patient refused home health services, that she said she's already doing chemo and physical therapy and she does not want home health services. Powell just wanted the PCP to be aware of this, if provider has any questions feel free to contact Heather @ [phone].  PCP has already been advised in previous note today; nothing further needed.

## 2023-02-03 NOTE — Telephone Encounter (Signed)
 Noted and agreed, thank you.

## 2023-02-04 ENCOUNTER — Telehealth: Payer: Self-pay | Admitting: Physician Assistant

## 2023-02-04 ENCOUNTER — Other Ambulatory Visit: Payer: Self-pay | Admitting: Nurse Practitioner

## 2023-02-04 ENCOUNTER — Telehealth: Payer: Self-pay | Admitting: *Deleted

## 2023-02-04 MED ORDER — OXYCODONE HCL 5 MG PO TABS: 5.0000 mg | ORAL_TABLET | ORAL | 0 refills | Status: DC | PRN

## 2023-02-04 NOTE — Telephone Encounter (Signed)
 Patient called asking for Oxycodone refill. Patient reports pharmacy would not send notice to the office stating it was too early for her refill. She was given 50 tablets on 12/30.  Please send refill to [facility] in [place] if appropriate.

## 2023-02-04 NOTE — Telephone Encounter (Signed)
 FYI, see message below.   Copied from CRM [id number]. Topic: Clinical - Home Health Verbal Orders >> Feb 04, 2023 11:54 AM Joanell NOVAK wrote: Caller/Agency: [facility]  Callback Number: [phone] Service Requested: Physical Therapy Frequency: No longer needs  Any new concerns about the patient? No  Would like to inform Dr. Kathrene that the pt stated that she feels like she is doing fine and no longer needs home health services for physical therapy

## 2023-02-04 NOTE — Telephone Encounter (Signed)
 Document Home Health Certificate (Order ID [id number]), to be filled out by provider. Patient requested to send it back via Fax within 5-days. Document is located in providers tray at front office.Please advise

## 2023-02-04 NOTE — Telephone Encounter (Signed)
 Kathryn Underwood.  I filled this for 7 days, which should last her until her appointment with Dr. Mosetta Putt. That is on 15/2025. She is probably going to need a referral to see Lowella Bandy, NP. -HB

## 2023-02-05 ENCOUNTER — Telehealth: Payer: Self-pay

## 2023-02-05 NOTE — Telephone Encounter (Signed)
 Noted.

## 2023-02-05 NOTE — Telephone Encounter (Signed)
Placed in provider box for review and completion.

## 2023-02-05 NOTE — Telephone Encounter (Signed)
 Copied from CRM [id number]. Topic: General - Other >> Feb 05, 2023 10:23 AM Rosina BIRCH wrote: Reason for CRM: Harlene from [facility] called wanting to know if is was ok to fax an order over and let the provider sign it and fax it back CB# [phone]  Returned Harlene call and advised fax number to receive orders that needed to be signed. No further action needed at this time

## 2023-02-08 NOTE — Assessment & Plan Note (Signed)
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 -She was readmitted to hospital for recurrent nausea and vomiting, likely partial gastric outlet obstruction from pancreatic tumor.  She is able to tolerate full liquid diet, she underwet duodenal stent placement on 01/06/2023 -she started chemo gemcitabine  and abraxane  on 01/26/2023

## 2023-02-09 NOTE — Telephone Encounter (Signed)
 Paperwork completed and faxed back for patient

## 2023-02-10 ENCOUNTER — Inpatient Hospital Stay: Payer: Medicaid Other | Admitting: Dietician

## 2023-02-10 ENCOUNTER — Inpatient Hospital Stay: Payer: Medicaid Other | Attending: Hematology

## 2023-02-10 ENCOUNTER — Inpatient Hospital Stay: Payer: Medicaid Other

## 2023-02-10 ENCOUNTER — Inpatient Hospital Stay (HOSPITAL_BASED_OUTPATIENT_CLINIC_OR_DEPARTMENT_OTHER): Payer: Medicaid Other | Admitting: Hematology

## 2023-02-10 ENCOUNTER — Encounter: Payer: Self-pay | Admitting: Hematology

## 2023-02-10 VITALS — BP 115/74 | HR 112 | Temp 98.0°F | Resp 16 | Wt 107.2 lb

## 2023-02-10 DIAGNOSIS — C258 Malignant neoplasm of overlapping sites of pancreas: Secondary | ICD-10-CM | POA: Insufficient documentation

## 2023-02-10 DIAGNOSIS — C259 Malignant neoplasm of pancreas, unspecified: Secondary | ICD-10-CM

## 2023-02-10 DIAGNOSIS — D509 Iron deficiency anemia, unspecified: Secondary | ICD-10-CM | POA: Diagnosis present

## 2023-02-10 DIAGNOSIS — E860 Dehydration: Secondary | ICD-10-CM

## 2023-02-10 DIAGNOSIS — Z5111 Encounter for antineoplastic chemotherapy: Secondary | ICD-10-CM | POA: Insufficient documentation

## 2023-02-10 LAB — CMP (CANCER CENTER ONLY)
ALT: 24 U/L (ref 0–44)
AST: 25 U/L (ref 15–41)
Albumin: 3.2 g/dL — ABNORMAL LOW (ref 3.5–5.0)
Alkaline Phosphatase: 482 U/L — ABNORMAL HIGH (ref 38–126)
Anion gap: 3 — ABNORMAL LOW (ref 5–15)
BUN: 18 mg/dL (ref 8–23)
CO2: 28 mmol/L (ref 22–32)
Calcium: 8.6 mg/dL — ABNORMAL LOW (ref 8.9–10.3)
Chloride: 104 mmol/L (ref 98–111)
Creatinine: 0.36 mg/dL — ABNORMAL LOW (ref 0.44–1.00)
GFR, Estimated: >60 mL/min (ref >60)
Glucose, Bld: 129 mg/dL — ABNORMAL HIGH (ref 70–99)
Potassium: 4.2 mmol/L (ref 3.5–5.1)
Sodium: 135 mmol/L (ref 135–145)
Total Bilirubin: 0.4 mg/dL (ref 0.0–1.2)
Total Protein: 5.8 g/dL — ABNORMAL LOW (ref 6.5–8.1)

## 2023-02-10 LAB — CBC WITH DIFFERENTIAL (CANCER CENTER ONLY)
Abs Immature Granulocytes: 0.05 10*3/uL (ref 0.00–0.07)
Basophils Absolute: 0.1 10*3/uL (ref 0.0–0.1)
Basophils Relative: 1 %
Eosinophils Absolute: 0.2 10*3/uL (ref 0.0–0.5)
Eosinophils Relative: 3 %
HCT: 24.2 % — ABNORMAL LOW (ref 36.0–46.0)
Hemoglobin: 7.7 g/dL — ABNORMAL LOW (ref 12.0–15.0)
Immature Granulocytes: 1 %
Lymphocytes Relative: 26 %
Lymphs Abs: 2 10*3/uL (ref 0.7–4.0)
MCH: 28.5 pg (ref 26.0–34.0)
MCHC: 31.8 g/dL (ref 30.0–36.0)
MCV: 89.6 fL (ref 80.0–100.0)
Monocytes Absolute: 1.3 10*3/uL — ABNORMAL HIGH (ref 0.1–1.0)
Monocytes Relative: 17 %
Neutro Abs: 4 10*3/uL (ref 1.7–7.7)
Neutrophils Relative %: 52 %
Platelet Count: 756 10*3/uL — ABNORMAL HIGH (ref 150–400)
RBC: 2.7 MIL/uL — ABNORMAL LOW (ref 3.87–5.11)
RDW: 15.9 % — ABNORMAL HIGH (ref 11.5–15.5)
WBC Count: 7.6 10*3/uL (ref 4.0–10.5)
nRBC: 0 % (ref 0.0–0.2)

## 2023-02-10 MED ORDER — HEPARIN SOD (PORK) LOCK FLUSH 100 UNIT/ML IV SOLN
500.0000 [IU] | Freq: Once | INTRAVENOUS | Status: AC | PRN
  Administered 2023-02-10: 500 [IU]

## 2023-02-10 MED ORDER — SODIUM CHLORIDE 0.9 % IV SOLN: INTRAVENOUS | Status: DC

## 2023-02-10 MED ORDER — SODIUM CHLORIDE 0.9 % IV SOLN
1000.0000 mg/m2 | Freq: Once | INTRAVENOUS | Status: AC
  Administered 2023-02-10: 1406 mg via INTRAVENOUS
  Filled 2023-02-10: qty 36.98

## 2023-02-10 MED ORDER — OXYCODONE HCL 5 MG PO TABS: 5.0000 mg | ORAL_TABLET | ORAL | 0 refills | Status: DC | PRN

## 2023-02-10 MED ORDER — IRON SUCROSE 20 MG/ML IV SOLN
200.0000 mg | Freq: Once | INTRAVENOUS | Status: AC
  Administered 2023-02-10: 200 mg via INTRAVENOUS
  Filled 2023-02-10: qty 10

## 2023-02-10 MED ORDER — SODIUM CHLORIDE 0.9% FLUSH
10.0000 mL | Freq: Once | INTRAVENOUS | Status: AC | PRN
Start: 2023-02-10 — End: 2023-02-10
  Administered 2023-02-10: 10 mL

## 2023-02-10 MED ORDER — PROCHLORPERAZINE MALEATE 10 MG PO TABS
10.0000 mg | ORAL_TABLET | Freq: Once | ORAL | Status: AC
  Administered 2023-02-10: 10 mg via ORAL
  Filled 2023-02-10: qty 1

## 2023-02-10 MED ORDER — PACLITAXEL PROTEIN-BOUND CHEMO INJECTION 100 MG
125.0000 mg/m2 | Freq: Once | INTRAVENOUS | Status: AC
  Administered 2023-02-10: 175 mg via INTRAVENOUS
  Filled 2023-02-10: qty 35

## 2023-02-10 MED ORDER — SODIUM CHLORIDE 0.9% FLUSH
10.0000 mL | INTRAVENOUS | Status: DC | PRN
  Administered 2023-02-10: 10 mL

## 2023-02-10 NOTE — Progress Notes (Signed)
 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 -She was readmitted to hospital for recurrent nausea and vomiting, likely partial gastric outlet obstruction from pancreatic tumor.  She is able to tolerate full liquid diet, she underwet duodenal stent placement on 01/06/2023 -she started chemo gemcitabine  and abraxane  on 01/26/2023    Assessment and Plan    Pancreatic Cancer Follow-up for pancreatic cancer. Received one dose of chemotherapy before New Year, tolerated well without significant side effects. Gained five pounds since the last visit. Currently on a low-fiber diet. Pain managed with oxycodone  every 4-5 hours and acetaminophen  in between. Night sweats likely due to stress, cancer, chemotherapy, and poor nutrition. Hemoglobin is 7.7, ferritin is low, indicating iron  deficiency anemia. Surgery is considered the only curative option, dependent on chemotherapy response. Discussed IV iron  for anemia, generally safe but can cause mild allergic reactions (rash, itchiness, swelling) and severe reactions (syncope). - Proceed with chemotherapy today - Order IV iron  infusion pending insurance approval - Refer to Dr. Leighton Punches for surgical consultation - Order CT scan in March -  Order CA 19-9 tumor marker every three weeks - Refer to genetics for testing - Refer to dietician for nutritional guidance - Encourage hydration with 40-60 ounces of water daily  Iron  Deficiency Anemia Hemoglobin is 7.7, ferritin is low, indicating iron  deficiency anemia. Discussed IV iron  to boost blood counts and help with anemia. Explained potential side effects of IV iron , including mild allergic reactions (rash, itchiness, swelling) and severe reactions (syncope). - Order IV iron  infusion pending insurance approval - Premedicate with diphenhydramine  or loratadine  if needed  Pain Management Pain primarily in the back and sides, managed with oxycodone  every 4-5 hours and acetaminophen  in between. No need for pain medication at night. Discussed the need for a refill of oxycodone  in about ten days. - Refill oxycodone  prescription  Night Sweats Night sweats likely due to stress, cancer, chemotherapy, and poor nutrition. Advised to increase water intake to 40-60 ounces daily. - Encourage hydration with 40-60 ounces of water daily  General Health Maintenance Discussed the importance of hydration and balanced nutrition. Referred to dietician for further guidance. - Refer to dietician for nutritional guidance  PLAN -Lab reviewed, adequate for treatment, will proceed cycle 2-day 1 gemcitabine  and Abraxane  today -Lab, follow-up and chemo in a week -Genetic referral -Will refer her to pancreatobiliary surgeon Dr. Leighton Punches -Start IV Venofer  200 mg today, and continue with each dose of chemo for 4 more doses - Follow-up with IR on February 2nd for gallbladder drainage.         SUMMARY OF ONCOLOGIC HISTORY: Oncology History  Pancreatic adenocarcinoma (HCC)  12/12/2022 Cancer Staging   Staging form: Exocrine Pancreas, AJCC 8th Edition - Clinical stage from 12/12/2022: Stage IIA (cT3, cN0, cM0) - Signed by Sonja [place], MD on 12/28/2022 Total positive nodes: 0   12/16/2022 Initial  Diagnosis    Pancreatic adenocarcinoma (HCC)   01/26/2023 -  Chemotherapy   Patient is on Treatment Plan : PANCREATIC Abraxane  D1,8,15 + Gemcitabine  D1,8,15 q28d        Discussed the use of AI scribe software for clinical note transcription with the patient, who gave verbal consent to proceed.  History of Present Illness   The patient, with a history of pancreatic cancer, is currently undergoing chemotherapy. She reports tolerating the treatment well, with no significant side effects. She has been maintaining a good appetite and has gained five pounds since the last visit. She is eating a variety of foods, including some red meat and chicken, despite some dietary restrictions.  The patient is experiencing night sweats, waking up two to three times a night. She sleeps on a reclining couch in the living room due to this issue. She also reports pain, primarily in the back and sides, which is managed with oxycodone  every four to five hours and Tylenol  in between doses. The patient does not usually need to wake up to take pain medication at night.  The patient's hemoglobin is currently at 7.7, and she is not taking oral iron . She has not had a blood transfusion since her last visit.         All other systems were reviewed with the patient and are negative.  MEDICAL HISTORY:  Past Medical History:  Diagnosis Date   Alcohol abuse    Anxiety    Chickenpox    Depression    Neuromuscular disorder (HCC)    neuropathy   Pancreatic cancer (HCC)    Substance abuse (HCC)     SURGICAL HISTORY: Past Surgical History:  Procedure Laterality Date   BIOPSY  12/10/2022   Procedure: BIOPSY;  Surgeon: Mansouraty, Albino Alu., MD;  Location: [facility];  Service: Gastroenterology;;   CESAREAN SECTION     DUODENAL STENT PLACEMENT N/A 01/06/2023   Procedure: DUODENAL STENT PLACEMENT;  Surgeon: Normie Becton., MD;  Location: [facility];  Service: Gastroenterology;  Laterality: N/A;    ESOPHAGOGASTRODUODENOSCOPY N/A 08/14/2021   Procedure: ESOPHAGOGASTRODUODENOSCOPY (EGD);  Surgeon: Janel Medford, MD;  Location: [facility];  Service: Gastroenterology;  Laterality: N/A;   ESOPHAGOGASTRODUODENOSCOPY N/A 12/12/2022   Procedure: ESOPHAGOGASTRODUODENOSCOPY (EGD);  Surgeon: Normie Becton., MD;  Location: [facility];  Service: Gastroenterology;  Laterality: N/A;   ESOPHAGOGASTRODUODENOSCOPY (EGD) WITH PROPOFOL  N/A 12/10/2022   Procedure: ESOPHAGOGASTRODUODENOSCOPY (EGD) WITH PROPOFOL ;  Surgeon: Brice Campi Albino Alu., MD;  Location: [facility];  Service: Gastroenterology;  Laterality: N/A;   ESOPHAGOGASTRODUODENOSCOPY (EGD) WITH PROPOFOL  N/A 01/06/2023   Procedure: ESOPHAGOGASTRODUODENOSCOPY (EGD) WITH PROPOFOL ;  Surgeon: Brice Campi Albino Alu., MD;  Location: [facility];  Service: Gastroenterology;  Laterality: N/A;  with duodenal stent placement   EUS N/A 08/14/2021   Procedure: UPPER ENDOSCOPIC ULTRASOUND (EUS) RADIAL;  Surgeon: Janel Medford, MD;  Location: [facility];  Service: Gastroenterology;  Laterality: N/A;   EUS N/A 12/12/2022   Procedure: UPPER ENDOSCOPIC ULTRASOUND (EUS) LINEAR;  Surgeon: Normie Becton., MD;  Location: [facility];  Service: Gastroenterology;  Laterality: N/A;   EUS N/A 01/06/2023   Procedure: FULL UPPER ENDOSCOPIC ULTRASOUND (EUS) RADIAL;  Surgeon: Brice Campi Albino Alu., MD;  Location: [facility];  Service: Gastroenterology;  Laterality: N/A;   FINE NEEDLE ASPIRATION N/A 08/14/2021   Procedure: FINE NEEDLE ASPIRATION (FNA) LINEAR;  Surgeon: Janel Medford, MD;  Location: [facility];  Service: Gastroenterology;  Laterality: N/A;   FINE NEEDLE ASPIRATION N/A 12/12/2022   Procedure: FINE NEEDLE  ASPIRATION (FNA) LINEAR;  Surgeon: Brice Campi Albino Alu., MD;  Location: [facility];  Service: Gastroenterology;  Laterality: N/A;   FINE NEEDLE ASPIRATION  01/06/2023   Procedure: FINE NEEDLE ASPIRATION;  Surgeon:  Brice Campi Albino Alu., MD;  Location: [facility];  Service: Gastroenterology;;   IR EXCHANGE BILIARY DRAIN  01/29/2023   IR IMAGING GUIDED PORT INSERTION  12/18/2022   IR PERC CHOLECYSTOSTOMY  12/13/2022   TONSILLECTOMY      I have reviewed the social history and family history with the patient and they are unchanged from previous note.  ALLERGIES:  has no known allergies.  MEDICATIONS:  Current Outpatient Medications  Medication Sig Dispense Refill   acetaminophen  (TYLENOL ) 500 MG tablet Take 500-1,000 mg by mouth every 6 (six) hours as needed for moderate pain (pain score 4-6).     apixaban  (ELIQUIS ) 5 MG TABS tablet Take 1 tablet (5 mg total) by mouth 2 (two) times daily. 60 tablet 0   Ensure Max Protein (ENSURE MAX PROTEIN) LIQD Take 330 mLs (11 oz total) by mouth 2 (two) times daily.     gabapentin  (NEURONTIN ) 300 MG capsule Take 2 capsules (600 mg total) by mouth 2 (two) times daily. (Patient taking differently: Take 900 mg by mouth at bedtime.) 60 capsule 0   lidocaine -prilocaine  (EMLA ) cream Apply to affected area once 30 g 3   mirtazapine  (REMERON ) 45 MG tablet Take 45 mg by mouth at bedtime.     ondansetron  (ZOFRAN ) 8 MG tablet Take 1 tablet (8 mg total) by mouth every 8 (eight) hours as needed for nausea or vomiting. 30 tablet 1   oxyCODONE  (OXY IR/ROXICODONE ) 5 MG immediate release tablet Take 1 tablet (5 mg total) by mouth every 4 (four) hours as needed for up to 7 days for moderate pain (pain score 4-6). 42 tablet 0   pantoprazole  (PROTONIX ) 40 MG tablet Take 1 tablet (40 mg total) by mouth 2 (two) times daily. 180 tablet 0   prochlorperazine  (COMPAZINE ) 10 MG tablet Take 1 tablet (10 mg total) by mouth every 6 (six) hours as needed for nausea or vomiting. 30 tablet 1   QUEtiapine  (SEROQUEL ) 100 MG tablet Take 200 mg by mouth at bedtime.     sodium chloride  flush (NS) 0.9 % SOLN Use 5 - 10 mLs to flush abdominal drain once daily as directed 300 mL 3   sucralfate  (CARAFATE ) 1  GM/10ML suspension TAKE 10 MLS (1 G TOTAL) BY MOUTH 4 (FOUR) TIMES DAILY - WITH MEALS AND AT BEDTIME. 420 mL 1   Vitamin D , Ergocalciferol , (DRISDOL ) 1.25 MG (50000 UNIT) CAPS capsule Take 1 capsule (50,000 Units total) by mouth every 7 (seven) days. 5 capsule 6   No current facility-administered medications for this visit.    PHYSICAL EXAMINATION: ECOG PERFORMANCE STATUS: 2 - Symptomatic, <50% confined to bed  Vitals:   02/10/23 0905  BP: 115/74  Pulse: (!) 112  Resp: 16  Temp: 98 F (36.7 C)  SpO2: 99%   Wt Readings from Last 3 Encounters:  02/10/23 107 lb 3.2 oz (48.6 kg)  01/29/23 101 lb 10.1 oz (46.1 kg)  01/25/23 101 lb 11.2 oz (46.1 kg)     GENERAL:alert, no distress and comfortable SKIN: skin color, texture, turgor are normal, no rashes or significant lesions EYES: normal, Conjunctiva are pink and non-injected, sclera clear NECK: supple, thyroid  normal size, non-tender, without nodularity LYMPH:  no palpable lymphadenopathy in the cervical, axillary  LUNGS: clear to auscultation and percussion with normal breathing effort  HEART: regular rate & rhythm and no murmurs and no lower extremity edema ABDOMEN:abdomen soft, non-tender and normal bowel sounds Musculoskeletal:no cyanosis of digits and no clubbing  NEURO: alert & oriented x 3 with fluent speech, no focal motor/sensory deficits    LABORATORY DATA:  I have reviewed the data as listed    Latest Ref Rng & Units 02/10/2023    9:03 AM 01/29/2023    8:55 AM 01/26/2023   11:07 AM  CBC  WBC 4.0 - 10.5 K/uL 7.6  7.9  9.9   Hemoglobin 12.0 - 15.0 g/dL 7.7  7.8  8.0   Hematocrit 36.0 - 46.0 % 24.2  24.4  23.6   Platelets 150 - 400 K/uL 756  671  804         Latest Ref Rng & Units 01/29/2023    8:55 AM 01/26/2023   11:07 AM 01/25/2023    1:13 PM  CMP  Glucose 70 - 99 mg/dL 147  829  562   BUN 8 - 23 mg/dL 20  16  16    Creatinine 0.44 - 1.00 mg/dL 1.30  8.65  7.84   Sodium 135 - 145 mmol/L 133  135  137    Potassium 3.5 - 5.1 mmol/L 3.9  4.6  3.7   Chloride 98 - 111 mmol/L 100  101  103   CO2 22 - 32 mmol/L 27  29  28    Calcium 8.9 - 10.3 mg/dL 8.4  8.5  8.3   Total Protein 6.5 - 8.1 g/dL 6.0  5.7  5.5   Total Bilirubin 0.0 - 1.2 mg/dL 0.9  0.5  0.5   Alkaline Phos 38 - 126 U/L 511  729  741   AST 15 - 41 U/L 22  28  30    ALT 0 - 44 U/L 32  42  43       RADIOGRAPHIC STUDIES: I have personally reviewed the radiological images as listed and agreed with the findings in the report. No results found.    Orders Placed This Encounter  Procedures   CBC with Differential (Cancer Center Only)    Standing Status:   Future    Expected Date:   03/01/2023    Expiration Date:   02/29/2024   CMP (Cancer Center only)    Standing Status:   Future    Expected Date:   03/01/2023    Expiration Date:   02/29/2024   CBC with Differential (Cancer Center Only)    Standing Status:   Future    Expected Date:   03/08/2023    Expiration Date:   03/07/2024   CMP (Cancer Center only)    Standing Status:   Future    Expected Date:   03/08/2023    Expiration Date:   03/07/2024   CBC with Differential (Cancer Center Only)    Standing Status:   Future    Expected Date:   03/29/2023    Expiration Date:   03/28/2024   CMP (Cancer Center only)    Standing Status:   Future    Expected Date:   03/29/2023    Expiration Date:   03/28/2024   CBC with Differential (Cancer Center Only)    Standing Status:   Future    Expected Date:   04/05/2023    Expiration Date:   04/04/2024   CMP (Cancer Center only)    Standing Status:   Future    Expected Date:   04/05/2023    Expiration  Date:   04/04/2024   CBC with Differential (Cancer Center Only)    Standing Status:   Future    Expected Date:   04/12/2023    Expiration Date:   04/11/2024   CMP (Cancer Center only)    Standing Status:   Future    Expected Date:   04/12/2023    Expiration Date:   04/11/2024   Cancer antigen 19-9    Standing Status:   Standing    Number of Occurrences:    20    Expiration Date:   02/10/2024   Ambulatory referral to General Surgery    Referral Priority:   Routine    Referral Type:   Surgical    Referral Reason:   Specialty Services Required    Requested Specialty:   General Surgery    Number of Visits Requested:   1   Ambulatory referral to Genetics    Referral Priority:   Routine    Referral Type:   Consultation    Referral Reason:   Specialty Services Required    Number of Visits Requested:   1   All questions were answered. The patient knows to call the clinic with any problems, questions or concerns. No barriers to learning was detected. The total time spent in the appointment was 40 minutes.     Sonja Donora, MD 02/10/2023

## 2023-02-10 NOTE — Patient Instructions (Signed)
 [facility] - A DEPT OF [facility]  [facility]PITAL  Discharge Instructions: Thank you for choosing [facility] to provide your oncology and hematology care.   If you have a lab appointment with the Cancer Center, please go directly to the Cancer Center and check in at the registration area.   Wear comfortable clothing and clothing appropriate for easy access to any Portacath or PICC line.   We strive to give you quality time with your provider. You may need to reschedule your appointment if you arrive late (15 or more minutes).  Arriving late affects you and other patients whose appointments are after yours.  Also, if you miss three or more appointments without notifying the office, you may be dismissed from the clinic at the provider's discretion.      For prescription refill requests, have your pharmacy contact our office and allow 72 hours for refills to be completed.    Today you received the following chemotherapy and/or immunotherapy agents abraxane  gemzar       To help prevent nausea and vomiting after your treatment, we encourage you to take your nausea medication as directed.  BELOW ARE SYMPTOMS THAT SHOULD BE REPORTED IMMEDIATELY: *FEVER GREATER THAN 100.4 F (38 C) OR HIGHER *CHILLS OR SWEATING *NAUSEA AND VOMITING THAT IS NOT CONTROLLED WITH YOUR NAUSEA MEDICATION *UNUSUAL SHORTNESS OF BREATH *UNUSUAL BRUISING OR BLEEDING *URINARY PROBLEMS (pain or burning when urinating, or frequent urination) *BOWEL PROBLEMS (unusual diarrhea, constipation, pain near the anus) TENDERNESS IN MOUTH AND THROAT WITH OR WITHOUT PRESENCE OF ULCERS (sore throat, sores in mouth, or a toothache) UNUSUAL RASH, SWELLING OR PAIN  UNUSUAL VAGINAL DISCHARGE OR ITCHING   Items with * indicate a potential emergency and should be followed up as soon as possible or go to the Emergency Department if any problems should occur.  Please show the CHEMOTHERAPY ALERT CARD or  IMMUNOTHERAPY ALERT CARD at check-in to the Emergency Department and triage nurse.  Should you have questions after your visit or need to cancel or reschedule your appointment, please contact [facility] - A DEPT [facility]  Dept: [phone]  and follow the prompts.  Office hours are 8:00 a.m. to 4:30 p.m. Monday - Friday. Please note that voicemails left after 4:00 p.m. may not be returned until the following business day.  We are closed weekends and major holidays. You have access to a nurse at all times for urgent questions. Please call the main number to the clinic Dept: [phone] and follow the prompts.   For any non-urgent questions, you may also contact your provider using MyChart. We now offer e-Visits for anyone 27 and older to request care online for non-urgent symptoms. For details visit [email or web address].   Also download the MyChart app! Go to the app store, search "MyChart", open the app, select [place], and log in with your MyChart username and password.

## 2023-02-10 NOTE — Progress Notes (Signed)
 Nutrition Assessment   Reason for Assessment: Referral (poor po, wt loss)   ASSESSMENT: 63 year old female with stage II pancreatic cancer. She is receiving gemcitabine  + abraxane  q28d.   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 Close friend is present at visit. Patient reports good appetite. States she loves to eat. Recalls small meals throughout the day (cereal with whole milk, frozen meals, pasta, soup, chicken, steak. Patient denies abdominal pain with intake. Patient is drinking 2 Ensure Plus (350 kcal, 20 g). She says she needs to increase water - currently drinking 2 bottles. Patient also drinks cranberry/apple/white grape juice. She denies nausea, vomiting, diarrhea, constipation. Stools are light tan in color. Denies EPI symptoms.   Nutrition Focused Physical Exam: Severe fat and muscle depletions noted per 12/9 inpt RD exam    Medications: eliquis , gabapentin , remeron , zofran , roxicodone , protonix , seroquel , carafate , drisdol    Labs: Hgb 7.7 (trending down)   Anthropometrics: Weights have increased from 101 lb 10.1 oz on 1/3   Height: 5'3" Weight: 107 lb 3.2 oz  UBW: 146 lb (08/2021) BMI: 18.99    NUTRITION DIAGNOSIS: Unintended wt loss related to social/environmental circumstances (alcohol abuse) and chronic illness (pancreatic cancer) as evidenced by 27% decrease from usual weight in the last 17 months   MALNUTRITION DIAGNOSIS: Severe malnutrition diagnosed 12/9 during hospital admission - ongoing    INTERVENTION:   Continue low residue diet, reviewed high and low fiber foods - handout with list of foods provided Continue strategies for increasing calories and protein with small frequent meals and snacks - snack ideas + soft moist high protein foods Continue drinking 2 Ensure Plus HP/equivalent - coupons + samples of Ensure complete Educated on importance of hydration - pt will work to increase water 40-60 ounces per MD Contact information provided     MONITORING, EVALUATION, GOAL: Patient will tolerate increased calories and protein to minimize further wt loss/support wt gain   Next Visit: To be scheduled in collaboration with upcoming [facility] appointments

## 2023-02-10 NOTE — Progress Notes (Signed)
 Per Dr. Maryalice Smaller, ok to proceed with treatment with low hemoglobin and high heart rate.

## 2023-02-15 ENCOUNTER — Encounter: Payer: Self-pay | Admitting: Hematology

## 2023-02-15 ENCOUNTER — Telehealth: Payer: Self-pay | Admitting: Genetic Counselor

## 2023-02-15 NOTE — Telephone Encounter (Signed)
Scheduled appointments per scheduling message. Patient is aware of the made appointments and is active on MyChart and will be mailed an appointment reminder.

## 2023-02-16 ENCOUNTER — Other Ambulatory Visit: Payer: Self-pay

## 2023-02-16 NOTE — Assessment & Plan Note (Signed)
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 -She was readmitted to hospital for recurrent nausea and vomiting, likely partial gastric outlet obstruction from pancreatic tumor.  She is able to tolerate full liquid diet, she underwet duodenal stent placement on 01/06/2023 -she started chemo gemcitabine and abraxane on 01/26/2023  -her scan was reviewed by Dr. Freida Busman and her cancer was deemed to be unresectable

## 2023-02-17 ENCOUNTER — Other Ambulatory Visit: Payer: Self-pay | Admitting: *Deleted

## 2023-02-17 ENCOUNTER — Inpatient Hospital Stay: Payer: Medicaid Other

## 2023-02-17 ENCOUNTER — Inpatient Hospital Stay (HOSPITAL_BASED_OUTPATIENT_CLINIC_OR_DEPARTMENT_OTHER): Payer: Medicaid Other | Admitting: Hematology

## 2023-02-17 ENCOUNTER — Other Ambulatory Visit (HOSPITAL_COMMUNITY): Payer: Self-pay

## 2023-02-17 ENCOUNTER — Encounter: Payer: Self-pay | Admitting: Hematology

## 2023-02-17 VITALS — BP 95/60 | HR 113 | Temp 97.3°F | Resp 17 | Wt 107.7 lb

## 2023-02-17 DIAGNOSIS — Z5111 Encounter for antineoplastic chemotherapy: Secondary | ICD-10-CM | POA: Diagnosis not present

## 2023-02-17 DIAGNOSIS — C259 Malignant neoplasm of pancreas, unspecified: Secondary | ICD-10-CM

## 2023-02-17 DIAGNOSIS — E860 Dehydration: Secondary | ICD-10-CM

## 2023-02-17 DIAGNOSIS — D649 Anemia, unspecified: Secondary | ICD-10-CM

## 2023-02-17 LAB — CMP (CANCER CENTER ONLY)
ALT: 43 U/L (ref 0–44)
AST: 35 U/L (ref 15–41)
Albumin: 3.3 g/dL — ABNORMAL LOW (ref 3.5–5.0)
Alkaline Phosphatase: 687 U/L — ABNORMAL HIGH (ref 38–126)
Anion gap: 4 — ABNORMAL LOW (ref 5–15)
BUN: 17 mg/dL (ref 8–23)
CO2: 28 mmol/L (ref 22–32)
Calcium: 8.9 mg/dL (ref 8.9–10.3)
Chloride: 102 mmol/L (ref 98–111)
Creatinine: 0.34 mg/dL — ABNORMAL LOW (ref 0.44–1.00)
GFR, Estimated: >60 mL/min (ref >60)
Glucose, Bld: 133 mg/dL — ABNORMAL HIGH (ref 70–99)
Potassium: 4.3 mmol/L (ref 3.5–5.1)
Sodium: 134 mmol/L — ABNORMAL LOW (ref 135–145)
Total Bilirubin: 0.7 mg/dL (ref 0.0–1.2)
Total Protein: 5.9 g/dL — ABNORMAL LOW (ref 6.5–8.1)

## 2023-02-17 LAB — CBC WITH DIFFERENTIAL (CANCER CENTER ONLY)
Abs Immature Granulocytes: 0.1 10*3/uL — ABNORMAL HIGH (ref 0.00–0.07)
Basophils Absolute: 0.1 10*3/uL (ref 0.0–0.1)
Basophils Relative: 1 %
Eosinophils Absolute: 0.1 10*3/uL (ref 0.0–0.5)
Eosinophils Relative: 1 %
HCT: 23.2 % — ABNORMAL LOW (ref 36.0–46.0)
Hemoglobin: 7.2 g/dL — ABNORMAL LOW (ref 12.0–15.0)
Immature Granulocytes: 1 %
Lymphocytes Relative: 22 %
Lymphs Abs: 1.9 10*3/uL (ref 0.7–4.0)
MCH: 28.5 pg (ref 26.0–34.0)
MCHC: 31 g/dL (ref 30.0–36.0)
MCV: 91.7 fL (ref 80.0–100.0)
Monocytes Absolute: 0.6 10*3/uL (ref 0.1–1.0)
Monocytes Relative: 7 %
Neutro Abs: 5.8 10*3/uL (ref 1.7–7.7)
Neutrophils Relative %: 68 %
Platelet Count: 523 10*3/uL — ABNORMAL HIGH (ref 150–400)
RBC: 2.53 MIL/uL — ABNORMAL LOW (ref 3.87–5.11)
RDW: 16.7 % — ABNORMAL HIGH (ref 11.5–15.5)
WBC Count: 8.6 10*3/uL (ref 4.0–10.5)
nRBC: 0.3 % — ABNORMAL HIGH (ref 0.0–0.2)

## 2023-02-17 LAB — PREPARE RBC (CROSSMATCH)

## 2023-02-17 MED ORDER — SODIUM CHLORIDE 0.9% FLUSH
10.0000 mL | INTRAVENOUS | Status: DC | PRN
  Administered 2023-02-17: 10 mL

## 2023-02-17 MED ORDER — SODIUM CHLORIDE 0.9 % IV SOLN
1000.0000 mg/m2 | Freq: Once | INTRAVENOUS | Status: AC
  Administered 2023-02-17: 1406 mg via INTRAVENOUS
  Filled 2023-02-17: qty 36.98

## 2023-02-17 MED ORDER — PROCHLORPERAZINE MALEATE 10 MG PO TABS
10.0000 mg | ORAL_TABLET | Freq: Once | ORAL | Status: AC
Start: 2023-02-17 — End: 2023-02-17
  Administered 2023-02-17: 10 mg via ORAL
  Filled 2023-02-17: qty 1

## 2023-02-17 MED ORDER — SODIUM CHLORIDE 0.9% FLUSH
10.0000 mL | Freq: Once | INTRAVENOUS | Status: AC | PRN
  Administered 2023-02-17: 10 mL

## 2023-02-17 MED ORDER — HEPARIN SOD (PORK) LOCK FLUSH 100 UNIT/ML IV SOLN
500.0000 [IU] | Freq: Once | INTRAVENOUS | Status: AC | PRN
  Administered 2023-02-17: 500 [IU]

## 2023-02-17 MED ORDER — PACLITAXEL PROTEIN-BOUND CHEMO INJECTION 100 MG
125.0000 mg/m2 | Freq: Once | INTRAVENOUS | Status: AC
  Administered 2023-02-17: 175 mg via INTRAVENOUS
  Filled 2023-02-17: qty 35

## 2023-02-17 MED ORDER — SODIUM CHLORIDE 0.9 % IV SOLN: INTRAVENOUS | Status: DC

## 2023-02-17 MED ORDER — OXYCODONE HCL 5 MG PO TABS
5.0000 mg | ORAL_TABLET | Freq: Four times a day (QID) | ORAL | 0 refills | Status: DC | PRN
  Filled 2023-02-17 – 2023-02-18 (×2): qty 100, 25d supply, fill #0

## 2023-02-17 NOTE — Progress Notes (Signed)
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 -She was readmitted to hospital for recurrent nausea and vomiting, likely partial gastric outlet obstruction from pancreatic tumor.  She is able to tolerate full liquid diet, she underwet duodenal stent placement on 01/06/2023 -she started chemo gemcitabine and abraxane on 01/26/2023  -her scan was reviewed by Dr. Freida Busman and her cancer was deemed to be unresectable    Assessment and Plan    Pancreatic Cancer Pancreatic cancer with vascular involvement, precluding surgical resection. Chemotherapy and radiation discussed as non-curative but aimed at disease control and life prolongation. Second opinion at larger cancer centers like [facility] or [facility] considered. Current chemotherapy is tolerated with manageable side effects (hair loss, occasional nausea controlled with medication). Weight and appetite are stable. - Continue chemotherapy as scheduled - Order CT scan for late February or early March to assess treatment response - Schedule genetic testing for BRCA1, BRCA2 mutations - Consider referral to larger cancer center for second opinion if desired.  Patient was very disappointed and  upset after we discussed that her cancer is likely not curable, and had many questions.  I answered to the best of my ability.  Anemia Hemoglobin level decreased to 7.2 from 7.7, likely secondary to chemotherapy. Blood transfusion may be required. - Schedule blood transfusion for two units of blood in the next few days - Monitor hemoglobin levels regularly  Pain Management Currently on oxycodone, taking approximately four tablets per day. Requires prescription refill due to partial fill by pharmacy. - Refill oxycodone prescription for 100 tablets at [facility] Maintenance Bleeding mole and possible cyst noted. Mammogram overdue. On antidepressant medication. - Monitor mole and lump; consider dermatology referral if changes occur - Schedule mammogram - Continue antidepressant medication  Plan -Lab reviewed, adequate for treatment, will proceed cycle 2-day 8 chemo today -Follow-up in 2 weeks before cycle 3-day 1 chemo  - Coordinate with social worker for additional support if needed.  -will order restaging CT on next visit to be done after cycle 3  -called lab to make sure that     SUMMARY OF ONCOLOGIC HISTORY: Oncology History  Pancreatic adenocarcinoma (HCC)  12/12/2022 Cancer Staging   Staging form: Exocrine Pancreas, AJCC 8th Edition - Clinical stage from 12/12/2022: Stage IIA (cT3, cN0, cM0) - Signed by Malachy Mood, MD on 12/28/2022 Total positive nodes: 0   12/16/2022 Initial Diagnosis   Pancreatic adenocarcinoma (HCC)   01/26/2023 -  Chemotherapy   Patient is on Treatment Plan : PANCREATIC Abraxane D1,8,15 + Gemcitabine D1,8,15 q28d        Discussed the use of AI scribe software for clinical note transcription with the patient, who gave verbal consent to proceed.  History of Present Illness   Kathryn Underwood, a 63 year old female with a known diagnosis  of pancreatic cancer, presents for a follow-up visit. She is currently undergoing chemotherapy and  reports experiencing hair loss and nausea. She notes that the nausea occurred once after her last treatment and was managed with medication. She has not experienced any further episodes of nausea since then. Her energy levels are described as "not bad" and she has been able to maintain her daily activities and self-care. She has been trying to increase her iron intake by eating more red meat. She has also noticed a slight weight gain since her last visit.  In addition to her cancer-related concerns, Eddye brings up a mole that has been present for several years. She reports that the top of the mole came off and bled when she touched it a few months ago. She also mentions a small lump, which she believes to be a cyst, and hard lymph nodes in her neck. She has concerns about these skin and lymph node changes and is seeking advice on how to proceed.  Merrisa also discusses her use of oxycodone for pain management. She reports taking approximately four tablets a day and sometimes goes seven hours between doses if she has a good night's sleep. She has been experiencing difficulties with her pharmacy not having enough of the medication to fill her prescription.         All other systems were reviewed with the patient and are negative.  MEDICAL HISTORY:  Past Medical History:  Diagnosis Date   Alcohol abuse    Anxiety    Chickenpox    Depression    Neuromuscular disorder (HCC)    neuropathy   Pancreatic cancer (HCC)    Substance abuse (HCC)     SURGICAL HISTORY: Past Surgical History:  Procedure Laterality Date   BIOPSY  12/10/2022   Procedure: BIOPSY;  Surgeon: Mansouraty, Netty Starring., MD;  Location: [facility];  Service: Gastroenterology;;   CESAREAN SECTION     DUODENAL STENT PLACEMENT N/A 01/06/2023   Procedure: DUODENAL STENT PLACEMENT;  Surgeon: Lemar Lofty., MD;  Location: [facility];  Service: Gastroenterology;  Laterality: N/A;   ESOPHAGOGASTRODUODENOSCOPY N/A  08/14/2021   Procedure: ESOPHAGOGASTRODUODENOSCOPY (EGD);  Surgeon: Rachael Fee, MD;  Location: [facility];  Service: Gastroenterology;  Laterality: N/A;   ESOPHAGOGASTRODUODENOSCOPY N/A 12/12/2022   Procedure: ESOPHAGOGASTRODUODENOSCOPY (EGD);  Surgeon: Lemar Lofty., MD;  Location: [facility];  Service: Gastroenterology;  Laterality: N/A;   ESOPHAGOGASTRODUODENOSCOPY (EGD) WITH PROPOFOL N/A 12/10/2022   Procedure: ESOPHAGOGASTRODUODENOSCOPY (EGD) WITH PROPOFOL;  Surgeon: Meridee Score Netty Starring., MD;  [facility];  Service: Gastroenterology;  Laterality: N/A;   ESOPHAGOGASTRODUODENOSCOPY (EGD) WITH PROPOFOL N/A 01/06/2023   Procedure: ESOPHAGOGASTRODUODENOSCOPY (EGD) WITH PROPOFOL;  Surgeon: Meridee Score Netty Starring., MD;  [facility];  Service: Gastroenterology;  Laterality: N/A;  with duodenal stent placement   EUS N/A 08/14/2021   Procedure: UPPER ENDOSCOPIC ULTRASOUND (EUS) RADIAL;  Surgeon: Rachael Fee, MD;  [facility]: [facility];  Service: Gastroenterology;  Laterality: N/A;   EUS N/A 12/12/2022   Procedure: UPPER ENDOSCOPIC ULTRASOUND (EUS) LINEAR;  Surgeon: Lemar Lofty., MD;  [facility];  Service: Gastroenterology;  Laterality: N/A;   EUS N/A 01/06/2023   Procedure: FULL UPPER ENDOSCOPIC ULTRASOUND (EUS) RADIAL;  Surgeon: Meridee Score Netty Starring., MD;  [facility];  Service: Gastroenterology;  Laterality: N/A;   FINE NEEDLE ASPIRATION N/A 08/14/2021   Procedure: FINE NEEDLE ASPIRATION (FNA) LINEAR;  Surgeon: Rachael Fee, MD;  [facility];  Service: Gastroenterology;  Laterality: N/A;   FINE  NEEDLE ASPIRATION N/A 12/12/2022   Procedure: FINE NEEDLE ASPIRATION (FNA) LINEAR;  Surgeon: Lemar Lofty., MD;  [facility];  Service: Gastroenterology;  Laterality: N/A;   FINE NEEDLE ASPIRATION  01/06/2023   Procedure: FINE NEEDLE ASPIRATION;  Surgeon: Meridee Score Netty Starring., MD;   Location: [facility];  Service: Gastroenterology;;   IR EXCHANGE BILIARY DRAIN  01/29/2023   IR IMAGING GUIDED PORT INSERTION  12/18/2022   IR PERC CHOLECYSTOSTOMY  12/13/2022   TONSILLECTOMY      I have reviewed the social history and family history with the patient and they are unchanged from previous note.  ALLERGIES:  has no known allergies.  MEDICATIONS:  Current Outpatient Medications  Medication Sig Dispense Refill   acetaminophen (TYLENOL) 500 MG tablet Take 500-1,000 mg by mouth every 6 (six) hours as needed for moderate pain (pain score 4-6).     apixaban (ELIQUIS) 5 MG TABS tablet Take 1 tablet (5 mg total) by mouth 2 (two) times daily. 60 tablet 0   Ensure Max Protein (ENSURE MAX PROTEIN) LIQD Take 330 mLs (11 oz total) by mouth 2 (two) times daily.     gabapentin (NEURONTIN) 300 MG capsule Take 2 capsules (600 mg total) by mouth 2 (two) times daily. (Patient taking differently: Take 900 mg by mouth at bedtime.) 60 capsule 0   lidocaine-prilocaine (EMLA) cream Apply to affected area once 30 g 3   mirtazapine (REMERON) 45 MG tablet Take 45 mg by mouth at bedtime.     ondansetron (ZOFRAN) 8 MG tablet Take 1 tablet (8 mg total) by mouth every 8 (eight) hours as needed for nausea or vomiting. 30 tablet 1   oxyCODONE (OXY IR/ROXICODONE) 5 MG immediate release tablet Take 1 tablet (5 mg total) by mouth every 6 (six) hours as needed for moderate pain (pain score 4-6). 100 tablet 0   pantoprazole (PROTONIX) 40 MG tablet Take 1 tablet (40 mg total) by mouth 2 (two) times daily. 180 tablet 0   prochlorperazine (COMPAZINE) 10 MG tablet Take 1 tablet (10 mg total) by mouth every 6 (six) hours as needed for nausea or vomiting. 30 tablet 1   QUEtiapine (SEROQUEL) 100 MG tablet Take 200 mg by mouth at bedtime.     sodium chloride flush (NS) 0.9 % SOLN Use 5 - 10 mLs to flush abdominal drain once daily as directed 300 mL 3   sucralfate (CARAFATE) 1 GM/10ML suspension TAKE 10 MLS (1 G TOTAL) BY  MOUTH 4 (FOUR) TIMES DAILY - WITH MEALS AND AT BEDTIME. 420 mL 1   Vitamin D, Ergocalciferol, (DRISDOL) 1.25 MG (50000 UNIT) CAPS capsule Take 1 capsule (50,000 Units total) by mouth every 7 (seven) days. 5 capsule 6   No current facility-administered medications for this visit.   Facility-Administered Medications Ordered in Other Visits  Medication Dose Route Frequency Provider Last Rate Last Admin   0.9 %  sodium chloride infusion   Intravenous Continuous Malachy Mood, MD   Stopped at 02/17/23 1523   sodium chloride flush (NS) 0.9 % injection 10 mL  10 mL Intracatheter PRN Malachy Mood, MD   10 mL at 02/17/23 1522    PHYSICAL EXAMINATION: ECOG PERFORMANCE STATUS: 2 - Symptomatic, <50% confined to bed  Vitals:   02/17/23 1200  BP: 95/60  Pulse: (!) 113  Resp: 17  Temp: (!) 97.3 F (36.3 C)  SpO2: 97%   Wt Readings from Last 3 Encounters:  02/17/23 107 lb 11.2 oz (48.9 kg)  02/10/23 107 lb  3.2 oz (48.6 kg)  01/29/23 101 lb 10.1 oz (46.1 kg)     GENERAL:alert, no distress and comfortable SKIN: skin color, texture, turgor are normal, no rashes or significant lesions EYES: normal, Conjunctiva are pink and non-injected, sclera clear NECK: supple, thyroid normal size, non-tender, without nodularity LYMPH:  no palpable lymphadenopathy in the cervical, axillary  LUNGS: clear to auscultation and percussion with normal breathing effort HEART: regular rate & rhythm and no murmurs and no lower extremity edema ABDOMEN:abdomen soft, non-tender and normal bowel sounds Musculoskeletal:no cyanosis of digits and no clubbing  NEURO: alert & oriented x 3 with fluent speech, no focal motor/sensory deficits  LABORATORY DATA:  I have reviewed the data as listed    Latest Ref Rng & Units 02/17/2023   11:37 AM 02/10/2023    9:03 AM 01/29/2023    8:55 AM  CBC  WBC 4.0 - 10.5 K/uL 8.6  7.6  7.9   Hemoglobin 12.0 - 15.0 g/dL 7.2  7.7  7.8   Hematocrit 36.0 - 46.0 % 23.2  24.2  24.4   Platelets 150 -  400 K/uL 523  756  671         Latest Ref Rng & Units 02/17/2023   11:37 AM 02/10/2023    9:03 AM 01/29/2023    8:55 AM  CMP  Glucose 70 - 99 mg/dL 161  096  045   BUN 8 - 23 mg/dL 17  18  20    Creatinine 0.44 - 1.00 mg/dL 4.09  8.11  9.14   Sodium 135 - 145 mmol/L 134  135  133   Potassium 3.5 - 5.1 mmol/L 4.3  4.2  3.9   Chloride 98 - 111 mmol/L 102  104  100   CO2 22 - 32 mmol/L 28  28  27    Calcium 8.9 - 10.3 mg/dL 8.9  8.6  8.4   Total Protein 6.5 - 8.1 g/dL 5.9  5.8  6.0   Total Bilirubin 0.0 - 1.2 mg/dL 0.7  0.4  0.9   Alkaline Phos 38 - 126 U/L 687  482  511   AST 15 - 41 U/L 35  25  22   ALT 0 - 44 U/L 43  24  32       RADIOGRAPHIC STUDIES: I have personally reviewed the radiological images as listed and agreed with the findings in the report. No results found.    Orders Placed This Encounter  Procedures   Cancer antigen 19-9    Standing Status:   Standing    Number of Occurrences:   20    Expiration Date:   02/17/2024   All questions were answered. The patient knows to call the clinic with any problems, questions or concerns. No barriers to learning was detected. The total time spent in the appointment was 40 minutes.     Malachy Mood, MD 02/17/2023

## 2023-02-18 ENCOUNTER — Inpatient Hospital Stay: Payer: Medicaid Other

## 2023-02-18 ENCOUNTER — Other Ambulatory Visit: Payer: Self-pay | Admitting: Physician Assistant

## 2023-02-18 ENCOUNTER — Encounter: Payer: Self-pay | Admitting: Hematology

## 2023-02-18 ENCOUNTER — Encounter: Payer: Self-pay | Admitting: General Practice

## 2023-02-18 ENCOUNTER — Other Ambulatory Visit: Payer: Self-pay

## 2023-02-18 ENCOUNTER — Other Ambulatory Visit (HOSPITAL_COMMUNITY): Payer: Self-pay

## 2023-02-18 VITALS — BP 103/64 | HR 99 | Temp 98.4°F | Resp 16

## 2023-02-18 DIAGNOSIS — E860 Dehydration: Secondary | ICD-10-CM

## 2023-02-18 DIAGNOSIS — C259 Malignant neoplasm of pancreas, unspecified: Secondary | ICD-10-CM

## 2023-02-18 DIAGNOSIS — Z5111 Encounter for antineoplastic chemotherapy: Secondary | ICD-10-CM | POA: Diagnosis not present

## 2023-02-18 DIAGNOSIS — D649 Anemia, unspecified: Secondary | ICD-10-CM

## 2023-02-18 LAB — CANCER ANTIGEN 19-9: CA 19-9: 487 U/mL — ABNORMAL HIGH (ref 0–35)

## 2023-02-18 MED ORDER — IRON SUCROSE 20 MG/ML IV SOLN
200.0000 mg | Freq: Once | INTRAVENOUS | Status: AC
  Administered 2023-02-18: 200 mg via INTRAVENOUS
  Filled 2023-02-18: qty 10

## 2023-02-18 MED ORDER — ACETAMINOPHEN 325 MG PO TABS
650.0000 mg | ORAL_TABLET | Freq: Once | ORAL | Status: AC
  Administered 2023-02-18: 650 mg via ORAL
  Filled 2023-02-18: qty 2

## 2023-02-18 MED ORDER — SODIUM CHLORIDE 0.9% IV SOLUTION
250.0000 mL | INTRAVENOUS | Status: DC
  Administered 2023-02-18: 100 mL via INTRAVENOUS

## 2023-02-18 MED ORDER — DIPHENHYDRAMINE HCL 25 MG PO CAPS
25.0000 mg | ORAL_CAPSULE | Freq: Once | ORAL | Status: AC
  Administered 2023-02-18: 25 mg via ORAL
  Filled 2023-02-18: qty 1

## 2023-02-18 MED ORDER — SODIUM CHLORIDE 0.9 % IV SOLN: INTRAVENOUS | Status: DC

## 2023-02-18 NOTE — Progress Notes (Signed)
[facility] Spiritual Care Note  Met Ms Muneton today in infusion, bringing care package of handmade lap blanket, handmade hat, and journal, per referral from nursing for additional layer of emotional support.  Ms Kamei was welcoming of visit, using opportunity to process implications of the news that her cancer appears inoperable, as well as her questions about what/how/when to tell her grown children.   Provided compassionate presence, reflective listening, emotional support, normalization of feelings, and introduction to Spiritual Care. We plan to follow up by phone next week for a pastoral check-in and opportunity to speak in a more private environment. Ms Polanco also has direct Spiritual Care number in case needs arise before then.   [address] Rush [place], [place], [facility] Pager [phone] Voicemail [phone]

## 2023-02-18 NOTE — Patient Instructions (Addendum)
Blood Transfusion, Adult, Care After After a blood transfusion, it is common to have: Bruising and soreness at the IV site. A headache. Follow these instructions at home: Your doctor may give you more instructions. If you have problems, contact your doctor. Insertion site care     Follow instructions from your doctor about how to take care of your insertion site. This is where an IV tube was put into your vein. Make sure you: Wash your hands with soap and water for at least 20 seconds before and after you change your bandage. If you cannot use soap and water, use hand sanitizer. Change your bandage as told by your doctor. Check your insertion site every day for signs of infection. Check for: Redness, swelling, or pain. Bleeding from the site. Warmth. Pus or a bad smell. General instructions Take over-the-counter and prescription medicines only as told by your doctor. Rest as told by your doctor. Go back to your normal activities as told by your doctor. Keep all follow-up visits. You may need to have tests at certain times to check your blood. Contact a doctor if: You have itching or red, swollen areas of skin (hives). You have a fever or chills. You have pain in the head, back, or chest. You feel worried or nervous (anxious). You feel weak after doing your normal activities. You have any of these problems at the insertion site: Redness, swelling, warmth, or pain. Bleeding that does not stop with pressure. Pus or a bad smell. If you received your blood transfusion in an outpatient setting, you will be told whom to contact to report any reactions. Get help right away if: You have signs of a serious reaction. This may be coming from an allergy or the body's defense system (immune system). Signs include: Trouble breathing or shortness of breath. Swelling of the face or feeling warm (flushed). A widespread rash. Dark pee (urine) or blood in the pee. Fast heartbeat. These symptoms  may be an emergency. Get help right away. Call 911. Do not wait to see if the symptoms will go away. Do not drive yourself to the hospital. Summary Bruising and soreness at the IV site are common. Check your insertion site every day for signs of infection. Rest as told by your doctor. Go back to your normal activities as told by your doctor. Get help right away if you have signs of a serious reaction. This information is not intended to replace advice given to you by your health care provider. Make sure you discuss any questions you have with your health care provider. Iron Sucrose Injection What is this medication? IRON SUCROSE (EYE ern SOO krose) treats low levels of iron (iron deficiency anemia) in people with kidney disease. Iron is a mineral that plays an important role in making red blood cells, which carry oxygen from your lungs to the rest of your body. This medicine may be used for other purposes; ask your health care provider or pharmacist if you have questions. COMMON BRAND NAME(S): Venofer What should I tell my care team before I take this medication? They need to know if you have any of these conditions: Anemia not caused by low iron levels Heart disease High levels of iron in the blood Kidney disease Liver disease An unusual or allergic reaction to iron, other medications, foods, dyes, or preservatives Pregnant or trying to get pregnant Breastfeeding How should I use this medication? This medication is infused into a vein. It is given by your care team  in a hospital or clinic setting. Talk to your care team about the use of this medication in children. While it may be prescribed for children as young as 2 years for selected conditions, precautions do apply. Overdosage: If you think you have taken too much of this medicine contact a poison control center or emergency room at once. NOTE: This medicine is only for you. Do not share this medicine with others. What if I miss a  dose? Keep appointments for follow-up doses. It is important not to miss your dose. Call your care team if you are unable to keep an appointment. What may interact with this medication? Do not take this medication with any of the following: Deferoxamine Dimercaprol Other iron products This medication may also interact with the following: Chloramphenicol Deferasirox This list may not describe all possible interactions. Give your health care provider a list of all the medicines, herbs, non-prescription drugs, or dietary supplements you use. Also tell them if you smoke, drink alcohol, or use illegal drugs. Some items may interact with your medicine. What should I watch for while using this medication? Visit your care team for regular checks on your progress. Tell your care team if your symptoms do not start to get better or if they get worse. You may need blood work done while you are taking this medication. You may need to eat more foods that contain iron. Talk to your care team. Foods that contain iron include whole grains or cereals, dried fruits, beans, peas, leafy green vegetables, and organ meats (liver, kidney). What side effects may I notice from receiving this medication? Side effects that you should report to your care team as soon as possible: Allergic reactions--skin rash, itching, hives, swelling of the face, lips, tongue, or throat Low blood pressure--dizziness, feeling faint or lightheaded, blurry vision Shortness of breath Side effects that usually do not require medical attention (report to your care team if they continue or are bothersome): Flushing Headache Joint pain Muscle pain Nausea Pain, redness, or irritation at injection site This list may not describe all possible side effects. Call your doctor for medical advice about side effects. You may report side effects to FDA at 1-800-FDA-1088. Where should I keep my medication? This medication is given in a hospital or  clinic. It will not be stored at home. NOTE: This sheet is a summary. It may not cover all possible information. If you have questions about this medicine, talk to your doctor, pharmacist, or health care provider.  2024 Elsevier/Gold Standard (2022-09-02 00:00:00)

## 2023-02-19 LAB — TYPE AND SCREEN
ABO/RH(D): O POS
Antibody Screen: NEGATIVE
Unit division: 0
Unit division: 0

## 2023-02-19 LAB — BPAM RBC
Blood Product Expiration Date: 202502032359
Blood Product Expiration Date: 202502122359
ISSUE DATE / TIME: 202501230804
ISSUE DATE / TIME: 202501230804
Unit Type and Rh: 5100
Unit Type and Rh: 5100

## 2023-02-22 ENCOUNTER — Telehealth: Payer: Self-pay

## 2023-02-22 NOTE — Telephone Encounter (Signed)
Pt LVM stating that she would like a 2nd opinion regarding resection.  Pt stated Dr. Mosetta Putt and Dr. Freida Busman stated the pt was not a candidate for resection.  Returned pt's call.  Unfortunately, not able to speak with pt but LVM explaining our the 2nd Opinion Referral process works.  Requested for pt to give Dr. Latanya Maudlin office a call on 02/23/2023 indicating which hospital ([facility], [facility], or [facility]) the pt would like the 2nd Opinion for resection.  Awaiting pt's return call.

## 2023-02-23 ENCOUNTER — Telehealth: Payer: Self-pay | Admitting: Genetic Counselor

## 2023-02-23 NOTE — Telephone Encounter (Signed)
Instant message asked if patient could have blood drawn on 2/12 rather than 2/14 at appointment.  Agreed for this and put in orders.  Patient will see Tresa Endo on 2/14 and she will order testing.

## 2023-02-24 ENCOUNTER — Encounter: Payer: Self-pay | Admitting: General Practice

## 2023-02-24 ENCOUNTER — Other Ambulatory Visit: Payer: Self-pay

## 2023-02-24 NOTE — Progress Notes (Signed)
[facility] Spiritual Care Note  Attempted follow-up call, leaving voicemail with direct number and encouragement to return call.   [address] [place], [place], [facility] Pager [phone] Voicemail [phone]

## 2023-02-26 ENCOUNTER — Other Ambulatory Visit: Payer: Self-pay

## 2023-03-01 ENCOUNTER — Other Ambulatory Visit: Payer: Medicaid Other

## 2023-03-01 ENCOUNTER — Ambulatory Visit: Payer: Medicaid Other

## 2023-03-01 ENCOUNTER — Other Ambulatory Visit: Payer: Self-pay | Admitting: Physician Assistant

## 2023-03-01 ENCOUNTER — Other Ambulatory Visit: Payer: Self-pay

## 2023-03-01 ENCOUNTER — Telehealth: Payer: Self-pay

## 2023-03-01 ENCOUNTER — Ambulatory Visit: Payer: Medicaid Other | Admitting: Nurse Practitioner

## 2023-03-01 ENCOUNTER — Other Ambulatory Visit: Payer: Self-pay | Admitting: Nurse Practitioner

## 2023-03-01 ENCOUNTER — Encounter: Payer: Self-pay | Admitting: Hematology

## 2023-03-01 ENCOUNTER — Other Ambulatory Visit (HOSPITAL_COMMUNITY): Payer: Self-pay

## 2023-03-01 MED ORDER — APIXABAN 5 MG PO TABS: 5.0000 mg | ORAL_TABLET | Freq: Two times a day (BID) | ORAL | 1 refills | Status: DC

## 2023-03-01 NOTE — Telephone Encounter (Signed)
Pt called stating she wants a refill on the Apixaban (Eliquis).  Pt stated she's been out of the Apixaban for 5 days now.  Pt stated the Apixaban was prescribed while the pt was w/in the hospital and the pt does not know why Apixaban was prescribed.  Pt called requesting a refill.  Stated this nurse would make Dr. Mosetta Putt aware of the pt's request.  Pt verbalized understanding.

## 2023-03-03 ENCOUNTER — Inpatient Hospital Stay: Payer: Medicaid Other | Attending: Hematology

## 2023-03-03 ENCOUNTER — Inpatient Hospital Stay (HOSPITAL_BASED_OUTPATIENT_CLINIC_OR_DEPARTMENT_OTHER): Payer: Medicaid Other | Admitting: Nurse Practitioner

## 2023-03-03 ENCOUNTER — Ambulatory Visit (HOSPITAL_COMMUNITY)
Admission: RE | Admit: 2023-03-03 | Discharge: 2023-03-03 | Disposition: A | Discharge status: Home / Self Care | Payer: Medicaid Other | Source: Ambulatory Visit | Attending: Nurse Practitioner | Admitting: Nurse Practitioner

## 2023-03-03 ENCOUNTER — Inpatient Hospital Stay: Payer: Medicaid Other

## 2023-03-03 ENCOUNTER — Encounter: Payer: Self-pay | Admitting: Nurse Practitioner

## 2023-03-03 VITALS — BP 102/58 | HR 107 | Temp 97.3°F | Resp 16 | Wt 103.6 lb

## 2023-03-03 VITALS — BP 102/61 | HR 94 | Temp 98.5°F | Resp 18

## 2023-03-03 DIAGNOSIS — Z5111 Encounter for antineoplastic chemotherapy: Secondary | ICD-10-CM | POA: Diagnosis present

## 2023-03-03 DIAGNOSIS — C259 Malignant neoplasm of pancreas, unspecified: Secondary | ICD-10-CM | POA: Insufficient documentation

## 2023-03-03 DIAGNOSIS — D509 Iron deficiency anemia, unspecified: Secondary | ICD-10-CM | POA: Diagnosis present

## 2023-03-03 DIAGNOSIS — R059 Cough, unspecified: Secondary | ICD-10-CM | POA: Insufficient documentation

## 2023-03-03 DIAGNOSIS — C258 Malignant neoplasm of overlapping sites of pancreas: Secondary | ICD-10-CM | POA: Diagnosis not present

## 2023-03-03 DIAGNOSIS — E860 Dehydration: Secondary | ICD-10-CM | POA: Insufficient documentation

## 2023-03-03 LAB — CMP (CANCER CENTER ONLY)
ALT: 14 U/L (ref 0–44)
AST: 23 U/L (ref 15–41)
Albumin: 3.1 g/dL — ABNORMAL LOW (ref 3.5–5.0)
Alkaline Phosphatase: 294 U/L — ABNORMAL HIGH (ref 38–126)
Anion gap: 6 (ref 5–15)
BUN: 9 mg/dL (ref 8–23)
CO2: 28 mmol/L (ref 22–32)
Calcium: 8.5 mg/dL — ABNORMAL LOW (ref 8.9–10.3)
Chloride: 101 mmol/L (ref 98–111)
Creatinine: 0.37 mg/dL — ABNORMAL LOW (ref 0.44–1.00)
GFR, Estimated: >60 mL/min (ref >60)
Glucose, Bld: 102 mg/dL — ABNORMAL HIGH (ref 70–99)
Potassium: 3.8 mmol/L (ref 3.5–5.1)
Sodium: 135 mmol/L (ref 135–145)
Total Bilirubin: 0.4 mg/dL (ref 0.0–1.2)
Total Protein: 5.7 g/dL — ABNORMAL LOW (ref 6.5–8.1)

## 2023-03-03 LAB — CBC WITH DIFFERENTIAL (CANCER CENTER ONLY)
Abs Immature Granulocytes: 0.73 10*3/uL — ABNORMAL HIGH (ref 0.00–0.07)
Basophils Absolute: 0.1 10*3/uL (ref 0.0–0.1)
Basophils Relative: 1 %
Eosinophils Absolute: 0 10*3/uL (ref 0.0–0.5)
Eosinophils Relative: 0 %
HCT: 33.1 % — ABNORMAL LOW (ref 36.0–46.0)
Hemoglobin: 10.3 g/dL — ABNORMAL LOW (ref 12.0–15.0)
Immature Granulocytes: 7 %
Lymphocytes Relative: 22 %
Lymphs Abs: 2.4 10*3/uL (ref 0.7–4.0)
MCH: 27.7 pg (ref 26.0–34.0)
MCHC: 31.1 g/dL (ref 30.0–36.0)
MCV: 89 fL (ref 80.0–100.0)
Monocytes Absolute: 2.1 10*3/uL — ABNORMAL HIGH (ref 0.1–1.0)
Monocytes Relative: 20 %
Neutro Abs: 5.4 10*3/uL (ref 1.7–7.7)
Neutrophils Relative %: 50 %
Platelet Count: 761 10*3/uL — ABNORMAL HIGH (ref 150–400)
RBC: 3.72 MIL/uL — ABNORMAL LOW (ref 3.87–5.11)
RDW: 17.1 % — ABNORMAL HIGH (ref 11.5–15.5)
Smear Review: NORMAL
WBC Count: 10.7 10*3/uL — ABNORMAL HIGH (ref 4.0–10.5)
nRBC: 0.2 % (ref 0.0–0.2)

## 2023-03-03 MED ORDER — SODIUM CHLORIDE 0.9 % IV SOLN
1000.0000 mg/m2 | Freq: Once | INTRAVENOUS | Status: AC
  Administered 2023-03-03: 1406 mg via INTRAVENOUS
  Filled 2023-03-03: qty 36.98

## 2023-03-03 MED ORDER — IRON SUCROSE 20 MG/ML IV SOLN
200.0000 mg | Freq: Once | INTRAVENOUS | Status: AC
  Administered 2023-03-03: 200 mg via INTRAVENOUS
  Filled 2023-03-03: qty 10

## 2023-03-03 MED ORDER — SODIUM CHLORIDE 0.9% FLUSH
10.0000 mL | INTRAVENOUS | Status: DC | PRN
  Administered 2023-03-03: 10 mL

## 2023-03-03 MED ORDER — PACLITAXEL PROTEIN-BOUND CHEMO INJECTION 100 MG
125.0000 mg/m2 | Freq: Once | INTRAVENOUS | Status: AC
  Administered 2023-03-03: 175 mg via INTRAVENOUS
  Filled 2023-03-03: qty 35

## 2023-03-03 MED ORDER — PROCHLORPERAZINE MALEATE 10 MG PO TABS
10.0000 mg | ORAL_TABLET | Freq: Once | ORAL | Status: AC
  Administered 2023-03-03: 10 mg via ORAL
  Filled 2023-03-03: qty 1

## 2023-03-03 MED ORDER — SODIUM CHLORIDE 0.9 % IV SOLN
Freq: Once | INTRAVENOUS | Status: AC
Start: 2023-03-03 — End: 2023-03-03

## 2023-03-03 MED ORDER — HEPARIN SOD (PORK) LOCK FLUSH 100 UNIT/ML IV SOLN
500.0000 [IU] | Freq: Once | INTRAVENOUS | Status: AC | PRN
  Administered 2023-03-03: 500 [IU]

## 2023-03-03 MED ORDER — SODIUM CHLORIDE 0.9 % IV SOLN
INTRAVENOUS | Status: DC
Start: 2023-03-03 — End: 2023-03-03

## 2023-03-03 MED ORDER — SODIUM CHLORIDE 0.9% FLUSH
10.0000 mL | Freq: Once | INTRAVENOUS | Status: AC
  Administered 2023-03-03: 10 mL

## 2023-03-03 NOTE — Progress Notes (Signed)
 Patient Care Team: Allwardt, Mardy HERO, PA-C as PCP - General (Physician Assistant) Maurice Loving, MD as Referring Physician (Psychiatry) Lanny Callander, MD as Consulting Physician (Hematology and Oncology)   CHIEF COMPLAINT: Follow up pancreatic cancer   Oncology History  Pancreatic adenocarcinoma [facility])  12/12/2022 Cancer Staging   Staging form: Exocrine Pancreas, AJCC 8th Edition - Clinical stage from 12/12/2022: Stage IIA (cT3, cN0, cM0) - Signed by Lanny Callander, MD on 12/28/2022 Total positive nodes: 0   12/16/2022 Initial Diagnosis   Pancreatic adenocarcinoma (HCC)   01/26/2023 -  Chemotherapy   Patient is on Treatment Plan : PANCREATIC Abraxane  D1,8 + Gemcitabine  D1,8 q21d        CURRENT THERAPY: First-line chemotherapy gemcitabine  and Abraxane  on day 1 and 8 every 21 days   INTERVAL HISTORY Kathryn Underwood returns for follow up and treatment. Last seen by Dr. Lanny 02/17/23 with C2 D8 gem/abraxane .  Had mild fatigue and nausea but feels that she is doing well overall.  Antiemetics were helpful.  She is eating and drinking.  Still putting out biliary drainage but not measuring the amount, empties about 5 times a day, normal for her lately.  Legs are slightly puffy, no calf pain.  She has an intermittent cough with clear phlegm that is not new.  Denies chest pain or dyspnea. Attributes to weather fluctuation and smoking a few cigarettes daily. She ran out of Eliquis  for a week or so but was finally refilled.  Back pain is stable on treatment, takes Oxy every 4-6 hours but can go longer at night.  Bowels moving well.  She has more noticeable nonpainful tingling in her fingertips and toes.  Like a second opinion on resectability.  She feels like she had a good week off and is ready for treatment today.   ROS  All other systems reviewed and negative  Past Medical History:  Diagnosis Date   Alcohol abuse    Anxiety    Chickenpox    Depression    Neuromuscular disorder (HCC)    neuropathy    Pancreatic cancer (HCC)    Substance abuse (HCC)      Past Surgical History:  Procedure Laterality Date   BIOPSY  12/10/2022   Procedure: BIOPSY;  Surgeon: Mansouraty, Aloha Raddle., MD;  Location: [facility];  Service: Gastroenterology;;   CESAREAN SECTION     DUODENAL STENT PLACEMENT N/A 01/06/2023   Procedure: DUODENAL STENT PLACEMENT;  Surgeon: Wilhelmenia Aloha Raddle., MD;  Location: [facility];  Service: Gastroenterology;  Laterality: N/A;   ESOPHAGOGASTRODUODENOSCOPY N/A 08/14/2021   Procedure: ESOPHAGOGASTRODUODENOSCOPY (EGD);  Surgeon: Teressa Toribio SQUIBB, MD;  Location: [facility];  Service: Gastroenterology;  Laterality: N/A;   ESOPHAGOGASTRODUODENOSCOPY N/A 12/12/2022   Procedure: ESOPHAGOGASTRODUODENOSCOPY (EGD);  Surgeon: Wilhelmenia Aloha Raddle., MD;  Location: [facility];  Service: Gastroenterology;  Laterality: N/A;   ESOPHAGOGASTRODUODENOSCOPY (EGD) WITH PROPOFOL  N/A 12/10/2022   Procedure: ESOPHAGOGASTRODUODENOSCOPY (EGD) WITH PROPOFOL ;  Surgeon: Wilhelmenia Aloha Raddle., MD;  [facility];  Service: Gastroenterology;  Laterality: N/A;   ESOPHAGOGASTRODUODENOSCOPY (EGD) WITH PROPOFOL  N/A 01/06/2023   Procedure: ESOPHAGOGASTRODUODENOSCOPY (EGD) WITH PROPOFOL ;  Surgeon: Wilhelmenia Aloha Raddle., MD;  Location: [facility];  Service: Gastroenterology;  Laterality: N/A;  with duodenal stent placement   EUS N/A 08/14/2021   Procedure: UPPER ENDOSCOPIC ULTRASOUND (EUS) RADIAL;  Surgeon: Teressa Toribio SQUIBB, MD;  Location: [facility];  Service: Gastroenterology;  Laterality: N/A;   EUS N/A 12/12/2022   Procedure: UPPER ENDOSCOPIC ULTRASOUND (EUS) LINEAR;  Surgeon: Wilhelmenia Aloha Raddle.,  MD;  [facility];  Service: Gastroenterology;  Laterality: N/A;   EUS N/A 01/06/2023   Procedure: FULL UPPER ENDOSCOPIC ULTRASOUND (EUS) RADIAL;  Surgeon: Wilhelmenia Aloha Raddle., MD;  [facility];  Service: Gastroenterology;  Laterality: N/A;   FINE NEEDLE  ASPIRATION N/A 08/14/2021   Procedure: FINE NEEDLE ASPIRATION (FNA) LINEAR;  Surgeon: Teressa Toribio SQUIBB, MD;  [facility];  Service: Gastroenterology;  Laterality: N/A;   FINE NEEDLE ASPIRATION N/A 12/12/2022   Procedure: FINE NEEDLE ASPIRATION (FNA) LINEAR;  Surgeon: Wilhelmenia Aloha Raddle., MD;  [facility];  Service: Gastroenterology;  Laterality: N/A;   FINE NEEDLE ASPIRATION  01/06/2023   Procedure: FINE NEEDLE ASPIRATION;  Surgeon: Wilhelmenia Aloha Raddle., MD;  [facility];  Service: Gastroenterology;;   IR EXCHANGE BILIARY DRAIN  01/29/2023   IR IMAGING GUIDED PORT INSERTION  12/18/2022   IR PERC CHOLECYSTOSTOMY  12/13/2022   TONSILLECTOMY       Outpatient Encounter Medications as of 03/03/2023  Medication Sig   acetaminophen  (TYLENOL ) 500 MG tablet Take 500-1,000 mg by mouth every 6 (six) hours as needed for moderate pain (pain score 4-6).   apixaban  (ELIQUIS ) 5 MG TABS tablet Take 1 tablet (5 mg total) by mouth 2 (two) times daily.   Ensure Max Protein (ENSURE MAX PROTEIN) LIQD Take 330 mLs (11 oz total) by mouth 2 (two) times daily.   gabapentin  (NEURONTIN ) 300 MG capsule Take 2 capsules (600 mg total) by mouth 2 (two) times daily. (Patient taking differently: Take 900 mg by mouth at bedtime.)   lidocaine -prilocaine  (EMLA ) cream Apply to affected area once   mirtazapine  (REMERON ) 45 MG tablet Take 45 mg by mouth at bedtime.   ondansetron  (ZOFRAN ) 8 MG tablet Take 1 tablet (8 mg total) by mouth every 8 (eight) hours as needed for nausea or vomiting.   oxyCODONE  (OXY IR/ROXICODONE ) 5 MG immediate release tablet Take 1 tablet (5 mg total) by mouth every 6 (six) hours as needed for moderate pain (pain score 4-6).   pantoprazole  (PROTONIX ) 40 MG tablet Take 1 tablet (40 mg total) by mouth 2 (two) times daily.   prochlorperazine  (COMPAZINE ) 10 MG tablet Take 1 tablet (10 mg total) by mouth every 6 (six) hours as needed for nausea or vomiting.   QUEtiapine   (SEROQUEL ) 100 MG tablet Take 200 mg by mouth at bedtime.   sodium chloride  flush (NS) 0.9 % SOLN Use 5 - 10 mLs to flush abdominal drain once daily as directed   sucralfate  (CARAFATE ) 1 GM/10ML suspension TAKE 10 MLS (1 G TOTAL) BY MOUTH 4 (FOUR) TIMES DAILY - WITH MEALS AND AT BEDTIME.   Vitamin D , Ergocalciferol , (DRISDOL ) 1.25 MG (50000 UNIT) CAPS capsule Take 1 capsule (50,000 Units total) by mouth every 7 (seven) days.   No facility-administered encounter medications on file as of 03/03/2023.     Today's Vitals   03/03/23 0850 03/03/23 0854  BP: (!) 102/58   Pulse: (!) 107   Resp: 16   Temp: (!) 97.3 F (36.3 C)   TempSrc: Temporal   SpO2: 95%   Weight: 103 lb 9.6 oz (47 kg)   PainSc:  0-No pain   Body mass index is 18.35 kg/m.   PHYSICAL EXAM GENERAL:alert, no distress and comfortable SKIN: no rash  EYES: sclera clear NECK: without mass LYMPH:  no palpable cervical or supraclavicular lymphadenopathy  LUNGS: clear with normal breathing effort. Mild expiratory wheeze on the right HEART: regular rate & rhythm, trace bilateral lower leg edema  ABDOMEN: abdomen soft, rounded non-tender and normal bowel sounds. Biliary drain with dressing c/d/di NEURO: alert & oriented x 3 with fluent speech, no focal motor/sensory deficits PAC without erythema    CBC    Component Value Date/Time   WBC 10.7 (H) 03/03/2023 0832   WBC 7.9 01/29/2023 0855   RBC 3.72 (L) 03/03/2023 0832   HGB 10.3 (L) 03/03/2023 0832   HCT 33.1 (L) 03/03/2023 0832   PLT 761 (H) 03/03/2023 0832   MCV 89.0 03/03/2023 0832   MCH 27.7 03/03/2023 0832   MCHC 31.1 03/03/2023 0832   RDW 17.1 (H) 03/03/2023 0832   LYMPHSABS 2.4 03/03/2023 0832   MONOABS 2.1 (H) 03/03/2023 0832   EOSABS 0.0 03/03/2023 0832   BASOSABS 0.1 03/03/2023 0832     CMP     Component Value Date/Time   NA 135 03/03/2023 0832   K 3.8 03/03/2023 0832   CL 101 03/03/2023 0832   CO2 28 03/03/2023 0832   GLUCOSE 102 (H) 03/03/2023 0832    BUN 9 03/03/2023 0832   CREATININE 0.37 (L) 03/03/2023 0832   CALCIUM 8.5 (L) 03/03/2023 0832   PROT 5.7 (L) 03/03/2023 0832   ALBUMIN 3.1 (L) 03/03/2023 0832   AST 23 03/03/2023 0832   ALT 14 03/03/2023 0832   ALKPHOS 294 (H) 03/03/2023 0832   BILITOT 0.4 03/03/2023 0832   GFRNONAA >60 03/03/2023 0832   GFRAA >60 09/20/2018 1217     ASSESSMENT & PLAN: 63 yo female   Pancreatic adenocarcinoma, 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  S/p duodenal stent placement on 01/06/2023 -she started chemo gemcitabine  and abraxane  on 01/26/2023 -Kathryn Underwood, appears stable. S/p 2 cycles gem/abraxane , tolerating well overall with mild fatigue and mild/early neuropathy. I recommend to start B complex vitamin. SEs are well managed with supportive care at home, she is able to recover and function well with adequate PS -She has mild cough which appears chronic. Encouraged smoking cessation. Will get CXR to r/o pleural effusion -Exam shows mild pitting edema in the lower legs, I encouraged her to increase dietary protein and use compression stockings and elevate legs when resting.  No strong suspicion for DVT, although she has been off Eliquis  a few weeks, will monitor closely -Labs reviewed, adequate to proceed with cycle 3-day 1 gem/Abraxane  today, no dose adjustments -He appears mildly dehydrated given the high biliary drainage output, will give some fluids today with treatment -Follow-up and cycle 3-day 8 next  week -Restage after this cycle     PLAN: -Labs reviewed -Proceed with C3 D1 gem/abraxane  at same dose, IV venofer , and 500 cc NS -CXR for cough, r/o pleural effusion  -B complex for early CIPN -Compression stockings and dietary protein for edema -Referral to [facility] for second surgical opinion per pt request -F/up and C3 D8 next week -Restage after this cycle, CT CAP order placed today     Orders Placed This Encounter  Procedures   DG Chest 2 View    Standing Status:   Future    Number of Occurrences:   1  Expected Date:   03/03/2023    Expiration Date:   03/02/2024    Reason for Exam (SYMPTOM  OR DIAGNOSIS REQUIRED):   cough, pancreatic cancer    Preferred imaging location?:   [facility]   CT CHEST ABDOMEN PELVIS W CONTRAST    Standing Status:   Future    Expected Date:   03/17/2023    Expiration Date:   03/02/2024    If indicated for the ordered procedure, I authorize the administration of contrast media per Radiology protocol:   Yes    Does the patient have a contrast media/X-ray dye allergy?:   No    Preferred imaging location?:   [facility]    If indicated for the ordered procedure, I authorize the administration of oral contrast media per Radiology protocol:   Yes   Ambulatory referral to Surgical Oncology    Referral Priority:   Routine    Referral Type:   Surgical    Referral Reason:   Second Opinion    Requested Specialty:   Surgical Oncology    Number of Visits Requested:   1      All questions were answered. The patient knows to call the clinic with any problems, questions or concerns. No barriers to learning were detected. I spent 20 minutes counseling the patient face to face. The total time spent in the appointment was 30 minutes and more than 50% was on counseling, review of test results, and coordination of care.   Ellean Firman, NP-C 03/03/2023

## 2023-03-03 NOTE — Progress Notes (Signed)
 HR 107 OK to treat w/ Abraxane  and gemzar  and iron  today with elevated HR per Lacie NP

## 2023-03-03 NOTE — Patient Instructions (Addendum)
 [facility] - A DEPT OF [facility]  Discharge Instructions: Thank you for choosing [facility] to provide your oncology and hematology care.   If you have a lab appointment with the Cancer Center, please go directly to the Cancer Center and check in at the registration area.   Wear comfortable clothing and clothing appropriate for easy access to any Portacath or PICC line.   We strive to give you quality time with your provider. You may need to reschedule your appointment if you arrive late (15 or more minutes).  Arriving late affects you and other patients whose appointments are after yours.  Also, if you miss three or more appointments without notifying the office, you may be dismissed from the clinic at the provider's discretion.      For prescription refill requests, have your pharmacy contact our office and allow 72 hours for refills to be completed.    Today you received the following chemotherapy and/or immunotherapy agents Abraxane  and Gemzar        To help prevent nausea and vomiting after your treatment, we encourage you to take your nausea medication as directed.  BELOW ARE SYMPTOMS THAT SHOULD BE REPORTED IMMEDIATELY: *FEVER GREATER THAN 100.4 F (38 C) OR HIGHER *CHILLS OR SWEATING *NAUSEA AND VOMITING THAT IS NOT CONTROLLED WITH YOUR NAUSEA MEDICATION *UNUSUAL SHORTNESS OF BREATH *UNUSUAL BRUISING OR BLEEDING *URINARY PROBLEMS (pain or burning when urinating, or frequent urination) *BOWEL PROBLEMS (unusual diarrhea, constipation, pain near the anus) TENDERNESS IN MOUTH AND THROAT WITH OR WITHOUT PRESENCE OF ULCERS (sore throat, sores in mouth, or a toothache) UNUSUAL RASH, SWELLING OR PAIN  UNUSUAL VAGINAL DISCHARGE OR ITCHING   Items with * indicate a potential emergency and should be followed up as soon as possible or go to the Emergency Department if any problems should occur.  Please show the CHEMOTHERAPY ALERT CARD or  IMMUNOTHERAPY ALERT CARD at check-in to the Emergency Department and triage nurse.  Should you have questions after your visit or need to cancel or reschedule your appointment, please contact [facility] - A DEPT OF [facility]  Dept: [phone]  and follow the prompts.  Office hours are 8:00 a.m. to 4:30 p.m. Monday - Friday. Please note that voicemails left after 4:00 p.m. may not be returned until the following business day.  We are closed weekends and major holidays. You have access to a nurse at all times for urgent questions. Please call the main number to the clinic Dept: [phone] and follow the prompts.   For any non-urgent questions, you may also contact your provider using MyChart. We now offer e-Visits for anyone 48 and older to request care online for non-urgent symptoms. For details visit [email or web address].   Also download the MyChart app! Go to the app store, search "MyChart", open the app, select [facility], and log in with your MyChart username and password.

## 2023-03-04 ENCOUNTER — Telehealth: Payer: Self-pay | Admitting: *Deleted

## 2023-03-04 ENCOUNTER — Other Ambulatory Visit: Payer: Self-pay

## 2023-03-04 NOTE — Telephone Encounter (Signed)
 Faxed referral to [facility] for Second Opinion to [phone] and confirmation of fax received

## 2023-03-05 ENCOUNTER — Other Ambulatory Visit (HOSPITAL_COMMUNITY): Payer: Self-pay | Admitting: Radiology

## 2023-03-05 ENCOUNTER — Ambulatory Visit (HOSPITAL_COMMUNITY)
Admission: RE | Admit: 2023-03-05 | Discharge: 2023-03-05 | Disposition: A | Discharge status: Home / Self Care | Payer: Medicaid Other | Source: Ambulatory Visit | Attending: Radiology | Admitting: Radiology

## 2023-03-05 ENCOUNTER — Other Ambulatory Visit: Payer: Self-pay

## 2023-03-05 DIAGNOSIS — K831 Obstruction of bile duct: Secondary | ICD-10-CM | POA: Diagnosis not present

## 2023-03-05 DIAGNOSIS — Z434 Encounter for attention to other artificial openings of digestive tract: Secondary | ICD-10-CM | POA: Diagnosis present

## 2023-03-05 HISTORY — PX: IR EXCHANGE BILIARY DRAIN: IMG6046

## 2023-03-05 LAB — CANCER ANTIGEN 19-9: CA 19-9: 465 U/mL — ABNORMAL HIGH (ref 0–35)

## 2023-03-05 MED ORDER — IOHEXOL 300 MG/ML  SOLN: 50.0000 mL | Freq: Once | INTRAMUSCULAR | Status: DC | PRN

## 2023-03-05 MED ORDER — LIDOCAINE HCL 1 % IJ SOLN
INTRAMUSCULAR | Status: AC
  Filled 2023-03-05: qty 20

## 2023-03-05 MED ORDER — LIDOCAINE HCL 1 % IJ SOLN: 20.0000 mL | Freq: Once | INTRAMUSCULAR | Status: DC

## 2023-03-05 NOTE — Procedures (Signed)
  Procedure:  Cholangiogram and cholecystostomy tube exchange no flow into CBD/bowel; 89F Preprocedure diagnosis: The encounter diagnosis was Biliary obstruction. Postprocedure diagnosis: same EBL:    minimal Complications:   none immediate  See full dictation in [employer].  CHARM Toribio Faes MD Main # [phone] Pager  [phone] Mobile [phone]

## 2023-03-06 ENCOUNTER — Other Ambulatory Visit: Payer: Self-pay

## 2023-03-08 ENCOUNTER — Other Ambulatory Visit (HOSPITAL_BASED_OUTPATIENT_CLINIC_OR_DEPARTMENT_OTHER): Payer: Self-pay

## 2023-03-08 ENCOUNTER — Other Ambulatory Visit (HOSPITAL_COMMUNITY): Payer: Self-pay

## 2023-03-08 ENCOUNTER — Other Ambulatory Visit: Payer: Self-pay | Admitting: Hematology

## 2023-03-08 MED ORDER — OXYCODONE HCL 5 MG PO TABS
5.0000 mg | ORAL_TABLET | Freq: Four times a day (QID) | ORAL | 0 refills | Status: DC | PRN
  Filled 2023-03-08: qty 20, 5d supply, fill #0

## 2023-03-08 NOTE — Telephone Encounter (Signed)
 I'm going to fill until her appointmetn on 2/13. If she is taking it that often, I'm going to refer her to see Landa Pine, NP

## 2023-03-09 ENCOUNTER — Other Ambulatory Visit (HOSPITAL_COMMUNITY): Payer: Self-pay

## 2023-03-09 NOTE — Progress Notes (Signed)
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  S/p duodenal stent placement on 01/06/2023 -she started chemo gemcitabine and abraxane on 01/26/2023 -Stable s/p 2 cycles gem/abraxane, tolerating well overall with mild fatigue and mild/early neuropathy. It was recommended she start B complex vitamin. SEs are well managed with supportive care at home, she is able to recover and function well with adequate PS -She has mild cough which appears chronic. Encouraged smoking cessation. Will get CXR to r/o pleural effusion -Exam shows mild pitting edema in the lower legs. She was encouraged to increase dietary protein and use compression stockings and elevate legs when resting.  No strong suspicion for DVT, although she has been off Eliquis a few weeks, will monitor closely -Labs reviewed, adequate to proceed with cycle 3-day 8 gem/Abraxane today, no dose adjustments -restaging CT CAP scheduled for 03/17/2023.   Plan: Labs reviewed.  Persisten and stable anemia.  Normal WBC and ANC.  Improving LFTs. Continue current meds prescription for as needed oxycodone. Referred to [facility], NP, palliative  care for further evaluation and treatment of pain associated with her cancer. Proceed with cycle 3 day 8 of gemcitabine and Abraxane today. Restaging CT CAP scheduled for 03/17/2023. Labs/flush, follow-up, and next treatment as scheduled.  The patient understands the plans discussed today and is in agreement with them.  She knows to contact our office if she develops concerns prior to her next appointment.  I provided 30 minutes of face-to-face time during this encounter and > 50% was spent counseling as documented under my assessment and plan.    [name], NP   CANCER CENTER [facility] - A DEPT OF [facility] [address] [place] [address] Dept: [phone] Dept Fax: [phone]   Orders Placed This Encounter  Procedures   Amb Referral to Palliative Care    Referral Priority:   Routine    Referral Type:   Consultation    Referral Reason:   Symptom Managment    Number of Visits Requested:   1      CHIEF COMPLAINT:  CC: Pancreatic cancer  Current Treatment: Gemcitabine/Abraxane days 1 and 8 of 28-day cycle  INTERVAL HISTORY:  Kathryn Underwood is here today for repeat clinical assessment.  She was last seen by Clayborn Heron, NP on 03/03/2023.  She also received IV Venofer and 500 cc normal saline on day 1 of cycle 3.  Today is cycle 3 day 8.  She continues to have moderate to severe pain in her lower back.  This is well-controlled when she is able to take prescribed oxycodone.  She does need a new prescription for this today.  Continues to have mild congested, nonproductive cough.  Chest x-ray was negative for acute cardiopulmonary issues.  She denies  chest pain, chest pressure, or shortness of breath. She denies headaches or visual disturbances. She does have abdominal pain with nausea. Has not had vomiting, or changes in bowel or bladder habits.  She denies fevers or chills. She denies pain. Her appetite is fair. Her weight has increased  2 pounds over last week .  I have reviewed the past medical history, past surgical history, social history and family history with the patient and they are unchanged from previous note.  ALLERGIES:  has no known allergies.  MEDICATIONS:  Current Outpatient Medications  Medication Sig Dispense Refill   acetaminophen (TYLENOL) 500 MG tablet Take 500-1,000 mg by mouth every 6 (six) hours as needed for moderate pain (pain score 4-6).     apixaban (ELIQUIS) 5 MG TABS tablet Take 1 tablet (5 mg total) by mouth 2 (two) times daily. 60 tablet 1   Ensure Max Protein (ENSURE MAX PROTEIN) LIQD Take 330 mLs (11 oz total) by mouth 2 (two) times daily.     gabapentin (NEURONTIN) 300 MG capsule Take 2 capsules (600 mg total) by mouth 2 (two) times daily. (Patient taking differently: Take 900 mg by mouth at bedtime.) 60 capsule 0   lidocaine-prilocaine (EMLA) cream Apply to affected area once 30 g 3   mirtazapine (REMERON) 45 MG tablet Take 45 mg by mouth at bedtime.     ondansetron (ZOFRAN) 8 MG tablet Take 1 tablet (8 mg total) by mouth every 8 (eight) hours as needed for nausea or vomiting. 30 tablet 1   pantoprazole (PROTONIX) 40 MG tablet Take 1 tablet (40 mg total) by mouth 2 (two) times daily. 180 tablet 0   prochlorperazine (COMPAZINE) 10 MG tablet Take 1 tablet (10 mg total) by mouth every 6 (six) hours as needed for nausea or vomiting. 30 tablet 1   QUEtiapine (SEROQUEL) 100 MG tablet Take 200 mg by mouth at bedtime.     sodium chloride flush (NS) 0.9 % SOLN Use 5 - 10 mLs to flush abdominal drain once daily as directed 300 mL 3   sucralfate (CARAFATE) 1 GM/10ML suspension TAKE 10 MLS (1 G TOTAL) BY MOUTH 4 (FOUR) TIMES DAILY - WITH MEALS AND AT BEDTIME. 420 mL 1   Vitamin D, Ergocalciferol, (DRISDOL) 1.25 MG (50000 UNIT) CAPS capsule Take 1 capsule (50,000 Units total) by mouth every 7 (seven) days. 5 capsule 6   oxyCODONE (OXY IR/ROXICODONE) 5 MG immediate release tablet Take 1 tablet (5 mg total)  by mouth every 4 (four) hours as needed for moderate pain (pain score 4-6). 120 tablet 0   No current facility-administered medications for this visit.    HISTORY OF PRESENT ILLNESS:   Oncology History  Pancreatic adenocarcinoma (HCC)  12/12/2022 Cancer Staging   Staging form: Exocrine Pancreas, AJCC 8th Edition - Clinical stage from 12/12/2022: Stage IIA (cT3, cN0, cM0) - Signed by Malachy Mood, MD on 12/28/2022 Total positive nodes: 0   12/16/2022 Initial Diagnosis   Pancreatic adenocarcinoma (HCC)   01/26/2023 -  Chemotherapy   Patient is on Treatment Plan : PANCREATIC Abraxane D1,8 + Gemcitabine D1,8 q21d         REVIEW OF SYSTEMS:   Constitutional: Denies fevers, chills or abnormal weight loss.  Fatigue.  25 Eyes: Denies blurriness of vision Ears, nose, mouth, throat, and face: Denies mucositis or sore throat Respiratory: denies dyspnea. Has congested, non productive cough.  Cardiovascular: Denies palpitation, chest discomfort or lower extremity swelling Gastrointestinal:  Denies nausea, heartburn or change in bowel habits  Skin: Denies abnormal skin rashes Lymphatics: Denies new lymphadenopathy or easy bruising Neurological:Denies numbness, tingling or new weaknesses Behavioral/Psych: Mood is stable, no new changes  All other systems were reviewed with the patient and are negative.   VITALS:   Today's Vitals   03/10/23 0855  BP: 106/67  Pulse: (!) 104  Resp: 18  Temp: 97.8 F (36.6 C)  TempSrc: Temporal  SpO2: 95%  Weight: 105 lb 6 oz (47.8 kg)   Body mass index is 18.67 kg/m.   Wt Readings from Last 3 Encounters:  03/10/23 105 lb 6 oz (47.8 kg)  03/03/23 103 lb 9.6 oz (47 kg)  02/17/23 107 lb 11.2 oz (48.9 kg)    Body mass index is 18.67 kg/m.  Performance status (ECOG): 1 - Symptomatic but completely ambulatory  PHYSICAL EXAM:   GENERAL:alert, no distress and comfortable SKIN: skin color, texture, turgor are normal, no rashes or significant  lesions EYES: normal, Conjunctiva are pink and non-injected, sclera clear OROPHARYNX:no exudate, no erythema and lips, buccal mucosa, and tongue normal  NECK: supple, thyroid normal size, non-tender, without nodularity LYMPH:  no palpable lymphadenopathy in the cervical, axillary or inguinal LUNGS: clear to auscultation and percussion with normal breathing effort HEART: regular rate & rhythm and no murmurs and no lower extremity edema ABDOMEN:abdomen soft, non-tender and normal bowel sounds Musculoskeletal:no cyanosis of digits and no clubbing  NEURO: alert & oriented x 3 with fluent speech, no focal motor/sensory deficits  LABORATORY DATA:  I have reviewed the data as listed    Component Value Date/Time   NA 138 03/10/2023 0835   K 4.3 03/10/2023 0835   CL 101 03/10/2023 0835   CO2 33 (H) 03/10/2023 0835   GLUCOSE 108 (H) 03/10/2023 0835   BUN 13 03/10/2023 0835   CREATININE 0.41 (L) 03/10/2023 0835   CALCIUM 9.0 03/10/2023 0835   PROT 6.0 (L) 03/10/2023 0835   ALBUMIN 3.3 (L) 03/10/2023 0835   AST 24 03/10/2023 0835   ALT 19 03/10/2023 0835   ALKPHOS 359 (H) 03/10/2023 0835   BILITOT 0.5 03/10/2023 0835   GFRNONAA >60 03/10/2023 0835   GFRAA >60 09/20/2018 1217    Lab Results  Component Value Date   WBC 8.4 03/10/2023   NEUTROABS 5.5 03/10/2023   HGB 9.7 (L) 03/10/2023   HCT 30.9 (L) 03/10/2023   MCV 90.1 03/10/2023   PLT 503 (H) 03/10/2023     RADIOGRAPHIC STUDIES: IR EXCHANGE BILIARY DRAIN Result Date: 03/05/2023 INDICATION: Pancreatic carcinoma with biliary obstruction, status post percutaneous cholecystostomy catheter placement, has been working well, presents for scheduled routine exchange. Patient is interested in possible catheter capping to allow internal drainage EXAM: CHOLECYSTOSTOMY CATHETER EXCHANGE UNDER FLUOROSCOPY PERCUTANEOUS CHOLANGIOGRAM THROUGH EXISTING CATHETER MEDICATIONS: No periprocedural antibiotics were indicated ANESTHESIA/SEDATION: Lidocaine  1% subcutaneous FLUOROSCOPY: Radiation Exposure Index (as provided by the fluoroscopic device): 5 mGy Kerma COMPLICATIONS: None immediate. PROCEDURE: Informed written consent was obtained from the patient after a thorough discussion of the procedural risks, benefits and alternatives. All questions were addressed. Maximal Sterile Barrier Technique was utilized including caps, mask, sterile gowns, sterile gloves, sterile drape, hand hygiene and skin antiseptic. A timeout was performed prior to the initiation of the procedure. The previously placed catheter and surrounding skin prepped and draped in usual sterile fashion. Site infiltrated locally with 1% lidocaine. Iodinated contrast injected through the catheter to the gallbladder is moderately distended. There is no passage of contrast beyond cystic duct. The catheter was cut and exchanged over an Amplatz  wire for a new 10.2 French pigtail drain catheter, formed centrally within the gallbladder. Small contrast injection confirms good positioning into the decompressed gallbladder lumen. Catheter secured externally 0 Prolene suture and StatLock and placed to gravity drain bag. The patient tolerated the procedure well. IMPRESSION: 1. Technically successful exchange of cholecystostomy catheter under fluoroscopy. 2. Cholangiogram demonstrated no flow beyond the cystic duct. Electronically Signed   By: Corlis Leak M.D.   On: 03/05/2023 17:09   DG Chest 2 View Result Date: 03/03/2023 CLINICAL DATA:  [facility]-DGcough, pancreatic cancer EXAM: CHEST - 2 VIEW COMPARISON:  01/03/2023 FINDINGS: Normal cardiac silhouette. No effusion, infiltrate pneumothorax. Lungs are hyperinflated. Port in the anterior chest wall with tip in distal SVC. No acute osseous abnormality. IMPRESSION: Hyperinflated lungs.  No acute findings. Electronically Signed   By: Genevive Bi M.D.   On: 03/03/2023 14:36

## 2023-03-09 NOTE — Assessment & Plan Note (Signed)
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  S/p duodenal stent placement on 01/06/2023 -she started chemo gemcitabine and abraxane on 01/26/2023 -Stable s/p 2 cycles gem/abraxane, tolerating well overall with mild fatigue and mild/early neuropathy. It was recommended she start B complex vitamin. SEs are well managed with supportive care at home, she is able to recover and function well with adequate PS -She has mild cough which appears chronic. Encouraged smoking cessation. Will get CXR to r/o pleural effusion -Exam shows mild pitting edema in the lower legs. She was encouraged to increase dietary protein and use compression stockings and elevate legs when resting.  No strong suspicion for DVT, although she has been off Eliquis a few weeks, will monitor closely -Labs reviewed, adequate to proceed with cycle 3-day 8 gem/Abraxane today, no dose adjustments -restaging CT CAP scheduled for 03/17/2023.

## 2023-03-10 ENCOUNTER — Other Ambulatory Visit (HOSPITAL_COMMUNITY): Payer: Self-pay

## 2023-03-10 ENCOUNTER — Inpatient Hospital Stay: Payer: Medicaid Other

## 2023-03-10 ENCOUNTER — Other Ambulatory Visit: Payer: Self-pay | Admitting: Genetic Counselor

## 2023-03-10 ENCOUNTER — Other Ambulatory Visit: Payer: Self-pay

## 2023-03-10 ENCOUNTER — Inpatient Hospital Stay (HOSPITAL_BASED_OUTPATIENT_CLINIC_OR_DEPARTMENT_OTHER): Payer: Medicaid Other | Admitting: Nurse Practitioner

## 2023-03-10 ENCOUNTER — Inpatient Hospital Stay: Payer: Medicaid Other | Admitting: Dietician

## 2023-03-10 VITALS — HR 99

## 2023-03-10 VITALS — BP 106/67 | HR 104 | Temp 97.8°F | Resp 18 | Wt 105.4 lb

## 2023-03-10 DIAGNOSIS — C259 Malignant neoplasm of pancreas, unspecified: Secondary | ICD-10-CM

## 2023-03-10 DIAGNOSIS — Z5111 Encounter for antineoplastic chemotherapy: Secondary | ICD-10-CM | POA: Diagnosis not present

## 2023-03-10 DIAGNOSIS — E860 Dehydration: Secondary | ICD-10-CM

## 2023-03-10 LAB — CMP (CANCER CENTER ONLY)
ALT: 19 U/L (ref 0–44)
AST: 24 U/L (ref 15–41)
Albumin: 3.3 g/dL — ABNORMAL LOW (ref 3.5–5.0)
Alkaline Phosphatase: 359 U/L — ABNORMAL HIGH (ref 38–126)
Anion gap: 4 — ABNORMAL LOW (ref 5–15)
BUN: 13 mg/dL (ref 8–23)
CO2: 33 mmol/L — ABNORMAL HIGH (ref 22–32)
Calcium: 9 mg/dL (ref 8.9–10.3)
Chloride: 101 mmol/L (ref 98–111)
Creatinine: 0.41 mg/dL — ABNORMAL LOW (ref 0.44–1.00)
GFR, Estimated: >60 mL/min (ref >60)
Glucose, Bld: 108 mg/dL — ABNORMAL HIGH (ref 70–99)
Potassium: 4.3 mmol/L (ref 3.5–5.1)
Sodium: 138 mmol/L (ref 135–145)
Total Bilirubin: 0.5 mg/dL (ref 0.0–1.2)
Total Protein: 6 g/dL — ABNORMAL LOW (ref 6.5–8.1)

## 2023-03-10 LAB — CBC WITH DIFFERENTIAL (CANCER CENTER ONLY)
Abs Immature Granulocytes: 0.08 10*3/uL — ABNORMAL HIGH (ref 0.00–0.07)
Basophils Absolute: 0.1 10*3/uL (ref 0.0–0.1)
Basophils Relative: 1 %
Eosinophils Absolute: 0 10*3/uL (ref 0.0–0.5)
Eosinophils Relative: 0 %
HCT: 30.9 % — ABNORMAL LOW (ref 36.0–46.0)
Hemoglobin: 9.7 g/dL — ABNORMAL LOW (ref 12.0–15.0)
Immature Granulocytes: 1 %
Lymphocytes Relative: 24 %
Lymphs Abs: 2 10*3/uL (ref 0.7–4.0)
MCH: 28.3 pg (ref 26.0–34.0)
MCHC: 31.4 g/dL (ref 30.0–36.0)
MCV: 90.1 fL (ref 80.0–100.0)
Monocytes Absolute: 0.8 10*3/uL (ref 0.1–1.0)
Monocytes Relative: 9 %
Neutro Abs: 5.5 10*3/uL (ref 1.7–7.7)
Neutrophils Relative %: 65 %
Platelet Count: 503 10*3/uL — ABNORMAL HIGH (ref 150–400)
RBC: 3.43 MIL/uL — ABNORMAL LOW (ref 3.87–5.11)
RDW: 17.2 % — ABNORMAL HIGH (ref 11.5–15.5)
WBC Count: 8.4 10*3/uL (ref 4.0–10.5)
nRBC: 0 % (ref 0.0–0.2)

## 2023-03-10 LAB — GENETIC SCREENING ORDER

## 2023-03-10 MED ORDER — SODIUM CHLORIDE 0.9 % IV SOLN
1000.0000 mg/m2 | Freq: Once | INTRAVENOUS | Status: AC
  Administered 2023-03-10: 1406 mg via INTRAVENOUS
  Filled 2023-03-10: qty 36.98

## 2023-03-10 MED ORDER — OXYCODONE HCL 5 MG PO TABS
5.0000 mg | ORAL_TABLET | Freq: Four times a day (QID) | ORAL | 0 refills | Status: DC | PRN
Start: 2023-03-10 — End: 2023-03-10
  Filled 2023-03-10 – 2023-03-15 (×6): qty 120, 30d supply, fill #0

## 2023-03-10 MED ORDER — SODIUM CHLORIDE 0.9% FLUSH
10.0000 mL | Freq: Once | INTRAVENOUS | Status: AC
  Administered 2023-03-10: 10 mL

## 2023-03-10 MED ORDER — SODIUM CHLORIDE 0.9 % IV SOLN
INTRAVENOUS | Status: DC
Start: 2023-03-10 — End: 2023-03-10

## 2023-03-10 MED ORDER — HEPARIN SOD (PORK) LOCK FLUSH 100 UNIT/ML IV SOLN
500.0000 [IU] | Freq: Once | INTRAVENOUS | Status: DC | PRN
Start: 2023-03-10 — End: 2023-03-10

## 2023-03-10 MED ORDER — PROCHLORPERAZINE MALEATE 10 MG PO TABS
10.0000 mg | ORAL_TABLET | Freq: Once | ORAL | Status: AC
  Administered 2023-03-10: 10 mg via ORAL
  Filled 2023-03-10: qty 1

## 2023-03-10 MED ORDER — OXYCODONE HCL 5 MG PO TABS: 5.0000 mg | ORAL_TABLET | ORAL | 0 refills | Status: DC | PRN

## 2023-03-10 MED ORDER — SODIUM CHLORIDE 0.9% FLUSH: 10.0000 mL | INTRAVENOUS | Status: DC | PRN

## 2023-03-10 MED ORDER — PACLITAXEL PROTEIN-BOUND CHEMO INJECTION 100 MG
125.0000 mg/m2 | Freq: Once | INTRAVENOUS | Status: AC
  Administered 2023-03-10: 175 mg via INTRAVENOUS
  Filled 2023-03-10: qty 35

## 2023-03-10 NOTE — Progress Notes (Signed)
Nutrition Follow-up:  Pt with stage II pancreatic cancer. She is receiving gemcitabine + abraxane q28d.   12/8-12/17 admission - partial gastric outlet obstruction, hyponatremia, PNA 11/27-12/1 admission - partial gastric outlet obstruction, portal thrombosis s/p cholecystostomy tube  Met with patient in infusion. She reports good appetite and eating well. Yesterday had cereal with whole milk for breakfast, snacked on cottage cheese/fruit, had chicken pasta for dinner. Patient continues to follow low residue diet which she is compliant with. She is drinking 2 Ensure Enlive (350 kcal, 20g). Patient denies nutrition impact symptoms at this time.   Medications: reviewed   Labs: Cr 0.41, albumin 3.3 (trending up), alk phos 359  Anthropometrics: Wt 105 lb 6 oz today increased   2/5 - 103 lb 9.6 oz 1/15 - 107 lb 3.2 oz  12/30 - 101 lb 11.2 oz    NUTRITION DIAGNOSIS: Unintended wt loss improving   INTERVENTION:  Encourage small frequent meals with adequate calories and protein Continue 2 Ensure Enlive Continue low residue diet - answered nutrition questions     MONITORING, EVALUATION, GOAL: wt trends, intake   NEXT VISIT: Wednesday March 5 during infusion

## 2023-03-10 NOTE — Patient Instructions (Signed)
[facility]  Discharge Instructions: Thank you for choosing [facility] to provide your oncology and hematology care.   If you have a lab appointment with the Cancer Center, please go directly to the Cancer Center and check in at the registration area.   Wear comfortable clothing and clothing appropriate for easy access to any Portacath or PICC line.   We strive to give you quality time with your provider. You may need to reschedule your appointment if you arrive late (15 or more minutes).  Arriving late affects you and other patients whose appointments are after yours.  Also, if you miss three or more appointments without notifying the office, you may be dismissed from the clinic at the provider's discretion.      For prescription refill requests, have your pharmacy contact our office and allow 72 hours for refills to be completed.    Today you received the following chemotherapy and/or immunotherapy agents: Abraxane/Gemzar      To help prevent nausea and vomiting after your treatment, we encourage you to take your nausea medication as directed.  BELOW ARE SYMPTOMS THAT SHOULD BE REPORTED IMMEDIATELY: *FEVER GREATER THAN 100.4 F (38 C) OR HIGHER *CHILLS OR SWEATING *NAUSEA AND VOMITING THAT IS NOT CONTROLLED WITH YOUR NAUSEA MEDICATION *UNUSUAL SHORTNESS OF BREATH *UNUSUAL BRUISING OR BLEEDING *URINARY PROBLEMS (pain or burning when urinating, or frequent urination) *BOWEL PROBLEMS (unusual diarrhea, constipation, pain near the anus) TENDERNESS IN MOUTH AND THROAT WITH OR WITHOUT PRESENCE OF ULCERS (sore throat, sores in mouth, or a toothache) UNUSUAL RASH, SWELLING OR PAIN  UNUSUAL VAGINAL DISCHARGE OR ITCHING   Items with * indicate a potential emergency and should be followed up as soon as possible or go to the Emergency Department if any problems should occur.  Please show the CHEMOTHERAPY ALERT CARD or  IMMUNOTHERAPY ALERT CARD at check-in to the Emergency Department and triage nurse.  Should you have questions after your visit or need to cancel or reschedule your appointment, please contact [facility] - A DEPT OF [facility]  Dept: [phone]  and follow the prompts.  Office hours are 8:00 a.m. to 4:30 p.m. Monday - Friday. Please note that voicemails left after 4:00 p.m. may not be returned until the following business day.  We are closed weekends and major holidays. You have access to a nurse at all times for urgent questions. Please call the main number to the clinic Dept: [phone] and follow the prompts.   For any non-urgent questions, you may also contact your provider using MyChart. We now offer e-Visits for anyone 49 and older to request care online for non-urgent symptoms. For details visit [email or web address].   Also download the MyChart app! Go to the app store, search "MyChart", open the app, select [place], and log in with your MyChart username and password.

## 2023-03-12 ENCOUNTER — Other Ambulatory Visit: Payer: Medicaid Other

## 2023-03-12 ENCOUNTER — Inpatient Hospital Stay: Payer: Medicaid Other | Admitting: Genetic Counselor

## 2023-03-12 ENCOUNTER — Other Ambulatory Visit (HOSPITAL_COMMUNITY): Payer: Self-pay

## 2023-03-12 DIAGNOSIS — Z806 Family history of leukemia: Secondary | ICD-10-CM

## 2023-03-12 DIAGNOSIS — C259 Malignant neoplasm of pancreas, unspecified: Secondary | ICD-10-CM | POA: Diagnosis not present

## 2023-03-12 NOTE — Progress Notes (Signed)
REFERRING PROVIDER: Malachy Mood, MD   PRIMARY PROVIDER:  Allwardt, Crist Infante, PA-C  PRIMARY REASON FOR VISIT:  1. Pancreatic adenocarcinoma (HCC)   2. Family history of leukemia      HISTORY OF PRESENT ILLNESS:   Kathryn Underwood, a 63 y.o. female, was seen for a [place] cancer genetics consultation at the request of Dr. Doloris Hall due to a personal and family history of cancer.  Kathryn Underwood presents to clinic today to discuss the possibility of a hereditary predisposition to cancer, genetic testing, and to further clarify her future cancer risks, as well as potential cancer risks for family members.   In 2024, at the age of 12, Kathryn Underwood was diagnosed with pancreatic adenocarcinoma. She is being treated with chemotherapy. She has a second opinion scheduled at [facility] 03/16/23. She reports a history of two episode of acute pancreatitis, last episode was two years ago. Reports pancreatitis due to a history alcohol abuse.   [facility] CancerNext-Expanded +RNAinsight with STAT BRCAPlus panel for treatment decisions was drawn 03/10/23.  CANCER HISTORY:  Oncology History  Pancreatic adenocarcinoma (HCC)  12/12/2022 Cancer Staging   Staging form: Exocrine Pancreas, AJCC 8th Edition - Clinical stage from 12/12/2022: Stage IIA (cT3, cN0, cM0) - Signed by Malachy Mood, MD on 12/28/2022 Total positive nodes: 0   12/16/2022 Initial Diagnosis   Pancreatic adenocarcinoma (HCC)   01/26/2023 -  Chemotherapy   Patient is on Treatment Plan : PANCREATIC Abraxane D1,8 + Gemcitabine D1,8 q21d        RISK FACTORS:  Menarche was at age 89.  First live birth at age 75.  OCP use for approximately  20  years.  Ovaries intact: yes.  Hysterectomy: no.  Menopausal status: postmenopausal.  HRT use: 0 years. Colonoscopy: no; not examined. Mammogram within the last year: no. Completed one mammogram at 39, none since. Number of breast biopsies: 0. Up to date with pelvic exams: no, reports has not seen GYN in many years   Any excessive radiation exposure in the past: no  Past Medical History:  Diagnosis Date   Alcohol abuse    Anxiety    Chickenpox    Depression    Neuromuscular disorder (HCC)    neuropathy   Pancreatic cancer (HCC)    Substance abuse (HCC)     Past Surgical History:  Procedure Laterality Date   BIOPSY  12/10/2022   Procedure: BIOPSY;  Surgeon: Meridee Score, Netty Starring., MD;  Location: [facility];  Service: Gastroenterology;;   CESAREAN SECTION     DUODENAL STENT PLACEMENT N/A 01/06/2023   Procedure: DUODENAL STENT PLACEMENT;  Surgeon: Lemar Lofty., MD;  Location: [facility];  Service: Gastroenterology;  Laterality: N/A;   ESOPHAGOGASTRODUODENOSCOPY N/A 08/14/2021   Procedure: ESOPHAGOGASTRODUODENOSCOPY (EGD);  Surgeon: Rachael Fee, MD;  Location: [facility];  Service: Gastroenterology;  Laterality: N/A;   ESOPHAGOGASTRODUODENOSCOPY N/A 12/12/2022   Procedure: ESOPHAGOGASTRODUODENOSCOPY (EGD);  Surgeon: Lemar Lofty., MD;  Location: [facility];  Service: Gastroenterology;  Laterality: N/A;   ESOPHAGOGASTRODUODENOSCOPY (EGD) WITH PROPOFOL N/A 12/10/2022   Procedure: ESOPHAGOGASTRODUODENOSCOPY (EGD) WITH PROPOFOL;  Surgeon: Meridee Score Netty Starring., MD;  Location: [facility];  Service: Gastroenterology;  Laterality: N/A;   ESOPHAGOGASTRODUODENOSCOPY (EGD) WITH PROPOFOL N/A 01/06/2023   Procedure: ESOPHAGOGASTRODUODENOSCOPY (EGD) WITH PROPOFOL;  Surgeon: Meridee Score Netty Starring., MD;  Location: [facility];  Service: Gastroenterology;  Laterality: N/A;  with duodenal stent placement   EUS N/A 08/14/2021   Procedure: UPPER ENDOSCOPIC ULTRASOUND (EUS) RADIAL;  Surgeon: Rachael Fee, MD;  Location:  [facility];  Service: Gastroenterology;  Laterality: N/A;   EUS N/A 12/12/2022   Procedure: UPPER ENDOSCOPIC ULTRASOUND (EUS) LINEAR;  Surgeon: Lemar Lofty., MD;  Location: [facility];  Service: Gastroenterology;  Laterality: N/A;   EUS N/A  01/06/2023   Procedure: FULL UPPER ENDOSCOPIC ULTRASOUND (EUS) RADIAL;  Surgeon: Meridee Score Netty Starring., MD;  Location: [facility];  Service: Gastroenterology;  Laterality: N/A;   FINE NEEDLE ASPIRATION N/A 08/14/2021   Procedure: FINE NEEDLE ASPIRATION (FNA) LINEAR;  Surgeon: Rachael Fee, MD;  Location: [facility];  Service: Gastroenterology;  Laterality: N/A;   FINE NEEDLE ASPIRATION N/A 12/12/2022   Procedure: FINE NEEDLE ASPIRATION (FNA) LINEAR;  Surgeon: Lemar Lofty., MD;  Location: [facility];  Service: Gastroenterology;  Laterality: N/A;   FINE NEEDLE ASPIRATION  01/06/2023   Procedure: FINE NEEDLE ASPIRATION;  Surgeon: Meridee Score Netty Starring., MD;  Location: [facility];  Service: Gastroenterology;;   IR EXCHANGE BILIARY DRAIN  01/29/2023   IR EXCHANGE BILIARY DRAIN  03/05/2023   IR IMAGING GUIDED PORT INSERTION  12/18/2022   IR PERC CHOLECYSTOSTOMY  12/13/2022   TONSILLECTOMY      Social History   Socioeconomic History   Marital status: Divorced    Spouse name: Not on file   Number of children: Not on file   Years of education: Not on file   Highest education level: Not on file  Occupational History   Not on file  Tobacco Use   Smoking status: Some Days    Current packs/day: 0.50    Types: Cigarettes   Smokeless tobacco: Never  Vaping Use   Vaping status: Never Used  Substance and Sexual Activity   Alcohol use: Not Currently    Alcohol/week: 16.0 standard drinks of alcohol    Types: 16 Glasses of wine per week    Comment: daily (1.5 liter bottle wine every 12)   Drug use: Not Currently    Types: Cocaine, Marijuana   Sexual activity: Yes    Partners: Male    Comment: female partner  Other Topics Concern   Not on file  Social History Narrative   Divorced. Homemaker. HS grad.    Lives with life partner (stan)   Drinks caffeine.   Wears seatbelt. Smoke dectector in the home. No firearms in the home.    Feels safe in her relationships.      Social Drivers of Corporate investment banker Strain: Not on file  Food Insecurity: No Food Insecurity (01/03/2023)   Hunger Vital Sign    Worried About Running Out of Food in the Last Year: Never true    Ran Out of Food in the Last Year: Never true  Transportation Needs: No Transportation Needs (01/03/2023)   PRAPARE - Administrator, Civil Service (Medical): No    Lack of Transportation (Non-Medical): No  Physical Activity: Not on file  Stress: Not on file  Social Connections: Not on file     FAMILY HISTORY:  We obtained a detailed, 4-generation family history.  Significant diagnoses are listed below: Family History  Problem Relation Age of Onset   Dementia Mother    Arthritis Father    Asthma Father    Asthma Brother    Diabetes Maternal Grandmother    Dementia Maternal Grandmother    Diabetes Paternal Grandmother    Parkinson's disease Maternal Grandfather    Parkinson's disease Paternal Grandfather    Breast cancer Neg Hx    Colon cancer Neg Hx  Kathryn Underwood is unaware of previous family history of genetic testing for hereditary cancer risks. There is no reported Ashkenazi Jewish ancestry. There is no known consanguinity. Kathryn Underwood reports a maternal first cousin diagnosed with leukemia, passed away at age 40.5. She does not report any additional family history of tumors or cancers.     GENETIC COUNSELING ASSESSMENT: Kathryn Underwood is a 63 y.o. female with a personal and family history of cancer which is suggestive of a hereditary predisposition to cancer. We, therefore, discussed and recommended the following at today's visit.   DISCUSSION: We discussed that, in general, most cancer is not inherited in families, but instead is sporadic or familial. Sporadic cancers occur by chance and typically happen at older ages (>50 years) as this type of cancer is caused by genetic changes acquired during an individual's lifetime. Some families have more cancers than  would be expected by chance; however, the ages or types of cancer are not consistent with a known genetic mutation or known genetic mutations have been ruled out. This type of familial cancer is thought to be due to a combination of multiple genetic, environmental, hormonal, and lifestyle factors. While this combination of factors likely increases the risk of cancer, the exact source of this risk is not currently identifiable or testable.  We discussed that 5 - 10% of cancer is hereditary.  We discussed that testing is beneficial for several reasons including knowing how to follow individuals after completing their treatment, identifying whether potential treatment options such as PARP inhibitors would be beneficial, and understand if other family members could be at risk for cancer and allow them to undergo genetic testing.   We reviewed the characteristics, features and inheritance patterns of hereditary cancer syndromes. We also discussed genetic testing, including the appropriate family members to test, the process of testing, insurance coverage and turn-around-time for results. We discussed the implications of a negative, positive, carrier and/or variant of uncertain significant result. Kathryn Underwood  was offered a common hereditary cancer panel (36+ genes) and an expanded pan-cancer panel (70+ genes). Kathryn Underwood was informed of the benefits and limitations of each panel, including that expanded pan-cancer panels contain genes that do not have clear management guidelines at this point in time.  We also discussed that as the number of genes included on a panel increases, the chances of variants of uncertain significance increases. Kathryn Underwood was previously drawn for genetic testing for the 70 gene CancerNext-Expanded +RNAinsight panel, with BRCAPlus STAT panel due to implications for treatment decisions. After review of testing process, she is still in agreement with this plan.    Based on Kathryn Underwood's  personal history of cancer, she meets medical criteria for genetic testing. Despite that she meets criteria, she may still have an out of pocket cost. We discussed that if her out of pocket cost for testing is over $100, the laboratory will call and confirm whether she wants to proceed with testing.  If the out of pocket cost of testing is less than $100 she will be billed by the genetic testing laboratory.   We reviewed the characteristics, features and inheritance patterns of hereditary cancer syndromes. We also discussed genetic testing, including the appropriate family members to test, the process of testing, insurance coverage and turn-around-time for results. We discussed the implications of a negative, positive and/or variant of uncertain significant result.   Kathryn Underwood did share feeling distressed regarding nature of illness, but is trying to remain hopeful. She spoke to  the chaplain at her infusion earlier this week. She has been offered counseling referrals and has declined at this time, but knows she can contact us for assistance if she changes her mind.   PLAN: After considering the risks, benefits, and limitations, Kathryn Underwood provided informed consent to pursue genetic testing and the blood sample is running at [facility] for analysis of the BRCAPlus and CancerNext-Expanded +RNAinsight test. Results should be available within approximately 7-10 days for the STAT result and 2-3 weeks' time for the full panel, at which point they will be disclosed by telephone to Kathryn Underwood, as will any additional recommendations warranted by these results. Kathryn Underwood will receive a summary of her genetic counseling visit and a copy of her results once available. This information will also be available in Epic.   Lastly, we encouraged Kathryn Underwood to remain in contact with cancer genetics annually so that we can continuously update the family history and inform her of any changes in cancer genetics and  testing that may be of benefit for this family.   Kathryn Underwood questions were answered to her satisfaction today. Our contact information was provided should additional questions or concerns arise. Thank you for the referral and allowing [place] to share in the care of your patient.   Vassie Moment, MS, [facility] Licensed, Retail [email or web address] phone: [phone]  40 minutes were spent on the date of the encounter in service to the patient including preparation, face-to-face consultation, documentation and care coordination.  The patient was seen alone. Drs. Meliton Rattan, and/or [place] were available for questions, if needed..    _______________________________________________________________________ For Office Staff:  Number of people involved in session: 1 Was an Intern/ student involved with case: no

## 2023-03-14 ENCOUNTER — Encounter: Payer: Self-pay | Admitting: Nurse Practitioner

## 2023-03-14 ENCOUNTER — Encounter: Payer: Self-pay | Admitting: Hematology

## 2023-03-15 ENCOUNTER — Other Ambulatory Visit (HOSPITAL_COMMUNITY): Payer: Self-pay

## 2023-03-15 ENCOUNTER — Other Ambulatory Visit: Payer: Self-pay

## 2023-03-17 ENCOUNTER — Ambulatory Visit (HOSPITAL_COMMUNITY): Payer: Medicaid Other

## 2023-03-17 ENCOUNTER — Other Ambulatory Visit: Payer: Self-pay | Admitting: Physician Assistant

## 2023-03-18 ENCOUNTER — Encounter: Payer: Self-pay | Admitting: General Practice

## 2023-03-18 NOTE — Progress Notes (Signed)
[facility] Spiritual Care Note  Spoke with Ms Kallstrom for follow-up support, making opportunity for her to process her disappointment about information she received from her second-opinion team. Confirmation that she is not a surgical candidate has made her "mad and sad," she states, and Ms Parrillo is working on Engineer, mining, meaning-making, and Associate Professor with people dear to her.  In particular, she wants to support her children in getting support for their emotional needs through this process. To that end, chaplain spoke with her son Inella Kuwahara to schedule a 1:1 caregiver support session for identifying needs, goals, and follow-up plan (likely a referral for ongoing community-based support). He was very appreciative of quick response.  Plan to follow up with Ms Kesinger at her next infusion, Wednesday 03/24/2023. She knows to reach out if needs arise in the meantime.   [address] Rush [place], [place], [facility] Pager [phone] Voicemail [phone]

## 2023-03-19 ENCOUNTER — Telehealth: Payer: Self-pay | Admitting: Genetic Counselor

## 2023-03-19 ENCOUNTER — Other Ambulatory Visit: Payer: Self-pay

## 2023-03-19 NOTE — Progress Notes (Signed)
Patient called in stating she is in severe pain and is unable to get out of bed without having assistance. She stated her pain is a 10 out of 10. She stated she is at a point she feels like she needs to go to the ER but wanted to call in and see if we could help. This CMA spoke with Judithann Graves NP in Palliative Care  because Dr. Mosetta Putt had sent a referral for her to be seen in Palliative. Jake Samples stated she could take 2 Oxycodone 5mg  every 4 hrs as needed for the pain. Patient voiced full understanding, I also made the patient aware that Jake Samples will see her on Feb 26 at 9am for her pain management. Patient had no further questions at this time.

## 2023-03-22 ENCOUNTER — Encounter: Payer: Self-pay | Admitting: Hematology

## 2023-03-22 ENCOUNTER — Ambulatory Visit: Payer: Self-pay | Admitting: Genetic Counselor

## 2023-03-22 DIAGNOSIS — Z1379 Encounter for other screening for genetic and chromosomal anomalies: Secondary | ICD-10-CM | POA: Insufficient documentation

## 2023-03-22 NOTE — Progress Notes (Signed)
 HPI:  [name] was previously seen in the [facility] due to a personal and family history of cancer and concerns regarding a hereditary predisposition to cancer. Please refer to our prior cancer genetics clinic note for more information regarding our discussion, assessment and recommendations, at the time. [name] recent genetic Kathryn Underwood results were disclosed to her, as were recommendations warranted by these results. These results and recommendations are discussed in more detail below.  Results disclosed to [name] by phone 03/22/23.   CANCER HISTORY:  Oncology History  Pancreatic adenocarcinoma (HCC)  12/12/2022 Cancer Staging   Staging form: Exocrine Pancreas, AJCC 8th Edition - Clinical stage from 12/12/2022: Stage IIA (cT3, cN0, cM0) - Signed by Malachy Mood, MD on 12/28/2022 Total positive nodes: 0   12/16/2022 Initial Diagnosis   Pancreatic adenocarcinoma (HCC)   01/26/2023 -  Chemotherapy   Patient is on Treatment Plan : PANCREATIC Abraxane D1,8 + Gemcitabine D1,8 q21d      Genetic Testing   Negative 76 gene [facility] CancerNext-Expanded +RNAinsight panel. The CancerNext-Expanded gene panel offered by [facility] and includes sequencing, rearrangement, and RNA analysis for the following 76 genes: AIP, ALK, APC, ATM, AXIN2, BAP1, BARD1, BMPR1A, BRCA1, BRCA2, BRIP1, CDC73, CDH1, CDK4, CDKN1B, CDKN2A, CEBPA, CHEK2, CTNNA1, DDX41, DICER1, ETV6, FH, FLCN, GATA2, LZTR1, MAX, MBD4, MEN1, MET, MLH1, MSH2, MSH3, MSH6, MUTYH, NF1, NF2, NTHL1, PALB2, PHOX2B, PMS2, POT1, PRKAR1A, PTCH1, PTEN, RAD51C, RAD51D, RB1, RET, RUNX1, SDHA, SDHAF2, SDHB, SDHC, SDHD, SMAD4, SMARCA4, SMARCB1, SMARCE1, STK11, SUFU, TMEM127, TP53, TSC1, TSC2, VHL, and WT1 (sequencing and deletion/duplication); EGFR, HOXB13, KIT, MITF, PDGFRA, POLD1, and POLE (sequencing only); EPCAM and GREM1 (deletion/duplication only). Report date 03/19/23.      FAMILY HISTORY:  We obtained a detailed, 4-generation  family history.  Significant diagnoses are listed below: Family History  Problem Relation Age of Onset   Dementia Mother    Arthritis Father    Asthma Father    Asthma Brother    Diabetes Maternal Grandmother    Dementia Maternal Grandmother    Diabetes Paternal Grandmother    Parkinson's disease Maternal Grandfather    Parkinson's disease Paternal Grandfather    Breast cancer Neg Hx    Colon cancer Neg Hx     [name] is unaware of previous family history of genetic testing for hereditary cancer risks. There is no reported Ashkenazi Jewish ancestry. There is no known consanguinity. [name] reports a maternal first cousin diagnosed with leukemia, passed away at age 32.5. She does not report any additional family history of tumors or cancers.      GENETIC Kathryn Underwood RESULTS: Genetic testing reported out on 03/19/23 through the [facility] CancerNext-Expanded +RNAinsight panel found no pathogenic mutations. The CancerNext-Expanded gene panel offered by [facility] and includes sequencing, rearrangement, and RNA analysis for the following 76 genes: AIP, ALK, APC, ATM, AXIN2, BAP1, BARD1, BMPR1A, BRCA1, BRCA2, BRIP1, CDC73, CDH1, CDK4, CDKN1B, CDKN2A, CEBPA, CHEK2, CTNNA1, DDX41, DICER1, ETV6, FH, FLCN, GATA2, LZTR1, MAX, MBD4, MEN1, MET, MLH1, MSH2, MSH3, MSH6, MUTYH, NF1, NF2, NTHL1, PALB2, PHOX2B, PMS2, POT1, PRKAR1A, PTCH1, PTEN, RAD51C, RAD51D, RB1, RET, RUNX1, SDHA, SDHAF2, SDHB, SDHC, SDHD, SMAD4, SMARCA4, SMARCB1, SMARCE1, STK11, SUFU, TMEM127, TP53, TSC1, TSC2, VHL, and WT1 (sequencing and deletion/duplication); EGFR, HOXB13, KIT, MITF, PDGFRA, POLD1, and POLE (sequencing only); EPCAM and GREM1 (deletion/duplication only). The Kathryn Underwood report has been scanned into EPIC and is located under the Molecular Pathology section of the Results Review tab.  A portion of the  result report is included below for reference.     We discussed with [name] that because current genetic testing is  not perfect, it is possible there may be a gene mutation in one of these genes that current testing cannot detect, but that chance is small.  We also discussed, that there could be another gene that has not yet been discovered, or that we have not yet tested, that is responsible for the cancer diagnoses in the family. It is also possible there is a hereditary cause for the cancer in the family that [name] did not inherit and therefore was not identified in her testing.  Therefore, it is important to remain in touch with cancer genetics in the future so that we can continue to offer [name] the most up to date genetic testing.   ADDITIONAL GENETIC TESTING: We discussed with [name] that her genetic testing was fairly extensive.  If there are genes identified to increase cancer risk that can be analyzed in the future, we would be happy to discuss and coordinate this testing at that time.    CANCER SCREENING RECOMMENDATIONS: [name] Kathryn Underwood result is considered negative (normal).  This means that we have not identified a hereditary cause for her personal and family history of cancer at this time. Most cancers happen by chance and this negative Kathryn Underwood suggests that her personal and family history of cancer may fall into this category.    Possible reasons for [name]'s negative genetic Kathryn Underwood include:  1. There may be a gene mutation in one of these genes that current testing methods cannot detect but that chance is small.  2. There could be another gene that has not yet been discovered, or that we have not yet tested, that is responsible for the cancer diagnoses in the family.  3.  There may be no hereditary risk for cancer in the family. The cancers in [name] and/or her family may be sporadic/familial or due to other genetic and environmental factors. 4. It is also possible there is a hereditary cause for the cancer in the family that [name] did not inherit.  Therefore, it is  recommended she continue to follow the cancer management and screening guidelines provided by her oncology and primary healthcare provider. An individual's cancer risk and medical management are not determined by genetic Kathryn Underwood results alone. Overall cancer risk assessment incorporates additional factors, including personal medical history, family history, and any available genetic information that may result in a personalized plan for cancer prevention and surveillance  An individual's cancer risk and medical management are not determined by genetic Kathryn Underwood results alone. Overall cancer risk assessment incorporates additional factors, including personal medical history, family history, and any available genetic information that may result in a personalized plan for cancer prevention and surveillance.  RECOMMENDATIONS FOR FAMILY MEMBERS:  Individuals in this family might be at some increased risk of developing cancer, over the general population risk, simply due to the family history of cancer.  We recommended women in this family have a yearly mammogram beginning at age 52, or 56 years younger than the earliest onset of cancer, an annual clinical breast exam, and perform monthly breast self-exams. Women in this family should also have a gynecological exam as recommended by their primary provider. All family members should be referred for colonoscopy starting at age 49, or 42 years younger than the earliest onset of cancer.  FOLLOW-UP: Lastly, we discussed with [name] that cancer genetics  is a rapidly advancing field and it is possible that new genetic tests will be appropriate for her and/or her family members in the future. We encouraged her to remain in contact with cancer genetics on an annual basis so we can update her personal and family histories and let her know of advances in cancer genetics that may benefit this family.   Our contact number was provided. [name] questions were answered to her  satisfaction, and she knows she is welcome to call us at anytime with additional questions or concerns.   Vassie Moment, MS, [facility] Licensed, Retail [email or web address]

## 2023-03-22 NOTE — Telephone Encounter (Signed)
 I spoke to Kathryn Underwood to review results of genetic testing. she had genetic testing with [facility] CancerNext-Expanded +RNA. Testing did not identify any variants known to increase the risk for cancer.  Discussed that we do not know why she has pancreatic cancer or why there is cancer in the family. It could be that cancers in the family occurred by chance or due to environmental factors. It could be due to a different gene that we are not testing, or maybe our current technology may not be able to pick something up.  It will be important for her to keep in contact with genetics to keep up with whether additional testing may be needed.  Please see counseling note for further detail on this result.

## 2023-03-23 NOTE — Assessment & Plan Note (Addendum)
 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  S/p duodenal stent placement on 01/06/2023 -she started chemo gemcitabine and abraxane on 01/26/2023 -Stable s/p 2 cycles gem/abraxane, tolerating well overall with mild fatigue and mild/early neuropathy. It was recommended she start B complex vitamin. SEs are well managed with supportive care at home, she is able to recover and function well with adequate PS -She has mild cough which appears chronic. Encouraged smoking cessation. Will get CXR to r/o pleural effusion -Exam shows mild pitting edema in the lower legs. She was encouraged to increase dietary protein and use compression stockings and elevate legs when resting.  No strong suspicion for DVT, although she has been off Eliquis a few weeks, will monitor closely -Labs reviewed, adequate to proceed with cycle 3 day 8 gem/Abraxane today, no dose adjustments -Patient did have CT CAP done at [facility] for restaging. This showed  1. Local advanced infiltrative mass centered within the pancreatic head with extensive vascular involvement. 2. A 7 mm left upper lobe pulmonary nodule is indeterminate. 3. Moderate biliary dilation with obstruction of the level of the extra hepatic bile ducts and possibly owing to obstruction from the pancreatic head mass and/or inflammatory stricture. 4. Multiple pancreatic collections, largest anterior to the pancreatic body, possibly related to prior pancreatitis. 5. Ovoid nodule  within the superior right breast is indeterminant.  -Will get imaging from [facility] to compare with most recent imaging from [facility] to compare and better determine response to treatment and prognosis. -03/24/2023 -we will proceed with cycle 4 day 1 of gemcitabine and Abraxane.

## 2023-03-23 NOTE — Progress Notes (Unsigned)
 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  S/p duodenal stent placement on 01/06/2023 -she started chemo gemcitabine and abraxane on 01/26/2023 -Stable s/p 2 cycles gem/abraxane, tolerating well overall with mild fatigue and mild/early neuropathy. It was recommended she start B complex vitamin. SEs are well managed with supportive care at home, she is able to recover and function well with adequate PS -She has mild cough which appears chronic. Encouraged smoking cessation. Will get CXR to r/o pleural effusion -Exam shows mild pitting edema in the lower legs. She was encouraged to increase dietary protein and use compression stockings and elevate legs when resting.  No strong suspicion for DVT, although she has been off Eliquis a few weeks, will monitor closely -Labs reviewed, adequate to proceed with cycle 3 day 8 gem/Abraxane today, no dose adjustments -Patient did have CT CAP done at [facility] for restaging. This showed  1. Local advanced infiltrative mass centered within the  pancreatic head with extensive vascular involvement. 2. A 7 mm left upper lobe pulmonary nodule is indeterminate. 3. Moderate biliary dilation with obstruction of the level of the extra hepatic bile ducts and possibly owing to obstruction from the pancreatic head mass and/or inflammatory stricture. 4. Multiple pancreatic collections, largest anterior to the pancreatic body, possibly related to prior pancreatitis. 5. Ovoid nodule within the superior right breast is indeterminant.  -Will get imaging from [facility] to compare with most recent imaging from [facility] center to compare and better determine response to treatment and prognosis. -03/24/2023 -we will proceed with cycle 4 day 1 of gemcitabine and Abraxane.  Thrombocytosis Felt to be reactive.  Will get iron and iron binding panel for further evaluation. Will treat any iron deficiency as indicated.  Cough Improved.   Discussed 7 mm pulmonary nodule found on CT CAP.  Very small and indeterminate nature.   Will monitor closely.  Abdominal pain related to pancreatic cancer Patient now established with Lowella Bandy, NP, palliative care, who is managing patient's pain.   Plan:  Labs reviewed with patient and her husband. Mild and stable anemia. Improved iron binding panel.  -adequate for treatment today  Reviewed results of CT CAP, done at [facility].  -not compared to the CT done here previously, so difficult to tell if here is disease improvement.  -Most recent Ca 19.9 improved. This is positive sign for disease response to treatment. Will get images from [facility] to compare them with most recent imaging here and make better determination of disease response and prognosis.  Proceed with treatment as scheduled today.  Labs/flush, follow up, and treatment in 2 weeks as scheduled.     The patient understands the plans discussed today and is in agreement with  them.  She knows to contact our office if she develops concerns prior  to her next appointment.  I provided 30 minutes of face-to-face time during this encounter and > 50% was spent counseling as documented under my assessment and plan.    [name], NP  [facility] - A DEPT OF [facility] [address] [place] [address] Dept: [phone] Dept Fax: [phone]   Orders Placed This Encounter  Procedures   Iron and Iron Binding Capacity ([facility] only)    Standing Status:   Future    Number of Occurrences:   1    Expected Date:   03/24/2023    Expiration Date:   03/23/2024      CHIEF COMPLAINT:  CC: Pancreatic adenocarcinoma  Current Treatment: Gemcitabine and Abraxane  INTERVAL HISTORY:  Kathryn Underwood is here today for repeat clinical assessment.  Today starts cycle 4 day 1 of gemcitabine and Abraxane.  Has had stable anemia since prior to chemotherapy initiation.  Genetic testing showed no pathogenic mutations associated with her cancer.  Patient did have CT CAP done at [facility] for restaging. This showed   1. Local advanced infiltrative mass centered within the pancreatic head with extensive vascular involvement.  2. A 7 mm left upper lobe pulmonary nodule is indeterminate. 3. Moderate biliary dilation with obstruction of the level of the extra hepatic bile ducts and possibly owing to obstruction from the pancreatic head mass and/or inflammatory stricture. 4. Multiple pancreatic collections, largest anterior to the pancreatic body, possibly related to prior pancreatitis. 5. Ovoid nodule within the superior right breast is indeterminant. She denies chest pain, chest pressure, or shortness of breath.  She reports improvement in chronic cough.  She denies headaches or visual disturbances. She does reports moderate abdominal pain with distention, especially after eating.  She denies fevers or chills. She denies pain. Her appetite is good. Her weight has been stable.  I  have reviewed the past medical history, past surgical history, social history and family history with the patient and they are unchanged from previous note.  ALLERGIES:  has no known allergies.  MEDICATIONS:  Current Outpatient Medications  Medication Sig Dispense Refill   acetaminophen (TYLENOL) 500 MG tablet Take 500-1,000 mg by mouth every 6 (six) hours as needed for moderate pain (pain score 4-6).     apixaban (ELIQUIS) 5 MG TABS tablet Take 1 tablet (5 mg total) by mouth 2 (two) times daily. 60 tablet 1   Ensure Max Protein (ENSURE MAX PROTEIN) LIQD Take 330 mLs (11 oz total) by mouth 2 (two) times daily.     lidocaine-prilocaine (EMLA) cream Apply to affected area once 30 g 3   ondansetron (ZOFRAN) 8 MG tablet Take 1 tablet (8 mg total) by mouth every 8 (eight) hours as needed for nausea or vomiting. 30 tablet 1   pantoprazole (PROTONIX) 40 MG tablet Take 1 tablet (40 mg total) by mouth 2 (two) times daily. 180 tablet 0   prochlorperazine (COMPAZINE) 10 MG tablet Take 1 tablet (10 mg total) by mouth every 6 (six) hours as needed for nausea or vomiting. 30 tablet 1   sodium chloride flush (NS) 0.9 % SOLN Use 5 - 10 mLs to flush abdominal drain once daily as directed 300 mL 3   sucralfate (CARAFATE) 1 GM/10ML suspension TAKE 10 MLS (1 G TOTAL) BY MOUTH 4 (FOUR) TIMES DAILY - WITH MEALS AND AT BEDTIME. 420  mL 1   Vitamin D, Ergocalciferol, (DRISDOL) 1.25 MG (50000 UNIT) CAPS capsule Take 1 capsule (50,000 Units total) by mouth every 7 (seven) days. 5 capsule 6   gabapentin (NEURONTIN) 300 MG capsule Take 2 capsules (600 mg total) by mouth 2 (two) times daily. 120 capsule 3   mirtazapine (REMERON) 45 MG tablet Take 1 tablet (45 mg total) by mouth at bedtime. 30 tablet 3   oxyCODONE 10 MG TABS Take 1 tablet (10 mg total) by mouth every 4 (four) hours as needed for severe pain (pain score 7-10). 90 tablet 0   QUEtiapine (SEROQUEL) 100 MG tablet Take 2 tablets (200 mg total) by mouth at bedtime.  30 tablet 3   No current facility-administered medications for this visit.   Facility-Administered Medications Ordered in Other Visits  Medication Dose Route Frequency Provider Last Rate Last Admin   0.9 %  sodium chloride infusion   Intravenous Continuous Malachy Mood, MD   Stopped at 03/24/23 1424    HISTORY OF PRESENT ILLNESS:   Oncology History  Pancreatic adenocarcinoma (HCC)  12/12/2022 Cancer Staging   Staging form: Exocrine Pancreas, AJCC 8th Edition - Clinical stage from 12/12/2022: Stage IIA (cT3, cN0, cM0) - Signed by Malachy Mood, MD on 12/28/2022 Total positive nodes: 0   12/16/2022 Initial Diagnosis   Pancreatic adenocarcinoma (HCC)   01/26/2023 -  Chemotherapy   Patient is on Treatment Plan : PANCREATIC Abraxane D1,8 + Gemcitabine D1,8 q21d      Genetic Testing   Negative 76 gene [facility] CancerNext-Expanded +RNAinsight panel. The CancerNext-Expanded gene panel offered by [facility] and includes sequencing, rearrangement, and RNA analysis for the following 76 genes: AIP, ALK, APC, ATM, AXIN2, BAP1, BARD1, BMPR1A, BRCA1, BRCA2, BRIP1, CDC73, CDH1, CDK4, CDKN1B, CDKN2A, CEBPA, CHEK2, CTNNA1, DDX41, DICER1, ETV6, FH, FLCN, GATA2, LZTR1, MAX, MBD4, MEN1, MET, MLH1, MSH2, MSH3, MSH6, MUTYH, NF1, NF2, NTHL1, PALB2, PHOX2B, PMS2, POT1, PRKAR1A, PTCH1, PTEN, RAD51C, RAD51D, RB1, RET, RUNX1, SDHA, SDHAF2, SDHB, SDHC, SDHD, SMAD4, SMARCA4, SMARCB1, SMARCE1, STK11, SUFU, TMEM127, TP53, TSC1, TSC2, VHL, and WT1 (sequencing and deletion/duplication); EGFR, HOXB13, KIT, MITF, PDGFRA, POLD1, and POLE (sequencing only); EPCAM and GREM1 (deletion/duplication only). Report date 03/19/23.    03/16/2023 Imaging   CT CAP with pancreatic protocol and CTA imaging - done [facility] Impression:  1. Local advanced infiltrative mass centered within the pancreatic head with extensive vascular involvement, as above.   2. A 7 mm left upper lobe pulmonary nodule is indeterminate.  3. Moderate  biliary dilation with obstruction of the level of the extra hepatic bile ducts and possibly owing to obstruction from the pancreatic head mass and/or inflammatory stricture.   4. Multiple pancreatic collections, largest anterior to the pancreatic body, possibly related to prior pancreatitis.  5. Ovoid nodule within the superior right breast is indeterminant. Consider further evaluation with dedicated mammogram as clinically appropriate for this patient.         REVIEW OF SYSTEMS:   Constitutional: Denies fevers, chills or abnormal weight loss. Moderate fatigue Eyes: Denies blurriness of vision Ears, nose, mouth, throat, and face: Denies mucositis or sore throat Respiratory: Denies cough, dyspnea or wheezes Cardiovascular: Denies palpitation, chest discomfort or lower extremity swelling Gastrointestinal:  Denies nausea, heartburn or change in bowel habits.  Continues to have abdominal pain with distention.  Most severe after eating. Skin: Denies abnormal skin rashes Lymphatics: Denies new lymphadenopathy or easy bruising Neurological:Denies numbness, tingling or new weaknesses Behavioral/Psych: Mood is stable, no new changes  All  other systems were reviewed with the patient and are negative.   VITALS:   Today's Vitals   03/24/23 1128  BP: 112/71  Pulse: (!) 110  Resp: 16  Temp: 99.6 F (37.6 C)  SpO2: 95%  Weight: 109 lb (49.4 kg)   Body mass index is 19.31 kg/m.   Wt Readings from Last 3 Encounters:  03/24/23 109 lb (49.4 kg)  03/24/23 109 lb 12.8 oz (49.8 kg)  03/10/23 105 lb 6 oz (47.8 kg)    Body mass index is 19.31 kg/m.  Performance status (ECOG): 1 - Symptomatic but completely ambulatory  PHYSICAL EXAM:   GENERAL:alert, no distress and comfortable SKIN: skin color, texture, turgor are normal, no rashes or significant lesions EYES: normal, Conjunctiva are pink and non-injected, sclera clear OROPHARYNX:no exudate, no erythema and lips, buccal mucosa, and  tongue normal  NECK: supple, thyroid normal size, non-tender, without nodularity LYMPH:  no palpable lymphadenopathy in the cervical, axillary or inguinal LUNGS: clear to auscultation and percussion with normal breathing effort HEART: regular rate & rhythm and no murmurs and no lower extremity edema ABDOMEN: Bowel sounds present in all 4 quadrants.  Abdomen firm and distended. Musculoskeletal:no cyanosis of digits and no clubbing  NEURO: alert & oriented x 3 with fluent speech, no focal motor/sensory deficits  LABORATORY DATA:  I have reviewed the data as listed    Component Value Date/Time   NA 136 03/24/2023 1014   K 3.9 03/24/2023 1014   CL 103 03/24/2023 1014   CO2 29 03/24/2023 1014   GLUCOSE 160 (H) 03/24/2023 1014   BUN 12 03/24/2023 1014   CREATININE 0.44 03/24/2023 1014   CALCIUM 8.5 (L) 03/24/2023 1014   PROT 5.4 (L) 03/24/2023 1014   ALBUMIN 3.0 (L) 03/24/2023 1014   AST 15 03/24/2023 1014   ALT 15 03/24/2023 1014   ALKPHOS 354 (H) 03/24/2023 1014   BILITOT 0.4 03/24/2023 1014   GFRNONAA >60 03/24/2023 1014   GFRAA >60 09/20/2018 1217   Lab Results  Component Value Date   WBC 7.2 03/24/2023   NEUTROABS 3.5 03/24/2023   HGB 9.9 (L) 03/24/2023   HCT 31.2 (L) 03/24/2023   MCV 93.7 03/24/2023   PLT 930 (HH) 03/24/2023      RADIOGRAPHIC STUDIES: IR EXCHANGE BILIARY DRAIN Result Date: 03/05/2023 INDICATION: Pancreatic carcinoma with biliary obstruction, status post percutaneous cholecystostomy catheter placement, has been working well, presents for scheduled routine exchange. Patient is interested in possible catheter capping to allow internal drainage EXAM: CHOLECYSTOSTOMY CATHETER EXCHANGE UNDER FLUOROSCOPY PERCUTANEOUS CHOLANGIOGRAM THROUGH EXISTING CATHETER MEDICATIONS: No periprocedural antibiotics were indicated ANESTHESIA/SEDATION: Lidocaine 1% subcutaneous FLUOROSCOPY: Radiation Exposure Index (as provided by the fluoroscopic device): 5 mGy Kerma COMPLICATIONS:  None immediate. PROCEDURE: Informed written consent was obtained from the patient after a thorough discussion of the procedural risks, benefits and alternatives. All questions were addressed. Maximal Sterile Barrier Technique was utilized including caps, mask, sterile gowns, sterile gloves, sterile drape, hand hygiene and skin antiseptic. A timeout was performed prior to the initiation of the procedure. The previously placed catheter and surrounding skin prepped and draped in usual sterile fashion. Site infiltrated locally with 1% lidocaine. Iodinated contrast injected through the catheter to the gallbladder is moderately distended. There is no passage of contrast beyond cystic duct. The catheter was cut and exchanged over an Amplatz wire for a new 10.2 French pigtail drain catheter, formed centrally within the gallbladder. Small contrast injection confirms good positioning into the decompressed gallbladder lumen. Catheter secured externally 0  Prolene suture and StatLock and placed to gravity drain bag. The patient tolerated the procedure well. IMPRESSION: 1. Technically successful exchange of cholecystostomy catheter under fluoroscopy. 2. Cholangiogram demonstrated no flow beyond the cystic duct. Electronically Signed   By: Corlis Leak M.D.   On: 03/05/2023 17:09   DG Chest 2 View Result Date: 03/03/2023 CLINICAL DATA:  [facility], pancreatic cancer EXAM: CHEST - 2 VIEW COMPARISON:  01/03/2023 FINDINGS: Normal cardiac silhouette. No effusion, infiltrate pneumothorax. Lungs are hyperinflated. Port in the anterior chest wall with tip in distal SVC. No acute osseous abnormality. IMPRESSION: Hyperinflated lungs.  No acute findings. Electronically Signed   By: Genevive Bi M.D.   On: 03/03/2023 14:36   Addendum I have seen the patient, examined her. I agree with the assessment and and plan and have edited the notes.   Ollie was seen by pancreatic surgeon Dr. Kathrynn Ducking at [facility], and had a repeated a CT  scan at [facility].  Unfortunately Dr. Elbert Ewings confirmed the unresectability of her pancreatic cancer, due to the extensive vascular involvement.  Her tumor marker CA 19.9 at [facility] was much lower than her previous result months ago.  Will request her scan to be pushed to our system, so we can compare to her previous CT scan.  Lab reviewed, adequate for treatment, she is clinically doing better, will continue current therapy.  All questions were answered.  I spent a total of 25 minutes for her visit today, more than 50% time on face-to-face counseling.  Malachy Mood MD 03/24/2023

## 2023-03-23 NOTE — Progress Notes (Unsigned)
 Palliative Medicine [facility]  Telephone:(336) [phone] [phone]) [phone]   Name: Kathryn Underwood Date: 03/23/2023 MRN: [id number]  DOB: [birth year]  Patient Care Team: Allwardt, Crist Infante, PA-C as PCP - General (Physician Assistant) Geraldine Contras, MD as Referring Physician (Psychiatry) Malachy Mood, MD as Consulting Physician (Hematology and Oncology)    REASON FOR CONSULTATION: Kathryn Underwood is a 63 y.o. female with oncologic medical history including pancreatic adenocarcinoma (11/2022) and is status post duodenal stent placement on 01/06/2023. Patient is not a surgical candidate and is currently on palliative systemic therapy of gemcitabine and abraxane.  Palliative ask to see for symptom management and goals of care.    SOCIAL HISTORY:     reports that she has been smoking cigarettes. She has never used smokeless tobacco. She reports that she does not currently use alcohol after a past usage of about 16.0 standard drinks of alcohol per week. She reports that she does not currently use drugs after having used the following drugs: Cocaine and Marijuana.  ADVANCE DIRECTIVES:  None on file  CODE STATUS: DNR  PAST MEDICAL HISTORY: Past Medical History:  Diagnosis Date   Alcohol abuse    Anxiety    Chickenpox    Depression    Neuromuscular disorder (HCC)    neuropathy   Pancreatic cancer (HCC)    Substance abuse (HCC)     PAST SURGICAL HISTORY:  Past Surgical History:  Procedure Laterality Date   BIOPSY  12/10/2022   Procedure: BIOPSY;  Surgeon: Mansouraty, Netty Starring., MD;  Location: [facility];  Service: Gastroenterology;;   CESAREAN SECTION     DUODENAL STENT PLACEMENT N/A 01/06/2023   Procedure: DUODENAL STENT PLACEMENT;  Surgeon: Lemar Lofty., MD;  Location: [facility];  Service: Gastroenterology;  Laterality: N/A;   ESOPHAGOGASTRODUODENOSCOPY N/A 08/14/2021   Procedure: ESOPHAGOGASTRODUODENOSCOPY (EGD);  Surgeon: Rachael Fee,  MD;  Location: [facility];  Service: Gastroenterology;  Laterality: N/A;   ESOPHAGOGASTRODUODENOSCOPY N/A 12/12/2022   Procedure: ESOPHAGOGASTRODUODENOSCOPY (EGD);  Surgeon: Lemar Lofty., MD;  Location: [facility];  Service: Gastroenterology;  Laterality: N/A;   ESOPHAGOGASTRODUODENOSCOPY (EGD) WITH PROPOFOL N/A 12/10/2022   Procedure: ESOPHAGOGASTRODUODENOSCOPY (EGD) WITH PROPOFOL;  Surgeon: Meridee Score Netty Starring., MD;  Location: [facility];  Service: Gastroenterology;  Laterality: N/A;   ESOPHAGOGASTRODUODENOSCOPY (EGD) WITH PROPOFOL N/A 01/06/2023   Procedure: ESOPHAGOGASTRODUODENOSCOPY (EGD) WITH PROPOFOL;  Surgeon: Meridee Score Netty Starring., MD;  Location: [facility];  Service: Gastroenterology;  Laterality: N/A;  with duodenal stent placement   EUS N/A 08/14/2021   Procedure: UPPER ENDOSCOPIC ULTRASOUND (EUS) RADIAL;  Surgeon: Rachael Fee, MD;  Location: [facility];  Service: Gastroenterology;  Laterality: N/A;   EUS N/A 12/12/2022   Procedure: UPPER ENDOSCOPIC ULTRASOUND (EUS) LINEAR;  Surgeon: Lemar Lofty., MD;  Location: [facility];  Service: Gastroenterology;  Laterality: N/A;   EUS N/A 01/06/2023   Procedure: FULL UPPER ENDOSCOPIC ULTRASOUND (EUS) RADIAL;  Surgeon: Meridee Score Netty Starring., MD;  Location: [facility];  Service: Gastroenterology;  Laterality: N/A;   FINE NEEDLE ASPIRATION N/A 08/14/2021   Procedure: FINE NEEDLE ASPIRATION (FNA) LINEAR;  Surgeon: Rachael Fee, MD;  Location: [facility];  Service: Gastroenterology;  Laterality: N/A;   FINE NEEDLE ASPIRATION N/A 12/12/2022   Procedure: FINE NEEDLE ASPIRATION (FNA) LINEAR;  Surgeon: Lemar Lofty., MD;  Location: [facility];  Service: Gastroenterology;  Laterality: N/A;   FINE NEEDLE ASPIRATION  01/06/2023   Procedure: FINE NEEDLE ASPIRATION;  Surgeon: Meridee Score Netty Starring., MD;  Location:  [facility];  Service: Gastroenterology;;   IR EXCHANGE BILIARY DRAIN  01/29/2023    IR EXCHANGE BILIARY DRAIN  03/05/2023   IR IMAGING GUIDED PORT INSERTION  12/18/2022   IR PERC CHOLECYSTOSTOMY  12/13/2022   TONSILLECTOMY      HEMATOLOGY/ONCOLOGY HISTORY:  Oncology History  Pancreatic adenocarcinoma (HCC)  12/12/2022 Cancer Staging   Staging form: Exocrine Pancreas, AJCC 8th Edition - Clinical stage from 12/12/2022: Stage IIA (cT3, cN0, cM0) - Signed by Malachy Mood, MD on 12/28/2022 Total positive nodes: 0   12/16/2022 Initial Diagnosis   Pancreatic adenocarcinoma (HCC)   01/26/2023 -  Chemotherapy   Patient is on Treatment Plan : PANCREATIC Abraxane D1,8 + Gemcitabine D1,8 q21d      Genetic Testing   Negative 76 gene [facility] CancerNext-Expanded +RNAinsight panel. The CancerNext-Expanded gene panel offered by [facility] and includes sequencing, rearrangement, and RNA analysis for the following 76 genes: AIP, ALK, APC, ATM, AXIN2, BAP1, BARD1, BMPR1A, BRCA1, BRCA2, BRIP1, CDC73, CDH1, CDK4, CDKN1B, CDKN2A, CEBPA, CHEK2, CTNNA1, DDX41, DICER1, ETV6, FH, FLCN, GATA2, LZTR1, MAX, MBD4, MEN1, MET, MLH1, MSH2, MSH3, MSH6, MUTYH, NF1, NF2, NTHL1, PALB2, PHOX2B, PMS2, POT1, PRKAR1A, PTCH1, PTEN, RAD51C, RAD51D, RB1, RET, RUNX1, SDHA, SDHAF2, SDHB, SDHC, SDHD, SMAD4, SMARCA4, SMARCB1, SMARCE1, STK11, SUFU, TMEM127, TP53, TSC1, TSC2, VHL, and WT1 (sequencing and deletion/duplication); EGFR, HOXB13, KIT, MITF, PDGFRA, POLD1, and POLE (sequencing only); EPCAM and GREM1 (deletion/duplication only). Report date 03/19/23.      ALLERGIES:  has no known allergies.  MEDICATIONS:  Current Outpatient Medications  Medication Sig Dispense Refill   acetaminophen (TYLENOL) 500 MG tablet Take 500-1,000 mg by mouth every 6 (six) hours as needed for moderate pain (pain score 4-6).     apixaban (ELIQUIS) 5 MG TABS tablet Take 1 tablet (5 mg total) by mouth 2 (two) times daily. 60 tablet 1   Ensure Max Protein (ENSURE MAX PROTEIN) LIQD Take 330 mLs (11 oz total) by mouth 2 (two) times daily.      gabapentin (NEURONTIN) 300 MG capsule Take 2 capsules (600 mg total) by mouth 2 (two) times daily. (Patient taking differently: Take 900 mg by mouth at bedtime.) 60 capsule 0   lidocaine-prilocaine (EMLA) cream Apply to affected area once 30 g 3   mirtazapine (REMERON) 45 MG tablet Take 45 mg by mouth at bedtime.     ondansetron (ZOFRAN) 8 MG tablet Take 1 tablet (8 mg total) by mouth every 8 (eight) hours as needed for nausea or vomiting. 30 tablet 1   oxyCODONE (OXY IR/ROXICODONE) 5 MG immediate release tablet Take 1 tablet (5 mg total) by mouth every 4 (four) hours as needed for moderate pain (pain score 4-6). 120 tablet 0   pantoprazole (PROTONIX) 40 MG tablet Take 1 tablet (40 mg total) by mouth 2 (two) times daily. 180 tablet 0   prochlorperazine (COMPAZINE) 10 MG tablet Take 1 tablet (10 mg total) by mouth every 6 (six) hours as needed for nausea or vomiting. 30 tablet 1   QUEtiapine (SEROQUEL) 100 MG tablet Take 200 mg by mouth at bedtime.     sodium chloride flush (NS) 0.9 % SOLN Use 5 - 10 mLs to flush abdominal drain once daily as directed 300 mL 3   sucralfate (CARAFATE) 1 GM/10ML suspension TAKE 10 MLS (1 G TOTAL) BY MOUTH 4 (FOUR) TIMES DAILY - WITH MEALS AND AT BEDTIME. 420 mL 1   Vitamin D, Ergocalciferol, (DRISDOL) 1.25 MG (50000 UNIT) CAPS capsule Take 1 capsule (50,000 Units total)  by mouth every 7 (seven) days. 5 capsule 6   No current facility-administered medications for this visit.    VITAL SIGNS: There were no vitals taken for this visit. There were no vitals filed for this visit.  Estimated body mass index is 18.67 kg/m as calculated from the following:   Height as of 01/29/23: 5\' 3"  (1.6 m).   Weight as of 03/10/23: 105 lb 6 oz (47.8 kg).  LABS: CBC:    Component Value Date/Time   WBC 8.4 03/10/2023 0835   WBC 7.9 01/29/2023 0855   HGB 9.7 (L) 03/10/2023 0835   HCT 30.9 (L) 03/10/2023 0835   PLT 503 (H) 03/10/2023 0835   MCV 90.1 03/10/2023 0835   NEUTROABS  5.5 03/10/2023 0835   LYMPHSABS 2.0 03/10/2023 0835   MONOABS 0.8 03/10/2023 0835   EOSABS 0.0 03/10/2023 0835   BASOSABS 0.1 03/10/2023 0835   Comprehensive Metabolic Panel:    Component Value Date/Time   NA 138 03/10/2023 0835   K 4.3 03/10/2023 0835   CL 101 03/10/2023 0835   CO2 33 (H) 03/10/2023 0835   BUN 13 03/10/2023 0835   CREATININE 0.41 (L) 03/10/2023 0835   GLUCOSE 108 (H) 03/10/2023 0835   CALCIUM 9.0 03/10/2023 0835   AST 24 03/10/2023 0835   ALT 19 03/10/2023 0835   ALKPHOS 359 (H) 03/10/2023 0835   BILITOT 0.5 03/10/2023 0835   PROT 6.0 (L) 03/10/2023 0835   ALBUMIN 3.3 (L) 03/10/2023 0835    RADIOGRAPHIC STUDIES:  PERFORMANCE STATUS (ECOG) : {CH[facility] ONC ECOG PS:[id number]}  Review of Systems Unless otherwise noted, a complete review of systems is negative.  Physical Exam General: NAD Cardiovascular: regular rate and rhythm Pulmonary: clear ant fields Abdomen: soft, nontender, + bowel sounds Extremities: no edema, no joint deformities Skin: no rashes Neurological: Alert and oriented x3  IMPRESSION: *** I introduced myself, Maygan RN, and Palliative's role in collaboration with the oncology team. Concept of Palliative Care was introduced as specialized medical care for people and their families living with serious illness.  It focuses on providing relief from the symptoms and stress of a serious illness.  The goal is to improve quality of life for both the patient and the family. Values and goals of care important to patient and family were attempted to be elicited.    We discussed *** current illness and what it means in the larger context of *** on-going co-morbidities. Natural disease trajectory and expectations were discussed.  I discussed the importance of continued conversation with family and their medical providers regarding overall plan of care and treatment options, ensuring decisions are within the context of the patients values and  GOCs.  PLAN: Established therapeutic relationship. Education provided on palliative's role in collaboration with their Oncology/Radiation team. I will plan to see patient back in 2-4 weeks in collaboration to other oncology appointments.    Patient expressed understanding and was in agreement with this plan. She also understands that She can call the clinic at any time with any questions, concerns, or complaints.   Thank you for your referral and allowing Palliative to assist in Kathryn Underwood's care.   Number and complexity of problems addressed: ***HIGH - 1 or more chronic illnesses with SEVERE exacerbation, progression, or side effects of treatment - advanced cancer, pain. Any controlled substances utilized were prescribed in the context of palliative care.   Visit consisted of counseling and education dealing with the complex and emotionally intense issues of symptom management  and palliative care in the setting of serious and potentially life-threatening illness.  Signed by: Willette Alma, AGPCNP-BC Palliative Medicine Team/[facility]

## 2023-03-24 ENCOUNTER — Inpatient Hospital Stay: Payer: Medicaid Other

## 2023-03-24 ENCOUNTER — Other Ambulatory Visit: Payer: Medicaid Other

## 2023-03-24 ENCOUNTER — Telehealth: Payer: Self-pay

## 2023-03-24 ENCOUNTER — Encounter: Payer: Self-pay | Admitting: Nurse Practitioner

## 2023-03-24 ENCOUNTER — Inpatient Hospital Stay (HOSPITAL_BASED_OUTPATIENT_CLINIC_OR_DEPARTMENT_OTHER): Payer: Medicaid Other | Admitting: Nurse Practitioner

## 2023-03-24 VITALS — BP 112/71 | HR 110 | Temp 99.6°F | Resp 16 | Wt 109.0 lb

## 2023-03-24 VITALS — BP 112/71 | HR 110 | Temp 99.6°F | Resp 16 | Wt 109.8 lb

## 2023-03-24 DIAGNOSIS — C259 Malignant neoplasm of pancreas, unspecified: Secondary | ICD-10-CM

## 2023-03-24 DIAGNOSIS — E860 Dehydration: Secondary | ICD-10-CM

## 2023-03-24 DIAGNOSIS — F332 Major depressive disorder, recurrent severe without psychotic features: Secondary | ICD-10-CM | POA: Diagnosis not present

## 2023-03-24 DIAGNOSIS — R630 Anorexia: Secondary | ICD-10-CM

## 2023-03-24 DIAGNOSIS — F32A Depression, unspecified: Secondary | ICD-10-CM

## 2023-03-24 DIAGNOSIS — F418 Other specified anxiety disorders: Secondary | ICD-10-CM | POA: Diagnosis not present

## 2023-03-24 DIAGNOSIS — R7989 Other specified abnormal findings of blood chemistry: Secondary | ICD-10-CM

## 2023-03-24 DIAGNOSIS — Z515 Encounter for palliative care: Secondary | ICD-10-CM

## 2023-03-24 DIAGNOSIS — G893 Neoplasm related pain (acute) (chronic): Secondary | ICD-10-CM

## 2023-03-24 DIAGNOSIS — Z7189 Other specified counseling: Secondary | ICD-10-CM

## 2023-03-24 DIAGNOSIS — R530 Neoplastic (malignant) related fatigue: Secondary | ICD-10-CM

## 2023-03-24 DIAGNOSIS — Z5111 Encounter for antineoplastic chemotherapy: Secondary | ICD-10-CM | POA: Diagnosis not present

## 2023-03-24 LAB — CBC WITH DIFFERENTIAL (CANCER CENTER ONLY)
Abs Immature Granulocytes: 0.09 10*3/uL — ABNORMAL HIGH (ref 0.00–0.07)
Basophils Absolute: 0.1 10*3/uL (ref 0.0–0.1)
Basophils Relative: 2 %
Eosinophils Absolute: 0.2 10*3/uL (ref 0.0–0.5)
Eosinophils Relative: 3 %
HCT: 31.2 % — ABNORMAL LOW (ref 36.0–46.0)
Hemoglobin: 9.9 g/dL — ABNORMAL LOW (ref 12.0–15.0)
Immature Granulocytes: 1 %
Lymphocytes Relative: 24 %
Lymphs Abs: 1.7 10*3/uL (ref 0.7–4.0)
MCH: 29.7 pg (ref 26.0–34.0)
MCHC: 31.7 g/dL (ref 30.0–36.0)
MCV: 93.7 fL (ref 80.0–100.0)
Monocytes Absolute: 1.6 10*3/uL — ABNORMAL HIGH (ref 0.1–1.0)
Monocytes Relative: 22 %
Neutro Abs: 3.5 10*3/uL (ref 1.7–7.7)
Neutrophils Relative %: 48 %
Platelet Count: 930 10*3/uL (ref 150–400)
RBC: 3.33 MIL/uL — ABNORMAL LOW (ref 3.87–5.11)
RDW: 20.2 % — ABNORMAL HIGH (ref 11.5–15.5)
WBC Count: 7.2 10*3/uL (ref 4.0–10.5)
nRBC: 0 % (ref 0.0–0.2)

## 2023-03-24 LAB — IRON AND IRON BINDING CAPACITY (CC-WL,HP ONLY)
Iron: 56 ug/dL (ref 28–170)
Saturation Ratios: 25 % (ref 10.4–31.8)
TIBC: 227 ug/dL — ABNORMAL LOW (ref 250–450)
UIBC: 171 ug/dL (ref 148–442)

## 2023-03-24 LAB — CMP (CANCER CENTER ONLY)
ALT: 15 U/L (ref 0–44)
AST: 15 U/L (ref 15–41)
Albumin: 3 g/dL — ABNORMAL LOW (ref 3.5–5.0)
Alkaline Phosphatase: 354 U/L — ABNORMAL HIGH (ref 38–126)
Anion gap: 4 — ABNORMAL LOW (ref 5–15)
BUN: 12 mg/dL (ref 8–23)
CO2: 29 mmol/L (ref 22–32)
Calcium: 8.5 mg/dL — ABNORMAL LOW (ref 8.9–10.3)
Chloride: 103 mmol/L (ref 98–111)
Creatinine: 0.44 mg/dL (ref 0.44–1.00)
GFR, Estimated: >60 mL/min (ref >60)
Glucose, Bld: 160 mg/dL — ABNORMAL HIGH (ref 70–99)
Potassium: 3.9 mmol/L (ref 3.5–5.1)
Sodium: 136 mmol/L (ref 135–145)
Total Bilirubin: 0.4 mg/dL (ref 0.0–1.2)
Total Protein: 5.4 g/dL — ABNORMAL LOW (ref 6.5–8.1)

## 2023-03-24 MED ORDER — MIRTAZAPINE 45 MG PO TABS
45.0000 mg | ORAL_TABLET | Freq: Every day | ORAL | 3 refills | Status: DC
Start: 2023-03-24 — End: 2023-06-03

## 2023-03-24 MED ORDER — QUETIAPINE FUMARATE 100 MG PO TABS: 200.0000 mg | ORAL_TABLET | Freq: Every day | ORAL | 3 refills | Status: DC

## 2023-03-24 MED ORDER — PROCHLORPERAZINE MALEATE 10 MG PO TABS
10.0000 mg | ORAL_TABLET | Freq: Once | ORAL | Status: AC
  Administered 2023-03-24: 10 mg via ORAL
  Filled 2023-03-24: qty 1

## 2023-03-24 MED ORDER — OXYCODONE HCL 10 MG PO TABS
10.0000 mg | ORAL_TABLET | ORAL | 0 refills | Status: DC | PRN
Start: 2023-03-24 — End: 2023-04-09

## 2023-03-24 MED ORDER — PACLITAXEL PROTEIN-BOUND CHEMO INJECTION 100 MG
125.0000 mg/m2 | Freq: Once | INTRAVENOUS | Status: AC
  Administered 2023-03-24: 175 mg via INTRAVENOUS
  Filled 2023-03-24: qty 35

## 2023-03-24 MED ORDER — GABAPENTIN 300 MG PO CAPS
600.0000 mg | ORAL_CAPSULE | Freq: Two times a day (BID) | ORAL | 3 refills | Status: DC
Start: 2023-03-24 — End: 2023-05-28

## 2023-03-24 MED ORDER — OXYCODONE HCL 10 MG PO TABA
10.0000 mg | ORAL_TABLET | ORAL | 0 refills | Status: DC | PRN
Start: 2023-03-24 — End: 2023-03-24

## 2023-03-24 MED ORDER — SODIUM CHLORIDE 0.9 % IV SOLN
INTRAVENOUS | Status: DC
Start: 2023-03-24 — End: 2023-03-24

## 2023-03-24 MED ORDER — OXYCODONE HCL 5 MG PO TABS
5.0000 mg | ORAL_TABLET | ORAL | 0 refills | Status: DC | PRN
Start: 2023-03-24 — End: 2023-03-24

## 2023-03-24 MED ORDER — SODIUM CHLORIDE 0.9 % IV SOLN
1000.0000 mg/m2 | Freq: Once | INTRAVENOUS | Status: AC
  Administered 2023-03-24: 1406 mg via INTRAVENOUS
  Filled 2023-03-24: qty 36.98

## 2023-03-24 MED ORDER — SODIUM CHLORIDE 0.9% FLUSH
10.0000 mL | Freq: Once | INTRAVENOUS | Status: AC
  Administered 2023-03-24: 10 mL

## 2023-03-24 MED ORDER — IRON SUCROSE 20 MG/ML IV SOLN
200.0000 mg | Freq: Once | INTRAVENOUS | Status: AC
  Administered 2023-03-24: 200 mg via INTRAVENOUS
  Filled 2023-03-24: qty 10

## 2023-03-24 NOTE — Patient Instructions (Signed)
 [facility] - A DEPT OF [facility]  Discharge Instructions: Thank you for choosing [facility] to provide your oncology and hematology care.   If you have a lab appointment with the Cancer Center, please go directly to the Cancer Center and check in at the registration area.   Wear comfortable clothing and clothing appropriate for easy access to any Portacath or PICC line.   We strive to give you quality time with your provider. You may need to reschedule your appointment if you arrive late (15 or more minutes).  Arriving late affects you and other patients whose appointments are after yours.  Also, if you miss three or more appointments without notifying the office, you may be dismissed from the clinic at the provider's discretion.      For prescription refill requests, have your pharmacy contact our office and allow 72 hours for refills to be completed.    Today you received the following chemotherapy and/or immunotherapy agents: Abraxane/Gemzar      To help prevent nausea and vomiting after your treatment, we encourage you to take your nausea medication as directed.  BELOW ARE SYMPTOMS THAT SHOULD BE REPORTED IMMEDIATELY: *FEVER GREATER THAN 100.4 F (38 C) OR HIGHER *CHILLS OR SWEATING *NAUSEA AND VOMITING THAT IS NOT CONTROLLED WITH YOUR NAUSEA MEDICATION *UNUSUAL SHORTNESS OF BREATH *UNUSUAL BRUISING OR BLEEDING *URINARY PROBLEMS (pain or burning when urinating, or frequent urination) *BOWEL PROBLEMS (unusual diarrhea, constipation, pain near the anus) TENDERNESS IN MOUTH AND THROAT WITH OR WITHOUT PRESENCE OF ULCERS (sore throat, sores in mouth, or a toothache) UNUSUAL RASH, SWELLING OR PAIN  UNUSUAL VAGINAL DISCHARGE OR ITCHING   Items with * indicate a potential emergency and should be followed up as soon as possible or go to the Emergency Department if any problems should occur.  Please show the CHEMOTHERAPY ALERT CARD or  IMMUNOTHERAPY ALERT CARD at check-in to the Emergency Department and triage nurse.  Should you have questions after your visit or need to cancel or reschedule your appointment, please contact [facility] - A DEPT OF [facility]  Dept: [phone]  and follow the prompts.  Office hours are 8:00 a.m. to 4:30 p.m. Monday - Friday. Please note that voicemails left after 4:00 p.m. may not be returned until the following business day.  We are closed weekends and major holidays. You have access to a nurse at all times for urgent questions. Please call the main number to the clinic Dept: [phone] and follow the prompts.   For any non-urgent questions, you may also contact your provider using MyChart. We now offer e-Visits for anyone 49 and older to request care online for non-urgent symptoms. For details visit [email or web address].   Also download the MyChart app! Go to the app store, search "MyChart", open the app, select [place], and log in with your MyChart username and password.

## 2023-03-24 NOTE — Telephone Encounter (Signed)
 CRITICAL VALUE STICKER  CRITICAL VALUE: Platelets [phone]  RECEIVER (on-site recipient of call): Sharlette Dense CMA  DATE & TIME NOTIFIED: 03/24/2023 1043  MESSENGER (representative from lab): Melissa  MD NOTIFIED: Hermenia Fiscal NP  TIME OF NOTIFICATION: 1043  RESPONSE: Made physician

## 2023-03-25 ENCOUNTER — Encounter: Payer: Self-pay | Admitting: General Practice

## 2023-03-25 LAB — CANCER ANTIGEN 19-9: CA 19-9: 202 U/mL — ABNORMAL HIGH (ref 0–35)

## 2023-03-25 NOTE — Progress Notes (Signed)
 [facility] Spiritual Care Note  Left follow-up voicemail with direct number and encouragement to return call whenever helpful.   [address] [place], [place], [facility] Pager [phone] Voicemail [phone]

## 2023-03-29 ENCOUNTER — Telehealth: Payer: Self-pay

## 2023-03-29 ENCOUNTER — Other Ambulatory Visit: Payer: Self-pay

## 2023-03-29 ENCOUNTER — Encounter: Payer: Self-pay | Admitting: Hematology

## 2023-03-29 ENCOUNTER — Other Ambulatory Visit: Payer: Self-pay | Admitting: Hematology

## 2023-03-29 MED ORDER — POTASSIUM CHLORIDE CRYS ER 10 MEQ PO TBCR: 10.0000 meq | EXTENDED_RELEASE_TABLET | Freq: Every day | ORAL | 0 refills | Status: DC

## 2023-03-29 MED ORDER — FUROSEMIDE 20 MG PO TABS: 20.0000 mg | ORAL_TABLET | Freq: Every day | ORAL | 0 refills | Status: DC

## 2023-03-29 NOTE — Telephone Encounter (Signed)
 Spoke with patient via telephone call regarding blood in stool.  Patient stated she had a BM and there was bright blood on the tissue paper after wiping, and bright red at the tip of the stool in the toilet.  Patient denies being constipation. Patient states she does have a history of hemorrhoids. Patient stated she blew her nose yesterday and there was some bright red blood mixed with the mucous from one nostril. Patient states she has a history of sinus issues. Patient stated she was unsure if these symptoms was related to the Provastatin. Telephone call was interrupted by a visitor at the patient's door. Patient requested a callback later today to finish discussing above and to discuss the legs concerns. Will callback later today to further discuss this matter.

## 2023-03-29 NOTE — Telephone Encounter (Signed)
 Spoke with patient via telephone call regarding the swelling in both legs.  Patient reports swelling in both legs from the calf down to her toes. Patient reports both legs are red and splotchy and the skin is very tight that it is uncomfortable.  Patient states she has been wearing compression socks around the clock, patient states she has been keeping her feet elevated, and drinking at least 84 oz of water daily. Spoke with Dr. Mosetta Putt. Let patient know that Dr. Mosetta Putt wants her to continue wearing compression socks, elevating her feet, drinking water, and she is sending potassium and lasix to the pharmacy. Let patient know that we will re-assess at her OV, 03/31/23. Patient voiced understanding. Patient requesting Rx be sent to [facility].

## 2023-03-29 NOTE — Assessment & Plan Note (Signed)
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 -She was readmitted to hospital for recurrent nausea and vomiting, likely partial gastric outlet obstruction from pancreatic tumor.  She is able to tolerate full liquid diet, she underwet duodenal stent placement on 01/06/2023 -she started chemo gemcitabine and abraxane on 01/26/2023  -her scan was reviewed by Dr. Freida Busman and her cancer was deemed to be unresectable, this is confirmed by Dr. Kathrynn Ducking at [facility]

## 2023-03-30 NOTE — Progress Notes (Unsigned)
 Palliative Medicine [facility]  [phone] [phone]   Name: Kathryn Underwood Date: 03/30/2023 MRN: [id number]  DOB: [birth year]  Patient Care Team: Allwardt, Crist Infante, PA-C as PCP - General (Physician Assistant) Geraldine Contras, MD as Referring Physician (Psychiatry) Malachy Mood, MD as Consulting Physician (Hematology and Oncology) Pickenpack-Cousar, Arty Baumgartner, NP as Nurse Practitioner [facility] and Palliative Medicine)    INTERVAL HISTORY: Kathryn Underwood is a 63 y.o. female with oncologic medical history including pancreatic adenocarcinoma (11/2022) and is status post duodenal stent placement on 01/06/2023. Patient is not a surgical candidate and is currently on palliative systemic therapy of gemcitabine and abraxane.  Palliative ask to see for symptom management and goals of care.   SOCIAL HISTORY:     reports that she has been smoking cigarettes. She has never used smokeless tobacco. She reports that she does not currently use alcohol after a past usage of about 16.0 standard drinks of alcohol per week. She reports that she does not currently use drugs after having used the following drugs: Cocaine and Marijuana.  ADVANCE DIRECTIVES:  None on file  CODE STATUS: DNR  PAST MEDICAL HISTORY: Past Medical History:  Diagnosis Date   Alcohol abuse    Anxiety    Chickenpox    Depression    Neuromuscular disorder (HCC)    neuropathy   Pancreatic cancer (HCC)    Substance abuse (HCC)     ALLERGIES:  has no known allergies.  MEDICATIONS:  Current Outpatient Medications  Medication Sig Dispense Refill   acetaminophen (TYLENOL) 500 MG tablet Take 500-1,000 mg by mouth every 6 (six) hours as needed for moderate pain (pain score 4-6).     apixaban (ELIQUIS) 5 MG TABS tablet Take 1 tablet (5 mg total) by mouth 2 (two) times daily. 60 tablet 1   Ensure Max Protein (ENSURE MAX PROTEIN) LIQD Take 330 mLs (11 oz total) by mouth 2 (two) times daily.      furosemide (LASIX) 20 MG tablet Take 1 tablet (20 mg total) by mouth daily. For 5 days then as needed for leg edema 20 tablet 0   gabapentin (NEURONTIN) 300 MG capsule Take 2 capsules (600 mg total) by mouth 2 (two) times daily. 120 capsule 3   lidocaine-prilocaine (EMLA) cream Apply to affected area once 30 g 3   mirtazapine (REMERON) 45 MG tablet Take 1 tablet (45 mg total) by mouth at bedtime. 30 tablet 3   ondansetron (ZOFRAN) 8 MG tablet Take 1 tablet (8 mg total) by mouth every 8 (eight) hours as needed for nausea or vomiting. 30 tablet 1   oxyCODONE 10 MG TABS Take 1 tablet (10 mg total) by mouth every 4 (four) hours as needed for severe pain (pain score 7-10). 90 tablet 0   pantoprazole (PROTONIX) 40 MG tablet Take 1 tablet (40 mg total) by mouth 2 (two) times daily. 180 tablet 0   potassium chloride (KLOR-CON M) 10 MEQ tablet Take 1 tablet (10 mEq total) by mouth daily. When you take lasix 20 tablet 0   prochlorperazine (COMPAZINE) 10 MG tablet Take 1 tablet (10 mg total) by mouth every 6 (six) hours as needed for nausea or vomiting. 30 tablet 1   QUEtiapine (SEROQUEL) 100 MG tablet Take 2 tablets (200 mg total) by mouth at bedtime. 30 tablet 3   sodium chloride flush (NS) 0.9 % SOLN Use 5 - 10 mLs to flush abdominal drain once daily as directed 300 mL 3  sucralfate (CARAFATE) 1 GM/10ML suspension TAKE 10 MLS (1 G TOTAL) BY MOUTH 4 (FOUR) TIMES DAILY - WITH MEALS AND AT BEDTIME. 420 mL 1   Vitamin D, Ergocalciferol, (DRISDOL) 1.25 MG (50000 UNIT) CAPS capsule Take 1 capsule (50,000 Units total) by mouth every 7 (seven) days. 5 capsule 6   No current facility-administered medications for this visit.    VITAL SIGNS: There were no vitals taken for this visit. There were no vitals filed for this visit.  Estimated body mass index is 19.31 kg/m as calculated from the following:   Height as of 01/29/23: 5\' 3"  (1.6 m).   Weight as of 03/24/23: 109 lb (49.4 kg).   PERFORMANCE STATUS (ECOG)  : 1 - Symptomatic but completely ambulatory  Discussed the use of AI scribe software for clinical note transcription with the patient, who gave verbal consent to proceed.  IMPRESSION:  I connected by phone with Kathryn Underwood for symptom management follow-up. No acute distress noted. She is receiving treatment today. Denies concerns with nausea, vomiting, constipation, or diarrhea. Appetite fluctuates. Some days better than others. She recently celebrated her birthday with family and friends, receiving visits and gifts from loved ones. She values spending time with family and friends, expressing a desire to see them while she is still able. She has a supportive social network, including a neighbor who assists her with meals and clothing. She has a brother who lives on [address] and has recently reconnected with him after a period of estrangement.   Kathryn Underwood reports her pain is controlled on current regimen. Some days pain tends to be more severe however she is able to manage. Her recent increase in pain was exacerbated by physical activity, such as cleaning her house in preparation for a family visit. This activity led to increased pain and swelling in her legs, which she attributes to overexertion. She has been prescribed Lasix and potassium to manage the swelling, which was causing significant discomfort in her legs.  She experiences significant pain, particularly at night, describing it as 'shooting through me' when she coughs, which disrupts her sleep. She woke up multiple times during the night, including at 4 AM, feeling as though it should be later in the morning. Despite these challenges, her pain tends to be managed when taking medications as prescribed.   We discussed her regimen at length. She is currently taking oxycodone for pain management and reports that it is effective in alleviating her pain. She also takes gabapentin and is doing well with it. She emphasizes the need to balance  her time on her feet to prevent swelling and manage her back pain. No adjustments to regimen at this time.   We will continue to closely monitor and support.  I discussed the importance of continued conversation with family and their medical providers regarding overall plan of care and treatment options, ensuring decisions are within the context of the patients values and GOCs.   Assessment and Plan  Cancer Related Pain Reports of severe pain episodes, particularly at night. Pain is managed with Oxycodone, which the patient reports as effective. Overexertion and stress may be contributing to pain exacerbations. -Continue Oxycodone 10mg  every 4 hours as needed.  -Continue Gabapentin 900mg  at bedtime  -Encourage patient to balance time on feet and rest to manage pain and swelling.  Edema Reports of leg swelling causing discomfort. Recently started on Lasix and potassium for management. -Continue Lasix and potassium as prescribed per medical team.  -Encourage patient  to limit time on feet to manage swelling.  Gastroesophageal Reflux Disease (GERD) Patient is on Protonix twice daily, which is due for a refill. -Refill Protonix prescription.  Neuropathic Pain Patient is on Gabapentin, which she reports as effective. -Continue Gabapentin as prescribed.  Depression: Managed with Mirtazapine 45mg  daily and Quetiapine. -Refill Mirtazapine 45 mg at bedtime  - Quetiapine 100 mg at bedtime   Goals of care: Extensive goals of care discussion. She is clear in her expressed wishes to continue to treat the treatable allow her every opportunity to continue to thrive while focusing on her quality of life.  Her quality of life is most important versus quantity.  -Patient and significant other are in the process of completing advanced directives with their attorney.  States she has an appointment on Monday. -She is aware if she needs assistance with completing her healthcare advanced directives this  service is offered here at the cancer center.  Extensive education provided on document. -Patient is clear in her expressed wishes for no life-sustaining measures as her desire is for natural death.  Confirms DNR/DNI.  General Health Maintenance / Follow-up Plans -Encourage patient to continue seeing friends and family for emotional support. -Schedule follow-up appointment in 3-4 weeks.  Patient expressed understanding and was in agreement with this plan. She also understands that She can call the clinic at any time with any questions, concerns, or complaints.   Any controlled substances utilized were prescribed in the context of palliative care. PDMP has been reviewed.    Visit consisted of counseling and education dealing with the complex and emotionally intense issues of symptom management and palliative care in the setting of serious and potentially life-threatening illness.  Willette Alma, AGPCNP-BC  Palliative Medicine Team/[facility]

## 2023-03-31 ENCOUNTER — Encounter: Payer: Self-pay | Admitting: Nurse Practitioner

## 2023-03-31 ENCOUNTER — Inpatient Hospital Stay: Payer: Medicaid Other | Attending: Hematology | Admitting: Hematology

## 2023-03-31 ENCOUNTER — Encounter: Payer: Self-pay | Admitting: Hematology

## 2023-03-31 ENCOUNTER — Inpatient Hospital Stay: Payer: Medicaid Other

## 2023-03-31 ENCOUNTER — Inpatient Hospital Stay: Payer: Medicaid Other | Attending: Hematology | Admitting: Dietician

## 2023-03-31 ENCOUNTER — Inpatient Hospital Stay: Payer: Medicaid Other | Attending: Hematology

## 2023-03-31 VITALS — BP 101/59 | HR 96 | Resp 16

## 2023-03-31 VITALS — BP 94/50 | HR 101 | Temp 97.6°F | Resp 17 | Wt 112.1 lb

## 2023-03-31 DIAGNOSIS — Z7189 Other specified counseling: Secondary | ICD-10-CM | POA: Diagnosis not present

## 2023-03-31 DIAGNOSIS — R530 Neoplastic (malignant) related fatigue: Secondary | ICD-10-CM | POA: Diagnosis not present

## 2023-03-31 DIAGNOSIS — C7989 Secondary malignant neoplasm of other specified sites: Secondary | ICD-10-CM | POA: Insufficient documentation

## 2023-03-31 DIAGNOSIS — C250 Malignant neoplasm of head of pancreas: Secondary | ICD-10-CM | POA: Diagnosis not present

## 2023-03-31 DIAGNOSIS — G893 Neoplasm related pain (acute) (chronic): Secondary | ICD-10-CM | POA: Diagnosis not present

## 2023-03-31 DIAGNOSIS — C259 Malignant neoplasm of pancreas, unspecified: Secondary | ICD-10-CM | POA: Diagnosis not present

## 2023-03-31 DIAGNOSIS — R197 Diarrhea, unspecified: Secondary | ICD-10-CM | POA: Diagnosis not present

## 2023-03-31 DIAGNOSIS — E860 Dehydration: Secondary | ICD-10-CM

## 2023-03-31 DIAGNOSIS — Z5111 Encounter for antineoplastic chemotherapy: Secondary | ICD-10-CM | POA: Diagnosis present

## 2023-03-31 DIAGNOSIS — Z515 Encounter for palliative care: Secondary | ICD-10-CM | POA: Diagnosis not present

## 2023-03-31 LAB — CBC WITH DIFFERENTIAL (CANCER CENTER ONLY)
Abs Immature Granulocytes: 0.05 10*3/uL (ref 0.00–0.07)
Basophils Absolute: 0.1 10*3/uL (ref 0.0–0.1)
Basophils Relative: 1 %
Eosinophils Absolute: 0.1 10*3/uL (ref 0.0–0.5)
Eosinophils Relative: 1 %
HCT: 28.7 % — ABNORMAL LOW (ref 36.0–46.0)
Hemoglobin: 9.2 g/dL — ABNORMAL LOW (ref 12.0–15.0)
Immature Granulocytes: 1 %
Lymphocytes Relative: 18 %
Lymphs Abs: 1.5 10*3/uL (ref 0.7–4.0)
MCH: 29.2 pg (ref 26.0–34.0)
MCHC: 32.1 g/dL (ref 30.0–36.0)
MCV: 91.1 fL (ref 80.0–100.0)
Monocytes Absolute: 0.7 10*3/uL (ref 0.1–1.0)
Monocytes Relative: 8 %
Neutro Abs: 6 10*3/uL (ref 1.7–7.7)
Neutrophils Relative %: 71 %
Platelet Count: 532 10*3/uL — ABNORMAL HIGH (ref 150–400)
RBC: 3.15 MIL/uL — ABNORMAL LOW (ref 3.87–5.11)
RDW: 19.9 % — ABNORMAL HIGH (ref 11.5–15.5)
WBC Count: 8.3 10*3/uL (ref 4.0–10.5)
nRBC: 0 % (ref 0.0–0.2)

## 2023-03-31 LAB — CMP (CANCER CENTER ONLY)
ALT: 11 U/L (ref 0–44)
AST: 17 U/L (ref 15–41)
Albumin: 2.9 g/dL — ABNORMAL LOW (ref 3.5–5.0)
Alkaline Phosphatase: 340 U/L — ABNORMAL HIGH (ref 38–126)
Anion gap: 5 (ref 5–15)
BUN: 10 mg/dL (ref 8–23)
CO2: 29 mmol/L (ref 22–32)
Calcium: 8.5 mg/dL — ABNORMAL LOW (ref 8.9–10.3)
Chloride: 100 mmol/L (ref 98–111)
Creatinine: 0.36 mg/dL — ABNORMAL LOW (ref 0.44–1.00)
GFR, Estimated: >60 mL/min (ref >60)
Glucose, Bld: 129 mg/dL — ABNORMAL HIGH (ref 70–99)
Potassium: 4 mmol/L (ref 3.5–5.1)
Sodium: 134 mmol/L — ABNORMAL LOW (ref 135–145)
Total Bilirubin: 0.4 mg/dL (ref 0.0–1.2)
Total Protein: 5.5 g/dL — ABNORMAL LOW (ref 6.5–8.1)

## 2023-03-31 MED ORDER — SODIUM CHLORIDE 0.9% FLUSH
10.0000 mL | INTRAVENOUS | Status: DC | PRN
Start: 2023-03-31 — End: 2023-03-31
  Administered 2023-03-31: 10 mL

## 2023-03-31 MED ORDER — SODIUM CHLORIDE 0.9 % IV SOLN
1000.0000 mg/m2 | Freq: Once | INTRAVENOUS | Status: AC
  Administered 2023-03-31: 1406 mg via INTRAVENOUS
  Filled 2023-03-31: qty 36.98

## 2023-03-31 MED ORDER — PACLITAXEL PROTEIN-BOUND CHEMO INJECTION 100 MG
125.0000 mg/m2 | Freq: Once | INTRAVENOUS | Status: AC
  Administered 2023-03-31: 175 mg via INTRAVENOUS
  Filled 2023-03-31: qty 35

## 2023-03-31 MED ORDER — HEPARIN SOD (PORK) LOCK FLUSH 100 UNIT/ML IV SOLN
500.0000 [IU] | Freq: Once | INTRAVENOUS | Status: AC | PRN
  Administered 2023-03-31: 500 [IU]

## 2023-03-31 MED ORDER — PANTOPRAZOLE SODIUM 40 MG PO TBEC: 40.0000 mg | DELAYED_RELEASE_TABLET | Freq: Two times a day (BID) | ORAL | 3 refills | Status: DC

## 2023-03-31 MED ORDER — SODIUM CHLORIDE 0.9 % IV SOLN: INTRAVENOUS | Status: DC

## 2023-03-31 MED ORDER — SODIUM CHLORIDE 0.9% FLUSH
10.0000 mL | Freq: Once | INTRAVENOUS | Status: AC
  Administered 2023-03-31: 10 mL

## 2023-03-31 MED ORDER — PROCHLORPERAZINE MALEATE 10 MG PO TABS
10.0000 mg | ORAL_TABLET | Freq: Once | ORAL | Status: AC
  Administered 2023-03-31: 10 mg via ORAL
  Filled 2023-03-31: qty 1

## 2023-03-31 NOTE — Progress Notes (Signed)
 Nutrition Follow-up:  Pt with stage II pancreatic cancer. She is receiving gemcitabine + abraxane q28d.    12/8-12/17 admission - partial gastric outlet obstruction, hyponatremia, PNA 11/27-12/1 admission - partial gastric outlet obstruction, portal thrombosis s/p cholecystostomy tube  Met with patient in infusion. She reports good appetite and eating well. Patient is pleased weights are improving. Patient denies nausea, vomiting, diarrhea, constipation. Patient shares disappointed with second opinion at [facility] regarding resection. She has a good support system who surrounds her with positivity.    Medications: reviewed   Labs: Na 134, glucose 129, Cr 0.36, albumin 2.9  Anthropometrics: Wt 112 lb 1.6 oz increased   NUTRITION DIAGNOSIS: Unintended wt loss improving    INTERVENTION:  Encourage small frequent meals with adequate calories and protein Continue 2 Ensure Enlive  Support and encouragement   MONITORING, EVALUATION, GOAL: wt trends, intake   NEXT VISIT: Wednesday April 9 during infusion

## 2023-03-31 NOTE — Progress Notes (Signed)
 [facility]   [phone] [phone]   Clinic Follow up Note   Patient Care Team: Allwardt, Crist Infante, PA-C as PCP - General (Physician Assistant) Geraldine Contras, MD as Referring Physician (Psychiatry) Malachy Mood, MD as Consulting Physician (Hematology and Oncology) Pickenpack-Cousar, Arty Baumgartner, NP as Nurse Practitioner [facility] and Palliative Medicine)  Date of Service:  03/31/2023  CHIEF COMPLAINT: f/u of pancreatic cancer  CURRENT THERAPY:  First-line chemotherapy gemcitabine and Abraxane  Oncology History   Pancreatic adenocarcinoma (HCC) -cT3N0M0 in pancreatic neck  -Diagnosed in November 2024 -She presented with recurrent abdominal pain, nausea, pancreatitis to hospital -Abdominal CT and MRI showed pancreatitis, large mass in the neck and head of pancreas with involvement of SMA and SMV, biopsy confirmed adenocarcinoma. -She was readmitted to hospital for recurrent nausea and vomiting, likely partial gastric outlet obstruction from pancreatic tumor.  She is able to tolerate full liquid diet, she underwet duodenal stent placement on 01/06/2023 -she started chemo gemcitabine and abraxane on 01/26/2023  -her scan was reviewed by Dr. Freida Busman and her cancer was deemed to be unresectable, this is confirmed by Dr. Kathrynn Ducking at [facility]     Assessment and Plan    Metastatic pancreatic cancer She is experiencing worsening symptoms, including peripheral edema and skin changes, likely related to chemotherapy and the cancer itself. She has been on chemotherapy for about four months, with today being the fifth cycle. Fatigue and chills are common side effects. Radiation therapy is considered as a local treatment to shrink the tumor and prevent obstruction, especially if she needs a break from chemotherapy due to fatigue. Radiation does not replace surgery and will not cure the cancer but may help manage symptoms. There is a high chance of cancer progression and spread.  She is frail and has experienced some bleeding, likely from hemorrhoids, and epistaxis, likely from chemotherapy. - Repeat CT scan in 2-3 weeks to assess cancer progression. - Refer to Dr. Mitzi Hansen for evaluation of radiation therapy options. - Continue chemotherapy as tolerated. - Discuss potential transition to radiation therapy if chemotherapy becomes intolerable. - Discuss with palliative care for pain management.  Ankle edema She reports swelling in the legs and ankles, which has not improved significantly with the use of furosemide over the past three days. The swelling is likely related to the cancer and treatment. - Continue furosemide for a total of five days and reassess. - Advise leg elevation when sitting to help reduce swelling. - Consider use of compression stockings if tolerated.  Pruritus and skin changes She reports itchy, dry skin with splotches, likely related to chemotherapy. She has been using lotion and some cream for relief. - Recommend hydrocortisone cream for itchiness. - Advise taking diphenhydramine at night and loratadine in the morning for itch relief.  Rectal bleeding She reports bright red rectal bleeding, which she attributes to hemorrhoids. This has occurred twice and is likely not related to the cancer. - Use hemorrhoid cream as needed for relief.  Epistaxis She experienced epistaxis, likely a side effect of chemotherapy. - Report if episodes become frequent or severe.  Goals of Care She expresses concerns about the pain and fatigue associated with treatment and questions the worth of continuing treatment given her prognosis. She believes in the possibility of miracles and is seeking guidance on treatment decisions. She is aware that radiation therapy is a local treatment and will not cure the cancer but may help manage symptoms. - Discuss treatment options and quality of life considerations with  her. - Coordinate with palliative care for support and  management of symptoms.  Plan -Lab reviewed, adequate for treatment, will proceed chemotherapy today and continue  She has an upcoming appointment with radiology for gallbladder drain evaluation and drainage on March 21st. -Follow-up in 2 weeks -refer to rad.onc Dr. Mitzi Hansen       SUMMARY OF ONCOLOGIC HISTORY: Oncology History  Pancreatic adenocarcinoma (HCC)  12/12/2022 Cancer Staging   Staging form: Exocrine Pancreas, AJCC 8th Edition - Clinical stage from 12/12/2022: Stage IIA (cT3, cN0, cM0) - Signed by Malachy Mood, MD on 12/28/2022 Total positive nodes: 0   12/16/2022 Initial Diagnosis   Pancreatic adenocarcinoma (HCC)   01/26/2023 -  Chemotherapy   Patient is on Treatment Plan : PANCREATIC Abraxane D1,8 + Gemcitabine D1,8 q21d      Genetic Testing   Negative 76 gene [facility] CancerNext-Expanded +RNAinsight panel. The CancerNext-Expanded gene panel offered by [facility] and includes sequencing, rearrangement, and RNA analysis for the following 76 genes: AIP, ALK, APC, ATM, AXIN2, BAP1, BARD1, BMPR1A, BRCA1, BRCA2, BRIP1, CDC73, CDH1, CDK4, CDKN1B, CDKN2A, CEBPA, CHEK2, CTNNA1, DDX41, DICER1, ETV6, FH, FLCN, GATA2, LZTR1, MAX, MBD4, MEN1, MET, MLH1, MSH2, MSH3, MSH6, MUTYH, NF1, NF2, NTHL1, PALB2, PHOX2B, PMS2, POT1, PRKAR1A, PTCH1, PTEN, RAD51C, RAD51D, RB1, RET, RUNX1, SDHA, SDHAF2, SDHB, SDHC, SDHD, SMAD4, SMARCA4, SMARCB1, SMARCE1, STK11, SUFU, TMEM127, TP53, TSC1, TSC2, VHL, and WT1 (sequencing and deletion/duplication); EGFR, HOXB13, KIT, MITF, PDGFRA, POLD1, and POLE (sequencing only); EPCAM and GREM1 (deletion/duplication only). Report date 03/19/23.    03/16/2023 Imaging   CT CAP with pancreatic protocol and CTA imaging - done [facility] Impression:  1. Local advanced infiltrative mass centered within the pancreatic head with extensive vascular involvement, as above.   2. A 7 mm left upper lobe pulmonary nodule is indeterminate.  3. Moderate biliary dilation with  obstruction of the level of the extra hepatic bile ducts and possibly owing to obstruction from the pancreatic head mass and/or inflammatory stricture.   4. Multiple pancreatic collections, largest anterior to the pancreatic body, possibly related to prior pancreatitis.  5. Ovoid nodule within the superior right breast is indeterminant. Consider further evaluation with dedicated mammogram as clinically appropriate for this patient.        Discussed the use of AI scribe software for clinical note transcription with the patient, who gave verbal consent to proceed.  History of Present Illness   The patient, a 63 year old female with metastatic pancreatic cancer, presents with worsening symptoms since her last visit. She reports increased swelling in her legs and the development of splotches on her skin. Despite wearing compression socks during the day, the swelling has not improved and the socks have caused her significant discomfort. The patient also reports dry, itchy skin, which she has been managing with lotion and a cream. She has been taking Lasix for the past three days to manage the swelling, but has not noticed any improvement yet. The patient also reports fatigue and chills, which she attributes to her ongoing chemotherapy treatment. She expresses frustration with her condition and the side effects of her treatment, and has had days where she questions if the pain is worth it.         All other systems were reviewed with the patient and are negative.  MEDICAL HISTORY:  Past Medical History:  Diagnosis Date   Alcohol abuse    Anxiety    Chickenpox    Depression    Neuromuscular disorder (HCC)    neuropathy  Pancreatic cancer (HCC)    Substance abuse [facility])     SURGICAL HISTORY: Past Surgical History:  Procedure Laterality Date   BIOPSY  12/10/2022   Procedure: BIOPSY;  Surgeon: Meridee Score Netty Starring., MD;  Location: [facility];  Service: Gastroenterology;;   CESAREAN  SECTION     DUODENAL STENT PLACEMENT N/A 01/06/2023   Procedure: DUODENAL STENT PLACEMENT;  Surgeon: Lemar Lofty., MD;  Location: [facility];  Service: Gastroenterology;  Laterality: N/A;   ESOPHAGOGASTRODUODENOSCOPY N/A 08/14/2021   Procedure: ESOPHAGOGASTRODUODENOSCOPY (EGD);  Surgeon: Rachael Fee, MD;  Location: [facility];  Service: Gastroenterology;  Laterality: N/A;   ESOPHAGOGASTRODUODENOSCOPY N/A 12/12/2022   Procedure: ESOPHAGOGASTRODUODENOSCOPY (EGD);  Surgeon: Lemar Lofty., MD;  Location: [facility];  Service: Gastroenterology;  Laterality: N/A;   ESOPHAGOGASTRODUODENOSCOPY (EGD) WITH PROPOFOL N/A 12/10/2022   Procedure: ESOPHAGOGASTRODUODENOSCOPY (EGD) WITH PROPOFOL;  Surgeon: Meridee Score Netty Starring., MD;  Location: [facility];  Service: Gastroenterology;  Laterality: N/A;   ESOPHAGOGASTRODUODENOSCOPY (EGD) WITH PROPOFOL N/A 01/06/2023   Procedure: ESOPHAGOGASTRODUODENOSCOPY (EGD) WITH PROPOFOL;  Surgeon: Meridee Score Netty Starring., MD;  Location: [facility];  Service: Gastroenterology;  Laterality: N/A;  with duodenal stent placement   EUS N/A 08/14/2021   Procedure: UPPER ENDOSCOPIC ULTRASOUND (EUS) RADIAL;  Surgeon: Rachael Fee, MD;  Location: [facility];  Service: Gastroenterology;  Laterality: N/A;   EUS N/A 12/12/2022   Procedure: UPPER ENDOSCOPIC ULTRASOUND (EUS) LINEAR;  Surgeon: Lemar Lofty., MD;  Location: [facility];  Service: Gastroenterology;  Laterality: N/A;   EUS N/A 01/06/2023   Procedure: FULL UPPER ENDOSCOPIC ULTRASOUND (EUS) RADIAL;  Surgeon: Meridee Score Netty Starring., MD;  Location: [facility];  Service: Gastroenterology;  Laterality: N/A;   FINE NEEDLE ASPIRATION N/A 08/14/2021   Procedure: FINE NEEDLE ASPIRATION (FNA) LINEAR;  Surgeon: Rachael Fee, MD;  Location: [facility];  Service: Gastroenterology;  Laterality: N/A;   FINE NEEDLE ASPIRATION N/A 12/12/2022   Procedure: FINE NEEDLE ASPIRATION (FNA)  LINEAR;  Surgeon: Lemar Lofty., MD;  Location: [facility];  Service: Gastroenterology;  Laterality: N/A;   FINE NEEDLE ASPIRATION  01/06/2023   Procedure: FINE NEEDLE ASPIRATION;  Surgeon: Meridee Score Netty Starring., MD;  Location: [facility];  Service: Gastroenterology;;   IR EXCHANGE BILIARY DRAIN  01/29/2023   IR EXCHANGE BILIARY DRAIN  03/05/2023   IR IMAGING GUIDED PORT INSERTION  12/18/2022   IR PERC CHOLECYSTOSTOMY  12/13/2022   TONSILLECTOMY      I have reviewed the social history and family history with the patient and they are unchanged from previous note.  ALLERGIES:  has no known allergies.  MEDICATIONS:  Current Outpatient Medications  Medication Sig Dispense Refill   acetaminophen (TYLENOL) 500 MG tablet Take 500-1,000 mg by mouth every 6 (six) hours as needed for moderate pain (pain score 4-6).     apixaban (ELIQUIS) 5 MG TABS tablet Take 1 tablet (5 mg total) by mouth 2 (two) times daily. 60 tablet 1   Ensure Max Protein (ENSURE MAX PROTEIN) LIQD Take 330 mLs (11 oz total) by mouth 2 (two) times daily.     furosemide (LASIX) 20 MG tablet Take 1 tablet (20 mg total) by mouth daily. For 5 days then as needed for leg edema 20 tablet 0   gabapentin (NEURONTIN) 300 MG capsule Take 2 capsules (600 mg total) by mouth 2 (two) times daily. 120 capsule 3   lidocaine-prilocaine (EMLA) cream Apply to affected area once 30 g 3   mirtazapine (REMERON) 45 MG tablet Take 1 tablet (45 mg  total) by mouth at bedtime. 30 tablet 3   ondansetron (ZOFRAN) 8 MG tablet Take 1 tablet (8 mg total) by mouth every 8 (eight) hours as needed for nausea or vomiting. 30 tablet 1   oxyCODONE 10 MG TABS Take 1 tablet (10 mg total) by mouth every 4 (four) hours as needed for severe pain (pain score 7-10). 90 tablet 0   potassium chloride (KLOR-CON M) 10 MEQ tablet Take 1 tablet (10 mEq total) by mouth daily. When you take lasix 20 tablet 0   prochlorperazine (COMPAZINE) 10 MG tablet Take 1 tablet (10  mg total) by mouth every 6 (six) hours as needed for nausea or vomiting. 30 tablet 1   QUEtiapine (SEROQUEL) 100 MG tablet Take 2 tablets (200 mg total) by mouth at bedtime. 30 tablet 3   sodium chloride flush (NS) 0.9 % SOLN Use 5 - 10 mLs to flush abdominal drain once daily as directed 300 mL 3   sucralfate (CARAFATE) 1 GM/10ML suspension TAKE 10 MLS (1 G TOTAL) BY MOUTH 4 (FOUR) TIMES DAILY - WITH MEALS AND AT BEDTIME. 420 mL 1   Vitamin D, Ergocalciferol, (DRISDOL) 1.25 MG (50000 UNIT) CAPS capsule Take 1 capsule (50,000 Units total) by mouth every 7 (seven) days. 5 capsule 6   pantoprazole (PROTONIX) 40 MG tablet Take 1 tablet (40 mg total) by mouth 2 (two) times daily. 180 tablet 3   No current facility-administered medications for this visit.   Facility-Administered Medications Ordered in Other Visits  Medication Dose Route Frequency Provider Last Rate Last Admin   0.9 %  sodium chloride infusion   Intravenous Continuous Malachy Mood, MD 10 mL/hr at 03/31/23 0948 New Bag at 03/31/23 0948   gemcitabine (GEMZAR) 1,406 mg in sodium chloride 0.9 % 250 mL chemo infusion  1,000 mg/m2 (Treatment Plan Recorded) Intravenous Once Malachy Mood, MD 574 mL/hr at 03/31/23 1123 1,406 mg at 03/31/23 1123    PHYSICAL EXAMINATION: ECOG PERFORMANCE STATUS: 2 - Symptomatic, <50% confined to bed  Vitals:   03/31/23 0909  BP: (!) 94/50  Pulse: (!) 101  Resp: 17  Temp: 97.6 F (36.4 C)  SpO2: 99%   Wt Readings from Last 3 Encounters:  03/31/23 112 lb 1.6 oz (50.8 kg)  03/24/23 109 lb (49.4 kg)  03/24/23 109 lb 12.8 oz (49.8 kg)     GENERAL:alert, no distress and comfortable SKIN: Ankle with swelling and splotches. Very dry skin with splotches all over the leg.  EYES: normal, Conjunctiva are pink and non-injected, sclera clear NECK: supple, thyroid normal size, non-tender, without nodularity LYMPH:  no palpable lymphadenopathy in the cervical, axillary  LUNGS: clear to auscultation and percussion with  normal breathing effort HEART: regular rate & rhythm and no murmurs and no lower extremity edema ABDOMEN:abdomen soft, non-tender and normal bowel sounds Musculoskeletal:no cyanosis of digits and no clubbing  NEURO: alert & oriented x 3 with fluent speech, no focal motor/sensory deficits   LABORATORY DATA:  I have reviewed the data as listed    Latest Ref Rng & Units 03/31/2023    8:44 AM 03/24/2023   10:14 AM 03/10/2023    8:35 AM  CBC  WBC 4.0 - 10.5 K/uL 8.3  7.2  8.4   Hemoglobin 12.0 - 15.0 g/dL 9.2  9.9  9.7   Hematocrit 36.0 - 46.0 % 28.7  31.2  30.9   Platelets 150 - 400 K/uL 532  930  503         Latest Ref  Rng & Units 03/31/2023    8:44 AM 03/24/2023   10:14 AM 03/10/2023    8:35 AM  CMP  Glucose 70 - 99 mg/dL 409  811  914   BUN 8 - 23 mg/dL 10  12  13    Creatinine 0.44 - 1.00 mg/dL 7.82  9.56  2.13   Sodium 135 - 145 mmol/L 134  136  138   Potassium 3.5 - 5.1 mmol/L 4.0  3.9  4.3   Chloride 98 - 111 mmol/L 100  103  101   CO2 22 - 32 mmol/L 29  29  33   Calcium 8.9 - 10.3 mg/dL 8.5  8.5  9.0   Total Protein 6.5 - 8.1 g/dL 5.5  5.4  6.0   Total Bilirubin 0.0 - 1.2 mg/dL 0.4  0.4  0.5   Alkaline Phos 38 - 126 U/L 340  354  359   AST 15 - 41 U/L 17  15  24    ALT 0 - 44 U/L 11  15  19        RADIOGRAPHIC STUDIES: I have personally reviewed the radiological images as listed and agreed with the findings in the report. No results found.    Orders Placed This Encounter  Procedures   CBC with Differential (Cancer Center Only)    Standing Status:   Future    Expected Date:   04/14/2023    Expiration Date:   04/13/2024   CMP (Cancer Center only)    Standing Status:   Future    Expected Date:   04/14/2023    Expiration Date:   04/13/2024   CBC with Differential (Cancer Center Only)    Standing Status:   Future    Expected Date:   04/21/2023    Expiration Date:   04/20/2024   CMP (Cancer Center only)    Standing Status:   Future    Expected Date:   04/21/2023     Expiration Date:   04/20/2024   Ambulatory referral to Radiation Oncology    Referral Priority:   Routine    Referral Type:   Consultation    Referral Reason:   Specialty Services Required    Requested Specialty:   Radiation Oncology    Number of Visits Requested:   1   All questions were answered. The patient knows to call the clinic with any problems, questions or concerns. No barriers to learning was detected. The total time spent in the appointment was 30 minutes.     Malachy Mood, MD 03/31/2023

## 2023-03-31 NOTE — Progress Notes (Signed)
 GI Location of Tumor / Histology: Pancreatic  Kathryn Underwood presented with complaints of recurrent abdominal pain and nausea.  Imaging noted pancreatitis and a large mass in the neck and head of pancreas with involvement of SMA and SMV.   Biopsies of Pancreatic Mass 12/12/2022   Past/Anticipated interventions by surgeon, if any:   Past/Anticipated interventions by medical oncology, if any:  Dr. Mosetta Putt 03/31/2023 -She has been on chemotherapy for about four months, with today being the fifth cycle.  - Refer to Dr. Mitzi Hansen for evaluation of radiation therapy options. - Continue chemotherapy as tolerated. - Discuss potential transition to radiation therapy if chemotherapy becomes intolerable.   Weight changes, if any:   Bowel/Bladder complaints, if any:   Nausea / Vomiting, if any:   Pain issues, if any:    Any blood per rectum:     SAFETY ISSUES: Prior radiation?  Pacemaker/ICD?  Possible current pregnancy? Postmenopausal Is the patient on methotrexate?   Current Complaints/Details:

## 2023-03-31 NOTE — Patient Instructions (Signed)
 [facility] - A DEPT OF [facility]  Discharge Instructions: Thank you for choosing [facility] to provide your oncology and hematology care.   If you have a lab appointment with the Cancer Center, please go directly to the Cancer Center and check in at the registration area.   Wear comfortable clothing and clothing appropriate for easy access to any Portacath or PICC line.   We strive to give you quality time with your provider. You may need to reschedule your appointment if you arrive late (15 or more minutes).  Arriving late affects you and other patients whose appointments are after yours.  Also, if you miss three or more appointments without notifying the office, you may be dismissed from the clinic at the provider's discretion.      For prescription refill requests, have your pharmacy contact our office and allow 72 hours for refills to be completed.    Today you received the following chemotherapy and/or immunotherapy agents: Abraxane/Gemzar      To help prevent nausea and vomiting after your treatment, we encourage you to take your nausea medication as directed.  BELOW ARE SYMPTOMS THAT SHOULD BE REPORTED IMMEDIATELY: *FEVER GREATER THAN 100.4 F (38 C) OR HIGHER *CHILLS OR SWEATING *NAUSEA AND VOMITING THAT IS NOT CONTROLLED WITH YOUR NAUSEA MEDICATION *UNUSUAL SHORTNESS OF BREATH *UNUSUAL BRUISING OR BLEEDING *URINARY PROBLEMS (pain or burning when urinating, or frequent urination) *BOWEL PROBLEMS (unusual diarrhea, constipation, pain near the anus) TENDERNESS IN MOUTH AND THROAT WITH OR WITHOUT PRESENCE OF ULCERS (sore throat, sores in mouth, or a toothache) UNUSUAL RASH, SWELLING OR PAIN  UNUSUAL VAGINAL DISCHARGE OR ITCHING   Items with * indicate a potential emergency and should be followed up as soon as possible or go to the Emergency Department if any problems should occur.  Please show the CHEMOTHERAPY ALERT CARD or  IMMUNOTHERAPY ALERT CARD at check-in to the Emergency Department and triage nurse.  Should you have questions after your visit or need to cancel or reschedule your appointment, please contact [facility] - A DEPT OF [facility]  Dept: [phone]  and follow the prompts.  Office hours are 8:00 a.m. to 4:30 p.m. Monday - Friday. Please note that voicemails left after 4:00 p.m. may not be returned until the following business day.  We are closed weekends and major holidays. You have access to a nurse at all times for urgent questions. Please call the main number to the clinic Dept: [phone] and follow the prompts.   For any non-urgent questions, you may also contact your provider using MyChart. We now offer e-Visits for anyone 49 and older to request care online for non-urgent symptoms. For details visit [email or web address].   Also download the MyChart app! Go to the app store, search "MyChart", open the app, select [place], and log in with your MyChart username and password.

## 2023-04-01 ENCOUNTER — Ambulatory Visit
Admission: RE | Admit: 2023-04-01 | Discharge: 2023-04-01 | Disposition: A | Discharge status: Home / Self Care | Source: Ambulatory Visit | Attending: Radiation Oncology | Admitting: Radiation Oncology

## 2023-04-01 ENCOUNTER — Encounter: Payer: Self-pay | Admitting: Radiation Oncology

## 2023-04-01 DIAGNOSIS — C258 Malignant neoplasm of overlapping sites of pancreas: Secondary | ICD-10-CM

## 2023-04-01 DIAGNOSIS — C259 Malignant neoplasm of pancreas, unspecified: Secondary | ICD-10-CM

## 2023-04-02 ENCOUNTER — Other Ambulatory Visit: Payer: Self-pay

## 2023-04-02 ENCOUNTER — Encounter: Payer: Self-pay | Admitting: Radiation Oncology

## 2023-04-02 ENCOUNTER — Inpatient Hospital Stay
Admission: RE | Admit: 2023-04-02 | Discharge: 2023-04-02 | Disposition: A | Discharge status: Home / Self Care | Payer: Self-pay | Source: Ambulatory Visit | Attending: Hematology | Admitting: Hematology

## 2023-04-02 DIAGNOSIS — C259 Malignant neoplasm of pancreas, unspecified: Secondary | ICD-10-CM

## 2023-04-02 NOTE — Progress Notes (Signed)
 Radiation Oncology         [phone] ________________________________  Initial Outpatient Consultation - Conducted via telephone at patient request.  I spoke with the patient to conduct this consult visit via telephone. The patient was notified in advance and was offered an in person or telemedicine meeting to allow for face to face communication but instead preferred to proceed with a telephone consult.    Name: Kathryn Underwood        MRN: [id number]  Date of Service: 04/01/2023 DOB: 01-Feb-1960  CC:Allwardt, Crist Infante, PA-C  Malachy Mood, MD     REFERRING PHYSICIAN: Malachy Mood, MD   DIAGNOSIS: The primary encounter diagnosis was Overlapping malignant neoplasm of pancreas [facility]). A diagnosis of Pancreatic adenocarcinoma (HCC) was also pertinent to this visit.   HISTORY OF PRESENT ILLNESS: Kathryn Underwood is a 63 y.o. female seen at the request of Dr. Mosetta Putt for a diagnosis of persistent somewhat progressive pancreatic carcinoma.  The patient originally presented with abdominal pain and nausea and was found to have a large mass in the head and neck of the pancreas involving the SMA and SMV by imagingIn November 2024.  She had also been followed in GI for a fluid collection abutting the pancreas in the summer 2023, but cytology at that time was benign.  Further workup by CT on 12/08/2022 the mass in the pancreatic head measured 6.4 cm and multiple fluid collections involving the pancreatic neck and body measured up to 6.9 cm and 4 cm.  By MRI on 12/09/2022, a large locally aggressive heterogeneous soft tissue mass in the center of the neck and head of the pancreas was identified involving the SMA, SMV, including the SMV and narrowing and SMA as well as extensive collateral vessels and portal vein thrombus, relatively poor visualization of the remainder of the pancreas was noted with changes consistent with neoplasm and possible pancreatitis, she had intrahepatic biliary ductal dilatation and focal  stricture in the distal common duct as well as dilation of the gallbladder with sludge and stones.  An indeterminant lesion in the dome of segment 8 of the liver was identified, she underwent endoscopy with EUS on 12/12/2022 and cytology from that specimen showed benign changes with in cystic structures that were sampled with FNA as well as adenocarcinoma in the FNA specimen of the pancreatic neck.  She also had a cystotomy tube placed in the gallbladder on 12/13/2022 and continues to have this exchanged at routine intervals by radiology.  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  She ultimately began Gemzar and Abraxane chemotherapy on 01/26/2023.  She has had evaluations with Dr. Freida Busman and Dr. Elbert Ewings at [facility] in the hepatobiliary programs, and she is not felt to be a good surgical candidate, she received a total of 4 cycles of her chemotherapy the most recent being on 03/31/2023.  Interval restaging imaging with a CT of the abdomen with pancreatic protocol at 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  She continued to have, multiple pancreatic fluid collections the largest being 5.8 cm anterior to the pancreatic body, and 3 cm  inferior to the pancreatic head, an ovoid nodule in the right breast, a 7 mm left upper lobe pulmonary nodule, and given these findings she again was not felt to be a good candidate for surgical resection.  She is contacted today to review options of radiotherapy.    PREVIOUS RADIATION THERAPY: No   PAST MEDICAL HISTORY:   Past Medical History:  Diagnosis Date   Alcohol abuse    Anxiety    Chickenpox    Depression    Neuromuscular disorder (HCC)    neuropathy   Pancreatic cancer (HCC)    Substance abuse (HCC)        PAST SURGICAL HISTORY: Past Surgical History:  Procedure Laterality Date   BIOPSY  12/10/2022   Procedure: BIOPSY;  Surgeon: Mansouraty, Netty Starring., MD;  Location: [facility];  Service: Gastroenterology;;   CESAREAN SECTION     DUODENAL STENT PLACEMENT N/A 01/06/2023   Procedure: DUODENAL STENT PLACEMENT;  Surgeon: Lemar Lofty., MD;  Location: [facility];  Service: Gastroenterology;  Laterality: N/A;   ESOPHAGOGASTRODUODENOSCOPY N/A 08/14/2021   Procedure: ESOPHAGOGASTRODUODENOSCOPY (EGD);  Surgeon: Rachael Fee, MD;  Location: [facility];  Service: Gastroenterology;  Laterality: N/A;   ESOPHAGOGASTRODUODENOSCOPY N/A 12/12/2022   Procedure: ESOPHAGOGASTRODUODENOSCOPY (EGD);  Surgeon: Lemar Lofty., MD;  Location: [facility];  Service: Gastroenterology;  Laterality: N/A;   ESOPHAGOGASTRODUODENOSCOPY (EGD) WITH PROPOFOL N/A 12/10/2022   Procedure: ESOPHAGOGASTRODUODENOSCOPY (EGD) WITH PROPOFOL;  Surgeon: Meridee Score Netty Starring., MD;  Location: [facility];  Service: Gastroenterology;  Laterality: N/A;   ESOPHAGOGASTRODUODENOSCOPY (EGD) WITH PROPOFOL N/A 01/06/2023   Procedure: ESOPHAGOGASTRODUODENOSCOPY (EGD) WITH PROPOFOL;  Surgeon: Meridee Score Netty Starring., MD;  Location: [facility];  Service: Gastroenterology;  Laterality: N/A;  with duodenal stent placement   EUS N/A 08/14/2021   Procedure: UPPER ENDOSCOPIC ULTRASOUND (EUS) RADIAL;  Surgeon: Rachael Fee, MD;  Location: [facility];  Service: Gastroenterology;  Laterality: N/A;   EUS N/A 12/12/2022   Procedure: UPPER ENDOSCOPIC ULTRASOUND (EUS) LINEAR;  Surgeon: Lemar Lofty., MD;  Location: [facility];  Service: Gastroenterology;  Laterality: N/A;   EUS N/A 01/06/2023   Procedure:  FULL UPPER ENDOSCOPIC ULTRASOUND (EUS) RADIAL;  Surgeon: Meridee Score Netty Starring., MD;  Location: [facility];  Service: Gastroenterology;  Laterality: N/A;   FINE NEEDLE ASPIRATION N/A 08/14/2021   Procedure: FINE NEEDLE ASPIRATION (FNA) LINEAR;  Surgeon: Rachael Fee, MD;  Location: [facility];  Service: Gastroenterology;  Laterality: N/A;   FINE NEEDLE ASPIRATION N/A 12/12/2022   Procedure: FINE NEEDLE ASPIRATION (FNA) LINEAR;  Surgeon: Lemar Lofty., MD;  Location: [facility];  Service: Gastroenterology;  Laterality: N/A;   FINE NEEDLE ASPIRATION  01/06/2023   Procedure: FINE NEEDLE ASPIRATION;  Surgeon: Meridee Score Netty Starring., MD;  Location: [facility];  Service: Gastroenterology;;   IR EXCHANGE BILIARY DRAIN  01/29/2023   IR EXCHANGE BILIARY DRAIN  03/05/2023   IR IMAGING GUIDED PORT INSERTION  12/18/2022   IR PERC CHOLECYSTOSTOMY  12/13/2022   TONSILLECTOMY       FAMILY HISTORY:  Family History  Problem Relation Age of Onset   Dementia Mother    Arthritis Father    Asthma Father    Asthma Brother    Diabetes Maternal Grandmother    Dementia Maternal Grandmother    Diabetes Paternal Grandmother    Parkinson's disease Maternal Grandfather    Parkinson's disease Paternal Grandfather    Breast cancer Neg Hx    Colon cancer Neg Hx      SOCIAL HISTORY:  reports  that she has been smoking cigarettes. She has never used smokeless tobacco. She reports that she does not currently use alcohol after a past usage of about 16.0 standard drinks of alcohol per week. She reports that she does not currently use drugs after having used the following drugs: Cocaine and Marijuana.   ALLERGIES: Patient has no known allergies.   MEDICATIONS:  Current Outpatient Medications  Medication Sig Dispense Refill   acetaminophen (TYLENOL) 500 MG tablet Take 500-1,000 mg by mouth every 6 (six) hours as needed for moderate pain (pain score 4-6).     apixaban (ELIQUIS) 5 MG TABS tablet  Take 1 tablet (5 mg total) by mouth 2 (two) times daily. 60 tablet 1   Ensure Max Protein (ENSURE MAX PROTEIN) LIQD Take 330 mLs (11 oz total) by mouth 2 (two) times daily.     furosemide (LASIX) 20 MG tablet Take 1 tablet (20 mg total) by mouth daily. For 5 days then as needed for leg edema 20 tablet 0   gabapentin (NEURONTIN) 300 MG capsule Take 2 capsules (600 mg total) by mouth 2 (two) times daily. 120 capsule 3   lidocaine-prilocaine (EMLA) cream Apply to affected area once 30 g 3   mirtazapine (REMERON) 45 MG tablet Take 1 tablet (45 mg total) by mouth at bedtime. 30 tablet 3   ondansetron (ZOFRAN) 8 MG tablet Take 1 tablet (8 mg total) by mouth every 8 (eight) hours as needed for nausea or vomiting. 30 tablet 1   oxyCODONE 10 MG TABS Take 1 tablet (10 mg total) by mouth every 4 (four) hours as needed for severe pain (pain score 7-10). 90 tablet 0   pantoprazole (PROTONIX) 40 MG tablet Take 1 tablet (40 mg total) by mouth 2 (two) times daily. 180 tablet 3   potassium chloride (KLOR-CON M) 10 MEQ tablet Take 1 tablet (10 mEq total) by mouth daily. When you take lasix 20 tablet 0   prochlorperazine (COMPAZINE) 10 MG tablet Take 1 tablet (10 mg total) by mouth every 6 (six) hours as needed for nausea or vomiting. 30 tablet 1   QUEtiapine (SEROQUEL) 100 MG tablet Take 2 tablets (200 mg total) by mouth at bedtime. 30 tablet 3   sodium chloride flush (NS) 0.9 % SOLN Use 5 - 10 mLs to flush abdominal drain once daily as directed 300 mL 3   sucralfate (CARAFATE) 1 GM/10ML suspension TAKE 10 MLS (1 G TOTAL) BY MOUTH 4 (FOUR) TIMES DAILY - WITH MEALS AND AT BEDTIME. 420 mL 1   Vitamin D, Ergocalciferol, (DRISDOL) 1.25 MG (50000 UNIT) CAPS capsule Take 1 capsule (50,000 Units total) by mouth every 7 (seven) days. 5 capsule 6   No current facility-administered medications for this encounter.     REVIEW OF SYSTEMS: On review of systems, the patient reports that she is doing fairly well, she has been  eating better in the last few weeks, but states that she remains quite tired and manages nausea with antiemetics.  She reports she gets extremely cold with chills the days of her chemotherapy which take about a day to resolve.  She continues to keep her cholecystotomy tube to straight drain.  No other complaints are verbalized.  PHYSICAL EXAM:  Unable to assess given encounter type.   ECOG = 1  0 - Asymptomatic (Fully active, able to carry on all predisease activities without restriction)  1 - Symptomatic but completely ambulatory (Restricted in physically strenuous activity but ambulatory and able to carry out work  of a light or sedentary nature. For example, light housework, office work)  2 - Symptomatic, <50% in bed during the day (Ambulatory and capable of all self care but unable to carry out any work activities. Up and about more than 50% of waking hours)  3 - Symptomatic, >50% in bed, but not bedbound (Capable of only limited self-care, confined to bed or chair 50% or more of waking hours)  4 - Bedbound (Completely disabled. Cannot carry on any self-care. Totally confined to bed or chair)  5 - Death   Santiago Glad MM, Creech RH, Tormey DC, et al. [phone]). "Toxicity and response criteria of the [facility]". Am. Evlyn Clines. Oncol. 5 (6): 649-55    LABORATORY DATA:  Lab Results  Component Value Date   WBC 8.3 03/31/2023   HGB 9.2 (L) 03/31/2023   HCT 28.7 (L) 03/31/2023   MCV 91.1 03/31/2023   PLT 532 (H) 03/31/2023   Lab Results  Component Value Date   NA 134 (L) 03/31/2023   K 4.0 03/31/2023   CL 100 03/31/2023   CO2 29 03/31/2023   Lab Results  Component Value Date   ALT 11 03/31/2023   AST 17 03/31/2023   ALKPHOS 340 (H) 03/31/2023   BILITOT 0.4 03/31/2023      RADIOGRAPHY: IR EXCHANGE BILIARY DRAIN Result Date: 03/05/2023 INDICATION: Pancreatic carcinoma with biliary obstruction, status post percutaneous cholecystostomy catheter placement, has  been working well, presents for scheduled routine exchange. Patient is interested in possible catheter capping to allow internal drainage EXAM: CHOLECYSTOSTOMY CATHETER EXCHANGE UNDER FLUOROSCOPY PERCUTANEOUS CHOLANGIOGRAM THROUGH EXISTING CATHETER MEDICATIONS: No periprocedural antibiotics were indicated ANESTHESIA/SEDATION: Lidocaine 1% subcutaneous FLUOROSCOPY: Radiation Exposure Index (as provided by the fluoroscopic device): 5 mGy Kerma COMPLICATIONS: None immediate. PROCEDURE: Informed written consent was obtained from the patient after a thorough discussion of the procedural risks, benefits and alternatives. All questions were addressed. Maximal Sterile Barrier Technique was utilized including caps, mask, sterile gowns, sterile gloves, sterile drape, hand hygiene and skin antiseptic. A timeout was performed prior to the initiation of the procedure. The previously placed catheter and surrounding skin prepped and draped in usual sterile fashion. Site infiltrated locally with 1% lidocaine. Iodinated contrast injected through the catheter to the gallbladder is moderately distended. There is no passage of contrast beyond cystic duct. The catheter was cut and exchanged over an Amplatz wire for a new 10.2 French pigtail drain catheter, formed centrally within the gallbladder. Small contrast injection confirms good positioning into the decompressed gallbladder lumen. Catheter secured externally 0 Prolene suture and StatLock and placed to gravity drain bag. The patient tolerated the procedure well. IMPRESSION: 1. Technically successful exchange of cholecystostomy catheter under fluoroscopy. 2. Cholangiogram demonstrated no flow beyond the cystic duct. Electronically Signed   By: Corlis Leak M.D.   On: 03/05/2023 17:09   DG Chest 2 View Result Date: 03/03/2023 CLINICAL DATA:  [facility], pancreatic cancer EXAM: CHEST - 2 VIEW COMPARISON:  01/03/2023 FINDINGS: Normal cardiac silhouette. No effusion, infiltrate  pneumothorax. Lungs are hyperinflated. Port in the anterior chest wall with tip in distal SVC. No acute osseous abnormality. IMPRESSION: Hyperinflated lungs.  No acute findings. Electronically Signed   By: Genevive Bi M.D.   On: 03/03/2023 14:36       IMPRESSION/PLAN: 1. Locally advanced pancreatic adenocarcinoma involving the head and neck.  Dr. Mitzi Hansen discusses the patient's course to date, we are trying to obtain her imaging from her scan at [facility] on our PACS system so he  can see the images from that procedure.  Her case has been discussed between her medical and radiotherapy oncology team, she would be a good candidate for conformal radiation with chemoradiation.  Dr. Mitzi Hansen would recommend Xeloda sensitization.  We will reach out to Dr. Juliette Alcide regarding the timing of when she would like to proceed with this regimen, or if she would like to administer additional chemotherapy with Gemzar/Abraxane prior to chemoradiation.  The patient is interested in proceeding after we reviewed the delivery and logistics as well as short and long-term effects of radiotherapy.  We will touch base with the patient after further clarity about the status of her systemic chemotherapy is clear after discussing with medical oncology.  She is in agreement, she would need simulation to be scheduled and consent in order to proceed.  We discussed the possibility of starting treatment on 04/21/2023 if Dr. Mosetta Putt does not want to give additional Gemzar/Abraxane.  This encounter was conducted via telephone.  The patient has provided two factor identification and has given verbal consent for this type of encounter and has been advised to only accept a meeting of this type in a secure network environment. The time spent during this encounter was 60 minutes including preparation, discussion, and coordination of the patient's care. The attendants for this meeting include Rober Minion, RN, Dr. Mitzi Hansen, Ronny Bacon  and Virgel Gess.  During the encounter,  Rober Minion, RN, Dr. Mitzi Hansen, and Ronny Bacon were located at [facility] Radiation Oncology Department.  Virgel Gess was located at home.   The above documentation reflects my direct findings during this shared patient visit. Please see the separate note by Dr. Mitzi Hansen on this date for the remainder of the patient's plan of care.    Osker Mason, [facility]   **Disclaimer: This note was dictated with voice recognition software. Similar sounding words can inadvertently be transcribed and this note may contain transcription errors which may not have been corrected upon publication of note.**

## 2023-04-02 NOTE — Progress Notes (Signed)
 Faxed over a request as per Dr. Mosetta Putt to [facility] to get the patients last CT scan pushed over to [place] in [place]. F [phone] P [phone]. Received confirmation of fax receipt.

## 2023-04-03 ENCOUNTER — Telehealth: Payer: Self-pay | Admitting: Hematology

## 2023-04-03 NOTE — Telephone Encounter (Signed)
 Per staff message sent on 04/02/2023; left patient a voicemail in regards to changed in appointment times/dates; left patient callback number if needing to reschedule or change appointment times/dates

## 2023-04-05 ENCOUNTER — Telehealth: Payer: Self-pay

## 2023-04-05 NOTE — Telephone Encounter (Signed)
 Pt called and LVM regarding questions about her leg swelling. Pt did not leave any details.RN attempted to call back, no answer, LVM and callback number.

## 2023-04-08 ENCOUNTER — Inpatient Hospital Stay: Admitting: Hematology

## 2023-04-08 NOTE — Assessment & Plan Note (Addendum)
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 -She was readmitted to hospital for recurrent nausea and vomiting, likely partial gastric outlet obstruction from pancreatic tumor.  She is able to tolerate full liquid diet, she underwet duodenal stent placement on 01/06/2023 -she started chemo gemcitabine and abraxane on 01/26/2023  -her scan was reviewed by Dr. Freida Busman and her cancer was deemed to be unresectable, this is confirmed by Dr. Kathrynn Ducking at [facility] to start concurrent chemoradiation with Xeloda on April 26, 2023, after one more dose chemo on 3/19.

## 2023-04-09 ENCOUNTER — Telehealth: Payer: Self-pay

## 2023-04-09 ENCOUNTER — Inpatient Hospital Stay
Admission: RE | Admit: 2023-04-09 | Discharge: 2023-04-09 | Disposition: A | Discharge status: Home / Self Care | Payer: Self-pay | Source: Ambulatory Visit | Attending: Radiation Oncology | Admitting: Radiation Oncology

## 2023-04-09 ENCOUNTER — Other Ambulatory Visit: Payer: Self-pay | Admitting: Radiation Oncology

## 2023-04-09 ENCOUNTER — Telehealth: Payer: Self-pay | Admitting: Hematology

## 2023-04-09 ENCOUNTER — Other Ambulatory Visit: Payer: Self-pay | Admitting: Nurse Practitioner

## 2023-04-09 ENCOUNTER — Other Ambulatory Visit: Payer: Self-pay

## 2023-04-09 DIAGNOSIS — C259 Malignant neoplasm of pancreas, unspecified: Secondary | ICD-10-CM

## 2023-04-09 DIAGNOSIS — G893 Neoplasm related pain (acute) (chronic): Secondary | ICD-10-CM

## 2023-04-09 MED ORDER — OXYCODONE HCL 10 MG PO TABS
10.0000 mg | ORAL_TABLET | ORAL | 0 refills | Status: DC | PRN
Start: 2023-04-09 — End: 2023-04-21

## 2023-04-09 NOTE — Telephone Encounter (Signed)
 Patient called for medication refill, see associated orders.

## 2023-04-09 NOTE — Telephone Encounter (Signed)
 Pt called stating she's completely our of pain medication and is requesting a refill on her Oxycodone.  Pt stated she called her pharmacy and was told that the Oxycodone could not be refilled until Wednesday, 04/14/2023.  Informed pt that Dr. Mosetta Putt is out of the office along with Mayra Reel, NP but stated this nurse will notify Dr. Mosetta Putt, Jake Samples, and Vincent Gros, NP of the pt's request.  Pt verbalized understanding.

## 2023-04-09 NOTE — Telephone Encounter (Signed)
 Patient is aware of rescheduled appointment times/dates; patient has callback number for scheduling if needing to cancel or reschedule appointments

## 2023-04-12 NOTE — Assessment & Plan Note (Signed)
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 -She was readmitted to hospital for recurrent nausea and vomiting, likely partial gastric outlet obstruction from pancreatic tumor.  She is able to tolerate full liquid diet, she underwet duodenal stent placement on 01/06/2023 -she started chemo gemcitabine and abraxane on 01/26/2023  -her scan was reviewed by Dr. Freida Busman and her cancer was deemed to be unresectable, this is confirmed by Dr. Kathrynn Ducking at [facility] to start concurrent chemoradiation with Xeloda on April 26, 2023, after one more dose chemo on 3/19.

## 2023-04-13 ENCOUNTER — Other Ambulatory Visit (HOSPITAL_COMMUNITY): Payer: Self-pay

## 2023-04-13 ENCOUNTER — Encounter: Payer: Self-pay | Admitting: Hematology

## 2023-04-13 ENCOUNTER — Telehealth: Payer: Self-pay | Admitting: Pharmacy Technician

## 2023-04-13 ENCOUNTER — Telehealth: Payer: Self-pay | Admitting: Pharmacist

## 2023-04-13 ENCOUNTER — Inpatient Hospital Stay (HOSPITAL_BASED_OUTPATIENT_CLINIC_OR_DEPARTMENT_OTHER): Admitting: Hematology

## 2023-04-13 DIAGNOSIS — C259 Malignant neoplasm of pancreas, unspecified: Secondary | ICD-10-CM

## 2023-04-13 MED ORDER — CAPECITABINE 500 MG PO TABS: ORAL_TABLET | ORAL | 1 refills | Status: DC

## 2023-04-13 NOTE — Telephone Encounter (Signed)
 Oral Oncology Patient Advocate Encounter  After completing a benefits investigation, prior authorization for Capecitabine is not required at this time through [facility].  Patient's copay is $4.     Sherilyn Dacosta, CPhT Specialty Pharmacy Patient Advocate Phone: [phone] Fax: [phone]

## 2023-04-13 NOTE — Progress Notes (Signed)
 [facility]   [phone]) [phone] [phone]   Clinic Follow up Note   Patient Care Team: Allwardt, Crist Infante, PA-C as PCP - General (Physician Assistant) Geraldine Contras, MD as Referring Physician (Psychiatry) Malachy Mood, MD as Consulting Physician (Hematology and Oncology) Pickenpack-Cousar, Arty Baumgartner, NP as Nurse Practitioner [facility] and Palliative Medicine) 04/13/2023  I connected with Kathryn Underwood on 04/13/23 at  2:20 PM EDT by telephone and verified that I am speaking with the correct person using two identifiers.   I discussed the limitations, risks, security and privacy concerns of performing an evaluation and management service by telephone and the availability of in person appointments. I also discussed with the patient that there may be a patient responsible charge related to this service. The patient expressed understanding and agreed to proceed.   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 -She was readmitted to hospital for recurrent nausea and vomiting, likely partial gastric outlet obstruction from pancreatic tumor.  She is able to tolerate full liquid diet, she underwet duodenal stent placement on 01/06/2023 -she started chemo gemcitabine and abraxane on 01/26/2023  -her scan was reviewed by Dr. Freida Busman and her cancer was deemed to be unresectable, this is confirmed by Dr. Kathrynn Ducking at [facility] to start concurrent chemoradiation with Xeloda on April 26, 2023   Assessment and Plan     Pancreatic cancer She has pancreatic cancer with increased, constant, and severe pain, necessitating the use of a walker. She is scheduled to start concurrent chemo-radiation therapy. Oral chemotherapy with Xeloda is recommended alongside radiation therapy. Potential side effects include fatigue, skin irritation on hands and feet, mild nausea, diarrhea, and appetite issues. Oral chemotherapy is expected to be less burdensome than intravenous chemotherapy. Topical creams like Udderly Smooth and Voltaren gel can be used for skin irritation and joint pain. Pain management with oxycodone is in place, with follow-up for pain medication management. - Initiate oral chemotherapy with Xeloda in conjunction with radiation therapy on March 31st. - Discuss potential side effects of oral chemotherapy and radiation, including fatigue, skin irritation on hands and feet, mild nausea, diarrhea, and appetite issues. - Apply topical creams like Udderly Smooth and Voltaren gel for skin irritation and joint pain. - Ensure adequate pain management with oxycodone, with follow-up for pain medication management.  Diarrhea She has sudden onset diarrhea with loose, pudding-like stools since yesterday, without fever, and is able to eat and drink. The diarrhea is likely acute and may be related to dietary changes or other factors. Imodium is recommended to manage symptoms, and she should maintain hydration with fluids like Gatorade and Poteligeo. - Administer Imodium, 1-2 tablets every 4 hours, up to 7-8 tablets a day. - Maintain hydration with fluids, including Gatorade and Poteligeo. - Monitor symptoms and contact the clinic if diarrhea worsens or persists.  Plan -I called in Xeloda for her today, she will start on first day of radiation on April 26, 2023 -She will use Imodium as needed, up to 8 tablets a day, for her diarrhea.  She knows to call us if her diarrhea does  not improve later this week. -Follow-up on March 31         SUMMARY OF ONCOLOGIC HISTORY: Oncology History  Pancreatic adenocarcinoma (HCC)  12/12/2022 Cancer Staging   Staging form: Exocrine Pancreas, AJCC 8th Edition - Clinical stage from 12/12/2022: Stage IIA (cT3, cN0, cM0) - Signed by Malachy Mood, MD on 12/28/2022 Total positive nodes: 0   12/16/2022 Initial Diagnosis   Pancreatic adenocarcinoma (HCC)   01/26/2023 -  Chemotherapy   Patient is on Treatment Plan : PANCREATIC Abraxane D1,8 + Gemcitabine D1,8 q21d      Genetic Testing   Negative 76 gene [facility] CancerNext-Expanded +RNAinsight panel. The CancerNext-Expanded gene panel offered by [facility] and includes sequencing, rearrangement, and RNA analysis for the following 76 genes: AIP, ALK, APC, ATM, AXIN2, BAP1, BARD1, BMPR1A, BRCA1, BRCA2, BRIP1, CDC73, CDH1, CDK4, CDKN1B, CDKN2A, CEBPA, CHEK2, CTNNA1, DDX41, DICER1, ETV6, FH, FLCN, GATA2, LZTR1, MAX, MBD4, MEN1, MET, MLH1, MSH2, MSH3, MSH6, MUTYH, NF1, NF2, NTHL1, PALB2, PHOX2B, PMS2, POT1, PRKAR1A, PTCH1, PTEN, RAD51C, RAD51D, RB1, RET, RUNX1, SDHA, SDHAF2, SDHB, SDHC, SDHD, SMAD4, SMARCA4, SMARCB1, SMARCE1, STK11, SUFU, TMEM127, TP53, TSC1, TSC2, VHL, and WT1 (sequencing and deletion/duplication); EGFR, HOXB13, KIT, MITF, PDGFRA, POLD1, and POLE (sequencing only); EPCAM and GREM1 (deletion/duplication only). Report date 03/19/23.    03/16/2023 Imaging   CT CAP with pancreatic protocol and CTA imaging - done [facility] Impression:  1. Local advanced infiltrative mass centered within the pancreatic head with extensive vascular involvement, as above.   2. A 7 mm left upper lobe pulmonary nodule is indeterminate.  3. Moderate biliary dilation with obstruction of the level of the extra hepatic bile ducts and possibly owing to obstruction from the pancreatic head mass and/or inflammatory stricture.   4. Multiple pancreatic collections, largest anterior to the pancreatic body, possibly related to prior pancreatitis.  5.  Ovoid nodule within the superior right breast is indeterminant. Consider further evaluation with dedicated mammogram as clinically appropriate for this patient.       Discussed the use of AI scribe software for clinical note transcription with the patient, who gave verbal consent to proceed.  History of Present Illness   Kathryn Underwood, a patient with pancreatic cancer, reports a sudden onset of loose bowel movements, described as "like pudding," which started the day prior to the conversation. She experienced an urgency to defecate, leading to several instances of incontinence. She denies fever and continues to eat and drink normally. She also reports increased abdominal girth, although it is unclear if this is related to her bowel changes or a separate issue.  In addition to her gastrointestinal symptoms, Kathryn Underwood reports worsening back pain. She describes needing to brace herself to stand up and has been using a walker for support. She is unsure why her pain has suddenly worsened. She is currently managing her pain with medication, but notes that the relief is not as effective as before.  Kathryn Underwood also mentions swelling in her feet, which has mostly resolved. She has been managing this with compression socks and elevation, and has stopped taking Lasix due to its diuretic effects causing urgency to urinate.         REVIEW OF SYSTEMS:   Constitutional: Denies fevers, chills or abnormal weight loss Eyes: Denies blurriness of vision Ears, nose, mouth, throat, and face: Denies mucositis or sore throat Respiratory: Denies cough, dyspnea or wheezes Cardiovascular: Denies palpitation, chest discomfort or lower extremity swelling Gastrointestinal:  Denies nausea, heartburn or change in bowel habits Skin: Denies abnormal  skin rashes Lymphatics: Denies new lymphadenopathy or easy bruising Neurological:Denies numbness, tingling or new weaknesses Behavioral/Psych: Mood is stable, no new changes  All other  systems were reviewed with the patient and are negative.  MEDICAL HISTORY:  Past Medical History:  Diagnosis Date   Alcohol abuse    Anxiety    Chickenpox    Depression    Neuromuscular disorder (HCC)    neuropathy   Pancreatic cancer (HCC)    Substance abuse (HCC)     SURGICAL HISTORY: Past Surgical History:  Procedure Laterality Date   BIOPSY  12/10/2022   Procedure: BIOPSY;  Surgeon: Mansouraty, Netty Starring., MD;  Location: [facility];  Service: Gastroenterology;;   CESAREAN SECTION     DUODENAL STENT PLACEMENT N/A 01/06/2023   Procedure: DUODENAL STENT PLACEMENT;  Surgeon: Lemar Lofty., MD;  Location: [facility];  Service: Gastroenterology;  Laterality: N/A;   ESOPHAGOGASTRODUODENOSCOPY N/A 08/14/2021   Procedure: ESOPHAGOGASTRODUODENOSCOPY (EGD);  Surgeon: Rachael Fee, MD;  Location: [facility];  Service: Gastroenterology;  Laterality: N/A;   ESOPHAGOGASTRODUODENOSCOPY N/A 12/12/2022   Procedure: ESOPHAGOGASTRODUODENOSCOPY (EGD);  Surgeon: Lemar Lofty., MD;  Location: [facility];  Service: Gastroenterology;  Laterality: N/A;   ESOPHAGOGASTRODUODENOSCOPY (EGD) WITH PROPOFOL N/A 12/10/2022   Procedure: ESOPHAGOGASTRODUODENOSCOPY (EGD) WITH PROPOFOL;  Surgeon: Meridee Score Netty Starring., MD;  Location: [facility];  Service: Gastroenterology;  Laterality: N/A;   ESOPHAGOGASTRODUODENOSCOPY (EGD) WITH PROPOFOL N/A 01/06/2023   Procedure: ESOPHAGOGASTRODUODENOSCOPY (EGD) WITH PROPOFOL;  Surgeon: Meridee Score Netty Starring., MD;  Location: [facility];  Service: Gastroenterology;  Laterality: N/A;  with duodenal stent placement   EUS N/A 08/14/2021   Procedure: UPPER ENDOSCOPIC ULTRASOUND (EUS) RADIAL;  Surgeon: Rachael Fee, MD;  Location: [facility];  Service: Gastroenterology;  Laterality: N/A;   EUS N/A 12/12/2022   Procedure: UPPER ENDOSCOPIC ULTRASOUND (EUS) LINEAR;  Surgeon: Lemar Lofty., MD;  Location: [facility];  Service:  Gastroenterology;  Laterality: N/A;   EUS N/A 01/06/2023   Procedure: FULL UPPER ENDOSCOPIC ULTRASOUND (EUS) RADIAL;  Surgeon: Meridee Score Netty Starring., MD;  Location: [facility];  Service: Gastroenterology;  Laterality: N/A;   FINE NEEDLE ASPIRATION N/A 08/14/2021   Procedure: FINE NEEDLE ASPIRATION (FNA) LINEAR;  Surgeon: Rachael Fee, MD;  Location: [facility];  Service: Gastroenterology;  Laterality: N/A;   FINE NEEDLE ASPIRATION N/A 12/12/2022   Procedure: FINE NEEDLE ASPIRATION (FNA) LINEAR;  Surgeon: Lemar Lofty., MD;  Location: [facility];  Service: Gastroenterology;  Laterality: N/A;   FINE NEEDLE ASPIRATION  01/06/2023   Procedure: FINE NEEDLE ASPIRATION;  Surgeon: Meridee Score Netty Starring., MD;  Location: [facility];  Service: Gastroenterology;;   IR EXCHANGE BILIARY DRAIN  01/29/2023   IR EXCHANGE BILIARY DRAIN  03/05/2023   IR IMAGING GUIDED PORT INSERTION  12/18/2022   IR PERC CHOLECYSTOSTOMY  12/13/2022   TONSILLECTOMY      I have reviewed the social history and family history with the patient and they are unchanged from previous note.  ALLERGIES:  has no known allergies.  MEDICATIONS:  Current Outpatient Medications  Medication Sig Dispense Refill   capecitabine (XELODA) 500 MG tablet Take 2 tabs in morning and 3 tabs in evening, every 10-12 hours. Take on days of radiation, Monday through Fridays. 75 tablet 1   acetaminophen (TYLENOL) 500 MG tablet Take 500-1,000 mg by mouth every 6 (six) hours as needed for moderate pain (pain score 4-6).     ELIQUIS 5 MG TABS tablet TAKE 1 TABLET BY MOUTH TWICE A DAY 60 tablet 1  Ensure Max Protein (ENSURE MAX PROTEIN) LIQD Take 330 mLs (11 oz total) by mouth 2 (two) times daily.     furosemide (LASIX) 20 MG tablet Take 1 tablet (20 mg total) by mouth daily. For 5 days then as needed for leg edema 20 tablet 0   gabapentin (NEURONTIN) 300 MG capsule Take 2 capsules (600 mg total) by mouth 2 (two) times daily. 120 capsule  3   lidocaine-prilocaine (EMLA) cream Apply to affected area once 30 g 3   mirtazapine (REMERON) 45 MG tablet Take 1 tablet (45 mg total) by mouth at bedtime. 30 tablet 3   ondansetron (ZOFRAN) 8 MG tablet Take 1 tablet (8 mg total) by mouth every 8 (eight) hours as needed for nausea or vomiting. 30 tablet 1   Oxycodone HCl 10 MG TABS Take 1 tablet (10 mg total) by mouth every 4 (four) hours as needed. 90 tablet 0   pantoprazole (PROTONIX) 40 MG tablet Take 1 tablet (40 mg total) by mouth 2 (two) times daily. 180 tablet 3   potassium chloride (KLOR-CON M) 10 MEQ tablet Take 1 tablet (10 mEq total) by mouth daily. When you take lasix 20 tablet 0   prochlorperazine (COMPAZINE) 10 MG tablet Take 1 tablet (10 mg total) by mouth every 6 (six) hours as needed for nausea or vomiting. 30 tablet 1   QUEtiapine (SEROQUEL) 100 MG tablet Take 2 tablets (200 mg total) by mouth at bedtime. 30 tablet 3   sodium chloride flush (NS) 0.9 % SOLN Use 5 - 10 mLs to flush abdominal drain once daily as directed 300 mL 3   sucralfate (CARAFATE) 1 GM/10ML suspension TAKE 10 MLS (1 G TOTAL) BY MOUTH 4 (FOUR) TIMES DAILY - WITH MEALS AND AT BEDTIME. 420 mL 1   Vitamin D, Ergocalciferol, (DRISDOL) 1.25 MG (50000 UNIT) CAPS capsule Take 1 capsule (50,000 Units total) by mouth every 7 (seven) days. 5 capsule 6   No current facility-administered medications for this visit.    PHYSICAL EXAMINATION: Not performed   LABORATORY DATA:  I have reviewed the data as listed    Latest Ref Rng & Units 03/31/2023    8:44 AM 03/24/2023   10:14 AM 03/10/2023    8:35 AM  CBC  WBC 4.0 - 10.5 K/uL 8.3  7.2  8.4   Hemoglobin 12.0 - 15.0 g/dL 9.2  9.9  9.7   Hematocrit 36.0 - 46.0 % 28.7  31.2  30.9   Platelets 150 - 400 K/uL 532  930  503         Latest Ref Rng & Units 03/31/2023    8:44 AM 03/24/2023   10:14 AM 03/10/2023    8:35 AM  CMP  Glucose 70 - 99 mg/dL 315  176  160   BUN 8 - 23 mg/dL 10  12  13    Creatinine 0.44 - 1.00  mg/dL 7.37  1.06  2.69   Sodium 135 - 145 mmol/L 134  136  138   Potassium 3.5 - 5.1 mmol/L 4.0  3.9  4.3   Chloride 98 - 111 mmol/L 100  103  101   CO2 22 - 32 mmol/L 29  29  33   Calcium 8.9 - 10.3 mg/dL 8.5  8.5  9.0   Total Protein 6.5 - 8.1 g/dL 5.5  5.4  6.0   Total Bilirubin 0.0 - 1.2 mg/dL 0.4  0.4  0.5   Alkaline Phos 38 - 126 U/L 340  354  359   AST 15 - 41 U/L 17  15  24    ALT 0 - 44 U/L 11  15  19        RADIOGRAPHIC STUDIES: I have personally reviewed the radiological images as listed and agreed with the findings in the report. No results found.     I discussed the assessment and treatment plan with the patient. The patient was provided an opportunity to ask questions and all were answered. The patient agreed with the plan and demonstrated an understanding of the instructions.   The patient was advised to call back or seek an in-person evaluation if the symptoms worsen or if the condition fails to improve as anticipated.  I provided 25 minutes of non face-to-face telephone visit time during this encounter, and > 50% was spent counseling as documented under my assessment & plan.     Malachy Mood, MD 04/13/23

## 2023-04-14 ENCOUNTER — Ambulatory Visit
Admission: RE | Admit: 2023-04-14 | Discharge: 2023-04-14 | Disposition: A | Discharge status: Home / Self Care | Source: Ambulatory Visit | Attending: Radiation Oncology

## 2023-04-14 ENCOUNTER — Ambulatory Visit
Admission: RE | Admit: 2023-04-14 | Discharge: 2023-04-14 | Disposition: A | Discharge status: Home / Self Care | Source: Ambulatory Visit | Attending: Radiation Oncology | Admitting: Radiation Oncology

## 2023-04-14 ENCOUNTER — Ambulatory Visit

## 2023-04-14 ENCOUNTER — Other Ambulatory Visit: Payer: Self-pay

## 2023-04-14 ENCOUNTER — Other Ambulatory Visit

## 2023-04-14 ENCOUNTER — Ambulatory Visit: Admitting: Hematology

## 2023-04-14 VITALS — BP 106/67 | HR 95 | Temp 97.8°F | Resp 18 | Ht 63.0 in | Wt 111.1 lb

## 2023-04-14 DIAGNOSIS — Z510 Encounter for antineoplastic radiation therapy: Secondary | ICD-10-CM | POA: Diagnosis present

## 2023-04-14 DIAGNOSIS — Z95828 Presence of other vascular implants and grafts: Secondary | ICD-10-CM

## 2023-04-14 DIAGNOSIS — C257 Malignant neoplasm of other parts of pancreas: Secondary | ICD-10-CM | POA: Diagnosis not present

## 2023-04-14 DIAGNOSIS — K8689 Other specified diseases of pancreas: Secondary | ICD-10-CM

## 2023-04-14 DIAGNOSIS — C259 Malignant neoplasm of pancreas, unspecified: Secondary | ICD-10-CM

## 2023-04-14 MED ORDER — SODIUM CHLORIDE 0.9% FLUSH
10.0000 mL | Freq: Once | INTRAVENOUS | Status: AC
Start: 2023-04-14 — End: 2023-04-14
  Administered 2023-04-14: 10 mL via INTRAVENOUS

## 2023-04-14 MED ORDER — HEPARIN SOD (PORK) LOCK FLUSH 100 UNIT/ML IV SOLN
500.0000 [IU] | Freq: Once | INTRAVENOUS | Status: AC
  Administered 2023-04-14: 500 [IU] via INTRAVENOUS

## 2023-04-14 MED ORDER — CAPECITABINE 500 MG PO TABS
ORAL_TABLET | ORAL | 1 refills | Status: DC
  Filled 2023-04-16: qty 75, 21d supply, fill #0

## 2023-04-14 NOTE — Telephone Encounter (Signed)
 Oral Oncology Pharmacist Encounter  Received new prescription for Xeloda (capecitabine) for the treatment of pancreatic cancer in conjunction with radiation, planned duration 5 1/2 - 6 weeks.  CBC w/ Diff and CMP from 03/31/23 assessed, no relevant lab abnormalities requiring baseline dose adjustment required at this time. Prescription dose and frequency assessed for appropriateness.  Current medication list in Epic reviewed, DDIs with Xeloda identified: Category D drug-drug interaction between Xeloda and Seroquel due to risk of Qtc prolongation with concomitant use of agents. Will see if MD would like baseline EKG prior to starting Xeloda (noted last EKG in chart from 01/16/23 and QTcB at that time was .) Category C DDI between Xeloda and Pantoprazole - proton-pump inhibitors can decrease efficacy of Xeloda - will discuss with patient alternatives to pantoprazole, such as H2RA's like famotidine while on Xeloda. Category C DDI between Xeloda and Ondansetron due to risk of Qtc prolongation with fluorouracil products. Noted patient only taking PRN and PO route, risk higher with IV administration. No change in therapy warranted at this time.   Evaluated chart and no patient barriers to medication adherence noted.   Prescription has been e-scribed to the [facility] for benefits analysis and approval.  Oral Oncology Clinic will continue to follow for insurance authorization, copayment issues, initial counseling and start date.  Sherry Ruffing, PharmD, BCPS, BCOP Hematology/Oncology Clinical Pharmacist [facility] and [facility] Oral Chemotherapy Navigation Clinics [phone] 04/14/2023 8:59 AM

## 2023-04-14 NOTE — Progress Notes (Signed)
 Has armband been applied?  Yes.    Does patient have an allergy to IV contrast dye?: No.   Has patient ever received premedication for IV contrast dye?: No.   Does patient take metformin?: No.  If patient does take metformin when was the last dose:  M  Date of lab work: 03/31/2023 BUN: 10 CR: 0.36 eGfr: >60  IV site: left chest port accessed   Has IV site been added to flowsheet?  Yes.    BP 106/67 (BP Location: Left Arm, Patient Position: Sitting)   Pulse 95   Temp 97.8 F (36.6 C) (Temporal)   Resp 18   Ht 5\' 3"  (1.6 m)   Wt 111 lb 2 oz (50.4 kg)   SpO2 97%   BMI 19.68 kg/m

## 2023-04-14 NOTE — Progress Notes (Signed)
 Called pt to inform pt of appt change on 04/21/2023.  LVM stating that an appt was added on 04/21/2023 for an EKG at 8am.  Instructed pt to please give Dr. Latanya Maudlin office a call if this will conflict with pt's scheduled.  Awaiting pt's response.

## 2023-04-16 ENCOUNTER — Other Ambulatory Visit: Payer: Self-pay

## 2023-04-16 ENCOUNTER — Ambulatory Visit (HOSPITAL_COMMUNITY)
Admission: RE | Admit: 2023-04-16 | Discharge: 2023-04-16 | Disposition: A | Discharge status: Home / Self Care | Payer: Medicaid Other | Source: Ambulatory Visit | Attending: Interventional Radiology | Admitting: Interventional Radiology

## 2023-04-16 ENCOUNTER — Other Ambulatory Visit (HOSPITAL_COMMUNITY): Payer: Self-pay

## 2023-04-16 ENCOUNTER — Other Ambulatory Visit (HOSPITAL_COMMUNITY): Payer: Self-pay | Admitting: Radiology

## 2023-04-16 ENCOUNTER — Other Ambulatory Visit: Payer: Self-pay | Admitting: Pharmacy Technician

## 2023-04-16 DIAGNOSIS — K831 Obstruction of bile duct: Secondary | ICD-10-CM | POA: Insufficient documentation

## 2023-04-16 HISTORY — PX: IR EXCHANGE BILIARY DRAIN: IMG6046

## 2023-04-16 MED ORDER — LIDOCAINE HCL 1 % IJ SOLN
INTRAMUSCULAR | Status: AC
  Filled 2023-04-16: qty 20

## 2023-04-16 MED ORDER — IOHEXOL 300 MG/ML  SOLN
50.0000 mL | Freq: Once | INTRAMUSCULAR | Status: AC | PRN
  Administered 2023-04-16: 10 mL

## 2023-04-16 MED ORDER — LIDOCAINE HCL 1 % IJ SOLN
20.0000 mL | Freq: Once | INTRAMUSCULAR | Status: AC
  Administered 2023-04-16: 7 mL via INTRADERMAL

## 2023-04-16 NOTE — Progress Notes (Signed)
 Specialty Pharmacy Initial Fill Coordination Note  Kathryn Underwood is a 63 y.o. female contacted today regarding refills of specialty medication(s) Capecitabine (XELODA) .  Patient requested Daryll Drown at [facility] at [place]  on 04/21/23   Medication will be filled on 04/21/23.   Patient is aware of $4 copayment.

## 2023-04-16 NOTE — Procedures (Signed)
 Interventional Radiology Procedure Note  Procedure: Image guided drain exchange, perc chole.  59F pigtail drain.  Complications: None  EBL: None    Recommendations: - Routine drain care, with sterile flushes    - routine wound care  Signed,  Yvone Neu. Loreta Ave, DO

## 2023-04-16 NOTE — Progress Notes (Signed)
 Oral Chemotherapy Pharmacist Encounter  Patient was counseled under telephone encounter from 04/13/23.  Sherry Ruffing, PharmD, BCPS, BCOP Hematology/Oncology Clinical Pharmacist [facility] and [facility] Oral Chemotherapy Navigation Clinics [phone] 04/16/2023 10:43 AM

## 2023-04-16 NOTE — Telephone Encounter (Signed)
 Oral Chemotherapy Pharmacist Encounter  I spoke with patient for overview of: Xeloda (capecitabine) for the treatment of pancreatic cancer in conjunction with radiation, planned duration 5 1/2 weeks.   Counseled patient on administration, dosing, side effects, monitoring, drug-food interactions, safe handling, storage, and disposal.  Patient will take Xeloda 500mg  tablets, 2 tablets (1000mg ) by mouth in AM and 3 tabs (1500mg ) by mouth in PM, within 30 minutes of finishing meals, on days of radiation only.  Xeloda and radiation start date: 04/26/23 (patient will pick up Xeloda from [facility] on 3/26 after her MD OV)  Adverse effects of Xeloda include but are not limited to: fatigue, decreased blood counts, GI upset, diarrhea, mouth sores, and hand-foot syndrome. Hand-foot syndrome: discussed use of cream such as Udderly Smooth Extra Care 20 or equivalent advanced care cream that has 20% urea content for advanced skin hydration while on Xeloda. Additionally discussed use of OTC Voltaren gel for HFS prophylaxis. Discussed recommended use of Voltaren gel is 1 finger tip application for front/backside of hands and then 1 fingertip application to bottoms of feet twice a day for up to 12 weeks.  Diarrhea: Patient will obtain Imodium (loperamide) to have on hand if they experience diarrhea. Patient knows to alert the office of 4 or more loose stools above baseline.  Reviewed with patient importance of keeping a medication schedule and plan for any missed doses. No barriers to medication adherence identified.  Medication reconciliation performed and medication/allergy list updated. Discussed with patient that she will need to hold pantoprazole while she is on Xeloda as PPIs can decrease efficacy of Xeloda. Patient understanding of this. We discussed alternatively that patient can take Pepcid (famotidine) 20 mg BID if she needs to while on Xeloda. Patient expressed  understanding. Discussed with patient the risk of Qtc prolongation with the use of Seroquel in combination with Xeloda. Informed patient she will have an EKG done at her next OV on 3/26 to assess her Qtc prior to starting therapy.   All questions answered.  Ms. Benedict voiced understanding and appreciation.   Medication education handout and medication calendar will be given to patient in clinic on 3/26. Patient knows to call the office with questions or concerns. Oral Chemotherapy Clinic phone number provided to patient.   Sherry Ruffing, PharmD, BCPS, BCOP Hematology/Oncology Clinical Pharmacist [facility] and [facility] Oral Chemotherapy Navigation Clinics [phone] 04/16/2023 10:26 AM

## 2023-04-18 ENCOUNTER — Other Ambulatory Visit: Payer: Self-pay | Admitting: Hematology

## 2023-04-20 ENCOUNTER — Other Ambulatory Visit (HOSPITAL_COMMUNITY): Payer: Self-pay

## 2023-04-20 ENCOUNTER — Other Ambulatory Visit: Payer: Self-pay

## 2023-04-20 NOTE — Assessment & Plan Note (Signed)
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 -She was readmitted to hospital for recurrent nausea and vomiting, likely partial gastric outlet obstruction from pancreatic tumor.  She is able to tolerate full liquid diet, she underwet duodenal stent placement on 01/06/2023 -she started chemo gemcitabine and abraxane on 01/26/2023  -her scan was reviewed by Dr. Freida Busman and her cancer was deemed to be unresectable, this is confirmed by Dr. Kathrynn Ducking at [facility] to start concurrent chemoradiation with Xeloda on April 26, 2023

## 2023-04-21 ENCOUNTER — Encounter: Payer: Self-pay | Admitting: Nurse Practitioner

## 2023-04-21 ENCOUNTER — Encounter: Payer: Self-pay | Admitting: Hematology

## 2023-04-21 ENCOUNTER — Inpatient Hospital Stay

## 2023-04-21 ENCOUNTER — Inpatient Hospital Stay (HOSPITAL_BASED_OUTPATIENT_CLINIC_OR_DEPARTMENT_OTHER): Admitting: Hematology

## 2023-04-21 ENCOUNTER — Inpatient Hospital Stay (HOSPITAL_BASED_OUTPATIENT_CLINIC_OR_DEPARTMENT_OTHER): Admitting: Nurse Practitioner

## 2023-04-21 ENCOUNTER — Ambulatory Visit

## 2023-04-21 DIAGNOSIS — C259 Malignant neoplasm of pancreas, unspecified: Secondary | ICD-10-CM

## 2023-04-21 DIAGNOSIS — R530 Neoplastic (malignant) related fatigue: Secondary | ICD-10-CM

## 2023-04-21 DIAGNOSIS — E860 Dehydration: Secondary | ICD-10-CM

## 2023-04-21 DIAGNOSIS — Z5111 Encounter for antineoplastic chemotherapy: Secondary | ICD-10-CM | POA: Diagnosis not present

## 2023-04-21 DIAGNOSIS — G893 Neoplasm related pain (acute) (chronic): Secondary | ICD-10-CM | POA: Diagnosis not present

## 2023-04-21 DIAGNOSIS — Z515 Encounter for palliative care: Secondary | ICD-10-CM

## 2023-04-21 LAB — CBC WITH DIFFERENTIAL (CANCER CENTER ONLY)
Abs Immature Granulocytes: 0.17 10*3/uL — ABNORMAL HIGH (ref 0.00–0.07)
Basophils Absolute: 0.2 10*3/uL — ABNORMAL HIGH (ref 0.0–0.1)
Basophils Relative: 1 %
Eosinophils Absolute: 0.2 10*3/uL (ref 0.0–0.5)
Eosinophils Relative: 1 %
HCT: 32.6 % — ABNORMAL LOW (ref 36.0–46.0)
Hemoglobin: 10.2 g/dL — ABNORMAL LOW (ref 12.0–15.0)
Immature Granulocytes: 1 %
Lymphocytes Relative: 16 %
Lymphs Abs: 2.4 10*3/uL (ref 0.7–4.0)
MCH: 29.2 pg (ref 26.0–34.0)
MCHC: 31.3 g/dL (ref 30.0–36.0)
MCV: 93.4 fL (ref 80.0–100.0)
Monocytes Absolute: 1.9 10*3/uL — ABNORMAL HIGH (ref 0.1–1.0)
Monocytes Relative: 12 %
Neutro Abs: 10.7 10*3/uL — ABNORMAL HIGH (ref 1.7–7.7)
Neutrophils Relative %: 69 %
Platelet Count: 778 10*3/uL — ABNORMAL HIGH (ref 150–400)
RBC: 3.49 MIL/uL — ABNORMAL LOW (ref 3.87–5.11)
RDW: 22 % — ABNORMAL HIGH (ref 11.5–15.5)
WBC Count: 15.6 10*3/uL — ABNORMAL HIGH (ref 4.0–10.5)
nRBC: 0 % (ref 0.0–0.2)

## 2023-04-21 LAB — CMP (CANCER CENTER ONLY)
ALT: 45 U/L — ABNORMAL HIGH (ref 0–44)
AST: 57 U/L — ABNORMAL HIGH (ref 15–41)
Albumin: 3 g/dL — ABNORMAL LOW (ref 3.5–5.0)
Alkaline Phosphatase: 872 U/L — ABNORMAL HIGH (ref 38–126)
Anion gap: 4 — ABNORMAL LOW (ref 5–15)
BUN: 11 mg/dL (ref 8–23)
CO2: 31 mmol/L (ref 22–32)
Calcium: 8.7 mg/dL — ABNORMAL LOW (ref 8.9–10.3)
Chloride: 101 mmol/L (ref 98–111)
Creatinine: 0.38 mg/dL — ABNORMAL LOW (ref 0.44–1.00)
GFR, Estimated: >60 mL/min (ref >60)
Glucose, Bld: 102 mg/dL — ABNORMAL HIGH (ref 70–99)
Potassium: 4.3 mmol/L (ref 3.5–5.1)
Sodium: 136 mmol/L (ref 135–145)
Total Bilirubin: 0.6 mg/dL (ref 0.0–1.2)
Total Protein: 6 g/dL — ABNORMAL LOW (ref 6.5–8.1)

## 2023-04-21 MED ORDER — SODIUM CHLORIDE 0.9% FLUSH
10.0000 mL | Freq: Once | INTRAVENOUS | Status: AC | PRN
  Administered 2023-04-21: 10 mL

## 2023-04-21 MED ORDER — FAMOTIDINE 20 MG PO TABS: 20.0000 mg | ORAL_TABLET | Freq: Two times a day (BID) | ORAL | 3 refills | Status: DC

## 2023-04-21 MED ORDER — HEPARIN SOD (PORK) LOCK FLUSH 100 UNIT/ML IV SOLN
500.0000 [IU] | Freq: Once | INTRAVENOUS | Status: AC | PRN
Start: 2023-04-21 — End: 2023-04-21
  Administered 2023-04-21: 500 [IU]

## 2023-04-21 MED ORDER — OXYCODONE HCL 10 MG PO TABS: 10.0000 mg | ORAL_TABLET | ORAL | 0 refills | Status: DC | PRN

## 2023-04-21 NOTE — Progress Notes (Signed)
 Palliative Medicine [facility]  [phone]) [phone] [phone]) [phone]   Name: Kathryn Underwood Date: 04/21/2023 MRN: [id number]  DOB: Feb 10, 1960  Patient Care Team: Allwardt, Crist Infante, PA-C as PCP - General (Physician Assistant) Geraldine Contras, MD as Referring Physician (Psychiatry) Malachy Mood, MD as Consulting Physician (Hematology and Oncology) Pickenpack-Cousar, Arty Baumgartner, NP as Nurse Practitioner [facility] and Palliative Medicine)    INTERVAL HISTORY: Kathryn Underwood is a 63 y.o. female with oncologic medical history including pancreatic adenocarcinoma (11/2022) and is status post duodenal stent placement on 01/06/2023. Patient is not a surgical candidate and is currently on palliative systemic therapy of gemcitabine and abraxane. Palliative ask to see for symptom management and goals of care.   SOCIAL HISTORY:     reports that she has been smoking cigarettes. She has never used smokeless tobacco. She reports that she does not currently use alcohol after a past usage of about 16.0 standard drinks of alcohol per week. She reports that she does not currently use drugs after having used the following drugs: Cocaine and Marijuana.  ADVANCE DIRECTIVES:  None on file   CODE STATUS: DNR  PAST MEDICAL HISTORY: Past Medical History:  Diagnosis Date   Alcohol abuse    Anxiety    Chickenpox    Depression    Neuromuscular disorder (HCC)    neuropathy   Pancreatic cancer (HCC)    Substance abuse (HCC)     ALLERGIES:  has no known allergies.  MEDICATIONS:  Current Outpatient Medications  Medication Sig Dispense Refill   famotidine (PEPCID) 20 MG tablet Take 1 tablet (20 mg total) by mouth 2 (two) times daily. 60 tablet 3   acetaminophen (TYLENOL) 500 MG tablet Take 500-1,000 mg by mouth every 6 (six) hours as needed for moderate pain (pain score 4-6).     capecitabine (XELODA) 500 MG tablet Take 2 tablets (1000 mg total) by mouth in morning and 3 tablets  (1500 mg total) by mouth in evening, every 10-12 hours. Take within 30 minutes after meals. Take only on days of radiation, Monday through Fridays. 75 tablet 1   ELIQUIS 5 MG TABS tablet TAKE 1 TABLET BY MOUTH TWICE A DAY 60 tablet 1   Ensure Max Protein (ENSURE MAX PROTEIN) LIQD Take 330 mLs (11 oz total) by mouth 2 (two) times daily.     furosemide (LASIX) 20 MG tablet TAKE 1 TABLET (20 MG TOTAL) BY MOUTH DAILY. FOR 5 DAYS THEN AS NEEDED FOR LEG EDEMA 20 tablet 0   gabapentin (NEURONTIN) 300 MG capsule Take 2 capsules (600 mg total) by mouth 2 (two) times daily. 120 capsule 3   lidocaine-prilocaine (EMLA) cream Apply to affected area once 30 g 3   mirtazapine (REMERON) 45 MG tablet Take 1 tablet (45 mg total) by mouth at bedtime. 30 tablet 3   ondansetron (ZOFRAN) 8 MG tablet Take 1 tablet (8 mg total) by mouth every 8 (eight) hours as needed for nausea or vomiting. 30 tablet 1   [START ON 04/23/2023] Oxycodone HCl 10 MG TABS Take 1 tablet (10 mg total) by mouth every 4 (four) hours as needed. 90 tablet 0   potassium chloride (KLOR-CON M) 10 MEQ tablet TAKE 1 TABLET (10 MEQ TOTAL) BY MOUTH DAILY. WHEN YOU TAKE LASIX 20 tablet 0   prochlorperazine (COMPAZINE) 10 MG tablet Take 1 tablet (10 mg total) by mouth every 6 (six) hours as needed for nausea or vomiting. 30 tablet 1   QUEtiapine (SEROQUEL)  100 MG tablet Take 2 tablets (200 mg total) by mouth at bedtime. 30 tablet 3   sodium chloride flush (NS) 0.9 % SOLN Use 5 - 10 mLs to flush abdominal drain once daily as directed 300 mL 3   sucralfate (CARAFATE) 1 GM/10ML suspension TAKE 10 MLS (1 G TOTAL) BY MOUTH 4 (FOUR) TIMES DAILY - WITH MEALS AND AT BEDTIME. 420 mL 1   Vitamin D, Ergocalciferol, (DRISDOL) 1.25 MG (50000 UNIT) CAPS capsule Take 1 capsule (50,000 Units total) by mouth every 7 (seven) days. 5 capsule 6   No current facility-administered medications for this visit.    VITAL SIGNS: There were no vitals taken for this visit. There were  no vitals filed for this visit.  Estimated body mass index is 19.61 kg/m as calculated from the following:   Height as of 04/14/23: 5\' 3"  (1.6 m).   Weight as of an earlier encounter on 04/21/23: 110 lb 11.2 oz (50.2 kg).   PERFORMANCE STATUS (ECOG) : 1 - Symptomatic but completely ambulatory  Physical Exam General: NAD Cardiovascular: regular rate and rhythm Pulmonary: normal breathing pattern Extremities: no edema, no joint deformities Skin: no rashes Neurological: AAO x3  IMPRESSION: Discussed the use of AI scribe software for clinical note transcription with the patient, who gave verbal consent to proceed.  History of Present Illness Kathryn Underwood is a 63 year old female who presents for palliative care follow-up.  No acute distress noted.  She is accompanied by her significant other.  Denies concerns with nausea, vomiting, constipation, or diarrhea. She feels much better overall but acknowledges overexerting herself, leading to episodes of pain. A recent incident in the garden resulted in pain and the need to rest.  Her appetite is good, though she sometimes limits her fluid intake to avoid frequent bathroom trips. She notes a slight weight loss despite good intake.  Current weight is stable at 110 pounds. She experiences peripheral edema, which is managed with compression stockings and leg elevation. She reports improvement in swelling with these measures.  No swelling noted on exam today. She experiences dry skin on her legs, exacerbated by wearing compression socks, we discussed ways to moisturize skin.  Amya is currently taking Protonix however this is contraindicated with the use of Xeloda.  We discussed switching to famotidine daily.  She verbalized understanding.  We discussed her pain at length.  She is currently taking oxycodone 10 mg as needed, anticipating a refill in about five days. She also takes gabapentin, three capsules at bedtime, totaling 900 mg, along with  mirtazapine and Seroquel at bedtime.  Feels her current pain regimen is managing her pain.  No adjustments needed at this time.  We will continue to closely monitor and adjust accordingly.  All questions answered and support provided.  Goals of Care 03/24/23: We discussed her current illness and what it means in the larger context of her on-going co-morbidities. Natural disease trajectory and expectations were discussed. Ms. Lavallee and her partner are clear in their understanding of her cancer and disease trajectory. She is able to openly discuss her incurable cancer and that she will face end-of-life in the near future.  She is clear in her expressed wishes to continue to treat the treatable allow her every opportunity to continue to thrive while focusing on her quality of life.  Her quality of life is most important versus quantity.  She shares that her children's had a difficult time with her prognosis however is now better coping  due to the support of Marvia Pickles.    Patient and significant other is tearful during discussions as they share their thoughts and feelings.  She expressed sadness after having a consultation with [facility] only to be told that her cancer is inoperable.  Patient was previously taking Seroquel 100 mg at bedtime in addition to mirtazapine 45 mg at bedtime as prescribed by her provider at [facility].  Unfortunately she has been unable to get this medication filled due to conflicts and appointments as she has made her cancer treatments a priority.  Multiple attempts to have virtual visits however there seems to be some concern for follow-through.  Will refill medications allow patient to continue without fear of changes due to lack of medication.   I empathetically approach discussions in regards to healthcare limitations and advanced directives.  Weyman Croon shares he provided patient with a book that prepares family after the death of a loved one that identifies their wishes as well as any  other information they would need to continue with her presents.  Juni states she does not have a completed advanced directive however they are in the process of completing and have upcoming appointment with their attorney.  We discussed at length healthcare directives including life-sustaining measures, artificial feedings, hydration.  Patient is clear in her expressed wishes that her desires for natural death.  She also would not be interested in artificial feedings such as PEG tube placement.  She confirms wishes for DNR/DNI.Patient is aware if assistance is needed with completion of healthcare directives these services are offered here at the cancer.    I discussed the importance of continued conversation with family and their medical providers regarding overall plan of care and treatment options, ensuring decisions are within the context of the patients values and GOCs.  Assessment and Plan Assessment & Plan Pain management Pain likely due to overexertion. Medication schedule on track.  Pain is overall well-controlled.  No adjustments at this time. - Refill oxycodone 10 mg every 4 hours as needed [facility] - Continue gabapentin 900 mg at bedtime.  Gastrointestinal management Advised against Protonix due to contraindications with Xeloda as discussed with Dr. Mosetta Putt.  Suggested switch to famotidine. - Switch from Protonix to famotidine 20mg .   Edema management Occasional leg swelling managed with compression socks and leg elevation. - Continue wearing compression socks during the day. - Elevate legs to reduce swelling.  Skin dryness Dry skin on legs likely due to compression socks. Suggested Vaseline spray for moisturizing. - Use Vaseline spray for leg moisturizing.  Goals of Care Considering mortality and planning a chemotherapy break, possibly for travel. Extensive goals of care discussion at previous visits. She is clear in her expressed wishes to continue to treat the treatable  allow her every opportunity to continue to thrive while focusing on her quality of life.  Her quality of life is most important versus quantity.  - Consider travel plans during chemotherapy break if approved by oncology. -Patient and significant other are in the process of completing advanced directives with their attorney.  States she has an appointment on Monday. -She is aware if she needs assistance with completing her healthcare advanced directives this service is offered here at the cancer center.  Extensive education provided on document. -Patient is clear in her expressed wishes for no life-sustaining measures as her desire is for natural death.  Confirms DNR/DNI.  Follow-up Scheduled for follow-up appointment and lab work. - Schedule follow-up appointment on April 23. - Perform  lab work as scheduled per oncology.  Patient expressed understanding and was in agreement with this plan. She also understands that She can call the clinic at any time with any questions, concerns, or complaints.   Any controlled substances utilized were prescribed in the context of palliative care. PDMP has been reviewed.   Visit consisted of counseling and education dealing with the complex and emotionally intense issues of symptom management and palliative care in the setting of serious and potentially life-threatening illness.  Willette Alma, AGPCNP-BC  Palliative Medicine Team/ Cancer Center

## 2023-04-21 NOTE — Progress Notes (Signed)
 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 -She was readmitted to hospital for recurrent nausea and vomiting, likely partial gastric outlet obstruction from pancreatic tumor.  She is able to tolerate full liquid diet, she underwet duodenal stent placement on 01/06/2023 -she started chemo gemcitabine and abraxane on 01/26/2023  -her scan was reviewed by Dr. Freida Busman and her cancer was deemed to be unresectable, this is confirmed by Dr. Kathrynn Ducking at [facility] to start concurrent chemoradiation with Xeloda on April 26, 2023    Assessment and Plan    Pancreatic cancer She is undergoing treatment for pancreatic cancer and will start chemoradiation next week. The treatment plan includes five and a half weeks of radiation with concurrent administration of Xeloda (capecitabine) as a radiation sensitizer. The goal is palliative, aiming to provide a longer break from cancer progression and alleviate symptoms such as bowel obstruction and discomfort. Radiation may shrink the tumor, potentially relieving pain. Xeloda's potential side effects include hand-foot syndrome, and she was advised on  skin care to mitigate these effects. She was informed about Jonna Coup interaction with her current medications and the need for EKG monitoring due to potential cardiac effects. Radiation is not planned to be repeated, but chemotherapy may resume if there is cancer progression and she can tolerate it. Tumor markers and scans will assess treatment response. - Start chemoradiation next week. - Administer Xeloda twice daily, Monday through Friday, with food. - Monitor tumor markers and repeat scans in 2-3 months to assess treatment response. - Advise on skin care to prevent hand-foot syndrome. - Perform EKG monitoring due to potential cardiac effects of Xeloda. - Stop certain medications on the days of chemotherapy to avoid interactions. - Use Pepcid for acid reflux, ensuring it is taken at least two hours after Xeloda. - Refer to palliative care starting next Monday.  Medication management She is on multiple medications, some of which interact with Xeloda. She was advised to stop certain medications on the days of chemotherapy to avoid interactions affecting Xeloda's absorption and efficacy. She was instructed to stop taking Carafate and another unspecified medication on the days of chemotherapy and radiation. Pepcid was recommended for managing acid reflux, with specific timing instructions to avoid interference with Xeloda absorption. - Stop Carafate and another unspecified medication on the days of chemotherapy and radiation. - Use Pepcid for acid reflux, ensuring it is taken at least two hours after Xeloda.   Plan -She was started concurrent chemoradiation with Xeloda next Monday -Lab and follow-up every 2 weeks when she is on chemo and radiation.        SUMMARY OF ONCOLOGIC HISTORY: Oncology History  Pancreatic adenocarcinoma (HCC)  12/12/2022 Cancer Staging   Staging form: Exocrine Pancreas, AJCC 8th Edition - Clinical stage from 12/12/2022: Stage IIA (cT3,  cN0, cM0) - Signed by Malachy Mood, MD on 12/28/2022 Total positive nodes: 0   12/16/2022 Initial Diagnosis   Pancreatic adenocarcinoma (HCC)   01/26/2023 -  Chemotherapy   Patient is on Treatment Plan : PANCREATIC Abraxane D1,8 + Gemcitabine D1,8 q21d      Genetic Testing   Negative 76 gene [facility] CancerNext-Expanded +RNAinsight panel. The CancerNext-Expanded gene panel offered by [facility] and includes sequencing, rearrangement, and RNA analysis for the following 76 genes: AIP, ALK, APC, ATM, AXIN2, BAP1, BARD1, BMPR1A, BRCA1, BRCA2, BRIP1, CDC73, CDH1, CDK4, CDKN1B, CDKN2A, CEBPA, CHEK2, CTNNA1, DDX41, DICER1, ETV6, FH, FLCN, GATA2, LZTR1, MAX, MBD4, MEN1, MET, MLH1, MSH2, MSH3, MSH6, MUTYH, NF1, NF2, NTHL1, PALB2, PHOX2B, PMS2, POT1, PRKAR1A, PTCH1, PTEN, RAD51C, RAD51D, RB1, RET, RUNX1, SDHA, SDHAF2, SDHB, SDHC, SDHD, SMAD4, SMARCA4, SMARCB1, SMARCE1, STK11, SUFU, TMEM127, TP53, TSC1, TSC2, VHL, and WT1 (sequencing and deletion/duplication); EGFR, HOXB13, KIT, MITF, PDGFRA, POLD1, and POLE (sequencing only); EPCAM and GREM1 (deletion/duplication only). Report date 03/19/23.    03/16/2023 Imaging   CT CAP with pancreatic protocol and CTA imaging - done [facility] Impression:  1. Local advanced infiltrative mass centered within the pancreatic head with extensive vascular involvement, as above.   2. A 7 mm left upper lobe pulmonary nodule is indeterminate.  3. Moderate biliary dilation with obstruction of the level of the extra hepatic bile ducts and possibly owing to obstruction from the pancreatic head mass and/or inflammatory stricture.   4. Multiple pancreatic collections, largest anterior to the pancreatic body, possibly related to prior pancreatitis.  5. Ovoid nodule within the superior right breast is indeterminant. Consider further evaluation with dedicated mammogram as clinically appropriate for this patient.        Discussed the use of AI scribe software for clinical note transcription with the  patient, who gave verbal consent to proceed.  History of Present Illness   The patient, a 63 year old female with a known diagnosis of pancreatic cancer, is scheduled to start chemo-radiation therapy next week. She has not yet met the radiation oncologist in person but has had a phone conversation. She has questions about the chemotherapy pill, which she has not yet received, and the radiation therapy. She is also concerned about potential side effects of the chemotherapy pill, including peeling of the skin on her hands and feet. She is also concerned about potential interactions between the chemotherapy pill and her current medications. She is also concerned about the potential impact of the chemotherapy pill on her heart and has been reassured that her heart function will be monitored. She is aware that the chemotherapy and radiation therapy are not expected to cure her cancer but may shrink the tumor and relieve some of her symptoms.         All other systems were reviewed with the patient and are negative.  MEDICAL HISTORY:  Past Medical History:  Diagnosis Date   Alcohol abuse    Anxiety    Chickenpox    Depression    Neuromuscular disorder (HCC)    neuropathy   Pancreatic cancer (HCC)    Substance abuse (HCC)     SURGICAL HISTORY: Past Surgical History:  Procedure Laterality Date   BIOPSY  12/10/2022   Procedure: BIOPSY;  Surgeon: Mansouraty, Netty Starring., MD;  Location: [facility];  Service: Gastroenterology;;   CESAREAN SECTION     DUODENAL STENT PLACEMENT N/A 01/06/2023   Procedure: DUODENAL STENT PLACEMENT;  Surgeon: Lemar Lofty., MD;  Location: [facility];  Service: Gastroenterology;  Laterality: N/A;   ESOPHAGOGASTRODUODENOSCOPY N/A 08/14/2021   Procedure: ESOPHAGOGASTRODUODENOSCOPY (EGD);  Surgeon: Rachael Fee, MD;  Location: [facility];  Service: Gastroenterology;  Laterality: N/A;   ESOPHAGOGASTRODUODENOSCOPY N/A 12/12/2022   Procedure:  ESOPHAGOGASTRODUODENOSCOPY (EGD);  Surgeon: Lemar Lofty., MD;  Location: [facility];  Service: Gastroenterology;  Laterality: N/A;   ESOPHAGOGASTRODUODENOSCOPY (EGD) WITH PROPOFOL N/A 12/10/2022   Procedure: ESOPHAGOGASTRODUODENOSCOPY (EGD) WITH PROPOFOL;  Surgeon: Meridee Score Netty Starring., MD;  Location: [facility];  Service: Gastroenterology;  Laterality: N/A;   ESOPHAGOGASTRODUODENOSCOPY (EGD) WITH PROPOFOL N/A 01/06/2023   Procedure: ESOPHAGOGASTRODUODENOSCOPY (EGD) WITH PROPOFOL;  Surgeon: Meridee Score Netty Starring., MD;  Location: [facility];  Service: Gastroenterology;  Laterality: N/A;  with duodenal stent placement   EUS N/A 08/14/2021   Procedure: UPPER ENDOSCOPIC ULTRASOUND (EUS) RADIAL;  Surgeon: Rachael Fee, MD;  Location: [facility];  Service: Gastroenterology;  Laterality: N/A;   EUS N/A 12/12/2022   Procedure: UPPER ENDOSCOPIC ULTRASOUND (EUS) LINEAR;  Surgeon: Lemar Lofty., MD;  Location: [facility];  Service: Gastroenterology;  Laterality: N/A;   EUS N/A 01/06/2023   Procedure: FULL UPPER ENDOSCOPIC ULTRASOUND (EUS) RADIAL;  Surgeon: Meridee Score Netty Starring., MD;  Location: [facility];  Service: Gastroenterology;  Laterality: N/A;   FINE NEEDLE ASPIRATION N/A 08/14/2021   Procedure: FINE NEEDLE ASPIRATION (FNA) LINEAR;  Surgeon: Rachael Fee, MD;  Location: [facility];  Service: Gastroenterology;  Laterality: N/A;   FINE NEEDLE ASPIRATION N/A 12/12/2022   Procedure: FINE NEEDLE ASPIRATION (FNA) LINEAR;  Surgeon: Lemar Lofty., MD;  Location: [facility];  Service: Gastroenterology;  Laterality: N/A;   FINE NEEDLE ASPIRATION  01/06/2023   Procedure: FINE NEEDLE ASPIRATION;  Surgeon: Meridee Score Netty Starring., MD;  Location: [facility];  Service: Gastroenterology;;   IR EXCHANGE BILIARY DRAIN  01/29/2023   IR EXCHANGE BILIARY DRAIN  03/05/2023   IR EXCHANGE BILIARY DRAIN  04/16/2023   IR IMAGING GUIDED PORT INSERTION  12/18/2022   IR  PERC CHOLECYSTOSTOMY  12/13/2022   TONSILLECTOMY      I have reviewed the social history and family history with the patient and they are unchanged from previous note.  ALLERGIES:  has no known allergies.  MEDICATIONS:  Current Outpatient Medications  Medication Sig Dispense Refill   acetaminophen (TYLENOL) 500 MG tablet Take 500-1,000 mg by mouth every 6 (six) hours as needed for moderate pain (pain score 4-6).     capecitabine (XELODA) 500 MG tablet Take 2 tablets (1000 mg total) by mouth in morning and 3 tablets (1500 mg total) by mouth in evening, every 10-12 hours. Take within 30 minutes after meals. Take only on days of radiation, Monday through Fridays. 75 tablet 1   ELIQUIS 5 MG TABS tablet TAKE 1 TABLET BY MOUTH TWICE A DAY 60 tablet 1   Ensure Max Protein (ENSURE MAX PROTEIN) LIQD Take 330 mLs (11 oz total) by mouth 2 (two) times daily.     famotidine (PEPCID) 20 MG tablet Take 1 tablet (20 mg total) by mouth 2 (two) times daily. 60 tablet 3   furosemide (LASIX) 20 MG tablet TAKE 1 TABLET (20 MG TOTAL) BY MOUTH DAILY. FOR 5 DAYS THEN AS NEEDED FOR LEG EDEMA 20 tablet 0   gabapentin (NEURONTIN) 300 MG capsule Take 2 capsules (600 mg total) by mouth 2 (two) times daily. 120 capsule 3   lidocaine-prilocaine (EMLA) cream Apply to affected area once 30 g 3   mirtazapine (REMERON) 45 MG tablet Take 1 tablet (45 mg total)  by mouth at bedtime. 30 tablet 3   ondansetron (ZOFRAN) 8 MG tablet Take 1 tablet (8 mg total) by mouth every 8 (eight) hours as needed for nausea or vomiting. 30 tablet 1   [START ON 04/23/2023] Oxycodone HCl 10 MG TABS Take 1 tablet (10 mg total) by mouth every 4 (four) hours as needed. 90 tablet 0   potassium chloride (KLOR-CON M) 10 MEQ tablet TAKE 1 TABLET (10 MEQ TOTAL) BY MOUTH DAILY. WHEN YOU TAKE LASIX 20 tablet 0   prochlorperazine (COMPAZINE) 10 MG tablet Take 1 tablet (10 mg total) by mouth every 6 (six) hours as needed for nausea or vomiting. 30 tablet 1    QUEtiapine (SEROQUEL) 100 MG tablet Take 2 tablets (200 mg total) by mouth at bedtime. 30 tablet 3   sodium chloride flush (NS) 0.9 % SOLN Use 5 - 10 mLs to flush abdominal drain once daily as directed 300 mL 3   sucralfate (CARAFATE) 1 GM/10ML suspension TAKE 10 MLS (1 G TOTAL) BY MOUTH 4 (FOUR) TIMES DAILY - WITH MEALS AND AT BEDTIME. 420 mL 1   Vitamin D, Ergocalciferol, (DRISDOL) 1.25 MG (50000 UNIT) CAPS capsule Take 1 capsule (50,000 Units total) by mouth every 7 (seven) days. 5 capsule 6   No current facility-administered medications for this visit.    PHYSICAL EXAMINATION: ECOG PERFORMANCE STATUS: 2 - Symptomatic, <50% confined to bed  There were no vitals filed for this visit. Wt Readings from Last 3 Encounters:  04/21/23 110 lb 11.2 oz (50.2 kg)  04/14/23 111 lb 2 oz (50.4 kg)  03/31/23 112 lb 1.6 oz (50.8 kg)     GENERAL:alert, no distress and comfortable SKIN: skin color, texture, turgor are normal, no rashes or significant lesions EYES: normal, Conjunctiva are pink and non-injected, sclera clear NECK: supple, thyroid normal size, non-tender, without nodularity LYMPH:  no palpable lymphadenopathy in the cervical, axillary  LUNGS: clear to auscultation and percussion with normal breathing effort HEART: regular rate & rhythm and no murmurs and no lower extremity edema ABDOMEN:abdomen soft, non-tender and normal bowel sounds Musculoskeletal:no cyanosis of digits and no clubbing  NEURO: alert & oriented x 3 with fluent speech, no focal motor/sensory deficits    LABORATORY DATA:  I have reviewed the data as listed    Latest Ref Rng & Units 04/21/2023    9:55 AM 03/31/2023    8:44 AM 03/24/2023   10:14 AM  CBC  WBC 4.0 - 10.5 K/uL 15.6  8.3  7.2   Hemoglobin 12.0 - 15.0 g/dL 16.1  9.2  9.9   Hematocrit 36.0 - 46.0 % 32.6  28.7  31.2   Platelets 150 - 400 K/uL 778  532  930         Latest Ref Rng & Units 04/21/2023    9:55 AM 03/31/2023    8:44 AM 03/24/2023   10:14 AM   CMP  Glucose 70 - 99 mg/dL 096  045  409   BUN 8 - 23 mg/dL 11  10  12    Creatinine 0.44 - 1.00 mg/dL 8.11  9.14  7.82   Sodium 135 - 145 mmol/L 136  134  136   Potassium 3.5 - 5.1 mmol/L 4.3  4.0  3.9   Chloride 98 - 111 mmol/L 101  100  103   CO2 22 - 32 mmol/L 31  29  29    Calcium 8.9 - 10.3 mg/dL 8.7  8.5  8.5   Total Protein 6.5 - 8.1  g/dL 6.0  5.5  5.4   Total Bilirubin 0.0 - 1.2 mg/dL 0.6  0.4  0.4   Alkaline Phos 38 - 126 U/L 872  340  354   AST 15 - 41 U/L 57  17  15   ALT 0 - 44 U/L 45  11  15       RADIOGRAPHIC STUDIES: I have personally reviewed the radiological images as listed and agreed with the findings in the report. No results found.    No orders of the defined types were placed in this encounter.  All questions were answered. The patient knows to call the clinic with any problems, questions or concerns. No barriers to learning was detected. The total time spent in the appointment was 25 minutes.     Malachy Mood, MD 04/21/2023

## 2023-04-22 LAB — CANCER ANTIGEN 19-9: CA 19-9: 118 U/mL — ABNORMAL HIGH (ref 0–35)

## 2023-04-23 DIAGNOSIS — Z510 Encounter for antineoplastic radiation therapy: Secondary | ICD-10-CM | POA: Diagnosis not present

## 2023-04-24 ENCOUNTER — Other Ambulatory Visit (HOSPITAL_COMMUNITY): Payer: Self-pay

## 2023-04-26 ENCOUNTER — Other Ambulatory Visit: Payer: Self-pay

## 2023-04-26 ENCOUNTER — Ambulatory Visit
Admission: RE | Admit: 2023-04-26 | Discharge: 2023-04-26 | Disposition: A | Discharge status: Home / Self Care | Source: Ambulatory Visit | Attending: Radiation Oncology | Admitting: Radiation Oncology

## 2023-04-26 DIAGNOSIS — Z510 Encounter for antineoplastic radiation therapy: Secondary | ICD-10-CM | POA: Diagnosis not present

## 2023-04-26 LAB — RAD ONC ARIA SESSION SUMMARY
Course Elapsed Days: 0
Plan Fractions Treated to Date: 1
Plan Prescribed Dose Per Fraction: 1.8 Gy
Plan Total Fractions Prescribed: 25
Plan Total Prescribed Dose: 45 Gy
Reference Point Dosage Given to Date: 1.8 Gy
Reference Point Session Dosage Given: 1.8 Gy
Session Number: 1

## 2023-04-27 ENCOUNTER — Other Ambulatory Visit: Payer: Self-pay

## 2023-04-27 ENCOUNTER — Ambulatory Visit
Admission: RE | Admit: 2023-04-27 | Discharge: 2023-04-27 | Disposition: A | Discharge status: Home / Self Care | Source: Ambulatory Visit | Attending: Radiation Oncology | Admitting: Radiation Oncology

## 2023-04-27 DIAGNOSIS — D72829 Elevated white blood cell count, unspecified: Secondary | ICD-10-CM | POA: Diagnosis not present

## 2023-04-27 DIAGNOSIS — C258 Malignant neoplasm of overlapping sites of pancreas: Secondary | ICD-10-CM | POA: Diagnosis present

## 2023-04-27 DIAGNOSIS — F17210 Nicotine dependence, cigarettes, uncomplicated: Secondary | ICD-10-CM | POA: Diagnosis not present

## 2023-04-27 DIAGNOSIS — C259 Malignant neoplasm of pancreas, unspecified: Secondary | ICD-10-CM

## 2023-04-27 DIAGNOSIS — Z515 Encounter for palliative care: Secondary | ICD-10-CM | POA: Insufficient documentation

## 2023-04-27 DIAGNOSIS — Z510 Encounter for antineoplastic radiation therapy: Secondary | ICD-10-CM | POA: Diagnosis not present

## 2023-04-27 DIAGNOSIS — E871 Hypo-osmolality and hyponatremia: Secondary | ICD-10-CM | POA: Insufficient documentation

## 2023-04-27 LAB — RAD ONC ARIA SESSION SUMMARY
Course Elapsed Days: 1
Plan Fractions Treated to Date: 2
Plan Prescribed Dose Per Fraction: 1.8 Gy
Plan Total Fractions Prescribed: 25
Plan Total Prescribed Dose: 45 Gy
Reference Point Dosage Given to Date: 3.6 Gy
Reference Point Session Dosage Given: 1.8 Gy
Session Number: 2

## 2023-04-28 ENCOUNTER — Inpatient Hospital Stay

## 2023-04-28 ENCOUNTER — Telehealth: Payer: Self-pay | Admitting: *Deleted

## 2023-04-28 ENCOUNTER — Ambulatory Visit
Admission: RE | Admit: 2023-04-28 | Discharge: 2023-04-28 | Disposition: A | Discharge status: Home / Self Care | Source: Ambulatory Visit | Attending: Radiation Oncology | Admitting: Radiation Oncology

## 2023-04-28 ENCOUNTER — Other Ambulatory Visit: Payer: Self-pay

## 2023-04-28 DIAGNOSIS — E871 Hypo-osmolality and hyponatremia: Secondary | ICD-10-CM | POA: Insufficient documentation

## 2023-04-28 DIAGNOSIS — C259 Malignant neoplasm of pancreas, unspecified: Secondary | ICD-10-CM

## 2023-04-28 DIAGNOSIS — C250 Malignant neoplasm of head of pancreas: Secondary | ICD-10-CM | POA: Insufficient documentation

## 2023-04-28 DIAGNOSIS — E860 Dehydration: Secondary | ICD-10-CM

## 2023-04-28 DIAGNOSIS — Z510 Encounter for antineoplastic radiation therapy: Secondary | ICD-10-CM | POA: Diagnosis not present

## 2023-04-28 DIAGNOSIS — R112 Nausea with vomiting, unspecified: Secondary | ICD-10-CM | POA: Insufficient documentation

## 2023-04-28 DIAGNOSIS — I959 Hypotension, unspecified: Secondary | ICD-10-CM | POA: Insufficient documentation

## 2023-04-28 LAB — CBC WITH DIFFERENTIAL (CANCER CENTER ONLY)
Abs Immature Granulocytes: 0.05 10*3/uL (ref 0.00–0.07)
Basophils Absolute: 0.1 10*3/uL (ref 0.0–0.1)
Basophils Relative: 1 %
Eosinophils Absolute: 0.3 10*3/uL (ref 0.0–0.5)
Eosinophils Relative: 3 %
HCT: 32.1 % — ABNORMAL LOW (ref 36.0–46.0)
Hemoglobin: 10.7 g/dL — ABNORMAL LOW (ref 12.0–15.0)
Immature Granulocytes: 1 %
Lymphocytes Relative: 13 %
Lymphs Abs: 1.3 10*3/uL (ref 0.7–4.0)
MCH: 30.4 pg (ref 26.0–34.0)
MCHC: 33.3 g/dL (ref 30.0–36.0)
MCV: 91.2 fL (ref 80.0–100.0)
Monocytes Absolute: 1.1 10*3/uL — ABNORMAL HIGH (ref 0.1–1.0)
Monocytes Relative: 10 %
Neutro Abs: 7.7 10*3/uL (ref 1.7–7.7)
Neutrophils Relative %: 72 %
Platelet Count: 409 10*3/uL — ABNORMAL HIGH (ref 150–400)
RBC: 3.52 MIL/uL — ABNORMAL LOW (ref 3.87–5.11)
RDW: 22.4 % — ABNORMAL HIGH (ref 11.5–15.5)
WBC Count: 10.6 10*3/uL — ABNORMAL HIGH (ref 4.0–10.5)
nRBC: 0 % (ref 0.0–0.2)

## 2023-04-28 LAB — CMP (CANCER CENTER ONLY)
ALT: 154 U/L — ABNORMAL HIGH (ref 0–44)
AST: 191 U/L (ref 15–41)
Albumin: 3.2 g/dL — ABNORMAL LOW (ref 3.5–5.0)
Alkaline Phosphatase: 973 U/L — ABNORMAL HIGH (ref 38–126)
Anion gap: 5 (ref 5–15)
BUN: 16 mg/dL (ref 8–23)
CO2: 30 mmol/L (ref 22–32)
Calcium: 8.9 mg/dL (ref 8.9–10.3)
Chloride: 102 mmol/L (ref 98–111)
Creatinine: 0.41 mg/dL — ABNORMAL LOW (ref 0.44–1.00)
GFR, Estimated: >60 mL/min (ref >60)
Glucose, Bld: 126 mg/dL — ABNORMAL HIGH (ref 70–99)
Potassium: 4.1 mmol/L (ref 3.5–5.1)
Sodium: 137 mmol/L (ref 135–145)
Total Bilirubin: 4.5 mg/dL (ref 0.0–1.2)
Total Protein: 6.2 g/dL — ABNORMAL LOW (ref 6.5–8.1)

## 2023-04-28 LAB — RAD ONC ARIA SESSION SUMMARY
Course Elapsed Days: 2
Plan Fractions Treated to Date: 3
Plan Prescribed Dose Per Fraction: 1.8 Gy
Plan Total Fractions Prescribed: 25
Plan Total Prescribed Dose: 45 Gy
Reference Point Dosage Given to Date: 5.4 Gy
Reference Point Session Dosage Given: 1.8 Gy
Session Number: 3

## 2023-04-28 MED ORDER — SODIUM CHLORIDE 0.9% FLUSH
10.0000 mL | Freq: Once | INTRAVENOUS | Status: AC
Start: 2023-04-28 — End: 2023-04-28
  Administered 2023-04-28: 10 mL

## 2023-04-28 MED ORDER — HEPARIN SOD (PORK) LOCK FLUSH 100 UNIT/ML IV SOLN
500.0000 [IU] | Freq: Once | INTRAVENOUS | Status: AC
  Administered 2023-04-28: 500 [IU]

## 2023-04-28 NOTE — Progress Notes (Signed)
 Per Dr. Mosetta Putt, called pt to see if she could come back to [facility] for an office visit with Dr. Mosetta Putt, pt declined due to no transportation because husband has went back to work. Pt does not have any NVD, eating and drinking fine, no abdominal pain. Only fatigue and still the back pain. Per Dr. Mosetta Putt, advised to stop Xeloda until she's seen by the provider. Pt verbalized understanding and knows she is to see provider after RT appt on 4/3

## 2023-04-28 NOTE — Telephone Encounter (Signed)
 CRITICAL VALUE STICKER  CRITICAL VALUE: Bili 4.5 AST 191  RECEIVER (on-site recipient of call):  DATE & TIME NOTIFIED: 3086 4/2  MESSENGER (representative from lab): Lonna Duval  MD NOTIFIED: Yes  TIME OF NOTIFICATION: 0935  RESPONSE: Needs to see pt

## 2023-04-29 ENCOUNTER — Other Ambulatory Visit: Payer: Self-pay

## 2023-04-29 ENCOUNTER — Ambulatory Visit (HOSPITAL_COMMUNITY)
Admission: RE | Admit: 2023-04-29 | Discharge: 2023-04-29 | Disposition: A | Discharge status: Home / Self Care | Source: Ambulatory Visit | Attending: Radiation Oncology | Admitting: Radiation Oncology

## 2023-04-29 ENCOUNTER — Ambulatory Visit
Admission: RE | Admit: 2023-04-29 | Discharge: 2023-04-29 | Disposition: A | Discharge status: Home / Self Care | Source: Ambulatory Visit | Attending: Radiation Oncology | Admitting: Radiation Oncology

## 2023-04-29 ENCOUNTER — Ambulatory Visit (HOSPITAL_COMMUNITY)
Admission: RE | Admit: 2023-04-29 | Discharge: 2023-04-29 | Disposition: A | Discharge status: Home / Self Care | Source: Ambulatory Visit | Attending: Nurse Practitioner | Admitting: Nurse Practitioner

## 2023-04-29 ENCOUNTER — Inpatient Hospital Stay (HOSPITAL_BASED_OUTPATIENT_CLINIC_OR_DEPARTMENT_OTHER): Admitting: Nurse Practitioner

## 2023-04-29 ENCOUNTER — Encounter: Payer: Self-pay | Admitting: Nurse Practitioner

## 2023-04-29 ENCOUNTER — Encounter (HOSPITAL_COMMUNITY): Payer: Self-pay

## 2023-04-29 ENCOUNTER — Other Ambulatory Visit (HOSPITAL_COMMUNITY): Payer: Self-pay | Admitting: Radiation Oncology

## 2023-04-29 ENCOUNTER — Inpatient Hospital Stay

## 2023-04-29 VITALS — BP 111/68 | HR 97 | Temp 97.8°F | Resp 16 | Ht 63.0 in | Wt 108.7 lb

## 2023-04-29 DIAGNOSIS — C259 Malignant neoplasm of pancreas, unspecified: Secondary | ICD-10-CM | POA: Diagnosis not present

## 2023-04-29 HISTORY — PX: IR CHOLANGIOGRAM EXISTING TUBE: IMG6040

## 2023-04-29 LAB — RAD ONC ARIA SESSION SUMMARY
Course Elapsed Days: 3
Plan Fractions Treated to Date: 4
Plan Prescribed Dose Per Fraction: 1.8 Gy
Plan Total Fractions Prescribed: 25
Plan Total Prescribed Dose: 45 Gy
Reference Point Dosage Given to Date: 7.2 Gy
Reference Point Session Dosage Given: 1.8 Gy
Session Number: 4

## 2023-04-29 LAB — CANCER ANTIGEN 19-9: CA 19-9: 170 U/mL — ABNORMAL HIGH (ref 0–35)

## 2023-04-29 MED ORDER — IOHEXOL 300 MG/ML  SOLN
50.0000 mL | Freq: Once | INTRAMUSCULAR | Status: AC | PRN
  Administered 2023-04-29: 10 mL

## 2023-04-29 MED ORDER — LIDOCAINE HCL 1 % IJ SOLN
INTRAMUSCULAR | Status: AC
Start: 2023-04-29 — End: ?
  Filled 2023-04-29: qty 20

## 2023-04-29 NOTE — Progress Notes (Signed)
 Patient Care Team: Allwardt, Crist Infante, PA-C as PCP - General (Physician Assistant) Geraldine Contras, MD as Referring Physician (Psychiatry) Malachy Mood, MD as Consulting Physician (Hematology and Oncology) Pickenpack-Cousar, Arty Baumgartner, NP as Nurse Practitioner (Hospice and Palliative Medicine)   CHIEF COMPLAINT: Elevated bilirubin  CURRENT THERAPY: S/p 4 cycles Gemcitabine/Abraxane completed 03/31/23, currently on chemoRT with Xeloda starting 04/26/23  INTERVAL HISTORY Ms. Handrich presents for symptom management. Last seen by Dr. Mosetta Putt 04/21/23. She began chemoRT with Xeloda on 3/31. Routine lab yesterday showed rising LFTs and new hyperbilirubinemia with Tbili 4.5.  She started itching all over when she started Xeloda, but no rash.  Denies dark urine, light stool, has not realized any jaundice. Back pain and abdominal bloating stable at baseline. Denies nausea/vomiting, bowel issues, fever or chills.  The biliary drain continues with stable output, fills the bag daily. Her spouse was helping her to the bathroom 2 nights ago, felt a tug on the drainage tube, but felt that it remained intact. Has some bruising on her back which may have occurred during radiation positioning? Otherwise does not recall a fall or injury.  ROS  All other systems reviewed and negative   Past Medical History:  Diagnosis Date   Alcohol abuse    Anxiety    Chickenpox    Depression    Neuromuscular disorder (HCC)    neuropathy   Pancreatic cancer (HCC)    Substance abuse (HCC)      Past Surgical History:  Procedure Laterality Date   BIOPSY  12/10/2022   Procedure: BIOPSY;  Surgeon: Mansouraty, Netty Starring., MD;  Location: [facility];  Service: Gastroenterology;;   CESAREAN SECTION     DUODENAL STENT PLACEMENT N/A 01/06/2023   Procedure: DUODENAL STENT PLACEMENT;  Surgeon: Lemar Lofty., MD;  Location: [facility];  Service: Gastroenterology;  Laterality: N/A;   ESOPHAGOGASTRODUODENOSCOPY N/A 08/14/2021    Procedure: ESOPHAGOGASTRODUODENOSCOPY (EGD);  Surgeon: Rachael Fee, MD;  Location: [facility];  Service: Gastroenterology;  Laterality: N/A;   ESOPHAGOGASTRODUODENOSCOPY N/A 12/12/2022   Procedure: ESOPHAGOGASTRODUODENOSCOPY (EGD);  Surgeon: Lemar Lofty., MD;  Location: [facility];  Service: Gastroenterology;  Laterality: N/A;   ESOPHAGOGASTRODUODENOSCOPY (EGD) WITH PROPOFOL N/A 12/10/2022   Procedure: ESOPHAGOGASTRODUODENOSCOPY (EGD) WITH PROPOFOL;  Surgeon: Meridee Score Netty Starring., MD;  Location: [facility];  Service: Gastroenterology;  Laterality: N/A;   ESOPHAGOGASTRODUODENOSCOPY (EGD) WITH PROPOFOL N/A 01/06/2023   Procedure: ESOPHAGOGASTRODUODENOSCOPY (EGD) WITH PROPOFOL;  Surgeon: Meridee Score Netty Starring., MD;  Location: [facility];  Service: Gastroenterology;  Laterality: N/A;  with duodenal stent placement   EUS N/A 08/14/2021   Procedure: UPPER ENDOSCOPIC ULTRASOUND (EUS) RADIAL;  Surgeon: Rachael Fee, MD;  Location: [facility];  Service: Gastroenterology;  Laterality: N/A;   EUS N/A 12/12/2022   Procedure: UPPER ENDOSCOPIC ULTRASOUND (EUS) LINEAR;  Surgeon: Lemar Lofty., MD;  Location: [facility];  Service: Gastroenterology;  Laterality: N/A;   EUS N/A 01/06/2023   Procedure: FULL UPPER ENDOSCOPIC ULTRASOUND (EUS) RADIAL;  Surgeon: Meridee Score Netty Starring., MD;  Location: [facility];  Service: Gastroenterology;  Laterality: N/A;   FINE NEEDLE ASPIRATION N/A 08/14/2021   Procedure: FINE NEEDLE ASPIRATION (FNA) LINEAR;  Surgeon: Rachael Fee, MD;  Location: [facility];  Service: Gastroenterology;  Laterality: N/A;   FINE NEEDLE ASPIRATION N/A 12/12/2022   Procedure: FINE NEEDLE ASPIRATION (FNA) LINEAR;  Surgeon: Lemar Lofty., MD;  Location: [facility];  Service: Gastroenterology;  Laterality: N/A;   FINE NEEDLE ASPIRATION  01/06/2023   Procedure: FINE NEEDLE ASPIRATION;  Surgeon: Lemar Lofty., MD;  Location: [facility];  Service: Gastroenterology;;   IR EXCHANGE BILIARY DRAIN  01/29/2023   IR EXCHANGE BILIARY DRAIN  03/05/2023   IR EXCHANGE BILIARY DRAIN  04/16/2023   IR IMAGING GUIDED PORT INSERTION  12/18/2022   IR PERC CHOLECYSTOSTOMY  12/13/2022   TONSILLECTOMY       Outpatient Encounter Medications as of 04/29/2023  Medication Sig Note   acetaminophen (TYLENOL) 500 MG tablet Take 500-1,000 mg by mouth every 6 (six) hours as needed for moderate pain (pain score 4-6).    ELIQUIS 5 MG TABS tablet TAKE 1 TABLET BY MOUTH TWICE A DAY    Ensure Max Protein (ENSURE MAX PROTEIN) LIQD Take 330 mLs (11 oz total) by mouth 2 (two) times daily.    famotidine (PEPCID) 20 MG tablet Take 1 tablet (20 mg total) by mouth 2 (two) times daily.    gabapentin (NEURONTIN) 300 MG capsule Take 2 capsules (600 mg total) by mouth 2 (two) times daily. 04/21/2023: Taking 900mg  at bedtime    lidocaine-prilocaine (EMLA) cream Apply to affected area once    mirtazapine (REMERON) 45 MG tablet Take 1 tablet (45 mg total) by mouth at bedtime.    ondansetron (ZOFRAN) 8 MG tablet Take 1 tablet (8 mg total) by mouth every 8 (eight) hours as needed for nausea or vomiting.    Oxycodone HCl 10 MG TABS Take 1 tablet (10 mg total) by mouth every 4 (four) hours as needed.    potassium chloride (KLOR-CON M) 10 MEQ tablet TAKE 1 TABLET (10 MEQ TOTAL) BY MOUTH DAILY. WHEN YOU TAKE LASIX    prochlorperazine (COMPAZINE) 10 MG tablet Take 1 tablet (10 mg total) by mouth every 6 (six) hours as needed for nausea or vomiting.    QUEtiapine (SEROQUEL) 100 MG tablet Take 2 tablets (200 mg total) by mouth at bedtime.    sodium chloride flush (NS) 0.9 % SOLN Use 5 - 10 mLs to flush abdominal drain once daily as directed    sucralfate (CARAFATE) 1 GM/10ML suspension TAKE 10 MLS (1 G TOTAL) BY MOUTH 4 (FOUR) TIMES DAILY - WITH MEALS AND AT BEDTIME.    Vitamin D, Ergocalciferol, (DRISDOL) 1.25 MG (50000 UNIT) CAPS capsule Take 1 capsule (50,000 Units total)  by mouth every 7 (seven) days.    capecitabine (XELODA) 500 MG tablet Take 2 tablets (1000 mg total) by mouth in morning and 3 tablets (1500 mg total) by mouth in evening, every 10-12 hours. Take within 30 minutes after meals. Take only on days of radiation, Monday through Fridays. (Patient not taking: Reported on 04/29/2023) 04/29/2023: On hold   furosemide (LASIX) 20 MG tablet TAKE 1 TABLET (20 MG TOTAL) BY MOUTH DAILY. FOR 5 DAYS THEN AS NEEDED FOR LEG EDEMA (Patient not taking: Reported on 04/29/2023)    No facility-administered encounter medications on file as of 04/29/2023.     Today's Vitals   04/29/23 1023 04/29/23 1024  BP: 111/68   Pulse: 97   Resp: 16   Temp: 97.8 F (36.6 C)   TempSrc: Temporal   SpO2: 96%   Weight: 108 lb 11.2 oz (49.3 kg)   Height: 5\' 3"  (1.6 m)   PainSc:  5    Body mass index is 19.26 kg/m.   PHYSICAL EXAM GENERAL:alert, no distress and comfortable SKIN: Jaundice, no rash  EYES: scleral icterus LUNGS: clear with normal breathing effort HEART: regular rate & rhythm, trace lower extremity edema ABDOMEN: abdomen soft, distended,  non-tender and normal bowel sounds. Biliary drain in place.  MSK: lumbar area with mild diffuse ecchymosis  NEURO: alert & oriented x 3 with fluent speech PAC without erythema    CBC    Component Value Date/Time   WBC 10.6 (H) 04/28/2023 0827   WBC 7.9 01/29/2023 0855   RBC 3.52 (L) 04/28/2023 0827   HGB 10.7 (L) 04/28/2023 0827   HCT 32.1 (L) 04/28/2023 0827   PLT 409 (H) 04/28/2023 0827   MCV 91.2 04/28/2023 0827   MCH 30.4 04/28/2023 0827   MCHC 33.3 04/28/2023 0827   RDW 22.4 (H) 04/28/2023 0827   LYMPHSABS 1.3 04/28/2023 0827   MONOABS 1.1 (H) 04/28/2023 0827   EOSABS 0.3 04/28/2023 0827   BASOSABS 0.1 04/28/2023 0827     CMP     Component Value Date/Time   NA 137 04/28/2023 0827   K 4.1 04/28/2023 0827   CL 102 04/28/2023 0827   CO2 30 04/28/2023 0827   GLUCOSE 126 (H) 04/28/2023 0827   BUN 16 04/28/2023  0827   CREATININE 0.41 (L) 04/28/2023 0827   CALCIUM 8.9 04/28/2023 0827   PROT 6.2 (L) 04/28/2023 0827   ALBUMIN 3.2 (L) 04/28/2023 0827   AST 191 (HH) 04/28/2023 0827   ALT 154 (H) 04/28/2023 0827   ALKPHOS 973 (H) 04/28/2023 0827   BILITOT 4.5 (HH) 04/28/2023 0827   GFRNONAA >60 04/28/2023 0827   GFRAA >60 09/20/2018 1217     ASSESSMENT & PLAN: 63 yo female    Pancreatic adenocarcinoma, 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  S/p duodenal stent placement on 01/06/2023 -Completed 4 cycles chemo (gemcitabine and abraxane) 01/26/2023 - 03/31/23 -Began consolidation radiation under Dr. Mitzi Hansen, with concurrent chemo Xeloda (BID on M-F with radiation) starting 04/26/23    Disposition: Ms. Groner appears jaundiced, asymptomatic except pruritus. We reviewed the differential which includes medication vs biliary obstruction vs drain malfunction vs other. I doubt this is related to Xeloda after only 2-3 doses. This is on hold for now. She continues radiation.   She is being referred for imaging to evaluate the rising transaminitis and new hyperbilirubinemia. IR, GI, and rad onc have been informed, appreciate their assistance as things transpire.   Prefers to avoid hospital admission, which is reasonable for now given that she is asymptomatic. She understands to call/come to ED if she develops severe pain, N/V, fever, or feeling unwell at home.   Repeat lab 4/7 to determine when to resume Xeloda.   Case  reviewed with Dr. Mosetta Putt who will help coordinate care pending today's work up.  All questions were answered. The patient knows to call the clinic with any problems, questions or concerns. No barriers to learning were detected. I spent 20 minutes counseling the patient face to face. The total time spent in the appointment was 30 minutes and more than 50% was on counseling, review of test results, and coordination of care.   Santiago Glad, NP-C 04/29/2023

## 2023-04-30 ENCOUNTER — Other Ambulatory Visit: Payer: Self-pay | Admitting: *Deleted

## 2023-04-30 ENCOUNTER — Ambulatory Visit
Admission: RE | Admit: 2023-04-30 | Discharge: 2023-04-30 | Disposition: A | Discharge status: Home / Self Care | Source: Ambulatory Visit | Attending: Radiation Oncology | Admitting: Radiation Oncology

## 2023-04-30 ENCOUNTER — Telehealth: Payer: Self-pay | Admitting: *Deleted

## 2023-04-30 ENCOUNTER — Other Ambulatory Visit: Payer: Self-pay

## 2023-04-30 DIAGNOSIS — Z510 Encounter for antineoplastic radiation therapy: Secondary | ICD-10-CM | POA: Diagnosis not present

## 2023-04-30 DIAGNOSIS — C258 Malignant neoplasm of overlapping sites of pancreas: Secondary | ICD-10-CM

## 2023-04-30 DIAGNOSIS — C259 Malignant neoplasm of pancreas, unspecified: Secondary | ICD-10-CM

## 2023-04-30 LAB — RAD ONC ARIA SESSION SUMMARY
Course Elapsed Days: 4
Plan Fractions Treated to Date: 5
Plan Prescribed Dose Per Fraction: 1.8 Gy
Plan Total Fractions Prescribed: 25
Plan Total Prescribed Dose: 45 Gy
Reference Point Dosage Given to Date: 9 Gy
Reference Point Session Dosage Given: 1.8 Gy
Session Number: 5

## 2023-04-30 MED ORDER — SONAFINE EX EMUL
1.0000 | Freq: Once | CUTANEOUS | Status: AC
  Administered 2023-04-30: 1 via TOPICAL

## 2023-04-30 NOTE — Telephone Encounter (Signed)
 Called and notified pt of upcoming appts for port/flush/labs on 4/7 and CT at [facility] on 4/8 at 930 am. Also contacted radiation to see if they could reschedule pt for her appt on that day to an early or later appt. Dr. Mitzi Hansen nurse contacted and will contact pt once schedule has been accommodated. Pt verbalized understanding

## 2023-05-03 ENCOUNTER — Other Ambulatory Visit: Payer: Self-pay

## 2023-05-03 ENCOUNTER — Inpatient Hospital Stay

## 2023-05-03 ENCOUNTER — Ambulatory Visit
Admission: RE | Admit: 2023-05-03 | Discharge: 2023-05-03 | Disposition: A | Discharge status: Home / Self Care | Source: Ambulatory Visit | Attending: Radiation Oncology

## 2023-05-03 DIAGNOSIS — E860 Dehydration: Secondary | ICD-10-CM

## 2023-05-03 DIAGNOSIS — C259 Malignant neoplasm of pancreas, unspecified: Secondary | ICD-10-CM

## 2023-05-03 DIAGNOSIS — Z510 Encounter for antineoplastic radiation therapy: Secondary | ICD-10-CM | POA: Diagnosis not present

## 2023-05-03 LAB — CMP (CANCER CENTER ONLY)
ALT: 165 U/L — ABNORMAL HIGH (ref 0–44)
AST: 177 U/L (ref 15–41)
Albumin: 3.4 g/dL — ABNORMAL LOW (ref 3.5–5.0)
Alkaline Phosphatase: 833 U/L — ABNORMAL HIGH (ref 38–126)
Anion gap: 5 (ref 5–15)
BUN: 10 mg/dL (ref 8–23)
CO2: 29 mmol/L (ref 22–32)
Calcium: 9 mg/dL (ref 8.9–10.3)
Chloride: 101 mmol/L (ref 98–111)
Creatinine: 0.39 mg/dL — ABNORMAL LOW (ref 0.44–1.00)
GFR, Estimated: >60 mL/min (ref >60)
Glucose, Bld: 147 mg/dL — ABNORMAL HIGH (ref 70–99)
Potassium: 3.8 mmol/L (ref 3.5–5.1)
Sodium: 135 mmol/L (ref 135–145)
Total Bilirubin: 8.9 mg/dL (ref 0.0–1.2)
Total Protein: 6.4 g/dL — ABNORMAL LOW (ref 6.5–8.1)

## 2023-05-03 LAB — RAD ONC ARIA SESSION SUMMARY
Course Elapsed Days: 7
Plan Fractions Treated to Date: 6
Plan Prescribed Dose Per Fraction: 1.8 Gy
Plan Total Fractions Prescribed: 25
Plan Total Prescribed Dose: 45 Gy
Reference Point Dosage Given to Date: 10.8 Gy
Reference Point Session Dosage Given: 1.8 Gy
Session Number: 6

## 2023-05-03 MED ORDER — HEPARIN SOD (PORK) LOCK FLUSH 100 UNIT/ML IV SOLN
500.0000 [IU] | Freq: Once | INTRAVENOUS | Status: AC
  Administered 2023-05-03: 500 [IU]

## 2023-05-03 MED ORDER — SODIUM CHLORIDE 0.9% FLUSH
10.0000 mL | Freq: Once | INTRAVENOUS | Status: AC | PRN
  Administered 2023-05-03: 10 mL

## 2023-05-03 NOTE — Patient Instructions (Signed)

## 2023-05-03 NOTE — Assessment & Plan Note (Signed)
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 -She was readmitted to hospital for recurrent nausea and vomiting, likely partial gastric outlet obstruction from pancreatic tumor.  She is able to tolerate full liquid diet, she underwet duodenal stent placement on 01/06/2023 -she started chemo gemcitabine and abraxane on 01/26/2023  -her scan was reviewed by Dr. Freida Busman and her cancer was deemed to be unresectable, this is confirmed by Dr. Kathrynn Ducking at [facility]  -She started concurrent chemoradiation with Xeloda on April 26, 2023 -she developed jaundice on 04/28/2023

## 2023-05-04 ENCOUNTER — Other Ambulatory Visit: Payer: Self-pay

## 2023-05-04 ENCOUNTER — Inpatient Hospital Stay (HOSPITAL_BASED_OUTPATIENT_CLINIC_OR_DEPARTMENT_OTHER): Admitting: Hematology

## 2023-05-04 ENCOUNTER — Encounter: Payer: Self-pay | Admitting: Hematology

## 2023-05-04 ENCOUNTER — Ambulatory Visit
Admission: RE | Admit: 2023-05-04 | Discharge: 2023-05-04 | Disposition: A | Discharge status: Home / Self Care | Source: Ambulatory Visit | Attending: Radiation Oncology

## 2023-05-04 ENCOUNTER — Ambulatory Visit (HOSPITAL_BASED_OUTPATIENT_CLINIC_OR_DEPARTMENT_OTHER)
Admission: RE | Admit: 2023-05-04 | Discharge: 2023-05-04 | Disposition: A | Discharge status: Home / Self Care | Source: Ambulatory Visit | Attending: Hematology | Admitting: Hematology

## 2023-05-04 VITALS — BP 115/65 | HR 87 | Temp 98.2°F | Resp 16 | Ht 63.0 in | Wt 109.5 lb

## 2023-05-04 DIAGNOSIS — C259 Malignant neoplasm of pancreas, unspecified: Secondary | ICD-10-CM | POA: Diagnosis not present

## 2023-05-04 DIAGNOSIS — R17 Unspecified jaundice: Secondary | ICD-10-CM | POA: Diagnosis not present

## 2023-05-04 DIAGNOSIS — Z510 Encounter for antineoplastic radiation therapy: Secondary | ICD-10-CM | POA: Diagnosis not present

## 2023-05-04 LAB — RAD ONC ARIA SESSION SUMMARY
Course Elapsed Days: 8
Plan Fractions Treated to Date: 7
Plan Prescribed Dose Per Fraction: 1.8 Gy
Plan Total Fractions Prescribed: 25
Plan Total Prescribed Dose: 45 Gy
Reference Point Dosage Given to Date: 12.6 Gy
Reference Point Session Dosage Given: 1.8 Gy
Session Number: 7

## 2023-05-04 MED ORDER — HEPARIN SOD (PORK) LOCK FLUSH 100 UNIT/ML IV SOLN
500.0000 [IU] | Freq: Once | INTRAVENOUS | Status: AC
Start: 2023-05-04 — End: 2023-05-04
  Administered 2023-05-04: 500 [IU] via INTRAVENOUS

## 2023-05-04 MED ORDER — IOHEXOL 300 MG/ML  SOLN
100.0000 mL | Freq: Once | INTRAMUSCULAR | Status: AC | PRN
  Administered 2023-05-04: 75 mL via INTRAVENOUS

## 2023-05-04 NOTE — Progress Notes (Deleted)
 Patient Care Team: Allwardt, Crist Infante, PA-C as PCP - General (Physician Assistant) Geraldine Contras, MD as Referring Physician (Psychiatry) Malachy Mood, MD as Consulting Physician (Hematology and Oncology) Pickenpack-Cousar, Arty Baumgartner, NP as Nurse Practitioner [facility])  Clinic Day:  05/04/2023  Referring physician: Malachy Mood, MD  ASSESSMENT & PLAN:   Assessment & Plan: No problem-specific Assessment & Plan notes found for this encounter.    The patient understands the plans discussed today and is in agreement with them.  Kathryn Underwood knows to contact our office if Kathryn Underwood develops concerns prior to her next appointment.  I provided *** minutes of face-to-face time during this encounter and > 50% was spent counseling as documented under my assessment and plan.    Kathryn Jews, NP  [facility] - A DEPT OF [facility] [address] [place] [address] Dept: [phone] Dept Fax: [phone]   No orders of the defined types were placed in this encounter.     CHIEF COMPLAINT:  CC: pancreatic cancer   Current Treatment:  chemoradiation with xeloda - started 04/26/2023. Completed 4 cycles of gemcitabine and abraxane 03/31/2023.   INTERVAL HISTORY:  Kathryn Underwood is here today for repeat clinical assessment. Was seen /08/2023 per Dr. Mosetta Putt. Previous to that, Kathryn Underwood was seen 04/29/2023 due to elevated bilirubin levels. Was not having new or worsening symptoms at the time. Her biliary drainage output was stable. Kathryn Underwood was jaundiced at appointment on 04/29/2023.  CT CAP done 05/04/2023 showed similar moderate intra and extrahepatic biliary duct dilation to that seen on CT 03/16/2023. Percutaneous biliary drain in position, though more superficial than on prior imaging studies. There was no ascites. Duodenal stent appeared patent and unchanged. No peritoneal masses were identified. Kathryn Underwood denies fevers or chills. Kathryn Underwood denies pain. Her  appetite is good. Her weight {Weight change:10426}.  I have reviewed the past medical history, past surgical history, social history and family history with the patient and they are unchanged from previous note.  ALLERGIES:  has no known allergies.  MEDICATIONS:  Current Outpatient Medications  Medication Sig Dispense Refill   acetaminophen (TYLENOL) 500 MG tablet Take 500-1,000 mg by mouth every 6 (six) hours as needed for moderate pain (pain score 4-6).     capecitabine (XELODA) 500 MG tablet Take 2 tablets (1000 mg total) by mouth in morning and 3 tablets (1500 mg total) by mouth in evening, every 10-12 hours. Take within 30 minutes after meals. Take only on days of radiation, Monday through Fridays. (Patient not taking: Reported on 04/29/2023) 75 tablet 1   ELIQUIS 5 MG TABS tablet TAKE 1 TABLET BY MOUTH TWICE A DAY 60 tablet 1   Ensure Max Protein (ENSURE MAX PROTEIN) LIQD Take 330 mLs (11 oz total) by mouth 2 (two) times daily.     famotidine (PEPCID) 20 MG tablet Take 1 tablet (20 mg total) by mouth 2 (two) times daily. 60 tablet 3   furosemide (LASIX) 20 MG tablet TAKE 1 TABLET (20 MG TOTAL) BY MOUTH DAILY. FOR 5 DAYS THEN AS NEEDED FOR LEG EDEMA (Patient not taking: Reported on 04/29/2023) 20 tablet 0   gabapentin (NEURONTIN) 300 MG capsule Take 2 capsules (600 mg total) by mouth 2 (two) times daily. 120 capsule 3   lidocaine-prilocaine (EMLA) cream Apply to affected area once 30 g 3   mirtazapine (REMERON) 45 MG tablet Take 1 tablet (45 mg total) by mouth at bedtime. 30  tablet 3   ondansetron (ZOFRAN) 8 MG tablet Take 1 tablet (8 mg total) by mouth every 8 (eight) hours as needed for nausea or vomiting. 30 tablet 1   Oxycodone HCl 10 MG TABS Take 1 tablet (10 mg total) by mouth every 4 (four) hours as needed. 90 tablet 0   potassium chloride (KLOR-CON M) 10 MEQ tablet TAKE 1 TABLET (10 MEQ TOTAL) BY MOUTH DAILY. WHEN YOU TAKE LASIX 20 tablet 0   prochlorperazine (COMPAZINE) 10 MG tablet  Take 1 tablet (10 mg total) by mouth every 6 (six) hours as needed for nausea or vomiting. 30 tablet 1   QUEtiapine (SEROQUEL) 100 MG tablet Take 2 tablets (200 mg total) by mouth at bedtime. 30 tablet 3   sodium chloride flush (NS) 0.9 % SOLN Use 5 - 10 mLs to flush abdominal drain once daily as directed 300 mL 3   sucralfate (CARAFATE) 1 GM/10ML suspension TAKE 10 MLS (1 G TOTAL) BY MOUTH 4 (FOUR) TIMES DAILY - WITH MEALS AND AT BEDTIME. 420 mL 1   Vitamin D, Ergocalciferol, (DRISDOL) 1.25 MG (50000 UNIT) CAPS capsule Take 1 capsule (50,000 Units total) by mouth every 7 (seven) days. 5 capsule 6   No current facility-administered medications for this visit.    HISTORY OF PRESENT ILLNESS:   Oncology History  Pancreatic adenocarcinoma (HCC)  12/12/2022 Cancer Staging   Staging form: Exocrine Pancreas, AJCC 8th Edition - Clinical stage from 12/12/2022: Stage IIA (cT3, cN0, cM0) - Signed by Malachy Mood, MD on 12/28/2022 Total positive nodes: 0   12/16/2022 Initial Diagnosis   Pancreatic adenocarcinoma (HCC)   01/26/2023 -  Chemotherapy   Patient is on Treatment Plan : PANCREATIC Abraxane D1,8 + Gemcitabine D1,8 q21d      Genetic Testing   Negative 76 gene [facility] CancerNext-Expanded +RNAinsight panel. The CancerNext-Expanded gene panel offered by [facility] and includes sequencing, rearrangement, and RNA analysis for the following 76 genes: AIP, ALK, APC, ATM, AXIN2, BAP1, BARD1, BMPR1A, BRCA1, BRCA2, BRIP1, CDC73, CDH1, CDK4, CDKN1B, CDKN2A, CEBPA, CHEK2, CTNNA1, DDX41, DICER1, ETV6, FH, FLCN, GATA2, LZTR1, MAX, MBD4, MEN1, MET, MLH1, MSH2, MSH3, MSH6, MUTYH, NF1, NF2, NTHL1, PALB2, PHOX2B, PMS2, POT1, PRKAR1A, PTCH1, PTEN, RAD51C, RAD51D, RB1, RET, RUNX1, SDHA, SDHAF2, SDHB, SDHC, SDHD, SMAD4, SMARCA4, SMARCB1, SMARCE1, STK11, SUFU, TMEM127, TP53, TSC1, TSC2, VHL, and WT1 (sequencing and deletion/duplication); EGFR, HOXB13, KIT, MITF, PDGFRA, POLD1, and POLE (sequencing only); EPCAM and GREM1  (deletion/duplication only). Report date 03/19/23.    03/16/2023 Imaging   CT CAP with pancreatic protocol and CTA imaging - done [facility] Impression:  1. Local advanced infiltrative mass centered within the pancreatic head with extensive vascular involvement, as above.   2. A 7 mm left upper lobe pulmonary nodule is indeterminate.  3. Moderate biliary dilation with obstruction of the level of the extra hepatic bile ducts and possibly owing to obstruction from the pancreatic head mass and/or inflammatory stricture.   4. Multiple pancreatic collections, largest anterior to the pancreatic body, possibly related to prior pancreatitis.  5. Ovoid nodule within the superior right breast is indeterminant. Consider further evaluation with dedicated mammogram as clinically appropriate for this patient.         REVIEW OF SYSTEMS:   Constitutional: Denies fevers, chills or abnormal weight loss Eyes: Denies blurriness of vision Ears, nose, mouth, throat, and face: Denies mucositis or sore throat Respiratory: Denies cough, dyspnea or wheezes Cardiovascular: Denies palpitation, chest discomfort or lower extremity swelling Gastrointestinal:  Denies nausea,  heartburn or change in bowel habits Skin: Denies abnormal skin rashes Lymphatics: Denies new lymphadenopathy or easy bruising Neurological:Denies numbness, tingling or new weaknesses Behavioral/Psych: Mood is stable, no new changes  All other systems were reviewed with the patient and are negative.   VITALS:  There were no vitals taken for this visit.  Wt Readings from Last 3 Encounters:  05/04/23 109 lb 8 oz (49.7 kg)  04/29/23 108 lb 11.2 oz (49.3 kg)  04/21/23 110 lb 11.2 oz (50.2 kg)    There is no height or weight on file to calculate BMI.  Performance status (ECOG): {CHL ONC [id number]  PHYSICAL EXAM:   GENERAL:alert, no distress and comfortable SKIN: skin color, texture, turgor are normal, no rashes or  significant lesions EYES: normal, Conjunctiva are pink and non-injected, sclera clear OROPHARYNX:no exudate, no erythema and lips, buccal mucosa, and tongue normal  NECK: supple, thyroid normal size, non-tender, without nodularity LYMPH:  no palpable lymphadenopathy in the cervical, axillary or inguinal LUNGS: clear to auscultation and percussion with normal breathing effort HEART: regular rate & rhythm and no murmurs and no lower extremity edema ABDOMEN:abdomen soft, non-tender and normal bowel sounds Musculoskeletal:no cyanosis of digits and no clubbing  NEURO: alert & oriented x 3 with fluent speech, no focal motor/sensory deficits  LABORATORY DATA:  I have reviewed the data as listed    Component Value Date/Time   NA 135 05/03/2023 1000   K 3.8 05/03/2023 1000   CL 101 05/03/2023 1000   CO2 29 05/03/2023 1000   GLUCOSE 147 (H) 05/03/2023 1000   BUN 10 05/03/2023 1000   CREATININE 0.39 (L) 05/03/2023 1000   CALCIUM 9.0 05/03/2023 1000   PROT 6.4 (L) 05/03/2023 1000   ALBUMIN 3.4 (L) 05/03/2023 1000   AST 177 (HH) 05/03/2023 1000   ALT 165 (H) 05/03/2023 1000   ALKPHOS 833 (H) 05/03/2023 1000   BILITOT 8.9 (HH) 05/03/2023 1000   GFRNONAA >60 05/03/2023 1000   GFRAA >60 09/20/2018 1217    No results found for: "SPEP", "UPEP"  Lab Results  Component Value Date   WBC 10.6 (H) 04/28/2023   NEUTROABS 7.7 04/28/2023   HGB 10.7 (L) 04/28/2023   HCT 32.1 (L) 04/28/2023   MCV 91.2 04/28/2023   PLT 409 (H) 04/28/2023      Chemistry      Component Value Date/Time   NA 135 05/03/2023 1000   K 3.8 05/03/2023 1000   CL 101 05/03/2023 1000   CO2 29 05/03/2023 1000   BUN 10 05/03/2023 1000   CREATININE 0.39 (L) 05/03/2023 1000      Component Value Date/Time   CALCIUM 9.0 05/03/2023 1000   ALKPHOS 833 (H) 05/03/2023 1000   AST 177 (HH) 05/03/2023 1000   ALT 165 (H) 05/03/2023 1000   BILITOT 8.9 (HH) 05/03/2023 1000       RADIOGRAPHIC STUDIES: I have personally  reviewed the radiological images as listed and agreed with the findings in the report. CT CHEST ABDOMEN PELVIS W CONTRAST Result Date: 05/04/2023 CLINICAL DATA:  Pancreatic cancer. Chemotherapy and radiation therapy ongoing. Duodenal stent. Biliary drain. * Tracking Code: BO * EXAM: CT CHEST, ABDOMEN, AND PELVIS WITH CONTRAST TECHNIQUE: Multidetector CT imaging of the chest, abdomen and pelvis was performed following the standard protocol during bolus administration of intravenous contrast. RADIATION DOSE REDUCTION: This exam was performed according to the departmental dose-optimization program which includes automated exposure control, adjustment of the mA and/or kV according to patient size and/or use of  iterative reconstruction technique. CONTRAST:  75 mL Omnipaque COMPARISON:  Outside CT 03/16/2023 FINDINGS: CT CHEST FINDINGS Cardiovascular: Port in the anterior chest wall with tip in distal SVC. No significant vascular findings. Normal heart size. No pericardial effusion. Mediastinum/Nodes: No axillary or supraclavicular adenopathy. No mediastinal or hilar adenopathy. No pericardial fluid. Esophagus normal. Lungs/Pleura: Irregular nodule in the LEFT upper lobe measures 7 mm (image 31/3) compared to 6 mm on CT 12/17/2022. The nodule appears slightly more thickened. No new pulmonary nodules. No pleural fluid. Musculoskeletal: No aggressive osseous lesion. CT ABDOMEN AND PELVIS FINDINGS Hepatobiliary: There is moderate intra and extra extrahepatic biliary duct dilatation which is similar to CT 03/16/2023. Common hepatic duct measures 15 mm (62/2) compared to 14 mm. Intrahepatic duct on the LEFT measures 11 mm (image 54/2) compared to 11 mm. Common bile duct measures 9 mm similar prior. Biliary drain is position more superficial than comparison exam. The pigtail is coiled along the capsule of the RIGHT hepatic lobe where previously coil was positioned more centrally in the gallbladder fossa. Pancreas: Fluid  collection in the body of the pancreas measures 4.63.6 cm decreased from 5.6 x 4.4 cm. Pancreatic mass is difficult to define. Spleen: Normal spleen Adrenals/urinary tract: Adrenal glands and kidneys are normal. The ureters and bladder normal. Stomach/Bowel: Stomach contains oral contrast. Duodenal stent is noted. Contrast flows through the stent into the small bowel. No obstruction. Colon is normal. Vascular/Lymphatic: Abdominal aorta is normal caliber with atherosclerotic calcification. There is no retroperitoneal or periportal lymphadenopathy. No pelvic lymphadenopathy. Reproductive: Fibroid uterus. Other: No intraperitoneal free fluid.  No peritoneal nodularity. Musculoskeletal: No aggressive osseous lesion. IMPRESSION: 1. Moderate intra and extrahepatic biliary duct dilatation similar to comparison CT 03/16/2023. 2. Percutaneous biliary drain is position more superficial than comparison exam along the capsule the liver. 3. No ascites. 4. Duodenal stent appears patent and unchanged. 5. Fluid collection along the body the pancreas is decreased in volume. Pancreatic mass poorly defined. 6. No peritoneal metastasis identified. 7. Irregular nodule in the LEFT upper lobe is stable. Recommend attention on follow-up. Electronically Signed   By: Genevive Bi M.D.   On: 05/04/2023 11:58   IR CHOLANGIOGRAM EXISTING TUBE Result Date: 04/29/2023 CLINICAL DATA:  increasing LFT's Receipt, 63 year old female with a history of pancreatic adenocarcinoma with biliary obstruction. Percutaneous cholecystostomy placed 12/13/2022, most recently exchanged 04/16/2023. Recent tug, with concern for catheter dislodgement. EXAM: CHOLANGIOGRAM VIA EXISTING CATHETER COMPARISON:  IR fluoroscopy, 04/16/2023.  CT AP, 03/16/2023. CONTRAST:  10 mL Omnipaque 300-administered via the existing percutaneous drain. FLUOROSCOPY TIME:  Fluoroscopic dose; 1 mGy TECHNIQUE: The patient was positioned supine on the fluoroscopy table. A preprocedural  spot fluoroscopic image was obtained of the RIGHT upper quadrant and the existing percutaneous drainage catheter. Multiple spot fluoroscopic and radiographic images were obtained following the injection of a small amount of contrast via the existing percutaneous drainage catheter. FINDINGS: Stable positioning of RIGHT upper quadrant percutaneous cholecystostomy tube. Contrast injection demonstrated a normal functioning with opacification of the decompressed gallbladder. IMPRESSION: Stable positioning of well-functioning percutaneous cholecystostomy tube. RECOMMENDATIONS: The patient will return to Vascular Interventional Radiology (VIR) for routine drainage catheter evaluation and exchange scheduled for 05/28/2023. Roanna Banning, MD [facility] Electronically Signed   By: Roanna Banning M.D.   On: 04/29/2023 17:18   IR EXCHANGE BILIARY DRAIN Result Date: 04/16/2023 INDICATION: 63 year old female presents for routine percutaneous cholecystostomy exchange EXAM: IMAGE GUIDED PERCUTANEOUS CHOLECYSTOSTOMY EXCHANGE MEDICATIONS: None ANESTHESIA/SEDATION: None FLUOROSCOPY: Radiation Exposure Index (  as provided by the fluoroscopic device): 4 mGy Kerma COMPLICATIONS: None PROCEDURE: Informed written consent was obtained from the patient after a thorough discussion of the procedural risks, benefits and alternatives. All questions were addressed. Maximal Sterile Barrier Technique was utilized including caps, mask, sterile gowns, sterile gloves, sterile drape, hand hygiene and skin antiseptic. A timeout was performed prior to the initiation of the procedure. Patient was position under the image intensifier. The drain in the upper abdomen were prepped and draped in the usual sterile fashion. 1% lidocaine was used for local anesthesia. Initial images were performed. Modified Seldinger technique was then used to exchange for a new 10 [place] percutaneous cholecystostomy.  Contrast was injected confirming location. Drain sutured in position and attached to gravity drainage. Patient tolerated the procedure well and remained hemodynamically stable throughout. No complications were encountered and no significant blood loss IMPRESSION: Status post image guided exchange of percutaneous cholecystostomy Signed, Yvone Neu. Miachel Roux, RPVI [facility] Rad[facility] Electronically Signed   By: Gilmer Mor D.O.   On: 04/16/2023 16:44

## 2023-05-04 NOTE — Progress Notes (Signed)
 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 -She was readmitted to hospital for recurrent nausea and vomiting, likely partial gastric outlet obstruction from pancreatic tumor.  She is able to tolerate full liquid diet, she underwet duodenal stent placement on 01/06/2023 -she started chemo gemcitabine and abraxane on 01/26/2023  -her scan was reviewed by Dr. Freida Busman and her cancer was deemed to be unresectable, this is confirmed by Dr. Kathrynn Ducking at [facility]  -She started concurrent chemoradiation with Xeloda on April 26, 2023 -she developed jaundice on 04/28/2023  Assessment & Plan Pancreatic cancer She has pancreatic cancer with no new metastases in the liver or peritoneum. The bile duct is dilated but unchanged, and the drainage tube is well-positioned. The stent in the adrenal is patent. Jaundice is likely cancer-related without clear obstruction. Radiation therapy is ongoing with side effects of itching and fatigue. - Continue radiation therapy as scheduled. - Consult Dr. Craige Cotta for further evaluation, potentially involving endoscopy or additional scans. - Discontinue Tylenol due to abnormal liver function. - Monitor liver  function and adjust pain management as needed.  Jaundice Jaundice is likely related to pancreatic cancer, causing itching. The CT scan shows stable biliary dilatation in the liver, and drainage is functioning well, the duodenal stent is also open. -I reached out to her GI Dr. Meridee Score to get his thoughts  - Discontinue Tylenol due to abnormal liver function. - Use wound care cream for itching relief. - Monitor liver function and symptoms.  Pain management She experiences pain managed with oxycodone and Tylenol. Due to abnormal liver function, Tylenol is contraindicated. She takes oxycodone every four hours, with a five-day supply remaining. Consultation with Lowella Bandy is needed for potential pain management adjustments based on liver function. - Discontinue Tylenol due to abnormal liver function. - Continue oxycodone for pain management, with potential adjustment based on liver function. - Consult with [facility] regarding pain management adjustments based on liver function.  Plan -CT scan reviewed, which showed stable biliary dilatation in the liver, no new metastasis -I sent a message to her GI Dr. Meridee Score to get his thoughts about the jaundice -Repeat lab tomorrow -Continue to hold Xeloda, continue radiation. -Lab tomorrow to rule out hemolysis.     SUMMARY OF ONCOLOGIC HISTORY: Oncology History  Pancreatic adenocarcinoma (HCC)  12/12/2022 Cancer Staging   Staging form: Exocrine Pancreas, AJCC 8th Edition - Clinical stage from 12/12/2022: Stage IIA (cT3, cN0, cM0) - Signed by Malachy Mood, MD on 12/28/2022 Total positive nodes: 0   12/16/2022 Initial Diagnosis   Pancreatic adenocarcinoma (HCC)   01/26/2023 -  Chemotherapy   Patient is on Treatment Plan : PANCREATIC Abraxane D1,8 + Gemcitabine D1,8 q21d      Genetic Testing   Negative 76 gene [employer] CancerNext-Expanded +RNAinsight panel. The CancerNext-Expanded gene panel offered by Karna Dupes and includes  sequencing,  rearrangement, and RNA analysis for the following 76 genes: AIP, ALK, APC, ATM, AXIN2, BAP1, BARD1, BMPR1A, BRCA1, BRCA2, BRIP1, CDC73, CDH1, CDK4, CDKN1B, CDKN2A, CEBPA, CHEK2, CTNNA1, DDX41, DICER1, ETV6, FH, FLCN, GATA2, LZTR1, MAX, MBD4, MEN1, MET, MLH1, MSH2, MSH3, MSH6, MUTYH, NF1, NF2, NTHL1, PALB2, PHOX2B, PMS2, POT1, PRKAR1A, PTCH1, PTEN, RAD51C, RAD51D, RB1, RET, RUNX1, SDHA, SDHAF2, SDHB, SDHC, SDHD, SMAD4, SMARCA4, SMARCB1, SMARCE1, STK11, SUFU, TMEM127, TP53, TSC1, TSC2, VHL, and WT1 (sequencing and deletion/duplication); EGFR, HOXB13, KIT, MITF, PDGFRA, POLD1, and POLE (sequencing only); EPCAM and GREM1 (deletion/duplication only). Report date 03/19/23.    03/16/2023 Imaging   CT CAP with pancreatic protocol and CTA imaging - done [facility] Impression:  1. Local advanced infiltrative mass centered within the pancreatic head with extensive vascular involvement, as above.   2. A 7 mm left upper lobe pulmonary nodule is indeterminate.  3. Moderate biliary dilation with obstruction of the level of the extra hepatic bile ducts and possibly owing to obstruction from the pancreatic head mass and/or inflammatory stricture.   4. Multiple pancreatic collections, largest anterior to the pancreatic body, possibly related to prior pancreatitis.  5. Ovoid nodule within the superior right breast is indeterminant. Consider further evaluation with dedicated mammogram as clinically appropriate for this patient.        Discussed the use of AI scribe software for clinical note transcription with the patient, who gave verbal consent to proceed.  History of Present Illness The patient, with a history of pancreatic cancer, presents with jaundice. She reports feeling 'a little better' on some days, with the worst days characterized by itching all over her body, which she initially attributed to radiation therapy. She has been using a wound care cream for relief. She is currently undergoing daily  radiation therapy and reports no significant side effects such as diarrhea. She also reports a feeling of bloating, but denies any abdominal pain.  The patient has also noticed a decrease in hearing in her right ear, accompanied by a ringing sound. She denies any changes in vision. She has been managing pain with oxycodone and occasionally Tylenol, though not daily.     All other systems were reviewed with the patient and are negative.  MEDICAL HISTORY:  Past Medical History:  Diagnosis Date   Alcohol abuse    Anxiety    Chickenpox    Depression    Neuromuscular disorder (HCC)    neuropathy   Pancreatic cancer (HCC)    Substance abuse (HCC)     SURGICAL HISTORY: Past Surgical History:  Procedure Laterality Date   BIOPSY  12/10/2022   Procedure: BIOPSY;  Surgeon: Mansouraty, Netty Starring., MD;  Location: [facility];  Service: Gastroenterology;;   CESAREAN SECTION     DUODENAL STENT PLACEMENT N/A 01/06/2023   Procedure: DUODENAL STENT PLACEMENT;  Surgeon: Lemar Lofty., MD;  Location: [facility];  Service: Gastroenterology;  Laterality: N/A;   ESOPHAGOGASTRODUODENOSCOPY N/A 08/14/2021   Procedure: ESOPHAGOGASTRODUODENOSCOPY (EGD);  Surgeon: Rachael Fee, MD;  Location: [facility];  Service: Gastroenterology;  Laterality: N/A;   ESOPHAGOGASTRODUODENOSCOPY N/A 12/12/2022   Procedure: ESOPHAGOGASTRODUODENOSCOPY (EGD);  Surgeon: Lemar Lofty., MD;  Location: [facility];  Service: Gastroenterology;  Laterality: N/A;   ESOPHAGOGASTRODUODENOSCOPY (EGD) WITH PROPOFOL N/A 12/10/2022   Procedure: ESOPHAGOGASTRODUODENOSCOPY (EGD) WITH PROPOFOL;  Surgeon: Meridee Score Netty Starring., MD;  Location: [facility];  Service: Gastroenterology;  Laterality: N/A;   ESOPHAGOGASTRODUODENOSCOPY (EGD) WITH PROPOFOL N/A 01/06/2023   Procedure: ESOPHAGOGASTRODUODENOSCOPY (EGD) WITH PROPOFOL;  Surgeon: Meridee Score,  Netty Starring., MD;  Location: [facility];  Service: Gastroenterology;   Laterality: N/A;  with duodenal stent placement   EUS N/A 08/14/2021   Procedure: UPPER ENDOSCOPIC ULTRASOUND (EUS) RADIAL;  Surgeon: Rachael Fee, MD;  Location: [facility];  Service: Gastroenterology;  Laterality: N/A;   EUS N/A 12/12/2022   Procedure: UPPER ENDOSCOPIC ULTRASOUND (EUS) LINEAR;  Surgeon: Lemar Lofty., MD;  Location: [facility];  Service: Gastroenterology;  Laterality: N/A;   EUS N/A 01/06/2023   Procedure: FULL UPPER ENDOSCOPIC ULTRASOUND (EUS) RADIAL;  Surgeon: Meridee Score Netty Starring., MD;  Location: [facility];  Service: Gastroenterology;  Laterality: N/A;   FINE NEEDLE ASPIRATION N/A 08/14/2021   Procedure: FINE NEEDLE ASPIRATION (FNA) LINEAR;  Surgeon: Rachael Fee, MD;  Location: [facility];  Service: Gastroenterology;  Laterality: N/A;   FINE NEEDLE ASPIRATION N/A 12/12/2022   Procedure: FINE NEEDLE ASPIRATION (FNA) LINEAR;  Surgeon: Lemar Lofty., MD;  Location: [facility];  Service: Gastroenterology;  Laterality: N/A;   FINE NEEDLE ASPIRATION  01/06/2023   Procedure: FINE NEEDLE ASPIRATION;  Surgeon: Lemar Lofty., MD;  Location: [facility];  Service: Gastroenterology;;   IR CHOLANGIOGRAM EXISTING TUBE  04/29/2023   IR EXCHANGE BILIARY DRAIN  01/29/2023   IR EXCHANGE BILIARY DRAIN  03/05/2023   IR EXCHANGE BILIARY DRAIN  04/16/2023   IR IMAGING GUIDED PORT INSERTION  12/18/2022   IR PERC CHOLECYSTOSTOMY  12/13/2022   TONSILLECTOMY      I have reviewed the social history and family history with the patient and they are unchanged from previous note.  ALLERGIES:  has no known allergies.  MEDICATIONS:  Current Outpatient Medications  Medication Sig Dispense Refill   ELIQUIS 5 MG TABS tablet TAKE 1 TABLET BY MOUTH TWICE A DAY 60 tablet 1   Ensure Max Protein (ENSURE MAX PROTEIN) LIQD Take 330 mLs (11 oz total) by mouth 2 (two) times daily.     famotidine (PEPCID) 20 MG tablet Take 1 tablet (20 mg total) by mouth 2 (two)  times daily. 60 tablet 3   furosemide (LASIX) 20 MG tablet TAKE 1 TABLET (20 MG TOTAL) BY MOUTH DAILY. FOR 5 DAYS THEN AS NEEDED FOR LEG EDEMA (Patient not taking: Reported on 04/29/2023) 20 tablet 0   gabapentin (NEURONTIN) 300 MG capsule Take 2 capsules (600 mg total) by mouth 2 (two) times daily. 120 capsule 3   lidocaine-prilocaine (EMLA) cream Apply to affected area once 30 g 3   mirtazapine (REMERON) 45 MG tablet Take 1 tablet (45 mg total) by mouth at bedtime. 30 tablet 3   ondansetron (ZOFRAN) 8 MG tablet Take 1 tablet (8 mg total) by mouth every 8 (eight) hours as needed for nausea or vomiting. 30 tablet 1   Oxycodone HCl 10 MG TABS Take 1 tablet (10 mg total) by mouth every 4 (four) hours as needed. 90 tablet 0   potassium chloride (KLOR-CON M) 10 MEQ tablet TAKE 1 TABLET (10 MEQ TOTAL) BY MOUTH DAILY. WHEN YOU TAKE LASIX 20 tablet 0   prochlorperazine (COMPAZINE) 10 MG tablet Take 1 tablet (10 mg total) by mouth every 6 (six) hours as needed for nausea or vomiting. 30 tablet 1   QUEtiapine (SEROQUEL) 100 MG tablet Take 2 tablets (200 mg total) by mouth at bedtime. 30 tablet 3   sodium chloride flush (NS) 0.9 % SOLN Use 5 - 10 mLs to flush abdominal drain once daily as directed 300 mL 3   sucralfate (CARAFATE) 1 GM/10ML suspension TAKE 10  MLS (1 G TOTAL) BY MOUTH 4 (FOUR) TIMES DAILY - WITH MEALS AND AT BEDTIME. 420 mL 1   Vitamin D, Ergocalciferol, (DRISDOL) 1.25 MG (50000 UNIT) CAPS capsule Take 1 capsule (50,000 Units total) by mouth every 7 (seven) days. 5 capsule 6   No current facility-administered medications for this visit.    PHYSICAL EXAMINATION: ECOG PERFORMANCE STATUS: 2 - Symptomatic, <50% confined to bed  Vitals:   05/04/23 1507  BP: 115/65  Pulse: 87  Resp: 16  Temp: 98.2 F (36.8 C)  SpO2: 99%   Wt Readings from Last 3 Encounters:  05/04/23 109 lb 8 oz (49.7 kg)  04/29/23 108 lb 11.2 oz (49.3 kg)  04/21/23 110 lb 11.2 oz (50.2 kg)     GENERAL:alert, no  distress and comfortable SKIN: skin color, texture, turgor are normal, no rashes or significant lesions EYES: normal, Conjunctiva are pink and non-injected, sclera clear NECK: supple, thyroid normal size, non-tender, without nodularity LYMPH:  no palpable lymphadenopathy in the cervical, axillary  LUNGS: clear to auscultation and percussion with normal breathing effort HEART: regular rate & rhythm and no murmurs and no lower extremity edema ABDOMEN:abdomen soft, non-tender and normal bowel sounds Musculoskeletal:no cyanosis of digits and no clubbing  NEURO: alert & oriented x 3 with fluent speech, no focal motor/sensory deficits  Physical Exam   LABORATORY DATA:  I have reviewed the data as listed    Latest Ref Rng & Units 04/28/2023    8:27 AM 04/21/2023    9:55 AM 03/31/2023    8:44 AM  CBC  WBC 4.0 - 10.5 K/uL 10.6  15.6  8.3   Hemoglobin 12.0 - 15.0 g/dL 09.8  11.9  9.2   Hematocrit 36.0 - 46.0 % 32.1  32.6  28.7   Platelets 150 - 400 K/uL 409  778  532         Latest Ref Rng & Units 05/03/2023   10:00 AM 04/28/2023    8:27 AM 04/21/2023    9:55 AM  CMP  Glucose 70 - 99 mg/dL 147  829  562   BUN 8 - 23 mg/dL 10  16  11    Creatinine 0.44 - 1.00 mg/dL 1.30  8.65  7.84   Sodium 135 - 145 mmol/L 135  137  136   Potassium 3.5 - 5.1 mmol/L 3.8  4.1  4.3   Chloride 98 - 111 mmol/L 101  102  101   CO2 22 - 32 mmol/L 29  30  31    Calcium 8.9 - 10.3 mg/dL 9.0  8.9  8.7   Total Protein 6.5 - 8.1 g/dL 6.4  6.2  6.0   Total Bilirubin 0.0 - 1.2 mg/dL 8.9  4.5  0.6   Alkaline Phos 38 - 126 U/L 833  973  872   AST 15 - 41 U/L 177  191  57   ALT 0 - 44 U/L 165  154  45       RADIOGRAPHIC STUDIES: I have personally reviewed the radiological images as listed and agreed with the findings in the report. CT CHEST ABDOMEN PELVIS W CONTRAST Result Date: 05/04/2023 CLINICAL DATA:  Pancreatic cancer. Chemotherapy and radiation therapy ongoing. Duodenal stent. Biliary drain. * Tracking Code: BO  * EXAM: CT CHEST, ABDOMEN, AND PELVIS WITH CONTRAST TECHNIQUE: Multidetector CT imaging of the chest, abdomen and pelvis was performed following the standard protocol during bolus administration of intravenous contrast. RADIATION DOSE REDUCTION: This exam was performed according to the  departmental dose-optimization program which includes automated exposure control, adjustment of the mA and/or kV according to patient size and/or use of iterative reconstruction technique. CONTRAST:  75 mL Omnipaque COMPARISON:  Outside CT 03/16/2023 FINDINGS: CT CHEST FINDINGS Cardiovascular: Port in the anterior chest wall with tip in distal SVC. No significant vascular findings. Normal heart size. No pericardial effusion. Mediastinum/Nodes: No axillary or supraclavicular adenopathy. No mediastinal or hilar adenopathy. No pericardial fluid. Esophagus normal. Lungs/Pleura: Irregular nodule in the LEFT upper lobe measures 7 mm (image 31/3) compared to 6 mm on CT 12/17/2022. The nodule appears slightly more thickened. No new pulmonary nodules. No pleural fluid. Musculoskeletal: No aggressive osseous lesion. CT ABDOMEN AND PELVIS FINDINGS Hepatobiliary: There is moderate intra and extra extrahepatic biliary duct dilatation which is similar to CT 03/16/2023. Common hepatic duct measures 15 mm (62/2) compared to 14 mm. Intrahepatic duct on the LEFT measures 11 mm (image 54/2) compared to 11 mm. Common bile duct measures 9 mm similar prior. Biliary drain is position more superficial than comparison exam. The pigtail is coiled along the capsule of the RIGHT hepatic lobe where previously coil was positioned more centrally in the gallbladder fossa. Pancreas: Fluid collection in the body of the pancreas measures 4.63.6 cm decreased from 5.6 x 4.4 cm. Pancreatic mass is difficult to define. Spleen: Normal spleen Adrenals/urinary tract: Adrenal glands and kidneys are normal. The ureters and bladder normal. Stomach/Bowel: Stomach contains oral  contrast. Duodenal stent is noted. Contrast flows through the stent into the small bowel. No obstruction. Colon is normal. Vascular/Lymphatic: Abdominal aorta is normal caliber with atherosclerotic calcification. There is no retroperitoneal or periportal lymphadenopathy. No pelvic lymphadenopathy. Reproductive: Fibroid uterus. Other: No intraperitoneal free fluid.  No peritoneal nodularity. Musculoskeletal: No aggressive osseous lesion. IMPRESSION: 1. Moderate intra and extrahepatic biliary duct dilatation similar to comparison CT 03/16/2023. 2. Percutaneous biliary drain is position more superficial than comparison exam along the capsule the liver. 3. No ascites. 4. Duodenal stent appears patent and unchanged. 5. Fluid collection along the body the pancreas is decreased in volume. Pancreatic mass poorly defined. 6. No peritoneal metastasis identified. 7. Irregular nodule in the LEFT upper lobe is stable. Recommend attention on follow-up. Electronically Signed   By: Genevive Bi M.D.   On: 05/04/2023 11:58      No orders of the defined types were placed in this encounter.  All questions were answered. The patient knows to call the clinic with any problems, questions or concerns. No barriers to learning was detected. The total time spent in the appointment was 30 minutes.     Malachy Mood, MD 05/04/2023

## 2023-05-05 ENCOUNTER — Ambulatory Visit

## 2023-05-05 ENCOUNTER — Other Ambulatory Visit: Payer: Self-pay

## 2023-05-05 ENCOUNTER — Ambulatory Visit
Admission: RE | Admit: 2023-05-05 | Discharge: 2023-05-05 | Disposition: A | Discharge status: Home / Self Care | Source: Ambulatory Visit | Attending: Radiation Oncology

## 2023-05-05 ENCOUNTER — Inpatient Hospital Stay

## 2023-05-05 ENCOUNTER — Encounter: Admitting: Dietician

## 2023-05-05 ENCOUNTER — Inpatient Hospital Stay: Admitting: Nurse Practitioner

## 2023-05-05 DIAGNOSIS — R17 Unspecified jaundice: Secondary | ICD-10-CM

## 2023-05-05 DIAGNOSIS — E860 Dehydration: Secondary | ICD-10-CM

## 2023-05-05 DIAGNOSIS — C259 Malignant neoplasm of pancreas, unspecified: Secondary | ICD-10-CM

## 2023-05-05 LAB — RAD ONC ARIA SESSION SUMMARY
Course Elapsed Days: 9
Plan Fractions Treated to Date: 8
Plan Prescribed Dose Per Fraction: 1.8 Gy
Plan Total Fractions Prescribed: 25
Plan Total Prescribed Dose: 45 Gy
Reference Point Dosage Given to Date: 14.4 Gy
Reference Point Session Dosage Given: 1.8 Gy
Session Number: 8

## 2023-05-05 LAB — LACTATE DEHYDROGENASE: LDH: 218 U/L — ABNORMAL HIGH (ref 98–192)

## 2023-05-05 LAB — CMP (CANCER CENTER ONLY)
ALT: 163 U/L — ABNORMAL HIGH (ref 0–44)
AST: 186 U/L (ref 15–41)
Albumin: 2.9 g/dL — ABNORMAL LOW (ref 3.5–5.0)
Alkaline Phosphatase: 769 U/L — ABNORMAL HIGH (ref 38–126)
Anion gap: 8 (ref 5–15)
BUN: 11 mg/dL (ref 8–23)
CO2: 28 mmol/L (ref 22–32)
Calcium: 8.7 mg/dL — ABNORMAL LOW (ref 8.9–10.3)
Chloride: 96 mmol/L — ABNORMAL LOW (ref 98–111)
Creatinine: <0.3 mg/dL — ABNORMAL LOW (ref 0.44–1.00)
Glucose, Bld: 144 mg/dL — ABNORMAL HIGH (ref 70–99)
Potassium: 4.1 mmol/L (ref 3.5–5.1)
Sodium: 132 mmol/L — ABNORMAL LOW (ref 135–145)
Total Bilirubin: 10.3 mg/dL (ref 0.0–1.2)
Total Protein: 6.5 g/dL (ref 6.5–8.1)

## 2023-05-05 LAB — BILIRUBIN, FRACTIONATED(TOT/DIR/INDIR)
Bilirubin, Direct: 6.9 mg/dL — ABNORMAL HIGH (ref 0.0–0.2)
Indirect Bilirubin: 3.4 mg/dL — ABNORMAL HIGH (ref 0.3–0.9)
Total Bilirubin: 10.3 mg/dL — ABNORMAL HIGH (ref 0.0–1.2)

## 2023-05-05 LAB — CBC WITH DIFFERENTIAL (CANCER CENTER ONLY)
Abs Immature Granulocytes: 0.08 10*3/uL — ABNORMAL HIGH (ref 0.00–0.07)
Basophils Absolute: 0.1 10*3/uL (ref 0.0–0.1)
Basophils Relative: 1 %
Eosinophils Absolute: 0.4 10*3/uL (ref 0.0–0.5)
Eosinophils Relative: 4 %
HCT: 28.7 % — ABNORMAL LOW (ref 36.0–46.0)
Hemoglobin: 10.3 g/dL — ABNORMAL LOW (ref 12.0–15.0)
Immature Granulocytes: 1 %
Lymphocytes Relative: 8 %
Lymphs Abs: 0.8 10*3/uL (ref 0.7–4.0)
MCH: 31.8 pg (ref 26.0–34.0)
MCHC: 35.9 g/dL (ref 30.0–36.0)
MCV: 88.6 fL (ref 80.0–100.0)
Monocytes Absolute: 1.2 10*3/uL — ABNORMAL HIGH (ref 0.1–1.0)
Monocytes Relative: 12 %
Neutro Abs: 6.9 10*3/uL (ref 1.7–7.7)
Neutrophils Relative %: 74 %
Platelet Count: 389 10*3/uL (ref 150–400)
RBC: 3.24 MIL/uL — ABNORMAL LOW (ref 3.87–5.11)
RDW: 24.2 % — ABNORMAL HIGH (ref 11.5–15.5)
WBC Count: 9.4 10*3/uL (ref 4.0–10.5)
nRBC: 0 % (ref 0.0–0.2)

## 2023-05-05 LAB — RETIC PANEL
Immature Retic Fract: 15.2 % (ref 2.3–15.9)
RBC.: 3.25 MIL/uL — ABNORMAL LOW (ref 3.87–5.11)
Retic Count, Absolute: 99.8 10*3/uL (ref 19.0–186.0)
Retic Ct Pct: 3.1 % (ref 0.4–3.1)
Reticulocyte Hemoglobin: 38.1 pg (ref >27.9)

## 2023-05-05 MED ORDER — HEPARIN SOD (PORK) LOCK FLUSH 100 UNIT/ML IV SOLN
500.0000 [IU] | Freq: Once | INTRAVENOUS | Status: AC | PRN
  Administered 2023-05-05: 500 [IU]

## 2023-05-05 MED ORDER — SODIUM CHLORIDE 0.9% FLUSH
10.0000 mL | Freq: Once | INTRAVENOUS | Status: AC | PRN
Start: 2023-05-05 — End: 2023-05-05
  Administered 2023-05-05: 10 mL

## 2023-05-05 NOTE — Progress Notes (Unsigned)
 Critical Value: AST = 168 and Tbil = 10.3. Called and Read back to Sharlette Dense at [place] on 9Apr25 by [place].

## 2023-05-05 NOTE — Addendum Note (Signed)
 Addended by: Malachy Mood on: 05/05/2023 04:10 PM   Modules accepted: Orders

## 2023-05-06 ENCOUNTER — Encounter (HOSPITAL_COMMUNITY): Payer: Self-pay

## 2023-05-06 ENCOUNTER — Encounter (HOSPITAL_COMMUNITY): Payer: Self-pay | Admitting: Internal Medicine

## 2023-05-06 ENCOUNTER — Ambulatory Visit
Admission: RE | Admit: 2023-05-06 | Discharge: 2023-05-06 | Discharge status: Home / Self Care | Source: Ambulatory Visit | Attending: Radiation Oncology

## 2023-05-06 ENCOUNTER — Other Ambulatory Visit: Payer: Self-pay

## 2023-05-06 ENCOUNTER — Inpatient Hospital Stay (HOSPITAL_COMMUNITY)
Admission: AD | Admit: 2023-05-06 | Discharge: 2023-05-09 | DRG: 445 | Disposition: A | Discharge status: Home / Self Care | Source: Ambulatory Visit | Attending: Internal Medicine | Admitting: Internal Medicine

## 2023-05-06 ENCOUNTER — Encounter: Payer: Self-pay | Admitting: Hematology

## 2023-05-06 ENCOUNTER — Inpatient Hospital Stay (HOSPITAL_BASED_OUTPATIENT_CLINIC_OR_DEPARTMENT_OTHER): Admitting: Hematology

## 2023-05-06 VITALS — BP 110/62 | HR 94 | Temp 97.5°F | Resp 16

## 2023-05-06 DIAGNOSIS — Z66 Do not resuscitate: Secondary | ICD-10-CM | POA: Diagnosis present

## 2023-05-06 DIAGNOSIS — Z825 Family history of asthma and other chronic lower respiratory diseases: Secondary | ICD-10-CM

## 2023-05-06 DIAGNOSIS — I82890 Acute embolism and thrombosis of other specified veins: Secondary | ICD-10-CM | POA: Diagnosis present

## 2023-05-06 DIAGNOSIS — K5903 Drug induced constipation: Secondary | ICD-10-CM | POA: Diagnosis present

## 2023-05-06 DIAGNOSIS — F419 Anxiety disorder, unspecified: Secondary | ICD-10-CM | POA: Diagnosis present

## 2023-05-06 DIAGNOSIS — Z9221 Personal history of antineoplastic chemotherapy: Secondary | ICD-10-CM | POA: Diagnosis not present

## 2023-05-06 DIAGNOSIS — C259 Malignant neoplasm of pancreas, unspecified: Secondary | ICD-10-CM | POA: Diagnosis present

## 2023-05-06 DIAGNOSIS — F32A Depression, unspecified: Secondary | ICD-10-CM | POA: Diagnosis present

## 2023-05-06 DIAGNOSIS — L299 Pruritus, unspecified: Secondary | ICD-10-CM | POA: Diagnosis present

## 2023-05-06 DIAGNOSIS — G893 Neoplasm related pain (acute) (chronic): Secondary | ICD-10-CM | POA: Diagnosis present

## 2023-05-06 DIAGNOSIS — Z8261 Family history of arthritis: Secondary | ICD-10-CM | POA: Diagnosis not present

## 2023-05-06 DIAGNOSIS — Z23 Encounter for immunization: Secondary | ICD-10-CM | POA: Diagnosis not present

## 2023-05-06 DIAGNOSIS — R7989 Other specified abnormal findings of blood chemistry: Secondary | ICD-10-CM | POA: Diagnosis present

## 2023-05-06 DIAGNOSIS — Z833 Family history of diabetes mellitus: Secondary | ICD-10-CM

## 2023-05-06 DIAGNOSIS — F17210 Nicotine dependence, cigarettes, uncomplicated: Secondary | ICD-10-CM | POA: Diagnosis present

## 2023-05-06 DIAGNOSIS — Z820 Family history of epilepsy and other diseases of the nervous system: Secondary | ICD-10-CM

## 2023-05-06 DIAGNOSIS — M549 Dorsalgia, unspecified: Secondary | ICD-10-CM | POA: Diagnosis present

## 2023-05-06 DIAGNOSIS — E871 Hypo-osmolality and hyponatremia: Secondary | ICD-10-CM | POA: Diagnosis present

## 2023-05-06 DIAGNOSIS — C801 Malignant (primary) neoplasm, unspecified: Secondary | ICD-10-CM | POA: Diagnosis not present

## 2023-05-06 DIAGNOSIS — R160 Hepatomegaly, not elsewhere classified: Secondary | ICD-10-CM | POA: Diagnosis present

## 2023-05-06 DIAGNOSIS — D630 Anemia in neoplastic disease: Secondary | ICD-10-CM | POA: Diagnosis present

## 2023-05-06 DIAGNOSIS — Z79899 Other long term (current) drug therapy: Secondary | ICD-10-CM | POA: Diagnosis not present

## 2023-05-06 DIAGNOSIS — T40605A Adverse effect of unspecified narcotics, initial encounter: Secondary | ICD-10-CM | POA: Diagnosis present

## 2023-05-06 DIAGNOSIS — K831 Obstruction of bile duct: Principal | ICD-10-CM

## 2023-05-06 DIAGNOSIS — Z7901 Long term (current) use of anticoagulants: Secondary | ICD-10-CM

## 2023-05-06 DIAGNOSIS — Z923 Personal history of irradiation: Secondary | ICD-10-CM

## 2023-05-06 DIAGNOSIS — G629 Polyneuropathy, unspecified: Secondary | ICD-10-CM | POA: Diagnosis present

## 2023-05-06 LAB — RAD ONC ARIA SESSION SUMMARY
Course Elapsed Days: 10
Plan Fractions Treated to Date: 9
Plan Prescribed Dose Per Fraction: 1.8 Gy
Plan Total Fractions Prescribed: 25
Plan Total Prescribed Dose: 45 Gy
Reference Point Dosage Given to Date: 16.2 Gy
Reference Point Session Dosage Given: 1.8 Gy
Session Number: 9

## 2023-05-06 MED ORDER — SODIUM CHLORIDE 0.9 % IV SOLN: INTRAVENOUS | Status: DC

## 2023-05-06 MED ORDER — SENNOSIDES-DOCUSATE SODIUM 8.6-50 MG PO TABS
1.0000 | ORAL_TABLET | Freq: Every evening | ORAL | Status: DC | PRN
  Administered 2023-05-08: 1 via ORAL
  Filled 2023-05-06: qty 1

## 2023-05-06 MED ORDER — INFLUENZA VIRUS VACC SPLIT PF (FLUZONE) 0.5 ML IM SUSY
0.5000 mL | PREFILLED_SYRINGE | INTRAMUSCULAR | Status: AC
  Administered 2023-05-07: 0.5 mL via INTRAMUSCULAR
  Filled 2023-05-06: qty 0.5

## 2023-05-06 MED ORDER — QUETIAPINE FUMARATE 200 MG PO TABS
200.0000 mg | ORAL_TABLET | Freq: Every day | ORAL | Status: DC
  Administered 2023-05-06 – 2023-05-08 (×3): 200 mg via ORAL
  Filled 2023-05-06 (×3): qty 1

## 2023-05-06 MED ORDER — ONDANSETRON HCL 4 MG PO TABS: 4.0000 mg | ORAL_TABLET | Freq: Four times a day (QID) | ORAL | Status: DC | PRN

## 2023-05-06 MED ORDER — CEFTRIAXONE SODIUM 1 G IJ SOLR: 1.0000 g | Freq: Once | INTRAMUSCULAR | Status: DC

## 2023-05-06 MED ORDER — SODIUM CHLORIDE 0.9 % IV SOLN
2.0000 g | Freq: Once | INTRAVENOUS | Status: AC
  Administered 2023-05-07: 2 g via INTRAVENOUS
  Filled 2023-05-06: qty 2

## 2023-05-06 MED ORDER — MIRTAZAPINE 30 MG PO TABS
45.0000 mg | ORAL_TABLET | Freq: Every day | ORAL | Status: DC
  Administered 2023-05-06 – 2023-05-08 (×3): 45 mg via ORAL
  Filled 2023-05-06 (×3): qty 1

## 2023-05-06 MED ORDER — HYDROMORPHONE HCL 1 MG/ML IJ SOLN
0.5000 mg | INTRAMUSCULAR | Status: DC | PRN
  Administered 2023-05-06 – 2023-05-09 (×16): 1 mg via INTRAVENOUS
  Filled 2023-05-06 (×16): qty 1

## 2023-05-06 MED ORDER — IBUPROFEN 200 MG PO TABS: 400.0000 mg | ORAL_TABLET | Freq: Four times a day (QID) | ORAL | Status: DC | PRN

## 2023-05-06 MED ORDER — GABAPENTIN 300 MG PO CAPS
900.0000 mg | ORAL_CAPSULE | Freq: Once | ORAL | Status: AC
  Administered 2023-05-06: 900 mg via ORAL
  Filled 2023-05-06: qty 3

## 2023-05-06 MED ORDER — PNEUMOCOCCAL 20-VAL CONJ VACC 0.5 ML IM SUSY
0.5000 mL | PREFILLED_SYRINGE | INTRAMUSCULAR | Status: AC
  Administered 2023-05-07: 0.5 mL via INTRAMUSCULAR
  Filled 2023-05-06: qty 0.5

## 2023-05-06 MED ORDER — OXYCODONE HCL 5 MG PO TABS
10.0000 mg | ORAL_TABLET | ORAL | Status: DC | PRN
  Administered 2023-05-06 – 2023-05-08 (×5): 10 mg via ORAL
  Filled 2023-05-06 (×5): qty 2

## 2023-05-06 MED ORDER — ONDANSETRON HCL 4 MG/2ML IJ SOLN
4.0000 mg | Freq: Four times a day (QID) | INTRAMUSCULAR | Status: DC | PRN
  Administered 2023-05-06: 4 mg via INTRAVENOUS
  Filled 2023-05-06: qty 2

## 2023-05-06 MED ORDER — FAMOTIDINE 20 MG PO TABS
20.0000 mg | ORAL_TABLET | Freq: Two times a day (BID) | ORAL | Status: DC
  Administered 2023-05-06 – 2023-05-09 (×6): 20 mg via ORAL
  Filled 2023-05-06 (×6): qty 1

## 2023-05-06 MED ORDER — ALBUTEROL SULFATE (2.5 MG/3ML) 0.083% IN NEBU: 2.5000 mg | INHALATION_SOLUTION | RESPIRATORY_TRACT | Status: DC | PRN

## 2023-05-06 NOTE — Consult Note (Signed)
 Chief Complaint: Pancreatic cancer, biliary obstruction/hyperbilirubinemia; referred for percutaneous transhepatic cholangiogram  with biliary drain placement    Referring Physician(s): Feng,Yan  Supervising Physician: Irish Lack  Patient Status: [facility] - In-pt    History of Present Illness: Kathryn Underwood is a 63 y.o. female with history of ETOH abuse, anxiety, depression, neuropathy, and unresectable pancreatic adenocarcinoma-s/p chemotherapy and radiation. Patient is known to IR due to cholecystostomy tube placed 12/13/2022 with routine exchanges. Patient admitted to [facility] 05/06/23 due to worsening bilirubinemia. CT chest/abdomen/pelvis ordered by Dr. Mosetta Putt and performed 05/04/2023, impression below.    Despite the patient having a cholecystostomy there is evidence of ductal dilatation. IR consulted to assist with a new biliary drain placement. After review of the patient's case, new biliary drain placement approved by Dr. Fredia Sorrow.   Past Medical History:  Diagnosis Date   Alcohol abuse    Anxiety    Chickenpox    Depression    Neuromuscular disorder (HCC)    neuropathy   Pancreatic cancer (HCC)    Substance abuse [facility])     Past Surgical History:  Procedure Laterality Date   BIOPSY  12/10/2022   Procedure: BIOPSY;  Surgeon: Mansouraty, Netty Starring., MD;  Location: [facility];  Service: Gastroenterology;;   CESAREAN SECTION     DUODENAL STENT PLACEMENT N/A 01/06/2023   Procedure: DUODENAL STENT PLACEMENT;  Surgeon: Lemar Lofty., MD;  Location: [facility];  Service: Gastroenterology;  Laterality: N/A;   ESOPHAGOGASTRODUODENOSCOPY N/A 08/14/2021   Procedure: ESOPHAGOGASTRODUODENOSCOPY (EGD);  Surgeon: Rachael Fee, MD;  Location: [facility];  Service: Gastroenterology;  Laterality: N/A;   ESOPHAGOGASTRODUODENOSCOPY N/A 12/12/2022   Procedure: ESOPHAGOGASTRODUODENOSCOPY (EGD);  Surgeon: Lemar Lofty., MD;  Location: [facility]  ENDOSCOPY;  Service: Gastroenterology;  Laterality: N/A;   ESOPHAGOGASTRODUODENOSCOPY (EGD) WITH PROPOFOL N/A 12/10/2022   Procedure: ESOPHAGOGASTRODUODENOSCOPY (EGD) WITH PROPOFOL;  Surgeon: Meridee Score Netty Starring., MD;  Location: [facility] ENDOSCOPY;  Service: Gastroenterology;  Laterality: N/A;   ESOPHAGOGASTRODUODENOSCOPY (EGD) WITH PROPOFOL N/A 01/06/2023   Procedure: ESOPHAGOGASTRODUODENOSCOPY (EGD) WITH PROPOFOL;  Surgeon: Meridee Score Netty Starring., MD;  Location: [facility] ENDOSCOPY;  Service: Gastroenterology;  Laterality: N/A;  with duodenal stent placement   EUS N/A 08/14/2021   Procedure: UPPER ENDOSCOPIC ULTRASOUND (EUS) RADIAL;  Surgeon: Rachael Fee, MD;  Location: [facility] ENDOSCOPY;  Service: Gastroenterology;  Laterality: N/A;   EUS N/A 12/12/2022   Procedure: UPPER ENDOSCOPIC ULTRASOUND (EUS) LINEAR;  Surgeon: Lemar Lofty., MD;  Location: [facility]COPY;  Service: Gastroenterology;  Laterality: N/A;   EUS N/A 01/06/2023   Procedure: FULL UPPER ENDOSCOPIC ULTRASOUND (EUS) RADIAL;  Surgeon: Meridee Score Netty Starring., MD;  Location: [facility]COPY;  Service: Gastroenterology;  Laterality: N/A;   FINE NEEDLE ASPIRATION N/A 08/14/2021   Procedure: FINE NEEDLE ASPIRATION (FNA) LINEAR;  Surgeon: Rachael Fee, MD;  Location: [facility];  Service: Gastroenterology;  Laterality: N/A;   FINE NEEDLE ASPIRATION N/A 12/12/2022   Procedure: FINE NEEDLE ASPIRATION (FNA) LINEAR;  Surgeon: Lemar Lofty., MD;  Location: [facility];  Service: Gastroenterology;  Laterality: N/A;   FINE NEEDLE ASPIRATION  01/06/2023   Procedure: FINE NEEDLE ASPIRATION;  Surgeon: Lemar Lofty., MD;  Location: [facility]OS[facility];  Service: Gastroenterology;;   IR CHOLANGIOGRAM EXISTING TUBE  04/29/2023   IR EXCHANGE BILIARY DRAIN  01/29/2023   IR EXCHANGE BILIARY DRAIN  03/05/2023   IR EXCHANGE BILIARY DRAIN  04/16/2023   IR IMAGING GUIDED PORT INSERTION  12/18/2022   IR PERC CHOLECYSTOSTOMY  12/13/2022    TONSILLECTOMY  Allergies: Patient has no known allergies.  Medications: Prior to Admission medications   Medication Sig Start Date End Date Taking? Authorizing Provider  ELIQUIS 5 MG TABS tablet TAKE 1 TABLET BY MOUTH TWICE A DAY 04/09/23  Yes Boscia, Kathlynn Grate, NP  Ensure Max Protein (ENSURE MAX PROTEIN) LIQD Take 330 mLs (11 oz total) by mouth 2 (two) times daily. 12/18/22  Yes Almon Hercules, MD  famotidine (PEPCID) 20 MG tablet Take 1 tablet (20 mg total) by mouth 2 (two) times daily. 04/21/23  Yes Pickenpack-Cousar, Arty Baumgartner, NP  gabapentin (NEURONTIN) 300 MG capsule Take 2 capsules (600 mg total) by mouth 2 (two) times daily. 03/24/23  Yes Pickenpack-Cousar, Arty Baumgartner, NP  lidocaine-prilocaine (EMLA) cream Apply to affected area once 12/28/22  Yes Malachy Mood, MD  mirtazapine (REMERON) 45 MG tablet Take 1 tablet (45 mg total) by mouth at bedtime. 03/24/23  Yes Pickenpack-Cousar, Arty Baumgartner, NP  Multiple Vitamin (MULTIVITAMIN ADULT PO) Take 1 tablet by mouth daily.   Yes [provider]  ondansetron (ZOFRAN) 8 MG tablet Take 1 tablet (8 mg total) by mouth every 8 (eight) hours as needed for nausea or vomiting. 12/28/22  Yes Malachy Mood, MD  Oxycodone HCl 10 MG TABS Take 1 tablet (10 mg total) by mouth every 4 (four) hours as needed. Patient taking differently: Take 10 mg by mouth every 3 (three) hours as needed (pain). 04/23/23  Yes Pickenpack-Cousar, Arty Baumgartner, NP  prochlorperazine (COMPAZINE) 10 MG tablet Take 1 tablet (10 mg total) by mouth every 6 (six) hours as needed for nausea or vomiting. 12/28/22  Yes Malachy Mood, MD  QUEtiapine (SEROQUEL) 100 MG tablet Take 2 tablets (200 mg total) by mouth at bedtime. 03/24/23  Yes Pickenpack-Cousar, Arty Baumgartner, NP  sodium chloride flush (NS) 0.9 % SOLN Use 5 - 10 mLs to flush abdominal drain once daily as directed 12/18/22  Yes Ave Scharnhorst K, PA-C  Vitamin D, Ergocalciferol, (DRISDOL) 1.25 MG (50000 UNIT) CAPS capsule Take 1 capsule (50,000 Units  total) by mouth every 7 (seven) days. Patient taking differently: Take 50,000 Units by mouth every 7 (seven) days. Sundays 01/02/23  Yes Alwyn Ren, MD  furosemide (LASIX) 20 MG tablet TAKE 1 TABLET (20 MG TOTAL) BY MOUTH DAILY. FOR 5 DAYS THEN AS NEEDED FOR LEG EDEMA Patient not taking: Reported on 05/06/2023 04/19/23   Malachy Mood, MD  potassium chloride (KLOR-CON M) 10 MEQ tablet TAKE 1 TABLET (10 MEQ TOTAL) BY MOUTH DAILY. WHEN YOU TAKE LASIX Patient not taking: Reported on 05/06/2023 04/19/23   Malachy Mood, MD  sucralfate (CARAFATE) 1 GM/10ML suspension TAKE 10 MLS (1 G TOTAL) BY MOUTH 4 (FOUR) TIMES DAILY - WITH MEALS AND AT BEDTIME. Patient not taking: Reported on 05/06/2023 03/18/23   Allwardt, Crist Infante, PA-C     Family History  Problem Relation Age of Onset   Dementia Mother    Arthritis Father    Asthma Father    Asthma Brother    Diabetes Maternal Grandmother    Dementia Maternal Grandmother    Diabetes Paternal Grandmother    Parkinson's disease Maternal Grandfather    Parkinson's disease Paternal Grandfather    Breast cancer Neg Hx    Colon cancer Neg Hx     Social History   Socioeconomic History   Marital status: Divorced    Spouse name: Not on file   Number of children: Not on file   Years of education: Not on file   Highest education  level: Not on file  Occupational History   Not on file  Tobacco Use   Smoking status: Some Days    Current packs/day: 0.50    Types: Cigarettes   Smokeless tobacco: Never  Vaping Use   Vaping status: Never Used  Substance and Sexual Activity   Alcohol use: Not Currently    Alcohol/week: 16.0 standard drinks of alcohol    Types: 16 Glasses of wine per week    Comment: daily (1.5 liter bottle wine every 12)   Drug use: Not Currently    Types: Cocaine, Marijuana   Sexual activity: Yes    Partners: Male    Comment: female partner  Other Topics Concern   Not on file  Social History Narrative   Divorced. Homemaker. HS grad.     Lives with life partner (stan)   Drinks caffeine.   Wears seatbelt. Smoke dectector in the home. No firearms in the home.    Feels safe in her relationships.     Social Drivers of Corporate investment banker Strain: Not on file  Food Insecurity: No Food Insecurity (01/03/2023)   Hunger Vital Sign    Worried About Running Out of Food in the Last Year: Never true    Ran Out of Food in the Last Year: Never true  Transportation Needs: No Transportation Needs (01/03/2023)   PRAPARE - Administrator, Civil Service (Medical): No    Lack of Transportation (Non-Medical): No  Physical Activity: Not on file  Stress: Not on file  Social Connections: Not on file     Review of Systems denies fever,HA,CP,dyspnea, N/V or bleeding ; she does have abdominal bloating, back pain, jaundice  Vital Signs: BP 113/67 (BP Location: Right Arm)   Pulse 92   Temp 98.6 F (37 C) (Oral)   Resp 16   SpO2 95%     Physical Exam: awake/alert; jaundiced with scleral icterus; chest- distant BS bilat; intact left chest wall port a cath; heart-  nl rate, some ectopy noted; abd-soft, protuberant,+BS, intact GB drain with yellow bile in bag; no LE edema  Imaging: CT CHEST ABDOMEN PELVIS W CONTRAST Result Date: 05/04/2023 CLINICAL DATA:  Pancreatic cancer. Chemotherapy and radiation therapy ongoing. Duodenal stent. Biliary drain. * Tracking Code: BO * EXAM: CT CHEST, ABDOMEN, AND PELVIS WITH CONTRAST TECHNIQUE: Multidetector CT imaging of the chest, abdomen and pelvis was performed following the standard protocol during bolus administration of intravenous contrast. RADIATION DOSE REDUCTION: This exam was performed according to the departmental dose-optimization program which includes automated exposure control, adjustment of the mA and/or kV according to patient size and/or use of iterative reconstruction technique. CONTRAST:  75 mL Omnipaque COMPARISON:  Outside CT 03/16/2023 FINDINGS: CT CHEST FINDINGS  Cardiovascular: Port in the anterior chest wall with tip in distal SVC. No significant vascular findings. Normal heart size. No pericardial effusion. Mediastinum/Nodes: No axillary or supraclavicular adenopathy. No mediastinal or hilar adenopathy. No pericardial fluid. Esophagus normal. Lungs/Pleura: Irregular nodule in the LEFT upper lobe measures 7 mm (image 31/3) compared to 6 mm on CT 12/17/2022. The nodule appears slightly more thickened. No new pulmonary nodules. No pleural fluid. Musculoskeletal: No aggressive osseous lesion. CT ABDOMEN AND PELVIS FINDINGS Hepatobiliary: There is moderate intra and extra extrahepatic biliary duct dilatation which is similar to CT 03/16/2023. Common hepatic duct measures 15 mm (62/2) compared to 14 mm. Intrahepatic duct on the LEFT measures 11 mm (image 54/2) compared to 11 mm. Common bile duct measures 9 mm  similar prior. Biliary drain is position more superficial than comparison exam. The pigtail is coiled along the capsule of the RIGHT hepatic lobe where previously coil was positioned more centrally in the gallbladder fossa. Pancreas: Fluid collection in the body of the pancreas measures 4.63.6 cm decreased from 5.6 x 4.4 cm. Pancreatic mass is difficult to define. Spleen: Normal spleen Adrenals/urinary tract: Adrenal glands and kidneys are normal. The ureters and bladder normal. Stomach/Bowel: Stomach contains oral contrast. Duodenal stent is noted. Contrast flows through the stent into the small bowel. No obstruction. Colon is normal. Vascular/Lymphatic: Abdominal aorta is normal caliber with atherosclerotic calcification. There is no retroperitoneal or periportal lymphadenopathy. No pelvic lymphadenopathy. Reproductive: Fibroid uterus. Other: No intraperitoneal free fluid.  No peritoneal nodularity. Musculoskeletal: No aggressive osseous lesion. IMPRESSION: 1. Moderate intra and extrahepatic biliary duct dilatation similar to comparison CT 03/16/2023. 2. Percutaneous  biliary drain is position more superficial than comparison exam along the capsule the liver. 3. No ascites. 4. Duodenal stent appears patent and unchanged. 5. Fluid collection along the body the pancreas is decreased in volume. Pancreatic mass poorly defined. 6. No peritoneal metastasis identified. 7. Irregular nodule in the LEFT upper lobe is stable. Recommend attention on follow-up. Electronically Signed   By: Genevive Bi M.D.   On: 05/04/2023 11:58   IR CHOLANGIOGRAM EXISTING TUBE Result Date: 04/29/2023 CLINICAL DATA:  increasing LFT's Receipt, 63 year old female with a history of pancreatic adenocarcinoma with biliary obstruction. Percutaneous cholecystostomy placed 12/13/2022, most recently exchanged 04/16/2023. Recent tug, with concern for catheter dislodgement. EXAM: CHOLANGIOGRAM VIA EXISTING CATHETER COMPARISON:  IR fluoroscopy, 04/16/2023.  CT AP, 03/16/2023. CONTRAST:  10 mL Omnipaque 300-administered via the existing percutaneous drain. FLUOROSCOPY TIME:  Fluoroscopic dose; 1 mGy TECHNIQUE: The patient was positioned supine on the fluoroscopy table. A preprocedural spot fluoroscopic image was obtained of the RIGHT upper quadrant and the existing percutaneous drainage catheter. Multiple spot fluoroscopic and radiographic images were obtained following the injection of a small amount of contrast via the existing percutaneous drainage catheter. FINDINGS: Stable positioning of RIGHT upper quadrant percutaneous cholecystostomy tube. Contrast injection demonstrated a normal functioning with opacification of the decompressed gallbladder. IMPRESSION: Stable positioning of well-functioning percutaneous cholecystostomy tube. RECOMMENDATIONS: The patient will return to Vascular Interventional Radiology (VIR) for routine drainage catheter evaluation and exchange scheduled for 05/28/2023. Roanna Banning, MD [facility] Electronically Signed   By: Roanna Banning M.D.   On: 04/29/2023 17:18   IR EXCHANGE BILIARY DRAIN Result Date: 04/16/2023 INDICATION: 63 year old female presents for routine percutaneous cholecystostomy exchange EXAM: IMAGE GUIDED PERCUTANEOUS CHOLECYSTOSTOMY EXCHANGE MEDICATIONS: None ANESTHESIA/SEDATION: None FLUOROSCOPY: Radiation Exposure Index (as provided by the fluoroscopic device): 4 mGy Kerma COMPLICATIONS: None PROCEDURE: Informed written consent was obtained from the patient after a thorough discussion of the procedural risks, benefits and alternatives. All questions were addressed. Maximal Sterile Barrier Technique was utilized including caps, mask, sterile gowns, sterile gloves, sterile drape, hand hygiene and skin antiseptic. A timeout was performed prior to the initiation of the procedure. Patient was position under the image intensifier. The drain in the upper abdomen were prepped and draped in the usual sterile fashion. 1% lidocaine was used for local anesthesia. Initial images were performed. Modified Seldinger technique was then used to exchange for a new 10 [place] percutaneous cholecystostomy. Contrast was injected confirming location. Drain sutured in position and attached to gravity drainage. Patient tolerated the procedure well and remained hemodynamically stable throughout. No complications were encountered and no significant blood  loss IMPRESSION: Status post image guided exchange of percutaneous cholecystostomy Signed, Yvone Neu. Miachel Roux, RPVI [facility] Electronically Signed   By: Gilmer Mor D.O.   On: 04/16/2023 16:44    Labs:  CBC: Recent Labs    03/31/23 0844 04/21/23 0955 04/28/23 0827 05/05/23 0948  WBC 8.3 15.6* 10.6* 9.4  HGB 9.2* 10.2* 10.7* 10.3*  HCT 28.7* 32.6* 32.1* 28.7*  PLT 532* 778* 409* 389    COAGS: Recent Labs    12/13/22 0611 12/27/22 0420 12/27/22 1211 01/29/23 0855  INR 1.3*  --   --  1.2  APTT  --  46*  97*  --     BMP: Recent Labs    04/21/23 0955 04/28/23 0827 05/03/23 1000 05/05/23 0948  NA 136 137 135 132*  K 4.3 4.1 3.8 4.1  CL 101 102 101 96*  CO2 31 30 29 28   GLUCOSE 102* 126* 147* 144*  BUN 11 16 10 11   CALCIUM 8.7* 8.9 9.0 8.7*  CREATININE 0.38* 0.41* 0.39* <0.30*  GFRNONAA >60 >60 >60 NOT CALCULATED    LIVER FUNCTION TESTS: Recent Labs    04/21/23 0955 04/28/23 0827 05/03/23 1000 05/05/23 0948  BILITOT 0.6 4.5* 8.9* 10.3*  10.3*  AST 57* 191* 177* 186*  ALT 45* 154* 165* 163*  ALKPHOS 872* 973* 833* 769*  PROT 6.0* 6.2* 6.4* 6.5  ALBUMIN 3.0* 3.2* 3.4* 2.9*    TUMOR MARKERS: No results for input(s): "AFPTM", "CEA", "CA199", "CHROMGRNA" in the last 8760 hours.  Assessment and Plan: 63 y.o. female with history of ETOH abuse, anxiety, depression, neuropathy, and unresectable pancreatic adenocarcinoma-s/p chemotherapy and radiation. Patient is known to IR due to cholecystostomy tube placed 12/13/2022 with routine exchanges. Patient admitted to [facility] 05/06/23 due to worsening bilirubinemia(t bili currently 10.3) Latest CT C/A/P on 05/04/23 revealed: 1. Moderate intra and extrahepatic biliary duct dilatation similar to comparison CT 03/16/2023. 2. Percutaneous biliary drain is position more superficial than comparison exam along the capsule the liver. 3. No ascites. 4. Duodenal stent appears patent and unchanged. 5. Fluid collection along the body the pancreas is decreased in volume. Pancreatic mass poorly defined. 6. No peritoneal metastasis identified. 7. Irregular nodule in the LEFT upper lobe is stable. Recommend attention on follow-up.  Request now received for PTC with biliary drain placement. Imaging studies were reviewed by Dr. Fredia Sorrow.   Risks and benefits discussed with the patient including bleeding, infection, damage to adjacent structures, bowel perforation/fistula connection, and sepsis.  All of the patient's questions were  answered, patient is agreeable to proceed. Consent signed and in chart.   Procedure tent scheduled for 4/11.   Thank you for this interesting consult.  I greatly enjoyed meeting Kathryn Underwood and look forward to participating in their care.  A copy of this report was sent to the requesting provider on this date.  Electronically Signed: Rosalita Levan, PA/Kevin Piers Baade,PA-C 05/06/2023, 4:05 PM   I spent a total of 20 Minutes    in face to face in clinical consultation, greater than 50% of which was counseling/coordinating care for percutaneous transhepatic cholangiogram with biliary drain placement

## 2023-05-06 NOTE — Assessment & Plan Note (Signed)
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 -She was readmitted to hospital for recurrent nausea and vomiting, likely partial gastric outlet obstruction from pancreatic tumor.  She is able to tolerate full liquid diet, she underwet duodenal stent placement on 01/06/2023 -she started chemo gemcitabine and abraxane on 01/26/2023  -her scan was reviewed by Dr. Freida Busman and her cancer was deemed to be unresectable, this is confirmed by Dr. Kathrynn Ducking at [facility]  -She started concurrent chemoradiation with Xeloda on April 26, 2023 -she developed jaundice on 04/28/2023

## 2023-05-06 NOTE — Progress Notes (Signed)
 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 -She was readmitted to hospital for recurrent nausea and vomiting, likely partial gastric outlet obstruction from pancreatic tumor.  She is able to tolerate full liquid diet, she underwet duodenal stent placement on 01/06/2023 -she started chemo gemcitabine and abraxane on 01/26/2023  -her scan was reviewed by Dr. Freida Busman and her cancer was deemed to be unresectable, this is confirmed by Dr. Kathrynn Ducking at [facility]  -She started concurrent chemoradiation with Xeloda on April 26, 2023 -she developed jaundice on 04/28/2023  Assessment & Plan Pancreatic cancer  -Unresectable -Status postchemotherapy, currently on radiation.  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 Decompression needed to reduce bilirubin levels. Gallbladder drainage alone may be insufficient, requiring additional liver drainage. - Consult interventional radiology for biliary drainage - Monitor bilirubin levels post-procedure - Maintain NPO status until procedure is confirmed  Plan -She is clinically stable, will admit her to [facility] by IR.  -I spoke with IR, no need MRI/MRCP this time     SUMMARY OF ONCOLOGIC HISTORY: Oncology History  Pancreatic adenocarcinoma (HCC)  12/12/2022 Cancer Staging   Staging form: Exocrine Pancreas, AJCC 8th Edition - Clinical stage from 12/12/2022: Stage IIA (cT3, cN0, cM0) - Signed by Malachy Mood, MD on 12/28/2022 Total positive nodes: 0   12/16/2022 Initial Diagnosis   Pancreatic adenocarcinoma (HCC)   01/26/2023 -  Chemotherapy   Patient is on Treatment Plan : PANCREATIC Abraxane D1,8 + Gemcitabine D1,8 q21d      Genetic Testing   Negative 76 gene [facility] CancerNext-Expanded +RNAinsight panel. The CancerNext-Expanded gene panel offered by [facility] and includes sequencing, rearrangement, and RNA analysis for the following 76 genes: AIP, ALK, APC, ATM, AXIN2, BAP1, BARD1, BMPR1A, BRCA1, BRCA2, BRIP1, CDC73, CDH1, CDK4, CDKN1B, CDKN2A, CEBPA, CHEK2, CTNNA1, DDX41, DICER1, ETV6, FH, FLCN, GATA2, LZTR1, MAX, MBD4, MEN1, MET, MLH1, MSH2, MSH3, MSH6, MUTYH, NF1, NF2, NTHL1, PALB2, PHOX2B, PMS2, POT1, PRKAR1A, PTCH1, PTEN, RAD51C, RAD51D, RB1, RET, RUNX1, SDHA, SDHAF2, SDHB, SDHC, SDHD, SMAD4, SMARCA4, SMARCB1, SMARCE1, STK11, SUFU, TMEM127, TP53, TSC1, TSC2, VHL, and WT1 (sequencing and deletion/duplication); EGFR, HOXB13, KIT, MITF, PDGFRA, POLD1, and POLE (sequencing only); EPCAM and GREM1 (deletion/duplication only). Report date 03/19/23.    03/16/2023 Imaging   CT CAP with pancreatic protocol and CTA imaging - done [facility] Impression:  1. Local advanced infiltrative mass centered within the pancreatic head with  extensive vascular involvement, as above.   2. A 7 mm  left upper lobe pulmonary nodule is indeterminate.  3. Moderate biliary dilation with obstruction of the level of the extra hepatic bile ducts and possibly owing to obstruction from the pancreatic head mass and/or inflammatory stricture.   4. Multiple pancreatic collections, largest anterior to the pancreatic body, possibly related to prior pancreatitis.  5. Ovoid nodule within the superior right breast is indeterminant. Consider further evaluation with dedicated mammogram as clinically appropriate for this patient.        Discussed the use of AI scribe software for clinical note transcription with the patient, who gave verbal consent to proceed.  History of Present Illness Kathryn Underwood, a 63 year old patient with a known history of pancreatic cancer, presents for follow-up after recently completing radiation therapy. The patient is currently experiencing hyperbilirubinemia, evident by her yellowish skin color. The patient reports no new symptoms or changes in her condition since her last visit two days ago. The patient's cancer has previously been noted to invade the duodenum and the bile duct, which is likely contributing to the current hyperbilirubinemia. The patient's diet has recently included a significant amount of birthday cake and whole milk, but it is not believed to be contributing to the current jaundice. The patient is taking multivitamins and is compliant with her medication regimen.     All other systems were reviewed with the patient and are negative.  MEDICAL HISTORY:  Past Medical History:  Diagnosis Date   Alcohol abuse    Anxiety    Chickenpox    Depression    Neuromuscular disorder (HCC)    neuropathy   Pancreatic cancer (HCC)    Substance abuse (HCC)     SURGICAL HISTORY: Past Surgical History:  Procedure Laterality Date   BIOPSY  12/10/2022   Procedure: BIOPSY;  Surgeon: Mansouraty, Netty Starring., MD;   Location: [facility];  Service: Gastroenterology;;   CESAREAN SECTION     DUODENAL STENT PLACEMENT N/A 01/06/2023   Procedure: DUODENAL STENT PLACEMENT;  Surgeon: Lemar Lofty., MD;  Location: [facility];  Service: Gastroenterology;  Laterality: N/A;   ESOPHAGOGASTRODUODENOSCOPY N/A 08/14/2021   Procedure: ESOPHAGOGASTRODUODENOSCOPY (EGD);  Surgeon: Rachael Fee, MD;  Location: [facility];  Service: Gastroenterology;  Laterality: N/A;   ESOPHAGOGASTRODUODENOSCOPY N/A 12/12/2022   Procedure: ESOPHAGOGASTRODUODENOSCOPY (EGD);  Surgeon: Lemar Lofty., MD;  Location: [facility];  Service: Gastroenterology;  Laterality: N/A;   ESOPHAGOGASTRODUODENOSCOPY (EGD) WITH PROPOFOL N/A 12/10/2022   Procedure: ESOPHAGOGASTRODUODENOSCOPY (EGD) WITH PROPOFOL;  Surgeon: Meridee Score Netty Starring., MD;  Location: [facility];  Service: Gastroenterology;  Laterality: N/A;   ESOPHAGOGASTRODUODENOSCOPY (EGD) WITH PROPOFOL N/A 01/06/2023   Procedure: ESOPHAGOGASTRODUODENOSCOPY (EGD) WITH PROPOFOL;  Surgeon: Meridee Score Netty Starring., MD;  Location: [facility];  Service: Gastroenterology;  Laterality: N/A;  with duodenal stent placement   EUS N/A 08/14/2021   Procedure: UPPER ENDOSCOPIC ULTRASOUND (EUS) RADIAL;  Surgeon: Rachael Fee, MD;  Location: [facility];  Service: Gastroenterology;  Laterality: N/A;   EUS N/A 12/12/2022   Procedure: UPPER ENDOSCOPIC ULTRASOUND (EUS) LINEAR;  Surgeon: Lemar Lofty., MD;  Location: [facility];  Service: Gastroenterology;  Laterality: N/A;   EUS N/A 01/06/2023   Procedure: FULL UPPER ENDOSCOPIC ULTRASOUND (EUS) RADIAL;  Surgeon: Meridee Score Netty Starring., MD;  Location: [facility];  Service: Gastroenterology;  Laterality: N/A;   FINE NEEDLE ASPIRATION N/A 08/14/2021   Procedure: FINE NEEDLE ASPIRATION (FNA) LINEAR;  Surgeon: Rachael Fee, MD;  Location: [facility];  Service: Gastroenterology;  Laterality: N/A;   FINE NEEDLE  ASPIRATION N/A 12/12/2022  Procedure: FINE NEEDLE ASPIRATION (FNA) LINEAR;  Surgeon: Lemar Lofty., MD;  Location: [facility];  Service: Gastroenterology;  Laterality: N/A;   FINE NEEDLE ASPIRATION  01/06/2023   Procedure: FINE NEEDLE ASPIRATION;  Surgeon: Lemar Lofty., MD;  Location: [facility];  Service: Gastroenterology;;   IR CHOLANGIOGRAM EXISTING TUBE  04/29/2023   IR EXCHANGE BILIARY DRAIN  01/29/2023   IR EXCHANGE BILIARY DRAIN  03/05/2023   IR EXCHANGE BILIARY DRAIN  04/16/2023   IR IMAGING GUIDED PORT INSERTION  12/18/2022   IR PERC CHOLECYSTOSTOMY  12/13/2022   TONSILLECTOMY      I have reviewed the social history and family history with the patient and they are unchanged from previous note.  ALLERGIES:  has no known allergies.  MEDICATIONS:  Current Outpatient Medications  Medication Sig Dispense Refill   ELIQUIS 5 MG TABS tablet TAKE 1 TABLET BY MOUTH TWICE A DAY 60 tablet 1   Ensure Max Protein (ENSURE MAX PROTEIN) LIQD Take 330 mLs (11 oz total) by mouth 2 (two) times daily.     famotidine (PEPCID) 20 MG tablet Take 1 tablet (20 mg total) by mouth 2 (two) times daily. 60 tablet 3   furosemide (LASIX) 20 MG tablet TAKE 1 TABLET (20 MG TOTAL) BY MOUTH DAILY. FOR 5 DAYS THEN AS NEEDED FOR LEG EDEMA 20 tablet 0   gabapentin (NEURONTIN) 300 MG capsule Take 2 capsules (600 mg total) by mouth 2 (two) times daily. 120 capsule 3   lidocaine-prilocaine (EMLA) cream Apply to affected area once 30 g 3   mirtazapine (REMERON) 45 MG tablet Take 1 tablet (45 mg total) by mouth at bedtime. 30 tablet 3   ondansetron (ZOFRAN) 8 MG tablet Take 1 tablet (8 mg total) by mouth every 8 (eight) hours as needed for nausea or vomiting. 30 tablet 1   Oxycodone HCl 10 MG TABS Take 1 tablet (10 mg total) by mouth every 4 (four) hours as needed. 90 tablet 0   potassium chloride (KLOR-CON M) 10 MEQ tablet TAKE 1 TABLET (10 MEQ TOTAL) BY MOUTH DAILY. WHEN YOU TAKE LASIX 20 tablet 0    prochlorperazine (COMPAZINE) 10 MG tablet Take 1 tablet (10 mg total) by mouth every 6 (six) hours as needed for nausea or vomiting. 30 tablet 1   QUEtiapine (SEROQUEL) 100 MG tablet Take 2 tablets (200 mg total) by mouth at bedtime. 30 tablet 3   sodium chloride flush (NS) 0.9 % SOLN Use 5 - 10 mLs to flush abdominal drain once daily as directed 300 mL 3   sucralfate (CARAFATE) 1 GM/10ML suspension TAKE 10 MLS (1 G TOTAL) BY MOUTH 4 (FOUR) TIMES DAILY - WITH MEALS AND AT BEDTIME. 420 mL 1   Vitamin D, Ergocalciferol, (DRISDOL) 1.25 MG (50000 UNIT) CAPS capsule Take 1 capsule (50,000 Units total) by mouth every 7 (seven) days. 5 capsule 6   No current facility-administered medications for this visit.    PHYSICAL EXAMINATION: ECOG PERFORMANCE STATUS: 2 - Symptomatic, <50% confined to bed  Vitals:   05/06/23 1003  BP: 110/62  Pulse: 94  Resp: 16  Temp: (!) 97.5 F (36.4 C)  SpO2: 96%   Wt Readings from Last 3 Encounters:  05/04/23 109 lb 8 oz (49.7 kg)  04/29/23 108 lb 11.2 oz (49.3 kg)  04/21/23 110 lb 11.2 oz (50.2 kg)     GENERAL:alert, no distress and comfortable SKIN: skin color, texture, turgor are normal, no rashes or significant lesions EYES: normal, Conjunctiva are pink  and non-injected, sclera icterus  NECK: supple, thyroid normal size, non-tender, without nodularity Musculoskeletal:no cyanosis of digits and no clubbing  NEURO: alert & oriented x 3 with fluent speech, no focal motor/sensory deficits  Physical Exam    LABORATORY DATA:  I have reviewed the data as listed    Latest Ref Rng & Units 05/05/2023    9:48 AM 04/28/2023    8:27 AM 04/21/2023    9:55 AM  CBC  WBC 4.0 - 10.5 K/uL 9.4  10.6  15.6   Hemoglobin 12.0 - 15.0 g/dL 16.1  09.6  04.5   Hematocrit 36.0 - 46.0 % 28.7  32.1  32.6   Platelets 150 - 400 K/uL 389  409  778         Latest Ref Rng & Units 05/05/2023    9:48 AM 05/03/2023   10:00 AM 04/28/2023    8:27 AM  CMP  Glucose 70 - 99 mg/dL 409  811   914   BUN 8 - 23 mg/dL 11  10  16    Creatinine 0.44 - 1.00 mg/dL <7.82  9.56  2.13   Sodium 135 - 145 mmol/L 132  135  137   Potassium 3.5 - 5.1 mmol/L 4.1  3.8  4.1   Chloride 98 - 111 mmol/L 96  101  102   CO2 22 - 32 mmol/L 28  29  30    Calcium 8.9 - 10.3 mg/dL 8.7  9.0  8.9   Total Protein 6.5 - 8.1 g/dL 6.5  6.4  6.2   Total Bilirubin 0.0 - 1.2 mg/dL 0.0 - 1.2 mg/dL 08.6    57.8  8.9  4.5   Alkaline Phos 38 - 126 U/L 769  833  973   AST 15 - 41 U/L 186  177  191   ALT 0 - 44 U/L 163  165  154       RADIOGRAPHIC STUDIES: I have personally reviewed the radiological images as listed and agreed with the findings in the report. No results found.    No orders of the defined types were placed in this encounter.  All questions were answered. The patient knows to call the clinic with any problems, questions or concerns. No barriers to learning was detected. The total time spent in the appointment was 25 minutes.     Malachy Mood, MD 05/06/2023

## 2023-05-06 NOTE — H&P (Signed)
 History and Physical  Kathryn Underwood:[id number] DOB: 1960-11-12 DOA: 05/06/2023  PCP: Bary Leriche, PA-C   Chief Complaint: Bilirubinemia  HPI: Kathryn Underwood is a 63 y.o. 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  Patient does have an IR placed cholecystostomy tube, however over the last few days she has been having worsening bilirubinemia.  This is essentially asymptomatic.  She has some chronic abdominal distention, which she states is stable.  She denies any fevers, or worsening pain from her baseline.  Due to worsening hyperbilirubinemia and bile duct obstruction from presumed tumor invasion, her oncologist Dr. Parke Poisson discussed with IR, who plans PTC.  She was admitted to the hospital service today anticipation of biliary drainage.  Review of Systems: Please see HPI for pertinent positives and negatives. A complete 10 system review of systems are otherwise negative.  Past Medical History:  Diagnosis Date   Alcohol abuse    Anxiety    Chickenpox    Depression    Neuromuscular disorder (HCC)    neuropathy   Pancreatic cancer (HCC)    Substance abuse [facility])    Past Surgical History:  Procedure Laterality Date   BIOPSY  12/10/2022   Procedure: BIOPSY;  Surgeon: Mansouraty, Netty Starring., MD;  Location: [facility];  Service: Gastroenterology;;   CESAREAN SECTION     DUODENAL STENT PLACEMENT N/A 01/06/2023   Procedure: DUODENAL STENT PLACEMENT;  Surgeon: Lemar Lofty., MD;  Location: [facility];  Service: Gastroenterology;  Laterality: N/A;   ESOPHAGOGASTRODUODENOSCOPY N/A 08/14/2021   Procedure: ESOPHAGOGASTRODUODENOSCOPY (EGD);  Surgeon: Rachael Fee, MD;  Location: [facility];  Service: Gastroenterology;  Laterality: N/A;   ESOPHAGOGASTRODUODENOSCOPY N/A 12/12/2022    Procedure: ESOPHAGOGASTRODUODENOSCOPY (EGD);  Surgeon: Lemar Lofty., MD;  Location: [facility];  Service: Gastroenterology;  Laterality: N/A;   ESOPHAGOGASTRODUODENOSCOPY (EGD) WITH PROPOFOL N/A 12/10/2022   Procedure: ESOPHAGOGASTRODUODENOSCOPY (EGD) WITH PROPOFOL;  Surgeon: Meridee Score Netty Starring., MD;  Location: [facility];  Service: Gastroenterology;  Laterality: N/A;   ESOPHAGOGASTRODUODENOSCOPY (EGD) WITH PROPOFOL N/A 01/06/2023   Procedure: ESOPHAGOGASTRODUODENOSCOPY (EGD) WITH PROPOFOL;  Surgeon: Meridee Score Netty Starring., MD;  Location: [facility];  Service: Gastroenterology;  Laterality: N/A;  with duodenal stent placement   EUS N/A 08/14/2021   Procedure: UPPER ENDOSCOPIC ULTRASOUND (EUS) RADIAL;  Surgeon: Rachael Fee, MD;  Location: [facility];  Service: Gastroenterology;  Laterality: N/A;   EUS N/A 12/12/2022   Procedure: UPPER ENDOSCOPIC ULTRASOUND (EUS) LINEAR;  Surgeon: Lemar Lofty., MD;  Location: [facility];  Service: Gastroenterology;  Laterality: N/A;   EUS N/A 01/06/2023   Procedure: FULL UPPER ENDOSCOPIC ULTRASOUND (EUS) RADIAL;  Surgeon: Meridee Score Netty Starring., MD;  Location: [facility];  Service: Gastroenterology;  Laterality: N/A;   FINE NEEDLE ASPIRATION N/A 08/14/2021   Procedure: FINE NEEDLE ASPIRATION (FNA) LINEAR;  Surgeon: Rachael Fee, MD;  Location: [facility];  Service: Gastroenterology;  Laterality: N/A;   FINE NEEDLE ASPIRATION N/A 12/12/2022   Procedure: FINE NEEDLE ASPIRATION (FNA) LINEAR;  Surgeon: Lemar Lofty., MD;  Location: [facility];  Service: Gastroenterology;  Laterality: N/A;   FINE NEEDLE ASPIRATION  01/06/2023   Procedure: FINE NEEDLE ASPIRATION;  Surgeon: Lemar Lofty., MD;  Location: [facility];  Service: Gastroenterology;;   IR CHOLANGIOGRAM EXISTING TUBE  04/29/2023   IR EXCHANGE BILIARY DRAIN  01/29/2023   IR EXCHANGE BILIARY DRAIN  03/05/2023  IR EXCHANGE BILIARY DRAIN  04/16/2023    IR IMAGING GUIDED PORT INSERTION  12/18/2022   IR PERC CHOLECYSTOSTOMY  12/13/2022   TONSILLECTOMY     Social History:  reports that she has been smoking cigarettes. She has never used smokeless tobacco. She reports that she does not currently use alcohol after a past usage of about 16.0 standard drinks of alcohol per week. She reports that she does not currently use drugs after having used the following drugs: Cocaine and Marijuana.  No Known Allergies  Family History  Problem Relation Age of Onset   Dementia Mother    Arthritis Father    Asthma Father    Asthma Brother    Diabetes Maternal Grandmother    Dementia Maternal Grandmother    Diabetes Paternal Grandmother    Parkinson's disease Maternal Grandfather    Parkinson's disease Paternal Grandfather    Breast cancer Neg Hx    Colon cancer Neg Hx      Prior to Admission medications   Medication Sig Start Date End Date Taking? Authorizing Provider  ELIQUIS 5 MG TABS tablet TAKE 1 TABLET BY MOUTH TWICE A DAY 04/09/23   [name], NP  Ensure Max Protein (ENSURE MAX PROTEIN) LIQD Take 330 mLs (11 oz total) by mouth 2 (two) times daily. 12/18/22   Almon Hercules, MD  famotidine (PEPCID) 20 MG tablet Take 1 tablet (20 mg total) by mouth 2 (two) times daily. 04/21/23   Pickenpack-Cousar, Arty Baumgartner, NP  furosemide (LASIX) 20 MG tablet TAKE 1 TABLET (20 MG TOTAL) BY MOUTH DAILY. FOR 5 DAYS THEN AS NEEDED FOR LEG EDEMA 04/19/23   Malachy Mood, MD  gabapentin (NEURONTIN) 300 MG capsule Take 2 capsules (600 mg total) by mouth 2 (two) times daily. 03/24/23   Pickenpack-Cousar, Arty Baumgartner, NP  lidocaine-prilocaine (EMLA) cream Apply to affected area once 12/28/22   Malachy Mood, MD  mirtazapine (REMERON) 45 MG tablet Take 1 tablet (45 mg total) by mouth at bedtime. 03/24/23   Pickenpack-Cousar, Arty Baumgartner, NP  ondansetron (ZOFRAN) 8 MG tablet Take 1 tablet (8 mg total) by mouth every 8 (eight) hours as needed for nausea or vomiting. 12/28/22   Malachy Mood, MD  Oxycodone HCl 10 MG TABS Take 1 tablet (10 mg total) by mouth every 4 (four) hours as needed. 04/23/23   Pickenpack-Cousar, Arty Baumgartner, NP  potassium chloride (KLOR-CON M) 10 MEQ tablet TAKE 1 TABLET (10 MEQ TOTAL) BY MOUTH DAILY. WHEN YOU TAKE LASIX 04/19/23   Malachy Mood, MD  prochlorperazine (COMPAZINE) 10 MG tablet Take 1 tablet (10 mg total) by mouth every 6 (six) hours as needed for nausea or vomiting. 12/28/22   Malachy Mood, MD  QUEtiapine (SEROQUEL) 100 MG tablet Take 2 tablets (200 mg total) by mouth at bedtime. 03/24/23   Pickenpack-Cousar, Arty Baumgartner, NP  sodium chloride flush (NS) 0.9 % SOLN Use 5 - 10 mLs to flush abdominal drain once daily as directed 12/18/22   Allred, Darrell K, PA-C  sucralfate (CARAFATE) 1 GM/10ML suspension TAKE 10 MLS (1 G TOTAL) BY MOUTH 4 (FOUR) TIMES DAILY - WITH MEALS AND AT BEDTIME. 03/18/23   Allwardt, Alyssa M, PA-C  Vitamin D, Ergocalciferol, (DRISDOL) 1.25 MG (50000 UNIT) CAPS capsule Take 1 capsule (50,000 Units total) by mouth every 7 (seven) days. 01/02/23   Alwyn Ren, MD    Physical Exam: BP 113/67 (BP Location: Right Arm)   Pulse 92   Temp 98.6 F (37 C) (Oral)  Resp 16   SpO2 95%  General:  Alert, oriented, calm, in no acute distress, thin female who is jaundiced Eyes: EOMI, clear conjuctivae, icteric sclera Neck: supple, no masses, trachea mildline  Cardiovascular: RRR, no murmurs or rubs, no peripheral edema  Respiratory: clear to auscultation bilaterally, no wheezes, no crackles  Abdomen: soft, minimally tender, distended, normal bowel tones heard  Skin: dry, no rashes  Musculoskeletal: no joint effusions, normal range of motion  Psychiatric: appropriate affect, normal speech  Neurologic: extraocular muscles intact, clear speech, moving all extremities with intact sensorium         Labs on Admission:  Basic Metabolic Panel: Recent Labs  Lab 05/03/23 1000 05/05/23 0948  NA 135 132*  K 3.8 4.1  CL 101 96*  CO2 29 28   GLUCOSE 147* 144*  BUN 10 11  CREATININE 0.39* <0.30*  CALCIUM 9.0 8.7*   Liver Function Tests: Recent Labs  Lab 05/03/23 1000 05/05/23 0948  AST 177* 186*  ALT 165* 163*  ALKPHOS 833* 769*  BILITOT 8.9* 10.3*  10.3*  PROT 6.4* 6.5  ALBUMIN 3.4* 2.9*   No results for input(s): "LIPASE", "AMYLASE" in the last 168 hours. No results for input(s): "AMMONIA" in the last 168 hours. CBC: Recent Labs  Lab 05/05/23 0948  WBC 9.4  NEUTROABS 6.9  HGB 10.3*  HCT 28.7*  MCV 88.6  PLT 389   Cardiac Enzymes: No results for input(s): "CKTOTAL", "CKMB", "CKMBINDEX", "TROPONINI" in the last 168 hours. BNP (last 3 results) No results for input(s): "BNP" in the last 8760 hours.  ProBNP (last 3 results) No results for input(s): "PROBNP" in the last 8760 hours.  CBG: No results for input(s): "GLUCAP" in the last 168 hours.  Radiological Exams on Admission: No results found.  Assessment/Plan SHADAYA MARSCHNER is a 63 y.o. 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  Worsening jaundice-presumably due to obstruction related to liver masses. -Inpatient admission -N.p.o. after midnight -IR consulted for PTC placement  Pancreatic adenocarcinoma-under the care of Dr. 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  Will hold this for the time being in anticipation of invasive procedure.  Chronic anemia-stable  DVT prophylaxis: SCDs only    Code Status: Limited: Do not attempt resuscitation (DNR) -DNR-LIMITED -Do Not Intubate/DNI   Consults called: None  Admission status: The appropriate patient status for this patient is INPATIENT. Inpatient status is judged to be reasonable and necessary in order to provide the required  intensity of service to ensure the patient's safety. The patient's presenting symptoms, physical exam findings, and initial radiographic and laboratory data in the context of their chronic comorbidities is felt to place them at high risk for further clinical deterioration. Furthermore, it is not anticipated that the patient will be medically stable for discharge from the hospital within 2 midnights of admission.    I certify that at the point of admission it is my clinical judgment that the patient will require inpatient hospital care spanning beyond 2 midnights from the point of admission due to high intensity of service, high risk for further deterioration and high frequency of surveillance required  Time spent: 59 minutes  Jacobus Colvin Sharlette Dense MD [facility] Pager [phone]  If 7PM-7AM, please contact night-coverage [email or web address] Password [facility]  05/06/2023, 3:19 PM

## 2023-05-07 ENCOUNTER — Ambulatory Visit

## 2023-05-07 ENCOUNTER — Inpatient Hospital Stay (HOSPITAL_COMMUNITY)

## 2023-05-07 ENCOUNTER — Other Ambulatory Visit: Payer: Self-pay

## 2023-05-07 DIAGNOSIS — K831 Obstruction of bile duct: Secondary | ICD-10-CM | POA: Diagnosis not present

## 2023-05-07 DIAGNOSIS — C801 Malignant (primary) neoplasm, unspecified: Secondary | ICD-10-CM | POA: Diagnosis not present

## 2023-05-07 HISTORY — PX: IR INT EXT BILIARY DRAIN WITH CHOLANGIOGRAM: IMG6044

## 2023-05-07 LAB — COMPREHENSIVE METABOLIC PANEL WITH GFR
ALT: 149 U/L — ABNORMAL HIGH (ref 0–44)
AST: 193 U/L — ABNORMAL HIGH (ref 15–41)
Albumin: 2.6 g/dL — ABNORMAL LOW (ref 3.5–5.0)
Alkaline Phosphatase: 714 U/L — ABNORMAL HIGH (ref 38–126)
Anion gap: 6 (ref 5–15)
BUN: 10 mg/dL (ref 8–23)
CO2: 29 mmol/L (ref 22–32)
Calcium: 9 mg/dL (ref 8.9–10.3)
Chloride: 99 mmol/L (ref 98–111)
Creatinine, Ser: <0.3 mg/dL — ABNORMAL LOW (ref 0.44–1.00)
Glucose, Bld: 102 mg/dL — ABNORMAL HIGH (ref 70–99)
Potassium: 4.1 mmol/L (ref 3.5–5.1)
Sodium: 134 mmol/L — ABNORMAL LOW (ref 135–145)
Total Bilirubin: 10.5 mg/dL — ABNORMAL HIGH (ref 0.0–1.2)
Total Protein: 6 g/dL — ABNORMAL LOW (ref 6.5–8.1)

## 2023-05-07 LAB — CBC
HCT: 28.6 % — ABNORMAL LOW (ref 36.0–46.0)
Hemoglobin: 9.6 g/dL — ABNORMAL LOW (ref 12.0–15.0)
MCH: 31.8 pg (ref 26.0–34.0)
MCHC: 33.6 g/dL (ref 30.0–36.0)
MCV: 94.7 fL (ref 80.0–100.0)
Platelets: 347 10*3/uL (ref 150–400)
RBC: 3.02 MIL/uL — ABNORMAL LOW (ref 3.87–5.11)
RDW: 24.8 % — ABNORMAL HIGH (ref 11.5–15.5)
WBC: 9.1 10*3/uL (ref 4.0–10.5)
nRBC: 0 % (ref 0.0–0.2)

## 2023-05-07 LAB — PROTIME-INR
INR: 1.4 — ABNORMAL HIGH (ref 0.8–1.2)
Prothrombin Time: 17.1 s — ABNORMAL HIGH (ref 11.4–15.2)

## 2023-05-07 MED ORDER — SODIUM CHLORIDE 0.9% FLUSH
10.0000 mL | Freq: Two times a day (BID) | INTRAVENOUS | Status: DC
  Administered 2023-05-07 – 2023-05-09 (×5): 10 mL

## 2023-05-07 MED ORDER — MIDAZOLAM HCL 2 MG/2ML IJ SOLN
INTRAMUSCULAR | Status: AC | PRN
  Administered 2023-05-07: 1 mg via INTRAVENOUS

## 2023-05-07 MED ORDER — SODIUM CHLORIDE 0.9% FLUSH: 10.0000 mL | INTRAVENOUS | Status: DC | PRN

## 2023-05-07 MED ORDER — FENTANYL CITRATE (PF) 100 MCG/2ML IJ SOLN
INTRAMUSCULAR | Status: AC
  Filled 2023-05-07: qty 2

## 2023-05-07 MED ORDER — MIDAZOLAM HCL 2 MG/2ML IJ SOLN
INTRAMUSCULAR | Status: AC | PRN
  Administered 2023-05-07: .5 mg via INTRAVENOUS

## 2023-05-07 MED ORDER — MIDAZOLAM HCL 2 MG/2ML IJ SOLN
INTRAMUSCULAR | Status: AC
  Filled 2023-05-07: qty 2

## 2023-05-07 MED ORDER — LIDOCAINE HCL 1 % IJ SOLN
20.0000 mL | Freq: Once | INTRAMUSCULAR | Status: AC
  Administered 2023-05-07: 4 mL via INTRADERMAL

## 2023-05-07 MED ORDER — FENTANYL CITRATE (PF) 100 MCG/2ML IJ SOLN
INTRAMUSCULAR | Status: AC | PRN
  Administered 2023-05-07: 50 ug via INTRAVENOUS

## 2023-05-07 MED ORDER — CHLORHEXIDINE GLUCONATE CLOTH 2 % EX PADS
6.0000 | MEDICATED_PAD | Freq: Every day | CUTANEOUS | Status: DC
  Administered 2023-05-07 – 2023-05-09 (×3): 6 via TOPICAL

## 2023-05-07 MED ORDER — FENTANYL CITRATE (PF) 100 MCG/2ML IJ SOLN
INTRAMUSCULAR | Status: AC | PRN
  Administered 2023-05-07: 25 ug via INTRAVENOUS

## 2023-05-07 MED ORDER — IOHEXOL 300 MG/ML  SOLN
50.0000 mL | Freq: Once | INTRAMUSCULAR | Status: AC | PRN
  Administered 2023-05-07: 20 mL

## 2023-05-07 MED ORDER — LIDOCAINE HCL 1 % IJ SOLN
INTRAMUSCULAR | Status: AC
  Filled 2023-05-07: qty 20

## 2023-05-07 MED ORDER — SODIUM CHLORIDE 0.9% FLUSH
5.0000 mL | Freq: Three times a day (TID) | INTRAVENOUS | Status: DC
  Administered 2023-05-07 – 2023-05-09 (×6): 5 mL

## 2023-05-07 NOTE — Progress Notes (Signed)
 PROGRESS NOTE  Kathryn Underwood:[id number] DOB: November 26, 1960 DOA: 05/06/2023 PCP: Allwardt, Crist Infante, PA-C   LOS: 1 day   Brief Narrative / Interim history:  63 y.o. 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  Patient does have an IR placed cholecystostomy tube last year, but despite this she has been having worsening bilirubin and increased generalized pruritus.  She was admitted from the cancer center for IR consultation and revision of her biliary drain  Subjective / 24h Interval events: She is doing well overall, complains of generalized itching  Assesement and Plan: Principal problem Unresectable pancreatic adenocarcinoma, biliary obstruction-oncology following closely as an outpatient, she received chemotherapy and currently is on radiation.  She is here with worsening jaundice and worsening biliary ductal dilatation on the CT scan.  She already has a biliary drain, will likely need to be revised, IR consulted, appreciate input.  N.p.o., she will have a procedure later today.  Advance diet as tolerated following the procedure, repeat LFTs tomorrow morning  Active problems Splenic vein thrombosis-continue Eliquis following the procedure, now on hold  Chronic anemia -in the setting of underlying malignancy  Elevated LFTs-due to #1  Hyponatremia-mild  Scheduled Meds:  Chlorhexidine Gluconate Cloth  6 each Topical Daily   famotidine  20 mg Oral BID   influenza vac split trivalent PF  0.5 mL Intramuscular Tomorrow-1000   mirtazapine  45 mg Oral QHS   pneumococcal 20-valent conjugate vaccine  0.5 mL Intramuscular Tomorrow-1000   QUEtiapine  200 mg Oral QHS   sodium chloride flush  10-40 mL Intracatheter Q12H   Continuous Infusions:  cefOXitin     PRN Meds:.albuterol, HYDROmorphone (DILAUDID)  injection, ibuprofen, ondansetron **OR** ondansetron (ZOFRAN) IV, oxyCODONE, senna-docusate, sodium chloride flush  Current Outpatient Medications  Medication Instructions   Eliquis 5 mg, Oral, 2 times daily   Ensure Max Protein (ENSURE MAX PROTEIN) LIQD 11 oz, Oral, 2 times daily   famotidine (PEPCID) 20 mg, Oral, 2 times daily   furosemide (LASIX) 20 mg, Oral, Daily, For 5 days then as needed for leg edema   gabapentin (NEURONTIN) 600 mg, Oral, 2 times daily   lidocaine-prilocaine (EMLA) cream Apply to affected area once   mirtazapine (REMERON) 45 mg, Oral, Daily at bedtime   Multiple Vitamin (MULTIVITAMIN ADULT PO) 1 tablet, Daily   ondansetron (ZOFRAN) 8 mg, Oral, Every 8 hours PRN   Oxycodone HCl 10 mg, Oral, Every 4 hours PRN   potassium chloride (KLOR-CON M) 10 MEQ tablet 10 mEq, Oral, Daily, When you take lasix   prochlorperazine (COMPAZINE) 10 mg, Oral, Every 6 hours PRN   QUEtiapine (SEROQUEL) 200 mg, Oral, Daily at bedtime   sodium chloride flush (NS) 0.9 % SOLN Use 5 - 10 mLs to flush abdominal drain once daily as directed   sucralfate (CARAFATE) 1 g, Oral, 3 times daily with meals & bedtime   Vitamin D (Ergocalciferol) (DRISDOL) 50,000 Units, Oral, Every 7 days    Diet Orders (From admission, onward)     Start     Ordered   05/07/23 0001  Diet NPO time specified Except for: Sips with Meds  Diet effective midnight       Question:  Except for  Answer:  Sips with Meds   05/06/23 1650            DVT prophylaxis: SCDs Start: 05/06/23 1518  Lab Results  Component Value Date   PLT 347 05/07/2023      Code Status: Limited: Do not attempt resuscitation (DNR) -DNR-LIMITED -Do Not Intubate/DNI   Family Communication: No family at bedside  Status is: Inpatient Remains inpatient appropriate because: severity of illness  Level of care: Med-Surg  Consultants:  IR  Objective: Vitals:   05/06/23 1501 05/06/23 1636 05/06/23 2332 05/07/23 0424  BP: 113/67  121/72  (!) 112/97  Pulse: 92  89 (!) 101  Resp: 16  18 18   Temp: 98.6 F (37 C)  98.3 F (36.8 C) 98 F (36.7 C)  TempSrc: Oral  Oral Oral  SpO2: 95%  95% 97%  Weight:  50 kg  48.7 kg  Height:  5\' 3"  (1.6 m)      Intake/Output Summary (Last 24 hours) at 05/07/2023 0948 Last data filed at 05/06/2023 1900 Gross per 24 hour  Intake 240 ml  Output --  Net 240 ml   Wt Readings from Last 3 Encounters:  05/07/23 48.7 kg  05/04/23 49.7 kg  04/29/23 49.3 kg    Examination:  Constitutional: NAD Eyes: + scleral icterus ENMT: Mucous membranes are moist.  Neck: normal, supple Respiratory: clear to auscultation bilaterally, no wheezing, no crackles.  Cardiovascular: Regular rate and rhythm, no murmurs / rubs / gallops. Abdomen: non distended, no tenderness. Bowel sounds positive.  Musculoskeletal: no clubbing / cyanosis.  Skin: Visibly jaundiced   Data Reviewed: I have independently reviewed following labs and imaging studies   CBC Recent Labs  Lab 05/05/23 0948 05/07/23 0500  WBC 9.4 9.1  HGB 10.3* 9.6*  HCT 28.7* 28.6*  PLT 389 347  MCV 88.6 94.7  MCH 31.8 31.8  MCHC 35.9 33.6  RDW 24.2* 24.8*  LYMPHSABS 0.8  --   MONOABS 1.2*  --   EOSABS 0.4  --   BASOSABS 0.1  --     Recent Labs  Lab 05/03/23 1000 05/05/23 0948 05/07/23 0500  NA 135 132* 134*  K 3.8 4.1 4.1  CL 101 96* 99  CO2 29 28 29   GLUCOSE 147* 144* 102*  BUN 10 11 10   CREATININE 0.39* <0.30* <0.30*  CALCIUM 9.0 8.7* 9.0  AST 177* 186* 193*  ALT 165* 163* 149*  ALKPHOS 833* 769* 714*  BILITOT 8.9* 10.3*  10.3* 10.5*  ALBUMIN 3.4* 2.9* 2.6*  INR  --   --  1.4*    ------------------------------------------------------------------------------------------------------------------ No results for input(s): "CHOL", "HDL", "LDLCALC", "TRIG", "CHOLHDL", "LDLDIRECT" in the last 72 hours.  Lab Results  Component Value Date   HGBA1C 5.5 12/09/2015    ------------------------------------------------------------------------------------------------------------------ No results for input(s): "TSH", "T4TOTAL", "T3FREE", "THYROIDAB" in the last 72 hours.  Invalid input(s): "FREET3"  Cardiac Enzymes No results for input(s): "CKMB", "TROPONINI", "MYOGLOBIN" in the last 168 hours.  Invalid input(s): "CK" ------------------------------------------------------------------------------------------------------------------    Component Value Date/Time   BNP 42.0 07/24/2021 1703    CBG: No results for input(s): "GLUCAP" in the last 168 hours.  No results found for this or any previous visit (from the past 240 hours).   Radiology Studies: No results found.   Pamella Pert, MD, PhD [facility]  Between 7 am - 7 pm I am available, please contact me via Amion (for emergencies) or Securechat (non urgent messages)  Between 7 pm - 7 am I am not available, please contact night coverage MD/APP via Amion

## 2023-05-07 NOTE — Plan of Care (Signed)
  Problem: Activity: Goal: Risk for activity intolerance will decrease Outcome: Progressing   Problem: Elimination: Goal: Will not experience complications related to bowel motility Outcome: Progressing Goal: Will not experience complications related to urinary retention Outcome: Progressing   Problem: Pain Managment: Goal: General experience of comfort will improve and/or be controlled Outcome: Progressing Note: Pain controlled with PRN medications.    Problem: Safety: Goal: Ability to remain free from injury will improve Outcome: Progressing   Problem: Skin Integrity: Goal: Risk for impaired skin integrity will decrease Outcome: Progressing   Problem: Coping: Goal: Level of anxiety will decrease Outcome: Not Progressing Note: Patient states that she was "hoping to have more time"

## 2023-05-07 NOTE — Procedures (Signed)
 Interventional Radiology Procedure Note  Procedure: Internal/external biliary drain placement with cholangiogram  Complications: None  Estimated Blood Loss: < 10 mL  Findings: After access of a peripheral right lobe intrahepatic bile duct, cholangiogram shows high grade biliary obstruction at level of distal CBC.  10 Fr internal/external biliary drainage catheter able to be placed and advanced through level of obstruction to duodenum. Attached to gravity bag.  Will keep cholecystostomy in to gravity drainage as well.  Jodi Marble. Fredia Sorrow, M.D Pager:  [phone]

## 2023-05-08 DIAGNOSIS — K831 Obstruction of bile duct: Secondary | ICD-10-CM | POA: Diagnosis not present

## 2023-05-08 DIAGNOSIS — C801 Malignant (primary) neoplasm, unspecified: Secondary | ICD-10-CM | POA: Diagnosis not present

## 2023-05-08 LAB — COMPREHENSIVE METABOLIC PANEL WITH GFR
ALT: 118 U/L — ABNORMAL HIGH (ref 0–44)
AST: 79 U/L — ABNORMAL HIGH (ref 15–41)
Albumin: 2.7 g/dL — ABNORMAL LOW (ref 3.5–5.0)
Alkaline Phosphatase: 668 U/L — ABNORMAL HIGH (ref 38–126)
Anion gap: 6 (ref 5–15)
BUN: 17 mg/dL (ref 8–23)
CO2: 28 mmol/L (ref 22–32)
Calcium: 8.8 mg/dL — ABNORMAL LOW (ref 8.9–10.3)
Chloride: 97 mmol/L — ABNORMAL LOW (ref 98–111)
Creatinine, Ser: 0.43 mg/dL — ABNORMAL LOW (ref 0.44–1.00)
GFR, Estimated: >60 mL/min (ref >60)
Glucose, Bld: 124 mg/dL — ABNORMAL HIGH (ref 70–99)
Potassium: 3.9 mmol/L (ref 3.5–5.1)
Sodium: 131 mmol/L — ABNORMAL LOW (ref 135–145)
Total Bilirubin: 4.7 mg/dL — ABNORMAL HIGH (ref 0.0–1.2)
Total Protein: 6.3 g/dL — ABNORMAL LOW (ref 6.5–8.1)

## 2023-05-08 LAB — CBC
HCT: 28 % — ABNORMAL LOW (ref 36.0–46.0)
Hemoglobin: 9.6 g/dL — ABNORMAL LOW (ref 12.0–15.0)
MCH: 32.8 pg (ref 26.0–34.0)
MCHC: 34.3 g/dL (ref 30.0–36.0)
MCV: 95.6 fL (ref 80.0–100.0)
Platelets: 337 10*3/uL (ref 150–400)
RBC: 2.93 MIL/uL — ABNORMAL LOW (ref 3.87–5.11)
RDW: 24.4 % — ABNORMAL HIGH (ref 11.5–15.5)
WBC: 13.4 10*3/uL — ABNORMAL HIGH (ref 4.0–10.5)
nRBC: 0 % (ref 0.0–0.2)

## 2023-05-08 LAB — MAGNESIUM: Magnesium: 2.2 mg/dL (ref 1.7–2.4)

## 2023-05-08 MED ORDER — POLYETHYLENE GLYCOL 3350 17 G PO PACK
17.0000 g | PACK | Freq: Every day | ORAL | Status: DC
  Filled 2023-05-08: qty 1

## 2023-05-08 MED ORDER — SENNOSIDES-DOCUSATE SODIUM 8.6-50 MG PO TABS
2.0000 | ORAL_TABLET | Freq: Two times a day (BID) | ORAL | Status: DC
  Administered 2023-05-08: 2 via ORAL
  Filled 2023-05-08 (×2): qty 2

## 2023-05-08 MED ORDER — OXYCODONE HCL 5 MG PO TABS
10.0000 mg | ORAL_TABLET | ORAL | Status: DC | PRN
  Administered 2023-05-08: 15 mg via ORAL
  Filled 2023-05-08: qty 3

## 2023-05-08 MED ORDER — POLYETHYLENE GLYCOL 3350 17 G PO PACK
17.0000 g | PACK | Freq: Two times a day (BID) | ORAL | Status: DC
  Administered 2023-05-08: 17 g via ORAL
  Filled 2023-05-08: qty 1

## 2023-05-08 MED ORDER — GLYCERIN (LAXATIVE) 2.1 G RE SUPP
1.0000 | Freq: Once | RECTAL | Status: AC
  Administered 2023-05-08: 1 via RECTAL
  Filled 2023-05-08: qty 1

## 2023-05-08 MED ORDER — APIXABAN 5 MG PO TABS
5.0000 mg | ORAL_TABLET | Freq: Two times a day (BID) | ORAL | Status: DC
Start: 2023-05-08 — End: 2023-05-09
  Administered 2023-05-08 – 2023-05-09 (×3): 5 mg via ORAL
  Filled 2023-05-08 (×3): qty 1

## 2023-05-08 MED ORDER — OXYCODONE HCL 5 MG PO TABS
15.0000 mg | ORAL_TABLET | ORAL | Status: DC | PRN
  Administered 2023-05-08: 15 mg via ORAL
  Administered 2023-05-09: 20 mg via ORAL
  Filled 2023-05-08: qty 4
  Filled 2023-05-08: qty 3

## 2023-05-08 MED ORDER — GABAPENTIN 300 MG PO CAPS
600.0000 mg | ORAL_CAPSULE | Freq: Two times a day (BID) | ORAL | Status: DC
  Administered 2023-05-08 – 2023-05-09 (×3): 600 mg via ORAL
  Filled 2023-05-08 (×3): qty 2

## 2023-05-08 NOTE — Plan of Care (Signed)

## 2023-05-08 NOTE — Progress Notes (Signed)
 Chief Complaint: Patient was seen today for Pancreatic cancer, biliary obstruction/hyperbilirubinemia; referred for percutaneous transhepatic cholangiogram with biliary drain placement   Referring Physician(s): Feng,Yan  Supervising Physician: Erica Hau  Patient Status: [facility] - In-pt  Subjective: 4/12: Patient is one day post biliary drain placement. Patient reports mild discomfort at the site. She is tolerating foods and liquids. Denies: chest pain, shortness of breath, nausea, vomiting, diarrhea, abdominal pain, and/or fever.   Objective: Physical Exam: BP 110/66 (BP Location: Left Arm)   Pulse 92   Temp 98.5 F (36.9 C) (Oral)   Resp 18   Ht 5\' 3"  (1.6 m)   Wt 106 lb 14.8 oz (48.5 kg)   SpO2 97%   BMI 18.94 kg/m  Constitutional: Adult female, alert and oriented, INAD. Skin: warm and dry. Two RUQ drains in place. One with clear to yellow tinged output. The other has bilious drainage present. No TTP over insertion sites. Insertion sites are without surrounding erythema or edema. Clean and dry dressing overlies drain sites.  Chest: symmetric rise and fall.  Lungs: No respiratory distress. Breathing comfortably.    Current Facility-Administered Medications:    albuterol (PROVENTIL) (2.5 MG/3ML) 0.083% nebulizer solution 2.5 mg, 2.5 mg, Nebulization, Q2H PRN, Jannette Mend, Mir M, MD   apixaban (ELIQUIS) tablet 5 mg, 5 mg, Oral, BID, Aldona Amel, Costin M, MD, 5 mg at 05/08/23 1037   Chlorhexidine Gluconate Cloth 2 % PADS 6 each, 6 each, Topical, Daily, Chavez, Abigail, NP, 6 each at 05/08/23 1025   famotidine (PEPCID) tablet 20 mg, 20 mg, Oral, BID, Jannette Mend, Mir M, MD, 20 mg at 05/08/23 0930   gabapentin (NEURONTIN) capsule 600 mg, 600 mg, Oral, BID, Aldona Amel, Costin M, MD, 600 mg at 05/08/23 1038   HYDROmorphone (DILAUDID) injection 0.5-1 mg, 0.5-1 mg, Intravenous, Q2H PRN, Jannette Mend, Mir M, MD, 1 mg at 05/08/23 1025   ibuprofen (ADVIL) tablet 400 mg, 400 mg, Oral, Q6H  PRN, Jannette Mend, Mir M, MD   mirtazapine (REMERON) tablet 45 mg, 45 mg, Oral, QHS, Jannette Mend, Mir M, MD, 45 mg at 05/07/23 2138   ondansetron (ZOFRAN) tablet 4 mg, 4 mg, Oral, Q6H PRN **OR** ondansetron (ZOFRAN) injection 4 mg, 4 mg, Intravenous, Q6H PRN, Jannette Mend, Mir M, MD, 4 mg at 05/06/23 1951   oxyCODONE (Oxy IR/ROXICODONE) immediate release tablet 10-15 mg, 10-15 mg, Oral, Q4H PRN, Aldona Amel, Costin M, MD   polyethylene glycol (MIRALAX / GLYCOLAX) packet 17 g, 17 g, Oral, BID, Aldona Amel, Costin M, MD, 17 g at 05/08/23 1019   QUEtiapine (SEROQUEL) tablet 200 mg, 200 mg, Oral, QHS, Jannette Mend, Mir M, MD, 200 mg at 05/07/23 2139   senna-docusate (Senokot-S) tablet 2 tablet, 2 tablet, Oral, BID, Gherghe, Costin M, MD   sodium chloride flush (NS) 0.9 % injection 10-40 mL, 10-40 mL, Intracatheter, Q12H, Bailey Lesser, NP, 10 mL at 05/08/23 0930   sodium chloride flush (NS) 0.9 % injection 10-40 mL, 10-40 mL, Intracatheter, PRN, Bailey Lesser, NP   sodium chloride flush (NS) 0.9 % injection 5 mL, 5 mL, Intracatheter, Q8H, Erica Hau, MD, 5 mL at 05/08/23 0539  Labs: CBC Recent Labs    05/07/23 0500 05/08/23 0558  WBC 9.1 13.4*  HGB 9.6* 9.6*  HCT 28.6* 28.0*  PLT 347 337   BMET Recent Labs    05/07/23 0500 05/08/23 0558  NA 134* 131*  K 4.1 3.9  CL 99 97*  CO2 29 28  GLUCOSE 102* 124*  BUN 10 17  CREATININE <0.30* 0.43*  CALCIUM  9.0 8.8*   LFT Recent Labs    05/08/23 0558  PROT 6.3*  ALBUMIN 2.7*  AST 79*  ALT 118*  ALKPHOS 668*  BILITOT 4.7*   PT/INR Recent Labs    05/07/23 0500  LABPROT 17.1*  INR 1.4*     Studies/Results: IR INT EXT BILIARY DRAIN WITH CHOLANGIOGRAM Result Date: 05/07/2023 INDICATION: Pancreatic carcinoma and progressive biliary obstruction. EXAM: INTERNAL/EXTERNAL BILIARY DRAINAGE CATHETER PLACEMENT WITH CHOLANGIOGRAM MEDICATIONS: 2 g IV cefoxitin; The antibiotic was administered within an appropriate time frame prior to the  initiation of the procedure. ANESTHESIA/SEDATION: Moderate (conscious) sedation was employed during this procedure. A total of Versed 2.0 mg and Fentanyl 75 mcg was administered intravenously by the radiology nurse. Total intra-service moderate Sedation Time: 31 minutes. The patient's level of consciousness and vital signs were monitored continuously by radiology nursing throughout the procedure under my direct supervision. FLUOROSCOPY: Radiation Exposure Index (as provided by the fluoroscopic device): 2.0 mGy Kerma CONTRAST:  20 mL Omnipaque 300 COMPLICATIONS: None immediate. PROCEDURE: Informed written consent was obtained from the patient after a thorough discussion of the procedural risks, benefits and alternatives. All questions were addressed. Maximal Sterile Barrier Technique was utilized including caps, mask, sterile gowns, sterile gloves, sterile drape, hand hygiene and skin antiseptic. Local anesthesia was provided with 1% lidocaine. The abdominal wall was prepped with chlorhexidine. A timeout was performed prior to the initiation of the procedure. Ultrasound was used to localize the liver and bile ducts. Under direct ultrasound guidance, an inferior right lobe peripheral intrahepatic bile duct was punctured with a 21 gauge needle. After stab wishing needle access, a cholangiogram was performed with injection of contrast material via the needle. Fluoroscopic image was saved. A guidewire was advanced via the needle into central bile ducts. A transitional dilator was advanced over the wire. The dilator was removed over a guidewire. A 5 French Kumpe catheter was advanced into the common bile duct. Additional contrast injection was performed. The catheter was further advanced over a hydrophilic guidewire through the common bile duct and into the duodenal lumen. The percutaneous tract was dilated to 10 [place]. A 10 French internal/external biliary drainage catheter was then advanced over the wire. The distal  portion was formed in the duodenum. The biliary drain was injected with contrast and fluoroscopic image saved to confirm drain position. The drain was then secured at the skin with a Prolene retention suture and StatLock device. It was attached to gravity bag drainage. FINDINGS: Cholangiogram confirms a high-grade biliary obstruction with massively dilated intrahepatic bile ducts and common bile duct. After right lobe peripheral biliary access, a 5 French catheter met relative obstruction at the level of the mid common bile duct just below the cystic duct orifice. 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 An internal/external biliary drainage catheter was able to be placed which is formed in the duodenum and with sideholes extending into the right intrahepatic ducts allowing both internal and external biliary drainage. IMPRESSION: High-grade biliary obstruction involving the mid to distal common bile duct. The obstruction was able to be crossed after right lobe peripheral biliary access allowing placement of a 10 French internal/external biliary drainage catheter which is formed in the duodenum and with sideholes extending into right-sided intrahepatic bile ducts. This will be left currently to gravity bag drainage to maximize biliary decompression. Electronically Signed   By: Erica Hau M.D.   On: 05/07/2023  17:22    Assessment/Plan: Pancreatic cancer, biliary obstruction/hyperbilirubinemia; referred for percutaneous transhepatic cholangiogram with biliary drain placement   Drain Location: RUQ x 2 Size: Fr size: 10 Fr x 2  Date of placement: 05/07/22,   Currently to: Drain collection device: gravity 24 hour output:  Output by Drain (mL) 05/06/23 0701 - 05/06/23 1900 05/06/23 1901 - 05/07/23 0700 05/07/23 0701 - 05/07/23 1900 05/07/23 1901 - 05/08/23 0700 05/08/23 0701 - 05/08/23 1054   Biliary Tube Cook slip-coat 10.2 Fr. RUQ   80 825 270    Interval imaging/drain manipulation:  tbd  Current examination: Both drains flushes/aspirates easily.  Insertion sites unremarkable. Suture and stat lock in place. Dressed appropriately.   Plan: Continue TID flushes with 5 cc NS. Record output Q shift. Dressing changes QD or PRN if soiled.  Call IR APP or on call IR MD if difficulty flushing or sudden change in drain output.  Repeat imaging/possible drain injection once output < 10 mL/QD (excluding flush material). Consideration for drain removal if output is < 10 mL/QD (excluding flush material), pending discussion with the providing surgical service.  Discharge planning: Please contact IR APP or on call IR MD prior to patient d/c to ensure appropriate follow up plans are in place. Typically patient will follow up with IR clinic 10-14 days post d/c for repeat imaging/possible drain injection. IR scheduler will contact patient with date/time of appointment. Patient will need to flush drain QD with 5 cc NS, record output QD, dressing changes every 2-3 days or earlier if soiled.   IR will continue to follow - please call with questions or concerns.       LOS: 2 days   I spent a total of 15 minutes in face to face in clinical consultation, greater than 50% of which was counseling/coordinating care for Pancreatic cancer, biliary obstruction/hyperbilirubinemia; referred for percutaneous transhepatic cholangiogram with biliary drain placement   Corney Knighton N Latia Mataya PA-C 05/08/2023 10:50 AM

## 2023-05-08 NOTE — Progress Notes (Signed)
 PROGRESS NOTE  Kathryn Underwood ZOX:[id number] DOB: [birth year] DOA: 05/06/2023 PCP: Alda Amas, PA-C   LOS: 2 days   Brief Narrative / Interim history:  63 y.o. 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  Patient does have an IR placed cholecystostomy tube last year, but despite this she has been having worsening bilirubin and increased generalized pruritus.  She was admitted from the cancer center for IR consultation and revision of her biliary drain  Subjective / 24h Interval events: She is not doing very well this morning, complains of worsening of her back pain, also significant constipation, and discomfort at the site of the biliary drain  Assesement and Plan: Principal problem Unresectable pancreatic adenocarcinoma, biliary obstruction-oncology following closely as an outpatient, she received chemotherapy and currently is on radiation.  She is here with worsening jaundice and worsening biliary ductal dilatation on the CT scan.  She already has a biliary drain, will likely need to be revised, IR consulted, she is status post internal/external biliary drain placement with cholangiogram on 4/11.  LFTs are showing improvement today, bilirubin down to 4 from 10  Active problems Cancer-related pain -patient has been having back pain even at home, but this is slightly in a different location and much worse.  Increase oxycodone, may be related to recent procedure.  I will resume her home gabapentin as well  Constipation-due to narcotics, has been using more medications while here, increase bowel regimen  Splenic vein thrombosis-resume Eliquis  Chronic anemia -in the setting of underlying malignancy  Elevated LFTs-due to #1, improving today  Hyponatremia-mild  Scheduled Meds:  apixaban  5 mg Oral  BID   Chlorhexidine Gluconate Cloth  6 each Topical Daily   famotidine  20 mg Oral BID   gabapentin  600 mg Oral BID   Glycerin (Adult)  1 suppository Rectal Once   mirtazapine  45 mg Oral QHS   polyethylene glycol  17 g Oral BID   QUEtiapine  200 mg Oral QHS   senna-docusate  2 tablet Oral BID   sodium chloride flush  10-40 mL Intracatheter Q12H   sodium chloride flush  5 mL Intracatheter Q8H   Continuous Infusions:   PRN Meds:.albuterol, HYDROmorphone (DILAUDID) injection, ibuprofen, ondansetron **OR** ondansetron (ZOFRAN) IV, oxyCODONE, sodium chloride flush  Current Outpatient Medications  Medication Instructions   Eliquis 5 mg, Oral, 2 times daily   Ensure Max Protein (ENSURE MAX PROTEIN) LIQD 11 oz, Oral, 2 times daily   famotidine (PEPCID) 20 mg, Oral, 2 times daily   furosemide (LASIX) 20 mg, Oral, Daily, For 5 days then as needed for leg edema   gabapentin (NEURONTIN) 600 mg, Oral, 2 times daily   lidocaine-prilocaine (EMLA) cream Apply to affected area once   mirtazapine (REMERON) 45 mg, Oral, Daily at bedtime   Multiple Vitamin (MULTIVITAMIN ADULT PO) 1 tablet, Daily   ondansetron (ZOFRAN) 8 mg, Oral, Every 8 hours PRN   Oxycodone HCl 10 mg, Oral, Every 4 hours PRN   potassium chloride (KLOR-CON M) 10 MEQ tablet 10 mEq, Oral, Daily, When you take lasix   prochlorperazine (COMPAZINE) 10 mg, Oral, Every 6 hours PRN   QUEtiapine (SEROQUEL) 200 mg, Oral, Daily at bedtime   sodium chloride flush (NS) 0.9 % SOLN Use 5 - 10 mLs to flush abdominal drain once daily as directed   sucralfate (CARAFATE) 1 g, Oral,  3 times daily with meals & bedtime   Vitamin D (Ergocalciferol) (DRISDOL) 50,000 Units, Oral, Every 7 days    Diet Orders (From admission, onward)     Start     Ordered   05/07/23 1711  Diet regular Fluid consistency: Thin  Diet effective now       Question:  Fluid consistency:  Answer:  Thin   05/07/23 1710            DVT prophylaxis: SCDs Start: 05/06/23  1518 apixaban (ELIQUIS) tablet 5 mg   Lab Results  Component Value Date   PLT 337 05/08/2023      Code Status: Limited: Do not attempt resuscitation (DNR) -DNR-LIMITED -Do Not Intubate/DNI   Family Communication: No family at bedside  Status is: Inpatient Remains inpatient appropriate because: severity of illness  Level of care: Med-Surg  Consultants:  IR  Objective: Vitals:   05/07/23 1656 05/07/23 1747 05/07/23 2100 05/08/23 0504  BP:  (!) 146/132 116/70 110/66  Pulse: 82 94 94 92  Resp: 13 16 18 18   Temp:  98.2 F (36.8 C) 98.2 F (36.8 C) 98.5 F (36.9 C)  TempSrc:  Oral Oral Oral  SpO2: 98% 93% 94% 97%  Weight:    48.5 kg  Height:        Intake/Output Summary (Last 24 hours) at 05/08/2023 1023 Last data filed at 05/08/2023 1018 Gross per 24 hour  Intake 490 ml  Output 1175 ml  Net -685 ml   Wt Readings from Last 3 Encounters:  05/08/23 48.5 kg  05/04/23 49.7 kg  04/29/23 49.3 kg    Examination:  Constitutional: NAD Eyes: lids and conjunctivae normal, faint scleral icterus ENMT: mmm Neck: normal, supple Respiratory: clear to auscultation bilaterally, no wheezing, no crackles. Normal respiratory effort.  Cardiovascular: Regular rate and rhythm, no murmurs / rubs / gallops. No LE edema. Abdomen: soft, no distention, no tenderness. Bowel sounds positive.    Data Reviewed: I have independently reviewed following labs and imaging studies   CBC Recent Labs  Lab 05/05/23 0948 05/07/23 0500 05/08/23 0558  WBC 9.4 9.1 13.4*  HGB 10.3* 9.6* 9.6*  HCT 28.7* 28.6* 28.0*  PLT 389 347 337  MCV 88.6 94.7 95.6  MCH 31.8 31.8 32.8  MCHC 35.9 33.6 34.3  RDW 24.2* 24.8* 24.4*  LYMPHSABS 0.8  --   --   MONOABS 1.2*  --   --   EOSABS 0.4  --   --   BASOSABS 0.1  --   --     Recent Labs  Lab 05/03/23 1000 05/05/23 0948 05/07/23 0500 05/08/23 0558  NA 135 132* 134* 131*  K 3.8 4.1 4.1 3.9  CL 101 96* 99 97*  CO2 29 28 29 28   GLUCOSE 147* 144*  102* 124*  BUN 10 11 10 17   CREATININE 0.39* <0.30* <0.30* 0.43*  CALCIUM 9.0 8.7* 9.0 8.8*  AST 177* 186* 193* 79*  ALT 165* 163* 149* 118*  ALKPHOS 833* 769* 714* 668*  BILITOT 8.9* 10.3*  10.3* 10.5* 4.7*  ALBUMIN 3.4* 2.9* 2.6* 2.7*  MG  --   --   --  2.2  INR  --   --  1.4*  --     ------------------------------------------------------------------------------------------------------------------ No results for input(s): "CHOL", "HDL", "LDLCALC", "TRIG", "CHOLHDL", "LDLDIRECT" in the last 72 hours.  Lab Results  Component Value Date   HGBA1C 5.5 12/09/2015   ------------------------------------------------------------------------------------------------------------------ No results for input(s): "TSH", "T4TOTAL", "T3FREE", "THYROIDAB" in the last 72  hours.  Invalid input(s): "FREET3"  Cardiac Enzymes No results for input(s): "CKMB", "TROPONINI", "MYOGLOBIN" in the last 168 hours.  Invalid input(s): "CK" ------------------------------------------------------------------------------------------------------------------    Component Value Date/Time   BNP 42.0 07/24/2021 1703    CBG: No results for input(s): "GLUCAP" in the last 168 hours.  No results found for this or any previous visit (from the past 240 hours).   Radiology Studies: IR INT EXT BILIARY DRAIN WITH CHOLANGIOGRAM Result Date: 05/07/2023 INDICATION: Pancreatic carcinoma and progressive biliary obstruction. EXAM: INTERNAL/EXTERNAL BILIARY DRAINAGE CATHETER PLACEMENT WITH CHOLANGIOGRAM MEDICATIONS: 2 g IV cefoxitin; The antibiotic was administered within an appropriate time frame prior to the initiation of the procedure. ANESTHESIA/SEDATION: Moderate (conscious) sedation was employed during this procedure. A total of Versed 2.0 mg and Fentanyl 75 mcg was administered intravenously by the radiology nurse. Total intra-service moderate Sedation Time: 31 minutes. The patient's level of consciousness and vital signs  were monitored continuously by radiology nursing throughout the procedure under my direct supervision. FLUOROSCOPY: Radiation Exposure Index (as provided by the fluoroscopic device): 2.0 mGy Kerma CONTRAST:  20 mL Omnipaque 300 COMPLICATIONS: None immediate. PROCEDURE: Informed written consent was obtained from the patient after a thorough discussion of the procedural risks, benefits and alternatives. All questions were addressed. Maximal Sterile Barrier Technique was utilized including caps, mask, sterile gowns, sterile gloves, sterile drape, hand hygiene and skin antiseptic. Local anesthesia was provided with 1% lidocaine. The abdominal wall was prepped with chlorhexidine. A timeout was performed prior to the initiation of the procedure. Ultrasound was used to localize the liver and bile ducts. Under direct ultrasound guidance, an inferior right lobe peripheral intrahepatic bile duct was punctured with a 21 gauge needle. After stab wishing needle access, a cholangiogram was performed with injection of contrast material via the needle. Fluoroscopic image was saved. A guidewire was advanced via the needle into central bile ducts. A transitional dilator was advanced over the wire. The dilator was removed over a guidewire. A 5 French Kumpe catheter was advanced into the common bile duct. Additional contrast injection was performed. The catheter was further advanced over a hydrophilic guidewire through the common bile duct and into the duodenal lumen. The percutaneous tract was dilated to 10 [place]. A 10 French internal/external biliary drainage catheter was then advanced over the wire. The distal portion was formed in the duodenum. The biliary drain was injected with contrast and fluoroscopic image saved to confirm drain position. The drain was then secured at the skin with a Prolene retention suture and StatLock device. It was attached to gravity bag drainage. FINDINGS: Cholangiogram confirms a high-grade biliary  obstruction with massively dilated intrahepatic bile ducts and common bile duct. After right lobe peripheral biliary access, a 5 French catheter met relative obstruction at the level of the mid common bile duct just below the cystic duct orifice. 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 An internal/external biliary drainage catheter was able to be placed which is formed in the duodenum and with sideholes extending into the right intrahepatic ducts allowing both internal and external biliary drainage. IMPRESSION: High-grade biliary obstruction involving the mid to distal common bile duct. The obstruction was able to be crossed after right lobe peripheral biliary access allowing placement of a 10 French internal/external biliary drainage catheter which is formed in the duodenum and with sideholes extending into right-sided intrahepatic bile ducts. This will be left currently to gravity  bag drainage to maximize biliary decompression. Electronically Signed   By: Erica Hau M.D.   On: 05/07/2023 17:22     Kathlen Para, MD, PhD [facility]  Between 7 am - 7 pm I am available, please contact me via Amion (for emergencies) or Securechat (non urgent messages)  Between 7 pm - 7 am I am not available, please contact night coverage MD/APP via Amion

## 2023-05-08 NOTE — Plan of Care (Signed)

## 2023-05-09 DIAGNOSIS — C801 Malignant (primary) neoplasm, unspecified: Secondary | ICD-10-CM | POA: Diagnosis not present

## 2023-05-09 DIAGNOSIS — K831 Obstruction of bile duct: Secondary | ICD-10-CM | POA: Diagnosis not present

## 2023-05-09 LAB — COMPREHENSIVE METABOLIC PANEL WITH GFR
ALT: 73 U/L — ABNORMAL HIGH (ref 0–44)
AST: 29 U/L (ref 15–41)
Albumin: 2.7 g/dL — ABNORMAL LOW (ref 3.5–5.0)
Alkaline Phosphatase: 522 U/L — ABNORMAL HIGH (ref 38–126)
Anion gap: 7 (ref 5–15)
BUN: 19 mg/dL (ref 8–23)
CO2: 27 mmol/L (ref 22–32)
Calcium: 8.8 mg/dL — ABNORMAL LOW (ref 8.9–10.3)
Chloride: 95 mmol/L — ABNORMAL LOW (ref 98–111)
Creatinine, Ser: 0.42 mg/dL — ABNORMAL LOW (ref 0.44–1.00)
GFR, Estimated: >60 mL/min (ref >60)
Glucose, Bld: 140 mg/dL — ABNORMAL HIGH (ref 70–99)
Potassium: 3.5 mmol/L (ref 3.5–5.1)
Sodium: 129 mmol/L — ABNORMAL LOW (ref 135–145)
Total Bilirubin: 4.1 mg/dL — ABNORMAL HIGH (ref 0.0–1.2)
Total Protein: 6.3 g/dL — ABNORMAL LOW (ref 6.5–8.1)

## 2023-05-09 LAB — MAGNESIUM: Magnesium: 1.9 mg/dL (ref 1.7–2.4)

## 2023-05-09 MED ORDER — MORPHINE SULFATE ER 30 MG PO TBCR: 30.0000 mg | EXTENDED_RELEASE_TABLET | Freq: Two times a day (BID) | ORAL | 0 refills | Status: AC

## 2023-05-09 MED ORDER — HEPARIN SOD (PORK) LOCK FLUSH 100 UNIT/ML IV SOLN
500.0000 [IU] | INTRAVENOUS | Status: DC | PRN
  Filled 2023-05-09: qty 5

## 2023-05-09 MED ORDER — OXYCODONE HCL 10 MG PO TABS: 15.0000 mg | ORAL_TABLET | ORAL | 0 refills | Status: DC | PRN

## 2023-05-09 MED ORDER — MORPHINE SULFATE ER 15 MG PO TBCR
30.0000 mg | EXTENDED_RELEASE_TABLET | Freq: Two times a day (BID) | ORAL | Status: DC
  Administered 2023-05-09: 30 mg via ORAL
  Filled 2023-05-09: qty 2

## 2023-05-09 NOTE — Discharge Instructions (Addendum)
 Flush drain daily with 5 cc NS, record output QD, dressing changes every 2-3 days or earlier if soiled   Follow with Allwardt, Alyssa M, PA-C in 5-7 days  Please get a complete blood count and chemistry panel checked by your Primary MD at your next visit, and again as instructed by your Primary MD. Please get your medications reviewed and adjusted by your Primary MD.  Please request your Primary MD to go over all Hospital Tests and Procedure/Radiological results at the follow up, please get all Hospital records sent to your Prim MD by signing hospital release before you go home.  In some cases, there will be blood work, cultures and biopsy results pending at the time of your discharge. Please request that your primary care M.D. goes through all the records of your hospital data and follows up on these results.  If you had Pneumonia of Lung problems at the Hospital: Please get a 2 view Chest X ray done in 6-8 weeks after hospital discharge or sooner if instructed by your Primary MD.  If you have Congestive Heart Failure: Please call your Cardiologist or Primary MD anytime you have any of the following symptoms:  1) 3 pound weight gain in 24 hours or 5 pounds in 1 week  2) shortness of breath, with or without a dry hacking cough  3) swelling in the hands, feet or stomach  4) if you have to sleep on extra pillows at night in order to breathe  Follow cardiac low salt diet and 1.5 lit/day fluid restriction.  If you have diabetes Accuchecks 4 times/day, Once in AM empty stomach and then before each meal. Log in all results and show them to your primary doctor at your next visit. If any glucose reading is under 80 or above 300 call your primary MD immediately.  If you have Seizure/Convulsions/Epilepsy: Please do not drive, operate heavy machinery, participate in activities at heights or participate in high speed sports until you have seen by Primary MD or a Neurologist and advised to do so  again. Per [place]  DMV statutes, patients with seizures are not allowed to drive until they have been seizure-free for six months.  Use caution when using heavy equipment or power tools. Avoid working on ladders or at heights. Take showers instead of baths. Ensure the water temperature is not too high on the home water heater. Do not go swimming alone. Do not lock yourself in a room alone (i.e. bathroom). When caring for infants or small children, sit down when holding, feeding, or changing them to minimize risk of injury to the child in the event you have a seizure. Maintain good sleep hygiene. Avoid alcohol.   If you had Gastrointestinal Bleeding: Please ask your Primary MD to check a complete blood count within one week of discharge or at your next visit. Your endoscopic/colonoscopic biopsies that are pending at the time of discharge, will also need to followed by your Primary MD.  Get Medicines reviewed and adjusted. Please take all your medications with you for your next visit with your Primary MD  Please request your Primary MD to go over all hospital tests and procedure/radiological results at the follow up, please ask your Primary MD to get all Hospital records sent to his/her office.  If you experience worsening of your admission symptoms, develop shortness of breath, life threatening emergency, suicidal or homicidal thoughts you must seek medical attention immediately by calling 911 or calling your MD immediately  if symptoms  less severe.  You must read complete instructions/literature along with all the possible adverse reactions/side effects for all the Medicines you take and that have been prescribed to you. Take any new Medicines after you have completely understood and accpet all the possible adverse reactions/side effects.   Do not drive or operate heavy machinery when taking Pain medications.   Do not take more than prescribed Pain, Sleep and Anxiety Medications  Special  Instructions: If you have smoked or chewed Tobacco  in the last 2 yrs please stop smoking, stop any regular Alcohol  and or any Recreational drug use.  Wear Seat belts while driving.  Please note You were cared for by a hospitalist during your hospital stay. If you have any questions about your discharge medications or the care you received while you were in the hospital after you are discharged, you can call the unit and asked to speak with the hospitalist on call if the hospitalist that took care of you is not available. Once you are discharged, your primary care physician will handle any further medical issues. Please note that NO REFILLS for any discharge medications will be authorized once you are discharged, as it is imperative that you return to your primary care physician (or establish a relationship with a primary care physician if you do not have one) for your aftercare needs so that they can reassess your need for medications and monitor your lab values.  You can reach the hospitalist office at phone [phone] or fax [phone]   If you do not have a primary care physician, you can call [phone] for a physician referral.  Activity: As tolerated with Full fall precautions use walker/cane & assistance as needed    Diet: regular  Disposition Home             Interventional Radiology Percutaneous Abscess Drain Placement After Care   This sheet gives you information about how to care for yourself after your procedure. Your health care provider may also give you more specific instructions. Your drain was placed by an interventional radiologist with [facility]. If you have questions or concerns, contact [facility] at [phone].   What is a percutaneous drain?   A drain is a small plastic tube (catheter) that goes into the fluid collection in your body through your skin.   How long will I need the drain?   How long the drain needs to stay in is  determined by where the drain is, how much comes out of the drain each day and if you are having any other surgical procedures.   Interventional radiology will determine when it is time to remove the drain. It is important to follow up as directed so that the drain can be removed as soon as it is safe to do so.   What can I expect after the procedure?   After the procedure, it is common to have:   A small amount of bruising and discomfort in the area where the drainage tube (catheter) was placed.   Sleepiness and fatigue. This should go away after the medicines you were given have worn off.   Follow these instructions at home:   Insertion site care   Check your insertion site when you change the bandage. Check for:   More redness, swelling, or pain.   More fluid or blood.   Warmth.   Pus or a bad smell.   When caring for your insertion site:   Wash your hands  with soap and water for at least 20 seconds before and after you change your bandage (dressing). If soap and water are not available, use hand sanitizer.   You do not need to change your dressing everyday if it is clean and dry. Change your dressing every 3 days or as needed when it is soiled, wet or becoming dislodged. You will need to change your dressing each time you shower.   Leave stitches (sutures), skin glue, or adhesive strips in place. These skin closures may need to stay in place for 2 weeks or longer. If adhesive strip edges start to loosen and curl up, you may trim the loose edges. Do not remove adhesive strips completely unless your health care provider tells you to do so.   Catheter care   Flush the catheter once per day with 5 mL of 0.9% normal saline unless you are told otherwise by your healthcare provider. This helps to prevent clogs in the catheter.   To disconnect the drain, turn the clear plastic tube to the left. Attach the saline syringe by placing it on the white end of the drain and turning gently  to the right. Once attached gently push the plunger to the 5 mL mark. After you are done flushing, disconnect the syringe by turning to the left and reattach your drainage container   If you have a bulb please be sure the bulb is charged after reconnecting it - to do this pinch the bulb between your thumb and first finger and close the stopper located on the top of the bulb.    Check for fluid leaking from around your catheter (instead of fluid draining through your catheter). This may be a sign that the drain is no longer working correctly.   Write down the following information every time you empty your bag:   The date and time.   The amount of drainage.   Activity   Rest at home for 1-2 days after your procedure.   For the first 48 hours do not lift anything more than 10 lbs (about a gallon of milk). You may perform moderate activities/exercise. Please avoid strenuous activities during this time.   Avoid any activities which may pull on your drain as this can cause your drain to become dislodged.   If you were given a sedative during the procedure, it can affect you for several hours. Do not drive or operate machinery until your health care provider says that it is safe.   General instructions   For mild pain take over-the-counter medications as needed for pain such as Tylenol or Advil. If you are experiencing severe pain please call our office as this may indicate an issue with your drain.    If you were prescribed an antibiotic medicine, take it as told by your health care provider. Do not stop using the antibiotic even if you start to feel better.   You may shower 24 hours after the drain is placed. To do this cover the insertion site with a water tight material such as saran wrap and seal the edges with tape, you may also purchase waterproof dressings at your local drug store. Shower as usual and then remove the water tight dressing and any gauze/tape underneath it once you have  exited the shower and dried off. Allow the area to air dry or pat dry with a clean towel. Once the skin is completely dry place a new gauze dressing. It is important to keep the site  dry at all times to prevent infection.   Do not submerge the drain - this means you cannot take baths, swim, use a hot tub, etc. until the drain is removed.    Do not use any products that contain nicotine or tobacco, such as cigarettes, e-cigarettes, and chewing tobacco. If you need help quitting, ask your health care provider.   Keep all follow-up visits as told by your health care provider. This is important.   Contact a health care provider if:   You have less than 10 mL of drainage a day for 2-3 days in a row, or as directed by your health care provider.   You have any of these signs of infection:   More redness, swelling, or pain around your incision area.   More fluid or blood coming from your incision area.   Warmth coming from your incision area.   Pus or a bad smell coming from your incision area.   You have fluid leaking from around your catheter (instead of through your catheter).   You are unable to flush the drain.   You have a fever or chills.   You have pain that does not get better with medicine.   You have not been contacted to schedule a drain follow up appointment within 10 days of discharge from the hospital.   Please call [facility] at [phone] with any questions or concerns.   Get help right away if:   Your catheter comes out.   You suddenly stop having drainage from your catheter.   You suddenly have blood in the fluid that is draining from your catheter.   You become dizzy or you faint.   You develop a rash.   You have nausea or vomiting.   You have difficulty breathing or you feel short of breath.   You develop chest pain.   You have problems with your speech or vision.   You have trouble balancing or moving your arms or legs.   Summary    It is common to have a small amount of bruising and discomfort in the area where the drainage tube (catheter) was placed. You may also have minor discomfort with movement while the drain is in place.   Flush the drain once per day with 5 mL of 0.9% normal saline (unless you were told otherwise by your healthcare provider).    Record the amount of drainage from the bag every time you empty it.   Change the dressing every 3 days or earlier if soiled/wet. Keep the skin dry under the dressing.   You may shower with the drain in place. Do not submerge the drain (no baths, swimming, hot tubs, etc.).   Contact [facility] at [phone] if you have more redness, swelling, or pain around your incision area or if you have pain that does not get better with medicine.   This information is not intended to replace advice given to you by your health care provider. Make sure you discuss any questions you have with your health care provider.   Document Revised: 04/17/2021 Document Reviewed: 01/07/2019   [employer] Patient Education  2023 [employer]         Interventional Radiology Drain Record   Empty your drain at least once per day. You may empty it as often as needed. Use this form to write down the amount of fluid that has collected in the drainage container. Bring this form with you to your follow-up  visits. Please call [facility] at [phone] with any questions or concerns prior to your appointment.   Drain #1 location: ___________________   Date __________ Time __________ Amount __________   Date __________ Time __________ Amount __________   Date __________ Time __________ Amount __________   Date __________ Time __________ Amount __________   Date __________ Time __________ Amount __________   Date __________ Time __________ Amount __________   Date __________ Time __________ Amount __________   Date __________ Time __________ Amount __________    Date __________ Time __________ Amount __________   Date __________ Time __________ Amount __________   Date __________ Time __________ Amount __________   Date __________ Time __________ Amount __________   Date __________ Time __________ Amount __________   Date __________ Time __________ Amount __________

## 2023-05-09 NOTE — Plan of Care (Signed)

## 2023-05-09 NOTE — Discharge Summary (Signed)
 Physician Discharge Summary  Kathryn Underwood ZOX:[id number] DOB: 1961-01-18 DOA: 05/06/2023  PCP: Alda Amas, PA-C  Admit date: 05/06/2023 Discharge date: 05/09/2023  Admitted From: home Disposition:  home  Recommendations for Outpatient Follow-up:  Follow up with PCP in 1-2 weeks Follow up with Dr Maryalice Smaller as scheduled Resume radiation tx in 1 day as scheduled  Home Health: none Equipment/Devices: none  Discharge Condition: stable CODE STATUS: DNR Diet Orders (From admission, onward)     Start     Ordered   05/07/23 1711  Diet regular Fluid consistency: Thin  Diet effective now       Question:  Fluid consistency:  Answer:  Thin   05/07/23 1710            HPI: Per admitting MD, Kathryn Underwood is a 63 y.o. 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  Patient does have an IR placed cholecystostomy tube, however over the last few days she has been having worsening bilirubinemia.  This is essentially asymptomatic.  She has some chronic abdominal distention, which she states is stable.  She denies any fevers, or worsening pain from her baseline.  Due to worsening hyperbilirubinemia and bile duct obstruction from presumed tumor invasion, her oncologist Dr. Grayland Le discussed with IR, who plans PTC.  She was admitted to the hospital service today anticipation of biliary drainage.   Hospital Course / Discharge diagnoses: Principal problem Unresectable pancreatic adenocarcinoma, biliary obstruction-oncology following closely as an outpatient, she received chemotherapy and currently is on radiation.  She is here with worsening jaundice and worsening biliary ductal dilatation on the CT scan.  She already has a biliary drain, will likely need to be revised, IR consulted, she is status post  internal/external biliary drain placement with cholangiogram on 4/11.  Following the procedure drains appear to work well, LFTs are improved with   Active problems Cancer-related pain -patient has been having back pain even at home, but this is slightly in a different location and much worse in the setting of procedure as per #1.  She had to have her pain medication adjusted, was placed on MS Contin with significant improvement and she is now more comfortable.  Constipation-due to narcotics, continue home bowel regimen  Splenic vein thrombosis-resume Eliquis Chronic anemia -in the setting of underlying malignancy Elevated LFTs-due to #1, improving today Hyponatremia-mild, overall stable   Sepsis ruled out   Discharge Instructions   Allergies as of 05/09/2023   No Known Allergies      Medication List     TAKE these medications    Eliquis 5 MG Tabs tablet Generic drug: apixaban TAKE 1 TABLET BY MOUTH TWICE A DAY   Ensure Max Protein Liqd Take 330 mLs (11 oz total) by mouth 2 (two) times daily.   famotidine 20 MG tablet Commonly known as: PEPCID Take 1 tablet (20 mg total) by mouth 2 (two) times daily.   furosemide 20 MG tablet Commonly known as: LASIX TAKE 1 TABLET (20 MG TOTAL) BY MOUTH DAILY. FOR 5 DAYS THEN AS NEEDED FOR LEG EDEMA   gabapentin 300 MG capsule Commonly known as: NEURONTIN Take 2 capsules (600 mg total) by mouth 2 (two) times daily.   lidocaine-prilocaine cream Commonly known as: EMLA Apply to affected area once   mirtazapine 45 MG tablet Commonly known as: REMERON Take 1 tablet (45 mg total)  by mouth at bedtime.   morphine 30 MG 12 hr tablet Commonly known as: MS CONTIN Take 1 tablet (30 mg total) by mouth every 12 (twelve) hours for 7 days.   MULTIVITAMIN ADULT PO Take 1 tablet by mouth daily.   Normal Saline Flush 0.9 % Soln Use 5 - 10 mLs to flush abdominal drain once daily as directed   ondansetron 8 MG tablet Commonly known as:  Zofran Take 1 tablet (8 mg total) by mouth every 8 (eight) hours as needed for nausea or vomiting.   Oxycodone HCl 10 MG Tabs Take 1.5-2 tablets (15-20 mg total) by mouth every 3 (three) hours as needed (pain). What changed:  how much to take when to take this reasons to take this   potassium chloride 10 MEQ tablet Commonly known as: KLOR-CON M TAKE 1 TABLET (10 MEQ TOTAL) BY MOUTH DAILY. WHEN YOU TAKE LASIX   prochlorperazine 10 MG tablet Commonly known as: COMPAZINE Take 1 tablet (10 mg total) by mouth every 6 (six) hours as needed for nausea or vomiting.   QUEtiapine 100 MG tablet Commonly known as: SEROQUEL Take 2 tablets (200 mg total) by mouth at bedtime.   sucralfate 1 GM/10ML suspension Commonly known as: CARAFATE TAKE 10 MLS (1 G TOTAL) BY MOUTH 4 (FOUR) TIMES DAILY - WITH MEALS AND AT BEDTIME.   Vitamin D (Ergocalciferol) 1.25 MG (50000 UNIT) Caps capsule Commonly known as: DRISDOL Take 1 capsule (50,000 Units total) by mouth every 7 (seven) days. What changed: additional instructions       Consultations: Oncology IR  Procedures/Studies:  IR INT EXT BILIARY DRAIN WITH CHOLANGIOGRAM Result Date: 05/07/2023 INDICATION: Pancreatic carcinoma and progressive biliary obstruction. EXAM: INTERNAL/EXTERNAL BILIARY DRAINAGE CATHETER PLACEMENT WITH CHOLANGIOGRAM MEDICATIONS: 2 g IV cefoxitin; The antibiotic was administered within an appropriate time frame prior to the initiation of the procedure. ANESTHESIA/SEDATION: Moderate (conscious) sedation was employed during this procedure. A total of Versed 2.0 mg and Fentanyl 75 mcg was administered intravenously by the radiology nurse. Total intra-service moderate Sedation Time: 31 minutes. The patient's level of consciousness and vital signs were monitored continuously by radiology nursing throughout the procedure under my direct supervision. FLUOROSCOPY: Radiation Exposure Index (as provided by the fluoroscopic device): 2.0 mGy  Kerma CONTRAST:  20 mL Omnipaque 300 COMPLICATIONS: None immediate. PROCEDURE: Informed written consent was obtained from the patient after a thorough discussion of the procedural risks, benefits and alternatives. All questions were addressed. Maximal Sterile Barrier Technique was utilized including caps, mask, sterile gowns, sterile gloves, sterile drape, hand hygiene and skin antiseptic. Local anesthesia was provided with 1% lidocaine. The abdominal wall was prepped with chlorhexidine. A timeout was performed prior to the initiation of the procedure. Ultrasound was used to localize the liver and bile ducts. Under direct ultrasound guidance, an inferior right lobe peripheral intrahepatic bile duct was punctured with a 21 gauge needle. After stab wishing needle access, a cholangiogram was performed with injection of contrast material via the needle. Fluoroscopic image was saved. A guidewire was advanced via the needle into central bile ducts. A transitional dilator was advanced over the wire. The dilator was removed over a guidewire. A 5 French Kumpe catheter was advanced into the common bile duct. Additional contrast injection was performed. The catheter was further advanced over a hydrophilic guidewire through the common bile duct and into the duodenal lumen. The percutaneous tract was dilated to 10 [place]. A 10 French internal/external biliary drainage catheter was then advanced over the wire. The  distal portion was formed in the duodenum. The biliary drain was injected with contrast and fluoroscopic image saved to confirm drain position. The drain was then secured at the skin with a Prolene retention suture and StatLock device. It was attached to gravity bag drainage. FINDINGS: Cholangiogram confirms a high-grade biliary obstruction with massively dilated intrahepatic bile ducts and common bile duct. After right lobe peripheral biliary access, a 5 French catheter met relative obstruction at the level of the mid  common bile duct just below the cystic duct orifice. 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 An internal/external biliary drainage catheter was able to be placed which is formed in the duodenum and with sideholes extending into the right intrahepatic ducts allowing both internal and external biliary drainage. IMPRESSION: High-grade biliary obstruction involving the mid to distal common bile duct. The obstruction was able to be crossed after right lobe peripheral biliary access allowing placement of a 10 French internal/external biliary drainage catheter which is formed in the duodenum and with sideholes extending into right-sided intrahepatic bile ducts. This will be left currently to gravity bag drainage to maximize biliary decompression. Electronically Signed   By: Erica Hau M.D.   On: 05/07/2023 17:22   CT CHEST ABDOMEN PELVIS W CONTRAST Result Date: 05/04/2023 CLINICAL DATA:  Pancreatic cancer. Chemotherapy and radiation therapy ongoing. Duodenal stent. Biliary drain. * Tracking Code: BO * EXAM: CT CHEST, ABDOMEN, AND PELVIS WITH CONTRAST TECHNIQUE: Multidetector CT imaging of the chest, abdomen and pelvis was performed following the standard protocol during bolus administration of intravenous contrast. RADIATION DOSE REDUCTION: This exam was performed according to the departmental dose-optimization program which includes automated exposure control, adjustment of the mA and/or kV according to patient size and/or use of iterative reconstruction technique. CONTRAST:  75 mL Omnipaque COMPARISON:  Outside CT 03/16/2023 FINDINGS: CT CHEST FINDINGS Cardiovascular: Port in the anterior chest wall with tip in distal SVC. No significant vascular findings. Normal heart size. No pericardial effusion. Mediastinum/Nodes: No axillary or supraclavicular adenopathy. No mediastinal or hilar adenopathy. No  pericardial fluid. Esophagus normal. Lungs/Pleura: Irregular nodule in the LEFT upper lobe measures 7 mm (image 31/3) compared to 6 mm on CT 12/17/2022. The nodule appears slightly more thickened. No new pulmonary nodules. No pleural fluid. Musculoskeletal: No aggressive osseous lesion. CT ABDOMEN AND PELVIS FINDINGS Hepatobiliary: There is moderate intra and extra extrahepatic biliary duct dilatation which is similar to CT 03/16/2023. Common hepatic duct measures 15 mm (62/2) compared to 14 mm. Intrahepatic duct on the LEFT measures 11 mm (image 54/2) compared to 11 mm. Common bile duct measures 9 mm similar prior. Biliary drain is position more superficial than comparison exam. The pigtail is coiled along the capsule of the RIGHT hepatic lobe where previously coil was positioned more centrally in the gallbladder fossa. Pancreas: Fluid collection in the body of the pancreas measures 4.63.6 cm decreased from 5.6 x 4.4 cm. Pancreatic mass is difficult to define. Spleen: Normal spleen Adrenals/urinary tract: Adrenal glands and kidneys are normal. The ureters and bladder normal. Stomach/Bowel: Stomach contains oral contrast. Duodenal stent is noted. Contrast flows through the stent into the small bowel. No obstruction. Colon is normal. Vascular/Lymphatic: Abdominal aorta is normal caliber with atherosclerotic calcification. There is no retroperitoneal or periportal lymphadenopathy. No pelvic lymphadenopathy. Reproductive: Fibroid uterus. Other: No intraperitoneal free fluid.  No peritoneal nodularity. Musculoskeletal: No aggressive osseous lesion. IMPRESSION: 1. Moderate intra and  extrahepatic biliary duct dilatation similar to comparison CT 03/16/2023. 2. Percutaneous biliary drain is position more superficial than comparison exam along the capsule the liver. 3. No ascites. 4. Duodenal stent appears patent and unchanged. 5. Fluid collection along the body the pancreas is decreased in volume. Pancreatic mass poorly  defined. 6. No peritoneal metastasis identified. 7. Irregular nodule in the LEFT upper lobe is stable. Recommend attention on follow-up. Electronically Signed   By: Genevive Bi M.D.   On: 05/04/2023 11:58   IR CHOLANGIOGRAM EXISTING TUBE Result Date: 04/29/2023 CLINICAL DATA:  increasing LFT's Receipt, 63 year old female with a history of pancreatic adenocarcinoma with biliary obstruction. Percutaneous cholecystostomy placed 12/13/2022, most recently exchanged 04/16/2023. Recent tug, with concern for catheter dislodgement. EXAM: CHOLANGIOGRAM VIA EXISTING CATHETER COMPARISON:  IR fluoroscopy, 04/16/2023.  CT AP, 03/16/2023. CONTRAST:  10 mL Omnipaque 300-administered via the existing percutaneous drain. FLUOROSCOPY TIME:  Fluoroscopic dose; 1 mGy TECHNIQUE: The patient was positioned supine on the fluoroscopy table. A preprocedural spot fluoroscopic image was obtained of the RIGHT upper quadrant and the existing percutaneous drainage catheter. Multiple spot fluoroscopic and radiographic images were obtained following the injection of a small amount of contrast via the existing percutaneous drainage catheter. FINDINGS: Stable positioning of RIGHT upper quadrant percutaneous cholecystostomy tube. Contrast injection demonstrated a normal functioning with opacification of the decompressed gallbladder. IMPRESSION: Stable positioning of well-functioning percutaneous cholecystostomy tube. RECOMMENDATIONS: The patient will return to Vascular Inter[facility]iology (V[facility]) for routine drainage catheter evaluation and exchange scheduled for 05/28/2023. Roanna Banning, MD [facility] Electronically Signed   By: Roanna Banning M.D.   On: 04/29/2023 17:18   IR EXCHANGE BILIARY DRAIN Result Date: 04/16/2023 INDICATION: 63 year old female presents for routine percutaneous cholecystostomy exchange EXAM: IMAGE GUIDED PERCUTANEOUS CHOLECYSTOSTOMY EXCHANGE MEDICATIONS:  None ANESTHESIA/SEDATION: None FLUOROSCOPY: Radiation Exposure Index (as provided by the fluoroscopic device): 4 mGy Kerma COMPLICATIONS: None PROCEDURE: Informed written consent was obtained from the patient after a thorough discussion of the procedural risks, benefits and alternatives. All questions were addressed. Maximal Sterile Barrier Technique was utilized including caps, mask, sterile gowns, sterile gloves, sterile drape, hand hygiene and skin antiseptic. A timeout was performed prior to the initiation of the procedure. Patient was position under the image intensifier. The drain in the upper abdomen were prepped and draped in the usual sterile fashion. 1% lidocaine was used for local anesthesia. Initial images were performed. Modified Seldinger technique was then used to exchange for a new 10 [place] percutaneous cholecystostomy. Contrast was injected confirming location. Drain sutured in position and attached to gravity drainage. Patient tolerated the procedure well and remained hemodynamically stable throughout. No complications were encountered and no significant blood loss IMPRESSION: Status post image guided exchange of percutaneous cholecystostomy Signed, Yvone Neu. Miachel Roux, RPVI [facility] Electronically Signed   By: Gilmer Mor D.O.   On: 04/16/2023 16:44     Subjective: - no chest pain, shortness of breath, no abdominal pain, nausea or vomiting.   Discharge Exam: BP 103/63 (BP Location: Left Arm)   Pulse 98   Temp 98.6 F (37 C) (Oral)   Resp 18   Ht 5\' 3"  (1.6 m)   Wt 48.5 kg   SpO2 98%   BMI 18.94 kg/m   General: Pt is alert, awake, not in acute distress Cardiovascular: RRR, S1/S2 +, no rubs, no gallops Respiratory: CTA bilaterally, no wheezing, no rhonchi Abdominal: Soft, NT, ND, bowel sounds + Extremities: no  edema, no cyanosis    The results of significant diagnostics from this hospitalization  (including imaging, microbiology, ancillary and laboratory) are listed below for reference.     Microbiology: No results found for this or any previous visit (from the past 240 hours).   Labs: Basic Metabolic Panel: Recent Labs  Lab 05/03/23 1000 05/05/23 0948 05/07/23 0500 05/08/23 0558 05/09/23 0559  NA 135 132* 134* 131* 129*  K 3.8 4.1 4.1 3.9 3.5  CL 101 96* 99 97* 95*  CO2 29 28 29 28 27   GLUCOSE 147* 144* 102* 124* 140*  BUN 10 11 10 17 19   CREATININE 0.39* <0.30* <0.30* 0.43* 0.42*  CALCIUM 9.0 8.7* 9.0 8.8* 8.8*  MG  --   --   --  2.2 1.9   Liver Function Tests: Recent Labs  Lab 05/03/23 1000 05/05/23 0948 05/07/23 0500 05/08/23 0558 05/09/23 0559  AST 177* 186* 193* 79* 29  ALT 165* 163* 149* 118* 73*  ALKPHOS 833* 769* 714* 668* 522*  BILITOT 8.9* 10.3*  10.3* 10.5* 4.7* 4.1*  PROT 6.4* 6.5 6.0* 6.3* 6.3*  ALBUMIN 3.4* 2.9* 2.6* 2.7* 2.7*   CBC: Recent Labs  Lab 05/05/23 0948 05/07/23 0500 05/08/23 0558  WBC 9.4 9.1 13.4*  NEUTROABS 6.9  --   --   HGB 10.3* 9.6* 9.6*  HCT 28.7* 28.6* 28.0*  MCV 88.6 94.7 95.6  PLT 389 347 337   CBG: No results for input(s): "GLUCAP" in the last 168 hours. Hgb A1c No results for input(s): "HGBA1C" in the last 72 hours. Lipid Profile No results for input(s): "CHOL", "HDL", "LDLCALC", "TRIG", "CHOLHDL", "LDLDIRECT" in the last 72 hours. Thyroid function studies No results for input(s): "TSH", "T4TOTAL", "T3FREE", "THYROIDAB" in the last 72 hours.  Invalid input(s): "FREET3" Urinalysis    Component Value Date/Time   COLORURINE YELLOW 01/03/2023 2133   APPEARANCEUR CLEAR 01/03/2023 2133   LABSPEC 1.015 01/03/2023 2133   PHURINE 5.0 01/03/2023 2133   GLUCOSEU NEGATIVE 01/03/2023 2133   HGBUR NEGATIVE 01/03/2023 2133   BILIRUBINUR NEGATIVE 01/03/2023 2133   KETONESUR NEGATIVE 01/03/2023 2133   PROTEINUR NEGATIVE 01/03/2023 2133   UROBILINOGEN 0.2 01/23/2010 1750   NITRITE NEGATIVE 01/03/2023 2133    LEUKOCYTESUR NEGATIVE 01/03/2023 2133    FURTHER DISCHARGE INSTRUCTIONS:   Get Medicines reviewed and adjusted: Please take all your medications with you for your next visit with your Primary MD   Laboratory/radiological data: Please request your Primary MD to go over all hospital tests and procedure/radiological results at the follow up, please ask your Primary MD to get all Hospital records sent to his/her office.   In some cases, they will be blood work, cultures and biopsy results pending at the time of your discharge. Please request that your primary care M.D. goes through all the records of your hospital data and follows up on these results.   Also Note the following: If you experience worsening of your admission symptoms, develop shortness of breath, life threatening emergency, suicidal or homicidal thoughts you must seek medical attention immediately by calling 911 or calling your MD immediately  if symptoms less severe.   You must read complete instructions/literature along with all the possible adverse reactions/side effects for all the Medicines you take and that have been prescribed to you. Take any new Medicines after you have completely understood and accpet all the possible adverse reactions/side effects.    Do not drive when taking Pain medications or sleeping medications (Benzodaizepines)   Do not  take more than prescribed Pain, Sleep and Anxiety Medications. It is not advisable to combine anxiety,sleep and pain medications without talking with your primary care practitioner   Special Instructions: If you have smoked or chewed Tobacco  in the last 2 yrs please stop smoking, stop any regular Alcohol  and or any Recreational drug use.   Wear Seat belts while driving.   Please note: You were cared for by a hospitalist during your hospital stay. Once you are discharged, your primary care physician will handle any further medical issues. Please note that NO REFILLS for any  discharge medications will be authorized once you are discharged, as it is imperative that you return to your primary care physician (or establish a relationship with a primary care physician if you do not have one) for your post hospital discharge needs so that they can reassess your need for medications and monitor your lab values.  Time coordinating discharge: 35 minutes  SIGNED:  Kathlen Para, MD, PhD 05/09/2023, 1:37 PM

## 2023-05-09 NOTE — Plan of Care (Signed)
 Patient ID: Kathryn Underwood, female   DOB: 1960/01/31, 63 y.o.   MRN: [id number]  Problem: Education: Goal: Knowledge of General Education information will improve Description: Including pain rating scale, medication(s)/side effects and non-pharmacologic comfort measures Outcome: Adequate for Discharge   Problem: Health Behavior/Discharge Planning: Goal: Ability to manage health-related needs will improve Outcome: Adequate for Discharge   Problem: Clinical Measurements: Goal: Ability to maintain clinical measurements within normal limits will improve Outcome: Adequate for Discharge Goal: Will remain free from infection Outcome: Adequate for Discharge Goal: Diagnostic test results will improve Outcome: Adequate for Discharge Goal: Respiratory complications will improve Outcome: Adequate for Discharge Goal: Cardiovascular complication will be avoided Outcome: Adequate for Discharge   Problem: Activity: Goal: Risk for activity intolerance will decrease Outcome: Adequate for Discharge   Problem: Nutrition: Goal: Adequate nutrition will be maintained Outcome: Adequate for Discharge   Problem: Coping: Goal: Level of anxiety will decrease Outcome: Adequate for Discharge   Problem: Elimination: Goal: Will not experience complications related to bowel motility Outcome: Adequate for Discharge Goal: Will not experience complications related to urinary retention Outcome: Adequate for Discharge   Problem: Pain Managment: Goal: General experience of comfort will improve and/or be controlled Outcome: Adequate for Discharge   Problem: Safety: Goal: Ability to remain free from injury will improve Outcome: Adequate for Discharge   Problem: Skin Integrity: Goal: Risk for impaired skin integrity will decrease Outcome: Adequate for Discharge    Genella Kendall, RN

## 2023-05-09 NOTE — Progress Notes (Addendum)
 Chief Complaint: Patient was seen today for Pancreatic cancer, biliary obstruction/hyperbilirubinemia; referred for percutaneous transhepatic cholangiogram with biliary drain placement   Referring Physician(s): Feng,Yan  Supervising Physician: Erica Hau  Patient Status: [facility] - In-pt  Subjective: 4/13: Patient reports doing well today. Her pain is controlled today with a new medication regimen. Patient reports constipation, but has been able to have bowel movements. Patient is tolerating foods and liquids. Denies: nausea, vomiting, diarrhea, chest pain, worsening cough/shortness of breath, abdominal pain (other than drain sites), and/or fever.   Objective: Physical Exam: BP 103/63 (BP Location: Left Arm)   Pulse 98   Temp 98.6 F (37 C) (Oral)   Resp 18   Ht 5\' 3"  (1.6 m)   Wt 106 lb 14.8 oz (48.5 kg)   SpO2 98%   BMI 18.94 kg/m  Constitutional: adult female, sitting upright on edge of bed, INAD. Appears jaundice.  Skin: warm and dry. There are 2 RUQ drains present without surrounding erythema or edema at insertion sites. There is clear drainage present from cholecystostomy drain, 2nd biliary drain has evidence of bilious colored output. Mild TTP over insertion sites. Clean/dry dressing overlies drain sites. Chest: symmetric rise and fall of chest.  Pulm: no evidence of respiratory distress.  Neuro: alert and oriented.    Current Facility-Administered Medications:    albuterol (PROVENTIL) (2.5 MG/3ML) 0.083% nebulizer solution 2.5 mg, 2.5 mg, Nebulization, Q2H PRN, Jannette Mend, Mir M, MD   apixaban (ELIQUIS) tablet 5 mg, 5 mg, Oral, BID, Gherghe, Costin M, MD, 5 mg at 05/09/23 1610   Chlorhexidine Gluconate Cloth 2 % PADS 6 each, 6 each, Topical, Daily, Chavez, Abigail, NP, 6 each at 05/09/23 0925   famotidine (PEPCID) tablet 20 mg, 20 mg, Oral, BID, Ikramullah, Mir M, MD, 20 mg at 05/09/23 9604   gabapentin (NEURONTIN) capsule 600 mg, 600 mg, Oral, BID, Gherghe, Costin  M, MD, 600 mg at 05/09/23 0925   HYDROmorphone (DILAUDID) injection 0.5-1 mg, 0.5-1 mg, Intravenous, Q2H PRN, Jannette Mend, Mir M, MD, 1 mg at 05/09/23 1203   ibuprofen (ADVIL) tablet 400 mg, 400 mg, Oral, Q6H PRN, Jannette Mend, Mir M, MD   mirtazapine (REMERON) tablet 45 mg, 45 mg, Oral, QHS, Ikramullah, Mir M, MD, 45 mg at 05/08/23 2103   morphine (MS CONTIN) 12 hr tablet 30 mg, 30 mg, Oral, Q12H, Gherghe, Costin M, MD, 30 mg at 05/09/23 0925   ondansetron (ZOFRAN) tablet 4 mg, 4 mg, Oral, Q6H PRN **OR** ondansetron (ZOFRAN) injection 4 mg, 4 mg, Intravenous, Q6H PRN, Jannette Mend, Mir M, MD, 4 mg at 05/06/23 1951   oxyCODONE (Oxy IR/ROXICODONE) immediate release tablet 15-20 mg, 15-20 mg, Oral, Q4H PRN, Gherghe, Costin M, MD, 15 mg at 05/08/23 2011   polyethylene glycol (MIRALAX / GLYCOLAX) packet 17 g, 17 g, Oral, BID, Gherghe, Costin M, MD, 17 g at 05/08/23 1019   QUEtiapine (SEROQUEL) tablet 200 mg, 200 mg, Oral, QHS, Ikramullah, Mir M, MD, 200 mg at 05/08/23 2104   senna-docusate (Senokot-S) tablet 2 tablet, 2 tablet, Oral, BID, Gherghe, Costin M, MD, 2 tablet at 05/08/23 2103   sodium chloride flush (NS) 0.9 % injection 10-40 mL, 10-40 mL, Intracatheter, Q12H, Bailey Lesser, NP, 10 mL at 05/09/23 0924   sodium chloride flush (NS) 0.9 % injection 10-40 mL, 10-40 mL, Intracatheter, PRN, Chavez, Abigail, NP   sodium chloride flush (NS) 0.9 % injection 5 mL, 5 mL, Intracatheter, Q8H, Erica Hau, MD, 5 mL at 05/09/23 0516  Labs: CBC Recent Labs  05/07/23 0500 05/08/23 0558  WBC 9.1 13.4*  HGB 9.6* 9.6*  HCT 28.6* 28.0*  PLT 347 337   BMET Recent Labs    05/08/23 0558 05/09/23 0559  NA 131* 129*  K 3.9 3.5  CL 97* 95*  CO2 28 27  GLUCOSE 124* 140*  BUN 17 19  CREATININE 0.43* 0.42*  CALCIUM 8.8* 8.8*   LFT Recent Labs    05/09/23 0559  PROT 6.3*  ALBUMIN 2.7*  AST 29  ALT 73*  ALKPHOS 522*  BILITOT 4.1*   PT/INR Recent Labs    05/07/23 0500  LABPROT 17.1*   INR 1.4*     Studies/Results: IR INT EXT BILIARY DRAIN WITH CHOLANGIOGRAM Result Date: 05/07/2023 INDICATION: Pancreatic carcinoma and progressive biliary obstruction. EXAM: INTERNAL/EXTERNAL BILIARY DRAINAGE CATHETER PLACEMENT WITH CHOLANGIOGRAM MEDICATIONS: 2 g IV cefoxitin; The antibiotic was administered within an appropriate time frame prior to the initiation of the procedure. ANESTHESIA/SEDATION: Moderate (conscious) sedation was employed during this procedure. A total of Versed 2.0 mg and Fentanyl 75 mcg was administered intravenously by the radiology nurse. Total intra-service moderate Sedation Time: 31 minutes. The patient's level of consciousness and vital signs were monitored continuously by radiology nursing throughout the procedure under my direct supervision. FLUOROSCOPY: Radiation Exposure Index (as provided by the fluoroscopic device): 2.0 mGy Kerma CONTRAST:  20 mL Omnipaque 300 COMPLICATIONS: None immediate. PROCEDURE: Informed written consent was obtained from the patient after a thorough discussion of the procedural risks, benefits and alternatives. All questions were addressed. Maximal Sterile Barrier Technique was utilized including caps, mask, sterile gowns, sterile gloves, sterile drape, hand hygiene and skin antiseptic. Local anesthesia was provided with 1% lidocaine. The abdominal wall was prepped with chlorhexidine. A timeout was performed prior to the initiation of the procedure. Ultrasound was used to localize the liver and bile ducts. Under direct ultrasound guidance, an inferior right lobe peripheral intrahepatic bile duct was punctured with a 21 gauge needle. After stab wishing needle access, a cholangiogram was performed with injection of contrast material via the needle. Fluoroscopic image was saved. A guidewire was advanced via the needle into central bile ducts. A transitional dilator was advanced over the wire. The dilator was removed over a guidewire. A 5 French Kumpe  catheter was advanced into the common bile duct. Additional contrast injection was performed. The catheter was further advanced over a hydrophilic guidewire through the common bile duct and into the duodenal lumen. The percutaneous tract was dilated to 10 [place]. A 10 French internal/external biliary drainage catheter was then advanced over the wire. The distal portion was formed in the duodenum. The biliary drain was injected with contrast and fluoroscopic image saved to confirm drain position. The drain was then secured at the skin with a Prolene retention suture and StatLock device. It was attached to gravity bag drainage. FINDINGS: Cholangiogram confirms a high-grade biliary obstruction with massively dilated intrahepatic bile ducts and common bile duct. After right lobe peripheral biliary access, a 5 French catheter met relative obstruction at the level of the mid common bile duct just below the cystic duct orifice. 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 An internal/external biliary drainage catheter was able to be placed which is formed in the duodenum and with sideholes extending into the right intrahepatic ducts allowing both internal and external biliary drainage. IMPRESSION: High-grade biliary obstruction involving the mid to distal common bile duct. The obstruction  was able to be crossed after right lobe peripheral biliary access allowing placement of a 10 French internal/external biliary drainage catheter which is formed in the duodenum and with sideholes extending into right-sided intrahepatic bile ducts. This will be left currently to gravity bag drainage to maximize biliary decompression. Electronically Signed   By: Erica Hau M.D.   On: 05/07/2023 17:22    Assessment/Plan: Pancreatic cancer, biliary obstruction/hyperbilirubinemia; referred for percutaneous transhepatic  cholangiogram with biliary drain placement    Drain Location: RUQ x 2 Size: Fr size: 10 Fr x 2  Date of placement: biliary drain placed 05/07/23 and cholecystostomy placed 11/2022 and exchanged 04/16/23. Currently to: Drain collection device: gravity 24 hour output:  Output by Drain (mL) 05/07/23 0700 - 05/07/23 1459 05/07/23 1500 - 05/07/23 2259 05/07/23 2300 - 05/08/23 0659 05/08/23 0700 - 05/08/23 1459 05/08/23 1500 - 05/08/23 2259 05/08/23 2300 - 05/09/23 0659 05/09/23 0700 - 05/09/23 1316  Biliary Tube Cook slip-coat 10.2 Fr. RUQ  230 675 470 250 300 5    Interval imaging/drain manipulation:  TBD  Current examination: Flushes/aspirates easily x 2 drains.  Roughly 5-10 mL output from cholecystostomy drain. 1020 mL from biliary drain in the last 24 hours. Insertion sites unremarkable. Suture and stat lock in place. Dressed appropriately.  Adequate pain control today.   Plan: Continue TID flushes with 5 cc NS. Record output Q shift. Dressing changes QD or PRN if soiled.  Continue both drains. Consider capping trial for cholecystostomy drain if disposition is in the near future.  Continue pain control.  Call IR APP or on call IR MD if difficulty flushing or sudden change in drain output.  Repeat imaging/possible drain injection once output < 10 mL/QD (excluding flush material). Consideration for drain removal if output is < 10 mL/QD (excluding flush material), pending discussion with the providing surgical service.  Discharge planning:  Patient will be discharged today 4/13. Patient educated about how to take care of drain. This includes flushing once daily, recording output once daily and color. Patient instructed to change bandage every 2-3 days or sooner if needed. Patient has a pre existing follow up 5/2 and both drains will be addressed at that time. She was given return precautions and reasons to contact our office. Patient has flushes at home and refills if more are needed.     IR will continue to follow - please call with questions or concerns.       LOS: 3 days   I spent a total of 20 minutes in face to face in clinical consultation, greater than 50% of which was counseling/coordinating care for Pancreatic cancer, biliary obstruction/hyperbilirubinemia; referred for percutaneous transhepatic cholangiogram with biliary drain placement   Noorah Giammona N Alvina Strother PA-C 05/09/2023 1:11 PM

## 2023-05-10 ENCOUNTER — Ambulatory Visit
Admission: RE | Admit: 2023-05-10 | Discharge: 2023-05-10 | Disposition: A | Discharge status: Home / Self Care | Source: Ambulatory Visit | Attending: Radiation Oncology | Admitting: Radiation Oncology

## 2023-05-10 ENCOUNTER — Telehealth: Payer: Self-pay

## 2023-05-10 ENCOUNTER — Other Ambulatory Visit (HOSPITAL_COMMUNITY): Payer: Self-pay

## 2023-05-10 ENCOUNTER — Other Ambulatory Visit: Payer: Self-pay

## 2023-05-10 ENCOUNTER — Encounter: Payer: Self-pay | Admitting: Hematology

## 2023-05-10 DIAGNOSIS — C258 Malignant neoplasm of overlapping sites of pancreas: Secondary | ICD-10-CM | POA: Diagnosis present

## 2023-05-10 DIAGNOSIS — Z515 Encounter for palliative care: Secondary | ICD-10-CM | POA: Diagnosis not present

## 2023-05-10 DIAGNOSIS — E871 Hypo-osmolality and hyponatremia: Secondary | ICD-10-CM | POA: Diagnosis not present

## 2023-05-10 DIAGNOSIS — D72829 Elevated white blood cell count, unspecified: Secondary | ICD-10-CM | POA: Diagnosis not present

## 2023-05-10 DIAGNOSIS — F17210 Nicotine dependence, cigarettes, uncomplicated: Secondary | ICD-10-CM | POA: Diagnosis not present

## 2023-05-10 DIAGNOSIS — Z510 Encounter for antineoplastic radiation therapy: Secondary | ICD-10-CM | POA: Diagnosis not present

## 2023-05-10 LAB — RAD ONC ARIA SESSION SUMMARY
Course Elapsed Days: 14
Plan Fractions Treated to Date: 10
Plan Prescribed Dose Per Fraction: 1.8 Gy
Plan Total Fractions Prescribed: 25
Plan Total Prescribed Dose: 45 Gy
Reference Point Dosage Given to Date: 18 Gy
Reference Point Session Dosage Given: 1.8 Gy
Session Number: 10

## 2023-05-10 NOTE — Transitions of Care (Post Inpatient/ED Visit) (Signed)
   05/10/2023  Name: Kathryn Underwood MRN: [id number] DOB: 1960-06-03  Today's TOC FU Call Status: Today's TOC FU Call Status:: Unsuccessful Call (1st Attempt) Unsuccessful Call (1st Attempt) Date: 05/10/23  Attempted to reach the patient regarding the most recent Inpatient/ED visit.  Follow Up Plan: Additional outreach attempts will be made to reach the patient to complete the Transitions of Care (Post Inpatient/ED visit) call.   Signature Darrall Ellison, LPN [facility] Nurse Health Advisor Direct Dial [phone]

## 2023-05-11 ENCOUNTER — Ambulatory Visit
Admission: RE | Admit: 2023-05-11 | Discharge: 2023-05-11 | Disposition: A | Discharge status: Home / Self Care | Source: Ambulatory Visit | Attending: Radiation Oncology | Admitting: Radiation Oncology

## 2023-05-11 ENCOUNTER — Other Ambulatory Visit (HOSPITAL_COMMUNITY): Payer: Self-pay

## 2023-05-11 ENCOUNTER — Other Ambulatory Visit: Payer: Self-pay

## 2023-05-11 DIAGNOSIS — Z510 Encounter for antineoplastic radiation therapy: Secondary | ICD-10-CM | POA: Diagnosis not present

## 2023-05-11 LAB — RAD ONC ARIA SESSION SUMMARY
Course Elapsed Days: 15
Plan Fractions Treated to Date: 11
Plan Prescribed Dose Per Fraction: 1.8 Gy
Plan Total Fractions Prescribed: 25
Plan Total Prescribed Dose: 45 Gy
Reference Point Dosage Given to Date: 19.8 Gy
Reference Point Session Dosage Given: 1.8 Gy
Session Number: 11

## 2023-05-11 NOTE — Assessment & Plan Note (Signed)
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 -She was readmitted to hospital for recurrent nausea and vomiting, likely partial gastric outlet obstruction from pancreatic tumor.  She is able to tolerate full liquid diet, she underwet duodenal stent placement on 01/06/2023 -she started chemo gemcitabine and abraxane on 01/26/2023  -her scan was reviewed by Dr. Leighton Punches and her cancer was deemed to be unresectable, this is confirmed by Dr. Zani at [facility]  -She started concurrent chemoradiation with Xeloda on April 26, 2023 -she developed jaundice on 04/28/2023, underwent PTC on 05/07/2023

## 2023-05-11 NOTE — Transitions of Care (Post Inpatient/ED Visit) (Signed)
   05/11/2023  Name: Kathryn Underwood MRN: [id number] DOB: 01-28-1960  Today's TOC FU Call Status: Today's TOC FU Call Status:: Unsuccessful Call (2nd Attempt) Unsuccessful Call (1st Attempt) Date: 05/10/23 Unsuccessful Call (2nd Attempt) Date: 05/11/23  Attempted to reach the patient regarding the most recent Inpatient/ED visit.  Follow Up Plan: Additional outreach attempts will be made to reach the patient to complete the Transitions of Care (Post Inpatient/ED visit) call.   Signature Darrall Ellison, LPN [facility] Nurse Health Advisor Direct Dial [phone]

## 2023-05-12 ENCOUNTER — Inpatient Hospital Stay

## 2023-05-12 ENCOUNTER — Other Ambulatory Visit: Payer: Self-pay

## 2023-05-12 ENCOUNTER — Ambulatory Visit
Admission: RE | Admit: 2023-05-12 | Discharge: 2023-05-12 | Disposition: A | Discharge status: Home / Self Care | Source: Ambulatory Visit | Attending: Radiation Oncology | Admitting: Radiation Oncology

## 2023-05-12 ENCOUNTER — Encounter: Payer: Self-pay | Admitting: Hematology

## 2023-05-12 ENCOUNTER — Ambulatory Visit

## 2023-05-12 ENCOUNTER — Inpatient Hospital Stay (HOSPITAL_BASED_OUTPATIENT_CLINIC_OR_DEPARTMENT_OTHER): Admitting: Hematology

## 2023-05-12 VITALS — BP 96/52 | HR 106 | Temp 98.0°F | Resp 20 | Ht 63.0 in | Wt 102.4 lb

## 2023-05-12 DIAGNOSIS — G893 Neoplasm related pain (acute) (chronic): Secondary | ICD-10-CM

## 2023-05-12 DIAGNOSIS — E860 Dehydration: Secondary | ICD-10-CM

## 2023-05-12 DIAGNOSIS — Z510 Encounter for antineoplastic radiation therapy: Secondary | ICD-10-CM | POA: Diagnosis not present

## 2023-05-12 DIAGNOSIS — C259 Malignant neoplasm of pancreas, unspecified: Secondary | ICD-10-CM

## 2023-05-12 LAB — CBC WITH DIFFERENTIAL (CANCER CENTER ONLY)
Abs Immature Granulocytes: 0.09 10*3/uL — ABNORMAL HIGH (ref 0.00–0.07)
Basophils Absolute: 0.1 10*3/uL (ref 0.0–0.1)
Basophils Relative: 1 %
Eosinophils Absolute: 0.1 10*3/uL (ref 0.0–0.5)
Eosinophils Relative: 1 %
HCT: 29.8 % — ABNORMAL LOW (ref 36.0–46.0)
Hemoglobin: 9.9 g/dL — ABNORMAL LOW (ref 12.0–15.0)
Immature Granulocytes: 1 %
Lymphocytes Relative: 4 %
Lymphs Abs: 0.5 10*3/uL — ABNORMAL LOW (ref 0.7–4.0)
MCH: 32.7 pg (ref 26.0–34.0)
MCHC: 33.2 g/dL (ref 30.0–36.0)
MCV: 98.3 fL (ref 80.0–100.0)
Monocytes Absolute: 1.7 10*3/uL — ABNORMAL HIGH (ref 0.1–1.0)
Monocytes Relative: 13 %
Neutro Abs: 11.1 10*3/uL — ABNORMAL HIGH (ref 1.7–7.7)
Neutrophils Relative %: 80 %
Platelet Count: 371 10*3/uL (ref 150–400)
RBC: 3.03 MIL/uL — ABNORMAL LOW (ref 3.87–5.11)
RDW: 18.5 % — ABNORMAL HIGH (ref 11.5–15.5)
WBC Count: 13.7 10*3/uL — ABNORMAL HIGH (ref 4.0–10.5)
nRBC: 0 % (ref 0.0–0.2)

## 2023-05-12 LAB — CMP (CANCER CENTER ONLY)
ALT: 29 U/L (ref 0–44)
AST: 14 U/L — ABNORMAL LOW (ref 15–41)
Albumin: 3.6 g/dL (ref 3.5–5.0)
Alkaline Phosphatase: 361 U/L — ABNORMAL HIGH (ref 38–126)
Anion gap: 6 (ref 5–15)
BUN: 23 mg/dL (ref 8–23)
CO2: 30 mmol/L (ref 22–32)
Calcium: 9.4 mg/dL (ref 8.9–10.3)
Chloride: 95 mmol/L — ABNORMAL LOW (ref 98–111)
Creatinine: 0.34 mg/dL — ABNORMAL LOW (ref 0.44–1.00)
GFR, Estimated: >60 mL/min (ref >60)
Glucose, Bld: 155 mg/dL — ABNORMAL HIGH (ref 70–99)
Potassium: 3.7 mmol/L (ref 3.5–5.1)
Sodium: 131 mmol/L — ABNORMAL LOW (ref 135–145)
Total Bilirubin: 3 mg/dL — ABNORMAL HIGH (ref 0.0–1.2)
Total Protein: 6.8 g/dL (ref 6.5–8.1)

## 2023-05-12 LAB — RAD ONC ARIA SESSION SUMMARY
Course Elapsed Days: 16
Plan Fractions Treated to Date: 12
Plan Prescribed Dose Per Fraction: 1.8 Gy
Plan Total Fractions Prescribed: 25
Plan Total Prescribed Dose: 45 Gy
Reference Point Dosage Given to Date: 21.6 Gy
Reference Point Session Dosage Given: 1.8 Gy
Session Number: 12

## 2023-05-12 MED ORDER — OXYCODONE HCL 10 MG PO TABS: 15.0000 mg | ORAL_TABLET | ORAL | 0 refills | Status: DC | PRN

## 2023-05-12 MED ORDER — HEPARIN SOD (PORK) LOCK FLUSH 100 UNIT/ML IV SOLN
500.0000 [IU] | Freq: Once | INTRAVENOUS | Status: AC
Start: 2023-05-12 — End: 2023-05-12
  Administered 2023-05-12: 500 [IU]

## 2023-05-12 MED ORDER — SODIUM CHLORIDE 0.9% FLUSH
10.0000 mL | Freq: Once | INTRAVENOUS | Status: AC
  Administered 2023-05-12: 10 mL

## 2023-05-12 NOTE — Progress Notes (Signed)
 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 -She was readmitted to hospital for recurrent nausea and vomiting, likely partial gastric outlet obstruction from pancreatic tumor.  She is able to tolerate full liquid diet, she underwet duodenal stent placement on 01/06/2023 -she started chemo gemcitabine and abraxane on 01/26/2023  -her scan was reviewed by Dr. Leighton Punches and her cancer was deemed to be unresectable, this is confirmed by Dr. Zani at [facility]  -She started concurrent chemoradiation with Xeloda on April 26, 2023 -she developed jaundice on 04/28/2023, underwent PTC on 05/07/2023   Assessment & Plan Pancreatic cancer Undergoing treatment for pancreatic cancer, recently completed radiation therapy. Bilirubin level decreased from 13 to 4.1 and further to 3, indicating improvement in jaundice. Experiencing pain at the site of a recent procedure, worsens with coughing. Appetite affected, with a weight loss of two pounds since yesterday. Managing nutritional intake with the help of friends and considering liquid nutrition supplements like Ensure or Boost. Xeloda held during radiation course to avoid additional side  effects. - Monitor bilirubin levels - Hold Xeloda during radiation course - Encourage nutritional intake with food and liquid nutrition supplements - Monitor weight and nutritional status - Manage pain with morphine and oxycodone as needed  Pain management Experiencing significant pain, particularly at the site of a recent procedure. Currently taking morphine every 12 hours and oxycodone as needed, with a reported intake of up to 10 oxycodone tablets per day. Suggested increasing morphine dosage to three times a day to better manage pain and reduce reliance on oxycodone. Pain management plan includes adjusting morphine to every 8 hours to provide more consistent pain control. - Increase morphine to three times a day - Refill oxycodone with 90 tablets - Monitor pain levels and adjust medication as needed  Biliary drainage Biliary drain in place, functioning as expected. Output consistent, and urine color improved, indicating effective drainage. Advised to continue flushing the drain and consult with interventional radiology (IR) regarding potential removal of the tube in the future. - Continue flushing the biliary drain - Consult with IR about potential tube removal  Plan - Lab reviewed, her liver function is improving - Continue radiation - Continue holding capecitabine - Lab and follow-up in a week - I refilled her oxycodone, and recommended her increase MS Contin from every 12 hours to every 8 hours.  I communicated with palliative care NP Landa Pine, she will see her next week.   SUMMARY OF ONCOLOGIC HISTORY: Oncology History  Pancreatic adenocarcinoma (HCC)  12/12/2022 Cancer Staging   Staging form: Exocrine Pancreas, AJCC 8th Edition - Clinical stage from 12/12/2022: Stage IIA (cT3, cN0, cM0) - Signed by Sonja Leander, MD on 12/28/2022 Total positive nodes: 0   12/16/2022 Initial Diagnosis   Pancreatic adenocarcinoma (HCC)   01/26/2023 -  Chemotherapy   Patient  is on Treatment Plan :  PANCREATIC Abraxane D1,8 + Gemcitabine D1,8 q21d      Genetic Testing   Negative 76 gene [facility] CancerNext-Expanded +RNAinsight panel. The CancerNext-Expanded gene panel offered by [facility] and includes sequencing, rearrangement, and RNA analysis for the following 76 genes: AIP, ALK, APC, ATM, AXIN2, BAP1, BARD1, BMPR1A, BRCA1, BRCA2, BRIP1, CDC73, CDH1, CDK4, CDKN1B, CDKN2A, CEBPA, CHEK2, CTNNA1, DDX41, DICER1, ETV6, FH, FLCN, GATA2, LZTR1, MAX, MBD4, MEN1, MET, MLH1, MSH2, MSH3, MSH6, MUTYH, NF1, NF2, NTHL1, PALB2, PHOX2B, PMS2, POT1, PRKAR1A, PTCH1, PTEN, RAD51C, RAD51D, RB1, RET, RUNX1, SDHA, SDHAF2, SDHB, SDHC, SDHD, SMAD4, SMARCA4, SMARCB1, SMARCE1, STK11, SUFU, TMEM127, TP53, TSC1, TSC2, VHL, and WT1 (sequencing and deletion/duplication); EGFR, HOXB13, KIT, MITF, PDGFRA, POLD1, and POLE (sequencing only); EPCAM and GREM1 (deletion/duplication only). Report date 03/19/23.    03/16/2023 Imaging   CT CAP with pancreatic protocol and CTA imaging - done [facility] Impression:  1. Local advanced infiltrative mass centered within the pancreatic head with extensive vascular involvement, as above.   2. A 7 mm left upper lobe pulmonary nodule is indeterminate.  3. Moderate biliary dilation with obstruction of the level of the extra hepatic bile ducts and possibly owing to obstruction from the pancreatic head mass and/or inflammatory stricture.   4. Multiple pancreatic collections, largest anterior to the pancreatic body, possibly related to prior pancreatitis.  5. Ovoid nodule within the superior right breast is indeterminant. Consider further evaluation with dedicated mammogram as clinically appropriate for this patient.        Discussed the use of AI scribe software for clinical note transcription with the patient, who gave verbal consent to proceed.  History of Present Illness Kathryn Underwood, a 63 year old female with a known diagnosis of pancreatic cancer, presents for a follow-up  visit. She reports that her gallbladder is barely producing any output, which she was informed is normal due to the placement of a liver drain. She notes that the output from the liver drain is consistent, with approximately 5-7 ounces every 4-5 hours. She also reports experiencing pain at the site of the procedure, which is exacerbated by coughing.  Wanda's appetite has been affected due to recent hospitalization and constipation. She expresses concern about eating too much and becoming constipated again. Despite this, she is making an effort to consume grains, bananas, and other foods. She has lost weight, with a reported loss of two pounds since the previous day.  In terms of pain management, Kathryn Underwood has been taking morphine and oxycodone. She takes two oxycodone pills approximately every three to four hours, sometimes reducing to one pill depending on her pain level. She also takes morphine, which she is considering increasing to three times a day.     All other systems were reviewed with the patient and are negative.  MEDICAL HISTORY:  Past Medical History:  Diagnosis Date   Alcohol abuse    Anxiety    Chickenpox    Depression    Neuromuscular disorder (HCC)    neuropathy   Pancreatic cancer (HCC)    Substance abuse (HCC)     SURGICAL HISTORY: Past Surgical History:  Procedure Laterality Date   BIOPSY  12/10/2022   Procedure: BIOPSY;  Surgeon: Mansouraty, Netty Starring., MD;  Location: [facility];  Service: Gastroenterology;;   CESAREAN SECTION     DUODENAL STENT PLACEMENT N/A 01/06/2023   Procedure: DUODENAL STENT PLACEMENT;  Surgeon: Lemar Lofty., MD;  Location: [facility];  Service: Gastroenterology;  Laterality: N/A;   ESOPHAGOGASTRODUODENOSCOPY  N/A 08/14/2021   Procedure: ESOPHAGOGASTRODUODENOSCOPY (EGD);  Surgeon: Rachael Fee, MD;  Location: [facility];  Service: Gastroenterology;  Laterality: N/A;   ESOPHAGOGASTRODUODENOSCOPY N/A 12/12/2022   Procedure:  ESOPHAGOGASTRODUODENOSCOPY (EGD);  Surgeon: Lemar Lofty., MD;  Location: [facility];  Service: Gastroenterology;  Laterality: N/A;   ESOPHAGOGASTRODUODENOSCOPY (EGD) WITH PROPOFOL N/A 12/10/2022   Procedure: ESOPHAGOGASTRODUODENOSCOPY (EGD) WITH PROPOFOL;  Surgeon: Meridee Score Netty Starring., MD;  Location: [facility];  Service: Gastroenterology;  Laterality: N/A;   ESOPHAGOGASTRODUODENOSCOPY (EGD) WITH PROPOFOL N/A 01/06/2023   Procedure: ESOPHAGOGASTRODUODENOSCOPY (EGD) WITH PROPOFOL;  Surgeon: Meridee Score Netty Starring., MD;  Location: [facility];  Service: Gastroenterology;  Laterality: N/A;  with duodenal stent placement   EUS N/A 08/14/2021   Procedure: UPPER ENDOSCOPIC ULTRASOUND (EUS) RADIAL;  Surgeon: Rachael Fee, MD;  Location: [facility];  Service: Gastroenterology;  Laterality: N/A;   EUS N/A 12/12/2022   Procedure: UPPER ENDOSCOPIC ULTRASOUND (EUS) LINEAR;  Surgeon: Lemar Lofty., MD;  Location: [facility];  Service: Gastroenterology;  Laterality: N/A;   EUS N/A 01/06/2023   Procedure: FULL UPPER ENDOSCOPIC ULTRASOUND (EUS) RADIAL;  Surgeon: Meridee Score Netty Starring., MD;  Location: [facility];  Service: Gastroenterology;  Laterality: N/A;   FINE NEEDLE ASPIRATION N/A 08/14/2021   Procedure: FINE NEEDLE ASPIRATION (FNA) LINEAR;  Surgeon: Rachael Fee, MD;  Location: [facility];  Service: Gastroenterology;  Laterality: N/A;   FINE NEEDLE ASPIRATION N/A 12/12/2022   Procedure: FINE NEEDLE ASPIRATION (FNA) LINEAR;  Surgeon: Lemar Lofty., MD;  Location: [facility];  Service: Gastroenterology;  Laterality: N/A;   FINE NEEDLE ASPIRATION  01/06/2023   Procedure: FINE NEEDLE ASPIRATION;  Surgeon: Lemar Lofty., MD;  Location: [facility];  Service: Gastroenterology;;   IR CHOLANGIOGRAM EXISTING TUBE  04/29/2023   IR EXCHANGE BILIARY DRAIN  01/29/2023   IR EXCHANGE BILIARY DRAIN  03/05/2023   IR EXCHANGE BILIARY DRAIN  04/16/2023   IR  IMAGING GUIDED PORT INSERTION  12/18/2022   IR INT EXT BILIARY DRAIN WITH CHOLANGIOGRAM  05/07/2023   IR PERC CHOLECYSTOSTOMY  12/13/2022   TONSILLECTOMY      I have reviewed the social history and family history with the patient and they are unchanged from previous note.  ALLERGIES:  has no known allergies.  MEDICATIONS:  Current Outpatient Medications  Medication Sig Dispense Refill   ELIQUIS 5 MG TABS tablet TAKE 1 TABLET BY MOUTH TWICE A DAY 60 tablet 1   Ensure Max Protein (ENSURE MAX PROTEIN) LIQD Take 330 mLs (11 oz total) by mouth 2 (two) times daily.     famotidine (PEPCID) 20 MG tablet Take 1 tablet (20 mg total) by mouth 2 (two) times daily. 60 tablet 3   furosemide (LASIX) 20 MG tablet TAKE 1 TABLET (20 MG TOTAL) BY MOUTH DAILY. FOR 5 DAYS THEN AS NEEDED FOR LEG EDEMA (Patient not taking: Reported on 05/06/2023) 20 tablet 0   gabapentin (NEURONTIN) 300 MG capsule Take 2 capsules (600 mg total) by mouth 2 (two) times daily. 120 capsule 3   lidocaine-prilocaine (EMLA) cream Apply to affected area once 30 g 3   mirtazapine (REMERON) 45 MG tablet Take 1 tablet (45 mg total) by mouth at bedtime. 30 tablet 3   morphine (MS CONTIN) 30 MG 12 hr tablet Take 1 tablet (30 mg total) by mouth every 12 (twelve) hours for 7 days. 14 tablet 0   Multiple Vitamin (MULTIVITAMIN ADULT PO) Take 1 tablet by mouth daily.     ondansetron (ZOFRAN) 8 MG tablet  Take 1 tablet (8 mg total) by mouth every 8 (eight) hours as needed for nausea or vomiting. 30 tablet 1   Oxycodone HCl 10 MG TABS Take 1.5-2 tablets (15-20 mg total) by mouth every 3 (three) hours as needed (pain). 90 tablet 0   potassium chloride (KLOR-CON M) 10 MEQ tablet TAKE 1 TABLET (10 MEQ TOTAL) BY MOUTH DAILY. WHEN YOU TAKE LASIX (Patient not taking: Reported on 05/06/2023) 20 tablet 0   prochlorperazine (COMPAZINE) 10 MG tablet Take 1 tablet (10 mg total) by mouth every 6 (six) hours as needed for nausea or vomiting. 30 tablet 1   QUEtiapine  (SEROQUEL) 100 MG tablet Take 2 tablets (200 mg total) by mouth at bedtime. 30 tablet 3   sodium chloride flush (NS) 0.9 % SOLN Use 5 - 10 mLs to flush abdominal drain once daily as directed 300 mL 3   sucralfate (CARAFATE) 1 GM/10ML suspension TAKE 10 MLS (1 G TOTAL) BY MOUTH 4 (FOUR) TIMES DAILY - WITH MEALS AND AT BEDTIME. (Patient not taking: Reported on 05/06/2023) 420 mL 1   Vitamin D, Ergocalciferol, (DRISDOL) 1.25 MG (50000 UNIT) CAPS capsule Take 1 capsule (50,000 Units total) by mouth every 7 (seven) days. (Patient taking differently: Take 50,000 Units by mouth every 7 (seven) days. Sundays) 5 capsule 6   No current facility-administered medications for this visit.    PHYSICAL EXAMINATION: ECOG PERFORMANCE STATUS: 2 - Symptomatic, <50% confined to bed  Vitals:   05/12/23 1035  BP: (!) 96/52  Pulse: (!) 106  Resp: 20  Temp: 98 F (36.7 C)  SpO2: 93%   Wt Readings from Last 3 Encounters:  05/12/23 102 lb 6.4 oz (46.4 kg)  05/08/23 106 lb 14.8 oz (48.5 kg)  05/04/23 109 lb 8 oz (49.7 kg)     GENERAL:alert, no distress and comfortable SKIN: skin color, texture, turgor are normal, no rashes or significant lesions EYES: normal, Conjunctiva are pink and non-injected, sclera clear NECK: supple, thyroid normal size, non-tender, without nodularity LYMPH:  no palpable lymphadenopathy in the cervical, axillary  LUNGS: clear to auscultation and percussion with normal breathing effort HEART: regular rate & rhythm and no murmurs and no lower extremity edema ABDOMEN:abdomen soft, non-tender and normal bowel sounds Musculoskeletal:no cyanosis of digits and no clubbing  NEURO: alert & oriented x 3 with fluent speech, no focal motor/sensory deficits  Physical Exam    LABORATORY DATA:  I have reviewed the data as listed    Latest Ref Rng & Units 05/12/2023   10:10 AM 05/08/2023    5:58 AM 05/07/2023    5:00 AM  CBC  WBC 4.0 - 10.5 K/uL 13.7  13.4  9.1   Hemoglobin 12.0 - 15.0 g/dL  9.9  9.6  9.6   Hematocrit 36.0 - 46.0 % 29.8  28.0  28.6   Platelets 150 - 400 K/uL 371  337  347         Latest Ref Rng & Units 05/12/2023   10:10 AM 05/09/2023    5:59 AM 05/08/2023    5:58 AM  CMP  Glucose 70 - 99 mg/dL 161  096  045   BUN 8 - 23 mg/dL 23  19  17    Creatinine 0.44 - 1.00 mg/dL 4.09  8.11  9.14   Sodium 135 - 145 mmol/L 131  129  131   Potassium 3.5 - 5.1 mmol/L 3.7  3.5  3.9   Chloride 98 - 111 mmol/L 95  95  97  CO2 22 - 32 mmol/L 30  27  28    Calcium 8.9 - 10.3 mg/dL 9.4  8.8  8.8   Total Protein 6.5 - 8.1 g/dL 6.8  6.3  6.3   Total Bilirubin 0.0 - 1.2 mg/dL 3.0  4.1  4.7   Alkaline Phos 38 - 126 U/L 361  522  668   AST 15 - 41 U/L 14  29  79   ALT 0 - 44 U/L 29  73  118       RADIOGRAPHIC STUDIES: I have personally reviewed the radiological images as listed and agreed with the findings in the report. No results found.    No orders of the defined types were placed in this encounter.  All questions were answered. The patient knows to call the clinic with any problems, questions or concerns. No barriers to learning was detected. The total time spent in the appointment was 25 minutes.     Sonja Elliott, MD 05/12/2023

## 2023-05-13 ENCOUNTER — Other Ambulatory Visit: Payer: Self-pay

## 2023-05-13 ENCOUNTER — Ambulatory Visit
Admission: RE | Admit: 2023-05-13 | Discharge: 2023-05-13 | Disposition: A | Discharge status: Home / Self Care | Source: Ambulatory Visit | Attending: Radiation Oncology | Admitting: Radiation Oncology

## 2023-05-13 DIAGNOSIS — Z510 Encounter for antineoplastic radiation therapy: Secondary | ICD-10-CM | POA: Diagnosis not present

## 2023-05-13 LAB — RAD ONC ARIA SESSION SUMMARY
Course Elapsed Days: 17
Plan Fractions Treated to Date: 13
Plan Prescribed Dose Per Fraction: 1.8 Gy
Plan Total Fractions Prescribed: 25
Plan Total Prescribed Dose: 45 Gy
Reference Point Dosage Given to Date: 23.4 Gy
Reference Point Session Dosage Given: 1.8 Gy
Session Number: 13

## 2023-05-13 LAB — CANCER ANTIGEN 19-9: CA 19-9: 84 U/mL — ABNORMAL HIGH (ref 0–35)

## 2023-05-14 ENCOUNTER — Other Ambulatory Visit: Payer: Self-pay

## 2023-05-14 ENCOUNTER — Ambulatory Visit
Admission: RE | Admit: 2023-05-14 | Discharge: 2023-05-14 | Disposition: A | Discharge status: Home / Self Care | Source: Ambulatory Visit | Attending: Radiation Oncology | Admitting: Radiation Oncology

## 2023-05-14 DIAGNOSIS — Z510 Encounter for antineoplastic radiation therapy: Secondary | ICD-10-CM | POA: Diagnosis not present

## 2023-05-14 LAB — RAD ONC ARIA SESSION SUMMARY
Course Elapsed Days: 18
Plan Fractions Treated to Date: 14
Plan Prescribed Dose Per Fraction: 1.8 Gy
Plan Total Fractions Prescribed: 25
Plan Total Prescribed Dose: 45 Gy
Reference Point Dosage Given to Date: 25.2 Gy
Reference Point Session Dosage Given: 1.8 Gy
Session Number: 14

## 2023-05-14 NOTE — Transitions of Care (Post Inpatient/ED Visit) (Signed)
   05/14/2023  Name: Kathryn Underwood MRN: [id number] DOB: Jan 28, 1960  Today's TOC FU Call Status: Today's TOC FU Call Status:: Unsuccessful Call (3rd Attempt) Unsuccessful Call (1st Attempt) Date: 05/10/23 Unsuccessful Call (2nd Attempt) Date: 05/11/23 Unsuccessful Call (3rd Attempt) Date: 05/14/23  Attempted to reach the patient regarding the most recent Inpatient/ED visit.  Follow Up Plan: No further outreach attempts will be made at this time. We have been unable to contact the patient.  Signature Darrall Ellison, LPN [facility] Nurse Health Advisor Direct Dial [phone]

## 2023-05-17 ENCOUNTER — Ambulatory Visit

## 2023-05-17 ENCOUNTER — Telehealth: Payer: Self-pay

## 2023-05-17 ENCOUNTER — Other Ambulatory Visit: Payer: Self-pay

## 2023-05-17 DIAGNOSIS — Z510 Encounter for antineoplastic radiation therapy: Secondary | ICD-10-CM | POA: Diagnosis not present

## 2023-05-17 LAB — RAD ONC ARIA SESSION SUMMARY
Course Elapsed Days: 21
Plan Fractions Treated to Date: 15
Plan Prescribed Dose Per Fraction: 1.8 Gy
Plan Total Fractions Prescribed: 25
Plan Total Prescribed Dose: 45 Gy
Reference Point Dosage Given to Date: 27 Gy
Reference Point Session Dosage Given: 1.8 Gy
Session Number: 15

## 2023-05-17 NOTE — Telephone Encounter (Signed)
 Pt called c/o severe acid reflux.  Pt stated she's taking Famotadine 40mg  daily but was told to stop the sucralfate  d/t taking Famotidine .  Pt stated she's being woken out of her sleep at night with severe acid reflux.  Pt stated her throat is burning and now she's having a cough.  Pt stated she's taking cough drops to sooth her throat.  Pt stated she's eating dinner by 8 pm at night and provided this nurse with some of the foods she's consuming over the past 3 days.  Pt is eating foods high in fatty acids and acids.  Instructed pt to rest her stomach for awhile by eating a bland diet.  Provided pt with a list of foods to avoid.  Instructed pt to take Tums, not to eat anything after 5 pm, sit upright for 1 to 2 hours after eating, avoid diary, take Maalox, continue to take Famotidine , and avoid drinking sodas/pop.  Pt would like to know if she could take her Sucralfate .  Pt taking daily radiation and didn't mention symptoms to radiation staff.  Notified Dr. Maryalice Smaller of the pt's call.

## 2023-05-18 ENCOUNTER — Other Ambulatory Visit: Payer: Self-pay

## 2023-05-18 ENCOUNTER — Encounter (HOSPITAL_COMMUNITY): Payer: Self-pay | Admitting: Radiology

## 2023-05-18 ENCOUNTER — Ambulatory Visit
Admission: RE | Admit: 2023-05-18 | Discharge: 2023-05-18 | Disposition: A | Discharge status: Home / Self Care | Source: Ambulatory Visit | Attending: Radiation Oncology | Admitting: Radiation Oncology

## 2023-05-18 ENCOUNTER — Ambulatory Visit (HOSPITAL_COMMUNITY)
Admission: RE | Admit: 2023-05-18 | Discharge: 2023-05-18 | Disposition: A | Discharge status: Home / Self Care | Source: Ambulatory Visit | Attending: Radiology | Admitting: Radiology

## 2023-05-18 ENCOUNTER — Ambulatory Visit: Payer: Self-pay

## 2023-05-18 ENCOUNTER — Other Ambulatory Visit: Payer: Self-pay | Admitting: Radiology

## 2023-05-18 DIAGNOSIS — K831 Obstruction of bile duct: Secondary | ICD-10-CM

## 2023-05-18 DIAGNOSIS — C259 Malignant neoplasm of pancreas, unspecified: Secondary | ICD-10-CM

## 2023-05-18 DIAGNOSIS — Z510 Encounter for antineoplastic radiation therapy: Secondary | ICD-10-CM | POA: Diagnosis not present

## 2023-05-18 HISTORY — PX: IR PATIENT EVAL TECH 0-60 MINS: IMG5564

## 2023-05-18 LAB — RAD ONC ARIA SESSION SUMMARY
Course Elapsed Days: 22
Plan Fractions Treated to Date: 16
Plan Prescribed Dose Per Fraction: 1.8 Gy
Plan Total Fractions Prescribed: 25
Plan Total Prescribed Dose: 45 Gy
Reference Point Dosage Given to Date: 28.8 Gy
Reference Point Session Dosage Given: 1.8 Gy
Session Number: 16

## 2023-05-18 NOTE — Assessment & Plan Note (Signed)
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 -She was readmitted to hospital for recurrent nausea and vomiting, likely partial gastric outlet obstruction from pancreatic tumor.  She is able to tolerate full liquid diet, she underwet duodenal stent placement on 01/06/2023 -she started chemo gemcitabine  and abraxane  on 01/26/2023  -her scan was reviewed by Dr. Leighton Punches and her cancer was deemed to be unresectable, this is confirmed by Dr. Zani at [facility]  -She started concurrent chemoradiation with Xeloda  on April 26, 2023. Xeloda  was stopped after 3-4 days due to jaundice  -she developed jaundice on 04/28/2023, underwent PTC on 05/07/2023

## 2023-05-18 NOTE — Telephone Encounter (Signed)
 Returned call to [facility] nurse Ludwig Safer and verified fax number to office, needing signature for orders from December 2024. Confirmed fax number

## 2023-05-18 NOTE — Telephone Encounter (Signed)
Please see call note and advise

## 2023-05-18 NOTE — Progress Notes (Signed)
 Patient ID: Kathryn Underwood, female   DOB: December 01, 1960, 63 y.o.   MRN: [id number] Pt seen in IR dept today for evaluation of some leaking around biliary drain and need for dressing change. Pt has both GB and biliary drains in place.  The internal/external drain was placed on 05/07/2023.  On exam both drains are intact, sutures intact.  There is a small amount of drainage noted from the internal/external biliary drain insertion site.  Both drains were flushed without difficulty .  Significant reflux from insertion sites were not noted with biliary drain or GB drain flushes.  New gauze dressings were applied over both drains.  Patient would benefit from home health assistance with drain maintenance.  She is currently flushing both drains once daily.  Patient has appointment with Dr. Maryalice Smaller tomorrow and follow-up cholangiogram/ drain exchange scheduled with IR on 05/28/2022.

## 2023-05-18 NOTE — Procedures (Signed)
 Allred, Darrell K, PA-C  Physician Assistant Radiology   Progress Notes    Signed   Date of Service: 05/18/2023  4:30 PM   Signed      Patient ID: GINNI EICHLER, female   DOB: 01/25/1961, 63 y.o.   MRN: [id number] Pt seen in IR dept today for evaluation of some leaking around biliary drain and need for dressing change. Pt has both GB and biliary drains in place.  The internal/external drain was placed on 05/07/2023.  On exam both drains are intact, sutures intact.  There is a small amount of drainage noted from the internal/external biliary drain insertion site.  Both drains were flushed without difficulty .  Significant reflux from insertion sites were not noted with biliary drain or GB drain flushes.  New gauze dressings were applied over both drains.  Patient would benefit from home health assistance with drain maintenance.  She is currently flushing both drains once daily.  Patient has appointment with Dr. Maryalice Smaller tomorrow and follow-up cholangiogram/ drain exchange scheduled with IR on 05/28/2022.

## 2023-05-18 NOTE — Telephone Encounter (Signed)
 Ms Kathryn Underwood stated the verbal order was sent in February 2025 and she hasn't heard anything back of os yet. Asking Dr to sign/approve and send information back   Copied from CRM [id number]. Topic: Clinical - Home Health Verbal Orders >> May 18, 2023  9:33 AM Zina Hilts wrote: Caller/Agency: Paige Boatman Callback Number: [phone] Service Requested: Skilled Nursing Frequency: weekly Any new concerns about the patient? No  Ms Kathryn Underwood stated the verbal order was sent in February 2025 and she hasn't heard anything back of os yet. Asking Dr to sign/approve and send information back Reason for Disposition  [1] Follow-up call from patient regarding patient's clinical status AND [2] information NON-URGENT  Answer Assessment - Initial Assessment Questions 1. REASON FOR CALL or QUESTION: "What is your reason for calling today?" or "How can I best help you?" or "What question do you have that I can help answer?"     Ms Kathryn Underwood stated the verbal order was sent in February 2025 and she hasn't heard anything back of os yet. Asking Dr to sign/approve and send information back 2. CALLER: Document the source of call. (e.g., laboratory, patient).     Paige Boatman with Home Health  Protocols used: PCP Call - No Triage-A-AH

## 2023-05-19 ENCOUNTER — Other Ambulatory Visit: Payer: Self-pay

## 2023-05-19 ENCOUNTER — Encounter: Payer: Self-pay | Admitting: Hematology

## 2023-05-19 ENCOUNTER — Telehealth: Payer: Self-pay

## 2023-05-19 ENCOUNTER — Encounter: Payer: Self-pay | Admitting: Nurse Practitioner

## 2023-05-19 ENCOUNTER — Inpatient Hospital Stay

## 2023-05-19 ENCOUNTER — Ambulatory Visit
Admission: RE | Admit: 2023-05-19 | Discharge: 2023-05-19 | Disposition: A | Discharge status: Home / Self Care | Source: Ambulatory Visit | Attending: Radiation Oncology | Admitting: Radiation Oncology

## 2023-05-19 ENCOUNTER — Inpatient Hospital Stay (HOSPITAL_BASED_OUTPATIENT_CLINIC_OR_DEPARTMENT_OTHER): Admitting: Nurse Practitioner

## 2023-05-19 ENCOUNTER — Inpatient Hospital Stay (HOSPITAL_BASED_OUTPATIENT_CLINIC_OR_DEPARTMENT_OTHER): Admitting: Hematology

## 2023-05-19 VITALS — BP 90/64 | HR 111 | Temp 97.5°F | Resp 20 | Ht 63.0 in | Wt 94.3 lb

## 2023-05-19 VITALS — BP 98/73 | HR 92 | Temp 98.1°F | Resp 16

## 2023-05-19 DIAGNOSIS — Z7189 Other specified counseling: Secondary | ICD-10-CM

## 2023-05-19 DIAGNOSIS — R634 Abnormal weight loss: Secondary | ICD-10-CM

## 2023-05-19 DIAGNOSIS — C259 Malignant neoplasm of pancreas, unspecified: Secondary | ICD-10-CM

## 2023-05-19 DIAGNOSIS — Z515 Encounter for palliative care: Secondary | ICD-10-CM

## 2023-05-19 DIAGNOSIS — G893 Neoplasm related pain (acute) (chronic): Secondary | ICD-10-CM | POA: Diagnosis not present

## 2023-05-19 DIAGNOSIS — E860 Dehydration: Secondary | ICD-10-CM

## 2023-05-19 DIAGNOSIS — R630 Anorexia: Secondary | ICD-10-CM

## 2023-05-19 DIAGNOSIS — R530 Neoplastic (malignant) related fatigue: Secondary | ICD-10-CM

## 2023-05-19 DIAGNOSIS — Z95828 Presence of other vascular implants and grafts: Secondary | ICD-10-CM

## 2023-05-19 DIAGNOSIS — T8189XA Other complications of procedures, not elsewhere classified, initial encounter: Secondary | ICD-10-CM

## 2023-05-19 DIAGNOSIS — R110 Nausea: Secondary | ICD-10-CM

## 2023-05-19 DIAGNOSIS — Z510 Encounter for antineoplastic radiation therapy: Secondary | ICD-10-CM | POA: Diagnosis not present

## 2023-05-19 LAB — CBC WITH DIFFERENTIAL (CANCER CENTER ONLY)
Abs Immature Granulocytes: 0.22 10*3/uL — ABNORMAL HIGH (ref 0.00–0.07)
Basophils Absolute: 0.1 10*3/uL (ref 0.0–0.1)
Basophils Relative: 0 %
Eosinophils Absolute: 0.1 10*3/uL (ref 0.0–0.5)
Eosinophils Relative: 0 %
HCT: 32.2 % — ABNORMAL LOW (ref 36.0–46.0)
Hemoglobin: 10.9 g/dL — ABNORMAL LOW (ref 12.0–15.0)
Immature Granulocytes: 1 %
Lymphocytes Relative: 2 %
Lymphs Abs: 0.4 10*3/uL — ABNORMAL LOW (ref 0.7–4.0)
MCH: 32.7 pg (ref 26.0–34.0)
MCHC: 33.9 g/dL (ref 30.0–36.0)
MCV: 96.7 fL (ref 80.0–100.0)
Monocytes Absolute: 1.8 10*3/uL — ABNORMAL HIGH (ref 0.1–1.0)
Monocytes Relative: 10 %
Neutro Abs: 15.8 10*3/uL — ABNORMAL HIGH (ref 1.7–7.7)
Neutrophils Relative %: 87 %
Platelet Count: 338 10*3/uL (ref 150–400)
RBC: 3.33 MIL/uL — ABNORMAL LOW (ref 3.87–5.11)
RDW: 17.2 % — ABNORMAL HIGH (ref 11.5–15.5)
WBC Count: 18.3 10*3/uL — ABNORMAL HIGH (ref 4.0–10.5)
nRBC: 0 % (ref 0.0–0.2)

## 2023-05-19 LAB — CMP (CANCER CENTER ONLY)
ALT: 16 U/L (ref 0–44)
AST: 20 U/L (ref 15–41)
Albumin: 3.5 g/dL (ref 3.5–5.0)
Alkaline Phosphatase: 263 U/L — ABNORMAL HIGH (ref 38–126)
Anion gap: 6 (ref 5–15)
BUN: 21 mg/dL (ref 8–23)
CO2: 33 mmol/L — ABNORMAL HIGH (ref 22–32)
Calcium: 9.9 mg/dL (ref 8.9–10.3)
Chloride: 86 mmol/L — ABNORMAL LOW (ref 98–111)
Creatinine: 0.52 mg/dL (ref 0.44–1.00)
GFR, Estimated: >60 mL/min (ref >60)
Glucose, Bld: 185 mg/dL — ABNORMAL HIGH (ref 70–99)
Potassium: 3.9 mmol/L (ref 3.5–5.1)
Sodium: 125 mmol/L — ABNORMAL LOW (ref 135–145)
Total Bilirubin: 2 mg/dL — ABNORMAL HIGH (ref 0.0–1.2)
Total Protein: 6.7 g/dL (ref 6.5–8.1)

## 2023-05-19 LAB — RAD ONC ARIA SESSION SUMMARY
Course Elapsed Days: 23
Plan Fractions Treated to Date: 17
Plan Prescribed Dose Per Fraction: 1.8 Gy
Plan Total Fractions Prescribed: 25
Plan Total Prescribed Dose: 45 Gy
Reference Point Dosage Given to Date: 30.6 Gy
Reference Point Session Dosage Given: 1.8 Gy
Session Number: 17

## 2023-05-19 MED ORDER — HEPARIN SOD (PORK) LOCK FLUSH 100 UNIT/ML IV SOLN
500.0000 [IU] | Freq: Once | INTRAVENOUS | Status: AC | PRN
Start: 2023-05-19 — End: 2023-05-19
  Administered 2023-05-19: 500 [IU]

## 2023-05-19 MED ORDER — SODIUM CHLORIDE 0.9% FLUSH
10.0000 mL | Freq: Once | INTRAVENOUS | Status: AC
Start: 2023-05-19 — End: 2023-05-19
  Administered 2023-05-19: 10 mL

## 2023-05-19 MED ORDER — DIPHENHYDRAMINE HCL 50 MG/ML IJ SOLN: 50.0000 mg | Freq: Once | INTRAMUSCULAR | Status: DC | PRN

## 2023-05-19 MED ORDER — OXYCODONE HCL 20 MG PO TABS: 20.0000 mg | ORAL_TABLET | ORAL | 0 refills | Status: DC | PRN

## 2023-05-19 MED ORDER — SODIUM CHLORIDE 0.9% FLUSH: 3.0000 mL | Freq: Once | INTRAVENOUS | Status: DC | PRN

## 2023-05-19 MED ORDER — SODIUM CHLORIDE 0.9 % IV SOLN: Freq: Once | INTRAVENOUS | Status: AC

## 2023-05-19 MED ORDER — HEPARIN SOD (PORK) LOCK FLUSH 100 UNIT/ML IV SOLN
500.0000 [IU] | Freq: Once | INTRAVENOUS | Status: AC
  Administered 2023-05-19: 500 [IU]

## 2023-05-19 MED ORDER — SODIUM CHLORIDE 0.9 % IV SOLN: Freq: Once | INTRAVENOUS | Status: DC | PRN

## 2023-05-19 MED ORDER — IRON SUCROSE 20 MG/ML IV SOLN
200.0000 mg | Freq: Once | INTRAVENOUS | Status: DC
  Filled 2023-05-19: qty 10

## 2023-05-19 MED ORDER — ALTEPLASE 2 MG IJ SOLR
2.0000 mg | Freq: Once | INTRAMUSCULAR | Status: DC | PRN
Start: 2023-05-19 — End: 2023-05-19

## 2023-05-19 MED ORDER — SODIUM CHLORIDE 0.9 % IV SOLN: INTRAVENOUS | Status: DC

## 2023-05-19 MED ORDER — EPINEPHRINE 0.3 MG/0.3ML IJ SOAJ: 0.3000 mg | Freq: Once | INTRAMUSCULAR | Status: DC | PRN

## 2023-05-19 MED ORDER — ALBUTEROL SULFATE HFA 108 (90 BASE) MCG/ACT IN AERS: 2.0000 | INHALATION_SPRAY | Freq: Once | RESPIRATORY_TRACT | Status: DC | PRN

## 2023-05-19 MED ORDER — HEPARIN SOD (PORK) LOCK FLUSH 100 UNIT/ML IV SOLN: 500.0000 [IU] | Freq: Once | INTRAVENOUS | Status: DC | PRN

## 2023-05-19 MED ORDER — FAMOTIDINE IN NACL 20-0.9 MG/50ML-% IV SOLN: 20.0000 mg | Freq: Once | INTRAVENOUS | Status: DC | PRN

## 2023-05-19 MED ORDER — SODIUM CHLORIDE 0.9% FLUSH
10.0000 mL | Freq: Once | INTRAVENOUS | Status: AC | PRN
  Administered 2023-05-19: 10 mL

## 2023-05-19 MED ORDER — HEPARIN SOD (PORK) LOCK FLUSH 100 UNIT/ML IV SOLN: 250.0000 [IU] | Freq: Once | INTRAVENOUS | Status: DC | PRN

## 2023-05-19 MED ORDER — MORPHINE SULFATE ER 30 MG PO TBCR: 30.0000 mg | EXTENDED_RELEASE_TABLET | Freq: Three times a day (TID) | ORAL | 0 refills | Status: DC

## 2023-05-19 MED ORDER — METHYLPREDNISOLONE SODIUM SUCC 125 MG IJ SOLR: 125.0000 mg | Freq: Once | INTRAMUSCULAR | Status: DC | PRN

## 2023-05-19 MED ORDER — SODIUM CHLORIDE 0.9% FLUSH: 10.0000 mL | Freq: Once | INTRAVENOUS | Status: DC | PRN

## 2023-05-19 NOTE — Telephone Encounter (Signed)
 Spoke with pt via telephone to inform pt that Dr. Maryalice Smaller would like for the pt to have cultures drawn peripherally and from biliary output d/t pt's WBCs being elevated today.  Informed pt that a lab appt is scheduled at 1015 tomorrow 05/20/2023 after pt's radiation appt.  Pt confirmed appt and had no further questions or concerns.

## 2023-05-19 NOTE — Progress Notes (Signed)
 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 -She was readmitted to hospital for recurrent nausea and vomiting, likely partial gastric outlet obstruction from pancreatic tumor.  She is able to tolerate full liquid diet, she underwet duodenal stent placement on 01/06/2023 -she started chemo gemcitabine  and abraxane  on 01/26/2023  -her scan was reviewed by Dr. Leighton Punches and her cancer was deemed to be unresectable, this is confirmed by Dr. Zani at [facility]  -She started concurrent chemoradiation with Xeloda  on April 26, 2023. Xeloda  was stopped after 3-4 days due to jaundice  -she developed jaundice on 04/28/2023, underwent PTC on 05/07/2023   Assessment & Plan Pancreatic cancer Complications include significant drainage from the liver and gallbladder areas, approximately 24 ounces in 24 hours, with difficulty managing dressing changes at home. - Refer to home health for dressing changes every three days - Instruct nurse to demonstrate dressing change technique to patient and caregiver - Ensure flushing of the bowstring once daily - Encourage adequate fluid intake to compensate for drainage losses  Weight loss Current  weight is 94 pounds. Restricted diet due to GERD may contribute to weight loss. She is concerned about dietary restrictions and food tolerability. - Refer to dietitian for nutritional counseling and management - Encourage trying small amounts of tolerated foods and monitor response - Allow consumption of Ensure for nutritional support  Hyponatremia Sodium level is critically low at 125 mEq/L, contributing to dizziness. Requires intervention to address electrolyte imbalance. - Administer IV fluids to address hyponatremia - Monitor sodium levels closely  Hypotension Low blood pressure contributes to dizziness. Not on antihypertensive medications. Requires fluid resuscitation to stabilize hemodynamics. - Administer IV fluids to stabilize blood pressure - Monitor blood pressure regularly  Dizziness Likely multifactorial, related to hyponatremia, hypotension, and significant weight loss. Requires comprehensive management to address underlying causes. - Administer IV fluids to address contributing factors - Monitor symptoms and adjust treatment as necessary  Gastroesophageal reflux disease (GERD) Managed with a no-fat, no-dairy diet and famotidine . She seeks clarification on dietary restrictions and food tolerability, concerned about nutritional status impact. - Continue famotidine  for GERD management - Refer to dietitian for further dietary guidance - Advise testing individual foods for tolerance, avoiding high-fat and dairy products  Plan -lab reviewed, worsening hyponatremia, likely related to dehydration, will give her 1 L normal saline today - Due to her leukocytosis, I will obtain blood culture and culture of her biliary fluids  - Continue radiation - Nutrition consult - Follow-up next week     SUMMARY OF ONCOLOGIC HISTORY: Oncology History  Pancreatic adenocarcinoma (HCC)  12/12/2022 Cancer Staging   Staging form: Exocrine Pancreas, AJCC 8th Edition - Clinical stage from  12/12/2022: Stage IIA (cT3, cN0, cM0) - Signed by Sonja , MD on 12/28/2022 Total positive nodes: 0   12/16/2022 Initial Diagnosis  Pancreatic adenocarcinoma (HCC)   01/26/2023 -  Chemotherapy   Patient is on Treatment Plan : PANCREATIC Abraxane  D1,8 + Gemcitabine  D1,8 q21d      Genetic Testing   Negative 76 gene [facility] CancerNext-Expanded +RNAinsight panel. The CancerNext-Expanded gene panel offered by [facility] and includes sequencing, rearrangement, and RNA analysis for the following 76 genes: AIP, ALK, APC, ATM, AXIN2, BAP1, BARD1, BMPR1A, BRCA1, BRCA2, BRIP1, CDC73, CDH1, CDK4, CDKN1B, CDKN2A, CEBPA, CHEK2, CTNNA1, DDX41, DICER1, ETV6, FH, FLCN, GATA2, LZTR1, MAX, MBD4, MEN1, MET, MLH1, MSH2, MSH3, MSH6, MUTYH, NF1, NF2, NTHL1, PALB2, PHOX2B, PMS2, POT1, PRKAR1A, PTCH1, PTEN, RAD51C, RAD51D, RB1, RET, RUNX1, SDHA, SDHAF2, SDHB, SDHC, SDHD, SMAD4, SMARCA4, SMARCB1, SMARCE1, STK11, SUFU, TMEM127, TP53, TSC1, TSC2, VHL, and WT1 (sequencing and deletion/duplication); EGFR, HOXB13, KIT, MITF, PDGFRA, POLD1, and POLE (sequencing only); EPCAM and GREM1 (deletion/duplication only). Report date 03/19/23.    03/16/2023 Imaging   CT CAP with pancreatic protocol and CTA imaging - done [facility] Impression:  1. Local advanced infiltrative mass centered within the pancreatic head with extensive vascular involvement, as above.   2. A 7 mm left upper lobe pulmonary nodule is indeterminate.  3. Moderate biliary dilation with obstruction of the level of the extra hepatic bile ducts and possibly owing to obstruction from the pancreatic head mass and/or inflammatory stricture.   4. Multiple pancreatic collections, largest anterior to the pancreatic body, possibly related to prior pancreatitis.  5. Ovoid nodule within the superior right breast is indeterminant. Consider further evaluation with dedicated mammogram as clinically appropriate for this patient.        Discussed the use of AI  scribe software for clinical note transcription with the patient, who gave verbal consent to proceed.  History of Present Illness The patient, a 63 year old female with pancreatic cancer, presents with concerns about her gallbladder and liver bags. She noticed black and wet drainage from the area and attempted to redress it at home. However, she later found that the area was still wet, indicating leakage. She is unsure which bag is leaking but suspects it might be the liver one. She contacted radiology, who redressed the area, but she and her partner are struggling with dressing changes at home. She has not been shown how to do this and is seeking home care assistance for this task.  The patient also reports significant weight loss, now weighing 94 pounds, a weight she has not been since high school. She denies feeling nauseated when she eats but has been taking nausea pills to prevent vomiting, which occurred once recently. She is on a no fat, no dairy diet for reflux and is seeking clarification on what she can eat. She is particularly interested in whether she can consume bread, oat milk, almond milk, and Ensure.  The patient also reports feeling dizzy when she gets up, and her doctor notes that her blood pressure is low. She is drinking a lot of water, but her sodium levels are low, and she is losing a significant amount of fluid through her liver bag, with records showing drainage between 4.5 and 7 ounces, and one instance of 14 ounces in 24 hours.     All other systems were reviewed with the patient and are negative.  MEDICAL HISTORY:  Past Medical History:  Diagnosis Date   Alcohol abuse    Anxiety    Chickenpox    Depression    Neuromuscular disorder (HCC)    neuropathy   Pancreatic cancer (HCC)  Substance abuse [facility])     SURGICAL HISTORY: Past Surgical History:  Procedure Laterality Date   BIOPSY  12/10/2022   Procedure: BIOPSY;  Surgeon: Brice Campi Albino Alu., MD;   Location: [facility];  Service: Gastroenterology;;   CESAREAN SECTION     DUODENAL STENT PLACEMENT N/A 01/06/2023   Procedure: DUODENAL STENT PLACEMENT;  Surgeon: Normie Becton., MD;  Location: [facility];  Service: Gastroenterology;  Laterality: N/A;   ESOPHAGOGASTRODUODENOSCOPY N/A 08/14/2021   Procedure: ESOPHAGOGASTRODUODENOSCOPY (EGD);  Surgeon: Janel Medford, MD;  Location: [facility];  Service: Gastroenterology;  Laterality: N/A;   ESOPHAGOGASTRODUODENOSCOPY N/A 12/12/2022   Procedure: ESOPHAGOGASTRODUODENOSCOPY (EGD);  Surgeon: Normie Becton., MD;  Location: [facility];  Service: Gastroenterology;  Laterality: N/A;   ESOPHAGOGASTRODUODENOSCOPY (EGD) WITH PROPOFOL  N/A 12/10/2022   Procedure: ESOPHAGOGASTRODUODENOSCOPY (EGD) WITH PROPOFOL ;  Surgeon: Brice Campi Albino Alu., MD;  Location: [facility];  Service: Gastroenterology;  Laterality: N/A;   ESOPHAGOGASTRODUODENOSCOPY (EGD) WITH PROPOFOL  N/A 01/06/2023   Procedure: ESOPHAGOGASTRODUODENOSCOPY (EGD) WITH PROPOFOL ;  Surgeon: Brice Campi Albino Alu., MD;  Location: [facility];  Service: Gastroenterology;  Laterality: N/A;  with duodenal stent placement   EUS N/A 08/14/2021   Procedure: UPPER ENDOSCOPIC ULTRASOUND (EUS) RADIAL;  Surgeon: Janel Medford, MD;  Location: [facility];  Service: Gastroenterology;  Laterality: N/A;   EUS N/A 12/12/2022   Procedure: UPPER ENDOSCOPIC ULTRASOUND (EUS) LINEAR;  Surgeon: Normie Becton., MD;  Location: [facility];  Service: Gastroenterology;  Laterality: N/A;   EUS N/A 01/06/2023   Procedure: FULL UPPER ENDOSCOPIC ULTRASOUND (EUS) RADIAL;  Surgeon: Brice Campi Albino Alu., MD;  Location: [facility];  Service: Gastroenterology;  Laterality: N/A;   FINE NEEDLE ASPIRATION N/A 08/14/2021   Procedure: FINE NEEDLE ASPIRATION (FNA) LINEAR;  Surgeon: Janel Medford, MD;  Location: [facility];  Service: Gastroenterology;  Laterality: N/A;   FINE NEEDLE  ASPIRATION N/A 12/12/2022   Procedure: FINE NEEDLE ASPIRATION (FNA) LINEAR;  Surgeon: Normie Becton., MD;  Location: [facility];  Service: Gastroenterology;  Laterality: N/A;   FINE NEEDLE ASPIRATION  01/06/2023   Procedure: FINE NEEDLE ASPIRATION;  Surgeon: Normie Becton., MD;  Location: [facility];  Service: Gastroenterology;;   IR CHOLANGIOGRAM EXISTING TUBE  04/29/2023   IR EXCHANGE BILIARY DRAIN  01/29/2023   IR EXCHANGE BILIARY DRAIN  03/05/2023   IR EXCHANGE BILIARY DRAIN  04/16/2023   IR IMAGING GUIDED PORT INSERTION  12/18/2022   IR INT EXT BILIARY DRAIN WITH CHOLANGIOGRAM  05/07/2023   IR PATIENT EVAL TECH 0-60 MINS  05/18/2023   IR PERC CHOLECYSTOSTOMY  12/13/2022   TONSILLECTOMY      I have reviewed the social history and family history with the patient and they are unchanged from previous note.  ALLERGIES:  has no known allergies.  MEDICATIONS:  Current Outpatient Medications  Medication Sig Dispense Refill   ELIQUIS  5 MG TABS tablet TAKE 1 TABLET BY MOUTH TWICE A DAY 60 tablet 1   Ensure Max Protein (ENSURE MAX PROTEIN) LIQD Take 330 mLs (11 oz total) by mouth 2 (two) times daily.     famotidine  (PEPCID ) 20 MG tablet Take 1 tablet (20 mg total) by mouth 2 (two) times daily. 60 tablet 3   furosemide  (LASIX ) 20 MG tablet TAKE 1 TABLET (20 MG TOTAL) BY MOUTH DAILY. FOR 5 DAYS THEN AS NEEDED FOR LEG EDEMA (Patient not taking: Reported on 05/19/2023) 20 tablet 0   gabapentin  (NEURONTIN ) 300 MG capsule Take 2 capsules (600 mg total) by mouth 2 (two) times daily.  120 capsule 3   lidocaine -prilocaine  (EMLA ) cream Apply to affected area once 30 g 3   mirtazapine  (REMERON ) 45 MG tablet Take 1 tablet (45 mg total) by mouth at bedtime. 30 tablet 3   Multiple Vitamin (MULTIVITAMIN ADULT PO) Take 1 tablet by mouth daily.     ondansetron  (ZOFRAN ) 8 MG tablet Take 1 tablet (8 mg total) by mouth every 8 (eight) hours as needed for nausea or vomiting. 30 tablet 1   potassium  chloride (KLOR-CON  M) 10 MEQ tablet TAKE 1 TABLET (10 MEQ TOTAL) BY MOUTH DAILY. WHEN YOU TAKE LASIX  20 tablet 0   prochlorperazine  (COMPAZINE ) 10 MG tablet Take 1 tablet (10 mg total) by mouth every 6 (six) hours as needed for nausea or vomiting. 30 tablet 1   QUEtiapine  (SEROQUEL ) 100 MG tablet Take 2 tablets (200 mg total) by mouth at bedtime. 30 tablet 3   sodium chloride  flush (NS) 0.9 % SOLN Use 5 - 10 mLs to flush abdominal drain once daily as directed 300 mL 3   Vitamin D , Ergocalciferol , (DRISDOL ) 1.25 MG (50000 UNIT) CAPS capsule Take 1 capsule (50,000 Units total) by mouth every 7 (seven) days. (Patient taking differently: Take 50,000 Units by mouth every 7 (seven) days. Sundays) 5 capsule 6   morphine  (MS CONTIN ) 30 MG 12 hr tablet Take 1 tablet (30 mg total) by mouth every 8 (eight) hours. 90 tablet 0   Oxycodone  HCl 20 MG TABS Take 1 tablet (20 mg total) by mouth every 3 (three) hours as needed. 90 tablet 0   No current facility-administered medications for this visit.   Facility-Administered Medications Ordered in Other Visits  Medication Dose Route Frequency Provider Last Rate Last Admin   0.9 %  sodium chloride  infusion   Intravenous Continuous Sonja Eaton, MD       0.9 %  sodium chloride  infusion   Intravenous Once PRN Sonja Waller, MD       albuterol  (VENTOLIN  HFA) 108 (90 Base) MCG/ACT inhaler 2 puff  2 puff Inhalation Once PRN Sonja Kinsley, MD       alteplase  (CATHFLO ACTIVASE ) injection 2 mg  2 mg Intracatheter Once PRN Sonja [place], MD       diphenhydrAMINE  (BENADRYL ) injection 50 mg  50 mg Intravenous Once PRN Sonja [place], MD       EPINEPHrine  (EPI-PEN) injection 0.3 mg  0.3 mg Intramuscular Once PRN Sonja [place], MD       famotidine  (PEPCID ) IVPB 20 mg premix  20 mg Intravenous Once PRN Sonja [place], MD       heparin  lock flush 100 unit/mL  500 Units Intracatheter Once PRN Sonja [place], MD       heparin  lock flush 100 unit/mL  250 Units Intracatheter Once PRN Sonja [place], MD       iron  sucrose  (VENOFER ) injection 200 mg  200 mg Intravenous Once Sonja [place], MD       methylPREDNISolone  sodium succinate (SOLU-MEDROL ) 125 mg/2 mL injection 125 mg  125 mg Intravenous Once PRN Sonja [place], MD       sodium chloride  flush (NS) 0.9 % injection 10 mL  10 mL Intracatheter Once PRN Sonja [place], MD       sodium chloride  flush (NS) 0.9 % injection 3 mL  3 mL Intracatheter Once PRN Sonja , MD        PHYSICAL EXAMINATION: ECOG PERFORMANCE STATUS: 2 - Symptomatic, <50% confined to bed  Vitals:   05/19/23 0908  BP: 90/64  Pulse: Aaron Aas)  111  Resp: 20  Temp: (!) 97.5 F (36.4 C)  SpO2: 93%   Wt Readings from Last 3 Encounters:  05/19/23 94 lb 4.8 oz (42.8 kg)  05/12/23 102 lb 6.4 oz (46.4 kg)  05/08/23 106 lb 14.8 oz (48.5 kg)     GENERAL:alert, no distress and comfortable SKIN: skin color, texture, turgor are normal, no rashes or significant lesions EYES: normal, Conjunctiva are pink and non-injected, sclera clear NECK: supple, thyroid  normal size, non-tender, without nodularity LYMPH:  no palpable lymphadenopathy in the cervical, axillary  LUNGS: clear to auscultation and percussion with normal breathing effort HEART: regular rate & rhythm and no murmurs and no lower extremity edema ABDOMEN:abdomen soft, non-tender and normal bowel sounds Musculoskeletal:no cyanosis of digits and no clubbing  NEURO: alert & oriented x 3 with fluent speech, no focal motor/sensory deficits  Physical Exam   LABORATORY DATA:  I have reviewed the data as listed    Latest Ref Rng & Units 05/19/2023    8:39 AM 05/12/2023   10:10 AM 05/08/2023    5:58 AM  CBC  WBC 4.0 - 10.5 K/uL 18.3  13.7  13.4   Hemoglobin 12.0 - 15.0 g/dL 82.9  9.9  9.6   Hematocrit 36.0 - 46.0 % 32.2  29.8  28.0   Platelets 150 - 400 K/uL 338  371  337         Latest Ref Rng & Units 05/19/2023    8:39 AM 05/12/2023   10:10 AM 05/09/2023    5:59 AM  CMP  Glucose 70 - 99 mg/dL 562  130  865   BUN 8 - 23 mg/dL 21  23  19     Creatinine 0.44 - 1.00 mg/dL 7.84  6.96  2.95   Sodium 135 - 145 mmol/L 125  131  129   Potassium 3.5 - 5.1 mmol/L 3.9  3.7  3.5   Chloride 98 - 111 mmol/L 86  95  95   CO2 22 - 32 mmol/L 33  30  27   Calcium 8.9 - 10.3 mg/dL 9.9  9.4  8.8   Total Protein 6.5 - 8.1 g/dL 6.7  6.8  6.3   Total Bilirubin 0.0 - 1.2 mg/dL 2.0  3.0  4.1   Alkaline Phos 38 - 126 U/L 263  361  522   AST 15 - 41 U/L 20  14  29    ALT 0 - 44 U/L 16  29  73       RADIOGRAPHIC STUDIES: I have personally reviewed the radiological images as listed and agreed with the findings in the report. IR PATIENT EVAL TECH 0-60 MINS Result Date: 05/18/2023 Adam Holm     05/18/2023  5:05 PM Allred, Boyd Cabal, PA-C Physician Assistant Radiology  Progress Notes   Signed  Date of Service: 05/18/2023  4:30 PM  Signed   Patient ID: MYSTERY SCHRUPP, female   DOB: [birth year], 63 y.o.   MRN: [id number] Pt seen in IR dept today for evaluation of some leaking around biliary drain and need for dressing change. Pt has both GB and biliary drains in place.  The internal/external drain was placed on 05/07/2023.  On exam both drains are intact, sutures intact.  There is a small amount of drainage noted from the internal/external biliary drain insertion site.  Both drains were flushed without difficulty .  Significant reflux from insertion sites were not noted with biliary drain or GB drain flushes.  New gauze dressings  were applied over both drains.  Patient would benefit from home health assistance with drain maintenance.  She is currently flushing both drains once daily.  Patient has appointment with Dr. Maryalice Smaller tomorrow and follow-up cholangiogram/ drain exchange scheduled with IR on 05/28/2022.          Orders Placed This Encounter  Procedures   Ambulatory referral to Home Health    Referral Priority:   Urgent    Referral Type:   Home Health Care    Referral Reason:   Specialty Services Required    Referred to Provider:   [facility]    Requested Specialty:   Home Health Services    Number of Visits Requested:   1   All questions were answered. The patient knows to call the clinic with any problems, questions or concerns. No barriers to learning was detected. The total time spent in the appointment was 30 minutes.     Sonja [employer], MD 05/19/2023

## 2023-05-19 NOTE — Progress Notes (Signed)
 Patient presents today for Normal Saline Bolus infusion.  Patient is in stable condition with no new complaints voiced.  Vital signs are stable.  We will proceed with infusion per provider orders.    Patient tolerated treatment well with no complaints voiced.  Patient via wheelchair with significant other in stable condition.  Vital signs stable at discharge.  Follow up as scheduled.

## 2023-05-19 NOTE — Patient Instructions (Signed)
 [facility] - A DEPT OF [facility]  Discharge Instructions: Thank you for choosing [facility] to provide your oncology and hematology care.   If you have a lab appointment with the Cancer Center, please go directly to the Cancer Center and check in at the registration area.   Wear comfortable clothing and clothing appropriate for easy access to any Portacath or PICC line.   We strive to give you quality time with your provider. You may need to reschedule your appointment if you arrive late (15 or more minutes).  Arriving late affects you and other patients whose appointments are after yours.  Also, if you miss three or more appointments without notifying the office, you may be dismissed from the clinic at the provider's discretion.      For prescription refill requests, have your pharmacy contact our office and allow 72 hours for refills to be completed.    Today you received the IV fluids.  To help prevent nausea and vomiting after your treatment, we encourage you to take your nausea medication as directed.  BELOW ARE SYMPTOMS THAT SHOULD BE REPORTED IMMEDIATELY: *FEVER GREATER THAN 100.4 F (38 C) OR HIGHER *CHILLS OR SWEATING *NAUSEA AND VOMITING THAT IS NOT CONTROLLED WITH YOUR NAUSEA MEDICATION *UNUSUAL SHORTNESS OF BREATH *UNUSUAL BRUISING OR BLEEDING *URINARY PROBLEMS (pain or burning when urinating, or frequent urination) *BOWEL PROBLEMS (unusual diarrhea, constipation, pain near the anus) TENDERNESS IN MOUTH AND THROAT WITH OR WITHOUT PRESENCE OF ULCERS (sore throat, sores in mouth, or a toothache) UNUSUAL RASH, SWELLING OR PAIN  UNUSUAL VAGINAL DISCHARGE OR ITCHING   Items with * indicate a potential emergency and should be followed up as soon as possible or go to the Emergency Department if any problems should occur.  Please show the CHEMOTHERAPY ALERT CARD or IMMUNOTHERAPY ALERT CARD at check-in to the Emergency Department and  triage nurse.  Should you have questions after your visit or need to cancel or reschedule your appointment, please contact [facility] - A DEPT OF [facility]  Dept: [phone]  and follow the prompts.  Office hours are 8:00 a.m. to 4:30 p.m. Monday - Friday. Please note that voicemails left after 4:00 p.m. may not be returned until the following business day.  We are closed weekends and major holidays. You have access to a nurse at all times for urgent questions. Please call the main number to the clinic Dept: [phone] and follow the prompts.   For any non-urgent questions, you may also contact your provider using MyChart. We now offer e-Visits for anyone 79 and older to request care online for non-urgent symptoms. For details visit [email or web address].   Also download the MyChart app! Go to the app store, search "MyChart", open the app, select [facility], and log in with your MyChart username and password.

## 2023-05-19 NOTE — Progress Notes (Signed)
 Palliative Medicine [facility]  [phone] [phone]   Name: Kathryn Underwood Date: 05/19/2023 MRN: [id number]  DOB: [birth year]  Patient Care Team: Allwardt, Deleta Felix, PA-C as PCP - General (Physician Assistant) Leobardo Rainier, MD as Referring Physician (Psychiatry) Sonja [place], MD as Consulting Physician (Hematology and Oncology) Pickenpack-Cousar, Giles Labrum, NP as Nurse Practitioner [facility] and Palliative Medicine)    INTERVAL HISTORY: Kathryn Underwood is a 63 y.o. female with oncologic medical history including pancreatic adenocarcinoma (11/2022) and is status post duodenal stent placement on 01/06/2023. Patient is not a surgical candidate and is currently on palliative systemic therapy of gemcitabine  and abraxane . Palliative ask to see for symptom management and goals of care.   SOCIAL HISTORY:     reports that she has been smoking cigarettes. She has never used smokeless tobacco. She reports that she does not currently use alcohol after a past usage of about 16.0 standard drinks of alcohol per week. She reports that she does not currently use drugs after having used the following drugs: Cocaine and Marijuana.  ADVANCE DIRECTIVES:  None on file   CODE STATUS: DNR  PAST MEDICAL HISTORY: Past Medical History:  Diagnosis Date   Alcohol abuse    Anxiety    Chickenpox    Depression    Neuromuscular disorder (HCC)    neuropathy   Pancreatic cancer (HCC)    Substance abuse (HCC)     ALLERGIES:  has no known allergies.  MEDICATIONS:  Current Outpatient Medications  Medication Sig Dispense Refill   morphine  (MS CONTIN ) 30 MG 12 hr tablet Take 1 tablet (30 mg total) by mouth every 8 (eight) hours. 90 tablet 0   Oxycodone  HCl 20 MG TABS Take 1 tablet (20 mg total) by mouth every 3 (three) hours as needed. 90 tablet 0   ELIQUIS  5 MG TABS tablet TAKE 1 TABLET BY MOUTH TWICE A DAY 60 tablet 1   Ensure Max Protein (ENSURE MAX PROTEIN) LIQD  Take 330 mLs (11 oz total) by mouth 2 (two) times daily.     famotidine  (PEPCID ) 20 MG tablet Take 1 tablet (20 mg total) by mouth 2 (two) times daily. 60 tablet 3   furosemide  (LASIX ) 20 MG tablet TAKE 1 TABLET (20 MG TOTAL) BY MOUTH DAILY. FOR 5 DAYS THEN AS NEEDED FOR LEG EDEMA (Patient not taking: Reported on 05/19/2023) 20 tablet 0   gabapentin  (NEURONTIN ) 300 MG capsule Take 2 capsules (600 mg total) by mouth 2 (two) times daily. 120 capsule 3   lidocaine -prilocaine  (EMLA ) cream Apply to affected area once 30 g 3   mirtazapine  (REMERON ) 45 MG tablet Take 1 tablet (45 mg total) by mouth at bedtime. 30 tablet 3   Multiple Vitamin (MULTIVITAMIN ADULT PO) Take 1 tablet by mouth daily.     ondansetron  (ZOFRAN ) 8 MG tablet Take 1 tablet (8 mg total) by mouth every 8 (eight) hours as needed for nausea or vomiting. 30 tablet 1   potassium chloride  (KLOR-CON  M) 10 MEQ tablet TAKE 1 TABLET (10 MEQ TOTAL) BY MOUTH DAILY. WHEN YOU TAKE LASIX  20 tablet 0   prochlorperazine  (COMPAZINE ) 10 MG tablet Take 1 tablet (10 mg total) by mouth every 6 (six) hours as needed for nausea or vomiting. 30 tablet 1   QUEtiapine  (SEROQUEL ) 100 MG tablet Take 2 tablets (200 mg total) by mouth at bedtime. 30 tablet 3   sodium chloride  flush (NS) 0.9 % SOLN Use 5 - 10 mLs to  flush abdominal drain once daily as directed 300 mL 3   Vitamin D , Ergocalciferol , (DRISDOL ) 1.25 MG (50000 UNIT) CAPS capsule Take 1 capsule (50,000 Units total) by mouth every 7 (seven) days. (Patient taking differently: Take 50,000 Units by mouth every 7 (seven) days. Sundays) 5 capsule 6   No current facility-administered medications for this visit.   Facility-Administered Medications Ordered in Other Visits  Medication Dose Route Frequency Provider Last Rate Last Admin   0.9 %  sodium chloride  infusion   Intravenous Continuous Sonja Kalona, MD       0.9 %  sodium chloride  infusion   Intravenous Once PRN Sonja Brigantine, MD       alteplase  (CATHFLO ACTIVASE )  injection 2 mg  2 mg Intracatheter Once PRN Sonja Donalds, MD       heparin  lock flush 100 unit/mL  500 Units Intracatheter Once PRN Sonja Arnold Line, MD       heparin  lock flush 100 unit/mL  250 Units Intracatheter Once PRN Sonja Condon, MD       sodium chloride  flush (NS) 0.9 % injection 10 mL  10 mL Intracatheter Once PRN Sonja [place], MD       sodium chloride  flush (NS) 0.9 % injection 3 mL  3 mL Intracatheter Once PRN Sonja Ferney, MD        VITAL SIGNS: There were no vitals taken for this visit. There were no vitals filed for this visit.  Estimated body mass index is 16.7 kg/m as calculated from the following:   Height as of an earlier encounter on 05/19/23: 5\' 3"  (1.6 m).   Weight as of an earlier encounter on 05/19/23: 94 lb 4.8 oz (42.8 kg).   PERFORMANCE STATUS (ECOG) : 1 - Symptomatic but completely ambulatory  Physical Exam General: NAD Cardiovascular: regular rate and rhythm Pulmonary: normal breathing pattern Extremities: no edema, no joint deformities Skin: no rashes Neurological: AAO x3  IMPRESSION: Discussed the use of AI scribe software for clinical note transcription with the patient, who gave verbal consent to proceed.  History of Present Illness Kathryn Underwood is a 63 year old female who presents for palliative care follow-up.  No acute distress noted.  She is accompanied by her significant other.  Denies concerns with nausea, vomiting, constipation, or diarrhea. She feels much better overall but acknowledges overexerting herself, leading to episodes of pain. A recent incident in the garden resulted in pain and the need to rest.  Her appetite is good, though she sometimes limits her fluid intake to avoid frequent bathroom trips. She notes a slight weight loss despite good intake.  Current weight is stable at 110 pounds. She experiences peripheral edema, which is managed with compression stockings and leg elevation. She reports improvement in swelling with these measures.  No swelling  noted on exam today. She experiences dry skin on her legs, exacerbated by wearing compression socks, we discussed ways to moisturize skin.  Kathryn Underwood is currently taking Protonix  however this is contraindicated with the use of Xeloda .  We discussed switching to famotidine  daily.  She verbalized understanding.  We discussed her pain at length.  She is currently taking oxycodone  10 mg as needed, anticipating a refill in about five days. She also takes gabapentin , three capsules at bedtime, totaling 900 mg, along with mirtazapine  and Seroquel  at bedtime.  Feels her current pain regimen is managing her pain.  No adjustments needed at this time.  We will continue to closely monitor and adjust accordingly.  All questions answered  and support provided.   Kathryn Underwood is a 63 year old female with pancreatic cancer who presents to clinic for symptom management follow-up.  She is accompanied by her significant other. She experiences acid reflux and sometimes sleeps sitting up to manage symptoms. She takes famotidine  for this condition.  Feels it is effective.   She has a biliary drain with significant output, recording 14 ounces in one instance. The drainage is described as 'brown and black, soaked, wet.' She manages dressing changes herself but finds it challenging.  Recent evaluation by IR due to leakage at biliary drain.  Minimal output from gallbladder drain.  She has had a couple of episodes of vomiting, attributed to dietary choices such as tacos and donut holes. Generally, she does not experience nausea or vomiting regularly. Her energy levels are low, and she spends most of her day in a chair, though she tries to stay active by doing small chores like loading the dishwasher. She uses a rollator to assist with mobility.  Ms. Brazie has experienced significant weight loss, losing seven pounds since Monday, now weighing 92 pounds, down from 102 pounds on April 16th. She follows the [employer], including  foods like Jell-O, rice, and refried beans, and is exploring more options such as white meat chicken and fish. She is interested in consuming almond milk and is considering mango juice as an alternative to orange juice due to its lower acidity.  We discussed her overall nutritional state at length.  Encouraged patient to increase protein as best possible.  She is hopeful to notice some difference in her weight given recent limitations in food choices due to misinterpretation of instructions. Patient has seen Dietician. Encourage to re-engage for additional support.   Her pain is managed with oxycodone , taking two 10 mg tablets every three hours. She is diligent about her medication schedule, counting and recording each dose. She was initiated on morphine  extended-release but ran out as she only received a 7 day supply at discharge. Patient and boyfriend states they were unaware that they needed to contact provider to continue.  She experiences varying levels of pain, sometimes counting the minutes until her next dose as oxycodone  is not as effective as it once was. Noticeable difference since running out of her MS Contin .  Education provided on the use of extended release in addition to breakthrough medication.  Patient is aware we will restart MS Contin  30 mg every 8 hours as previously taking.  Goals of Care Kathryn Underwood expresses feelings of despair and prays for either a miracle or mercy.  She is emotional expressing at times questioning "what am I going through all of this for" referencing she does not seem to be getting better and when she overcomes one  challenge it seems her health suffers from additional decline.  Emotional support provided.  Patient expresses she wishes to continue to treat the treatable allow her every opportunity to continue to thrive  however with understanding that her main focus is on her quality of life.  She identifies there has been some decline.  Patient expresses if health was  to further decline her symptom burden was worsened she would consider focusing on her comfort acknowledging realistic understanding and expectations that her condition is not curative.  Kathryn Underwood denies suicidal ideations at this time.  States at times her pain is severe and she does express desires for "God to have Mercy on me".  Patient and family are aware if symptoms are not well-managed to  contact medical team soon as possible to allow for interventions.  03/24/23: We discussed her current illness and what it means in the larger context of her on-going co-morbidities. Natural disease trajectory and expectations were discussed. Kathryn Underwood and her partner are clear in their understanding of her cancer and disease trajectory. She is able to openly discuss her incurable cancer and that she will face end-of-life in the near future.  She is clear in her expressed wishes to continue to treat the treatable allow her every opportunity to continue to thrive  while focusing on her quality of life.  Her quality of life is most important versus quantity.  She shares that her children's had a difficult time with her prognosis however is now better coping due to the support of Kathryn Underwood, Chaplain.    Patient and significant other is tearful during discussions as they share their thoughts and feelings.  She expressed sadness after having a consultation with [facility] only to be told that her cancer is inoperable.  Patient was previously taking Seroquel  100 mg at bedtime in addition to mirtazapine  45 mg at bedtime as prescribed by her provider at [facility].  Unfortunately she has been unable to get this medication filled due to conflicts and appointments as she has made her cancer treatments a priority.  Multiple attempts to have virtual visits however there seems to be some concern for follow-through.  Will refill medications allow patient to continue without fear of changes due to lack of medication.   I empathetically approach  discussions in regards to healthcare limitations and advanced directives.  Al Alias shares he provided patient with a book that prepares family after the death of a loved one that identifies their wishes as well as any other information they would need to continue with her presents.  Kathryn Underwood states she does not have a completed advanced directive however they are in the process of completing and have upcoming appointment with their attorney.  We discussed at length healthcare directives including life-sustaining measures, artificial feedings, hydration.  Patient is clear in her expressed wishes that her desires for natural death.  She also would not be interested in artificial feedings such as PEG tube placement.  She confirms wishes for DNR/DNI.Patient is aware if assistance is needed with completion of healthcare directives these services are offered here at the cancer.    I discussed the importance of continued conversation with family and their medical providers regarding overall plan of care and treatment options, ensuring decisions are within the context of the patients values and GOCs.  Assessment and Plan Assessment & Plan Pain management Pain managed with oxycodone ; morphine  extended release needed. Advised on dosing adjustments. No constipation but monitoring advised. - Refill oxycodone  10 mg every 4 hours as needed [facility]. - Continue gabapentin  900 mg at bedtime. - [facility] morphine  extended [facility], dosing every eight hours for thirty days. - Send oxycodone  prescription to [facility], adjust dosage to avoid taking two tablets at a time. - Advise monitoring for constipation and start stool softener if bowel movements decrease.  Biliary drain management Biliary drain output significant. Reports black stool and potential infection concerns. Pain and leakage at drain site. Requires home health care for dressing changes. - Arrange home health care for dressing changes twice a week  pending insurance approval.  Weight loss Significant weight loss noted. Misunderstanding about dietary restrictions corrected. Encouraged to expand diet to prevent further weight loss. - Encourage varied diet including bananas, cooked apples, white meat  chicken, fish, and mango juice. - Monitor weight and dietary intake closely. - Reconnect patient with dietitian.  Depression Expresses hopelessness and emotional distress. No active suicidal ideation. Emotional strain present for her and spouse. - Encourage open communication with care team about emotional well-being. - Provide support and reassurance, emphasizing quality of life. - Advise contacting office if emotional distress worsens.  Gastroesophageal reflux disease Acid reflux managed with famotidine . Symptoms worsened by certain sleep positions. - Continue famotidine  for acid reflux management. - Advise sleeping in a reclined position to minimize reflux symptoms.  Goals of Care Focus on quality of life and symptom management. Emotional and spiritual discussions ongoing. Extensive goals of care discussion. She is clear in her expressed wishes to continue to treat the treatable allow her every opportunity to continue to thrive  while focusing on her quality of life.  Her quality of life is most important versus quantity.  - Consider travel plans during chemotherapy break if approved by oncology. -Patient and significant other are in the process of completing advanced directives with their attorney.  States she has an appointment on Monday. -She is aware if she needs assistance with completing her healthcare advanced directives this service is offered here at the cancer center.  Extensive education provided on document. -Patient is clear in her expressed wishes for no life-sustaining measures as her desire is for natural death.  Confirms DNR/DNI. - Reaffirm focus on quality of life and symptom management. - Encourage discussions about  emotional and spiritual well-being.  Follow-up Upcoming appointments and treatments scheduled. - Schedule follow-up appointment for April 30th.  Patient expressed understanding and was in agreement with this plan. She also understands that She can call the clinic at any time with any questions, concerns, or complaints.   Any controlled substances utilized were prescribed in the context of palliative care. PDMP has been reviewed.   Visit consisted of counseling and education dealing with the complex and emotionally intense issues of symptom management and palliative care in the setting of serious and potentially life-threatening illness.  Dellia Ferguson, AGPCNP-BC  Palliative Medicine Team/[facility]

## 2023-05-20 ENCOUNTER — Inpatient Hospital Stay

## 2023-05-20 ENCOUNTER — Other Ambulatory Visit: Payer: Self-pay

## 2023-05-20 ENCOUNTER — Other Ambulatory Visit (HOSPITAL_COMMUNITY): Payer: Self-pay

## 2023-05-20 ENCOUNTER — Ambulatory Visit

## 2023-05-20 NOTE — Progress Notes (Signed)
 Xeloda  was discontinued due to jaundice on 05/04/23.  Disenrolling from Specialty Pharmacy services.

## 2023-05-20 NOTE — Progress Notes (Signed)
 Late Entry:  Verbal order w/readback from Dr. Maryalice Smaller for referral to [facility].  Referral order placed on 05/19/2023 and awaiting acceptance of patient from [facility].

## 2023-05-21 ENCOUNTER — Ambulatory Visit
Admission: RE | Admit: 2023-05-21 | Discharge: 2023-05-21 | Disposition: A | Discharge status: Home / Self Care | Source: Ambulatory Visit | Attending: Radiation Oncology | Admitting: Radiation Oncology

## 2023-05-21 ENCOUNTER — Other Ambulatory Visit: Payer: Self-pay

## 2023-05-21 DIAGNOSIS — Z510 Encounter for antineoplastic radiation therapy: Secondary | ICD-10-CM | POA: Diagnosis not present

## 2023-05-21 LAB — RAD ONC ARIA SESSION SUMMARY
Course Elapsed Days: 25
Plan Fractions Treated to Date: 18
Plan Prescribed Dose Per Fraction: 1.8 Gy
Plan Total Fractions Prescribed: 25
Plan Total Prescribed Dose: 45 Gy
Reference Point Dosage Given to Date: 32.4 Gy
Reference Point Session Dosage Given: 1.8 Gy
Session Number: 18

## 2023-05-24 ENCOUNTER — Other Ambulatory Visit: Payer: Self-pay

## 2023-05-24 ENCOUNTER — Ambulatory Visit
Admission: RE | Admit: 2023-05-24 | Discharge: 2023-05-24 | Disposition: A | Discharge status: Home / Self Care | Source: Ambulatory Visit | Attending: Radiation Oncology

## 2023-05-24 ENCOUNTER — Telehealth: Payer: Self-pay

## 2023-05-24 LAB — RAD ONC ARIA SESSION SUMMARY
Course Elapsed Days: 28
Plan Fractions Treated to Date: 19
Plan Prescribed Dose Per Fraction: 1.8 Gy
Plan Total Fractions Prescribed: 25
Plan Total Prescribed Dose: 45 Gy
Reference Point Dosage Given to Date: 34.2 Gy
Reference Point Session Dosage Given: 1.8 Gy
Session Number: 19

## 2023-05-24 NOTE — Telephone Encounter (Signed)
 Pt called over the weekend and LVM reporting her biliary drain was having significant output,normal color and no pain or swelling to the area. RN called back this morning to check in and pt reports that the drainage has slowed down, still does not have any pain, swelling, or irritation to the area. Pt to call back with any concerns, pt verbalized underrating, no further needs at this time.

## 2023-05-25 ENCOUNTER — Encounter: Payer: Self-pay | Admitting: Hematology

## 2023-05-25 ENCOUNTER — Ambulatory Visit
Admission: RE | Admit: 2023-05-25 | Discharge: 2023-05-25 | Disposition: A | Discharge status: Home / Self Care | Source: Ambulatory Visit | Attending: Radiation Oncology

## 2023-05-25 ENCOUNTER — Other Ambulatory Visit: Payer: Self-pay

## 2023-05-25 ENCOUNTER — Other Ambulatory Visit (HOSPITAL_COMMUNITY): Payer: Self-pay

## 2023-05-25 LAB — RAD ONC ARIA SESSION SUMMARY
Course Elapsed Days: 29
Plan Fractions Treated to Date: 20
Plan Prescribed Dose Per Fraction: 1.8 Gy
Plan Total Fractions Prescribed: 25
Plan Total Prescribed Dose: 45 Gy
Reference Point Dosage Given to Date: 36 Gy
Reference Point Session Dosage Given: 1.8 Gy
Session Number: 20

## 2023-05-25 NOTE — Assessment & Plan Note (Signed)
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 -She was readmitted to hospital for recurrent nausea and vomiting, likely partial gastric outlet obstruction from pancreatic tumor.  She is able to tolerate full liquid diet, she underwet duodenal stent placement on 01/06/2023 -she started chemo gemcitabine  and abraxane  on 01/26/2023  -her scan was reviewed by Dr. Leighton Punches and her cancer was deemed to be unresectable, this is confirmed by Dr. Zani at [facility]  -She started concurrent chemoradiation with Xeloda  on April 26, 2023. Xeloda  was stopped after 3-4 days due to jaundice  -she developed jaundice on 04/28/2023, underwent PTC on 05/07/2023

## 2023-05-26 ENCOUNTER — Inpatient Hospital Stay (HOSPITAL_COMMUNITY)
Admission: EM | Admit: 2023-05-26 | Discharge: 2023-05-28 | DRG: 314 | Disposition: A | Discharge status: Hospice | Attending: Internal Medicine | Admitting: Internal Medicine

## 2023-05-26 ENCOUNTER — Other Ambulatory Visit: Payer: Self-pay

## 2023-05-26 ENCOUNTER — Ambulatory Visit
Admission: RE | Admit: 2023-05-26 | Discharge: 2023-05-26 | Disposition: A | Discharge status: Home / Self Care | Source: Ambulatory Visit | Attending: Radiation Oncology

## 2023-05-26 ENCOUNTER — Inpatient Hospital Stay: Admitting: Dietician

## 2023-05-26 ENCOUNTER — Inpatient Hospital Stay

## 2023-05-26 ENCOUNTER — Inpatient Hospital Stay (HOSPITAL_BASED_OUTPATIENT_CLINIC_OR_DEPARTMENT_OTHER): Admitting: Hematology

## 2023-05-26 ENCOUNTER — Encounter: Payer: Self-pay | Admitting: Hematology

## 2023-05-26 ENCOUNTER — Ambulatory Visit

## 2023-05-26 ENCOUNTER — Encounter (HOSPITAL_COMMUNITY): Payer: Self-pay

## 2023-05-26 VITALS — BP 86/54 | HR 60 | Temp 98.3°F | Resp 16 | Ht 63.0 in | Wt 94.1 lb

## 2023-05-26 DIAGNOSIS — Z8261 Family history of arthritis: Secondary | ICD-10-CM

## 2023-05-26 DIAGNOSIS — Z820 Family history of epilepsy and other diseases of the nervous system: Secondary | ICD-10-CM

## 2023-05-26 DIAGNOSIS — F332 Major depressive disorder, recurrent severe without psychotic features: Secondary | ICD-10-CM | POA: Diagnosis present

## 2023-05-26 DIAGNOSIS — Z9221 Personal history of antineoplastic chemotherapy: Secondary | ICD-10-CM

## 2023-05-26 DIAGNOSIS — D7589 Other specified diseases of blood and blood-forming organs: Secondary | ICD-10-CM | POA: Diagnosis present

## 2023-05-26 DIAGNOSIS — E860 Dehydration: Secondary | ICD-10-CM | POA: Diagnosis present

## 2023-05-26 DIAGNOSIS — F17210 Nicotine dependence, cigarettes, uncomplicated: Secondary | ICD-10-CM | POA: Diagnosis present

## 2023-05-26 DIAGNOSIS — I9589 Other hypotension: Principal | ICD-10-CM | POA: Diagnosis present

## 2023-05-26 DIAGNOSIS — C259 Malignant neoplasm of pancreas, unspecified: Secondary | ICD-10-CM

## 2023-05-26 DIAGNOSIS — Z79899 Other long term (current) drug therapy: Secondary | ICD-10-CM

## 2023-05-26 DIAGNOSIS — Z681 Body mass index (BMI) 19 or less, adult: Secondary | ICD-10-CM

## 2023-05-26 DIAGNOSIS — F1411 Cocaine abuse, in remission: Secondary | ICD-10-CM | POA: Diagnosis present

## 2023-05-26 DIAGNOSIS — I5189 Other ill-defined heart diseases: Secondary | ICD-10-CM

## 2023-05-26 DIAGNOSIS — D72829 Elevated white blood cell count, unspecified: Secondary | ICD-10-CM | POA: Diagnosis present

## 2023-05-26 DIAGNOSIS — J449 Chronic obstructive pulmonary disease, unspecified: Secondary | ICD-10-CM | POA: Diagnosis present

## 2023-05-26 DIAGNOSIS — Z825 Family history of asthma and other chronic lower respiratory diseases: Secondary | ICD-10-CM

## 2023-05-26 DIAGNOSIS — E861 Hypovolemia: Principal | ICD-10-CM | POA: Diagnosis present

## 2023-05-26 DIAGNOSIS — R45851 Suicidal ideations: Secondary | ICD-10-CM | POA: Diagnosis present

## 2023-05-26 DIAGNOSIS — I748 Embolism and thrombosis of other arteries: Secondary | ICD-10-CM | POA: Diagnosis present

## 2023-05-26 DIAGNOSIS — F419 Anxiety disorder, unspecified: Secondary | ICD-10-CM | POA: Diagnosis present

## 2023-05-26 DIAGNOSIS — Z515 Encounter for palliative care: Secondary | ICD-10-CM

## 2023-05-26 DIAGNOSIS — G893 Neoplasm related pain (acute) (chronic): Secondary | ICD-10-CM | POA: Diagnosis present

## 2023-05-26 DIAGNOSIS — D649 Anemia, unspecified: Secondary | ICD-10-CM | POA: Diagnosis present

## 2023-05-26 DIAGNOSIS — F1120 Opioid dependence, uncomplicated: Secondary | ICD-10-CM | POA: Diagnosis present

## 2023-05-26 DIAGNOSIS — Z66 Do not resuscitate: Secondary | ICD-10-CM | POA: Diagnosis present

## 2023-05-26 DIAGNOSIS — D630 Anemia in neoplastic disease: Secondary | ICD-10-CM | POA: Diagnosis present

## 2023-05-26 DIAGNOSIS — E43 Unspecified severe protein-calorie malnutrition: Secondary | ICD-10-CM | POA: Diagnosis present

## 2023-05-26 DIAGNOSIS — Z833 Family history of diabetes mellitus: Secondary | ICD-10-CM

## 2023-05-26 DIAGNOSIS — I959 Hypotension, unspecified: Secondary | ICD-10-CM | POA: Diagnosis present

## 2023-05-26 DIAGNOSIS — E559 Vitamin D deficiency, unspecified: Secondary | ICD-10-CM | POA: Diagnosis present

## 2023-05-26 DIAGNOSIS — Z7901 Long term (current) use of anticoagulants: Secondary | ICD-10-CM

## 2023-05-26 DIAGNOSIS — R54 Age-related physical debility: Secondary | ICD-10-CM | POA: Diagnosis present

## 2023-05-26 DIAGNOSIS — R64 Cachexia: Secondary | ICD-10-CM | POA: Diagnosis present

## 2023-05-26 DIAGNOSIS — E871 Hypo-osmolality and hyponatremia: Secondary | ICD-10-CM | POA: Diagnosis present

## 2023-05-26 DIAGNOSIS — F418 Other specified anxiety disorders: Secondary | ICD-10-CM | POA: Diagnosis present

## 2023-05-26 DIAGNOSIS — R0689 Other abnormalities of breathing: Secondary | ICD-10-CM | POA: Diagnosis present

## 2023-05-26 DIAGNOSIS — E876 Hypokalemia: Secondary | ICD-10-CM | POA: Diagnosis present

## 2023-05-26 DIAGNOSIS — R627 Adult failure to thrive: Secondary | ICD-10-CM | POA: Diagnosis present

## 2023-05-26 LAB — COMPREHENSIVE METABOLIC PANEL WITH GFR
ALT: 12 U/L (ref 0–44)
AST: 17 U/L (ref 15–41)
Albumin: 2.9 g/dL — ABNORMAL LOW (ref 3.5–5.0)
Alkaline Phosphatase: 163 U/L — ABNORMAL HIGH (ref 38–126)
Anion gap: 11 (ref 5–15)
BUN: 45 mg/dL — ABNORMAL HIGH (ref 8–23)
CO2: 38 mmol/L — ABNORMAL HIGH (ref 22–32)
Calcium: 8.4 mg/dL — ABNORMAL LOW (ref 8.9–10.3)
Chloride: 70 mmol/L — ABNORMAL LOW (ref 98–111)
Creatinine, Ser: 0.98 mg/dL (ref 0.44–1.00)
GFR, Estimated: >60 mL/min (ref >60)
Glucose, Bld: 127 mg/dL — ABNORMAL HIGH (ref 70–99)
Potassium: 3.3 mmol/L — ABNORMAL LOW (ref 3.5–5.1)
Sodium: 120 mmol/L — ABNORMAL LOW (ref 135–145)
Total Bilirubin: 1.6 mg/dL — ABNORMAL HIGH (ref 0.0–1.2)
Total Protein: 6.7 g/dL (ref 6.5–8.1)

## 2023-05-26 LAB — RAD ONC ARIA SESSION SUMMARY
Course Elapsed Days: 30
Plan Fractions Treated to Date: 21
Plan Prescribed Dose Per Fraction: 1.8 Gy
Plan Total Fractions Prescribed: 25
Plan Total Prescribed Dose: 45 Gy
Reference Point Dosage Given to Date: 37.8 Gy
Reference Point Session Dosage Given: 1.8 Gy
Session Number: 21

## 2023-05-26 LAB — URINALYSIS, ROUTINE W REFLEX MICROSCOPIC
Bilirubin Urine: NEGATIVE
Glucose, UA: NEGATIVE mg/dL
Hgb urine dipstick: NEGATIVE
Ketones, ur: NEGATIVE mg/dL
Leukocytes,Ua: NEGATIVE
Nitrite: NEGATIVE
Protein, ur: NEGATIVE mg/dL
Specific Gravity, Urine: 1.011 (ref 1.005–1.030)
pH: 6 (ref 5.0–8.0)

## 2023-05-26 LAB — CBC
HCT: 31.3 % — ABNORMAL LOW (ref 36.0–46.0)
Hemoglobin: 10.3 g/dL — ABNORMAL LOW (ref 12.0–15.0)
MCH: 32.9 pg (ref 26.0–34.0)
MCHC: 32.9 g/dL (ref 30.0–36.0)
MCV: 100 fL (ref 80.0–100.0)
Platelets: 258 10*3/uL (ref 150–400)
RBC: 3.13 MIL/uL — ABNORMAL LOW (ref 3.87–5.11)
RDW: 16.2 % — ABNORMAL HIGH (ref 11.5–15.5)
WBC: 21.6 10*3/uL — ABNORMAL HIGH (ref 4.0–10.5)
nRBC: 0 % (ref 0.0–0.2)

## 2023-05-26 LAB — BASIC METABOLIC PANEL WITH GFR
Anion gap: 10 (ref 5–15)
BUN: 37 mg/dL — ABNORMAL HIGH (ref 8–23)
CO2: 33 mmol/L — ABNORMAL HIGH (ref 22–32)
Calcium: 8.1 mg/dL — ABNORMAL LOW (ref 8.9–10.3)
Chloride: 82 mmol/L — ABNORMAL LOW (ref 98–111)
Creatinine, Ser: 0.5 mg/dL (ref 0.44–1.00)
GFR, Estimated: >60 mL/min (ref >60)
Glucose, Bld: 147 mg/dL — ABNORMAL HIGH (ref 70–99)
Potassium: 3.6 mmol/L (ref 3.5–5.1)
Sodium: 125 mmol/L — ABNORMAL LOW (ref 135–145)

## 2023-05-26 LAB — MAGNESIUM: Magnesium: 2.3 mg/dL (ref 1.7–2.4)

## 2023-05-26 LAB — SODIUM, URINE, RANDOM: Sodium, Ur: <10 mmol/L

## 2023-05-26 LAB — OSMOLALITY, URINE: Osmolality, Ur: 297 mosm/kg — ABNORMAL LOW (ref 300–900)

## 2023-05-26 MED ORDER — PROCHLORPERAZINE EDISYLATE 10 MG/2ML IJ SOLN: 5.0000 mg | INTRAMUSCULAR | Status: DC | PRN

## 2023-05-26 MED ORDER — FAMOTIDINE 20 MG PO TABS: 20.0000 mg | ORAL_TABLET | Freq: Two times a day (BID) | ORAL | Status: DC

## 2023-05-26 MED ORDER — ONDANSETRON HCL 4 MG PO TABS: 4.0000 mg | ORAL_TABLET | Freq: Four times a day (QID) | ORAL | Status: DC | PRN

## 2023-05-26 MED ORDER — OXYCODONE HCL 20 MG PO TABS: 20.0000 mg | ORAL_TABLET | ORAL | Status: DC | PRN

## 2023-05-26 MED ORDER — LACTATED RINGERS IV BOLUS
1000.0000 mL | Freq: Once | INTRAVENOUS | Status: AC
  Administered 2023-05-26: 1000 mL via INTRAVENOUS

## 2023-05-26 MED ORDER — MORPHINE SULFATE ER 15 MG PO TBCR
30.0000 mg | EXTENDED_RELEASE_TABLET | Freq: Three times a day (TID) | ORAL | Status: DC
  Administered 2023-05-26 – 2023-05-28 (×7): 30 mg via ORAL
  Filled 2023-05-26 (×7): qty 2

## 2023-05-26 MED ORDER — APIXABAN 5 MG PO TABS
5.0000 mg | ORAL_TABLET | Freq: Two times a day (BID) | ORAL | Status: DC
  Administered 2023-05-26 – 2023-05-28 (×4): 5 mg via ORAL
  Filled 2023-05-26 (×4): qty 1

## 2023-05-26 MED ORDER — SODIUM CHLORIDE 0.9 % IV BOLUS
1000.0000 mL | Freq: Once | INTRAVENOUS | Status: AC
  Administered 2023-05-26: 1000 mL via INTRAVENOUS

## 2023-05-26 MED ORDER — ENSURE MAX PROTEIN PO LIQD
11.0000 [oz_av] | Freq: Two times a day (BID) | ORAL | Status: DC
  Administered 2023-05-26 – 2023-05-28 (×4): 11 [oz_av] via ORAL
  Filled 2023-05-26 (×4): qty 330

## 2023-05-26 MED ORDER — POLYETHYLENE GLYCOL 3350 17 G PO PACK: 17.0000 g | PACK | Freq: Every day | ORAL | Status: DC | PRN

## 2023-05-26 MED ORDER — ONDANSETRON HCL 4 MG/2ML IJ SOLN: 4.0000 mg | Freq: Four times a day (QID) | INTRAMUSCULAR | Status: DC | PRN

## 2023-05-26 MED ORDER — GABAPENTIN 300 MG PO CAPS
900.0000 mg | ORAL_CAPSULE | Freq: Every day | ORAL | Status: DC
  Administered 2023-05-26 – 2023-05-27 (×2): 900 mg via ORAL
  Filled 2023-05-26 (×2): qty 3

## 2023-05-26 MED ORDER — ACETAMINOPHEN 650 MG RE SUPP: 650.0000 mg | Freq: Four times a day (QID) | RECTAL | Status: DC | PRN

## 2023-05-26 MED ORDER — SODIUM CHLORIDE 0.9 % IV BOLUS
500.0000 mL | Freq: Once | INTRAVENOUS | Status: AC
  Administered 2023-05-26: 500 mL via INTRAVENOUS

## 2023-05-26 MED ORDER — FAMOTIDINE 20 MG PO TABS
20.0000 mg | ORAL_TABLET | Freq: Every day | ORAL | Status: DC
  Administered 2023-05-27 – 2023-05-28 (×2): 20 mg via ORAL
  Filled 2023-05-26 (×2): qty 1

## 2023-05-26 MED ORDER — ACETAMINOPHEN 325 MG PO TABS: 650.0000 mg | ORAL_TABLET | Freq: Four times a day (QID) | ORAL | Status: DC | PRN

## 2023-05-26 MED ORDER — QUETIAPINE FUMARATE 100 MG PO TABS
200.0000 mg | ORAL_TABLET | Freq: Every day | ORAL | Status: DC
  Administered 2023-05-26 – 2023-05-27 (×2): 200 mg via ORAL
  Filled 2023-05-26 (×2): qty 2

## 2023-05-26 MED ORDER — POTASSIUM CHLORIDE 20 MEQ PO PACK
40.0000 meq | PACK | Freq: Two times a day (BID) | ORAL | Status: DC
  Administered 2023-05-26 – 2023-05-27 (×4): 40 meq via ORAL
  Filled 2023-05-26 (×4): qty 2

## 2023-05-26 MED ORDER — OXYCODONE HCL 5 MG PO TABS
20.0000 mg | ORAL_TABLET | ORAL | Status: DC | PRN
  Administered 2023-05-27 – 2023-05-28 (×4): 20 mg via ORAL
  Filled 2023-05-26 (×4): qty 4

## 2023-05-26 MED ORDER — SODIUM CHLORIDE 0.9 % IV SOLN: INTRAVENOUS | Status: AC

## 2023-05-26 MED ORDER — MIRTAZAPINE 15 MG PO TABS
45.0000 mg | ORAL_TABLET | Freq: Every day | ORAL | Status: DC
  Administered 2023-05-26 – 2023-05-27 (×2): 45 mg via ORAL
  Filled 2023-05-26: qty 2
  Filled 2023-05-26: qty 3

## 2023-05-26 NOTE — ED Triage Notes (Signed)
 Pt brought in from cancer center for low BP and further evaluation. Pt has a biliary drain.

## 2023-05-26 NOTE — ED Notes (Signed)
 Pt on bedside commode.

## 2023-05-26 NOTE — ED Provider Notes (Signed)
 [place] EMERGENCY DEPARTMENT AT [facility] Provider Note   CSN: [id number] Arrival date & time: 05/26/23  1029     History  Chief Complaint  Patient presents with   Hypotension    Kathryn Underwood is a 63 y.o. female.  Pt w hx pancreatic cancer, presents from cancer center with general weakness and low blood pressure. Pt indicates is eating/drinking, but not that much. No vomiting or diarrhea. Denies fever or chills. No cough or uri symptoms. No chest pain or discomfort. No sob or worsening doe. No new or worsening abdominal pain. Indicates biliary drains are draining normal amount. No dysuria or gu c/o. No extremity pain or swelling. No blood loss, rectal bleeding or melena.   The history is provided by the patient and medical records.       Home Medications Prior to Admission medications   Medication Sig Start Date End Date Taking? Authorizing Provider  ELIQUIS  5 MG TABS tablet TAKE 1 TABLET BY MOUTH TWICE A DAY 04/09/23   Sharyon Deis, NP  Ensure Max Protein (ENSURE MAX PROTEIN) LIQD Take 330 mLs (11 oz total) by mouth 2 (two) times daily. 12/18/22   Gonfa, Taye T, MD  famotidine  (PEPCID ) 20 MG tablet Take 1 tablet (20 mg total) by mouth 2 (two) times daily. 04/21/23   Pickenpack-Cousar, Athena N, NP  furosemide  (LASIX ) 20 MG tablet TAKE 1 TABLET (20 MG TOTAL) BY MOUTH DAILY. FOR 5 DAYS THEN AS NEEDED FOR LEG EDEMA Patient not taking: Reported on 05/19/2023 04/19/23   Sonja Milton, MD  gabapentin  (NEURONTIN ) 300 MG capsule Take 2 capsules (600 mg total) by mouth 2 (two) times daily. 03/24/23   Pickenpack-Cousar, Athena N, NP  lidocaine -prilocaine  (EMLA ) cream Apply to affected area once 12/28/22   Sonja [place], MD  mirtazapine  (REMERON ) 45 MG tablet Take 1 tablet (45 mg total) by mouth at bedtime. 03/24/23   Pickenpack-Cousar, Athena N, NP  morphine  (MS CONTIN ) 30 MG 12 hr tablet Take 1 tablet (30 mg total) by mouth every 8 (eight) hours. 05/19/23   Pickenpack-Cousar,  Athena N, NP  Multiple Vitamin (MULTIVITAMIN ADULT PO) Take 1 tablet by mouth daily.    [provider]  ondansetron  (ZOFRAN ) 8 MG tablet Take 1 tablet (8 mg total) by mouth every 8 (eight) hours as needed for nausea or vomiting. 12/28/22   Sonja Corrigan, MD  Oxycodone  HCl 20 MG TABS Take 1 tablet (20 mg total) by mouth every 3 (three) hours as needed. 05/19/23   Pickenpack-Cousar, Athena N, NP  potassium chloride  (KLOR-CON  M) 10 MEQ tablet TAKE 1 TABLET (10 MEQ TOTAL) BY MOUTH DAILY. WHEN YOU TAKE LASIX  04/19/23   Sonja Kinsey, MD  prochlorperazine  (COMPAZINE ) 10 MG tablet Take 1 tablet (10 mg total) by mouth every 6 (six) hours as needed for nausea or vomiting. 12/28/22   Sonja [place], MD  QUEtiapine  (SEROQUEL ) 100 MG tablet Take 2 tablets (200 mg total) by mouth at bedtime. 03/24/23   Pickenpack-Cousar, Giles Labrum, NP  sodium chloride  flush (NS) 0.9 % SOLN Use 5 - 10 mLs to flush abdominal drain once daily as directed 12/18/22   Allred, Darrell K, PA-C  Vitamin D , Ergocalciferol , (DRISDOL ) 1.25 MG (50000 UNIT) CAPS capsule Take 1 capsule (50,000 Units total) by mouth every 7 (seven) days. Patient taking differently: Take 50,000 Units by mouth every 7 (seven) days. Sundays 01/02/23   Barbee Lew, MD      Allergies    Patient has no known  allergies.    Review of Systems   Review of Systems  Constitutional:  Negative for chills and fever.  HENT:  Negative for sore throat.   Respiratory:  Negative for cough and shortness of breath.   Cardiovascular:  Negative for chest pain.  Gastrointestinal:  Negative for blood in stool, diarrhea and vomiting.  Genitourinary:  Negative for dysuria and flank pain.  Musculoskeletal:  Negative for neck pain and neck stiffness.  Skin:  Negative for rash.  Neurological:  Negative for headaches.    Physical Exam Updated Vital Signs BP 97/75   Pulse 88   Temp (!) 97.5 F (36.4 C) (Oral)   Resp 18   Ht 1.6 m (5\' 3" )   Wt 42.7 kg   SpO2 95%   BMI 16.67  kg/m  Physical Exam Vitals and nursing note reviewed.  Constitutional:      Appearance: She is well-developed.     Comments: Frail, chronically ill appearing.   HENT:     Head: Atraumatic.     Nose: Nose normal.     Mouth/Throat:     Mouth: Mucous membranes are moist.  Eyes:     General: No scleral icterus.    Conjunctiva/sclera: Conjunctivae normal.     Pupils: Pupils are equal, round, and reactive to light.  Neck:     Trachea: No tracheal deviation.  Cardiovascular:     Rate and Rhythm: Normal rate and regular rhythm.     Pulses: Normal pulses.     Heart sounds: Normal heart sounds. No murmur heard.    No friction rub. No gallop.  Pulmonary:     Effort: Pulmonary effort is normal. No respiratory distress.     Breath sounds: Normal breath sounds.     Comments: Port left chest without sign of infection.  Abdominal:     General: Bowel sounds are normal.     Palpations: Abdomen is soft.     Tenderness: There is no abdominal tenderness. There is no guarding.     Comments: Mild distension. Biliary drains intact draining bile into bag.   Genitourinary:    Comments: No cva tenderness.  Musculoskeletal:        General: No swelling or tenderness.     Cervical back: Normal range of motion and neck supple. No rigidity. No muscular tenderness.     Right lower leg: No edema.     Left lower leg: No edema.  Skin:    General: Skin is warm and dry.     Findings: No rash.  Neurological:     Comments: Alert, speech normal.   Psychiatric:        Mood and Affect: Mood normal.     ED Results / Procedures / Treatments   Labs (all labs ordered are listed, but only abnormal results are displayed) Results for orders placed or performed during the hospital encounter of 05/26/23  CBC   Collection Time: 05/26/23 10:47 AM  Result Value Ref Range   WBC 21.6 (H) 4.0 - 10.5 K/uL   RBC 3.13 (L) 3.87 - 5.11 MIL/uL   Hemoglobin 10.3 (L) 12.0 - 15.0 g/dL   HCT 16.1 (L) 09.6 - 04.5 %   MCV  100.0 80.0 - 100.0 fL   MCH 32.9 26.0 - 34.0 pg   MCHC 32.9 30.0 - 36.0 g/dL   RDW 40.9 (H) 81.1 - 91.4 %   Platelets 258 150 - 400 K/uL   nRBC 0.0 0.0 - 0.2 %  Comprehensive metabolic panel  with GFR   Collection Time: 05/26/23 10:47 AM  Result Value Ref Range   Sodium 120 (L) 135 - 145 mmol/L   Potassium 3.3 (L) 3.5 - 5.1 mmol/L   Chloride 70 (L) 98 - 111 mmol/L   CO2 38 (H) 22 - 32 mmol/L   Glucose, Bld 127 (H) 70 - 99 mg/dL   BUN 45 (H) 8 - 23 mg/dL   Creatinine, Ser 8.65 0.44 - 1.00 mg/dL   Calcium 8.4 (L) 8.9 - 10.3 mg/dL   Total Protein 6.7 6.5 - 8.1 g/dL   Albumin 2.9 (L) 3.5 - 5.0 g/dL   AST 17 15 - 41 U/L   ALT 12 0 - 44 U/L   Alkaline Phosphatase 163 (H) 38 - 126 U/L   Total Bilirubin 1.6 (H) 0.0 - 1.2 mg/dL   GFR, Estimated >78 >46 mL/min   Anion gap 11 5 - 15   IR PATIENT EVAL TECH 0-60 MINS Result Date: 05/18/2023 Adam Holm     05/18/2023  5:05 PM Allred, Boyd Cabal, PA-C Physician Assistant Radiology  Progress Notes   Signed  Date of Service: 05/18/2023  4:30 PM  Signed   Patient ID: JANUS RAAD, female   DOB: 1961/01/01, 63 y.o.   MRN: [id number] Pt seen in IR dept today for evaluation of some leaking around biliary drain and need for dressing change. Pt has both GB and biliary drains in place.  The internal/external drain was placed on 05/07/2023.  On exam both drains are intact, sutures intact.  There is a small amount of drainage noted from the internal/external biliary drain insertion site.  Both drains were flushed without difficulty .  Significant reflux from insertion sites were not noted with biliary drain or GB drain flushes.  New gauze dressings were applied over both drains.  Patient would benefit from home health assistance with drain maintenance.  She is currently flushing both drains once daily.  Patient has appointment with Dr. Maryalice Smaller tomorrow and follow-up cholangiogram/ drain exchange scheduled with IR on 05/28/2022.       IR INT EXT BILIARY DRAIN WITH  CHOLANGIOGRAM Result Date: 05/07/2023 INDICATION: Pancreatic carcinoma and progressive biliary obstruction. EXAM: INTERNAL/EXTERNAL BILIARY DRAINAGE CATHETER PLACEMENT WITH CHOLANGIOGRAM MEDICATIONS: 2 g IV cefoxitin ; The antibiotic was administered within an appropriate time frame prior to the initiation of the procedure. ANESTHESIA/SEDATION: Moderate (conscious) sedation was employed during this procedure. A total of Versed  2.0 mg and Fentanyl  75 mcg was administered intravenously by the radiology nurse. Total intra-service moderate Sedation Time: 31 minutes. The patient's level of consciousness and vital signs were monitored continuously by radiology nursing throughout the procedure under my direct supervision. FLUOROSCOPY: Radiation Exposure Index (as provided by the fluoroscopic device): 2.0 mGy Kerma CONTRAST:  20 mL Omnipaque  300 COMPLICATIONS: None immediate. PROCEDURE: Informed written consent was obtained from the patient after a thorough discussion of the procedural risks, benefits and alternatives. All questions were addressed. Maximal Sterile Barrier Technique was utilized including caps, mask, sterile gowns, sterile gloves, sterile drape, hand hygiene and skin antiseptic. Local anesthesia was provided with 1% lidocaine . The abdominal wall was prepped with chlorhexidine . A timeout was performed prior to the initiation of the procedure. Ultrasound was used to localize the liver and bile ducts. Under direct ultrasound guidance, an inferior right lobe peripheral intrahepatic bile duct was punctured with a 21 gauge needle. After stab wishing needle access, a cholangiogram was performed with injection of contrast material via the needle. Fluoroscopic image was saved. A  guidewire was advanced via the needle into central bile ducts. A transitional dilator was advanced over the wire. The dilator was removed over a guidewire. A 5 French Kumpe catheter was advanced into the common bile duct. Additional contrast  injection was performed. The catheter was further advanced over a hydrophilic guidewire through the common bile duct and into the duodenal lumen. The percutaneous tract was dilated to 10 [place]. A 10 French internal/external biliary drainage catheter was then advanced over the wire. The distal portion was formed in the duodenum. The biliary drain was injected with contrast and fluoroscopic image saved to confirm drain position. The drain was then secured at the skin with a Prolene retention suture and StatLock device. It was attached to gravity bag drainage. FINDINGS: Cholangiogram confirms a high-grade biliary obstruction with massively dilated intrahepatic bile ducts and common bile duct. After right lobe peripheral biliary access, a 5 French catheter met relative obstruction at the level of the mid common bile duct just below the cystic duct orifice. 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 An internal/external biliary drainage catheter was able to be placed which is formed in the duodenum and with sideholes extending into the right intrahepatic ducts allowing both internal and external biliary drainage. IMPRESSION: High-grade biliary obstruction involving the mid to distal common bile duct. The obstruction was able to be crossed after right lobe peripheral biliary access allowing placement of a 10 French internal/external biliary drainage catheter which is formed in the duodenum and with sideholes extending into right-sided intrahepatic bile ducts. This will be left currently to gravity bag drainage to maximize biliary decompression. Electronically Signed   By: Erica Hau M.D.   On: 05/07/2023 17:22   CT CHEST ABDOMEN PELVIS W CONTRAST Result Date: 05/04/2023 CLINICAL DATA:  Pancreatic cancer. Chemotherapy and radiation therapy ongoing. Duodenal stent. Biliary drain. * Tracking Code: BO *  EXAM: CT CHEST, ABDOMEN, AND PELVIS WITH CONTRAST TECHNIQUE: Multidetector CT imaging of the chest, abdomen and pelvis was performed following the standard protocol during bolus administration of intravenous contrast. RADIATION DOSE REDUCTION: This exam was performed according to the departmental dose-optimization program which includes automated exposure control, adjustment of the mA and/or kV according to patient size and/or use of iterative reconstruction technique. CONTRAST:  75 mL Omnipaque  COMPARISON:  Outside CT 03/16/2023 FINDINGS: CT CHEST FINDINGS Cardiovascular: Port in the anterior chest wall with tip in distal SVC. No significant vascular findings. Normal heart size. No pericardial effusion. Mediastinum/Nodes: No axillary or supraclavicular adenopathy. No mediastinal or hilar adenopathy. No pericardial fluid. Esophagus normal. Lungs/Pleura: Irregular nodule in the LEFT upper lobe measures 7 mm (image 31/3) compared to 6 mm on CT 12/17/2022. The nodule appears slightly more thickened. No new pulmonary nodules. No pleural fluid. Musculoskeletal: No aggressive osseous lesion. CT ABDOMEN AND PELVIS FINDINGS Hepatobiliary: There is moderate intra and extra extrahepatic biliary duct dilatation which is similar to CT 03/16/2023. Common hepatic duct measures 15 mm (62/2) compared to 14 mm. Intrahepatic duct on the LEFT measures 11 mm (image 54/2) compared to 11 mm. Common bile duct measures 9 mm similar prior. Biliary drain is position more superficial than comparison exam. The pigtail is coiled along the capsule of the RIGHT hepatic lobe where previously coil was positioned more centrally in the gallbladder fossa. Pancreas: Fluid collection in the body of the pancreas measures 4.63.6 cm decreased from 5.6 x 4.4 cm. Pancreatic mass is  difficult to define. Spleen: Normal spleen Adrenals/urinary tract: Adrenal glands and kidneys are normal. The ureters and bladder normal. Stomach/Bowel: Stomach contains oral  contrast. Duodenal stent is noted. Contrast flows through the stent into the small bowel. No obstruction. Colon is normal. Vascular/Lymphatic: Abdominal aorta is normal caliber with atherosclerotic calcification. There is no retroperitoneal or periportal lymphadenopathy. No pelvic lymphadenopathy. Reproductive: Fibroid uterus. Other: No intraperitoneal free fluid.  No peritoneal nodularity. Musculoskeletal: No aggressive osseous lesion. IMPRESSION: 1. Moderate intra and extrahepatic biliary duct dilatation similar to comparison CT 03/16/2023. 2. Percutaneous biliary drain is position more superficial than comparison exam along the capsule the liver. 3. No ascites. 4. Duodenal stent appears patent and unchanged. 5. Fluid collection along the body the pancreas is decreased in volume. Pancreatic mass poorly defined. 6. No peritoneal metastasis identified. 7. Irregular nodule in the LEFT upper lobe is stable. Recommend attention on follow-up. Electronically Signed   By: Deboraha Fallow M.D.   On: 05/04/2023 11:58   IR CHOLANGIOGRAM EXISTING TUBE Result Date: 04/29/2023 CLINICAL DATA:  increasing LFT's Receipt, 63 year old female with a history of pancreatic adenocarcinoma with biliary obstruction. Percutaneous cholecystostomy placed 12/13/2022, most recently exchanged 04/16/2023. Recent tug, with concern for catheter dislodgement. EXAM: CHOLANGIOGRAM VIA EXISTING CATHETER COMPARISON:  IR fluoroscopy, 04/16/2023.  CT AP, 03/16/2023. CONTRAST:  10 mL Omnipaque  300-administered via the existing percutaneous drain. FLUOROSCOPY TIME:  Fluoroscopic dose; 1 mGy TECHNIQUE: The patient was positioned supine on the fluoroscopy table. A preprocedural spot fluoroscopic image was obtained of the RIGHT upper quadrant and the existing percutaneous drainage catheter. Multiple spot fluoroscopic and radiographic images were obtained following the injection of a small amount of contrast via the existing percutaneous drainage catheter.  FINDINGS: Stable positioning of RIGHT upper quadrant percutaneous cholecystostomy tube. Contrast injection demonstrated a normal functioning with opacification of the decompressed gallbladder. IMPRESSION: Stable positioning of well-functioning percutaneous cholecystostomy tube. RECOMMENDATIONS: The patient will return to Vascular Interventional Radiology (VIR) for routine drainage catheter evaluation and exchange scheduled for 05/28/2023. Art Largo, MD [facility] Radiology Electronically Signed   By: Art Largo M.D.   On: 04/29/2023 17:18     EKG None  Radiology No results found.  Procedures Procedures    Medications Ordered in ED Medications  sodium chloride  0.9 % bolus 1,000 mL (has no administration in time range)  potassium chloride  (KLOR-CON ) packet 40 mEq (has no administration in time range)  lactated ringers  bolus 1,000 mL (1,000 mLs Intravenous New Bag/Given 05/26/23 1125)    ED Course/ Medical Decision Making/ A&P                                 Medical Decision Making Problems Addressed: Dehydration: acute illness or injury with systemic symptoms that poses a threat to life or bodily functions Hypokalemia: acute illness or injury Hyponatremia: acute illness or injury with systemic symptoms that poses a threat to life or bodily functions Hypotension due to hypovolemia: acute illness or injury with systemic symptoms that poses a threat to life or bodily functions Malignant neoplasm of pancreas, unspecified location of malignancy [facility]): acute illness or injury with systemic symptoms that poses a threat to life or bodily functions    Details: Acute/chronic  Amount and/or Complexity of Data Reviewed External Data Reviewed: labs, radiology and notes. Labs: ordered. Decision-making details documented in ED Course. Radiology: independent interpretation performed. Decision-making details documented in ED Course. ECG/medicine  tests: ordered  and independent interpretation performed. Decision-making details documented in ED Course. Discussion of management or test interpretation with external provider(s): Medicine.   Risk Prescription drug management. Decision regarding hospitalization.   Iv ns. Continuous pulse ox and cardiac monitoring. Labs ordered/sent.   Differential diagnosis includes  . Dispo decision including potential need for admission considered - will get labs and reassess.   Reviewed nursing notes and prior charts for additional history. External reports reviewed.  Several recent office visits reviewed - blood pressure in 90s then.   LR bolus.   Cardiac monitor: sinus rhythm, rate 94.  Labs reviewed/interpreted by me - wbc chr elevated. Hct c/w baseline. K mildly low. Kcl po. Na very low - NS bolus.   Recent CT reviewed/interpreted by me - no sbo then.   Hospitalists consulted for admission.  Bp improved w ivf.   CRITICAL CARE RE: hypotension, dehydration, hyponatremia, pancreatic cancer Performed by: Nuri Larmer E Roby Spalla Total critical care time: 45 minutes Critical care time was exclusive of separately billable procedures and treating other patients. Critical care was necessary to treat or prevent imminent or life-threatening deterioration. Critical care was time spent personally by me on the following activities: development of treatment plan with patient and/or surrogate as well as nursing, discussions with consultants, evaluation of patient's response to treatment, examination of patient, obtaining history from patient or surrogate, ordering and performing treatments and interventions, ordering and review of laboratory studies, ordering and review of radiographic studies, pulse oximetry and re-evaluation of patient's condition.  It appears patient may benefit from palliative medicine consult during admission.           Final Clinical Impression(s) / ED Diagnoses Final diagnoses:   Hypotension due to hypovolemia  Dehydration  Hyponatremia  Malignant neoplasm of pancreas, unspecified location of malignancy (HCC)  Hypokalemia    Rx / DC Orders ED Discharge Orders     None         Guadalupe Lee, MD 05/26/23 1229

## 2023-05-26 NOTE — ED Notes (Signed)
 When this nurse asked the pt if they could get anything else for them this pt said, "A gun." I asked again because I wasn't sure what I had heard and she said again, "A gun." "I am tired of living like this and I want it to end." "It just keeps getting better and better and I am tired."

## 2023-05-26 NOTE — Progress Notes (Signed)
 Planned to see patient for nutrition follow-up after radiation. Patient did not show for appointment. Chart reviewed. Patient directed to ED per Dr. Maryalice Smaller after radiation. Will reschedule as able.

## 2023-05-26 NOTE — ED Notes (Signed)
 Was not able to obtain urine it was contaminated

## 2023-05-26 NOTE — ED Notes (Signed)
 [facility] about Ensure

## 2023-05-26 NOTE — H&P (Signed)
 History and Physical    Patient: Kathryn Underwood ZOX:[id number] DOB: 15-Jan-1961 DOA: 05/26/2023 DOS: the patient was seen and examined on 05/26/2023 PCP: Allwardt, Deleta Felix, PA-C  Patient coming from: Home  Chief Complaint:  Chief Complaint  Patient presents with   Hypotension   HPI: [name] is a 63 y.o. female with medical history significant of alcohol, cannabis, cocaine and tobacco abuse in remission, history of DTs, history of alcoholic ketoacidosis, history of alcoholic pancreatitis, COPD, depression, anxiety, transaminitis, vitamin D  deficiency, esophageal dysphagia, status post biliary drain placement, pancreatic adenocarcinoma on Xeloda  following with Dr. Maryalice Smaller who was sent from the cancer center due to hypotension and hyponatremia.  No change in pain quality and she occasionally gets nauseous. No emesis, diarrhea, constipation, melena or hematochezia.  No flank pain, dysuria, frequency or hematuria. She denied fever, chills, rhinorrhea, sore throat, wheezing or hemoptysis.  No chest pain, palpitations, diaphoresis, PND, orthopnea or pitting edema of the lower extremities. No polyuria, polydipsia, polyphagia or blurred vision.  She has been drinking at least 2 L of water daily.  She only weights 42.7 kg.  History is taken with the assistance of her husband.  Lab work: Urinalysis was normal.  CBC showed white count 21.6, hemoglobin 10.3 g/dL and platelets 811.  CMP showed a 720, potassium 3.3, chloride 70 and CO2 38 mmol/L with an anion gap of 11.  Glucose 127, BUN 45 and creatinine 0.98 mg/dL.  Calcium is normal after correction.  LFTs showed a normal total protein and transaminases.  Albumin was 2.9 g/dL, alkaline phosphatase 914 units/L and total bilirubin 1.6 mg/dL.  Urine osmolality was down to 297 mOsm/kg and urine sodium was less than 10 mmol/L.  ED course: Initial vital signs were temperature 97.5 F, pulse 95, respiration 16, BP 91/59 mmHg O2 sat 95% on room air.  The patient  received LR 1000 mL bolus and normal saline 1000 mL liter bolus.  The patient asked the nursing staff to provide her a gun because she stated she did not want to live any longer.  Psychiatry consult requested.   Review of Systems: As mentioned in the history of present illness. All other systems reviewed and are negative.  Past Medical History:  Diagnosis Date   Alcohol abuse    Anxiety    Chickenpox    Depression    Neuromuscular disorder (HCC)    neuropathy   Pancreatic cancer (HCC)    Substance abuse [facility])    Past Surgical History:  Procedure Laterality Date   BIOPSY  12/10/2022   Procedure: BIOPSY;  Surgeon: Mansouraty, Albino Alu., MD;  Location: [facility];  Service: Gastroenterology;;   CESAREAN SECTION     DUODENAL STENT PLACEMENT N/A 01/06/2023   Procedure: DUODENAL STENT PLACEMENT;  Surgeon: Normie Becton., MD;  Location: [facility];  Service: Gastroenterology;  Laterality: N/A;   ESOPHAGOGASTRODUODENOSCOPY N/A 08/14/2021   Procedure: ESOPHAGOGASTRODUODENOSCOPY (EGD);  Surgeon: Janel Medford, MD;  Location: [facility];  Service: Gastroenterology;  Laterality: N/A;   ESOPHAGOGASTRODUODENOSCOPY N/A 12/12/2022   Procedure: ESOPHAGOGASTRODUODENOSCOPY (EGD);  Surgeon: Normie Becton., MD;  Location: [facility];  Service: Gastroenterology;  Laterality: N/A;   ESOPHAGOGASTRODUODENOSCOPY (EGD) WITH PROPOFOL  N/A 12/10/2022   Procedure: ESOPHAGOGASTRODUODENOSCOPY (EGD) WITH PROPOFOL ;  Surgeon: Brice Campi Albino Alu., MD;  Location: [facility];  Service: Gastroenterology;  Laterality: N/A;   ESOPHAGOGASTRODUODENOSCOPY (EGD) WITH PROPOFOL  N/A 01/06/2023   Procedure: ESOPHAGOGASTRODUODENOSCOPY (EGD) WITH PROPOFOL ;  Surgeon: Brice Campi Albino Alu., MD;  Location: [facility];  Service:  Gastroenterology;  Laterality: N/A;  with duodenal stent placement   EUS N/A 08/14/2021   Procedure: UPPER ENDOSCOPIC ULTRASOUND (EUS) RADIAL;  Surgeon: Janel Medford, MD;   Location: [facility];  Service: Gastroenterology;  Laterality: N/A;   EUS N/A 12/12/2022   Procedure: UPPER ENDOSCOPIC ULTRASOUND (EUS) LINEAR;  Surgeon: Normie Becton., MD;  Location: [facility];  Service: Gastroenterology;  Laterality: N/A;   EUS N/A 01/06/2023   Procedure: FULL UPPER ENDOSCOPIC ULTRASOUND (EUS) RADIAL;  Surgeon: Brice Campi Albino Alu., MD;  Location: [facility];  Service: Gastroenterology;  Laterality: N/A;   FINE NEEDLE ASPIRATION N/A 08/14/2021   Procedure: FINE NEEDLE ASPIRATION (FNA) LINEAR;  Surgeon: Janel Medford, MD;  Location: [facility];  Service: Gastroenterology;  Laterality: N/A;   FINE NEEDLE ASPIRATION N/A 12/12/2022   Procedure: FINE NEEDLE ASPIRATION (FNA) LINEAR;  Surgeon: Normie Becton., MD;  Location: [facility];  Service: Gastroenterology;  Laterality: N/A;   FINE NEEDLE ASPIRATION  01/06/2023   Procedure: FINE NEEDLE ASPIRATION;  Surgeon: Normie Becton., MD;  Location: [facility];  Service: Gastroenterology;;   IR CHOLANGIOGRAM EXISTING TUBE  04/29/2023   IR EXCHANGE BILIARY DRAIN  01/29/2023   IR EXCHANGE BILIARY DRAIN  03/05/2023   IR EXCHANGE BILIARY DRAIN  04/16/2023   IR IMAGING GUIDED PORT INSERTION  12/18/2022   IR INT EXT BILIARY DRAIN WITH CHOLANGIOGRAM  05/07/2023   IR PATIENT EVAL TECH 0-60 MINS  05/18/2023   IR PERC CHOLECYSTOSTOMY  12/13/2022   TONSILLECTOMY     Social History:  reports that she has been smoking cigarettes. She has never used smokeless tobacco. She reports that she does not currently use alcohol after a past usage of about 16.0 standard drinks of alcohol per week. She reports that she does not currently use drugs after having used the following drugs: Cocaine and Marijuana.  No Known Allergies  Family History  Problem Relation Age of Onset   Dementia Mother    Arthritis Father    Asthma Father    Asthma Brother    Diabetes Maternal Grandmother    Dementia Maternal Grandmother     Diabetes Paternal Grandmother    Parkinson's disease Maternal Grandfather    Parkinson's disease Paternal Grandfather    Breast cancer Neg Hx    Colon cancer Neg Hx     Prior to Admission medications   Medication Sig Start Date End Date Taking? Authorizing Provider  ELIQUIS  5 MG TABS tablet TAKE 1 TABLET BY MOUTH TWICE A DAY 04/09/23  Yes Boscia, Rumaldo Countess, NP  Ensure Max Protein (ENSURE MAX PROTEIN) LIQD Take 330 mLs (11 oz total) by mouth 2 (two) times daily. 12/18/22  Yes Gonfa, Taye T, MD  famotidine  (PEPCID ) 20 MG tablet Take 1 tablet (20 mg total) by mouth 2 (two) times daily. 04/21/23  Yes Pickenpack-Cousar, Giles Labrum, NP  gabapentin  (NEURONTIN ) 300 MG capsule Take 2 capsules (600 mg total) by mouth 2 (two) times daily. Patient taking differently: Take 900 mg by mouth at bedtime. 03/24/23  Yes Pickenpack-Cousar, Giles Labrum, NP  lidocaine -prilocaine  (EMLA ) cream Apply to affected area once 12/28/22  Yes Sonja Miltonsburg, MD  mirtazapine  (REMERON ) 45 MG tablet Take 1 tablet (45 mg total) by mouth at bedtime. 03/24/23  Yes Pickenpack-Cousar, Giles Labrum, NP  morphine  (MS CONTIN ) 30 MG 12 hr tablet Take 1 tablet (30 mg total) by mouth every 8 (eight) hours. 05/19/23  Yes Pickenpack-Cousar, Athena N, NP  Multiple Vitamin (MULTIVITAMIN ADULT PO) Take 1 tablet by  mouth daily.   Yes [provider]  ondansetron  (ZOFRAN ) 8 MG tablet Take 1 tablet (8 mg total) by mouth every 8 (eight) hours as needed for nausea or vomiting. 12/28/22  Yes Sonja Tioga, MD  Oxycodone  HCl 20 MG TABS Take 1 tablet (20 mg total) by mouth every 3 (three) hours as needed. Patient taking differently: Take 20 mg by mouth every 3 (three) hours. 05/19/23  Yes Pickenpack-Cousar, Giles Labrum, NP  prochlorperazine  (COMPAZINE ) 10 MG tablet Take 1 tablet (10 mg total) by mouth every 6 (six) hours as needed for nausea or vomiting. 12/28/22  Yes Sonja Ridgely, MD  QUEtiapine  (SEROQUEL ) 100 MG tablet Take 2 tablets (200 mg total) by mouth at bedtime.  03/24/23  Yes Pickenpack-Cousar, Giles Labrum, NP  Vitamin D , Ergocalciferol , (DRISDOL ) 1.25 MG (50000 UNIT) CAPS capsule Take 1 capsule (50,000 Units total) by mouth every 7 (seven) days. Patient taking differently: Take 50,000 Units by mouth every 7 (seven) days. Sundays 01/02/23  Yes Barbee Lew, MD  furosemide  (LASIX ) 20 MG tablet TAKE 1 TABLET (20 MG TOTAL) BY MOUTH DAILY. FOR 5 DAYS THEN AS NEEDED FOR LEG EDEMA Patient not taking: Reported on 05/19/2023 04/19/23   Sonja Candelaria, MD  potassium chloride  (KLOR-CON  M) 10 MEQ tablet TAKE 1 TABLET (10 MEQ TOTAL) BY MOUTH DAILY. WHEN YOU TAKE LASIX  Patient not taking: Reported on 05/26/2023 04/19/23   Sonja Wishek, MD  sodium chloride  flush (NS) 0.9 % SOLN Use 5 - 10 mLs to flush abdominal drain once daily as directed 12/18/22   Allred, Boyd Cabal, PA-C    Physical Exam: Vitals:   05/26/23 1045 05/26/23 1100 05/26/23 1130 05/26/23 1200  BP: 95/61 91/79 (!) 89/62 97/75  Pulse: 88 90 89 88  Resp:   18   Temp:      TempSrc:      SpO2: 93% 93% 94% 95%  Weight:      Height:       Physical Exam Vitals and nursing note reviewed.  Constitutional:      General: She is awake.     Appearance: Normal appearance. She is underweight. She is ill-appearing.  HENT:     Head: Normocephalic.     Nose: No rhinorrhea.     Mouth/Throat:     Mouth: Mucous membranes are dry.  Eyes:     General: No scleral icterus.    Pupils: Pupils are equal, round, and reactive to light.  Neck:     Vascular: No JVD.  Cardiovascular:     Rate and Rhythm: Normal rate and regular rhythm.     Heart sounds: S1 normal and S2 normal.  Pulmonary:     Effort: No tachypnea.     Breath sounds: No wheezing, rhonchi or rales.  Abdominal:     General: Bowel sounds are normal. There is no distension.     Palpations: Abdomen is soft.     Tenderness: There is abdominal tenderness in the epigastric area. There is no right CVA tenderness or left CVA tenderness.     Comments: Biliary  drain in place.  Musculoskeletal:     Cervical back: Neck supple.     Right lower leg: No edema.     Left lower leg: No edema.  Skin:    General: Skin is warm and dry.  Neurological:     General: No focal deficit present.     Mental Status: She is alert and oriented to person, place, and time.  Psychiatric:  Mood and Affect: Mood is anxious.        Speech: Speech is delayed.        Behavior: Behavior normal. Behavior is cooperative.     Data Reviewed:  Results are pending, will review when available. 08/05/2021 echocardiogram complete. IMPRESSIONS:   1. Left ventricular ejection fraction, by estimation, is 50 to 55%. The  left ventricle has low normal function. The left ventricle has no regional  wall motion abnormalities. Left ventricular diastolic parameters are  consistent with Grade I diastolic  dysfunction (impaired relaxation).   2. Right ventricular systolic function is normal. The right ventricular  size is normal.   3. The mitral valve is normal in structure. No evidence of mitral valve  regurgitation.   4. The aortic valve is tricuspid. Aortic valve regurgitation is not  visualized. No aortic stenosis is present.   5. The inferior vena cava is normal in size with greater than 50%  respiratory variability, suggesting right atrial pressure of 3 mmHg.   EKG: Vent. rate 97 BPM PR interval 190 ms QRS duration 114 ms QT/QTcB 332/413 ms P-R-T axes 83 81 5 Sinus rhythm Ventricular bigeminy Anterior infarct, old Borderline ST elevation, lateral leads Baseline wander in lead(s) V3  Assessment and Plan: Principal Problem:   Arterial hypotension Associated with:   Hyponatremia And:   Protein-calorie malnutrition, severe In the setting of:   Pancreatic adenocarcinoma (HCC) Observation/PCU. Free water fluid restriction. Continue normal saline infusion. Follow-up sodium level. Continue MS Contin  twice daily. Continue oxycodone  20 mg every 3 hours as  needed. Follow-up at the cancer center as scheduled with Dr. Maryalice Smaller.   Active Problems:   Hypokalemia Replacing. Follow potassium level.    Hypercarbia Likely contraction alkalosis. Continue IV fluids. Follow-up CO2 level in AM.    Hyperbilirubinemia Continues to improve after drain placement..    Normocytic anemia Monitor hematocrit and hemoglobin.    Thrombosis of splenic artery (HCC) Continue apixaban  5 mg p.o. twice daily.    COPD (chronic obstructive pulmonary disease) (HCC) Bronchodilators as needed.    Depression with anxiety Continue mirtazapine  45 mg p.o. bedtime. Continue quetiapine  200 mg p.o. at bedtime.    Leukocytosis In the setting of cancer. This is chronic. No fever or signs of infection.    Grade I diastolic dysfunction The patient is currently volume depleted.      Advance Care Planning:   Code Status: Limited: Do not attempt resuscitation (DNR) -DNR-LIMITED -Do Not Intubate/DNI    Consults:   Family Communication:   Severity of Illness: The appropriate patient status for this patient is OBSERVATION. Observation status is judged to be reasonable and necessary in order to provide the required intensity of service to ensure the patient's safety. The patient's presenting symptoms, physical exam findings, and initial radiographic and laboratory data in the context of their medical condition is felt to place them at decreased risk for further clinical deterioration. Furthermore, it is anticipated that the patient will be medically stable for discharge from the hospital within 2 midnights of admission.   Author: Danice Dural, MD 05/26/2023 12:52 PM  For on call review [email or web address].   This document was prepared using Dragon voice recognition software and may contain some unintended transcription errors.

## 2023-05-26 NOTE — Progress Notes (Signed)
 Patient her for port flush/labs.  Patient had orders for a blood culture.  Appointment made for lab, in order to obtain the blood culture.  No port flush done today.  Patient and family made aware

## 2023-05-26 NOTE — Progress Notes (Signed)
 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 -She was readmitted to hospital for recurrent nausea and vomiting, likely partial gastric outlet obstruction from pancreatic tumor.  She is able to tolerate full liquid diet, she underwet duodenal stent placement on 01/06/2023 -she started chemo gemcitabine  and abraxane  on 01/26/2023  -her scan was reviewed by Dr. Leighton Punches and her cancer was deemed to be unresectable, this is confirmed by Dr. Zani at [facility]  -She started concurrent chemoradiation with Xeloda  on April 26, 2023. Xeloda  was stopped after 3-4 days due to jaundice  -she developed jaundice on 04/28/2023, underwent PTC on 05/07/2023  Assessment & Plan Pancreatic cancer Pancreatic cancer with recent missed radiation sessions due to hospitalization and feeling unwell. Concerns about current health status, including hypotension and potential dehydration or infection. Symptoms include low-grade fever and weakness. - Coordinate with radiation oncology to determine if radiation can proceed today - Send to emergency room for further evaluation - Order imaging and cultures in the emergency room - Administer IV fluids in  the emergency room  Hypotension Hypotension with blood pressure recorded at 86/40. Possible causes include dehydration or infection. Symptoms include weakness and dizziness upon standing. Blood pressure monitoring at home is not currently being done.  Weight loss Weight loss with stable weight from last week but decreased from two weeks ago. Dietary intake includes peas, carrots, and chicken casserole. Challenges with eating due to GERD and hiccups.  Gastroesophageal reflux disease (GERD) GERD symptoms managed by dietary adjustments and sleeping in a reclined position. Symptoms include reflux and hiccups, potentially related to body position.  Hiccups Frequent hiccups, possibly related to body position and GERD.  Plan - Due to her hypotension, worsening leukocytosis, and overall not feeling well, I recommend ED evaluation -if she is admitted, I will f/u in hospital    SUMMARY OF ONCOLOGIC HISTORY: Oncology History  Pancreatic adenocarcinoma (HCC)  12/12/2022 Cancer Staging   Staging form: Exocrine Pancreas, AJCC 8th Edition - Clinical stage from 12/12/2022: Stage IIA (cT3, cN0, cM0) - Signed by Sonja Cruger, MD on 12/28/2022 Total positive nodes: 0   12/16/2022 Initial Diagnosis   Pancreatic adenocarcinoma (HCC)   01/26/2023 -  Chemotherapy   Patient is on Treatment Plan : PANCREATIC Abraxane  D1,8 + Gemcitabine  D1,8 q21d      Genetic Testing   Negative 76 gene [facility] CancerNext-Expanded +RNAinsight panel. The CancerNext-Expanded gene panel offered by [facility] and includes sequencing, rearrangement, and RNA analysis for the following 76 genes: AIP, ALK, APC, ATM, AXIN2, BAP1, BARD1, BMPR1A, BRCA1, BRCA2, BRIP1, CDC73, CDH1, CDK4, CDKN1B, CDKN2A, CEBPA, CHEK2, CTNNA1, DDX41, DICER1, ETV6, FH, FLCN, GATA2, LZTR1, MAX, MBD4, MEN1, MET, MLH1, MSH2, MSH3, MSH6, MUTYH, NF1, NF2, NTHL1, PALB2, PHOX2B, PMS2, POT1, PRKAR1A, PTCH1, PTEN, RAD51C, RAD51D, RB1, RET, RUNX1, SDHA, SDHAF2, SDHB,  SDHC, SDHD, SMAD4,  SMARCA4, SMARCB1, SMARCE1, STK11, SUFU, TMEM127, TP53, TSC1, TSC2, VHL, and WT1 (sequencing and deletion/duplication); EGFR, HOXB13, KIT, MITF, PDGFRA, POLD1, and POLE (sequencing only); EPCAM and GREM1 (deletion/duplication only). Report date 03/19/23.    03/16/2023 Imaging   CT CAP with pancreatic protocol and CTA imaging - done [facility] Impression:  1. Local advanced infiltrative mass centered within the pancreatic head with extensive vascular involvement, as above.   2. A 7 mm left upper lobe pulmonary nodule is indeterminate.  3. Moderate biliary dilation with obstruction of the level of the extra hepatic bile ducts and possibly owing to obstruction from the pancreatic head mass and/or inflammatory stricture.   4. Multiple pancreatic collections, largest anterior to the pancreatic body, possibly related to prior pancreatitis.  5. Ovoid nodule within the superior right breast is indeterminant. Consider further evaluation with dedicated mammogram as clinically appropriate for this patient.        Discussed the use of AI scribe software for clinical note transcription with the patient, who gave verbal consent to proceed.  History of Present Illness Kathryn Underwood is a 63 year old female with pancreatic cancer who presents for follow-up.  She has missed two radiation therapy sessions, one due to hospitalization and another due to feeling unwell. Her weight is stable from last week but has decreased compared to two weeks ago. Her diet includes peas, carrots, and chicken casserole, and she consumes three to four Ensures daily. She manages reflux by eating the right foods at the right time. She drains about 15 ounces daily, with fluctuations. She experienced a temperature of 44F the night before last. She feels weak and occasionally dizzy upon standing, requiring assistance to stabilize.     All other systems were reviewed with the patient and are  negative.  MEDICAL HISTORY:  Past Medical History:  Diagnosis Date   Alcohol abuse    Anxiety    Chickenpox    Depression    Neuromuscular disorder (HCC)    neuropathy   Pancreatic cancer (HCC)    Substance abuse (HCC)     SURGICAL HISTORY: Past Surgical History:  Procedure Laterality Date   BIOPSY  12/10/2022   Procedure: BIOPSY;  Surgeon: Mansouraty, Albino Alu., MD;  Location: [facility];  Service: Gastroenterology;;   CESAREAN SECTION     DUODENAL STENT PLACEMENT N/A 01/06/2023   Procedure: DUODENAL STENT PLACEMENT;  Surgeon: Normie Becton., MD;  Location: [facility];  Service: Gastroenterology;  Laterality: N/A;   ESOPHAGOGASTRODUODENOSCOPY N/A 08/14/2021   Procedure: ESOPHAGOGASTRODUODENOSCOPY (EGD);  Surgeon: Janel Medford, MD;  Location: [facility];  Service: Gastroenterology;  Laterality: N/A;   ESOPHAGOGASTRODUODENOSCOPY N/A 12/12/2022   Procedure: ESOPHAGOGASTRODUODENOSCOPY (EGD);  Surgeon: Normie Becton., MD;  Location: [facility];  Service: Gastroenterology;  Laterality: N/A;   ESOPHAGOGASTRODUODENOSCOPY (EGD) WITH PROPOFOL  N/A 12/10/2022   Procedure: ESOPHAGOGASTRODUODENOSCOPY (EGD) WITH PROPOFOL ;  Surgeon: Brice Campi Albino Alu., MD;  Location: [facility];  Service: Gastroenterology;  Laterality: N/A;   ESOPHAGOGASTRODUODENOSCOPY (EGD) WITH PROPOFOL  N/A 01/06/2023   Procedure: ESOPHAGOGASTRODUODENOSCOPY (EGD) WITH PROPOFOL ;  Surgeon: Brice Campi Albino Alu., MD;  Location: [facility];  Service: Gastroenterology;  Laterality: N/A;  with duodenal stent placement   EUS N/A 08/14/2021   Procedure: UPPER ENDOSCOPIC ULTRASOUND (EUS) RADIAL;  Surgeon: Janel Medford, MD;  Location: [facility];  Service: Gastroenterology;  Laterality: N/A;   EUS N/A 12/12/2022   Procedure: UPPER ENDOSCOPIC ULTRASOUND (EUS) LINEAR;  Surgeon: Normie Becton., MD;  Location: [facility];  Service: Gastroenterology;  Laterality: N/A;   EUS N/A  01/06/2023   Procedure: FULL UPPER ENDOSCOPIC ULTRASOUND (EUS) RADIAL;  Surgeon: Brice Campi Albino Alu., MD;  Location: [facility];  Service: Gastroenterology;  Laterality: N/A;   FINE NEEDLE ASPIRATION N/A 08/14/2021   Procedure: FINE NEEDLE ASPIRATION (FNA) LINEAR;  Surgeon: Janel Medford, MD;  Location: [facility];  Service: Gastroenterology;  Laterality: N/A;   FINE NEEDLE ASPIRATION N/A 12/12/2022   Procedure: FINE NEEDLE ASPIRATION (FNA) LINEAR;  Surgeon: Normie Becton., MD;  Location: [facility];  Service: Gastroenterology;  Laterality: N/A;   FINE NEEDLE ASPIRATION  01/06/2023   Procedure: FINE NEEDLE ASPIRATION;  Surgeon: Normie Becton., MD;  Location: [facility];  Service: Gastroenterology;;   IR CHOLANGIOGRAM EXISTING TUBE  04/29/2023   IR EXCHANGE BILIARY DRAIN  01/29/2023   IR EXCHANGE BILIARY DRAIN  03/05/2023   IR EXCHANGE BILIARY DRAIN  04/16/2023   IR IMAGING GUIDED PORT INSERTION  12/18/2022   IR INT EXT BILIARY DRAIN WITH CHOLANGIOGRAM  05/07/2023   IR PATIENT EVAL TECH 0-60 MINS  05/18/2023   IR PERC CHOLECYSTOSTOMY  12/13/2022   TONSILLECTOMY      I have reviewed the social history and family history with the patient and they are unchanged from previous note.  ALLERGIES:  has no known allergies.  MEDICATIONS:  Current Outpatient Medications  Medication Sig Dispense Refill   ELIQUIS  5 MG TABS tablet TAKE 1 TABLET BY MOUTH TWICE A DAY 60 tablet 1   Ensure Max Protein (ENSURE MAX PROTEIN) LIQD Take 330 mLs (11 oz total) by mouth 2 (two) times daily.     famotidine  (PEPCID ) 20 MG tablet Take 1 tablet (20 mg total) by mouth 2 (two) times daily. 60 tablet 3   furosemide  (LASIX ) 20 MG tablet TAKE 1 TABLET (20 MG TOTAL) BY MOUTH DAILY. FOR 5 DAYS THEN AS NEEDED FOR LEG EDEMA (Patient not taking: Reported on 05/19/2023) 20 tablet 0   gabapentin  (NEURONTIN ) 300 MG capsule Take 2 capsules (600 mg total) by mouth 2 (two) times daily. 120 capsule 3    lidocaine -prilocaine  (EMLA ) cream Apply to affected area once 30 g 3   mirtazapine  (REMERON ) 45 MG tablet Take 1 tablet (45 mg total) by mouth at bedtime. 30 tablet 3   morphine  (MS CONTIN ) 30 MG 12 hr tablet Take 1 tablet (30 mg total) by mouth every 8 (eight) hours. 90 tablet 0   Multiple Vitamin (MULTIVITAMIN ADULT PO) Take 1 tablet by mouth daily.     ondansetron  (ZOFRAN ) 8 MG tablet Take 1 tablet (8 mg total) by mouth every 8 (eight) hours as needed for nausea or vomiting. 30 tablet 1   Oxycodone  HCl 20 MG TABS Take 1 tablet (20 mg total) by mouth every 3 (three) hours as needed. 90 tablet 0   potassium chloride  (KLOR-CON  M) 10 MEQ tablet TAKE 1 TABLET (10 MEQ TOTAL) BY MOUTH DAILY. WHEN YOU TAKE LASIX  20 tablet 0   prochlorperazine  (COMPAZINE ) 10 MG tablet Take 1 tablet (10 mg total) by mouth every 6 (six) hours as needed for nausea or vomiting. 30 tablet 1   QUEtiapine  (SEROQUEL ) 100 MG tablet Take 2 tablets (200 mg total) by mouth at bedtime. 30 tablet 3   sodium chloride  flush (NS) 0.9 % SOLN Use 5 - 10 mLs to flush abdominal drain once daily as directed 300 mL 3   Vitamin D , Ergocalciferol , (DRISDOL ) 1.25 MG (50000 UNIT) CAPS capsule Take 1 capsule (50,000 Units total) by mouth every 7 (  seven) days. (Patient taking differently: Take 50,000 Units by mouth every 7 (seven) days. Sundays) 5 capsule 6   No current facility-administered medications for this visit.    PHYSICAL EXAMINATION: ECOG PERFORMANCE STATUS: 2 - Symptomatic, <50% confined to bed  Vitals:   05/26/23 0920  BP: (!) 86/54  Pulse: 60  Resp: 16  Temp: 98.3 F (36.8 C)  SpO2: 91%   Wt Readings from Last 3 Encounters:  05/26/23 94 lb 2 oz (42.7 kg)  05/19/23 94 lb 4.8 oz (42.8 kg)  05/12/23 102 lb 6.4 oz (46.4 kg)     GENERAL:alert, no distress and comfortable SKIN: skin color, texture, turgor are normal, no rashes or significant lesions EYES: normal, Conjunctiva are pink and non-injected, sclera clear NECK:  supple, thyroid  normal size, non-tender, without nodularity LYMPH:  no palpable lymphadenopathy in the cervical, axillary  LUNGS: clear to auscultation and percussion with normal breathing effort HEART: regular rate & rhythm and no murmurs and no lower extremity edema ABDOMEN:abdomen soft, non-tender and normal bowel sounds Musculoskeletal:no cyanosis of digits and no clubbing  NEURO: alert & oriented x 3 with fluent speech, no focal motor/sensory deficits  Physical Exam VITALS: BP- 86/40 MEASUREMENTS: Weight- stable from last week.  LABORATORY DATA:  I have reviewed the data as listed    Latest Ref Rng & Units 05/19/2023    8:39 AM 05/12/2023   10:10 AM 05/08/2023    5:58 AM  CBC  WBC 4.0 - 10.5 K/uL 18.3  13.7  13.4   Hemoglobin 12.0 - 15.0 g/dL 16.1  9.9  9.6   Hematocrit 36.0 - 46.0 % 32.2  29.8  28.0   Platelets 150 - 400 K/uL 338  371  337         Latest Ref Rng & Units 05/19/2023    8:39 AM 05/12/2023   10:10 AM 05/09/2023    5:59 AM  CMP  Glucose 70 - 99 mg/dL 096  045  409   BUN 8 - 23 mg/dL 21  23  19    Creatinine 0.44 - 1.00 mg/dL 8.11  9.14  7.82   Sodium 135 - 145 mmol/L 125  131  129   Potassium 3.5 - 5.1 mmol/L 3.9  3.7  3.5   Chloride 98 - 111 mmol/L 86  95  95   CO2 22 - 32 mmol/L 33  30  27   Calcium 8.9 - 10.3 mg/dL 9.9  9.4  8.8   Total Protein 6.5 - 8.1 g/dL 6.7  6.8  6.3   Total Bilirubin 0.0 - 1.2 mg/dL 2.0  3.0  4.1   Alkaline Phos 38 - 126 U/L 263  361  522   AST 15 - 41 U/L 20  14  29    ALT 0 - 44 U/L 16  29  73       RADIOGRAPHIC STUDIES: I have personally reviewed the radiological images as listed and agreed with the findings in the report. No results found.    No orders of the defined types were placed in this encounter.  All questions were answered. The patient knows to call the clinic with any problems, questions or concerns. No barriers to learning was detected. The total time spent in the appointment was 30 minutes.     Sonja [place],  MD 05/26/2023

## 2023-05-27 ENCOUNTER — Ambulatory Visit
Admission: RE | Admit: 2023-05-27 | Discharge: 2023-05-27 | Disposition: A | Discharge status: Home / Self Care | Source: Ambulatory Visit | Attending: Radiation Oncology | Admitting: Radiation Oncology

## 2023-05-27 ENCOUNTER — Other Ambulatory Visit: Payer: Self-pay

## 2023-05-27 ENCOUNTER — Inpatient Hospital Stay: Payer: Self-pay | Admitting: Nurse Practitioner

## 2023-05-27 DIAGNOSIS — Z510 Encounter for antineoplastic radiation therapy: Secondary | ICD-10-CM | POA: Insufficient documentation

## 2023-05-27 DIAGNOSIS — E860 Dehydration: Secondary | ICD-10-CM | POA: Diagnosis present

## 2023-05-27 DIAGNOSIS — D72829 Elevated white blood cell count, unspecified: Secondary | ICD-10-CM | POA: Insufficient documentation

## 2023-05-27 DIAGNOSIS — F17210 Nicotine dependence, cigarettes, uncomplicated: Secondary | ICD-10-CM | POA: Insufficient documentation

## 2023-05-27 DIAGNOSIS — R45851 Suicidal ideations: Secondary | ICD-10-CM | POA: Diagnosis present

## 2023-05-27 DIAGNOSIS — J449 Chronic obstructive pulmonary disease, unspecified: Secondary | ICD-10-CM | POA: Diagnosis present

## 2023-05-27 DIAGNOSIS — E876 Hypokalemia: Secondary | ICD-10-CM

## 2023-05-27 DIAGNOSIS — Z66 Do not resuscitate: Secondary | ICD-10-CM

## 2023-05-27 DIAGNOSIS — Z79899 Other long term (current) drug therapy: Secondary | ICD-10-CM | POA: Diagnosis not present

## 2023-05-27 DIAGNOSIS — D630 Anemia in neoplastic disease: Secondary | ICD-10-CM | POA: Diagnosis present

## 2023-05-27 DIAGNOSIS — Z515 Encounter for palliative care: Secondary | ICD-10-CM | POA: Diagnosis not present

## 2023-05-27 DIAGNOSIS — I748 Embolism and thrombosis of other arteries: Secondary | ICD-10-CM | POA: Diagnosis present

## 2023-05-27 DIAGNOSIS — C259 Malignant neoplasm of pancreas, unspecified: Secondary | ICD-10-CM | POA: Diagnosis present

## 2023-05-27 DIAGNOSIS — R64 Cachexia: Secondary | ICD-10-CM | POA: Diagnosis present

## 2023-05-27 DIAGNOSIS — Z7189 Other specified counseling: Secondary | ICD-10-CM | POA: Diagnosis not present

## 2023-05-27 DIAGNOSIS — I959 Hypotension, unspecified: Secondary | ICD-10-CM | POA: Diagnosis not present

## 2023-05-27 DIAGNOSIS — E871 Hypo-osmolality and hyponatremia: Secondary | ICD-10-CM | POA: Diagnosis present

## 2023-05-27 DIAGNOSIS — F418 Other specified anxiety disorders: Secondary | ICD-10-CM | POA: Diagnosis not present

## 2023-05-27 DIAGNOSIS — E861 Hypovolemia: Secondary | ICD-10-CM | POA: Diagnosis present

## 2023-05-27 DIAGNOSIS — Z7901 Long term (current) use of anticoagulants: Secondary | ICD-10-CM | POA: Diagnosis not present

## 2023-05-27 DIAGNOSIS — Z681 Body mass index (BMI) 19 or less, adult: Secondary | ICD-10-CM | POA: Diagnosis not present

## 2023-05-27 DIAGNOSIS — F1411 Cocaine abuse, in remission: Secondary | ICD-10-CM | POA: Diagnosis present

## 2023-05-27 DIAGNOSIS — F419 Anxiety disorder, unspecified: Secondary | ICD-10-CM | POA: Diagnosis present

## 2023-05-27 DIAGNOSIS — R627 Adult failure to thrive: Secondary | ICD-10-CM | POA: Diagnosis present

## 2023-05-27 DIAGNOSIS — C258 Malignant neoplasm of overlapping sites of pancreas: Secondary | ICD-10-CM | POA: Insufficient documentation

## 2023-05-27 DIAGNOSIS — F332 Major depressive disorder, recurrent severe without psychotic features: Secondary | ICD-10-CM | POA: Diagnosis present

## 2023-05-27 DIAGNOSIS — E43 Unspecified severe protein-calorie malnutrition: Secondary | ICD-10-CM | POA: Diagnosis present

## 2023-05-27 DIAGNOSIS — F1120 Opioid dependence, uncomplicated: Secondary | ICD-10-CM | POA: Diagnosis present

## 2023-05-27 DIAGNOSIS — I9589 Other hypotension: Secondary | ICD-10-CM | POA: Diagnosis present

## 2023-05-27 LAB — CBC
HCT: 27 % — ABNORMAL LOW (ref 36.0–46.0)
Hemoglobin: 8.6 g/dL — ABNORMAL LOW (ref 12.0–15.0)
MCH: 33 pg (ref 26.0–34.0)
MCHC: 31.9 g/dL (ref 30.0–36.0)
MCV: 103.4 fL — ABNORMAL HIGH (ref 80.0–100.0)
Platelets: 213 10*3/uL (ref 150–400)
RBC: 2.61 MIL/uL — ABNORMAL LOW (ref 3.87–5.11)
RDW: 16.2 % — ABNORMAL HIGH (ref 11.5–15.5)
WBC: 18 10*3/uL — ABNORMAL HIGH (ref 4.0–10.5)
nRBC: 0 % (ref 0.0–0.2)

## 2023-05-27 LAB — RAD ONC ARIA SESSION SUMMARY
Course Elapsed Days: 31
Plan Fractions Treated to Date: 22
Plan Prescribed Dose Per Fraction: 1.8 Gy
Plan Total Fractions Prescribed: 25
Plan Total Prescribed Dose: 45 Gy
Reference Point Dosage Given to Date: 39.6 Gy
Reference Point Session Dosage Given: 1.8 Gy
Session Number: 22

## 2023-05-27 LAB — COMPREHENSIVE METABOLIC PANEL WITH GFR
ALT: 10 U/L (ref 0–44)
AST: 14 U/L — ABNORMAL LOW (ref 15–41)
Albumin: 2.3 g/dL — ABNORMAL LOW (ref 3.5–5.0)
Alkaline Phosphatase: 127 U/L — ABNORMAL HIGH (ref 38–126)
Anion gap: 7 (ref 5–15)
BUN: 29 mg/dL — ABNORMAL HIGH (ref 8–23)
CO2: 32 mmol/L (ref 22–32)
Calcium: 7.6 mg/dL — ABNORMAL LOW (ref 8.9–10.3)
Chloride: 86 mmol/L — ABNORMAL LOW (ref 98–111)
Creatinine, Ser: 0.37 mg/dL — ABNORMAL LOW (ref 0.44–1.00)
GFR, Estimated: >60 mL/min (ref >60)
Glucose, Bld: 107 mg/dL — ABNORMAL HIGH (ref 70–99)
Potassium: 3.2 mmol/L — ABNORMAL LOW (ref 3.5–5.1)
Sodium: 125 mmol/L — ABNORMAL LOW (ref 135–145)
Total Bilirubin: 1.2 mg/dL (ref 0.0–1.2)
Total Protein: 5.2 g/dL — ABNORMAL LOW (ref 6.5–8.1)

## 2023-05-27 MED ORDER — BISACODYL 10 MG RE SUPP: 10.0000 mg | Freq: Every day | RECTAL | Status: DC | PRN

## 2023-05-27 MED ORDER — SODIUM CHLORIDE 0.9 % IV SOLN: INTRAVENOUS | Status: AC

## 2023-05-27 MED ORDER — ORAL CARE MOUTH RINSE: 15.0000 mL | OROMUCOSAL | Status: DC | PRN

## 2023-05-27 MED ORDER — POLYETHYLENE GLYCOL 3350 17 G PO PACK
17.0000 g | PACK | Freq: Every day | ORAL | Status: DC
  Administered 2023-05-27 – 2023-05-28 (×2): 17 g via ORAL
  Filled 2023-05-27 (×2): qty 1

## 2023-05-27 MED ORDER — SENNA 8.6 MG PO TABS
2.0000 | ORAL_TABLET | Freq: Every day | ORAL | Status: DC
  Administered 2023-05-27: 17.2 mg via ORAL
  Filled 2023-05-27: qty 2

## 2023-05-27 MED ORDER — CHLORHEXIDINE GLUCONATE CLOTH 2 % EX PADS
6.0000 | MEDICATED_PAD | Freq: Every day | CUTANEOUS | Status: DC
  Administered 2023-05-27 – 2023-05-28 (×2): 6 via TOPICAL

## 2023-05-27 MED ORDER — SODIUM CHLORIDE 1 G PO TABS
1.0000 g | ORAL_TABLET | Freq: Three times a day (TID) | ORAL | Status: DC
  Administered 2023-05-27 – 2023-05-28 (×4): 1 g via ORAL
  Filled 2023-05-27 (×4): qty 1

## 2023-05-27 MED ORDER — SODIUM CHLORIDE 0.9% FLUSH
10.0000 mL | Freq: Two times a day (BID) | INTRAVENOUS | Status: DC
  Administered 2023-05-27 – 2023-05-28 (×3): 10 mL

## 2023-05-27 NOTE — TOC Initial Note (Signed)
 Transition of Care [facility]) - Initial/Assessment Note    Patient Details  Name: Kathryn Underwood MRN: [id number] Date of Birth: Nov 18, 1960  [facility] CM/SW Contact:    Ruben Corolla, RN Phone Number: 05/27/2023, 4:06 PM  Clinical Narrative: Referral for home w/hospice-[facility] chosen by patient-await acceptance.                 Expected Discharge Plan: Home w Hospice Care Barriers to Discharge: Continued Medical Work up   Patient Goals and CMS Choice Patient states their goals for this hospitalization and ongoing recovery are:: Home w/hospice CMS Medicare.gov Compare Post Acute Care list provided to:: Patient Choice offered to / list presented to : Patient [facility] ownership interest in [facility]provided to:: Patient    Expected Discharge Plan and Services   Discharge Planning Services: CM Consult Post Acute Care Choice: Hospice Living arrangements for the past 2 months: Single Family Home                           HH Arranged: RN          Prior Living Arrangements/Services Living arrangements for the past 2 months: Single Family Home Lives with:: Significant Other                   Activities of Daily Living   ADL Screening (condition at time of admission) Independently performs ADLs?: Yes (appropriate for developmental age) Is the patient deaf or have difficulty hearing?: No Does the patient have difficulty seeing, even when wearing glasses/contacts?: No Does the patient have difficulty concentrating, remembering, or making decisions?: No  Permission Sought/Granted                  Emotional Assessment              Admission diagnosis:  Dehydration [E86.0] Arterial hypotension [I95.9] Hypokalemia [E87.6] Hyponatremia [E87.1] Malignant neoplasm of pancreas, unspecified location of malignancy (HCC) [C25.9] Hypotension due to hypovolemia [E86.1] Hypotension [I95.9] Patient Active Problem List   Diagnosis  Date Noted   Hypotension 05/27/2023   Arterial hypotension 05/26/2023   Grade I diastolic dysfunction 05/26/2023   Hypokalemia 05/26/2023   Hyperbilirubinemia 05/26/2023   Hypercarbia 05/26/2023   Normocytic anemia 05/26/2023   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) 05/06/2023   Genetic testing 03/22/2023   Overlapping malignant neoplasm of pancreas (HCC) 01/04/2023   Failure to thrive  (child) 01/04/2023   Gastric outlet obstruction 01/04/2023   Hyponatremia 01/03/2023   Dehydration 12/28/2022   SIRS (systemic inflammatory response syndrome) (HCC) 12/24/2022   Abdominal pain 12/23/2022   Pancreatic adenocarcinoma (HCC) 12/16/2022   Biliary obstruction 12/15/2022   Partial gastric outlet obstruction 12/15/2022   High serum carbohydrate antigen 19-9 (CA19-9) 12/12/2022   Pancreatic cyst 12/12/2022   Abnormal CT scan, gastrointestinal tract 12/11/2022   Acute esophagitis 12/10/2022   Acute on chronic pancreatitis (HCC) 12/09/2022   Protein malnutrition (HCC) 12/09/2022   Unintentional weight loss 12/09/2022   Esophageal dysphagia 12/09/2022   Dilation of pancreatic duct 12/09/2022   Protein-calorie malnutrition, severe 12/09/2022   Pancreatic mass 12/08/2022   Major depressive disorder, recurrent, 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   Multifocal atrial tachycardia (HCC) 08/31/2021   Delirium tremens (HCC) 07/27/2021   Pancreas hemorrhage 07/27/2021   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   Uterine fibroid 07/27/2021   COPD (chronic obstructive pulmonary disease) (HCC) 07/27/2021   Insomnia 07/27/2021   Pulmonary nodule 07/25/2021   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   Cannabis abuse 11/04/2017   Cocaine abuse (HCC) 11/04/2017   Alcohol induced acute pancreatitis without necrosis or infection 10/05/2017   Alcoholic ketoacidosis 10/04/2017   Epigastric pain 10/04/2017   Uncomplicated alcohol dependence (HCC) 03/02/2017   MDD (major depressive disorder), recurrent severe, without  psychosis (HCC) 04/28/2016   Alcohol withdrawal (HCC) 04/25/2016   Sinus tachycardia 04/25/2016   Transaminitis 04/25/2016   Leukocytosis 04/25/2016   Closed nondisplaced fracture of fifth right metatarsal bone 04/08/2016   Acute pain of right shoulder 04/08/2016   Vitamin D  deficiency 12/11/2015   Alcohol dependence with unspecified alcohol-induced disorder (HCC) 12/09/2015   Fatigue 12/09/2015   Encounter for alcohol abuse counseling and surveillance 12/09/2015   Cough 01/04/2015   Tobacco abuse 01/04/2015   Depression with anxiety 01/04/2015   PCP:  Alda Amas, PA-C Pharmacy:   [phone] - [place], [place] - [address] [place] [address] Phone: [phone] Fax: [phone]  [facility] [address] [place] [address] Phone: [phone] Fax: [phone]  [facility] [address] [place] [address] Phone: [phone] Fax: [phone]  [facility] [address] [place] [address] Phone: [phone] Fax: [phone]  [facility] [address] [place] [address] Phone: [phone] Fax: [phone]     Social Drivers of Health (SDOH) Social History: SDOH Screenings   Food Insecurity: No Food Insecurity (05/27/2023)  Housing: Low Risk  (05/27/2023)  Transportation Needs: No Transportation Needs (05/27/2023)  Utilities: Not At Risk (05/27/2023)  Depression (PHQ2-9): Medium Risk (01/21/2023)  Social Connections: Moderately Integrated (05/27/2023)  Tobacco Use: High Risk (05/26/2023)   SDOH Interventions:     Readmission Risk Interventions    12/25/2022   10:56 AM  Readmission Risk Prevention Plan  Transportation Screening Complete  PCP or Specialist Appt within 3-5 Days Complete  HRI or Home Care Consult  Complete  Social Work Consult for Recovery Care Planning/Counseling Complete  Palliative Care Screening Not Applicable  Medication Review Oceanographer) Referral to Pharmacy

## 2023-05-27 NOTE — Progress Notes (Signed)
 PROGRESS NOTE    Kathryn Underwood  ZOX:[id number] DOB: 1960/03/11 DOA: 05/26/2023 PCP: Alda Amas, PA-C   Brief Narrative:   63 y.o. 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  On presentation, white count was 21.6, sodium of 120.  She was started on IV fluids.  Patient stated that she did not want to live any longer and asked the nursing staff to provide her [place]: Psychiatry was consulted.  Assessment & Plan:   Hypotension -Blood pressure was low on presentation.  She is currently on IV fluids.  Blood pressure still on the lower side but stable.  Hyponatremia - Possibly from dehydration, poor oral intake.  Sodium 120 on presentation.  Sodium 125 today.  Will start salt tablets.  Monitor  Leukocytosis Improving.  No signs of infection  Anemia of chronic disease Macrocytosis - From cancer and chronic illnesses.  Hemoglobin stable.  Monitor intermittently.  No signs of bleeding  Hypokalemia -Replace.  Repeat a.m. labs  Pancreatic cancer status postchemotherapy, currently on outpatient radiation treatment (patient recently missed radiation sessions as an outpatient); treatment complicated by worsening jaundice needing PTC on 05/07/2023 Goals of care Severe protein calorie malnutrition Dehydration Failure to thrive /weight loss -Will follow further recommendation from oncology.  Palliative care consultation.  Dietitian consult.  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  Use bronchodilators if needed  Depression with anxiety Concern for suicidal ideation -continue mirtazapine  and quetiapine  - Await psychiatry recommendations   DVT prophylaxis: Eliquis  Code Status: DNR Family Communication: None at bedside Disposition Plan: Status is: Observation The patient will require care spanning > 2 midnights and should be moved to inpatient because: Of severity of illness.  Need for IV fluids.  Consultants: Oncology/palliative care/psychiatry  Procedures: None  Antimicrobials: None   Subjective: Patient seen and examined at bedside.  Still feels weak and tired.  No fever, vomiting, chest pain reported.  Objective: Vitals:   05/27/23 0247 05/27/23 0308 05/27/23 0643 05/27/23 1102  BP: 107/65  (!) 93/57 94/61  Pulse: 95  98   Resp: 20 18 16 16   Temp: 98.1 F (36.7 C)  97.7 F (36.5 C) 98.6 F (37 C)  TempSrc: Oral  Axillary Oral  SpO2: 97%  92% 96%  Weight:  44.6 kg    Height:        Intake/Output Summary (Last 24 hours) at 05/27/2023 1126 Last data filed at 05/27/2023 0700 Gross per 24 hour  Intake 2428.05 ml  Output 450 ml  Net 1978.05 ml   Filed Weights   05/26/23 1040 05/27/23 0308  Weight: 42.7 kg 44.6 kg    Examination:  General exam: Appears calm and comfortable.  Chronically ill and deconditioned looking.  On room air.  Extremely thinly built. Respiratory system: Bilateral decreased breath sounds at bases with scattered crackles Cardiovascular system: S1 & S2 heard, Rate controlled Gastrointestinal system: Abdomen is nondistended, soft and nontender. Normal bowel sounds heard.  Biliary drain in place. Extremities: No cyanosis, clubbing, edema  Central nervous system: Alert and oriented.  Slow to respond.  No focal neurological deficits. Moving extremities Skin: No rashes, lesions or ulcers Psychiatry: Flat affect.  Not agitated.   Data Reviewed: I have personally reviewed following labs and imaging  studies  CBC: Recent Labs  Lab 05/26/23 1047 05/27/23 0542  WBC 21.6* 18.0*  HGB 10.3* 8.6*  HCT 31.3* 27.0*  MCV 100.0 103.4*  PLT 258 213   Basic Metabolic Panel: Recent Labs  Lab 05/26/23 1047 05/26/23 1135 05/26/23 1745 05/27/23 0542  NA 120*  --  125* 125*  K 3.3*  --  3.6 3.2*  CL 70*  --  82* 86*  CO2 38*  --  33* 32  GLUCOSE 127*  --  147* 107*  BUN 45*  --  37* 29*  CREATININE 0.98  --  0.50 0.37*  CALCIUM 8.4*  --  8.1* 7.6*  MG  --  2.3  --   --    GFR: Estimated Creatinine Clearance: 50 mL/min (A) (by C-G formula based on SCr of 0.37 mg/dL (L)). Liver Function Tests: Recent Labs  Lab 05/26/23 1047 05/27/23 0542  AST 17 14*  ALT 12 10  ALKPHOS 163* 127*  BILITOT 1.6* 1.2  PROT 6.7 5.2*  ALBUMIN 2.9* 2.3*   No results for input(s): "LIPASE", "AMYLASE" in the last 168 hours. No results for input(s): "AMMONIA" in the last 168 hours. Coagulation Profile: No results for input(s): "INR", "PROTIME" in the last 168 hours. Cardiac Enzymes: No results for input(s): "CKTOTAL", "CKMB", "CKMBINDEX", "TROPONINI" in the last 168 hours. BNP (last 3 results) No results for input(s): "PROBNP" in the last 8760 hours. HbA1C: No results for input(s): "HGBA1C" in the last 72 hours. CBG: No results for input(s): "GLUCAP" in the last 168 hours. Lipid Profile: No results for input(s): "CHOL", "HDL", "LDLCALC", "TRIG", "CHOLHDL", "LDLDIRECT" in the last 72 hours. Thyroid  Function Tests: No results for input(s): "TSH", "T4TOTAL", "FREET4", "T3FREE", "THYROIDAB" in the last 72 hours. Anemia Panel: No results for input(s): "VITAMINB12", "FOLATE", "FERRITIN", "TIBC", "IRON ", "RETICCTPCT" in the last 72 hours. Sepsis Labs: No results for input(s): "PROCALCITON", "LATICACIDVEN" in the last 168 hours.  No results found for this or any previous visit (from the past 240 hours).       Radiology Studies: No results found.      Scheduled Meds:  apixaban   5 mg Oral  BID   Chlorhexidine  Gluconate Cloth  6 each Topical Daily   famotidine   20 mg Oral Daily   gabapentin   900 mg Oral QHS   mirtazapine   45 mg Oral QHS   morphine   30 mg Oral Q8H   polyethylene glycol  17 g Oral Daily   potassium chloride   40 mEq Oral BID   Ensure Max Protein  11 oz Oral BID   QUEtiapine   200 mg Oral QHS   senna  2 tablet Oral QHS   sodium chloride  flush  10-40 mL Intracatheter Q12H   sodium chloride   1 g Oral TID WC   Continuous Infusions:  sodium chloride  50 mL/hr at 05/27/23 0328          Audria Leather, MD [facility] 05/27/2023, 11:26 AM

## 2023-05-27 NOTE — IPAL (Signed)
 Goals of Care/ACP  Advanced Directives Documents (Living Will, Power of Attorney) currently in the EHR no advanced directives documents available .  Has the patient discussed their wishes with their family/healthcare power of attorney yes. How much does the family or healthcare power of attorney know about their wishes. Significant Other, Al Alias is patient's expressed medical decision maker. He was involved in goals of care discussions. Clear in his understanding with patient's wishes and in full support.   What does the patient/decision maker understand about their medical condition and the natural course of their disease. Incurable cancer, failure to thrive , hypotension, dehydration, decrease in quality of life as perceived by patient.   What is the patient/decision maker's biggest fear or concern for the future pain and suffering   What is the most important goal for this patient should their health condition worsen care focused on comfort . She is clear in her expressed wishes to treat the treatable while she remains hospitalized. Minimizing medications and less invasive interventions. She is currently undergoing palliative radiation. We discussed at length her wishes to continue versus discontinuing and focusing solely on her comfort. Patient and family would like to receive final radiation treatment on tomorrow and proceed with discharging home over the next 24-48 hours with hospice support. Kathryn Underwood states "I want to focus on my quality of life and eliminate any suffering. I am not living."   Patient expresses desire to receive hospice support in the home for as long as possible. Will discharge home with [facility] outpatient hospice support in the home.   Current   Code Status: Limited: Do not attempt resuscitation (DNR) -DNR-LIMITED -Do Not Intubate/DNI   Current code status has been reviewed/updated.  Time spent:30 Minutes

## 2023-05-27 NOTE — Consult Note (Signed)
 Psychiatric Consultation Note Reason for Consult: Suicidal ideation in the setting of terminal illness  Assessment: Patient seen and evaluated at bedside. Consult requested for suicidal ideations. Patient was alert, oriented, and able to engage openly with this provider. She appears frail and in mild physical discomfort, with multiple biliary drains noted and significant weakness.  Patient reports a profound sense of fatigue and emotional peace regarding her terminal diagnosis. She acknowledged passive thoughts of death but denied active suicidal intent or plan. She expressed a desire to come to terms with the fact that her chemotherapy and radiation treatments are no longer effective. She verbalized readiness to transition toward end-of-life care, citing fatigue from ongoing pain and suffering.  Psychiatric history is notable for isolated episodes of depression and alcohol use disorder, with no history of prior suicide attempts. Medical history includes pancreatic adenocarcinoma (diagnosed 11/2022), status post duodenal stent placement (01/06/2023), and current palliative systemic therapy, now deemed ineffective.  Intervention: Supportive psychotherapy was provided. Therapeutic techniques included positive reflection, emotional validation, and preparatory coaching for discussions with family and her long-term significant other of 39 years. Patient was counseled regarding the use of comfort medications, such as HAM protocol medications, commonly utilized in hospice care. She expressed no interest in psychiatric medications but was receptive to ongoing discussions about pain management and comfort-focused interventions.  Disposition: The patient is appropriate for hospice transition and has accepted the need for palliative care. Psychiatry has discussed the case with the primary and palliative care teams. No further psychiatric intervention is recommended at this time. Psychiatry signs off, with the  understanding that supportive services through hospice will continue.  I personally spent 35 minutes on the unit in direct patient care. The direct patient care time included face-to-face time with the patient, reviewing the patient's chart, communicating with other professionals, and coordinating care. Greater than 50% of this time was spent in counseling or coordinating care with the patient regarding goals of hospitalization, psycho-education, and discharge planning needs.

## 2023-05-27 NOTE — Consult Note (Signed)
 Palliative Care Consult Note                                  Date: 05/27/2023   Kathryn Underwood Name: Kathryn Underwood  DOB: 1960/08/13  MRN: [id number]  Age / Sex: 63 y.o., female  PCP: Allwardt, Deleta Felix, PA-C Referring Physician: Audria Leather, MD  Reason for Consultation: Establishing goals of care, Non pain symptom management, Pain control, and Psychosocial/spiritual support  HPI/Kathryn Underwood Profile: Palliative Care consult requested for goals of care discussion in this 63 y.o. female  with past medical history of pancreatic adenocarcinoma (11/2022) and is status post duodenal stent placement on 01/06/2023. Kathryn Underwood is not a surgical candidate and is currently on palliative systemic therapy of gemcitabine  and abraxane , in addition to radiation. She was [facility] on [facility]/2023 from the [facility] with hypotension.   Past Medical History:  Diagnosis Date   Alcohol abuse    Anxiety    Chickenpox    Depression    Neuromuscular disorder (HCC)    neuropathy   Pancreatic cancer (HCC)    Substance abuse (HCC)    Discussed the use of AI scribe software for clinical note transcription with the Kathryn Underwood, who gave verbal consent to proceed.   Subjective:   This NP Gardenia Jump reviewed medical records, received report from team, assessed the Kathryn Underwood and then met at the Kathryn Underwood's bedside with Kathryn Underwood and [place], RN to discuss diagnosis, prognosis, GOC, EOL wishes disposition and options. Later discussions with significant other, Al Alias once he arrived to the hospital.  Kathryn Underwood is awake and alert during my visit.  Appears comfortable in bed after recently receiving pain medication.  Kathryn Underwood is appropriate and able to engage in goals of care discussions independently.   Concept of Palliative Care was introduced as specialized medical care for people and their families living with serious illness.  It focuses on providing relief from the symptoms and stress  of a serious illness.  The goal is to improve quality of life for both the Kathryn Underwood and the family. Values and goals of care important to Kathryn Underwood and family were attempted to be elicited.  Kathryn Underwood is familiar with myself and RN as we have been closely following for symptom management and goals of care at [facility].  I created space and opportunity for Kathryn Underwood and Al Alias to explore state of health prior to admission, thoughts, and feelings.  Kathryn Underwood becomes tearful expressing ongoing pain which she does feel is well-managed with her current regimen.  Over the past week she has required more assistance with ADLs due to fatigue and weakness.  Spending more time in the bed.  Complains of occasional headache or dizziness when changing positions quickly.  We discussed her pain regimen at length. She experiences significant pain primarily located in the stomach and back, rated as a 'six' on a pain scale. Pain management includes oxycodone , with a recent dose of 20 mg at 9:50, and morphine  administered last night.  Since hospitalization she continues on home regimen to include gabapentin  900 mg at bedtime, MS Contin  30 mg every 8 hours and oxycodone  20 mg every 3 hours as needed for breakthrough pain.  Feels her current regimen is appropriate with no adjustments needed at this time.  Constipation is a concern, with several days since her last bowel movement.  MiraLAX  is available as needed.  Education provided on bowel regimen in the setting of  opioid use and decreased mobility to prevent further complications of constipation.  Will schedule MiraLAX  and senna at bedtime.  Dulcolax suppository as needed.  We discussed Her current illness and what it means in the larger context of Her on-going co-morbidities. Natural disease trajectory and expectations were discussed.  Kathryn Underwood and her significant other verbalized understanding of current illness and co-morbidities.  They are realistic in their  understanding and expectations.  She becomes emotional expressing her desires not to suffer.  States her current quality of life is null.  Al Alias has verbalized his agreement as well as observation of Kathryn Underwood's ongoing decline specifically increasing fatigue and weakness.  He wishes to support Kathryn Underwood in any way that he can allowing her wishes to be carried out.  During our discussion he is tearful expressing he is at peace with the decisions as he too has no desire for Kathryn Underwood to suffer for what time she has left.  Emotional support provided.  Kathryn Underwood confirms wishes for DNR/DNI.  She is clear in her expressed wishes to treat the treatable while she remains hospitalized.  Minimizing medications and less invasive interventions.  She is currently undergoing palliative radiation.  We discussed at length her wishes to continue versus discontinuing and focusing solely on her comfort.  Kathryn Underwood and family would like to receive final radiation treatment on tomorrow and proceed with discharging home over the next 24-48 hours with hospice support.  Kathryn Underwood states "I want to focus on my quality of life and eliminate any suffering.  I am not living."  Emotional support provided.  I discussed the importance of continued conversation with family and their medical providers regarding overall plan of care and treatment options, ensuring decisions are within the context of the patients values and GOCs.  Questions and concerns were addressed.  he Kathryn Underwood and family was encouraged to call with questions or concerns.  PMT will continue to support holistically as needed.   Objective:   Primary Diagnoses: Present on Admission:  Arterial hypotension  Pancreatic adenocarcinoma (HCC)  Hyponatremia  Depression with anxiety  COPD (chronic obstructive pulmonary disease) (HCC)  Thrombosis of splenic artery (HCC)  MDD (major depressive disorder), recurrent severe, without psychosis (HCC)  Hypokalemia  Hyperbilirubinemia   Hypercarbia  Protein-calorie malnutrition, severe  Leukocytosis  Normocytic anemia   Scheduled Meds:  apixaban   5 mg Oral BID   Chlorhexidine  Gluconate Cloth  6 each Topical Daily   famotidine   20 mg Oral Daily   gabapentin   900 mg Oral QHS   mirtazapine   45 mg Oral QHS   morphine   30 mg Oral Q8H   potassium chloride   40 mEq Oral BID   Ensure Max Protein  11 oz Oral BID   QUEtiapine   200 mg Oral QHS   sodium chloride  flush  10-40 mL Intracatheter Q12H   sodium chloride   1 g Oral TID WC    Continuous Infusions:  sodium chloride  50 mL/hr at 05/27/23 0328    PRN Meds: acetaminophen  **OR** acetaminophen , ondansetron  **OR** ondansetron  (ZOFRAN ) IV, mouth rinse, oxyCODONE , polyethylene glycol, prochlorperazine   No Known Allergies  Review of Systems Unless otherwise noted, a complete review of systems is negative.  Physical Exam General: NAD, frail chronically-ill appearing Cardiovascular: regular rate and rhythm Pulmonary:diminished bilaterally  Abdomen: Firm, tender, distended, + bowel sounds, biliary drains in place Extremities: no edema, no joint deformities Skin: no rashes, warm and dry Neurological: AAO x 3  Vital Signs:  BP (!) 93/57 (BP Location: Right Arm)  Pulse 98   Temp 97.7 F (36.5 C) (Axillary)   Resp 16   Ht 5\' 3"  (1.6 m)   Wt 98 lb 5.2 oz (44.6 kg)   SpO2 92%   BMI 17.42 kg/m  Pain Scale: 0-10 POSS *See Group Information*: 1-Acceptable,Awake and alert Pain Score: 3   SpO2: SpO2: 92 % O2 Device:SpO2: 92 % O2 Flow Rate: .   IO: Intake/output summary:  Intake/Output Summary (Last 24 hours) at 05/27/2023 0908 Last data filed at 05/27/2023 0700 Gross per 24 hour  Intake 2428.05 ml  Output 450 ml  Net 1978.05 ml    LBM: Last BM Date : 05/25/23 (per Kathryn Underwood, new admit) Baseline Weight: Weight: 94 lb 2 oz (42.7 kg) Most recent weight: Weight: 98 lb 5.2 oz (44.6 kg)      Palliative Assessment/Data: PPS 30-40%   Advanced Care Planning:    Primary Decision Maker: Kathryn Underwood  Code Status/Advance Care Planning: DNR  A discussion was had today regarding advanced directives. Concepts specific to code status, artifical feeding and hydration, continued IV antibiotics and rehospitalization was had.  The difference between a aggressive medical intervention path and a palliative comfort care path was discussed.  Kathryn Underwood confirms wishes for DNR/DNI.  Wishes to focus on her comfort for what time she has left and aggressively manage her symptoms to eliminate suffering.  She is open to continue to treat the treatable while hospitalized.  Request to complete last radiation treatment tomorrow.  Hospice  services outpatient were explained and offered. Kathryn Underwood and family verbalized their understanding and awareness of both palliative and hospice's goals and philosophy of care.  Education provided on what hospice services would look like in the home including symptom management in addition to inpatient hospice unit if needed.  Kathryn Underwood expresses desire to receive hospice support in the home for as long as possible.  Significant other states he has previously been in contact with [facility] to inquire about services in the future.  They share personal experiences with [facility].  Requesting referral be placed with [facility] if possible.  Education provided on the referral process and neck steps.  Significant other states plans to begin preparing the home for Kathryn Underwood to return over the next 24-48 hours.  Expresses hopes of Kathryn Underwood discharging home by Saturday morning to watch [place] in event they would go to annually.  Assessment & Plan:   SUMMARY OF RECOMMENDATIONS   DNR/DNI-as confirmed by Kathryn Underwood and significant other. Continue with current plan of care.  Request for final radiation treatment to be administered on Friday.  Goal is to focus on comfort for what time Kathryn Underwood has left. Extensive goals of care discussions.  Kathryn Underwood and  significant other, Al Alias are realistic in their understanding and expectations.  Kathryn Underwood is clear in her expressed wishes to aggressively manage her symptoms and minimize any suffering as much as possible.  Her wishes are to return home in the next 24-48 hours with outpatient hospice support.  No DME needs identified at this time. Kathryn Underwood is scheduled for potential biliary drain exchange on tomorrow.  Have notified Dr. Nereida Banning and Ernestina Headland, [place] of Kathryn Underwood's admission. TOC referral for outpatient hospice.  Kathryn Underwood and family specifically requesting referral choice of [facility].  PMT will continue to support and follow as needed. Please call team line with urgent needs.  Symptom Management:  Cancer related pain MS Contin  30 mg every 8 hours Oxycodone  20 mg every 3 hours as needed for moderate to severe pain Gabapentin  900 mg at  bedtime Nausea Zofran  4 mg every 6 hours as needed Compazine  5 mg every 4 hours as needed Constipation MiraLAX  daily Senna S2 tablets at bedtime Dulcolax suppository as needed daily  Palliative Prophylaxis:  Bowel Regimen, Frequent Pain Assessment, Oral Care, and Turn Reposition  Additional Recommendations (Limitations, Scope, Preferences): Avoid Hospitalization, No Artificial Feeding, and DNR/DNI, comfort focused care while treating the treatable during hospitalization.  Final radiation treatment Friday 5/2.  Psycho-social/Spiritual:  Desire for further Chaplaincy support: yes Additional Recommendations: Education on Hospice  Prognosis:  < 6 weeks  Discharge Planning:  Home with Hospice   Discussed with: Dr. Garfield Jungling, PA, RN, and [facility] notification.    Kathryn Underwood and Significant Other, Al Alias expressed understanding and was in agreement with this plan.    Time Total: 90 min   Visit consisted of counseling and education dealing with the complex and emotionally intense issues of symptom management and palliative care in the setting of serious and potentially  life-threatening illness.  Signed by:  Dellia Ferguson, AGPCNP-BC [facility]   Phone: [phone] Pager: [phone] Amion: Cori Dials   Thank you for allowing the Palliative Medicine Team to assist in the care of this Kathryn Underwood. Please utilize secure chat with additional questions, if there is no response within 30 minutes please call the above phone number. Palliative Medicine Team providers are available by phone from 7am to 5pm daily and can be reached through the team cell phone.  Should this Kathryn Underwood require assistance outside of these hours, please call the Kathryn Underwood's attending physician.  *Please note that this is a verbal dictation therefore any spelling or grammatical errors are due to the "[employer]agon [employer] One" system interpretation.

## 2023-05-27 NOTE — Progress Notes (Signed)
 PT Cancellation Note  Patient Details Name: Kathryn Underwood MRN: [id number] DOB: 04-09-1960   Cancelled Treatment:    Reason Eval/Treat Not Completed: Other (comment). Noted palliative care consult ordered. RN reports pt transferring to [facility] with +1 assist. Will check back for PT eval as schedule permits.   Judyann Number PT, DPT 05/27/23, 4:13 PM

## 2023-05-27 NOTE — Progress Notes (Signed)
 Received patient via stretcher accompanied by ED RN, patient asleep upon arriving to the unit but easily arousable, patient upset and irritable that she was awaken from her sleep, patient was transferred to the bed with moderate assist, patient refuse bath and states the she feels cold and also patient refuse full skin assessment, sacral foam noted on her right hip to the buttocks area and patient refused dressing change on her abdominal incision. Bed locked and in low position,side rails upright. Call light given and within reach. Comfort measures provided.

## 2023-05-27 NOTE — Progress Notes (Signed)
 Kathryn Underwood   DOB:03/27/1960   WU#:[id number]   GNF#:[id number]  Medical oncology follow-up note  Subjective: Patient is well-known to me, under my care for her pancreatic cancer.  She is radiation.  She was admitted from our office yesterday for hypotension and failure to thrive .  She is very fatigued, with low appetite.  She has decided to go home with hospice.   Objective:  Vitals:   05/27/23 1102 05/27/23 2028  BP: 94/61 (!) 102/56  Pulse:    Resp: 16 16  Temp: 98.6 F (37 C) 99.3 F (37.4 C)  SpO2: 96% 92%    Body mass index is 17.42 kg/m.  Intake/Output Summary (Last 24 hours) at 05/27/2023 2314 Last data filed at 05/27/2023 1800 Gross per 24 hour  Intake 541.38 ml  Output 870 ml  Net -328.62 ml     Sclerae unicteric, cachectic appearing  Oropharynx clear  No peripheral adenopathy  Lungs clear -- no rales or rhonchi  Heart regular rate and rhythm  Abdomen slightly distended, with tenderness in the epigastric area  MSK no focal spinal tenderness, no peripheral edema  Neuro nonfocal   CBG (last 3)  No results for input(s): "GLUCAP" in the last 72 hours.   Labs:  Lab Results  Component Value Date   WBC 18.0 (H) 05/27/2023   HGB 8.6 (L) 05/27/2023   HCT 27.0 (L) 05/27/2023   MCV 103.4 (H) 05/27/2023   PLT 213 05/27/2023   NEUTROABS 15.8 (H) 05/19/2023     Urine Studies No results for input(s): "UHGB", "CRYS" in the last 72 hours.  Invalid input(s): "UACOL", "UAPR", "USPG", "UPH", "UTP", "UGL", "UKET", "UBIL", "UNIT", "UROB", "ULEU", "UEPI", "UWBC", "URBC", "UBAC", "CAST", "UCOM", "BILUA"  Basic Metabolic Panel: Recent Labs  Lab 05/26/23 1047 05/26/23 1135 05/26/23 1745 05/27/23 0542  NA 120*  --  125* 125*  K 3.3*  --  3.6 3.2*  CL 70*  --  82* 86*  CO2 38*  --  33* 32  GLUCOSE 127*  --  147* 107*  BUN 45*  --  37* 29*  CREATININE 0.98  --  0.50 0.37*  CALCIUM 8.4*  --  8.1* 7.6*  MG  --  2.3  --   --    GFR Estimated Creatinine Clearance:  50 mL/min (A) (by C-G formula based on SCr of 0.37 mg/dL (L)). Liver Function Tests: Recent Labs  Lab 05/26/23 1047 05/27/23 0542  AST 17 14*  ALT 12 10  ALKPHOS 163* 127*  BILITOT 1.6* 1.2  PROT 6.7 5.2*  ALBUMIN 2.9* 2.3*   No results for input(s): "LIPASE", "AMYLASE" in the last 168 hours. No results for input(s): "AMMONIA" in the last 168 hours. Coagulation profile No results for input(s): "INR", "PROTIME" in the last 168 hours.  CBC: Recent Labs  Lab 05/26/23 1047 05/27/23 0542  WBC 21.6* 18.0*  HGB 10.3* 8.6*  HCT 31.3* 27.0*  MCV 100.0 103.4*  PLT 258 213   Cardiac Enzymes: No results for input(s): "CKTOTAL", "CKMB", "CKMBINDEX", "TROPONINI" in the last 168 hours. BNP: Invalid input(s): "POCBNP" CBG: No results for input(s): "GLUCAP" in the last 168 hours. D-Dimer No results for input(s): "DDIMER" in the last 72 hours. Hgb A1c No results for input(s): "HGBA1C" in the last 72 hours. Lipid Profile No results for input(s): "CHOL", "HDL", "LDLCALC", "TRIG", "CHOLHDL", "LDLDIRECT" in the last 72 hours. Thyroid  function studies No results for input(s): "TSH", "T4TOTAL", "T3FREE", "THYROIDAB" in the last 72 hours.  Invalid input(s): "FREET3" Anemia  work up No results for input(s): "VITAMINB12", "FOLATE", "FERRITIN", "TIBC", "IRON ", "RETICCTPCT" in the last 72 hours. Microbiology No results found for this or any previous visit (from the past 240 hours).    Studies:  No results found.  Assessment: 63 y.o. female   Hypotension Hyponatremia Pancreatic cancer, unresectable, status post chemotherapy, currently on radiation Leukocytosis Anemia of chronic disease Failure to thrive  COPD  Plan:  - Patient's overall condition has declined rapidly in the past few weeks.  Unfortunately her ankle cancer is not curable, and she is not a candidate for more chemotherapy due to her poor performance status. - I think home hospice is a very reasonable decision.  Her  overall condition has declined since she started her radiation.  She wants to do 1 more treatment tomorrow morning, and cancel the rest of the radiation. - I supported her decision, and answered her questions.  4) expectancy is likely a few weeks to a few months.  I will be happy to be her attending when she is under hospice care if needed. - She will likely go home tomorrow with hospice.   Sonja Inman [place], MD 05/27/2023

## 2023-05-28 ENCOUNTER — Inpatient Hospital Stay (HOSPITAL_COMMUNITY)

## 2023-05-28 ENCOUNTER — Ambulatory Visit (HOSPITAL_COMMUNITY)

## 2023-05-28 ENCOUNTER — Ambulatory Visit
Admission: RE | Admit: 2023-05-28 | Discharge: 2023-05-28 | Disposition: A | Discharge status: Home / Self Care | Source: Ambulatory Visit | Attending: Radiation Oncology

## 2023-05-28 ENCOUNTER — Other Ambulatory Visit: Payer: Self-pay

## 2023-05-28 ENCOUNTER — Ambulatory Visit
Admission: RE | Admit: 2023-05-28 | Discharge: 2023-05-28 | Disposition: A | Discharge status: Home / Self Care | Source: Ambulatory Visit | Attending: Radiation Oncology | Admitting: Radiation Oncology

## 2023-05-28 ENCOUNTER — Other Ambulatory Visit (HOSPITAL_COMMUNITY)

## 2023-05-28 DIAGNOSIS — I959 Hypotension, unspecified: Secondary | ICD-10-CM | POA: Diagnosis not present

## 2023-05-28 HISTORY — PX: IR EXCHANGE BILIARY DRAIN: IMG6046

## 2023-05-28 LAB — CBC WITH DIFFERENTIAL/PLATELET
Abs Immature Granulocytes: 0.18 10*3/uL — ABNORMAL HIGH (ref 0.00–0.07)
Basophils Absolute: 0.1 10*3/uL (ref 0.0–0.1)
Basophils Relative: 0 %
Eosinophils Absolute: 0.1 10*3/uL (ref 0.0–0.5)
Eosinophils Relative: 0 %
HCT: 26.6 % — ABNORMAL LOW (ref 36.0–46.0)
Hemoglobin: 8.4 g/dL — ABNORMAL LOW (ref 12.0–15.0)
Immature Granulocytes: 1 %
Lymphocytes Relative: 2 %
Lymphs Abs: 0.4 10*3/uL — ABNORMAL LOW (ref 0.7–4.0)
MCH: 32.7 pg (ref 26.0–34.0)
MCHC: 31.6 g/dL (ref 30.0–36.0)
MCV: 103.5 fL — ABNORMAL HIGH (ref 80.0–100.0)
Monocytes Absolute: 2 10*3/uL — ABNORMAL HIGH (ref 0.1–1.0)
Monocytes Relative: 11 %
Neutro Abs: 15.4 10*3/uL — ABNORMAL HIGH (ref 1.7–7.7)
Neutrophils Relative %: 86 %
Platelets: 197 10*3/uL (ref 150–400)
RBC: 2.57 MIL/uL — ABNORMAL LOW (ref 3.87–5.11)
RDW: 16.2 % — ABNORMAL HIGH (ref 11.5–15.5)
WBC: 18.2 10*3/uL — ABNORMAL HIGH (ref 4.0–10.5)
nRBC: 0 % (ref 0.0–0.2)

## 2023-05-28 LAB — COMPREHENSIVE METABOLIC PANEL WITH GFR
ALT: 11 U/L (ref 0–44)
AST: 13 U/L — ABNORMAL LOW (ref 15–41)
Albumin: 2.3 g/dL — ABNORMAL LOW (ref 3.5–5.0)
Alkaline Phosphatase: 116 U/L (ref 38–126)
Anion gap: 9 (ref 5–15)
BUN: 21 mg/dL (ref 8–23)
CO2: 34 mmol/L — ABNORMAL HIGH (ref 22–32)
Calcium: 7.8 mg/dL — ABNORMAL LOW (ref 8.9–10.3)
Chloride: 86 mmol/L — ABNORMAL LOW (ref 98–111)
Creatinine, Ser: 0.39 mg/dL — ABNORMAL LOW (ref 0.44–1.00)
GFR, Estimated: >60 mL/min (ref >60)
Glucose, Bld: 107 mg/dL — ABNORMAL HIGH (ref 70–99)
Potassium: 3 mmol/L — ABNORMAL LOW (ref 3.5–5.1)
Sodium: 129 mmol/L — ABNORMAL LOW (ref 135–145)
Total Bilirubin: 1.1 mg/dL (ref 0.0–1.2)
Total Protein: 5.2 g/dL — ABNORMAL LOW (ref 6.5–8.1)

## 2023-05-28 LAB — RAD ONC ARIA SESSION SUMMARY
Course Elapsed Days: 32
Plan Fractions Treated to Date: 23
Plan Prescribed Dose Per Fraction: 1.8 Gy
Plan Total Fractions Prescribed: 25
Plan Total Prescribed Dose: 45 Gy
Reference Point Dosage Given to Date: 41.4 Gy
Reference Point Session Dosage Given: 1.8 Gy
Session Number: 23

## 2023-05-28 LAB — MAGNESIUM: Magnesium: 2 mg/dL (ref 1.7–2.4)

## 2023-05-28 MED ORDER — GABAPENTIN 300 MG PO CAPS: 900.0000 mg | ORAL_CAPSULE | Freq: Every day | ORAL | Status: DC

## 2023-05-28 MED ORDER — HEPARIN SOD (PORK) LOCK FLUSH 100 UNIT/ML IV SOLN
500.0000 [IU] | INTRAVENOUS | Status: AC | PRN
  Administered 2023-05-28: 500 [IU]

## 2023-05-28 MED ORDER — ALUM & MAG HYDROXIDE-SIMETH 200-200-20 MG/5ML PO SUSP: 30.0000 mL | ORAL | 0 refills | Status: DC | PRN

## 2023-05-28 MED ORDER — SODIUM CHLORIDE 1 G PO TABS: 1.0000 g | ORAL_TABLET | Freq: Three times a day (TID) | ORAL | 0 refills | Status: DC

## 2023-05-28 MED ORDER — IOHEXOL 300 MG/ML  SOLN
50.0000 mL | Freq: Once | INTRAMUSCULAR | Status: AC | PRN
  Administered 2023-05-28: 25 mL

## 2023-05-28 MED ORDER — ALUM & MAG HYDROXIDE-SIMETH 200-200-20 MG/5ML PO SUSP
30.0000 mL | ORAL | Status: DC | PRN
  Administered 2023-05-28 (×3): 30 mL via ORAL
  Filled 2023-05-28 (×3): qty 30

## 2023-05-28 MED ORDER — MILK AND MOLASSES ENEMA
1.0000 | Freq: Once | RECTAL | Status: AC
  Administered 2023-05-28: 240 mL via RECTAL
  Filled 2023-05-28: qty 240

## 2023-05-28 MED ORDER — POTASSIUM CHLORIDE CRYS ER 20 MEQ PO TBCR
40.0000 meq | EXTENDED_RELEASE_TABLET | ORAL | Status: AC
  Administered 2023-05-28 (×2): 40 meq via ORAL
  Filled 2023-05-28 (×2): qty 2

## 2023-05-28 MED ORDER — POTASSIUM CHLORIDE CRYS ER 20 MEQ PO TBCR: 40.0000 meq | EXTENDED_RELEASE_TABLET | Freq: Every day | ORAL | 0 refills | Status: DC

## 2023-05-28 MED ORDER — LIDOCAINE HCL 1 % IJ SOLN
INTRAMUSCULAR | Status: AC
  Filled 2023-05-28: qty 20

## 2023-05-28 MED ORDER — SENNA 8.6 MG PO TABS: 1.0000 | ORAL_TABLET | Freq: Two times a day (BID) | ORAL | 0 refills | Status: DC

## 2023-05-28 MED ORDER — LIDOCAINE HCL 1 % IJ SOLN
20.0000 mL | Freq: Once | INTRAMUSCULAR | Status: AC
  Administered 2023-05-28: 10 mL via INTRADERMAL

## 2023-05-28 NOTE — TOC Progression Note (Signed)
 Transition of Care [facility]) - Progression Note    Patient Details  Name: Kathryn Underwood MRN: [id number] Date of Birth: [birth year]  Transition [facility]) CM/SW Contact  Alfrieda Tarry, Thersia Flax, RN Phone Number: 05/28/2023, 10:53 AM  Clinical Narrative:  Awaiting outcome of eval w/[facility] rep Shawn for acceptance.Family has no dme needs,has own transport home. No further CM needs.     Expected Discharge Plan: Home w Hospice Care Barriers to Discharge: Continued Medical Work up  Expected Discharge Plan and Services   Discharge Planning Services: CM Consult Post Acute Care Choice: Hospice Living arrangements for the past 2 months: Single Family Home                           HH Arranged: RN           Social Determinants of Health (SDOH) Interventions SDOH Screenings   Food Insecurity: No Food Insecurity (05/27/2023)  Housing: Low Risk  (05/27/2023)  Transportation Needs: No Transportation Needs (05/27/2023)  Utilities: Not At Risk (05/27/2023)  Depression (PHQ2-9): Medium Risk (01/21/2023)  Social Connections: Moderately Integrated (05/27/2023)  Tobacco Use: High Risk (05/26/2023)    Readmission Risk Interventions    12/25/2022   10:56 AM  Readmission Risk Prevention Plan  Transportation Screening Complete  PCP or Specialist Appt within 3-5 Days Complete  HRI or Home Care Consult Complete  Social Work Consult for Recovery Care Planning/Counseling Complete  Palliative Care Screening Not Applicable  Medication Review Oceanographer) Referral to Pharmacy

## 2023-05-28 NOTE — TOC Transition Note (Signed)
 Transition of Care [facility]) - Discharge Note   Patient Details  Name: Kathryn Underwood MRN: [id number] Date of Birth: 1960/08/20  Transition of Care [facility]) CM/SW Contact:  Ruben Corolla, RN Phone Number: 05/28/2023, 3:44 PM   Clinical Narrative:  Home w/expected home w/hospice-rep Shawn to discus w/family once at home. No further CM needs.     Final next level of care: Home w Hospice Care Barriers to Discharge: No Barriers Identified   Patient Goals and CMS Choice Patient states their goals for this hospitalization and ongoing recovery are:: Home CMS Medicare.gov Compare Post Acute Care list provided to:: Patient Choice offered to / list presented to : Patient [place] ownership interest in [facility].provided to:: Patient    Discharge Placement                       Discharge Plan and Services Additional resources added to the After Visit Summary for     Discharge Planning Services: CM Consult Post Acute Care Choice: Hospice                    HH Arranged: RN          Social Drivers of Health (SDOH) Interventions SDOH Screenings   Food Insecurity: No Food Insecurity (05/27/2023)  Housing: Low Risk  (05/27/2023)  Transportation Needs: No Transportation Needs (05/27/2023)  Utilities: Not At Risk (05/27/2023)  Depression (PHQ2-9): Medium Risk (01/21/2023)  Social Connections: Moderately Integrated (05/27/2023)  Tobacco Use: High Risk (05/26/2023)     Readmission Risk Interventions    12/25/2022   10:56 AM  Readmission Risk Prevention Plan  Transportation Screening Complete  PCP or Specialist Appt within 3-5 Days Complete  HRI or Home Care Consult Complete  Social Work Consult for Recovery Care Planning/Counseling Complete  Palliative Care Screening Not Applicable  Medication Review Oceanographer) Referral to Pharmacy

## 2023-05-28 NOTE — Discharge Summary (Signed)
 Physician Discharge Summary  Kathryn Underwood NWG:[id number] DOB: 07/17/1960 DOA: 05/26/2023  PCP: Alda Amas, PA-C  Admit date: 05/26/2023 Discharge date: 05/28/2023  Admitted From: Home Disposition: Home with hospice  Recommendations for Outpatient Follow-up:  Follow up with home hospice at earliest convenience    Home Health: No Equipment/Devices: None  Discharge Condition: Guarded to poor  CODE STATUS: DNR Diet recommendation: As tolerated  Brief/Interim Summary: 63 y.o. 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  On presentation, white count was 21.6, sodium of 120.  She was started on IV fluids.  Patient stated that she did not want to live any longer and asked the nursing staff to provide her with a gun: Psychiatry was consulted.  During the hospitalization, she was evaluated by palliative care, psychiatry and oncology.  Patient has opted to pursue home hospice.  This has been arranged.  Patient wanted to receive 1 last dose of radiation today along with exchange of biliary drain prior to discharge home.  She has received her radiation this morning.  She is supposed to have biliary drain exchanged by IR later this afternoon and after that patient will be discharged home with home hospice.  Prognosis is very poor.  Discharge Diagnoses:   Hypotension -Blood pressure was low on presentation.  Treated with IV  fluids.  Blood pressure still on the lower side but stable.   Hyponatremia - Possibly from  dehydration, poor oral intake.  Sodium 120 on presentation.  Sodium 129 today.  Started on tablets.  Continue salt tablets on discharge.  Leukocytosis -Still significant.  No signs of infection   Anemia of chronic disease Macrocytosis - From cancer and chronic illnesses.  Hemoglobin stable.  No signs of bleeding   Hypokalemia - Continue replacement on discharge   Pancreatic cancer status postchemotherapy, currently on outpatient radiation treatment (patient recently missed radiation sessions as an outpatient); treatment complicated by worsening jaundice needing PTC on 05/07/2023 Goals of care Severe protein calorie malnutrition Dehydration Failure to thrive /weight loss Chronic cancer-related pain with opiate dependence - Palliative care and oncology evaluation appreciated. -Patient has opted to pursue home hospice.  This has been arranged.  Patient wanted to receive 1 last dose of radiation today along with exchange of biliary drain prior to discharge home.  She has received her radiation this morning.  She is supposed to have biliary drain exchanged by IR later this afternoon and after that patient will be discharged home with home hospice.  Prognosis is very poor.  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    Depression with anxiety Concern for suicidal ideation -continue mirtazapine  and quetiapine  - Psychiatry evaluation appreciated.  Home hospice is being pursued because overall prognosis is very poor.  Discharge Instructions   Allergies as of 05/28/2023   No Known Allergies      Medication List     TAKE these medications    acetaminophen  500 MG tablet Commonly known as: TYLENOL  Take 500-1,000 mg by mouth every 6 (six) hours as needed for moderate pain (pain score 4-6).   alum & mag  hydroxide-simeth 200-200-20 MG/5ML suspension Commonly known as: MAALOX/MYLANTA Take 30  mLs by mouth every 4 (four) hours as needed for indigestion or heartburn.   Eliquis  5 MG Tabs tablet Generic drug: apixaban  TAKE 1 TABLET BY MOUTH TWICE A DAY   Ensure Max Protein Liqd Take 330 mLs (11 oz total) by mouth 2 (two) times daily.   famotidine  20 MG tablet Commonly known as: PEPCID  Take 1 tablet (20 mg total) by mouth 2 (two) times daily.   furosemide  20 MG tablet Commonly known as: LASIX  TAKE 1 TABLET (20 MG TOTAL) BY MOUTH DAILY. FOR 5 DAYS THEN AS NEEDED FOR LEG EDEMA   gabapentin  300 MG capsule Commonly known as: NEURONTIN  Take 3 capsules (900 mg total) by mouth at bedtime.   lidocaine -prilocaine  cream Commonly known as: EMLA  Apply to affected area once   mirtazapine  45 MG tablet Commonly known as: REMERON  Take 1 tablet (45 mg total) by mouth at bedtime.   morphine  30 MG 12 hr tablet Commonly known as: MS CONTIN  Take 1 tablet (30 mg total) by mouth every 8 (eight) hours.   MULTIVITAMIN ADULT PO Take 1 tablet by mouth daily.   Normal Saline Flush 0.9 % Soln Use 5 - 10 mLs to flush abdominal drain once daily as directed   ondansetron  8 MG tablet Commonly known as: Zofran  Take 1 tablet (8 mg total) by mouth every 8 (eight) hours as needed for nausea or vomiting.   Oxycodone  HCl 20 MG Tabs Take 1 tablet (20 mg total) by mouth every 3 (three) hours as needed. What changed: when to take this   polyethylene glycol 17 g packet Commonly known as: MIRALAX  / GLYCOLAX  Take 17 g by mouth daily as needed for moderate constipation.   potassium chloride  SA 20 MEQ tablet Commonly known as: KLOR-CON  M Take 2 tablets (40 mEq total) by mouth daily. What changed:  medication strength how much to take additional instructions   prochlorperazine  10 MG tablet Commonly known as: COMPAZINE  Take 1 tablet (10 mg total) by mouth every 6 (six) hours as needed for nausea or vomiting.   QUEtiapine  100 MG tablet Commonly known as: SEROQUEL  Take 2 tablets (200 mg total)  by mouth at bedtime.   senna 8.6 MG Tabs tablet Commonly known as: SENOKOT Take 1 tablet (8.6 mg total) by mouth 2 (two) times daily.   sodium chloride  1 g tablet Take 1 tablet (1 g total) by mouth 3 (three) times daily with meals.   Vitamin D  (Ergocalciferol ) 1.25 MG (50000 UNIT) Caps capsule Commonly known as: DRISDOL  Take 1 capsule (50,000 Units total) by mouth every 7 (seven) days. What changed: additional instructions        No Known Allergies  Consultations: Oncology/palliative care/psychiatry/IR   Procedures/Studies: IR PATIENT EVAL TECH 0-60 MINS Result Date: 05/18/2023 Adam Holm     05/18/2023  5:05 PM Allred, Boyd Cabal, PA-C Physician Assistant Radiology  Progress Notes   Signed  Date of Service: 05/18/2023  4:30 PM  Signed   Patient ID: PRECIOSA OHR, female   DOB: [birth year], 63 y.o.   MRN: [id number] Pt seen in IR dept today for evaluation of some leaking around biliary drain and need for dressing change. Pt has both GB and biliary drains in place.  The internal/external drain was placed on 05/07/2023.  On exam both drains are intact, sutures intact.  There is a small amount of drainage noted from the internal/external biliary drain insertion site.  Both drains were flushed  without difficulty .  Significant reflux from insertion sites were not noted with biliary drain or GB drain flushes.  New gauze dressings were applied over both drains.  Patient would benefit from home health assistance with drain maintenance.  She is currently flushing both drains once daily.  Patient has appointment with Dr. Maryalice Smaller tomorrow and follow-up cholangiogram/ drain exchange scheduled with IR on 05/28/2022.       IR INT EXT BILIARY DRAIN WITH CHOLANGIOGRAM Result Date: 05/07/2023 INDICATION: Pancreatic carcinoma and progressive biliary obstruction. EXAM: INTERNAL/EXTERNAL BILIARY DRAINAGE CATHETER PLACEMENT WITH CHOLANGIOGRAM MEDICATIONS: 2 g IV cefoxitin ; The antibiotic was administered  within an appropriate time frame prior to the initiation of the procedure. ANESTHESIA/SEDATION: Moderate (conscious) sedation was employed during this procedure. A total of Versed  2.0 mg and Fentanyl  75 mcg was administered intravenously by the radiology nurse. Total intra-service moderate Sedation Time: 31 minutes. The patient's level of consciousness and vital signs were monitored continuously by radiology nursing throughout the procedure under my direct supervision. FLUOROSCOPY: Radiation Exposure Index (as provided by the fluoroscopic device): 2.0 mGy Kerma CONTRAST:  20 mL Omnipaque  300 COMPLICATIONS: None immediate. PROCEDURE: Informed written consent was obtained from the patient after a thorough discussion of the procedural risks, benefits and alternatives. All questions were addressed. Maximal Sterile Barrier Technique was utilized including caps, mask, sterile gowns, sterile gloves, sterile drape, hand hygiene and skin antiseptic. Local anesthesia was provided with 1% lidocaine . The abdominal wall was prepped with chlorhexidine . A timeout was performed prior to the initiation of the procedure. Ultrasound was used to localize the liver and bile ducts. Under direct ultrasound guidance, an inferior right lobe peripheral intrahepatic bile duct was punctured with a 21 gauge needle. After stab wishing needle access, a cholangiogram was performed with injection of contrast material via the needle. Fluoroscopic image was saved. A guidewire was advanced via the needle into central bile ducts. A transitional dilator was advanced over the wire. The dilator was removed over a guidewire. A 5 French Kumpe catheter was advanced into the common bile duct. Additional contrast injection was performed. The catheter was further advanced over a hydrophilic guidewire through the common bile duct and into the duodenal lumen. The percutaneous tract was dilated to 10 [place]. A 10 French internal/external biliary drainage catheter  was then advanced over the wire. The distal portion was formed in the duodenum. The biliary drain was injected with contrast and fluoroscopic image saved to confirm drain position. The drain was then secured at the skin with a Prolene retention suture and StatLock device. It was attached to gravity bag drainage. FINDINGS: Cholangiogram confirms a high-grade biliary obstruction with massively dilated intrahepatic bile ducts and common bile duct. After right lobe peripheral biliary access, a 5 French catheter met relative obstruction at the level of the mid common bile duct just below the cystic duct orifice. 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 An internal/external biliary drainage catheter was able to be placed which is formed in the duodenum and with sideholes extending into the right intrahepatic ducts allowing both internal and external biliary drainage. IMPRESSION: High-grade biliary obstruction involving the mid to distal common bile duct. The obstruction was able to be crossed after right lobe peripheral biliary access allowing placement of a 10 French internal/external biliary drainage catheter which is formed in the duodenum and with sideholes extending into right-sided intrahepatic bile ducts. This will be left currently  to gravity bag drainage to maximize biliary decompression. Electronically Signed   By: Erica Hau M.D.   On: 05/07/2023 17:22   CT CHEST ABDOMEN PELVIS W CONTRAST Result Date: 05/04/2023 CLINICAL DATA:  Pancreatic cancer. Chemotherapy and radiation therapy ongoing. Duodenal stent. Biliary drain. * Tracking Code: BO * EXAM: CT CHEST, ABDOMEN, AND PELVIS WITH CONTRAST TECHNIQUE: Multidetector CT imaging of the chest, abdomen and pelvis was performed following the standard protocol during bolus administration of intravenous contrast. RADIATION DOSE REDUCTION: This  exam was performed according to the departmental dose-optimization program which includes automated exposure control, adjustment of the mA and/or kV according to patient size and/or use of iterative reconstruction technique. CONTRAST:  75 mL Omnipaque  COMPARISON:  Outside CT 03/16/2023 FINDINGS: CT CHEST FINDINGS Cardiovascular: Port in the anterior chest wall with tip in distal SVC. No significant vascular findings. Normal heart size. No pericardial effusion. Mediastinum/Nodes: No axillary or supraclavicular adenopathy. No mediastinal or hilar adenopathy. No pericardial fluid. Esophagus normal. Lungs/Pleura: Irregular nodule in the LEFT upper lobe measures 7 mm (image 31/3) compared to 6 mm on CT 12/17/2022. The nodule appears slightly more thickened. No new pulmonary nodules. No pleural fluid. Musculoskeletal: No aggressive osseous lesion. CT ABDOMEN AND PELVIS FINDINGS Hepatobiliary: There is moderate intra and extra extrahepatic biliary duct dilatation which is similar to CT 03/16/2023. Common hepatic duct measures 15 mm (62/2) compared to 14 mm. Intrahepatic duct on the LEFT measures 11 mm (image 54/2) compared to 11 mm. Common bile duct measures 9 mm similar prior. Biliary drain is position more superficial than comparison exam. The pigtail is coiled along the capsule of the RIGHT hepatic lobe where previously coil was positioned more centrally in the gallbladder fossa. Pancreas: Fluid collection in the body of the pancreas measures 4.63.6 cm decreased from 5.6 x 4.4 cm. Pancreatic mass is difficult to define. Spleen: Normal spleen Adrenals/urinary tract: Adrenal glands and kidneys are normal. The ureters and bladder normal. Stomach/Bowel: Stomach contains oral contrast. Duodenal stent is noted. Contrast flows through the stent into the small bowel. No obstruction. Colon is normal. Vascular/Lymphatic: Abdominal aorta is normal caliber with atherosclerotic calcification. There is no retroperitoneal or  periportal lymphadenopathy. No pelvic lymphadenopathy. Reproductive: Fibroid uterus. Other: No intraperitoneal free fluid.  No peritoneal nodularity. Musculoskeletal: No aggressive osseous lesion. IMPRESSION: 1. Moderate intra and extrahepatic biliary duct dilatation similar to comparison CT 03/16/2023. 2. Percutaneous biliary drain is position more superficial than comparison exam along the capsule the liver. 3. No ascites. 4. Duodenal stent appears patent and unchanged. 5. Fluid collection along the body the pancreas is decreased in volume. Pancreatic mass poorly defined. 6. No peritoneal metastasis identified. 7. Irregular nodule in the LEFT upper lobe is stable. Recommend attention on follow-up. Electronically Signed   By: Deboraha Fallow M.D.   On: 05/04/2023 11:58   IR CHOLANGIOGRAM EXISTING TUBE Result Date: 04/29/2023 CLINICAL DATA:  increasing LFT's Receipt, 63 year old female with a history of pancreatic adenocarcinoma with biliary obstruction. Percutaneous cholecystostomy placed 12/13/2022, most recently exchanged 04/16/2023. Recent tug, with concern for catheter dislodgement. EXAM: CHOLANGIOGRAM VIA EXISTING CATHETER COMPARISON:  IR fluoroscopy, 04/16/2023.  CT AP, 03/16/2023. CONTRAST:  10 mL Omnipaque  300-administered via the existing percutaneous drain. FLUOROSCOPY TIME:  Fluoroscopic dose; 1 mGy TECHNIQUE: The patient was positioned supine on the fluoroscopy table. A preprocedural spot fluoroscopic image was obtained of the RIGHT upper quadrant and the existing percutaneous drainage catheter. Multiple spot fluoroscopic and radiographic images were obtained following the injection of a small amount of  contrast via the existing percutaneous drainage catheter. FINDINGS: Stable positioning of RIGHT upper quadrant percutaneous cholecystostomy tube. Contrast injection demonstrated a normal functioning with opacification of the decompressed gallbladder. IMPRESSION: Stable positioning of well-functioning  percutaneous cholecystostomy tube. RECOMMENDATIONS: The patient will return to Vascular Interventional Radiology (VIR) for routine drainage catheter evaluation and exchange scheduled for 05/28/2023. Art Largo, MD [facility] Radiology Electronically Signed   By: Art Largo M.D.   On: 04/29/2023 17:18      Subjective: Patient seen and examined at bedside.  Complains of intermittent abdominal pain.  Feels okay to go home today.  Discharge Exam: Vitals:   05/27/23 2028 05/28/23 0618  BP: (!) 102/56 (!) 92/54  Pulse:  100  Resp: 16 16  Temp: 99.3 F (37.4 C) 98.8 F (37.1 C)  SpO2: 92% 95%    General: Pt is alert, awake, not in acute distress.  Looks chronically ill and deconditioned. Cardiovascular: rate controlled, S1/S2 + Respiratory: bilateral decreased breath sounds at bases Abdominal: Soft, NT, ND, bowel sounds +.  Biliary drain in place. Extremities: no edema, no cyanosis    The results of significant diagnostics from this hospitalization (including imaging, microbiology, ancillary and laboratory) are listed below for reference.     Microbiology: No results found for this or any previous visit (from the past 240 hours).   Labs: BNP (last 3 results) No results for input(s): "BNP" in the last 8760 hours. Basic Metabolic Panel: Recent Labs  Lab 05/26/23 1047 05/26/23 1135 05/26/23 1745 05/27/23 0542 05/28/23 0249  NA 120*  --  125* 125* 129*  K 3.3*  --  3.6 3.2* 3.0*  CL 70*  --  82* 86* 86*  CO2 38*  --  33* 32 34*  GLUCOSE 127*  --  147* 107* 107*  BUN 45*  --  37* 29* 21  CREATININE 0.98  --  0.50 0.37* 0.39*  CALCIUM 8.4*  --  8.1* 7.6* 7.8*  MG  --  2.3  --   --  2.0   Liver Function Tests: Recent Labs  Lab 05/26/23 1047 05/27/23 0542 05/28/23 0249  AST 17 14* 13*  ALT 12 10 11   ALKPHOS 163* 127* 116  BILITOT 1.6* 1.2 1.1  PROT 6.7 5.2* 5.2*  ALBUMIN 2.9* 2.3* 2.3*   No results for input(s):  "LIPASE", "AMYLASE" in the last 168 hours. No results for input(s): "AMMONIA" in the last 168 hours. CBC: Recent Labs  Lab 05/26/23 1047 05/27/23 0542 05/28/23 0249  WBC 21.6* 18.0* 18.2*  NEUTROABS  --   --  15.4*  HGB 10.3* 8.6* 8.4*  HCT 31.3* 27.0* 26.6*  MCV 100.0 103.4* 103.5*  PLT 258 213 197   Cardiac Enzymes: No results for input(s): "CKTOTAL", "CKMB", "CKMBINDEX", "TROPONINI" in the last 168 hours. BNP: Invalid input(s): "POCBNP" CBG: No results for input(s): "GLUCAP" in the last 168 hours. D-Dimer No results for input(s): "DDIMER" in the last 72 hours. Hgb A1c No results for input(s): "HGBA1C" in the last 72 hours. Lipid Profile No results for input(s): "CHOL", "HDL", "LDLCALC", "TRIG", "CHOLHDL", "LDLDIRECT" in the last 72 hours. Thyroid  function studies No results for input(s): "TSH", "T4TOTAL", "T3FREE", "THYROIDAB" in the last 72 hours.  Invalid input(s): "FREET3" Anemia work up No results for input(s): "VITAMINB12", "FOLATE", "FERRITIN", "TIBC", "IRON ", "RETICCTPCT" in the last 72 hours. Urinalysis    Component Value Date/Time   COLORURINE YELLOW 05/26/2023 1337   APPEARANCEUR CLEAR 05/26/2023 1337   LABSPEC 1.011 05/26/2023  1337   PHURINE 6.0 05/26/2023 1337   GLUCOSEU NEGATIVE 05/26/2023 1337   HGBUR NEGATIVE 05/26/2023 1337   BILIRUBINUR NEGATIVE 05/26/2023 1337   KETONESUR NEGATIVE 05/26/2023 1337   PROTEINUR NEGATIVE 05/26/2023 1337   UROBILINOGEN 0.2 01/23/2010 1750   NITRITE NEGATIVE 05/26/2023 1337   LEUKOCYTESUR NEGATIVE 05/26/2023 1337   Sepsis Labs Recent Labs  Lab 05/26/23 1047 05/27/23 0542 05/28/23 0249  WBC 21.6* 18.0* 18.2*   Microbiology No results found for this or any previous visit (from the past 240 hours).   Time coordinating discharge: 35 minutes  SIGNED:   Audria Leather, MD  [facility] 05/28/2023, 11:10 AM

## 2023-05-28 NOTE — Progress Notes (Signed)
 Physical Therapy Discharge Patient Details Name: Kathryn Underwood MRN: [id number] DOB: 1961/01/24 Today's Date: 05/28/2023 Time:  -     Patient discharged from PT services secondary to medical decline -Patient  will discharge home with Hospice. Abelina Hoes PT Acute Rehabilitation Services Office [phone]     GP     Dareen Ebbing 05/28/2023, 6:58 AM

## 2023-05-31 ENCOUNTER — Other Ambulatory Visit: Payer: Self-pay

## 2023-05-31 ENCOUNTER — Ambulatory Visit

## 2023-06-01 ENCOUNTER — Encounter: Payer: Self-pay | Admitting: Radiation Oncology

## 2023-06-01 ENCOUNTER — Ambulatory Visit

## 2023-06-02 ENCOUNTER — Ambulatory Visit

## 2023-06-02 ENCOUNTER — Other Ambulatory Visit

## 2023-06-03 ENCOUNTER — Ambulatory Visit

## 2023-06-04 ENCOUNTER — Ambulatory Visit

## 2023-06-27 NOTE — Radiation Completion Notes (Signed)
 Patient Name: Kathryn Underwood, Kathryn Underwood MRN: [id number] Date of Birth: 01/29/1960 Referring Physician: Sonja Canon, M.D. Date of Service: 2023-06-08 Radiation Oncologist: Johna Myers, M.D. [facility]                             RADIATION ONCOLOGY END OF TREATMENT NOTE     Diagnosis: C25.8 Malignant neoplasm of overlapping sites of pancreas Staging on 2022-12-12: Pancreatic adenocarcinoma (HCC) T=cT3, N=cN0, M=cM0 Intent: Curative     ==========DELIVERED PLANS==========  First Treatment Date: 2023-04-23 Last Treatment Date: 2023-05-28   Plan Name: Pancreas Site: Pancreas Technique: IMRT Mode: Photon Dose Per Fraction: 1.8 Gy Prescribed Dose (Delivered / Prescribed): 41.4 Gy / 45 Gy Prescribed Fxs (Delivered / Prescribed): 23 / 25     ==========ON TREATMENT VISIT DATES========== 2023-04-30, 2023-05-10, 2023-05-14, 2023-05-21, 2023-05-28     ==========UPCOMING VISITS==========       ==========APPENDIX - ON TREATMENT VISIT NOTES==========   See weekly On Treatment Notes in Epic for details in the Media tab (listed as Progress notes on the On Treatment Visit Dates listed above).

## 2023-06-27 NOTE — Progress Notes (Signed)
  Radiation Oncology         [phone] ________________________________  Name: Kathryn Underwood MRN: [id number]  Date: 06/27/23  DOB: 10-Aug-1960  End of Treatment Note  Diagnosis:   Locally advanced pancreatic adenocarcinoma involving the head and neck.      Indication for treatment:   curative       Radiation treatment dates:   04/26/23-05/28/23  Site/planned dose:   The pancreatic target was to be treated to 45 Gy in 25 fractions and boost of 5.4 Gy in 3 fractions, however she completed 23/25 fractions totaling 41.4 Gy, and did not receive the boost phase.  Narrative: The patient's status declined during her course of radiation, and her radiotherapy was discontinued after 23 treatments.  Plan: The patient subsequently enrolled in home  hospice care and passed away on 06/27/23. We will be available to her family and care team if there are questions about her case.     Shelvia Dick, PAC

## 2023-06-27 DEATH — deceased

## 2023-08-10 ENCOUNTER — Other Ambulatory Visit: Payer: Self-pay

## 2023-11-09 NOTE — Progress Notes (Signed)
 This encounter was created in error - please disregard.
